# Patient Record
Sex: Male | Born: 1945 | Race: White | Hispanic: No | Marital: Married | Smoking: Former smoker
Health system: Southern US, Community
[De-identification: ages and names within clinical notes are randomized; demographics above are authoritative.]

## PROBLEM LIST (undated history)

## (undated) DIAGNOSIS — Z8719 Personal history of other diseases of the digestive system: Secondary | ICD-10-CM

## (undated) DIAGNOSIS — I714 Abdominal aortic aneurysm, without rupture, unspecified: Secondary | ICD-10-CM

## (undated) DIAGNOSIS — H269 Unspecified cataract: Secondary | ICD-10-CM

## (undated) DIAGNOSIS — Z8639 Personal history of other endocrine, nutritional and metabolic disease: Secondary | ICD-10-CM

## (undated) DIAGNOSIS — R972 Elevated prostate specific antigen [PSA]: Secondary | ICD-10-CM

## (undated) DIAGNOSIS — M159 Polyosteoarthritis, unspecified: Secondary | ICD-10-CM

## (undated) DIAGNOSIS — K219 Gastro-esophageal reflux disease without esophagitis: Secondary | ICD-10-CM

## (undated) DIAGNOSIS — T7840XA Allergy, unspecified, initial encounter: Secondary | ICD-10-CM

## (undated) DIAGNOSIS — I10 Essential (primary) hypertension: Secondary | ICD-10-CM

## (undated) DIAGNOSIS — E785 Hyperlipidemia, unspecified: Secondary | ICD-10-CM

## (undated) DIAGNOSIS — Z9289 Personal history of other medical treatment: Secondary | ICD-10-CM

## (undated) DIAGNOSIS — H409 Unspecified glaucoma: Secondary | ICD-10-CM

## (undated) DIAGNOSIS — D649 Anemia, unspecified: Secondary | ICD-10-CM

## (undated) DIAGNOSIS — I2510 Atherosclerotic heart disease of native coronary artery without angina pectoris: Secondary | ICD-10-CM

## (undated) HISTORY — PX: EYE SURGERY: SHX253

## (undated) HISTORY — PX: CARDIAC CATHETERIZATION: SHX172

## (undated) HISTORY — PX: APPENDECTOMY: SHX54

## (undated) HISTORY — PX: JOINT REPLACEMENT: SHX530

## (undated) HISTORY — PX: COLONOSCOPY: SHX174

## (undated) HISTORY — PX: SHOULDER ARTHROSCOPY: SHX128

## (undated) HISTORY — PX: FRACTURE SURGERY: SHX138

## (undated) HISTORY — DX: Hyperlipidemia, unspecified: E78.5

## (undated) HISTORY — DX: Essential (primary) hypertension: I10

## (undated) HISTORY — PX: SHOULDER ARTHROSCOPY W/ ROTATOR CUFF REPAIR: SHX2400

## (undated) HISTORY — PX: BACK SURGERY: SHX140

## (undated) HISTORY — DX: Unspecified glaucoma: H40.9

## (undated) HISTORY — PX: VASECTOMY: SHX75

## (undated) HISTORY — PX: WISDOM TOOTH EXTRACTION: SHX21

## (undated) HISTORY — PX: SPINE SURGERY: SHX786

## (undated) HISTORY — DX: Unspecified cataract: H26.9

## (undated) HISTORY — DX: Allergy, unspecified, initial encounter: T78.40XA

## (undated) HISTORY — DX: Polyosteoarthritis, unspecified: M15.9

---
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
## 1995-08-16 HISTORY — PX: CERVICAL LAMINECTOMY: SHX94

## 2002-03-25 ENCOUNTER — Ambulatory Visit (HOSPITAL_BASED_OUTPATIENT_CLINIC_OR_DEPARTMENT_OTHER): Admission: RE | Admit: 2002-03-25 | Discharge: 2002-03-25 | Payer: Self-pay | Admitting: Orthopedic Surgery

## 2006-10-08 LAB — HM COLONOSCOPY

## 2008-02-01 ENCOUNTER — Encounter: Payer: Self-pay | Admitting: Family Medicine

## 2008-09-15 HISTORY — PX: ORIF FIBULA FRACTURE: SHX5114

## 2008-09-18 ENCOUNTER — Inpatient Hospital Stay (HOSPITAL_COMMUNITY): Admission: EM | Admit: 2008-09-18 | Discharge: 2008-09-22 | Payer: Self-pay | Admitting: Emergency Medicine

## 2008-10-15 ENCOUNTER — Ambulatory Visit (HOSPITAL_BASED_OUTPATIENT_CLINIC_OR_DEPARTMENT_OTHER): Admission: RE | Admit: 2008-10-15 | Discharge: 2008-10-15 | Payer: Self-pay | Admitting: Plastic Surgery

## 2009-01-26 DIAGNOSIS — E785 Hyperlipidemia, unspecified: Secondary | ICD-10-CM

## 2009-01-26 DIAGNOSIS — I10 Essential (primary) hypertension: Secondary | ICD-10-CM

## 2009-01-26 HISTORY — DX: Hyperlipidemia, unspecified: E78.5

## 2009-01-26 HISTORY — DX: Essential (primary) hypertension: I10

## 2009-03-05 ENCOUNTER — Ambulatory Visit: Payer: Self-pay | Admitting: Family Medicine

## 2009-03-05 LAB — CONVERTED CEMR LAB
ALT: 18 units/L (ref 0–53)
AST: 27 units/L (ref 0–37)
Albumin: 4 g/dL (ref 3.5–5.2)
Alkaline Phosphatase: 109 units/L (ref 39–117)
BUN: 14 mg/dL (ref 6–23)
Basophils Absolute: 0 10*3/uL (ref 0.0–0.1)
Basophils Relative: 0.2 % (ref 0.0–3.0)
Bilirubin Urine: NEGATIVE
Bilirubin, Direct: 0.1 mg/dL (ref 0.0–0.3)
Blood in Urine, dipstick: NEGATIVE
CO2: 28 meq/L (ref 19–32)
Calcium: 9.1 mg/dL (ref 8.4–10.5)
Chloride: 113 meq/L — ABNORMAL HIGH (ref 96–112)
Cholesterol: 178 mg/dL (ref 0–200)
Creatinine, Ser: 1 mg/dL (ref 0.4–1.5)
Eosinophils Absolute: 0.1 10*3/uL (ref 0.0–0.7)
Eosinophils Relative: 1 % (ref 0.0–5.0)
GFR calc non Af Amer: 80.23 mL/min (ref >60)
Glucose, Bld: 114 mg/dL — ABNORMAL HIGH (ref 70–99)
Glucose, Urine, Semiquant: NEGATIVE
HCT: 46.4 % (ref 39.0–52.0)
HDL: 42.2 mg/dL (ref >39.00)
Hemoglobin: 15.9 g/dL (ref 13.0–17.0)
LDL Cholesterol: 113 mg/dL — ABNORMAL HIGH (ref 0–99)
Lymphocytes Relative: 25.3 % (ref 12.0–46.0)
Lymphs Abs: 2.1 10*3/uL (ref 0.7–4.0)
MCHC: 34.4 g/dL (ref 30.0–36.0)
MCV: 90.7 fL (ref 78.0–100.0)
Monocytes Absolute: 0.7 10*3/uL (ref 0.1–1.0)
Monocytes Relative: 8 % (ref 3.0–12.0)
Neutro Abs: 5.3 10*3/uL (ref 1.4–7.7)
Neutrophils Relative %: 65.5 % (ref 43.0–77.0)
Nitrite: NEGATIVE
PSA: 2.01 ng/mL (ref 0.10–4.00)
Platelets: 210 10*3/uL (ref 150.0–400.0)
Potassium: 4.3 meq/L (ref 3.5–5.1)
Protein, U semiquant: NEGATIVE
RBC: 5.11 M/uL (ref 4.22–5.81)
RDW: 13 % (ref 11.5–14.6)
Sodium: 145 meq/L (ref 135–145)
Specific Gravity, Urine: 1.025
TSH: 0.57 microintl units/mL (ref 0.35–5.50)
Total Bilirubin: 0.7 mg/dL (ref 0.3–1.2)
Total CHOL/HDL Ratio: 4
Total Protein: 7.1 g/dL (ref 6.0–8.3)
Triglycerides: 115 mg/dL (ref 0.0–149.0)
Urobilinogen, UA: 0.2
VLDL: 23 mg/dL (ref 0.0–40.0)
WBC Urine, dipstick: NEGATIVE
WBC: 8.2 10*3/uL (ref 4.5–10.5)
pH: 5

## 2009-03-12 ENCOUNTER — Ambulatory Visit: Payer: Self-pay | Admitting: Family Medicine

## 2009-06-11 ENCOUNTER — Ambulatory Visit: Payer: Self-pay | Admitting: Family Medicine

## 2009-06-11 DIAGNOSIS — J019 Acute sinusitis, unspecified: Secondary | ICD-10-CM | POA: Insufficient documentation

## 2009-11-16 ENCOUNTER — Telehealth: Payer: Self-pay | Admitting: Family Medicine

## 2010-01-06 ENCOUNTER — Ambulatory Visit: Payer: Self-pay | Admitting: Family Medicine

## 2010-01-06 DIAGNOSIS — M159 Polyosteoarthritis, unspecified: Secondary | ICD-10-CM

## 2010-01-06 HISTORY — DX: Polyosteoarthritis, unspecified: M15.9

## 2010-03-11 ENCOUNTER — Ambulatory Visit: Payer: Self-pay | Admitting: Family Medicine

## 2010-03-11 DIAGNOSIS — J209 Acute bronchitis, unspecified: Secondary | ICD-10-CM | POA: Insufficient documentation

## 2010-03-19 ENCOUNTER — Ambulatory Visit: Payer: Self-pay | Admitting: Family Medicine

## 2010-03-19 LAB — CONVERTED CEMR LAB
ALT: 29 units/L (ref 0–53)
AST: 30 units/L (ref 0–37)
Albumin: 4.4 g/dL (ref 3.5–5.2)
Alkaline Phosphatase: 83 units/L (ref 39–117)
BUN: 15 mg/dL (ref 6–23)
Basophils Absolute: 0.1 10*3/uL (ref 0.0–0.1)
Basophils Relative: 0.9 % (ref 0.0–3.0)
Bilirubin Urine: NEGATIVE
Bilirubin, Direct: 0.1 mg/dL (ref 0.0–0.3)
Blood in Urine, dipstick: NEGATIVE
CO2: 31 meq/L (ref 19–32)
Calcium: 9.7 mg/dL (ref 8.4–10.5)
Chloride: 108 meq/L (ref 96–112)
Cholesterol: 187 mg/dL (ref 0–200)
Creatinine, Ser: 0.9 mg/dL (ref 0.4–1.5)
Eosinophils Absolute: 0.1 10*3/uL (ref 0.0–0.7)
Eosinophils Relative: 1.3 % (ref 0.0–5.0)
GFR calc non Af Amer: 91.47 mL/min (ref >60)
Glucose, Bld: 114 mg/dL — ABNORMAL HIGH (ref 70–99)
Glucose, Urine, Semiquant: NEGATIVE
HCT: 46.6 % (ref 39.0–52.0)
HDL: 41.1 mg/dL (ref >39.00)
Hemoglobin: 16 g/dL (ref 13.0–17.0)
Ketones, urine, test strip: NEGATIVE
LDL Cholesterol: 114 mg/dL — ABNORMAL HIGH (ref 0–99)
Lymphocytes Relative: 25.7 % (ref 12.0–46.0)
Lymphs Abs: 2.3 10*3/uL (ref 0.7–4.0)
MCHC: 34.4 g/dL (ref 30.0–36.0)
MCV: 93.3 fL (ref 78.0–100.0)
Monocytes Absolute: 0.7 10*3/uL (ref 0.1–1.0)
Monocytes Relative: 8 % (ref 3.0–12.0)
Neutro Abs: 5.6 10*3/uL (ref 1.4–7.7)
Neutrophils Relative %: 64.1 % (ref 43.0–77.0)
Nitrite: NEGATIVE
PSA: 2.78 ng/mL (ref 0.10–4.00)
Platelets: 231 10*3/uL (ref 150.0–400.0)
Potassium: 4.2 meq/L (ref 3.5–5.1)
Protein, U semiquant: NEGATIVE
RBC: 4.99 M/uL (ref 4.22–5.81)
RDW: 13.5 % (ref 11.5–14.6)
Sodium: 147 meq/L — ABNORMAL HIGH (ref 135–145)
Specific Gravity, Urine: 1.025
TSH: 0.79 microintl units/mL (ref 0.35–5.50)
Total Bilirubin: 0.7 mg/dL (ref 0.3–1.2)
Total CHOL/HDL Ratio: 5
Total Protein: 7 g/dL (ref 6.0–8.3)
Triglycerides: 162 mg/dL — ABNORMAL HIGH (ref 0.0–149.0)
Urobilinogen, UA: 0.2
VLDL: 32.4 mg/dL (ref 0.0–40.0)
WBC Urine, dipstick: NEGATIVE
WBC: 8.8 10*3/uL (ref 4.5–10.5)
pH: 5

## 2010-03-26 ENCOUNTER — Ambulatory Visit: Payer: Self-pay | Admitting: Family Medicine

## 2010-07-12 ENCOUNTER — Ambulatory Visit: Payer: Self-pay | Admitting: Family Medicine

## 2010-07-13 ENCOUNTER — Ambulatory Visit: Payer: Self-pay | Admitting: Family Medicine

## 2010-07-14 ENCOUNTER — Telehealth: Payer: Self-pay | Admitting: Family Medicine

## 2010-09-14 NOTE — Letter (Signed)
Summary: Records from [facility] 2009  Records from [facility] 2009   Imported By: Maryln Gottron 01/30/2009 09:26:40  _____________________________________________________________________  External Attachment:    Type:   Image     Comment:   External Document

## 2010-09-14 NOTE — Assessment & Plan Note (Signed)
Summary: severe wrist pain/dm   Vital Signs:  Patient profile:   65 year old male Weight:      207 pounds Temp:     98.3 degrees F oral BP sitting:   138 / 80  (left arm) Cuff size:   large  Vitals Entered By: Sid Falcon LPN (July 12, 2010 3:03 PM)  History of Present Illness: Patient seen with left wrist pain for several months now going back about August. No injury. Right-hand-dominant. Pain mostly along radial aspect of wrist with occasional swelling. Pain worse with use of hand. No repetitive activities. Occasional tingling sensation thumb and index finger. No volar wrist pain.  Pain is more a soreness.  Allergies: 1)  Penicillin V Potassium (Penicillin V Potassium) 2)  Steroids  Past History:  Past Medical History: Last updated: 01/26/2009 Hyperlipidemia Hypertension Pre-diabetes by past labs Chronic intermittent low back pain Slghtly atypical pigmented lesion left upper chest wall PMH reviewed for relevance  Physical Exam  General:  Well-developed,well-nourished,in no acute distress; alert,appropriate and cooperative throughout examination Lungs:  Normal respiratory effort, chest expands symmetrically. Lungs are clear to auscultation, no crackles or wheezes. Heart:  normal rate and regular rhythm.   Extremities:  patient has no visible swelling left wrist. He has some mild tenderness to palpation along the abductor tendon of left thumb. Full range of motion wrists. No navicular tenderness. Normal distal pulses. Does have small rounded ?cystic type swelling at distal end of radius. Neurologic:  full strength hand and throughout Upper extremity.   Impression & Recommendations:  Problem # 1:  WRIST PAIN (ICD-719.43) suspect tendonitis.  Given duration of symptoms rec x-rays.  No relief with NSAIDs and we offered corticosteroid injection. Risks and benefits discussed and pt consented to steroid injection of L abductor tendon sheath.  Inj 20 mg depomedrol using 25  gauge 5/8 needle without difficulty.  Pt will ice. Orders: T-Wrist Comp Left Min 3 Views (73110TC) Injection, Tendon / Ligament (02725)  Complete Medication List: 1)  Prinzide 20-12.5 Mg Tabs (Lisinopril-hydrochlorothiazide) .... 1/2 daily 2)  Zocor 80 Mg Tabs (Simvastatin) .... Once daily 3)  Aspirin 325 Mg Tabs (Aspirin) .... Once daily 4)  Oxycodone Hcl 5 Mg Tabs (Oxycodone hcl) .... One by mouth q4-6 hours as needed pain 5)  Soma 350 Mg Tabs (Carisoprodol) .... One by mouth q 8 hours as needed 6)  Tramadol Hcl 50 Mg Tabs (Tramadol hcl) .[name].. 1-2 by mouth q 6 hours as needed pain Prescriptions: OXYCODONE HCL 5 MG TABS (OXYCODONE HCL) one by mouth q4-6 hours as needed pain  #60 x 0   Entered and Authorized by:   Evelena Peat MD   Signed by:   Evelena Peat MD on 07/12/2010   Method used:   Print then Give to Patient   RxID:   [id number]    Orders Added: 1)  T-Wrist Comp Left Min 3 Views [73110TC] 2)  Injection, Tendon / Ligament [20550]   Immunization History:  Influenza Immunization History:    Influenza:  historical (06/15/2010)   Immunization History:  Influenza Immunization History:    Influenza:  Historical (06/15/2010)

## 2010-09-14 NOTE — Assessment & Plan Note (Signed)
Summary: ARTHRITIS CONCERNS / KNEE PAIN // RS   Vital Signs:  Patient profile:   65 year old male Temp:     98.7 degrees F Pulse rate:   72 / minute BP sitting:   130 / 90  History of Present Illness: Hx osteoarthritis. prior steroid injection has helped in past-hip injected by ortho.  Knee and hip pain mostly. Last Wed R knee sharp pain rolling over in bed.  Advil 8/day with some relief. Tylenol without relief.  Waking at night.   Hx ultram without relief.  Allergies: 1)  Penicillin V Potassium (Penicillin V Potassium) 2)  Steroids  Past History:  Past Medical History: Last updated: 01/26/2009 Hyperlipidemia Hypertension Pre-diabetes by past labs Chronic intermittent low back pain Slghtly atypical pigmented lesion left upper chest wall  Past Surgical History: Last updated: 03/12/2009 Appendectomy  age 59 Cervical laminectomy  1997 C3-4 Rotator cuff repair left 2004, right 2009 left spiral tib-fib fx ORIF, compartment syndrome PMH reviewed for relevance  Physical Exam  General:  Well-developed,well-nourished,in no acute distress; alert,appropriate and cooperative throughout examination Mouth:  Oral mucosa and oropharynx without lesions or exudates.  Teeth in good repair. Lungs:  Normal respiratory effort, chest expands symmetrically. Lungs are clear to auscultation, no crackles or wheezes. Heart:  Normal rate and regular rhythm. S1 and S2 normal without gallop, murmur, click, rub or other extra sounds. Extremities:  full ROM knees and hips.  No effusion, erythema, or warmth.   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  Rec exercises such as water or cycling.  Trial of Meloxicam 15 mg daily. His updated medication list for this problem includes:    Aspirin 325 Mg Tabs (Aspirin) ..... Once daily    Oxycodone Hcl 5 Mg Tabs (Oxycodone hcl) ..... One by mouth  q4-6 hours as needed pain    Meloxicam 15 Mg Tabs (Meloxicam) ..... One by mouth once daily  Complete Medication List: 1)  Prinzide 20-12.5 Mg Tabs (Lisinopril-hydrochlorothiazide) .... Once daily 2)  Zocor 80 Mg Tabs (Simvastatin) .... Once daily 3)  Aspirin 325 Mg Tabs (Aspirin) .... Once daily 4)  Oxycodone Hcl 5 Mg Tabs (Oxycodone hcl) .... One by mouth q4-6 hours as needed pain 5)  Soma 350 Mg Tabs (Carisoprodol) .... One by mouth q 8 hours as needed 6)  Azithromycin 250 Mg Tabs (Azithromycin) .... 2 by mouth today then one by mouth once daily for 4 days. 7)  Hydrocodone-homatropine 5-1.5 Mg/9ml Syrp (Hydrocodone-homatropine) .... One tsp by mouth q 4-6 hours as needed cough 8)  Meloxicam 15 Mg Tabs (Meloxicam) .... One by mouth once daily  Patient Instructions: 1)  Exercise with cycling or water exercises. Prescriptions: MELOXICAM 15 MG TABS (MELOXICAM) one by mouth once daily  #30 x 5   Entered and Authorized by:   Evelena Peat MD   Signed by:   Evelena Peat MD on 01/06/2010   Method used:   Electronically to        [facility]  [phone]* (retail)       [address] [place], [place]  [address]       Ph: [phone] or [phone]       Fax: [phone]   RxID:   [id number]

## 2010-09-14 NOTE — Assessment & Plan Note (Signed)
Summary: cpx//ccm   Vital Signs:  Patient profile:   65 year old male Height:      73 inches Weight:      216 pounds BMI:     28.60 Temp:     98.4 degrees F oral Pulse rate:   72 / minute Pulse rhythm:   regular Resp:     12 per minute BP sitting:   130 / 90  (left arm) Cuff size:   regular  Vitals Entered By: Sid Falcon LPN (March 12, 2009 8:58 AM) CC: Previous [place] pt, to establish, CPX, labs done   History of Present Illness: Patient seen to establish care. He has history of hypertension and hyperlipidemia. Complicated left lower extremity fracture in February after falling on the ice this was a spiral type fracture of the fibula and tibia and he developed a compartment syndrome.  He had multiple surgeries and has had slow recovery. He's had extensive rehabilitation. Meds are reviewed and he has been compliant with these. Since his injuries had some weight gain which he attributes to less activity.  Colonoscopy in 2008. Last tetanus 2005. Pneumovax was given February of this year. Patient also has history of chronic prediabetes from previous labs. He has some chronic arthritis problems and very infrequently takes Percocet and soma in the past and requests refill.  Allergies: 1)  Penicillin V Potassium (Penicillin V Potassium) 2)  Steroids  Past History:  Past Medical History: Last updated: 01/26/2009 Hyperlipidemia Hypertension Pre-diabetes by past labs Chronic intermittent low back pain Slghtly atypical pigmented lesion left upper chest wall  Family History: Last updated: 03/12/2009 Family History Hypertension Family History of Arthritis Rheumatoid sister Family History of Stroke F 1st degree relative <60  sister  Social History: Last updated: 03/12/2009 Ex-smoker Occupation: Married Alcohol use-no  Past Surgical History: Appendectomy  age 4 Cervical laminectomy  1997 C3-4 Rotator cuff repair left 2004, right 2009 left spiral tib-fib fx ORIF,  compartment syndrome  Family History: Family History Hypertension Family History of Arthritis Rheumatoid sister Family History of Stroke F 1st degree relative <60  sister  Social History: Ex-smoker Occupation: Married Alcohol use-no Occupation:  employed  Review of Systems       The patient complains of weight gain.  The patient denies anorexia, fever, weight loss, vision loss, decreased hearing, hoarseness, chest pain, syncope, dyspnea on exertion, peripheral edema, prolonged cough, headaches, hemoptysis, abdominal pain, melena, hematochezia, severe indigestion/heartburn, hematuria, incontinence, suspicious skin lesions, transient blindness, difficulty walking, depression, unusual weight change, abnormal bleeding, enlarged lymph nodes, and testicular masses.    Physical Exam  General:  Well-developed,well-nourished,in no acute distress; alert,appropriate and cooperative throughout examination Head:  Normocephalic and atraumatic without obvious abnormalities. No apparent alopecia or balding. Eyes:  No corneal or conjunctival inflammation noted. EOMI. Perrla. Funduscopic exam benign, without hemorrhages, exudates or papilledema. Vision grossly normal. Ears:  External ear exam shows no significant lesions or deformities.  Otoscopic examination reveals clear canals, tympanic membranes are intact bilaterally without bulging, retraction, inflammation or discharge. Hearing is grossly normal bilaterally. Mouth:  Oral mucosa and oropharynx without lesions or exudates.  Teeth in good repair. Neck:  No deformities, masses, or tenderness noted. Lungs:  Normal respiratory effort, chest expands symmetrically. Lungs are clear to auscultation, no crackles or wheezes. Heart:  Normal rate and regular rhythm. S1 and S2 normal without gallop, murmur, click, rub or other extra sounds. Abdomen:  Bowel sounds positive,abdomen soft and non-tender without masses, organomegaly or hernias noted. Rectal:  No  external abnormalities noted. Normal sphincter tone. No rectal masses or tenderness. Prostate:  Prostate gland firm and smooth, no enlargement, nodularity, tenderness, mass, asymmetry or induration. Extremities:  he has significant scars left lower extremity from prior surgery. He has some chronic edema left lower extremity compared to right since his surgery. No calf tenderness. Minimally decreased range of motion left ankle compared to right Neurologic:  No cranial nerve deficits noted. Station and gait are normal. Plantar reflexes are down-going bilaterally. DTRs are symmetrical throughout. Sensory, motor and coordinative functions appear intact. Skin:  Intact without suspicious lesions or rashes Psych:  Cognition and judgment appear intact. Alert and cooperative with normal attention span and concentration. No apparent delusions, illusions, hallucinations   Impression & Recommendations:  Problem # 1:  Preventive Health Care (ICD-V70.0) labs reviewed with patient. He has prediabetes. Lipids are slightly worse this year compared to last. Immunizations up to date. No prior history of zoster vaccine we discussed this and he is still considering. Work on weight loss and establish a more regular exercise. Refilled meds for 1 year.  Complete Medication List: 1)  Prinzide 20-12.5 Mg Tabs (Lisinopril-hydrochlorothiazide) .... Once daily 2)  Zocor 80 Mg Tabs (Simvastatin) .... Once daily 3)  Aspirin 325 Mg Tabs (Aspirin) .... Once daily 4)  Oxycodone Hcl 5 Mg Tabs (Oxycodone hcl) .... One by mouth q4-6 hours as needed pain 5)  Soma 350 Mg Tabs (Carisoprodol) .... One by mouth q 8 hours as needed  Patient Instructions: 1)  It is important that you exercise reguarly at least 20 minutes 5 times a week. If you develop chest pain, have severe difficulty breathing, or feel very tired, stop exercising immediately and seek medical attention.  2)  You need to lose weight. Consider a lower calorie diet and  regular exercise.  Prescriptions: SOMA 350 MG TABS (CARISOPRODOL) one by mouth q 8 hours as needed  #60 x 0   Entered and Authorized by:   Evelena Peat MD   Signed by:   Evelena Peat MD on 03/12/2009   Method used:   Print then Give to Patient   RxID:   [id number] OXYCODONE HCL 5 MG TABS (OXYCODONE HCL) one by mouth q4-6 hours as needed pain  #60 x 0   Entered and Authorized by:   Evelena Peat MD   Signed by:   Evelena Peat MD on 03/12/2009   Method used:   Print then Give to Patient   RxID:   [id number] ZOCOR 80 MG TABS (SIMVASTATIN) once daily  #90 x 3   Entered and Authorized by:   Evelena Peat MD   Signed by:   Evelena Peat MD on 03/12/2009   Method used:   Electronically to        [facility]* (mail-order)             ,          Ph: [phone]       Fax: [phone]   RxID:   [id number] PRINZIDE 20-12.5 MG TABS (LISINOPRIL-HYDROCHLOROTHIAZIDE) once daily  #90 x 3   Entered and Authorized by:   Evelena Peat MD   Signed by:   Evelena Peat MD on 03/12/2009   Method used:   Electronically to        [facility]* (mail-order)             ,          Ph: [phone]       Fax: [phone]  RxID:   [id number]

## 2010-09-14 NOTE — Assessment & Plan Note (Signed)
Summary: cpx//ccm   Vital Signs:  Patient profile:   65 year old male Height:      72 inches Weight:      207 pounds BMI:     28.18 Temp:     98.5 degrees F oral Pulse rate:   68 / minute Resp:     16 per minute BP sitting:   140 / 92  (left arm) Cuff size:   regular  Vitals Entered By: Duard Brady LPN (March 26, 2010 8:48 AM)  Nutrition Counseling: Patient's BMI is greater than 25 and therefore counseled on weight management options. CC: cpx - doing ok, Hypertension Management Is Patient Diabetic? No   History of Present Illness: Here for CPE.  PMH, SH, AND FH all reviewed. Colonoscopy and tetanus up to date. Undecided about Zostavax at this time.  Has osteoarthritis mostly hips.  Has not had very good control with Meloxicam or Tylenol. Rarely takes oxycodone.  Prior hx injection steroids by ortho without much improvement. Early AM stiffness.  No recent injury. Pain is moderate severity.  Would like to explore other options' for pain control.  Hypertension History:      He denies headache, chest pain, palpitations, dyspnea with exertion, orthopnea, PND, peripheral edema, visual symptoms, neurologic problems, and syncope.  Further comments include: BP well controlled by home readings.        Positive major cardiovascular risk factors include male age 35 years old or older, hyperlipidemia, and hypertension.  Negative major cardiovascular risk factors include non-tobacco-user status.     Preventive Screening-Counseling & Management  Alcohol-Tobacco     Smoking Status: never  Clinical Review Panels:  Prevention   Last Colonoscopy:  Done (09/16/2006)   Last PSA:  2.78 (03/19/2010)  Immunizations   Last Tetanus Booster:  Historical (08/16/2003)  Lipid Management   Cholesterol:  187 (03/19/2010)   LDL (bad choesterol):  114 (03/19/2010)   HDL (good cholesterol):  41.10 (03/19/2010)  Diabetes Management   Creatinine:  0.9 (03/19/2010)  CBC   WBC:  8.8  (03/19/2010)   RBC:  4.99 (03/19/2010)   Hgb:  16.0 (03/19/2010)   Hct:  46.6 (03/19/2010)   Platelets:  231.0 (03/19/2010)   MCV  93.3 (03/19/2010)   MCHC  34.4 (03/19/2010)   RDW  13.5 (03/19/2010)   PMN:  64.1 (03/19/2010)   Lymphs:  25.7 (03/19/2010)   Monos:  8.0 (03/19/2010)   Eosinophils:  1.3 (03/19/2010)   Basophil:  0.9 (03/19/2010)  Complete Metabolic Panel   Glucose:  114 (03/19/2010)   Sodium:  147 (03/19/2010)   Potassium:  4.2 (03/19/2010)   Chloride:  108 (03/19/2010)   CO2:  31 (03/19/2010)   BUN:  15 (03/19/2010)   Creatinine:  0.9 (03/19/2010)   Albumin:  4.4 (03/19/2010)   Total Protein:  7.0 (03/19/2010)   Calcium:  9.7 (03/19/2010)   Total Bili:  0.7 (03/19/2010)   Alk Phos:  83 (03/19/2010)   SGPT (ALT):  29 (03/19/2010)   SGOT (AST):  30 (03/19/2010)   Allergies: 1)  Penicillin V Potassium (Penicillin V Potassium) 2)  Steroids  Past History:  Past Medical History: Last updated: 01/26/2009 Hyperlipidemia Hypertension Pre-diabetes by past labs Chronic intermittent low back pain Slghtly atypical pigmented lesion left upper chest wall  Past Surgical History: Last updated: 03/12/2009 Appendectomy  age 41 Cervical laminectomy  1997 C3-4 Rotator cuff repair left 2004, right 2009 left spiral tib-fib fx ORIF, compartment syndrome  Family History: Last updated: 03/26/2010 Family History Hypertension parents,  sisters Family History of Arthritis Rheumatoid sister Family History of Stroke F 1st degree relative <60  sister  Social History: Last updated: 03/12/2009 Ex-smoker Occupation: Married Alcohol use-no  Risk Factors: Smoking Status: never (03/26/2010) PMH-FH-SH reviewed for relevance  Family History: Family History Hypertension parents, sisters Family History of Arthritis Rheumatoid sister Family History of Stroke F 1st degree relative <60  sister  Social History: Smoking Status:  never  Review of Systems  The patient  denies anorexia, fever, weight loss, weight gain, vision loss, decreased hearing, hoarseness, chest pain, syncope, dyspnea on exertion, peripheral edema, prolonged cough, headaches, hemoptysis, abdominal pain, melena, hematochezia, severe indigestion/heartburn, hematuria, incontinence, genital sores, muscle weakness, suspicious skin lesions, transient blindness, difficulty walking, depression, unusual weight change, enlarged lymph nodes, and testicular masses.     Physical Exam  General:  Well-developed,well-nourished,in no acute distress; alert,appropriate and cooperative throughout examination Head:  Normocephalic and atraumatic without obvious abnormalities. No apparent alopecia or balding. Eyes:  No corneal or conjunctival inflammation noted. EOMI. Perrla. Funduscopic exam benign, without hemorrhages, exudates or papilledema. Vision grossly normal. Ears:  External ear exam shows no significant lesions or deformities.  Otoscopic examination reveals clear canals, tympanic membranes are intact bilaterally without bulging, retraction, inflammation or discharge. Hearing is grossly normal bilaterally. Mouth:  Oral mucosa and oropharynx without lesions or exudates.  Teeth in good repair. Neck:  No deformities, masses, or tenderness noted. Lungs:  Normal respiratory effort, chest expands symmetrically. Lungs are clear to auscultation, no crackles or wheezes. Heart:  normal rate, regular rhythm, and no gallop.   Abdomen:  Bowel sounds positive,abdomen soft and non-tender without masses, organomegaly or hernias noted. Rectal:  No external abnormalities noted. Normal sphincter tone. No rectal masses or tenderness. Prostate:  Prostate gland firm and smooth, no enlargement, nodularity, tenderness, mass, asymmetry or induration. Extremities:  No clubbing, cyanosis, edema, or deformity noted with normal full range of motion of all joints.   Neurologic:  alert & oriented X3, cranial nerves II-XII intact, and  gait normal.   Skin:  Intact without suspicious lesions or rashes Cervical Nodes:  No lymphadenopathy noted Psych:  normally interactive, good eye contact, not anxious appearing, and not depressed appearing.     Impression & Recommendations:  Problem # 1:  ROUTINE GENERAL MEDICAL [email or web address]  CARE FACL (ICD-V70.0) Labs reviewed.  Consider Zostavax.  Pneumovax by next year.  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  Oxycodone refilled which he uses sparingly. His updated medication list for this problem includes:    Aspirin 325 Mg Tabs (Aspirin) ..... Once daily    Oxycodone Hcl 5 Mg Tabs (Oxycodone hcl) ..... One by mouth q4-6 hours as needed pain    Tramadol Hcl 50 Mg Tabs (Tramadol hcl) .[name]... 1-2 by mouth q 6 hours as needed pain  Problem # 3:  HYPERTENSION (ICD-401.9) Assessment: Unchanged refilled meds. His updated medication list for this problem includes:    Prinzide 20-12.5 Mg Tabs (Lisinopril-hydrochlorothiazide) .[name]... 1/2 daily  Complete Medication List: 1)  Prinzide 20-12.5 Mg Tabs (Lisinopril-hydrochlorothiazide) .... 1/2 daily 2)  Zocor 80 Mg Tabs (Simvastatin) .... Once daily 3)  Aspirin 325 Mg Tabs (Aspirin) .... Once daily 4)  Oxycodone Hcl 5 Mg Tabs (Oxycodone hcl) .... One by mouth q4-6 hours as needed pain 5)  Soma 350 Mg Tabs (Carisoprodol) .... One by mouth q 8 hours as needed 6)  Tramadol Hcl 50 Mg Tabs (Tramadol hcl) .[name].. 1-2 by mouth q 6 hours as needed pain  Hypertension Assessment/Plan:      The patient's hypertensive risk group is category B: At least one risk factor (excluding diabetes) with no target organ damage.  His calculated 10 year risk of coronary heart disease is 22 %.  Today's blood pressure is 140/92.    Patient Instructions: 1)  Please schedule a follow-up appointment in 6 months .  2)  It is important that you exercise reguarly at least 20 minutes 5 times a week. If you develop chest pain, have  severe difficulty breathing, or feel very tired, stop exercising immediately and seek medical attention.  Prescriptions: SOMA 350 MG TABS (CARISOPRODOL) one by mouth q 8 hours as needed  #60 x 0   Entered and Authorized by:   Evelena Peat MD   Signed by:   Evelena Peat MD on 03/26/2010   Method used:   Print then Give to Patient   RxID:   [id number] OXYCODONE HCL 5 MG TABS (OXYCODONE HCL) one by mouth q4-6 hours as needed pain  #60 x 0   Entered and Authorized by:   Evelena Peat MD   Signed by:   Evelena Peat MD on 03/26/2010   Method used:   Print then Give to Patient   RxID:   [id number] ZOCOR 80 MG TABS (SIMVASTATIN) once daily  #90 x 3   Entered and Authorized by:   Evelena Peat MD   Signed by:   Evelena Peat MD on 03/26/2010   Method used:   Faxed to ...       [facility] (mail-order)             , [place]         Ph:        Fax: [phone]   RxID:   [id number] PRINZIDE 20-12.5 MG TABS (LISINOPRIL-HYDROCHLOROTHIAZIDE) 1/2 daily  #90 x 3   Entered and Authorized by:   Evelena Peat MD   Signed by:   Evelena Peat MD on 03/26/2010   Method used:   Faxed to ...       [facility] (mail-order)             , [place]         Ph:        Fax: [phone]   RxID:   [id number] TRAMADOL HCL 50 MG TABS (TRAMADOL HCL) 1-2 by mouth q 6 hours as needed pain  #120 x 3   Entered and Authorized by:   Evelena Peat MD   Signed by:   Evelena Peat MD on 03/26/2010   Method used:   Electronically to        [facility]  [phone]* (retail)       [address] [place], [place]  [address]       Ph: [phone] or [phone]       Fax: [phone]   RxID:   [id number]

## 2010-09-14 NOTE — Assessment & Plan Note (Signed)
Summary: COUGH AND CONGESTION//SLM   Vital Signs:  Patient profile:   65 year old male Temp:     98.0 degrees F oral BP sitting:   160 / 88  (left arm) Cuff size:   regular  Vitals Entered By: Sid Falcon LPN (March 11, 2010 9:46 AM)  Serial Vital Signs/Assessments:  Time      Position  BP       Pulse  Resp  Temp     By                     150/80                         Evelena Peat MD  CC: cough, congestion   History of Present Illness: Acute illness. Patient just returned from traveling in [place]. One week ago onset of cough, wheezing and chest congestion. Occasional sweats and chills but no definite fever. Severe cough and night. Decongestant without much improvement. Patient denies any sore throat, nausea, vomiting, or diarrhea.  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  Otoscopic examination reveals clear canals, tympanic membranes are intact bilaterally without bulging, retraction, inflammation or discharge. Hearing is grossly normal bilaterally. Mouth:  Oral mucosa and oropharynx without lesions or exudates.  Teeth in good repair. Neck:  No deformities, masses, or tenderness noted. Lungs:  Normal respiratory effort, chest expands symmetrically. Lungs are clear to auscultation, no crackles or wheezes. Heart:  Normal rate and regular rhythm. S1 and S2 normal without gallop, murmur, click, rub or other extra sounds.   Impression & Recommendations:  Problem  # 1:  ACUTE BRONCHITIS (ICD-466.0)  His updated medication list for this problem includes:    Azithromycin 250 Mg Tabs (Azithromycin) .[name]... 2 by mouth today then one by mouth once daily for 4 days.    Hydrocodone-homatropine 5-1.5 Mg/36ml Syrp (Hydrocodone-homatropine) ..... One tsp by mouth q 4-6 hours as needed cough  Complete Medication List: 1)  Prinzide 20-12.5 Mg Tabs (Lisinopril-hydrochlorothiazide) .... Once daily 2)  Zocor 80 Mg Tabs (Simvastatin) .... Once daily 3)  Aspirin 325 Mg Tabs (Aspirin) .... Once daily 4)  Oxycodone Hcl 5 Mg Tabs (Oxycodone hcl) .... One by mouth q4-6 hours as needed pain 5)  Soma 350 Mg Tabs (Carisoprodol) .... One by mouth q 8 hours as needed 6)  Azithromycin 250 Mg Tabs (Azithromycin) .... 2 by mouth today then one by mouth once daily for 4 days. 7)  Hydrocodone-homatropine 5-1.5 Mg/25ml Syrp (Hydrocodone-homatropine) .... One tsp by mouth q 4-6 hours as needed cough 8)  Meloxicam 15 Mg Tabs (Meloxicam) .... One by mouth once daily  Patient Instructions: 1)  Acute Bronchitis symptoms for less then 10 days are not  helped by antibiotics. Take over the counter cough medications. Call if no improvement in 5-7 days, sooner if increasing cough, fever, or new symptoms ( shortness of breath, chest pain) .  Prescriptions: HYDROCODONE-HOMATROPINE 5-1.5 MG/5ML SYRP (HYDROCODONE-HOMATROPINE) one tsp by mouth q 4-6 hours as needed cough  #  120 ml x 0   Entered and Authorized by:   Evelena Peat MD   Signed by:   Evelena Peat MD on 03/11/2010   Method used:   Print then Give to Patient   RxID:   [id number] AZITHROMYCIN 250 MG TABS (AZITHROMYCIN) 2 by mouth today then one by mouth once daily for 4 days.  #6 x 0   Entered and Authorized by:   Evelena Peat MD   Signed by:   Evelena Peat MD on 03/11/2010   Method used:   Print then Give to Patient   RxID:   [id number]

## 2010-09-14 NOTE — Progress Notes (Signed)
Summary: XRAY RESULTS request  Phone Note Call from Patient Call back at Home Phone [phone]   Caller: Patient Call For: Evelena Peat MD Summary of Call: PT WOULD LIKE XRAY RESULTS Initial call taken by: Heron Sabins,  July 14, 2010 12:49 PM  Follow-up for Phone Call        Informed pt of neg x-ray.  Pt report the injection received yesterday has done nothing for the pain", questioning what's next?  Give this through next week and let us know if no better by then.  Make sure pt icing three times daily. Follow-up by: Sid Falcon LPN,  July 14, 2010 1:40 PM  Additional Follow-up for Phone Call Additional follow up Details #1::        Pt informed Additional Follow-up by: Sid Falcon LPN,  July 14, 2010 5:01 PM

## 2010-09-14 NOTE — Assessment & Plan Note (Signed)
Summary: HEAD AND CHEST CONGESTION//CCM   Vital Signs:  Patient profile:   65 year old male Temp:     99.1 degrees F oral BP sitting:   150 / 100  (left arm) Cuff size:   regular  Vitals Entered By: Sid Falcon LPN (June 11, 2009 8:20 AM) CC: Headache, chest congestion, cough X 4 days   History of Present Illness: Acute visit. Onset over week ago with nasal congestion. He has productive cough at this time and sweats and possible low-grade fever. Progressive frontal sinus headache and bilateral frontal sinus pressure. Yellowish nasal discharge. Has taken DayQuil and NyQuil without much relief. Patient is nonsmoker. Allergic to penicillin.  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  Otoscopic examination reveals clear canals, tympanic membranes are intact bilaterally without bulging, retraction, inflammation or discharge. Hearing is grossly normal bilaterally. Nose:  minimal clear nasal mucus. Mouth:  Oral mucosa and oropharynx without lesions or exudates.  Teeth in good repair. Neck:  No deformities, masses, or tenderness noted. Lungs:  Normal respiratory effort, chest expands symmetrically. Lungs are clear to auscultation, no crackles or wheezes. Heart:  Normal rate and regular rhythm. S1 and S2 normal without gallop, murmur, click, rub or other extra sounds.   Impression & Recommendations:  Problem # 1:  SINUSITIS, ACUTE (ICD-461.9)  Start  antibiotics and cough suppressant.  His updated medication list for this problem includes:    Azithromycin 250 Mg Tabs (Azithromycin) .[name]... 2 by mouth today then one by mouth once daily for 4 days.    Hydrocodone-homatropine 5-1.5 Mg/36ml Syrp (Hydrocodone-homatropine) ..... One tsp by mouth q 4-6 hours as needed cough  Complete Medication List: 1)  Prinzide 20-12.5 Mg Tabs (Lisinopril-hydrochlorothiazide) .... Once daily 2)  Zocor 80 Mg Tabs (Simvastatin) .... Once daily 3)  Aspirin 325 Mg Tabs (Aspirin) .... Once daily 4)  Oxycodone Hcl 5 Mg Tabs (Oxycodone hcl) .... One by mouth q4-6 hours as needed pain 5)  Soma 350 Mg Tabs (Carisoprodol) .... One by mouth q 8 hours as needed 6)  Azithromycin 250 Mg Tabs (Azithromycin) .... 2 by mouth today then one by mouth once daily for 4 days. 7)  Hydrocodone-homatropine 5-1.5 Mg/66ml Syrp (Hydrocodone-homatropine) .... One tsp by mouth q 4-6 hours as needed cough  Patient Instructions: 1)  Acute sinusitis symptoms for less than 10 days are not helped by antibiotics. Use warm moist compresses, and over the counter decongestants( only as directed). Call if no improvement in 5-7 days, sooner if increasing pain, fever, or new symptoms.  Prescriptions: HYDROCODONE-HOMATROPINE 5-1.5 MG/5ML SYRP (HYDROCODONE-HOMATROPINE) one tsp by mouth q 4-6 hours as needed cough  #120 ml x 0   Entered and Authorized by:   Evelena Peat MD   Signed by:   Evelena Peat MD on 06/11/2009   Method used:   Print then Give to Patient   RxID:   [id number] AZITHROMYCIN 250 MG TABS (AZITHROMYCIN) 2 by mouth today then one by mouth once daily for 4 days.  #6 x 0   Entered and  Authorized by:   Evelena Peat MD   Signed by:   Evelena Peat MD on 06/11/2009   Method used:   Print then Give to Patient   RxID:   [id number]

## 2010-09-14 NOTE — Progress Notes (Signed)
Summary: Pt req refill of Oxycodone, ready for pick-up  Phone Note Call from Patient Call back at Home Phone [phone]   Caller: Patient Summary of Call: Pt is req a refill on Oxycodone. Pls call in to [facility] on [address].   Initial call taken by: Lucy Antigua,  November 16, 2009 8:44 AM  Follow-up for Phone Call        One tab by mouth every 6 hours as needed pain.  Last filled 03/12/09 #60 with 0 RF Follow-up by: Sid Falcon LPN,  November 16, 2009 9:17 AM  Additional Follow-up for Phone Call Additional follow up Details #1::        He uses this very infrequently.  Will refill once. Additional Follow-up by: Evelena Peat MD,  November 16, 2009 9:34 AM    Additional Follow-up for Phone Call Additional follow up Details #2::    Pt informed Rx ready for pick-up Follow-up by: Sid Falcon LPN,  November 16, 2009 9:38 AM  Prescriptions: OXYCODONE HCL 5 MG TABS (OXYCODONE HCL) one by mouth q4-6 hours as needed pain  #60 x 0   Entered and Authorized by:   Evelena Peat MD   Signed by:   Evelena Peat MD on 11/16/2009   Method used:   Print then Give to Patient   RxID:   [id number]

## 2010-10-18 ENCOUNTER — Ambulatory Visit (INDEPENDENT_AMBULATORY_CARE_PROVIDER_SITE_OTHER): Payer: BC Managed Care – PPO | Admitting: Family Medicine

## 2010-10-18 ENCOUNTER — Ambulatory Visit: Payer: Self-pay | Admitting: Family Medicine

## 2010-10-18 ENCOUNTER — Encounter: Payer: Self-pay | Admitting: Family Medicine

## 2010-10-18 VITALS — BP 138/84 | Temp 98.6°F | Ht 72.5 in | Wt 212.0 lb

## 2010-10-18 DIAGNOSIS — J45909 Unspecified asthma, uncomplicated: Secondary | ICD-10-CM

## 2010-10-18 MED ORDER — METHYLPREDNISOLONE ACETATE 80 MG/ML IJ SUSP
80.0000 mg | Freq: Once | INTRAMUSCULAR | Status: AC
  Administered 2010-10-18: 80 mg via INTRAMUSCULAR

## 2010-10-18 MED ORDER — HYDROCODONE-HOMATROPINE 5-1.5 MG/5ML PO SYRP: 5.0000 mL | ORAL_SOLUTION | Freq: Four times a day (QID) | ORAL | Status: AC | PRN

## 2010-10-18 MED ORDER — AZITHROMYCIN 250 MG PO TABS: ORAL_TABLET | ORAL | Status: AC

## 2010-10-18 NOTE — Progress Notes (Signed)
  Subjective:    Patient ID: Luis Guzman, male    DOB: 1946/03/10, 65 y.o.   MRN: [id number]  HPI  patient is an ex-smoker with onset last week of cough and nasal congestion. Cough occasionally productive of yellow sputum. Wife with similar symptoms. No fever or chills. NyQuil without much relief. Increased wheezing past few days. No dyspnea. Patient denies any nausea, vomiting, or diarrhea.   Review of Systems  Constitutional: Positive for fatigue. Negative for fever, chills, activity change, appetite change and unexpected weight change.  HENT: Negative for ear pain, congestion, sore throat and trouble swallowing.   Respiratory: Positive for cough and wheezing. Negative for shortness of breath and stridor.   Cardiovascular: Negative for chest pain and leg swelling.  Gastrointestinal: Negative for abdominal pain.  Musculoskeletal: Negative for arthralgias.  Skin: Negative for rash.  Neurological: Negative for syncope and headaches.  Hematological: Negative for adenopathy.       Objective:   Physical Exam  patient is alert nontoxic in appearance. Afebrile Oropharynx is moist and clear Eardrums no acute changes Neck supple no adenopathy Chest diffuse wheezes. No rales. No retractions. Heart regular rhythm and rate Extremities no edema       Assessment & Plan:   acute asthmatic bronchitis. Depo-Medrol 80 mg IM. Zithromax for 5 days. Hydromet cough syrup as needed for severe nighttime coughing. Follow up promptly for any fever worsening symptoms.

## 2010-11-12 ENCOUNTER — Other Ambulatory Visit: Payer: Self-pay | Admitting: Orthopaedic Surgery

## 2010-11-12 DIAGNOSIS — M542 Cervicalgia: Secondary | ICD-10-CM

## 2010-11-13 ENCOUNTER — Ambulatory Visit
Admission: RE | Admit: 2010-11-13 | Discharge: 2010-11-13 | Disposition: A | Discharge status: Home / Self Care | Payer: BC Managed Care – PPO | Source: Ambulatory Visit | Attending: Orthopaedic Surgery | Admitting: Orthopaedic Surgery

## 2010-11-13 DIAGNOSIS — M542 Cervicalgia: Secondary | ICD-10-CM

## 2010-11-13 MED ORDER — GADOBENATE DIMEGLUMINE 529 MG/ML IV SOLN
19.0000 mL | Freq: Once | INTRAVENOUS | Status: AC | PRN
  Administered 2010-11-13: 19 mL via INTRAVENOUS

## 2010-11-25 LAB — BASIC METABOLIC PANEL
BUN: 12 mg/dL (ref 6–23)
CO2: 26 mEq/L (ref 19–32)
Calcium: 9.5 mg/dL (ref 8.4–10.5)
Chloride: 106 mEq/L (ref 96–112)
Creatinine, Ser: 0.73 mg/dL (ref 0.4–1.5)
GFR calc Af Amer: >60 mL/min (ref >60)
GFR calc non Af Amer: >60 mL/min (ref >60)
Glucose, Bld: 106 mg/dL — ABNORMAL HIGH (ref 70–99)
Potassium: 4.5 mEq/L (ref 3.5–5.1)
Sodium: 140 mEq/L (ref 135–145)

## 2010-11-25 LAB — POCT HEMOGLOBIN-HEMACUE: Hemoglobin: 13.9 g/dL (ref 13.0–17.0)

## 2010-11-30 LAB — BASIC METABOLIC PANEL
BUN: 16 mg/dL (ref 6–23)
BUN: 16 mg/dL (ref 6–23)
BUN: 6 mg/dL (ref 6–23)
BUN: 7 mg/dL (ref 6–23)
BUN: 7 mg/dL (ref 6–23)
CO2: 26 mEq/L (ref 19–32)
CO2: 26 mEq/L (ref 19–32)
CO2: 27 mEq/L (ref 19–32)
CO2: 29 mEq/L (ref 19–32)
CO2: 30 mEq/L (ref 19–32)
Calcium: 8.1 mg/dL — ABNORMAL LOW (ref 8.4–10.5)
Calcium: 8.2 mg/dL — ABNORMAL LOW (ref 8.4–10.5)
Calcium: 8.3 mg/dL — ABNORMAL LOW (ref 8.4–10.5)
Calcium: 8.4 mg/dL (ref 8.4–10.5)
Calcium: 8.9 mg/dL (ref 8.4–10.5)
Chloride: 102 mEq/L (ref 96–112)
Chloride: 103 mEq/L (ref 96–112)
Chloride: 109 mEq/L (ref 96–112)
Chloride: 98 mEq/L (ref 96–112)
Chloride: 98 mEq/L (ref 96–112)
Creatinine, Ser: 0.71 mg/dL (ref 0.4–1.5)
Creatinine, Ser: 0.8 mg/dL (ref 0.4–1.5)
Creatinine, Ser: 0.89 mg/dL (ref 0.4–1.5)
Creatinine, Ser: 0.99 mg/dL (ref 0.4–1.5)
Creatinine, Ser: 1.1 mg/dL (ref 0.4–1.5)
GFR calc Af Amer: >60 mL/min (ref >60)
GFR calc Af Amer: >60 mL/min (ref >60)
GFR calc Af Amer: >60 mL/min (ref >60)
GFR calc Af Amer: >60 mL/min (ref >60)
GFR calc Af Amer: >60 mL/min (ref >60)
GFR calc non Af Amer: >60 mL/min (ref >60)
GFR calc non Af Amer: >60 mL/min (ref >60)
GFR calc non Af Amer: >60 mL/min (ref >60)
GFR calc non Af Amer: >60 mL/min (ref >60)
GFR calc non Af Amer: >60 mL/min (ref >60)
Glucose, Bld: 109 mg/dL — ABNORMAL HIGH (ref 70–99)
Glucose, Bld: 109 mg/dL — ABNORMAL HIGH (ref 70–99)
Glucose, Bld: 117 mg/dL — ABNORMAL HIGH (ref 70–99)
Glucose, Bld: 133 mg/dL — ABNORMAL HIGH (ref 70–99)
Glucose, Bld: 142 mg/dL — ABNORMAL HIGH (ref 70–99)
Potassium: 3.2 mEq/L — ABNORMAL LOW (ref 3.5–5.1)
Potassium: 3.2 mEq/L — ABNORMAL LOW (ref 3.5–5.1)
Potassium: 3.6 mEq/L (ref 3.5–5.1)
Potassium: 4.2 mEq/L (ref 3.5–5.1)
Potassium: 4.2 mEq/L (ref 3.5–5.1)
Sodium: 131 mEq/L — ABNORMAL LOW (ref 135–145)
Sodium: 132 mEq/L — ABNORMAL LOW (ref 135–145)
Sodium: 137 mEq/L (ref 135–145)
Sodium: 138 mEq/L (ref 135–145)
Sodium: 142 mEq/L (ref 135–145)

## 2010-11-30 LAB — CBC
HCT: 30.8 % — ABNORMAL LOW (ref 39.0–52.0)
HCT: 31 % — ABNORMAL LOW (ref 39.0–52.0)
HCT: 31.3 % — ABNORMAL LOW (ref 39.0–52.0)
HCT: 37.2 % — ABNORMAL LOW (ref 39.0–52.0)
HCT: 45.3 % (ref 39.0–52.0)
Hemoglobin: 10.6 g/dL — ABNORMAL LOW (ref 13.0–17.0)
Hemoglobin: 10.9 g/dL — ABNORMAL LOW (ref 13.0–17.0)
Hemoglobin: 11 g/dL — ABNORMAL LOW (ref 13.0–17.0)
Hemoglobin: 13 g/dL (ref 13.0–17.0)
Hemoglobin: 15.8 g/dL (ref 13.0–17.0)
MCHC: 34.2 g/dL (ref 30.0–36.0)
MCHC: 34.8 g/dL (ref 30.0–36.0)
MCHC: 35 g/dL (ref 30.0–36.0)
MCHC: 35.1 g/dL (ref 30.0–36.0)
MCHC: 35.2 g/dL (ref 30.0–36.0)
MCV: 91 fL (ref 78.0–100.0)
MCV: 91.4 fL (ref 78.0–100.0)
MCV: 91.6 fL (ref 78.0–100.0)
MCV: 91.7 fL (ref 78.0–100.0)
MCV: 92.9 fL (ref 78.0–100.0)
Platelets: 162 10*3/uL (ref 150–400)
Platelets: 175 10*3/uL (ref 150–400)
Platelets: 179 10*3/uL (ref 150–400)
Platelets: 199 10*3/uL (ref 150–400)
Platelets: 207 10*3/uL (ref 150–400)
RBC: 3.34 MIL/uL — ABNORMAL LOW (ref 4.22–5.81)
RBC: 3.39 MIL/uL — ABNORMAL LOW (ref 4.22–5.81)
RBC: 3.41 MIL/uL — ABNORMAL LOW (ref 4.22–5.81)
RBC: 4.05 MIL/uL — ABNORMAL LOW (ref 4.22–5.81)
RBC: 4.96 MIL/uL (ref 4.22–5.81)
RDW: 12.9 % (ref 11.5–15.5)
RDW: 13 % (ref 11.5–15.5)
RDW: 13.2 % (ref 11.5–15.5)
RDW: 13.4 % (ref 11.5–15.5)
RDW: 13.4 % (ref 11.5–15.5)
WBC: 10 10*3/uL (ref 4.0–10.5)
WBC: 10.3 10*3/uL (ref 4.0–10.5)
WBC: 10.6 10*3/uL — ABNORMAL HIGH (ref 4.0–10.5)
WBC: 11.5 10*3/uL — ABNORMAL HIGH (ref 4.0–10.5)
WBC: 16.1 10*3/uL — ABNORMAL HIGH (ref 4.0–10.5)

## 2010-11-30 LAB — COMPREHENSIVE METABOLIC PANEL
ALT: 28 U/L (ref 0–53)
AST: 24 U/L (ref 0–37)
Albumin: 3.9 g/dL (ref 3.5–5.2)
Alkaline Phosphatase: 80 U/L (ref 39–117)
BUN: 15 mg/dL (ref 6–23)
CO2: 27 mEq/L (ref 19–32)
Calcium: 8.8 mg/dL (ref 8.4–10.5)
Chloride: 110 mEq/L (ref 96–112)
Creatinine, Ser: 0.89 mg/dL (ref 0.4–1.5)
GFR calc Af Amer: >60 mL/min (ref >60)
GFR calc non Af Amer: >60 mL/min (ref >60)
Glucose, Bld: 119 mg/dL — ABNORMAL HIGH (ref 70–99)
Potassium: 4.5 mEq/L (ref 3.5–5.1)
Sodium: 142 mEq/L (ref 135–145)
Total Bilirubin: 0.6 mg/dL (ref 0.3–1.2)
Total Protein: 6.3 g/dL (ref 6.0–8.3)

## 2010-11-30 LAB — DIFFERENTIAL
Basophils Absolute: 0.1 10*3/uL (ref 0.0–0.1)
Basophils Relative: 1 % (ref 0–1)
Eosinophils Absolute: 0.4 10*3/uL (ref 0.0–0.7)
Eosinophils Relative: 3 % (ref 0–5)
Lymphocytes Relative: 28 % (ref 12–46)
Lymphs Abs: 3 10*3/uL (ref 0.7–4.0)
Monocytes Absolute: 1 10*3/uL (ref 0.1–1.0)
Monocytes Relative: 10 % (ref 3–12)
Neutro Abs: 6.2 10*3/uL (ref 1.7–7.7)
Neutrophils Relative %: 58 % (ref 43–77)

## 2010-11-30 LAB — PROTIME-INR
INR: 1 (ref 0.00–1.49)
Prothrombin Time: 13.5 seconds (ref 11.6–15.2)

## 2010-11-30 LAB — APTT: aPTT: 29 seconds (ref 24–37)

## 2010-12-20 ENCOUNTER — Encounter: Payer: Self-pay | Admitting: Family Medicine

## 2010-12-20 ENCOUNTER — Ambulatory Visit (INDEPENDENT_AMBULATORY_CARE_PROVIDER_SITE_OTHER): Payer: BC Managed Care – PPO | Admitting: Family Medicine

## 2010-12-20 DIAGNOSIS — R0989 Other specified symptoms and signs involving the circulatory and respiratory systems: Secondary | ICD-10-CM

## 2010-12-20 DIAGNOSIS — I10 Essential (primary) hypertension: Secondary | ICD-10-CM

## 2010-12-20 DIAGNOSIS — R0600 Dyspnea, unspecified: Secondary | ICD-10-CM

## 2010-12-20 DIAGNOSIS — R0609 Other forms of dyspnea: Secondary | ICD-10-CM

## 2010-12-20 NOTE — Progress Notes (Signed)
  Subjective:    Patient ID: Luis Guzman, male    DOB: 01-18-1946, 65 y.o.   MRN: [id number]  HPI Patient has history of hyperlipidemia and hypertension. He is seen today with approximately one-month history of exertional dyspnea. This occurs with activities such as lawn mowing and never at rest. Symptoms predictably start about 10 minutes after mowing and improved after rest. No chest pain. Increased fatigue above normal over the past month with activity. Sometimes feels lightheaded and occasionally some nausea without vomiting. He felt this was due to side effect from his blood pressure medication. No orthostatic symptoms.  Hypertension treated with lisinopril HCTZ. Also takes simvastatin 80 mg daily and aspirin 81 mg daily. Remote history of stress test but this was many years ago. Former smoker. Positive family history of coronary artery disease. Patient has no history of diabetes   Review of Systems  Constitutional: Positive for fatigue. Negative for fever, chills, activity change and unexpected weight change.  Respiratory: Negative for cough and shortness of breath.   Cardiovascular: Negative for chest pain, palpitations and leg swelling.  Gastrointestinal: Negative for abdominal pain.  Genitourinary: Negative for dysuria.  Skin: Negative for rash.  Neurological: Negative for syncope.       Objective:   Physical Exam  Constitutional: He is oriented to person, place, and time. He appears well-developed and well-nourished. No distress.  Neck: Neck supple.  Cardiovascular: Normal rate, regular rhythm and normal heart sounds.   No murmur heard. Pulmonary/Chest: Breath sounds normal. No respiratory distress. He has no wheezes. He has no rales.  Musculoskeletal: He exhibits no edema.  Lymphadenopathy:    He has no cervical adenopathy.  Neurological: He is alert and oriented to person, place, and time.          Assessment & Plan:  Exertional dyspnea worrisome for possible  exertional angina. He does not have any history of pulmonary problems. He does not exercise regularly an is likely deconditioned but fatigue and dyspnea are new symptoms.  Obtain EKG. Schedule cardiac evaluation. Continue aspirin.  Avoidance of exertional activity for now.

## 2010-12-20 NOTE — Patient Instructions (Signed)
No heavy exertional activity until further evaluated.

## 2010-12-28 NOTE — Op Note (Signed)
Luis Guzman, Luis Guzman                   ACCOUNT NO.:  [id number]   MEDICAL RECORD NO.:  [id number]          PATIENT TYPE:  INP   LOCATION:  5020                         FACILITY:  [facility]   PHYSICIAN:  Claude Manges. [name], M.D.DATE OF BIRTH:  05/24/1946   DATE OF PROCEDURE:  DATE OF DISCHARGE:                               OPERATIVE REPORT   PREOPERATIVE DIAGNOSES:  Status post intramedullary nailing, left tibia  fracture, with fasciotomy x3 days.   POSTOPERATIVE DIAGNOSES:  Status post intramedullary nailing, left tibia  fracture, with fasciotomy x3 days.   PROCEDURE:  Incision and drainage of fasciotomy wound, left leg and  reapplication of vacuum dressing.   SURGEON:  Claude Manges. Cleophas Dunker, MD   ASSISTANT:  Jacqualine Code, PA-C   ANESTHESIA:  General orotracheal.   COMPLICATIONS:  None.   PROCEDURE:  With the patient comfortable on the operating table, he was  placed under general anesthesia.  Nursing staff performed an in and out  catheterization with over 800 mL of clear urine.  The left lower  extremity dressing and splint were removed.  The fasciotomy wound was  approximately 4 inches in length along the anterolateral compartment.  The muscle was bulging.  It was nice and pink and viable, but it was  just too swollen to close.  The insertion incision was fine as well as  the small incision to insert to transverse screws.  There was an area of  blister formation that was noted preoperatively along the distal third  of the leg that was also stable.   The leg was prepped with Betadine solution and then I irrigated the  fasciotomy wound with saline solution.  New vacuum dressing was applied.  The posterior compartment appeared to be very supple as were the  anterior and lateral compartments.  The fracture was perfectly stable.  There was no motion on moving the leg.   A sterile bulky dressing was applied.  We will apply an equalizer boot  rather than the splint and discharge him  with the vacuum dressing for  consideration of secondary closure at some point in the future or a  split-thickness skin graft.      Claude Manges. Cleophas Dunker, M.D.  Electronically Signed     PWW/MEDQ  D:  09/21/2008  T:  09/21/2008  Job:  [id number]

## 2010-12-28 NOTE — Op Note (Signed)
Luis Guzman, Luis Guzman                   ACCOUNT NO.:  [id number]   MEDICAL RECORD NO.:  [id number]          PATIENT TYPE:  INP   LOCATION:  5020                         FACILITY:  [facility]   PHYSICIAN:  Rodney A. Mortenson, M.D.DATE OF BIRTH:  1946/02/15   DATE OF PROCEDURE:  DATE OF DISCHARGE:                               OPERATIVE REPORT   PREOPERATIVE DIAGNOSIS:  Fracture of left tibia and fracture of left  proximal fibula.   POSTOPERATIVE DIAGNOSIS:  Fracture of left tibia and fracture of left  proximal fibula.   OPERATIONS:  Open reduction and internal fixation using intramedullary  tibial nail, left tibia; fasciotomy anterior and lateral compartments,  left leg.   ANESTHESIA:  General.   SURGEON:  Rodney A. Chaney Malling, MD   ASSISTANT:  Oris Drone. Petrarca, PA-C   PROCEDURE:  The patient was placed on the operating table in a supine  position with a pneumatic tourniquet about the left upper thigh.  Leg  was prepped and compartment pressures were measured.  There was some  tightness of the anterior compartment at about 30 mm.  After this was  completed, left lower extremity was prepped with DuraPrep and draped out  in the usual manner.  Leg was wrapped out with an Esmarch and tourniquet  was elevated.  C-arm was used throughout.  An incision was made over the  anterior aspect of the patella down to the tibial tubercle in the  midline.  Skin edges were retracted.  An incision was made just medial  to the medial border of the tibial tendon and blunt finger dissection  brought just posterior to the fat pad anterior to the tibial spine.  Using the C-arm, a guide pin was passed down through the anterior tibia  into the metaphyseal area.  This was checked in both the AP and lateral  views and excellent positioning was achieved.  This was over reamed with  a large reamer.  This was slid down very nicely directly midline.  The  initial pin was removed and a long guide pin with a ball was  passed down  the hole and proximal tibia.  The fracture was reduced by hand and the  guide wire was placed down to just above the distal end of the tibia  about 1 cm away from the joint.  This measured 37.5 cm.  The fracture  was reduced.  A series of reamers were passed over the guide pin.  This  was reamed out to a 13-mm diameter.  A 12-mm nail was selected that was  37.5 cm in length.  The external guide was placed over the tibial nail  and this was driven down the tibia over the guide pin.  This was passed  by the fracture as the fracture was held in reduced position and almost  anatomic reduction was achieved.  The IM nail was placed down to just  above the joint itself about 1 cm to 1.5 cm from the ankle joint.  Excellent position was achieved.  In both the AP and lateral views,  there was comminuted  fracture, had an almost anatomic reduction and  could barely to be seen.  I was very pleased with the reduction.  Using  the external guide, drill holes were placed obliquely, both on the  medial and lateral side.  Drill holes were made, measured, and  appropriate length screws were passed through the proximal end of the  nail to lock the nail in position.  In a similar manner, attention was  turned to the distal end of the tibia.  Using freehand technique,  fixation screws were passed to 2 distal holes in the nail.  Excellent  fixation was achieved, both proximally and distally and excellent  reduction was achieved.  There was still some tightness in the anterior  compartment and an incision was made just lateral to the tibia.  An open  fasciotomy was then done from proximal to distal.  An incision was made  at the mid and distal third of the tibia to facilitate the fasciotomy.  The septum was also divided.  The defect compartment was also  decompressed.  Excellent fasciotomy was done.  The muscle was viable and  responded to irritation.  The wound was then irrigated with copious   amounts of saline solution.  Proximal wound was closed with Vicryl and  stainless steel staples.  Wound VAC was placed over the fasciotomy site  distally.  Sterile dressings were applied, and the patient returned to  recovery room in excellent condition.  Technically, this procedure went  extremely well.   DRAINS:  Wound VAC.   COMPLICATIONS:  None.      Rodney A. Chaney Malling, M.D.  Electronically Signed     RAM/MEDQ  D:  09/18/2008  T:  09/19/2008  Job:  [id number]

## 2010-12-28 NOTE — Op Note (Signed)
NAMELENIN, KUHNLE                   ACCOUNT NO.:  [id number]   MEDICAL RECORD NO.:  [id number]          PATIENT TYPE:  AMB   LOCATION:  DSC                          FACILITY:  [facility]   PHYSICIAN:  Loreta Ave, MD DATE OF BIRTH:  [birth year]   DATE OF PROCEDURE:  10/15/2008  DATE OF DISCHARGE:                               OPERATIVE REPORT   PREOPERATIVE DIAGNOSIS:  Left leg wound.   POSTOPERATIVE DIAGNOSIS:  Left leg wound.   PROCEDURE:  Complex closure of left leg wound.   SURGEON:  Loreta Ave, MD   ANESTHESIA:  General.   IV FLUIDS:  650 mL of crystalloid.   URINE OUTPUT:  Not recorded.   ESTIMATED BLOOD LOSS:  50 cc.   SURGICAL TIME:  41 minutes.   CLINICAL INDICATIONS:  Luis Guzman is a 65 year old male status post  complex fracture of left leg with postoperative compartment syndrome  involving the anterior compartment.  His anterior compartment was  released after his orthopedic surgery approximately 1 month ago which  left him with a longitudinal 10 x 3 cm, ellipsoid wound over the  anterior compartment of the left leg.  Since the hospital discharge, he  has been maintained with a VAC dressing to this wound.  It is clinically  free of infection, his swelling has subsided, and he presents now for  possible skin graft and possible closure of his wound.   After discussing the risks of surgery which include and not limited to  bleeding, infection, damage to the nearby structures, partial or total  skin graft loss, breakdown of his incision, wound healing problems, and  the need for future surgery, Sahas understands these risks, and desires  to proceed.   DESCRIPTION OF OPERATION:  The patient was brought to the operating room  and placed in supine position on the operating room table.  After smooth  and routine induction of general anesthesia, the patient's left lower  extremity and left hip were prepped with chlorhexidine and draped into a  sterile field.   A 25 mL of 0.5% Marcaine with epinephrine was  infiltrated subcutaneously around the left leg wound.  The wound edges  were elevated with skin hooks and blunt dissection proceeded  circumferentially for 10 cm in all directions.  Hemostasis was obtained  with electrocautery.  A 0-nylon retention stitch was placed in the  center of the wound which allowed approximation of the wound edges with  minimal amount of tension at the location of the stitch.  A 10-French  TLS drain was then placed via separate stab incision inferiorly.  This  was sutured to the skin with 2-0 silk.  Next, the skin was closed  with a combination of 2-0 vertical mattress nylon sutures and 3-0  interrupted, buried Monocryl sutures.  The retention suture was then  removed at the end of the operation to disperse tension throughout.  The  sponge and needle counts reported as correct x2.  The patient was  extubated and transported to the recovery room in stable condition.      Loreta Ave, MD  Electronically Signed     CF/MEDQ  D:  10/15/2008  T:  10/16/2008  Job:  [id number]

## 2010-12-31 NOTE — Discharge Summary (Signed)
Luis Guzman, Luis Guzman                   ACCOUNT NO.:  [id number]   MEDICAL RECORD NO.:  [id number]          PATIENT TYPE:  INP   LOCATION:  5020                         FACILITY:  [facility]   PHYSICIAN:  Claude Manges. [name], M.D.DATE OF BIRTH:  February 03, 1946   DATE OF ADMISSION:  09/18/2008  DATE OF DISCHARGE:  09/22/2008                               DISCHARGE SUMMARY   ADMISSION DIAGNOSIS:  Tibial shaft fracture and proximal fibular  fracture of the left lower leg.   DISCHARGE DIAGNOSES:  1. Tibial shaft fracture and proximal fibular fracture of the left      lower leg.  2. History of hypertension and hypokalemia.  3. Posthemorrhagic anemia.  4. Long-term use of aspirin.  5. Compartment syndrome.   PROCEDURES:  1. On September 18, 2008, intramedullary nailing of the left tibia.  2. On September 18, 2008, compartment measurement with anterior and      lateral compartment releases and application of wound VAC.  3. On September 21, 2008, wound irrigation and re-VAC.   HISTORY OF PRESENT ILLNESS:  Mr. Acy is a very pleasant 65 year old  white male who was on September 18, 2008, was taking out  the trash at  7:30 in the morning.  He twisted his left leg on the ice and fell  injuring his left lower leg.  He was brought to the emergency room and  was evaluated by Dr. Lorre Nick in the emergency room.  He was noted  to have a tibial shaft fracture at least 3 parts and a proximal fibular  fracture of the left lower leg.  He was admitted at that time for  evaluation and surgical intervention.   HOSPITAL COURSE:  A 65 year old male admitted on September 18, 2008, after  appropriate laboratory studies were obtained and 1 g of vancomycin IV on-  call to the operating room.  He was taken to the operating room later  that evening where he underwent an IM nail of his left tibia with  compartment treatments revealing increased pressures, and anterior and  lateral compartment fasciotomy was performed.  The  wound VAC was placed.  He tolerated the procedure well.  He was placed on Arixtra 2.5 mg subcu  q.8 a.m.  Consultations with PT and OT were made, nonweightbearing on  the left.  Continuous pulse oximetry to the second toe and followup sats  were less than 90.  He was allowed out of bed to a chair the following  day.  He was allowed to be touchdown weightbearing with his walker.  Vancomycin was continued postoperatively at 1 g IV q.12 h. and this was  continued for an additional 24 hours.  He was then scheduled to be taken  back to the operating room on September 21, 2008, and he did have an I&D  of the left calf and it was felt that his wound was not able to be  closed and another VAC was placed.  Again, he had postop PT, OT, and  care management.  He still remained touchdown weightbearing.  Dilaudid  PCA pumps were used  postoperatively.  On the September 22, 2008, he was  ready for hospital bed with overhead trapeze and a 3-in-1 tub bench for  home.  Home Health and [facility] for wound VAC changes.  If the [facility] was  available he would be discharged, and it was and he was discharged on  September 22, 2008.  He will follow back up in the office on Friday.  EKGs  revealed normal sinus rhythm.  ST inferior leads.  Sinus rhythm with  normal P axis and ventricular rate of 50-99.  Borderline T abnormalities  in inferior leads with T flattening and that are negative in II, III,  and aVF.   RADIOGRAPHIC STUDIES:  On September 18, 2008, reveals a tibial shaft  fracture with intramedullary rod postoperatively.   LABORATORY STUDIES:  Hemoglobin of 15.8, hematocrit 45.3%, white count  10,600, and platelets 199,000.  Discharge hemoglobin 10.6, hematocrit  31.0%, white count 10,000, and platelets 179,000.  Protime 13.5, INR  1.0, PTT 29.  Preop sodium 142, potassium 4.2, chloride 109, CO2 of 26,  glucose 133, BUN 16, and creatinine 1.10.  His sodium dropped to 131 on  September 20, 2008, with a potassium of 3.2.   This improved and at the  time of discharge his sodium was 138, potassium 3.6, chloride 106, CO2  of 30, glucose 109, BUN 6, and creatinine 0.71.  GFR remained greater  than 60 throughout this hospital course.  Total protein 6.3, albumin  3.9, AST 24, ALT 28, ALP 80, bilirubin 0.6 and this was preop.   DISCHARGE INSTRUCTIONS:  He has no restrictions in his diet.  He will  increase his activity slowly using his walker, touchdown weightbearing.  No lifting or driving for 6 weeks.  Keep the incision clean and dry  around the knee in the front of the leg and may change that daily.  He  will begin the wound VAC changes at home.   Prescriptions were written for:  1. Percocet 5/325 one to two tabs every four hours as needed for pain.  2. Robaxin 500 mg one tab every 6 hours as needed for spasm.  3. Zofran 4 mg ODT one to two tabs on the tongue every 8 hours as      needed for nausea.  4. Aspirin 325 mg one daily.   Gentiva for VAC changes.  He was discharged in good condition and will  follow up with Dr. Cleophas Dunker on Friday postoperatively.      Oris Drone Petrarca, P.A.-C.      Claude Manges. Cleophas Dunker, M.D.  Electronically Signed    BDP/MEDQ  D:  10/28/2008  T:  10/29/2008  Job:  [id number]

## 2010-12-31 NOTE — Op Note (Signed)
NAME:  Luis Guzman, Luis Guzman                               ACCOUNT NO.:  [id number]   MEDICAL RECORD NO.:  [id number]                    PATIENT TYPE:   LOCATION:                                       FACILITY:   PHYSICIAN:  Elana Alm. Thurston Hole, M.D.              DATE OF BIRTH:   DATE OF PROCEDURE:  03/25/2002  DATE OF DISCHARGE:                                 OPERATIVE REPORT   PREOPERATIVE DIAGNOSIS:  Right shoulder rotator cuff tendinitis with  impingement and acromioclavicular joint arthrosis.   POSTOPERATIVE DIAGNOSES:  1. Right shoulder partial rotate cuff tear.  2. Right shoulder impingement.  3. Right shoulder acromioclavicular joint arthrosis.   PROCEDURE:  1. Right shoulder examination under anesthesia followed by arthroscopic     partial rotator cuff debridement.  2. Right shoulder subacromial decompression.  3. Right shoulder distal clavicle excision.   SURGEON:  Dr. Salvatore Marvel.   ASSISTANT:  Zigmund Gottron, NPA.   ANESTHESIA:  General.   OPERATIVE TIME:  Forty-five minutes.   COMPLICATIONS:  None.   INDICATIONS FOR PROCEDURE:  The patient is a 65 year old gentleman who has  had over a year of right shoulder pain with signs and symptoms of MRI  documenting rotator cuff tendinitis with impingement and AC joint arthrosis  who has failed conservative care and is now to undergo arthroscopy.   DESCRIPTION OF PROCEDURE:  The patient was brought to the operating room on  03/25/02 after a block had been placed in the holding room.  He was placed  on the operative table in the supine position.  His right shoulder was  examined under anesthesia.  He had full range of motion and his shoulder was  stable to ligamentous exam.  He was then placed in a beach chair position  and his shoulder and arm were prepped and draped using sterile technique.  Originally the arthroscopy was performed through a posterior arthroscopic  portal.  The arthroscope with a pump attachment was placed  and through an  anterior portal an arthroscopic probe was placed.   On initial inspection the articular cartilage in the glenohumeral joint was  intact.  The anterior and posterior labrum were intact.  The inferior labrum  and anterior inferior glenohumeral ligament complex was intact.  Superior  labrum and biceps tendon anchor were intact.  Biceps tendon was intact.  Rotator cuff was thoroughly inspected.  He had a small partial tear, 20%, of  the undersurface of the supraspinatus which was debrided.  The rest of the  rotator cuff was found to be intact.  The inferior capsular recess was free  of pathology.   The subacromial space was entered.  A large amount of bursitis was resected.  Underneath this the rotator cuff was frayed and inflamed but no evidence of  a tear.  Subacromial decompression was carried out removing 6 mm of the  undersurface  of the anterior, anterolateral and anteromedial acromion and CA  ligament release carried out as well.  The [facility] joint was then exposed.  Significant spurring and arthrosis was noted in this joint and thus the  distal clavicle was excised with a 6 mm burr.   After that was done then the shoulder could be brought through a full range  of motion with no impingement on the rotator cuff.  At this point it was  felt that all pathology had been satisfactorily addressed.  The instruments  were removed.  The portal was closed with 3-0 nylon suture and injected with  0.25% Marcaine with epinephrine, sterile dressings applied and a sling, and  the patient awakened and taken to the recovery room in stable condition.   FOLLOWUP CARE:  The patient will be followed as an outpatient on Vicodin and  Naprosyn with early aggressive physical therapy.  I will see him back in the  office in a week for sutures out and followup.                                                Robert A. Thurston Hole, M.D.    RAW/MEDQ  D:  03/25/2002  T:  03/28/2002  Job:  [id number]

## 2011-01-11 ENCOUNTER — Other Ambulatory Visit: Payer: Self-pay | Admitting: *Deleted

## 2011-01-11 MED ORDER — HYDROCODONE-HOMATROPINE 5-1.5 MG/5ML PO SYRP: 5.0000 mL | ORAL_SOLUTION | Freq: Four times a day (QID) | ORAL | Status: AC | PRN

## 2011-01-11 NOTE — Telephone Encounter (Signed)
Ok to refill once

## 2011-01-11 NOTE — Telephone Encounter (Signed)
Faxed refill request received for Hydrocodone homatrophine syrup, last filled at [facility] 10-18-10

## 2011-01-11 NOTE — Telephone Encounter (Signed)
Rx called in 

## 2011-03-31 ENCOUNTER — Other Ambulatory Visit (INDEPENDENT_AMBULATORY_CARE_PROVIDER_SITE_OTHER): Payer: BC Managed Care – PPO

## 2011-03-31 DIAGNOSIS — Z0000 Encounter for general adult medical examination without abnormal findings: Secondary | ICD-10-CM

## 2011-03-31 LAB — POCT URINALYSIS DIPSTICK
Bilirubin, UA: NEGATIVE
Blood, UA: NEGATIVE
Glucose, UA: NEGATIVE
Ketones, UA: NEGATIVE
Leukocytes, UA: NEGATIVE
Nitrite, UA: NEGATIVE
Protein, UA: NEGATIVE
Spec Grav, UA: 1.03
Urobilinogen, UA: 0.2
pH, UA: 5

## 2011-03-31 LAB — BASIC METABOLIC PANEL
BUN: 16 mg/dL (ref 6–23)
CO2: 27 mEq/L (ref 19–32)
Calcium: 9.1 mg/dL (ref 8.4–10.5)
Chloride: 107 mEq/L (ref 96–112)
Creatinine, Ser: 0.9 mg/dL (ref 0.4–1.5)
GFR: 93.6 mL/min (ref >60.00)
Glucose, Bld: 111 mg/dL — ABNORMAL HIGH (ref 70–99)
Potassium: 4.2 mEq/L (ref 3.5–5.1)
Sodium: 142 mEq/L (ref 135–145)

## 2011-03-31 LAB — CBC WITH DIFFERENTIAL/PLATELET
Basophils Absolute: 0.1 10*3/uL (ref 0.0–0.1)
Basophils Relative: 0.8 % (ref 0.0–3.0)
Eosinophils Absolute: 0.2 10*3/uL (ref 0.0–0.7)
Eosinophils Relative: 2.3 % (ref 0.0–5.0)
HCT: 45.6 % (ref 39.0–52.0)
Hemoglobin: 15.5 g/dL (ref 13.0–17.0)
Lymphocytes Relative: 29.6 % (ref 12.0–46.0)
Lymphs Abs: 2.5 10*3/uL (ref 0.7–4.0)
MCHC: 34 g/dL (ref 30.0–36.0)
MCV: 92.9 fl (ref 78.0–100.0)
Monocytes Absolute: 0.9 10*3/uL (ref 0.1–1.0)
Monocytes Relative: 10.2 % (ref 3.0–12.0)
Neutro Abs: 4.8 10*3/uL (ref 1.4–7.7)
Neutrophils Relative %: 57.1 % (ref 43.0–77.0)
Platelets: 202 10*3/uL (ref 150.0–400.0)
RBC: 4.91 Mil/uL (ref 4.22–5.81)
RDW: 13.4 % (ref 11.5–14.6)
WBC: 8.4 10*3/uL (ref 4.5–10.5)

## 2011-03-31 LAB — LIPID PANEL
Cholesterol: 153 mg/dL (ref 0–200)
HDL: 41.5 mg/dL (ref >39.00)
LDL Cholesterol: 90 mg/dL (ref 0–99)
Total CHOL/HDL Ratio: 4
Triglycerides: 110 mg/dL (ref 0.0–149.0)
VLDL: 22 mg/dL (ref 0.0–40.0)

## 2011-03-31 LAB — HEPATIC FUNCTION PANEL
ALT: 24 U/L (ref 0–53)
AST: 24 U/L (ref 0–37)
Albumin: 4.3 g/dL (ref 3.5–5.2)
Alkaline Phosphatase: 82 U/L (ref 39–117)
Bilirubin, Direct: 0.1 mg/dL (ref 0.0–0.3)
Total Bilirubin: 0.4 mg/dL (ref 0.3–1.2)
Total Protein: 6.7 g/dL (ref 6.0–8.3)

## 2011-03-31 LAB — TSH: TSH: 1.1 u[IU]/mL (ref 0.35–5.50)

## 2011-03-31 LAB — PSA: PSA: 2.27 ng/mL (ref 0.10–4.00)

## 2011-04-08 ENCOUNTER — Encounter: Payer: Self-pay | Admitting: Family Medicine

## 2011-04-08 ENCOUNTER — Ambulatory Visit (INDEPENDENT_AMBULATORY_CARE_PROVIDER_SITE_OTHER): Payer: BC Managed Care – PPO | Admitting: Family Medicine

## 2011-04-08 DIAGNOSIS — M159 Polyosteoarthritis, unspecified: Secondary | ICD-10-CM

## 2011-04-08 DIAGNOSIS — I10 Essential (primary) hypertension: Secondary | ICD-10-CM

## 2011-04-08 MED ORDER — OXYCODONE HCL 5 MG PO CAPS: 5.0000 mg | ORAL_CAPSULE | ORAL | Status: DC | PRN

## 2011-04-08 MED ORDER — TRAMADOL HCL 50 MG PO TABS: 50.0000 mg | ORAL_TABLET | Freq: Four times a day (QID) | ORAL | Status: DC | PRN

## 2011-04-08 MED ORDER — CARISOPRODOL 350 MG PO TABS: 350.0000 mg | ORAL_TABLET | Freq: Four times a day (QID) | ORAL | Status: DC | PRN

## 2011-04-08 NOTE — Patient Instructions (Signed)
Check on coverage for Shingles vaccine. Remember yearly flu vaccine

## 2011-04-08 NOTE — Progress Notes (Signed)
  Subjective:    Patient ID: Luis Guzman, male    DOB: [birth year], 65 y.o.   MRN: [id number]  HPI Patient here for complete physical exam. Recently had some exertional dyspnea. Saw cardiologist. Stress test unremarkable. Discontinued lisinopril and switched to losartan and dyspnea has improved. He has recently joined the [facility] and is exercising more. Trying to make some dietary changes. Past medical history reviewed. He has hyperlipidemia, hypertension, and osteoarthritis involving multiple joints.  Colonoscopy up to date. Patient had Pneumovax 2010. following leg surgery. No history of shingles vaccine and is uncertain of coverage. Tetanus up-to-date.  Recent change of blood pressure medication from lisinopril HCTZ to losartan 100 mg daily. Blood pressures ranging around 140/80 at home.  Osteoarthritis knees.  Takes prescriptions meds intermittently prn and needs refills.   Review of Systems  Constitutional: Negative for fever, activity change, appetite change, fatigue and unexpected weight change.  HENT: Negative for ear pain, congestion and trouble swallowing.   Eyes: Negative for pain and visual disturbance.  Respiratory: Negative for cough, shortness of breath and wheezing.   Cardiovascular: Negative for chest pain and palpitations.  Gastrointestinal: Negative for nausea, vomiting, abdominal pain, diarrhea, constipation, blood in stool, abdominal distention and rectal pain.  Genitourinary: Negative for dysuria, hematuria and testicular pain.  Musculoskeletal: Positive for arthralgias. Negative for joint swelling.  Skin: Negative for rash.  Neurological: Negative for dizziness, syncope and headaches.  Hematological: Negative for adenopathy.  Psychiatric/Behavioral: Negative for confusion and dysphoric mood.       Objective:   Physical Exam  Constitutional: He is oriented to person, place, and time. He appears well-developed and well-nourished. No distress.  HENT:  Head: Normocephalic  and atraumatic.  Right Ear: External ear normal.  Left Ear: External ear normal.  Mouth/Throat: Oropharynx is clear and moist.  Eyes: Conjunctivae and EOM are normal. Pupils are equal, round, and reactive to light.  Neck: Normal range of motion. Neck supple. No thyromegaly present.  Cardiovascular: Normal rate, regular rhythm and normal heart sounds.   No murmur heard. Pulmonary/Chest: No respiratory distress. He has no wheezes. He has no rales.  Abdominal: Soft. Bowel sounds are normal. He exhibits no distension and no mass. There is no tenderness. There is no rebound and no guarding.  Musculoskeletal: He exhibits no edema.  Lymphadenopathy:    He has no cervical adenopathy.  Neurological: He is alert and oriented to person, place, and time. He displays normal reflexes. No cranial nerve deficit.  Skin: No rash noted.  Psychiatric: He has a normal mood and affect.          Assessment & Plan:  #1 health maintenance. Colonoscopy up to date. Tetanus up to date. Pneumovax 2 years ago and consider booster in 3 years. Check on coverage for shingles vaccine. Continue weight loss efforts. #2 hypertension minimally elevated. Followup with cardiologist.  Continue weight loss efforts. #3 Osteoarthritis involving multiple joints. Refilled tramadol, oxycodone, and soma. He is not taking these regularly and no history of abuse. He has osteoarthritis which is not fully controlled with nonsteroidal or Tylenol

## 2011-04-20 ENCOUNTER — Ambulatory Visit
Admission: RE | Admit: 2011-04-20 | Discharge: 2011-04-20 | Disposition: A | Discharge status: Home / Self Care | Payer: BC Managed Care – PPO | Source: Ambulatory Visit | Attending: Orthopaedic Surgery | Admitting: Orthopaedic Surgery

## 2011-04-20 ENCOUNTER — Other Ambulatory Visit: Payer: Self-pay | Admitting: Orthopaedic Surgery

## 2011-04-20 DIAGNOSIS — M25511 Pain in right shoulder: Secondary | ICD-10-CM

## 2011-05-10 ENCOUNTER — Other Ambulatory Visit: Payer: Self-pay | Admitting: *Deleted

## 2011-05-10 MED ORDER — SIMVASTATIN 80 MG PO TABS: ORAL_TABLET | ORAL | Status: DC

## 2011-08-19 ENCOUNTER — Ambulatory Visit (INDEPENDENT_AMBULATORY_CARE_PROVIDER_SITE_OTHER): Payer: BC Managed Care – PPO | Admitting: Family Medicine

## 2011-08-19 ENCOUNTER — Encounter: Payer: Self-pay | Admitting: Family Medicine

## 2011-08-19 VITALS — BP 140/80 | Temp 98.3°F | Wt 213.0 lb

## 2011-08-19 DIAGNOSIS — M7710 Lateral epicondylitis, unspecified elbow: Secondary | ICD-10-CM

## 2011-08-19 DIAGNOSIS — M2550 Pain in unspecified joint: Secondary | ICD-10-CM

## 2011-08-19 LAB — SEDIMENTATION RATE: Sed Rate: 15 mm/hr (ref 0–22)

## 2011-08-19 NOTE — Progress Notes (Signed)
  Subjective:    Patient ID: Luis Guzman, male    DOB: 06/16/1946, 66 y.o.   MRN: [id number]  HPI  Has 2 separate areas of pain. He has left elbow pain for the past couple weeks. Right-hand dominant. No injury. Location is lateral elbow. Achy quality. Mild to moderate severity. Worse with gripping. Has not tried any ice. No recent nonsteroidals.  Bilateral knee pain. Worse over the past couple months. No signs of inflammation such as warmth erythema. Concerned because sister has history of rheumatoid arthritis. He denies any other arthralgias. His cardiologist did recently increase his amlodipine to 10 mg he also takes simvastatin 40 mg. Denies any generalized myalgias.  Has known history of osteoarthritis but this pain has been more acute with no clear provoking factors.   Review of Systems  Constitutional: Negative for fever and chills.  Respiratory: Negative for cough and shortness of breath.   Cardiovascular: Negative for chest pain.  Musculoskeletal: Positive for arthralgias. Negative for back pain, joint swelling and gait problem.  Skin: Negative for rash.  Neurological: Negative for weakness.  Hematological: Negative for adenopathy.       Objective:   Physical Exam  Constitutional: He appears well-developed and well-nourished. No distress.  Cardiovascular: Normal rate and regular rhythm.   Pulmonary/Chest: Effort normal and breath sounds normal. No respiratory distress. He has no wheezes. He has no rales.  Musculoskeletal: He exhibits no edema.       Minimal crepitus with flexion-extension both knees. No effusion. No warmth or erythema.  No tenderness.  Left elbow tender lateral epicondyle region. Range of motion full.  No olecranon edema.  No warmth or erythema.          Assessment & Plan:  #1 left tennis elbow. Try regular icing. Consider tennis elbow strap. Consider corticosteroid injection if not improving soon #2 bilateral knee pain. Doubt inflammatory arthritis such  as rheumatoid but he does have family history. Check sedimentation rate and rheumatoid factor. Question related to recent increase of amlodipine with simvastatin. If labs normal consider change to a different statin medication

## 2011-08-19 NOTE — Patient Instructions (Signed)
Tennis Elbow Your caregiver has diagnosed you with a condition often referred to as "tennis elbow." This results from small tears or soreness (inflammation) at the start (origin) of the extensor muscles of the forearm. Although the condition is often called tennis or golfer's elbow, it is caused by any repetitive action performed by your elbow. HOME CARE INSTRUCTIONS  If the condition has been short lived, rest may be the only treatment required. Using your opposite hand or arm to perform the task may help. Even changing your grip may help rest the extremity. These may even prevent the condition from recurring.   Longer standing problems, however, will often be relieved faster by:   Using anti-inflammatory agents.   Applying ice packs for 30 minutes at the end of the working day, at bed time, or when activities are finished.   Your caregiver may also have you wear a splint or sling. This will allow the inflamed tendon to heal.  At times, steroid injections aided with a local anesthetic will be required along with splinting for 1 to 2 weeks. Two to three steroid injections will often solve the problem. In some long standing cases, the inflamed tendon does not respond to conservative (non-surgical) therapy. Then surgery may be required to repair it. MAKE SURE YOU:   Understand these instructions.   Will watch your condition.   Will get help right away if you are not doing well or get worse.  Document Released: 08/01/2005 Document Revised: 04/13/2011 Document Reviewed: 03/19/2008 [facility] Patient Information 2012 [place], [place].

## 2011-08-20 LAB — RHEUMATOID FACTOR: Rhuematoid fact SerPl-aCnc: <10 IU/mL (ref <=14)

## 2011-08-23 NOTE — Progress Notes (Signed)
Quick Note:  Pt informed on home VM ______ 

## 2012-02-13 DIAGNOSIS — I10 Essential (primary) hypertension: Secondary | ICD-10-CM | POA: Diagnosis not present

## 2012-02-13 DIAGNOSIS — Z79899 Other long term (current) drug therapy: Secondary | ICD-10-CM | POA: Diagnosis not present

## 2012-02-13 DIAGNOSIS — R7309 Other abnormal glucose: Secondary | ICD-10-CM | POA: Diagnosis not present

## 2012-02-13 DIAGNOSIS — E782 Mixed hyperlipidemia: Secondary | ICD-10-CM | POA: Diagnosis not present

## 2012-02-14 ENCOUNTER — Encounter (HOSPITAL_COMMUNITY): Payer: Self-pay | Admitting: Emergency Medicine

## 2012-02-14 ENCOUNTER — Emergency Department (HOSPITAL_COMMUNITY)
Admission: EM | Admit: 2012-02-14 | Discharge: 2012-02-14 | Disposition: A | Discharge status: Home / Self Care | Payer: BC Managed Care – PPO | Attending: Emergency Medicine | Admitting: Emergency Medicine

## 2012-02-14 ENCOUNTER — Emergency Department (HOSPITAL_COMMUNITY): Payer: BC Managed Care – PPO

## 2012-02-14 DIAGNOSIS — R109 Unspecified abdominal pain: Secondary | ICD-10-CM | POA: Diagnosis not present

## 2012-02-14 DIAGNOSIS — I10 Essential (primary) hypertension: Secondary | ICD-10-CM | POA: Diagnosis not present

## 2012-02-14 DIAGNOSIS — R1013 Epigastric pain: Secondary | ICD-10-CM | POA: Insufficient documentation

## 2012-02-14 DIAGNOSIS — Z79899 Other long term (current) drug therapy: Secondary | ICD-10-CM | POA: Insufficient documentation

## 2012-02-14 DIAGNOSIS — R110 Nausea: Secondary | ICD-10-CM | POA: Insufficient documentation

## 2012-02-14 DIAGNOSIS — R079 Chest pain, unspecified: Secondary | ICD-10-CM | POA: Insufficient documentation

## 2012-02-14 DIAGNOSIS — E785 Hyperlipidemia, unspecified: Secondary | ICD-10-CM | POA: Insufficient documentation

## 2012-02-14 DIAGNOSIS — N281 Cyst of kidney, acquired: Secondary | ICD-10-CM | POA: Diagnosis not present

## 2012-02-14 DIAGNOSIS — Z7982 Long term (current) use of aspirin: Secondary | ICD-10-CM | POA: Insufficient documentation

## 2012-02-14 DIAGNOSIS — M1990 Unspecified osteoarthritis, unspecified site: Secondary | ICD-10-CM | POA: Insufficient documentation

## 2012-02-14 LAB — CBC WITH DIFFERENTIAL/PLATELET
Basophils Absolute: 0.1 10*3/uL (ref 0.0–0.1)
Basophils Relative: 1 % (ref 0–1)
Eosinophils Absolute: 0.2 10*3/uL (ref 0.0–0.7)
Eosinophils Relative: 2 % (ref 0–5)
HCT: 42.5 % (ref 39.0–52.0)
Hemoglobin: 14.6 g/dL (ref 13.0–17.0)
Lymphocytes Relative: 30 % (ref 12–46)
Lymphs Abs: 2.9 10*3/uL (ref 0.7–4.0)
MCH: 31.2 pg (ref 26.0–34.0)
MCHC: 34.4 g/dL (ref 30.0–36.0)
MCV: 90.8 fL (ref 78.0–100.0)
Monocytes Absolute: 1.1 10*3/uL — ABNORMAL HIGH (ref 0.1–1.0)
Monocytes Relative: 12 % (ref 3–12)
Neutro Abs: 5.1 10*3/uL (ref 1.7–7.7)
Neutrophils Relative %: 54 % (ref 43–77)
Platelets: 211 10*3/uL (ref 150–400)
RBC: 4.68 MIL/uL (ref 4.22–5.81)
RDW: 13 % (ref 11.5–15.5)
WBC: 9.5 10*3/uL (ref 4.0–10.5)

## 2012-02-14 LAB — COMPREHENSIVE METABOLIC PANEL
ALT: 17 U/L (ref 0–53)
AST: 21 U/L (ref 0–37)
Albumin: 3.9 g/dL (ref 3.5–5.2)
Alkaline Phosphatase: 99 U/L (ref 39–117)
BUN: 15 mg/dL (ref 6–23)
CO2: 26 mEq/L (ref 19–32)
Calcium: 9.2 mg/dL (ref 8.4–10.5)
Chloride: 100 mEq/L (ref 96–112)
Creatinine, Ser: 1.08 mg/dL (ref 0.50–1.35)
GFR calc Af Amer: 81 mL/min — ABNORMAL LOW (ref >90)
GFR calc non Af Amer: 70 mL/min — ABNORMAL LOW (ref >90)
Glucose, Bld: 145 mg/dL — ABNORMAL HIGH (ref 70–99)
Potassium: 3.2 mEq/L — ABNORMAL LOW (ref 3.5–5.1)
Sodium: 137 mEq/L (ref 135–145)
Total Bilirubin: 0.3 mg/dL (ref 0.3–1.2)
Total Protein: 6.9 g/dL (ref 6.0–8.3)

## 2012-02-14 LAB — PROTIME-INR
INR: 0.99 (ref 0.00–1.49)
Prothrombin Time: 13.3 seconds (ref 11.6–15.2)

## 2012-02-14 LAB — APTT: aPTT: 33 seconds (ref 24–37)

## 2012-02-14 LAB — LIPASE, BLOOD: Lipase: 34 U/L (ref 11–59)

## 2012-02-14 LAB — TROPONIN I: Troponin I: <0.3 ng/mL (ref <0.30)

## 2012-02-14 MED ORDER — ONDANSETRON HCL 4 MG/2ML IJ SOLN
4.0000 mg | Freq: Once | INTRAMUSCULAR | Status: AC
  Administered 2012-02-14: 4 mg via INTRAVENOUS
  Filled 2012-02-14: qty 2

## 2012-02-14 MED ORDER — HYDROMORPHONE HCL PF 1 MG/ML IJ SOLN
1.0000 mg | Freq: Once | INTRAMUSCULAR | Status: AC
  Administered 2012-02-14: 1 mg via INTRAVENOUS
  Filled 2012-02-14: qty 1

## 2012-02-14 MED ORDER — PANTOPRAZOLE SODIUM 40 MG PO TBEC: 40.0000 mg | DELAYED_RELEASE_TABLET | Freq: Every day | ORAL | Status: DC

## 2012-02-14 MED ORDER — KETOROLAC TROMETHAMINE 30 MG/ML IJ SOLN
15.0000 mg | Freq: Once | INTRAMUSCULAR | Status: AC
  Administered 2012-02-14: 15 mg via INTRAVENOUS

## 2012-02-14 MED ORDER — KETOROLAC TROMETHAMINE 30 MG/ML IJ SOLN
INTRAMUSCULAR | Status: AC
  Filled 2012-02-14: qty 1

## 2012-02-14 MED ORDER — SODIUM CHLORIDE 0.9 % IV SOLN
Freq: Once | INTRAVENOUS | Status: AC
  Administered 2012-02-14: 06:00:00 via INTRAVENOUS

## 2012-02-14 NOTE — Discharge Instructions (Signed)

## 2012-02-14 NOTE — ED Notes (Signed)
Patient transported to Ultrasound 

## 2012-02-14 NOTE — ED Provider Notes (Signed)
Pain resolved. Pt feeling better. Labs and [place] are unremarkable. Doubt cardiac etiology given nature, location and duration of pain. Cardiac marker neg. Advised patient that he may need outpatient endoscopy vs HIDA to further evaluate his symptoms. Advised followup with his GI, Dr. Matthias Hughs and return to the ED for any worsening pain, vomiting, fever or for any other concerns.   Luis Adebayo B. Bernette Mayers, MD 02/14/12 [phone]

## 2012-02-14 NOTE — ED Provider Notes (Signed)
History     CSN: [id number]  Arrival date & time 02/14/12  0520   First MD Initiated Contact with Patient 02/14/12 0530      Chief Complaint  Patient presents with  . Chest Pain    (Consider location/radiation/quality/duration/timing/severity/associated sxs/prior treatment) HPI Comments: Pt ate dinner at [facility] last night, had steak, potatoes and Caesar salad with Caesar dressing, about 1 hour afterwards began having pain in upper epigastrium and lower chest region.  Sharp mostly in nature.  No sweats, SOB.  Now feeling nauseated, pain getting worse.  He did see cardiologist Dr. Clarene Duke yesterday and was given 2 new HTN meds.  Pt has previously had appendectomy at age 76 and some orthopedic surgeries.  Pt has sig h/o HTN, hyperlipidemia.   PCP is Dr. Caryl Never based on old records.    Patient is a 66 y.o. male presenting with chest pain. The history is provided by the patient and the spouse.  Chest Pain Primary symptoms include abdominal pain and nausea. Pertinent negatives for primary symptoms include no fever, no shortness of breath, no palpitations and no vomiting.     Past Medical History  Diagnosis Date  . HYPERLIPIDEMIA 01/26/2009  . HYPERTENSION 01/26/2009  . OSTEOARTHRITIS, GENERALIZED, MULTIPLE JOINTS 01/06/2010    Past Surgical History  Procedure Date  . Appendectomy     age 36  . Spine surgery 1997    cervical laminectomy  . Rotator cuff repair     left 2004, right 2009  . Orif fibula fracture     compartment syndrome    Family History  Problem Relation Age of Onset  . Hypertension Mother   . Hypertension Father   . Arthritis Sister     rheumatoid  . Stroke Sister 32    History  Substance Use Topics  . Smoking status: Former Smoker -- 1.0 packs/day for 5 years    Types: Cigarettes    Quit date: 10/17/1968  . Smokeless tobacco: Not on file  . Alcohol Use: No      Review of Systems  Constitutional: Negative for fever and chills.  Respiratory:  Negative for shortness of breath.   Cardiovascular: Positive for chest pain. Negative for palpitations.  Gastrointestinal: Positive for nausea and abdominal pain. Negative for vomiting, diarrhea, blood in stool and abdominal distention.  Musculoskeletal: Negative for back pain.  Skin: Negative for rash.  All other systems reviewed and are negative.    Allergies  Penicillins  Home Medications   Current Outpatient Rx  Name Route Sig Dispense Refill  . AMLODIPINE BESYLATE 10 MG PO TABS Oral Take 10 mg by mouth daily.      . ASPIRIN 81 MG PO TABS Oral Take 81 mg by mouth daily.      [name] CARISOPRODOL 350 MG PO TABS Oral Take 1 tablet (350 mg total) by mouth 4 (four) times daily as needed. 60 tablet 1  . LOSARTAN POTASSIUM 100 MG PO TABS Oral Take 100 mg by mouth daily.      . MELOXICAM 7.5 MG PO TABS Oral Take 7.5 mg by mouth daily.      . OXYCODONE HCL 5 MG PO CAPS Oral Take 1 capsule (5 mg total) by mouth every 4 (four) hours as needed. 60 capsule 0  . SIMVASTATIN 80 MG PO TABS  Take one half daily 90 tablet 1  . TRAMADOL HCL 50 MG PO TABS Oral Take 1 tablet (50 mg total) by mouth every 6 (six) hours as needed. 90 tablet  3    BP 150/75  Pulse 73  Temp 98.4 F (36.9 C) (Oral)  Resp 19  SpO2 99%  Physical Exam  Constitutional: He is oriented to person, place, and time. He appears well-developed and well-nourished.  Non-toxic appearance. He does not have a sickly appearance. He appears distressed.  HENT:  Head: Normocephalic.  Mouth/Throat: Oropharynx is clear and moist.  Eyes: Pupils are equal, round, and reactive to light. No scleral icterus.  Cardiovascular: Normal rate.   No murmur heard. Pulmonary/Chest: Effort normal. No respiratory distress. He has no wheezes.  Abdominal: Soft. There is tenderness in the epigastric area. There is guarding. There is no rebound, no tenderness at McBurney's point and negative Murphy's sign.    Neurological: He is alert and oriented to person,  place, and time.  Skin: Skin is warm and dry. No rash noted. He is not diaphoretic.    ED Course  Procedures (including critical care time)   Labs Reviewed  CBC WITH DIFFERENTIAL  COMPREHENSIVE METABOLIC PANEL  LIPASE, BLOOD  URINALYSIS, ROUTINE W REFLEX MICROSCOPIC  APTT  PROTIME-INR  TROPONIN I   No results found.   No diagnosis found.  RA sat is 99% and I interpret to be normal.  ECG at time 05:24 shows NSR at rate 73, normal axis, intervals. No ST or T wave abns, normal ECG.   6:16 AM Pt received no sig relief from first dose of IV dilaudid.  Will give a 2nd dose.  Some toradol as well    Pt has no sig elevation of LFT, normal WBC.  Pt is signed out to Dr. Bernette Mayers for follow up of other labs, U/S and to reassess pt for clinical improvement.    MDM  Neg Murphy's on exam, however clearly tender in upper epigastrium on exam, symptoms began after eating dinner, seems to be colicky pain both by history and exam currently.  Pt cannot get comfortable.  Normal ECG.  Will get labs, analgesics, U/S.            Gavin Pound. Oletta Lamas, MD 02/15/12 [phone]

## 2012-02-14 NOTE — ED Notes (Signed)
PT. REPORTS MID CHEST PAIN WITH NAUSEA ONSET LAST NIGHT , DENIES SOB /COUGH OR DIAPHORESIS, STATES RECENT CHANGED II HIS ANTIHYPERTENSIVE MEDICATIONS .

## 2012-02-15 ENCOUNTER — Other Ambulatory Visit (HOSPITAL_COMMUNITY): Payer: Self-pay | Admitting: Gastroenterology

## 2012-02-15 DIAGNOSIS — R072 Precordial pain: Secondary | ICD-10-CM | POA: Diagnosis not present

## 2012-02-21 ENCOUNTER — Encounter (HOSPITAL_COMMUNITY)
Admission: RE | Admit: 2012-02-21 | Discharge: 2012-02-21 | Disposition: A | Discharge status: Home / Self Care | Payer: Medicare Other | Source: Ambulatory Visit | Attending: Gastroenterology | Admitting: Gastroenterology

## 2012-02-21 DIAGNOSIS — R072 Precordial pain: Secondary | ICD-10-CM

## 2012-02-21 MED ORDER — SINCALIDE 5 MCG IJ SOLR
INTRAMUSCULAR | Status: AC
  Filled 2012-02-21: qty 5

## 2012-02-21 MED ORDER — TECHNETIUM TC 99M MEBROFENIN IV KIT
5.0000 | PACK | Freq: Once | INTRAVENOUS | Status: AC | PRN
  Administered 2012-02-21: 5 via INTRAVENOUS

## 2012-02-21 MED ORDER — SINCALIDE 5 MCG IJ SOLR
0.0200 ug/kg | Freq: Once | INTRAMUSCULAR | Status: AC
  Administered 2012-02-21: 5 ug via INTRAVENOUS

## 2012-02-23 DIAGNOSIS — R0789 Other chest pain: Secondary | ICD-10-CM | POA: Diagnosis not present

## 2012-03-05 ENCOUNTER — Encounter: Payer: Self-pay | Admitting: Family Medicine

## 2012-04-09 ENCOUNTER — Ambulatory Visit (INDEPENDENT_AMBULATORY_CARE_PROVIDER_SITE_OTHER): Payer: Medicare Other | Admitting: Family Medicine

## 2012-04-09 ENCOUNTER — Encounter: Payer: Self-pay | Admitting: Family Medicine

## 2012-04-09 VITALS — BP 120/68 | HR 60 | Temp 98.5°F | Resp 12 | Ht 73.0 in | Wt 208.0 lb

## 2012-04-09 DIAGNOSIS — M159 Polyosteoarthritis, unspecified: Secondary | ICD-10-CM | POA: Diagnosis not present

## 2012-04-09 DIAGNOSIS — Z0000 Encounter for general adult medical examination without abnormal findings: Secondary | ICD-10-CM

## 2012-04-09 DIAGNOSIS — E785 Hyperlipidemia, unspecified: Secondary | ICD-10-CM

## 2012-04-09 DIAGNOSIS — I10 Essential (primary) hypertension: Secondary | ICD-10-CM | POA: Diagnosis not present

## 2012-04-09 DIAGNOSIS — N320 Bladder-neck obstruction: Secondary | ICD-10-CM | POA: Diagnosis not present

## 2012-04-09 MED ORDER — CARISOPRODOL 350 MG PO TABS: 350.0000 mg | ORAL_TABLET | Freq: Four times a day (QID) | ORAL | Status: DC | PRN

## 2012-04-09 MED ORDER — OXYCODONE-ACETAMINOPHEN 10-650 MG PO TABS: 1.0000 | ORAL_TABLET | ORAL | Status: DC | PRN

## 2012-04-09 MED ORDER — TRAMADOL HCL 50 MG PO TABS: 50.0000 mg | ORAL_TABLET | Freq: Four times a day (QID) | ORAL | Status: DC | PRN

## 2012-04-09 NOTE — Patient Instructions (Addendum)
Insomnia Insomnia is frequent trouble falling and/or staying asleep. Insomnia can be a long term problem or a short term problem. Both are common. Insomnia can be a short term problem when the wakefulness is related to a certain stress or worry. Long term insomnia is often related to ongoing stress during waking hours and/or poor sleeping habits. Overtime, sleep deprivation itself can make the problem worse. Every little thing feels more severe because you are overtired and your ability to cope is decreased. CAUSES   Stress, anxiety, and depression.   Poor sleeping habits.   Distractions such as TV in the bedroom.   Naps close to bedtime.   Engaging in emotionally charged conversations before bed.   Technical reading before sleep.   Alcohol and other sedatives. They may make the problem worse. They can hurt normal sleep patterns and normal dream activity.   Stimulants such as caffeine for several hours prior to bedtime.   Pain syndromes and shortness of breath can cause insomnia.   Exercise late at night.   Changing time zones may cause sleeping problems (jet lag).  It is sometimes helpful to have someone observe your sleeping patterns. They should look for periods of not breathing during the night (sleep apnea). They should also look to see how long those periods last. If you live alone or observers are uncertain, you can also be observed at a sleep clinic where your sleep patterns will be professionally monitored. Sleep apnea requires a checkup and treatment. Give your caregivers your medical history. Give your caregivers observations your family has made about your sleep.  SYMPTOMS   Not feeling rested in the morning.   Anxiety and restlessness at bedtime.   Difficulty falling and staying asleep.  TREATMENT   Your caregiver may prescribe treatment for an underlying medical disorders. Your caregiver can give advice or help if you are using alcohol or other drugs for  self-medication. Treatment of underlying problems will usually eliminate insomnia problems.   Medications can be prescribed for short time use. They are generally not recommended for lengthy use.   Over-the-counter sleep medicines are not recommended for lengthy use. They can be habit forming.   You can promote easier sleeping by making lifestyle changes such as:   Using relaxation techniques that help with breathing and reduce muscle tension.   Exercising earlier in the day.   Changing your diet and the time of your last meal. No night time snacks.   Establish a regular time to go to bed.   Counseling can help with stressful problems and worry.   Soothing music and white noise may be helpful if there are background noises you cannot remove.   Stop tedious detailed work at least one hour before bedtime.  HOME CARE INSTRUCTIONS   Keep a diary. Inform your caregiver about your progress. This includes any medication side effects. See your caregiver regularly. Take note of:   Times when you are asleep.   Times when you are awake during the night.   The quality of your sleep.   How you feel the next day.  This information will help your caregiver care for you.  Get out of bed if you are still awake after 15 minutes. Read or do some quiet activity. Keep the lights down. Wait until you feel sleepy and go back to bed.   Keep regular sleeping and waking hours. Avoid naps.   Exercise regularly.   Avoid distractions at bedtime. Distractions include watching television  or engaging in any intense or detailed activity like attempting to balance the household checkbook.   Develop a bedtime ritual. Keep a familiar routine of bathing, brushing your teeth, climbing into bed at the same time each night, listening to soothing music. Routines increase the success of falling to sleep faster.   Use relaxation techniques. This can be using breathing and muscle tension release routines. It can  also include visualizing peaceful scenes. You can also help control troubling or intruding thoughts by keeping your mind occupied with boring or repetitive thoughts like the old concept of counting sheep. You can make it more creative like imagining planting one beautiful flower after another in your backyard garden.   During your day, work to eliminate stress. When this is not possible use some of the previous suggestions to help reduce the anxiety that accompanies stressful situations.  MAKE SURE YOU:   Understand these instructions.   Will watch your condition.   Will get help right away if you are not doing well or get worse.  Document Released: 07/29/2000 Document Revised: 07/21/2011 Document Reviewed: 08/29/2007 [facility] Patient Information 2012 [place], [place].  Continue yearly flu vaccine. Schedule future labs for 6 weeks

## 2012-04-09 NOTE — Progress Notes (Signed)
Subjective:    Patient ID: Luis Guzman, male    DOB: 06/29/1946, 66 y.o.   MRN: [id number]  HPI  Patient here for Medicare wellness exam and followup chronic medical problems. History of hypertension, hyperlipidemia, osteoarthritis involving multiple joints. Recently had episode of chest discomfort. Went to emergency room. Ruled out for MI. Cardiac etiology ruled out. Patient had abdominal ultrasound and subsequent gallbladder nuclear study. This revealed low ejection fraction. He has followup with gastroenterologist. Recommendation to consider cholecystectomy and has not yet decided.  Hyperlipidemia treated with Crestor. Recently started per her cardiologist on this. He previously been on Zocor but had myalgias.  Difficulties with sleep recently. Difficulty falling asleep and staying asleep. Does use caffeine symptoms late day. No alcohol use. No daytime naps. No depression.  Immunizations up to date. Pneumovax 4 years ago.  Past Medical History  Diagnosis Date  . HYPERLIPIDEMIA 01/26/2009  . HYPERTENSION 01/26/2009  . 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 Appendectomy     age 22  . Spine surgery 1997    cervical laminectomy  . Rotator cuff repair     left 2004, right 2009  . Orif fibula fracture     compartment syndrome    reports that he quit smoking about 43 years ago. His smoking use included Cigarettes. He has a 5 pack-year smoking history. He does not have any smokeless tobacco history on file. He reports that he does not drink alcohol. His drug history not on file. family history includes Arthritis in his sister; Hypertension in his father and mother; and Stroke (age of onset:60) in his sister. Allergies  Allergen Reactions  . Penicillins     REACTION: itiching   1.  Risk factors based on Past Medical , Social, and Family history reviewed as above 2.  Limitations in physical activities low risk for fall 3.   Depression/mood mood stable 4.  Hearing no major deficits 5.  ADLs independent in all 6.  Cognitive function (orientation to time and place, language, writing, speech,memory) no memory deficits. Judgment intact. Language intact 7.  Home Safety no issues 8.  Height, weight, and visual acuity. Height and weight stable 9.  Counseling weight control  10. Recommendation of preventive services. Pneumovax on next year. Continue yearly flu vaccine. 11. Labs based on risk factors schedule labs with basic metabolic panel, lipid panel, hepatic panel, and PSA 12. Care Plan as above.    Review of Systems  Constitutional: Negative for fever, activity change, appetite change, fatigue and unexpected weight change.  HENT: Negative for ear pain, congestion and trouble swallowing.   Eyes: Negative for pain and visual disturbance.  Respiratory: Negative for cough, shortness of breath and wheezing.   Cardiovascular: Negative for chest pain and palpitations.  Gastrointestinal: Negative for nausea, vomiting, abdominal pain, diarrhea, constipation, blood in stool, abdominal distention and rectal pain.  Genitourinary: Negative for dysuria, hematuria and testicular pain.  Musculoskeletal: Positive for back pain and arthralgias. Negative for myalgias and joint swelling.  Skin: Negative for rash.  Neurological: Negative for dizziness, syncope and headaches.  Hematological: Negative for adenopathy.  Psychiatric/Behavioral: Negative for confusion and dysphoric mood.       Objective:   Physical Exam  Constitutional: He is oriented to person, place, and time. He appears well-developed and well-nourished. No distress.  HENT:  Head: Normocephalic and atraumatic.  Right Ear: External ear normal.  Left Ear: External ear normal.  Mouth/Throat: Oropharynx is clear and  moist.  Eyes: Conjunctivae and EOM are normal. Pupils are equal, round, and reactive to light.  Neck: Normal range of motion. Neck supple. No  thyromegaly present.  Cardiovascular: Normal rate, regular rhythm and normal heart sounds.   No murmur heard. Pulmonary/Chest: No respiratory distress. He has no wheezes. He has no rales.  Abdominal: Soft. Bowel sounds are normal. He exhibits no distension and no mass. There is no tenderness. There is no rebound and no guarding.  Genitourinary: Rectum normal and prostate normal.  Musculoskeletal: He exhibits no edema.  Lymphadenopathy:    He has no cervical adenopathy.  Neurological: He is alert and oriented to person, place, and time. He displays normal reflexes. No cranial nerve deficit.  Skin: No rash noted.  Psychiatric: He has a normal mood and affect.          Assessment & Plan:  #1 health maintenance. Patient will get yearly flu vaccine. Pneumovax by next year. Colonoscopy up to date.  #2 history of chronic intermittent low back pain. He uses muscle relaxer and oxycodone intermittently and sparingly and 1 refill given. #3 hypertension stable continue Benicar and Bystolic #4 hyperlipidemia. Schedule future lab with lipid and hepatic in 6 weeks as he just started Crestor one week ago #5 insomnia-sleep hygiene discussed.

## 2012-04-18 DIAGNOSIS — R143 Flatulence: Secondary | ICD-10-CM | POA: Diagnosis not present

## 2012-04-18 DIAGNOSIS — R141 Gas pain: Secondary | ICD-10-CM | POA: Diagnosis not present

## 2012-04-18 DIAGNOSIS — R072 Precordial pain: Secondary | ICD-10-CM | POA: Diagnosis not present

## 2012-04-18 DIAGNOSIS — R110 Nausea: Secondary | ICD-10-CM | POA: Diagnosis not present

## 2012-04-18 DIAGNOSIS — R198 Other specified symptoms and signs involving the digestive system and abdomen: Secondary | ICD-10-CM | POA: Diagnosis not present

## 2012-05-09 DIAGNOSIS — M67919 Unspecified disorder of synovium and tendon, unspecified shoulder: Secondary | ICD-10-CM | POA: Diagnosis not present

## 2012-05-11 ENCOUNTER — Other Ambulatory Visit: Payer: Self-pay | Admitting: Family Medicine

## 2012-05-11 ENCOUNTER — Telehealth: Payer: Self-pay | Admitting: Family Medicine

## 2012-05-11 DIAGNOSIS — R7309 Other abnormal glucose: Secondary | ICD-10-CM

## 2012-05-11 NOTE — Telephone Encounter (Signed)
Patient called stating that he would like to have an A1c added to his labs for 05/24/12. Please assist.

## 2012-05-11 NOTE — Telephone Encounter (Signed)
Pls advise.  

## 2012-05-11 NOTE — Telephone Encounter (Signed)
Added to labs

## 2012-05-11 NOTE — Telephone Encounter (Signed)
OK to add

## 2012-05-24 ENCOUNTER — Other Ambulatory Visit (INDEPENDENT_AMBULATORY_CARE_PROVIDER_SITE_OTHER): Payer: BC Managed Care – PPO

## 2012-05-24 DIAGNOSIS — N320 Bladder-neck obstruction: Secondary | ICD-10-CM

## 2012-05-24 DIAGNOSIS — R7309 Other abnormal glucose: Secondary | ICD-10-CM

## 2012-05-24 DIAGNOSIS — I10 Essential (primary) hypertension: Secondary | ICD-10-CM | POA: Diagnosis not present

## 2012-05-24 DIAGNOSIS — E785 Hyperlipidemia, unspecified: Secondary | ICD-10-CM | POA: Diagnosis not present

## 2012-05-24 LAB — LIPID PANEL
Cholesterol: 144 mg/dL (ref 0–200)
HDL: 35.5 mg/dL — ABNORMAL LOW (ref >39.00)
LDL Cholesterol: 84 mg/dL (ref 0–99)
Total CHOL/HDL Ratio: 4
Triglycerides: 125 mg/dL (ref 0.0–149.0)
VLDL: 25 mg/dL (ref 0.0–40.0)

## 2012-05-24 LAB — BASIC METABOLIC PANEL
BUN: 12 mg/dL (ref 6–23)
CO2: 26 mEq/L (ref 19–32)
Calcium: 8.8 mg/dL (ref 8.4–10.5)
Chloride: 107 mEq/L (ref 96–112)
Creatinine, Ser: 0.8 mg/dL (ref 0.4–1.5)
GFR: 98.47 mL/min (ref >60.00)
Glucose, Bld: 110 mg/dL — ABNORMAL HIGH (ref 70–99)
Potassium: 3.5 mEq/L (ref 3.5–5.1)
Sodium: 139 mEq/L (ref 135–145)

## 2012-05-24 LAB — PSA: PSA: 2.95 ng/mL (ref 0.10–4.00)

## 2012-05-24 LAB — HEPATIC FUNCTION PANEL
ALT: 16 U/L (ref 0–53)
AST: 22 U/L (ref 0–37)
Albumin: 3.7 g/dL (ref 3.5–5.2)
Alkaline Phosphatase: 80 U/L (ref 39–117)
Bilirubin, Direct: 0.1 mg/dL (ref 0.0–0.3)
Total Bilirubin: 0.6 mg/dL (ref 0.3–1.2)
Total Protein: 6.4 g/dL (ref 6.0–8.3)

## 2012-05-24 LAB — HEMOGLOBIN A1C: Hgb A1c MFr Bld: 6.3 % (ref 4.6–6.5)

## 2012-05-25 NOTE — Progress Notes (Signed)
Quick Note:  Pt informed on personally identified VM ______ 

## 2012-06-11 DIAGNOSIS — R143 Flatulence: Secondary | ICD-10-CM | POA: Diagnosis not present

## 2012-06-11 DIAGNOSIS — R072 Precordial pain: Secondary | ICD-10-CM | POA: Diagnosis not present

## 2012-06-11 DIAGNOSIS — R141 Gas pain: Secondary | ICD-10-CM | POA: Diagnosis not present

## 2012-06-20 DIAGNOSIS — I10 Essential (primary) hypertension: Secondary | ICD-10-CM | POA: Diagnosis not present

## 2012-06-20 DIAGNOSIS — E782 Mixed hyperlipidemia: Secondary | ICD-10-CM | POA: Diagnosis not present

## 2012-06-25 ENCOUNTER — Encounter: Payer: Self-pay | Admitting: Family Medicine

## 2012-06-25 ENCOUNTER — Ambulatory Visit (INDEPENDENT_AMBULATORY_CARE_PROVIDER_SITE_OTHER): Payer: Medicare Other | Admitting: Family Medicine

## 2012-06-25 VITALS — BP 142/80 | Temp 98.6°F | Wt 215.0 lb

## 2012-06-25 DIAGNOSIS — M546 Pain in thoracic spine: Secondary | ICD-10-CM | POA: Diagnosis not present

## 2012-06-25 DIAGNOSIS — I10 Essential (primary) hypertension: Secondary | ICD-10-CM | POA: Diagnosis not present

## 2012-06-25 DIAGNOSIS — E119 Type 2 diabetes mellitus without complications: Secondary | ICD-10-CM | POA: Diagnosis not present

## 2012-06-25 DIAGNOSIS — R739 Hyperglycemia, unspecified: Secondary | ICD-10-CM | POA: Insufficient documentation

## 2012-06-25 DIAGNOSIS — M549 Dorsalgia, unspecified: Secondary | ICD-10-CM

## 2012-06-25 MED ORDER — METHOCARBAMOL 500 MG PO TABS: 500.0000 mg | ORAL_TABLET | Freq: Three times a day (TID) | ORAL | Status: DC

## 2012-06-25 NOTE — Progress Notes (Signed)
  Subjective:    Patient ID: Luis Guzman, male    DOB: 06/19/1946, 66 y.o.   MRN: [id number]  HPI  Patient is here to have forms completed for [facility] health system. He had possible agent orange exposure many years ago. He had elevated blood sugars over 126 on 2 occasions as outlined below and recent A1c 6.6% through [place] [facility]. He brings in a sheet of possible associations with agent orange and medical conditions including type 2 diabetes. Patient has no symptoms of hyperglycemia. He had fasting blood sugar on 10/20/2011 129 and fasting blood sugar 145 02/14/2012. No weight loss.  Patient also presents with some intermittent left-sided upper back and neck pain and stiffness. He has seen neurosurgeon previously. No radiculopathy symptoms. Frequent has stiffness on turning neck to the left. Has previously used soma but had nausea. He would like to explore possible other muscle relaxer for as needed use.  Hypertension treated with Benicar, HCTZ, and Bystolic.  Blood pressures been stable. No recent headaches. No dizziness. No chest pain.  Past Medical History  Diagnosis Date  . HYPERLIPIDEMIA 01/26/2009  . HYPERTENSION 01/26/2009  . 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 Appendectomy     age 68  . Spine surgery 1997    cervical laminectomy  . Rotator cuff repair     left 2004, right 2009  . Orif fibula fracture     compartment syndrome    reports that he quit smoking about 43 years ago. His smoking use included Cigarettes. He has a 5 pack-year smoking history. He does not have any smokeless tobacco history on file. He reports that he does not drink alcohol. His drug history not on file. family history includes Arthritis in his sister; Hypertension in his father and mother; and Stroke (age of onset:60) in his sister. Allergies  Allergen Reactions  . Penicillins     REACTION: itiching      Review of Systems  Constitutional:  Negative for fatigue.  Eyes: Negative for visual disturbance.  Respiratory: Negative for cough, chest tightness and shortness of breath.   Cardiovascular: Negative for chest pain, palpitations and leg swelling.  Neurological: Negative for dizziness, syncope, weakness, light-headedness and headaches.       Objective:   Physical Exam  Constitutional: He appears well-developed and well-nourished.  Neck: Neck supple.  Cardiovascular: Normal rate and regular rhythm.   Pulmonary/Chest: Effort normal and breath sounds normal. No respiratory distress. He has no wheezes. He has no rales.  Musculoskeletal: He exhibits no edema.  Lymphadenopathy:    He has no cervical adenopathy.          Assessment & Plan:  #1 type 2 diabetes. He meets criteria based on 2 fasting blood sugars greater than 126. He is asymptomatic. Would recommend working on weight loss and recheck A1c in 6 months. #2 upper back/cervical neck pain. Nonfocal neurologic exam. Trial of Robaxin 500 mg one every 8 hours as needed for muscle spasm. Patient will followup with neurosurgeon if symptoms persist or worsen #3 hypertension. Stable. Continue current medications

## 2012-08-16 ENCOUNTER — Other Ambulatory Visit: Payer: Self-pay | Admitting: *Deleted

## 2012-08-16 MED ORDER — TRAMADOL HCL 50 MG PO TABS: 50.0000 mg | ORAL_TABLET | Freq: Four times a day (QID) | ORAL | Status: DC | PRN

## 2012-10-09 DIAGNOSIS — R072 Precordial pain: Secondary | ICD-10-CM | POA: Diagnosis not present

## 2012-12-25 ENCOUNTER — Telehealth: Payer: Self-pay | Admitting: Internal Medicine

## 2012-12-25 MED ORDER — NEBIVOLOL HCL 5 MG PO TABS: 5.0000 mg | ORAL_TABLET | Freq: Every day | ORAL | Status: DC

## 2012-12-25 MED ORDER — OLMESARTAN MEDOXOMIL 40 MG PO TABS: 40.0000 mg | ORAL_TABLET | Freq: Every day | ORAL | Status: DC

## 2012-12-25 MED ORDER — HYDROCHLOROTHIAZIDE 12.5 MG PO CAPS: 12.5000 mg | ORAL_CAPSULE | ORAL | Status: DC

## 2012-12-25 NOTE — Telephone Encounter (Signed)
Luis Guzman needs a new prescription faxed over to [facility] so that they can start refilling his medication. The number to call is [phone]. Bystolic 5mg , Benicar 40mg / HCTZ 12.5

## 2012-12-25 NOTE — Telephone Encounter (Signed)
Sent refill request electronically. To [facility].

## 2012-12-25 NOTE — Telephone Encounter (Signed)
Wanted you to know that he does need new written prescriptions!

## 2013-01-30 DIAGNOSIS — M5412 Radiculopathy, cervical region: Secondary | ICD-10-CM | POA: Diagnosis not present

## 2013-02-04 ENCOUNTER — Other Ambulatory Visit (HOSPITAL_COMMUNITY): Payer: Self-pay | Admitting: Gastroenterology

## 2013-02-04 DIAGNOSIS — R1013 Epigastric pain: Secondary | ICD-10-CM

## 2013-02-05 DIAGNOSIS — M542 Cervicalgia: Secondary | ICD-10-CM | POA: Diagnosis not present

## 2013-02-05 DIAGNOSIS — M5412 Radiculopathy, cervical region: Secondary | ICD-10-CM | POA: Diagnosis not present

## 2013-02-07 DIAGNOSIS — M542 Cervicalgia: Secondary | ICD-10-CM | POA: Diagnosis not present

## 2013-02-07 DIAGNOSIS — M5412 Radiculopathy, cervical region: Secondary | ICD-10-CM | POA: Diagnosis not present

## 2013-02-12 DIAGNOSIS — M5412 Radiculopathy, cervical region: Secondary | ICD-10-CM | POA: Diagnosis not present

## 2013-02-12 DIAGNOSIS — M542 Cervicalgia: Secondary | ICD-10-CM | POA: Diagnosis not present

## 2013-02-18 ENCOUNTER — Encounter (HOSPITAL_COMMUNITY)
Admission: RE | Admit: 2013-02-18 | Discharge: 2013-02-18 | Disposition: A | Discharge status: Home / Self Care | Payer: Medicare Other | Source: Ambulatory Visit | Attending: Gastroenterology | Admitting: Gastroenterology

## 2013-02-18 DIAGNOSIS — R1013 Epigastric pain: Secondary | ICD-10-CM | POA: Diagnosis not present

## 2013-02-18 MED ORDER — TECHNETIUM TC 99M MEBROFENIN IV KIT
5.0000 | PACK | Freq: Once | INTRAVENOUS | Status: AC | PRN
  Administered 2013-02-18: 5 via INTRAVENOUS

## 2013-03-13 DIAGNOSIS — M5412 Radiculopathy, cervical region: Secondary | ICD-10-CM | POA: Diagnosis not present

## 2013-03-14 ENCOUNTER — Other Ambulatory Visit: Payer: Self-pay | Admitting: Neurological Surgery

## 2013-03-14 DIAGNOSIS — M5412 Radiculopathy, cervical region: Secondary | ICD-10-CM

## 2013-03-17 ENCOUNTER — Ambulatory Visit
Admission: RE | Admit: 2013-03-17 | Discharge: 2013-03-17 | Disposition: A | Discharge status: Home / Self Care | Payer: Medicare Other | Source: Ambulatory Visit | Attending: Neurological Surgery | Admitting: Neurological Surgery

## 2013-03-17 ENCOUNTER — Other Ambulatory Visit: Payer: Medicare Other

## 2013-03-17 DIAGNOSIS — M47812 Spondylosis without myelopathy or radiculopathy, cervical region: Secondary | ICD-10-CM | POA: Diagnosis not present

## 2013-03-17 DIAGNOSIS — M5412 Radiculopathy, cervical region: Secondary | ICD-10-CM

## 2013-03-20 ENCOUNTER — Other Ambulatory Visit: Payer: Medicare Other

## 2013-03-21 DIAGNOSIS — M5412 Radiculopathy, cervical region: Secondary | ICD-10-CM | POA: Diagnosis not present

## 2013-04-01 ENCOUNTER — Other Ambulatory Visit: Payer: Self-pay | Admitting: Family Medicine

## 2013-04-01 NOTE — Telephone Encounter (Signed)
Last refill 08/16/12 Pt does have a upcoming  Appointment 04/11/13

## 2013-04-01 NOTE — Telephone Encounter (Signed)
Refill once 

## 2013-04-11 ENCOUNTER — Other Ambulatory Visit: Payer: Self-pay

## 2013-04-11 ENCOUNTER — Telehealth: Payer: Self-pay

## 2013-04-11 ENCOUNTER — Ambulatory Visit (INDEPENDENT_AMBULATORY_CARE_PROVIDER_SITE_OTHER): Payer: Medicare Other | Admitting: Family Medicine

## 2013-04-11 ENCOUNTER — Encounter: Payer: Self-pay | Admitting: Family Medicine

## 2013-04-11 VITALS — BP 110/80 | Temp 98.5°F | Ht 73.0 in | Wt 192.0 lb

## 2013-04-11 DIAGNOSIS — N320 Bladder-neck obstruction: Secondary | ICD-10-CM | POA: Diagnosis not present

## 2013-04-11 DIAGNOSIS — E119 Type 2 diabetes mellitus without complications: Secondary | ICD-10-CM | POA: Diagnosis not present

## 2013-04-11 DIAGNOSIS — I10 Essential (primary) hypertension: Secondary | ICD-10-CM | POA: Diagnosis not present

## 2013-04-11 DIAGNOSIS — E785 Hyperlipidemia, unspecified: Secondary | ICD-10-CM

## 2013-04-11 DIAGNOSIS — Z0000 Encounter for general adult medical examination without abnormal findings: Secondary | ICD-10-CM

## 2013-04-11 DIAGNOSIS — M159 Polyosteoarthritis, unspecified: Secondary | ICD-10-CM

## 2013-04-11 LAB — LIPID PANEL
Cholesterol: 206 mg/dL — ABNORMAL HIGH (ref 0–200)
HDL: 40.2 mg/dL (ref >39.00)
Total CHOL/HDL Ratio: 5
Triglycerides: 179 mg/dL — ABNORMAL HIGH (ref 0.0–149.0)
VLDL: 35.8 mg/dL (ref 0.0–40.0)

## 2013-04-11 LAB — BASIC METABOLIC PANEL
BUN: 15 mg/dL (ref 6–23)
CO2: 28 mEq/L (ref 19–32)
Calcium: 9.7 mg/dL (ref 8.4–10.5)
Chloride: 102 mEq/L (ref 96–112)
Creatinine, Ser: 0.9 mg/dL (ref 0.4–1.5)
GFR: 95.55 mL/min (ref >60.00)
Glucose, Bld: 101 mg/dL — ABNORMAL HIGH (ref 70–99)
Potassium: 4.1 mEq/L (ref 3.5–5.1)
Sodium: 138 mEq/L (ref 135–145)

## 2013-04-11 LAB — HEPATIC FUNCTION PANEL
ALT: 16 U/L (ref 0–53)
AST: 24 U/L (ref 0–37)
Albumin: 4.4 g/dL (ref 3.5–5.2)
Alkaline Phosphatase: 76 U/L (ref 39–117)
Bilirubin, Direct: 0.1 mg/dL (ref 0.0–0.3)
Total Bilirubin: 0.6 mg/dL (ref 0.3–1.2)
Total Protein: 7.3 g/dL (ref 6.0–8.3)

## 2013-04-11 LAB — HEMOGLOBIN A1C: Hgb A1c MFr Bld: 6.1 % (ref 4.6–6.5)

## 2013-04-11 LAB — PSA: PSA: 3.22 ng/mL (ref 0.10–4.00)

## 2013-04-11 MED ORDER — OXYCODONE-ACETAMINOPHEN 10-650 MG PO TABS: 1.0000 | ORAL_TABLET | ORAL | Status: DC | PRN

## 2013-04-11 MED ORDER — OLMESARTAN MEDOXOMIL 40 MG PO TABS: 40.0000 mg | ORAL_TABLET | Freq: Every day | ORAL | Status: DC

## 2013-04-11 MED ORDER — HYDROCHLOROTHIAZIDE 12.5 MG PO CAPS: 12.5000 mg | ORAL_CAPSULE | ORAL | Status: DC

## 2013-04-11 MED ORDER — CARISOPRODOL 350 MG PO TABS: 350.0000 mg | ORAL_TABLET | Freq: Three times a day (TID) | ORAL | Status: DC | PRN

## 2013-04-11 MED ORDER — NEBIVOLOL HCL 5 MG PO TABS: 5.0000 mg | ORAL_TABLET | Freq: Every day | ORAL | Status: DC

## 2013-04-11 NOTE — Progress Notes (Signed)
Subjective:    Patient ID: Luis Guzman, male    DOB: 08/23/1945, 67 y.o.   MRN: [id number]  HPI Patient for Medicare wellness exam and medical followup His chronic problems including type 2 diabetes diet controlled, osteoarthritis, hypertension, hyperlipidemia. Recently had some cervical neck pain followed by neurosurgeon. Immunizations are reviewed and up-to-date. He will need Pneumovax booster and tetanus booster next year.  Blood pressures been well controlled with Benicar. He also takes HCTZ and Bystolic. Compliant with all medications. No chest pains. He's lost about 15 pounds in the past several months due to his efforts. No symptoms of hyperglycemia. Last A1c was low 6 range.  He has some ongoing issues of intermittent abdominal pain. Followed by gastroenterology. Gallbladder nuclear studies have been normal Occasional nocturia.    Past Medical History  Diagnosis Date  . HYPERLIPIDEMIA 01/26/2009  . HYPERTENSION 01/26/2009  . 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 Appendectomy      age 6  . Spine surgery  1997    cervical laminectomy  . Rotator cuff repair      left 2004, right 2009  . Orif fibula fracture      compartment syndrome    reports that he quit smoking about 44 years ago. His smoking use included Cigarettes. He has a 5 pack-year smoking history. He does not have any smokeless tobacco history on file. He reports that he does not drink alcohol. His drug history is not on file. family history includes Arthritis in his sister; Hypertension in his father and mother; Stroke (age of onset: 39) in his sister. Allergies  Allergen Reactions  . Penicillins     REACTION: itiching   1.  Risk factors based on Past Medical , Social, and Family history as above 2.  Limitations in physical activities no falls 3.  Depression/mood no anxiety or depression issues  4.  Hearing no deficits 5.  ADLs  independent in all 6.  Cognitive function (orientation to time and place, language, writing, speech,memory) no memory deficits. 7.  Home Safety no issues 8.  Height, weight, and visual acuity. Intentional weight loss as above 9.  Counseling diet and exercise discussed 10. Recommendation of preventive services. Reminder for flu vaccine this fall 11. Labs based on risk factors will see, lipid panel, hepatic panel, basic metabolic panel 12. Care Plan as above    Review of Systems  Constitutional: Negative for fever, activity change, appetite change and fatigue.  HENT: Negative for ear pain, congestion and trouble swallowing.   Eyes: Negative for pain and visual disturbance.  Respiratory: Negative for cough, shortness of breath and wheezing.   Cardiovascular: Negative for chest pain and palpitations.  Gastrointestinal: Positive for abdominal pain (intermittent and chronic). Negative for nausea, vomiting, diarrhea, constipation, blood in stool, abdominal distention and rectal pain.  Genitourinary: Negative for dysuria, hematuria and testicular pain.  Musculoskeletal: Negative for joint swelling and arthralgias.  Skin: Negative for rash.  Neurological: Negative for dizziness, syncope and headaches.  Hematological: Negative for adenopathy.  Psychiatric/Behavioral: Negative for confusion and dysphoric mood.       Objective:   Physical Exam  Constitutional: He is oriented to person, place, and time. He appears well-developed and well-nourished. No distress.  HENT:  Head: Normocephalic and atraumatic.  Right Ear: External ear normal.  Left Ear: External ear normal.  Mouth/Throat: Oropharynx is clear and moist.  Eyes: Conjunctivae and EOM are normal. Pupils are equal, round, and  reactive to light.  Neck: Normal range of motion. Neck supple. No thyromegaly present.  Cardiovascular: Normal rate, regular rhythm and normal heart sounds.   No murmur heard. Pulmonary/Chest: No respiratory  distress. He has no wheezes. He has no rales.  Abdominal: Soft. Bowel sounds are normal. He exhibits no distension and no mass. There is no tenderness. There is no rebound and no guarding.  Musculoskeletal: He exhibits no edema.  Lymphadenopathy:    He has no cervical adenopathy.  Neurological: He is alert and oriented to person, place, and time. He displays normal reflexes. No cranial nerve deficit.  Skin: No rash noted.  Psychiatric: He has a normal mood and affect.          Assessment & Plan:  #1 health maintenance. Flu vaccine this fall. Tetanus and Pneumovax booster next year. Continue healthy lifestyle habits #2 hypertension. Stable. Refilled medications for one year. Check basic metabolic panel #3 hyperlipidemia. Intolerant of multiple statins. Remains on low-dose Crestor 10 mg once weekly. Recheck lipid panel #4 osteoarthritis involving multiple joints. Refill oxycodone for as needed use. 1 refill of soma

## 2013-04-11 NOTE — Telephone Encounter (Signed)
Pt was just given Oxycodone (Percocet) 10-650 mg, 650 is no longer available. Is it okay to change to 10/325 mg  Patient was given #30 no refill

## 2013-04-11 NOTE — Patient Instructions (Signed)
Flu vaccine this Fall We need to boost your pneumovax by next year.

## 2013-04-11 NOTE — Telephone Encounter (Signed)
Yes.  OK to make change.  Sorry I did not catch that.

## 2013-04-12 LAB — LDL CHOLESTEROL, DIRECT: Direct LDL: 139.9 mg/dL

## 2013-04-12 NOTE — Telephone Encounter (Signed)
[facility] [place]  Advanced Heart Failure and Transplant  Heart Transplant Clinic  Follow-up Visit    Primary Care Physician: Brodsky, Mark E  Referring Provider: Brett Justin Berman  Date of Transplant: 10/04/2019  Organ(s) Transplanted: heart  Indication for transplant: Dilated Myopathy: Idiopathic  PHS increased risk donor: Yes    ID. 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    Interval History:    The patient was last seen on 11/29/21. At that time issues with pain after urologic procedure.    He continues to deal with pain issue largely from prostate surgery. He still has some bleeding and some tissue come out. He tried different strategies and nothing helped. This is really impacting quality of life. He gets tired and frustrated and does not want to take it out on his family.    ROS:  A complete ROS was performed and is negative except as documented in the HPI.      Allergies:  Patient is allergic to cats [other] and dogs [other].    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    Pericardial effusion    Hypertension    Chronic back pain    At risk for infection transmitted from donor    Acute hepatitis C virus infection    Heart transplanted (CMS-HCC)    Acute UTI    Umbilical hernia without obstruction and without gangrene    COVID-19 virus detected    Acute medial meniscus tear of left knee, sequela    Localized osteoarthritis of left knee     Past Surgical History:   Procedure Laterality Date    CARDIAC DEFIBRILLATOR PLACEMENT       PB ANESTH,SHOULDER JOINT,NOS Right      Family History   Problem Relation Name Age of Onset    Hypertension Other      Other Maternal Grandmother          kidney disease needing HD     Social History     Socioeconomic History    Marital status: Single     Spouse name: Not on file    Number of children: Not on file    Years of education: Not on file    Highest education level: Not on file   Occupational History    Not on file   Tobacco Use    Smoking status: Never    Smokeless tobacco: Never    Tobacco comments:     from friends and relatives    Substance and Sexual Activity    Alcohol use: Not Currently     Comment: Prior heavier use, but completely quit in 2016    Drug use: Yes     Comment: eats edible marijuana for pain and insomnia     Sexual activity: Not on file   Other Topics Concern    Not on file   Social History Narrative      Born in [place], also lived in [place], [place], [place], no travel, worked as a carpenter, occasional cedar, no birds, no hot tubs, worked in construction + possible asbestos exposure      Social Determinants of Health     Financial Resource Strain: Not on file   Food Insecurity: Not on file   Transportation Needs: Not on file   Physical Activity: Not on file   Stress: Not on file   Social Connections: Not on file   Intimate Partner Violence: Not on file   Housing Stability: Not on file     Current Outpatient Medications   Medication Sig    albuterol 108 (90 Base) MCG/ACT inhaler Inhale 2 puffs by mouth every 4 hours as needed for Wheezing or Shortness of Breath.    aspirin 81 MG EC tablet Take 1 tablet (81 mg) by mouth daily.    baclofen (LIORESAL) 10 MG tablet Take 2 tablets (20 mg) by mouth nightly.    Blood Glucose Monitoring Suppl (TRUE METRIX METER) w/Device KIT Use as directed    budesonide-formoterol (SYMBICORT) 160-4.5 MCG/ACT inhaler Inhale 2 puffs by mouth every 12 hours.    bumetanide (BUMEX) 1 MG tablet Take 1 tablet (1 mg) by mouth daily as needed (fluid/weight  gain). Do not take unless instructed by Transplant team.    Calcium Carb-Cholecalciferol 600-10 MG-MCG TABS Take 1 tablet by mouth 2 times daily.    Cetirizine HCl (ZERVIATE) 0.24 % SOLN Place 1 drop into both eyes 2 times daily.    clindamycin (CLEOCIN T) 1 % solution Apply 1 Application. topically 2 times daily. Apply to the red bumps on your face up to two times a day.    controlled substance agreement controlled substance agreement    diclofenac (VOLTAREN) 1 % gel Apply 2 g topically 4 times daily.    docusate sodium (COLACE) 100 MG capsule Take 1 capsule (100 mg) by mouth 2 times daily.    DULoxetine (CYMBALTA) 30 MG CR capsule Take 1 capsule (30 mg) by mouth daily.    famotidine (PEPCID) 20 MG tablet Take 1 tablet (20 mg) by mouth 2 times daily.    fluticasone propionate (FLONASE) 50 MCG/ACT nasal spray Spray 1 spray into each nostril 2 times daily.    gabapentin (NEURONTIN) 300 MG capsule Take 1 capsule (300 mg) by mouth every morning AND 1 capsule (300 mg) daily AND 2 capsules (600 mg) every evening.    hydroCHLOROthiazide (HYDRODIURIL) 25 MG tablet Take 1 tablet (25 mg) by mouth daily.    ketoconazole (NIZORAL) 2 % shampoo Use shampoo daily for dandruff    lidocaine (LIDOCAINE PAIN RELIEF) 4 % patch Apply 1 patch topically every 24 hours. Leave patch on for 12 hours, then remove for 12 hours.    lisinopril (PRINIVIL, ZESTRIL) 10 MG tablet Take 2 tablets (20 mg) by mouth daily.    magnesium oxide (MAG-OX) 400 MG tablet Take 1 tablet by mouth daily    melatonin (GNP MELATONIN MAXIMUM STRENGTH) 5 MG tablet Take 2 tablets (10 mg) by mouth at bedtime.    Multiple Vitamin (MULTIVITAMIN) TABS tablet Take 1 tablet by mouth daily.    naloxone (KLOXXADO) 8 mg/0.1 mL nasal spray Call 911! Tilt head and spray intranasally into one nostril as needed for respiratory depression. If patient does not respond or responds and then relapses, repeat using a new nasal spray every 3 minutes until emergency medical assistance  arrives.    NEEDLE, DISP, 25 G 25G X   1" MISC Use to inject testosterone    NIFEdipine (ADALAT CC) 30 MG Controlled-Release tablet Take 1 tablet (30 mg) by mouth nightly.    ondansetron (ZOFRAN) 8 MG tablet Take 1 tablet (8 mg) by mouth every 8 hours as needed for Nausea/Vomiting.    oxyCODONE (ROXICODONE) 10 MG tablet Take 1 tab every 4 hours as needed for moderate pain and 2 tabs every 4 hours as needed for severe pain. Max 10 tabs per day, 28 day supply    phenazopyridine (PYRIDIUM) 100 MG tablet Take 1 tablet (100 mg) by mouth 3 times daily.    polyethylene glycol (GLYCOLAX) 17 GM/SCOOP powder Mix 17 grams in 4-8 oz of liquide and drink by mouth daily as needed (Constipation).    pravastatin (PRAVACHOL) 40 MG tablet Take 1 tablet (40 mg) by mouth every evening.    senna (SENOKOT) 8.6 MG tablet Take 1 tablet (8.6 mg) by mouth daily.    sirolimus (RAPAMUNE) 1 MG tablet Take 2 tablets (2 mg) by mouth every morning.    SYRINGE-NEEDLE, DISP, 3 ML (B-D 3CC LUER-LOK SYR 25GX1") 25G X 1" 3 ML MISC Use as directed to inject testosterone    SYRINGE-NEEDLE, DISP, 3 ML 18G X 1-1/2" 3 ML MISC Use to draw up testosterone    tacrolimus (ENVARSUS XR) 1 MG tablet STOP TAKING since 04/27/22 - remaining on chart for dose adjustments, titratable med.    tacrolimus (ENVARSUS XR) 4 MG tablet Take 1 tablet (4 mg) by mouth every morning.    tamsulosin (FLOMAX) 0.4 MG capsule Take 1 capsule (0.4 mg) by mouth daily.    tamsulosin (FLOMAX) 0.4 MG capsule Take 1 capsule (0.4 mg) by mouth daily.    testosterone cypionate (DEPO-TESTOSTERONE) 200 MG/ML SOLN Inject 1 ml into the muscle every 14 days    traZODone (DESYREL) 50 MG tablet Take 1 tablet (50 mg) by mouth nightly.     Current Facility-Administered Medications   Medication    diphenhydrAMINE (BENADRYL) injection 50 mg    diphenhydrAMINE (BENADRYL) tablet 50 mg     Immunization History   Administered Date(s) Administered    COVID-19 (Moderna) Low Dose Red Cap >= 18 Years 09/25/2020     COVID-19 (Moderna) Red Cap >= 12 Years 11/02/2019, 12/02/2019, 04/01/2020    Hep-A/Hep-B; Twinrix, Adult 10/26/2020    Influenza Vaccine (High Dose) Quadrivalent >=65 Years 06/17/2020    Influenza Vaccine (Unspecified) 04/15/2017    Influenza Vaccine >=6 Months 06/28/2010, 06/28/2011, 08/23/2012, 05/21/2014, 05/04/2018, 05/20/2019    Pneumococcal 13 Vaccine (PREVNAR-13) 06/17/2020    Pneumococcal 23 Vaccine (PNEUMOVAX-23) 06/28/2013, 10/26/2020    Tdap 08/16/2011   Deferred Date(s) Deferred    Pneumococcal 23 Vaccine (PNEUMOVAX-23) 10/17/2019     Physical Exam:  BP 102/69 (BP Location: Right arm, BP Patient Position: Sitting, BP cuff size: Large)   Pulse 98   Temp 98.5 F (36.9 C) (Temporal)   Resp 16   Ht 5' 10" (1.778 m)   Wt 96.2 kg (212 lb)   SpO2 97%   BMI 30.42 kg/m      General Appearance: ***alert, no distress, pleasant affect, cooperative.  Heart:  JVD ***, PMI ***, normal rate and regular rhythm, no murmurs, clicks, or gallops. ***  Lungs: ***clear to auscultation and percussion. No rales, rhonchi, or wheezes noted. No chest deformities noted.  Abdomen: ***BS normal.  Abdomen soft, non-tender.  No masses or organomegaly.  Extremities:  ***no cyanosis, clubbing, or edema. Has 2+ peripheral pulses.        Lab Data:  Lab Results   Component Value Date    BUN 26 (H) 04/27/2022    CREAT 1.98 (H) 04/27/2022    CL 99 04/27/2022    NA 140 04/27/2022    K 4.4 04/27/2022    CA 9.2 04/27/2022    TBILI 0.47 04/27/2022    ALB 4.1 04/27/2022    TP 7.1 04/27/2022    AST 22 04/27/2022    ALK 76 04/27/2022    BICARB 29 04/27/2022    ALT 25 04/27/2022    GLU 126 (H) 04/27/2022     Lab Results   Component Value Date    WBC 7.9 04/27/2022    RBC 5.50 04/27/2022    HGB 15.2 04/27/2022    HCT 46.5 04/27/2022    MCV 84.5 04/27/2022    MCHC 32.7 04/27/2022    RDW 12.3 04/27/2022    PLT 162 04/27/2022    MPV 11.6 04/27/2022     Lab Results   Component Value Date    A1C 5.7 09/24/2021     Lab Results   Component Value Date     TSH 1.63 09/24/2021     Lab Results   Component Value Date    CHOL 105 09/24/2021    HDL 38 09/24/2021    LDLCALC 43 09/24/2021    TRIG 121 09/24/2021     Lab Results   Component Value Date    SIROT 11.5 04/27/2022     Lab Results   Component Value Date    FKTR 6.5 04/27/2022     No results found for: CSATR  Lab Results   Component Value Date    CMVPL Not Detected 07/16/2021     Lab Results   Component Value Date    DSA ABSENT 04/27/2022       Prior Cardiovascular Studies:   Lab Results   Component Value Date    LV Ejection Fraction 59 10/21/2021          Echo 10/21/21  Summary:   1. The left ventricular size is normal. The left ventricular systolic function is normal.   2. No left ventricular hypertrophy.   3. Normal pattern of left ventricular diastolic filling.   4. EF=59%.   5. Compared to prior study EF now 59%, was 69% 11/13/20.     LHC/IVUS 10/19/21  CONCLUSION:                                                                   1. 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                              2. No angiographic evidence of coronary artery disease.                      3. Intimal thickness noted in LAD/LM up to 0.5 mm (Stable to slightly       worse compare to 2022).                                                         4. Non significant FFR at apical LAD.                                        5. Left ventricular end diastolic pressure appears normal.        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    Assessment/Plan:  # Hematuria  # Dysuria  # Chronic pain  Assessment: We had a long frank discussion about patient's chronic pain issues and the heart transplant team's role in this. I discussed with him that when I initially agreed to cover his chronic opiate prescription, this was the assumption that he would  have a provider versed in chronic pain after 3-4 months, but we are at 6 months and has unable to find one. Additionally, I had not put him on a pain contract at that time, but he recently used more opiates without asking and I informed him this was not appropriate, but because he had not established guidelines I was not going to stop at this time. However, going forward until he can establish with a pain physician, we will set up a pain contract and he will need to follow through like a usual pain clinic with us with goal of provider in 3-4 months or I may start tapering. I will augment adjuvant agents additionally for now and we can continue to work on this.  Plan:  -pain contract signed  -urine tox monthly  -clinic follow up month  -oxycodone 10 mg tablets PO, 1 tab every 4 hours moderate pain, 2 tabs every 4 hours for severe pain, no more than 10 tablets a day, total 280 per 28 days.   -diclofenac cream for joint pain  -lidocaine patch for back pain  -trial of pyridium  -increase gaba at night  -siro change as below  -cymbalta as below    # End-stage heart failure s/p orthotopic heart transplant  # Chronic Immunosuppression/Immunomodulation  Assessment: While we thought continuing sirolimus would help prevent recurrent scar tissue from prostate procedure, it may be exacerbating factors now with delayed wound healing. Will try mmf for 1 month.  Plan:   - continue envarsus 6 mg daily, goal trough 4-8  - HOLD sirolimus 3 mg daily, goal trough 4-8 for at least 1 month  - start mmf 1000 mg bid for one month to allow healing  - Continue to monitor for renal toxicities, infection risk and malignancy risk  - continue pravastatin 40 mg daily  - continue aspirin 81 mg daily    # Hypertension  Assessment: controlled  Plan:  -continue lisinopril 20 mg daily  -resume hctz  -nifedipine 30 mg daily    # Dyslipidemia  -continue pravastatin 40 mg daily    # Depression  Assessment: improved mood  Plan:  -increase cymbalta to 120  mg daily     RTC in 1 month       Nicholas W Wettersten, MD  Advanced Heart Failure, Mechanical Circulatory Support, Transplant  Pgr: [phone]

## 2013-04-16 ENCOUNTER — Encounter: Payer: Self-pay | Admitting: Family Medicine

## 2013-04-30 DIAGNOSIS — Q7649 Other congenital malformations of spine, not associated with scoliosis: Secondary | ICD-10-CM | POA: Diagnosis not present

## 2013-04-30 DIAGNOSIS — M5412 Radiculopathy, cervical region: Secondary | ICD-10-CM | POA: Diagnosis not present

## 2013-04-30 DIAGNOSIS — M542 Cervicalgia: Secondary | ICD-10-CM | POA: Diagnosis not present

## 2013-05-07 DIAGNOSIS — M19049 Primary osteoarthritis, unspecified hand: Secondary | ICD-10-CM | POA: Diagnosis not present

## 2013-06-07 DIAGNOSIS — D481 Neoplasm of uncertain behavior of connective and other soft tissue: Secondary | ICD-10-CM | POA: Diagnosis not present

## 2013-06-07 DIAGNOSIS — M6740 Ganglion, unspecified site: Secondary | ICD-10-CM | POA: Diagnosis not present

## 2013-06-07 DIAGNOSIS — M7130 Other bursal cyst, unspecified site: Secondary | ICD-10-CM | POA: Diagnosis not present

## 2013-06-10 ENCOUNTER — Encounter: Payer: Self-pay | Admitting: Family Medicine

## 2013-06-10 ENCOUNTER — Ambulatory Visit (INDEPENDENT_AMBULATORY_CARE_PROVIDER_SITE_OTHER): Payer: Medicare Other | Admitting: Family Medicine

## 2013-06-10 VITALS — BP 110/80 | Temp 98.6°F | Wt 190.0 lb

## 2013-06-10 DIAGNOSIS — J209 Acute bronchitis, unspecified: Secondary | ICD-10-CM | POA: Diagnosis not present

## 2013-06-10 MED ORDER — PREDNISONE 20 MG PO TABS: ORAL_TABLET | ORAL | Status: DC

## 2013-06-10 MED ORDER — HYDROCODONE-HOMATROPINE 5-1.5 MG/5ML PO SYRP: 5.0000 mL | ORAL_SOLUTION | Freq: Three times a day (TID) | ORAL | Status: DC | PRN

## 2013-06-10 NOTE — Patient Instructions (Signed)
Drink lots of water  Take the prednisone as directed  Hydromet 1/2-1 teaspoon 3 times daily. For cough

## 2013-06-10 NOTE — Progress Notes (Signed)
  Subjective:    Patient ID: Luis Guzman, male    DOB: 1945-10-16, 67 y.o.   MRN: [id number]  HPI [name] is a 67 year old married male nonsmoker who comes in today for evaluation of a cough  He said this started about a week ago with a cough and over the last 2-3 days is gone worse. He feels tightness in his chest and wheezing. He has no fever or sputum production. He is a nonsmoker   Review of Systems    review of systems otherwise negative Objective:   Physical Exam  Well-developed and nourished male no acute distress HEENT negative neck was supple no adenopathy lungs are clear except for symmetrical expiratory wheezing moderate to mild on forced expiration. No crackles      Assessment & Plan:  Viral syndrome with secondary asthma treat as outlined

## 2013-06-17 ENCOUNTER — Telehealth: Payer: Self-pay | Admitting: Family Medicine

## 2013-06-17 NOTE — Telephone Encounter (Signed)
Patient Information:  Caller Name: Zaron  Phone: [phone]  Patient: Luis Guzman, Luis Guzman  Gender: Male  DOB: [birth year]  Age: 67 Years  PCP: Evelena Peat [facility])  Office Follow Up:  Does the office need to follow up with this patient?: Yes  Instructions For The Office: Patient declines appt. Patient is requesting that a Rx be called into [facility] on [address] at [phone]. Patient states he is leaving to go out of town at 1200 06/17/13 but will go [facility] a local [facility] when he reaches his destination and have Rx transferred. Patient can be reached at [phone].  RN Note:  Patient states he was seen in the office 06/10/13 for cough. chest congestion. States he was prescribed Hycodan and Prednisone. States cough has "significantly" improved. States he has had head and nasal congestion X 2 weeks. States head and nasal congestion have increased, onset 06/13/13. Afebrile. States thick, yellowish/green blood tinged nasal drainage noted. Patient states he is experiencing sinus pain/pressure on left side above eye. Denies wheezing or difficulty breathing. Care advice given per guidelines. Call back parameters reviewed. Patient verbalizes understanding. Patient declines appt. Patient is requesting that a Rx be called into [facility] on [address] at [phone]. Patient states he is leaving to go out of town at 1200 06/17/13 but will go [facility] a local [facility] when he reaches his destination and have Rx transferred. Patient can be reached at [phone].  Symptoms  Reason For Call & Symptoms: Head and nasal congestion  Reviewed Health History In EMR: Yes  Reviewed Medications In EMR: Yes  Reviewed Allergies In EMR: Yes  Reviewed Surgeries / Procedures: Yes  Date of Onset of Symptoms: 06/13/2013  Treatments Tried: Prednisone  Treatments Tried Worked: No  Guideline(s) Used:  Sinus Pain and Congestion  Disposition Per Guideline:   See Today or Tomorrow in Office  Reason For  Disposition Reached:   Sinus congestion (pressure, fullness) present > 10 days  Advice Given:  For a Stuffy Nose - Use Nasal Washes:  Introduction: Saline (salt water) nasal irrigation (nasal wash) is an effective and simple home remedy for treating stuffy nose and sinus congestion. The nose can be irrigated by pouring, spraying, or squirting salt water into the nose and then letting it run back out.  How it Helps: The salt water rinses out excess mucus, washes out any irritants (dust, allergens) that might be present, and moistens the nasal cavity.  Patient Refused Recommendation:  Patient Requests Prescription  Patient declines appt. Patient is requesting that a Rx be called into [facility] on [address] at [phone]. Patient states he is leaving to go out of town at 1200 06/17/13 but will [facility] a local [facility] when he reaches his destination and have Rx transferred. Patient can be reached at [phone].

## 2013-06-17 NOTE — Telephone Encounter (Signed)
Go ahead and call in Levaquin 500 mg one po daily for 7 days.

## 2013-06-18 MED ORDER — LEVOFLOXACIN 500 MG PO TABS: 500.0000 mg | ORAL_TABLET | Freq: Every day | ORAL | Status: DC

## 2013-06-18 NOTE — Telephone Encounter (Signed)
RX sent to pharmacy. Left message on patient Vm informing him the [facility]

## 2013-06-20 ENCOUNTER — Other Ambulatory Visit: Payer: Self-pay

## 2013-07-12 ENCOUNTER — Encounter: Payer: Self-pay | Admitting: Family Medicine

## 2013-07-12 ENCOUNTER — Ambulatory Visit (INDEPENDENT_AMBULATORY_CARE_PROVIDER_SITE_OTHER): Payer: Medicare Other | Admitting: Family Medicine

## 2013-07-12 VITALS — BP 120/70 | HR 69 | Temp 98.2°F | Wt 197.0 lb

## 2013-07-12 DIAGNOSIS — R252 Cramp and spasm: Secondary | ICD-10-CM

## 2013-07-12 LAB — BASIC METABOLIC PANEL
BUN: 16 mg/dL (ref 6–23)
CO2: 26 mEq/L (ref 19–32)
Calcium: 9.1 mg/dL (ref 8.4–10.5)
Chloride: 104 mEq/L (ref 96–112)
Creat: 0.8 mg/dL (ref 0.50–1.35)
Glucose, Bld: 105 mg/dL — ABNORMAL HIGH (ref 70–99)
Potassium: 3.7 mEq/L (ref 3.5–5.3)
Sodium: 140 mEq/L (ref 135–145)

## 2013-07-12 LAB — MAGNESIUM: Magnesium: 1.9 mg/dL (ref 1.5–2.5)

## 2013-07-12 MED ORDER — ROSUVASTATIN CALCIUM 10 MG PO TABS: 10.0000 mg | ORAL_TABLET | Freq: Every day | ORAL | Status: DC

## 2013-07-12 NOTE — Progress Notes (Signed)
Pre visit review using our clinic review tool, if applicable. No additional management support is needed unless otherwise documented below in the visit note. 

## 2013-07-12 NOTE — Progress Notes (Signed)
   Subjective:    Patient ID: Luis Guzman, male    DOB: 1946-06-10, 67 y.o.   MRN: [id number]  HPI Patient seen with frequent leg cramps going on for several weeks or possibly months. These occur more at night. He's not describing any claudication symptoms. Sometimes anterior leg but mostly posterior calves. He does take Crestor but he distinguish these from myalgias. These are episodic. Generally drinks plenty of fluids. No history of CAD. He does not have any history of recent hypokalemia. He does take HCTZ for hypertension. Has never had magnesium levels checked.  Past Medical History  Diagnosis Date  . HYPERLIPIDEMIA 01/26/2009  . HYPERTENSION 01/26/2009  . 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 Appendectomy      age 49  . Spine surgery  1997    cervical laminectomy  . Rotator cuff repair      left 2004, right 2009  . Orif fibula fracture      compartment syndrome    reports that he quit smoking about 44 years ago. His smoking use included Cigarettes. He has a 5 pack-year smoking history. He does not have any smokeless tobacco history on file. He reports that he does not drink alcohol. His drug history is not on file. family history includes Arthritis in his sister; Hypertension in his father and mother; Stroke (age of onset: 34) in his sister. Allergies  Allergen Reactions  . Penicillins     REACTION: itiching      Review of Systems  Constitutional: Negative for fatigue.  Eyes: Negative for visual disturbance.  Respiratory: Negative for cough, chest tightness and shortness of breath.   Cardiovascular: Negative for chest pain, palpitations and leg swelling.  Neurological: Negative for dizziness, syncope, weakness, light-headedness and headaches.       Objective:   Physical Exam  Constitutional: He is oriented to person, place, and time. He appears well-developed and well-nourished.  HENT:  Right  Ear: External ear normal.  Left Ear: External ear normal.  Mouth/Throat: Oropharynx is clear and moist.  Eyes: Pupils are equal, round, and reactive to light.  Neck: Neck supple. No thyromegaly present.  Cardiovascular: Normal rate and regular rhythm.   Pulmonary/Chest: Effort normal and breath sounds normal. No respiratory distress. He has no wheezes. He has no rales.  Musculoskeletal: He exhibits no edema.  Good distal foot pulses bilaterally  Neurological: He is alert and oriented to person, place, and time.          Assessment & Plan:  Bilateral leg cramps. Check basic metabolic panel and magnesium level. Emphasized adequate hydration. Emphasize stretching. Consider tonic water. Consider multivitamin B complex

## 2013-07-12 NOTE — Patient Instructions (Addendum)
  Leg Cramps Leg cramps that occur during exercise can be caused by poor circulation or dehydration. However, muscle cramps that occur at rest or during the night are usually not due to any serious medical problem. Heat cramps may cause muscle spasms during hot weather.  CAUSES There is no clear cause for muscle cramps. However, dehydration may be a factor for those who do not drink enough fluids and those who exercise in the heat. Imbalances in the level of sodium, potassium, calcium or magnesium in the muscle tissue may also be a factor. Some medications, such as water pills (diuretics), may cause loss of chemicals that the body needs (like sodium and potassium) and cause muscle cramps. TREATMENT   Make sure your diet has enough fluids and essential minerals for the muscle to work normally.  Avoid strenuous exercise for several days if you have been having frequent leg cramps.  Stretch and massage the cramped muscle for several minutes.  Some medicines may be helpful in some patients with night cramps. Only take over-the-counter or prescription medicines as directed by your caregiver. SEEK IMMEDIATE MEDICAL CARE IF:   Your leg cramps become worse.  Your foot becomes cold, numb, or blue. Document Released: 09/08/2004 Document Revised: 10/24/2011 Document Reviewed: 08/26/2008 [facility] Patient Information 2014 [place], [place].  If labs all normal, consider over the counter multivitamin (B complex)

## 2013-07-15 ENCOUNTER — Encounter: Payer: Self-pay | Admitting: Family Medicine

## 2013-08-02 ENCOUNTER — Ambulatory Visit (INDEPENDENT_AMBULATORY_CARE_PROVIDER_SITE_OTHER): Payer: Medicare Other

## 2013-08-02 DIAGNOSIS — Z23 Encounter for immunization: Secondary | ICD-10-CM

## 2013-09-09 DIAGNOSIS — M5412 Radiculopathy, cervical region: Secondary | ICD-10-CM | POA: Diagnosis not present

## 2013-09-09 DIAGNOSIS — M4000 Postural kyphosis, site unspecified: Secondary | ICD-10-CM | POA: Diagnosis not present

## 2013-09-09 DIAGNOSIS — M542 Cervicalgia: Secondary | ICD-10-CM | POA: Diagnosis not present

## 2013-09-16 DIAGNOSIS — R198 Other specified symptoms and signs involving the digestive system and abdomen: Secondary | ICD-10-CM | POA: Diagnosis not present

## 2013-09-16 DIAGNOSIS — R1013 Epigastric pain: Secondary | ICD-10-CM | POA: Diagnosis not present

## 2013-09-16 DIAGNOSIS — M1710 Unilateral primary osteoarthritis, unspecified knee: Secondary | ICD-10-CM | POA: Diagnosis not present

## 2013-09-27 ENCOUNTER — Ambulatory Visit (INDEPENDENT_AMBULATORY_CARE_PROVIDER_SITE_OTHER): Payer: Medicare Other | Admitting: Family Medicine

## 2013-09-27 DIAGNOSIS — Z23 Encounter for immunization: Secondary | ICD-10-CM

## 2013-10-07 DIAGNOSIS — M542 Cervicalgia: Secondary | ICD-10-CM | POA: Diagnosis not present

## 2013-10-07 DIAGNOSIS — M5412 Radiculopathy, cervical region: Secondary | ICD-10-CM | POA: Diagnosis not present

## 2013-10-07 DIAGNOSIS — Q7649 Other congenital malformations of spine, not associated with scoliosis: Secondary | ICD-10-CM | POA: Diagnosis not present

## 2013-11-21 ENCOUNTER — Other Ambulatory Visit: Payer: Self-pay

## 2014-01-27 DIAGNOSIS — M1710 Unilateral primary osteoarthritis, unspecified knee: Secondary | ICD-10-CM | POA: Diagnosis not present

## 2014-02-04 DIAGNOSIS — M542 Cervicalgia: Secondary | ICD-10-CM | POA: Diagnosis not present

## 2014-02-04 DIAGNOSIS — M5412 Radiculopathy, cervical region: Secondary | ICD-10-CM | POA: Diagnosis not present

## 2014-03-04 DIAGNOSIS — M542 Cervicalgia: Secondary | ICD-10-CM | POA: Diagnosis not present

## 2014-03-04 DIAGNOSIS — Q7649 Other congenital malformations of spine, not associated with scoliosis: Secondary | ICD-10-CM | POA: Diagnosis not present

## 2014-03-04 DIAGNOSIS — M5412 Radiculopathy, cervical region: Secondary | ICD-10-CM | POA: Diagnosis not present

## 2014-03-10 ENCOUNTER — Other Ambulatory Visit: Payer: Self-pay | Admitting: Gastroenterology

## 2014-03-10 DIAGNOSIS — R1013 Epigastric pain: Secondary | ICD-10-CM

## 2014-03-10 DIAGNOSIS — R110 Nausea: Secondary | ICD-10-CM | POA: Diagnosis not present

## 2014-03-12 ENCOUNTER — Inpatient Hospital Stay (HOSPITAL_COMMUNITY): Admission: AD | Admit: 2014-03-12 | Payer: Medicare Other | Source: Ambulatory Visit | Admitting: Family Medicine

## 2014-03-12 ENCOUNTER — Inpatient Hospital Stay (HOSPITAL_COMMUNITY): Payer: Medicare Other

## 2014-03-12 ENCOUNTER — Inpatient Hospital Stay (HOSPITAL_COMMUNITY)
Admission: AD | Admit: 2014-03-12 | Discharge: 2014-03-14 | DRG: 440 | Disposition: A | Discharge status: Home / Self Care | Payer: Medicare Other | Source: Ambulatory Visit | Attending: Internal Medicine | Admitting: Internal Medicine

## 2014-03-12 ENCOUNTER — Encounter (HOSPITAL_COMMUNITY): Payer: Self-pay | Admitting: General Practice

## 2014-03-12 DIAGNOSIS — K2970 Gastritis, unspecified, without bleeding: Secondary | ICD-10-CM | POA: Diagnosis not present

## 2014-03-12 DIAGNOSIS — M1990 Unspecified osteoarthritis, unspecified site: Secondary | ICD-10-CM | POA: Diagnosis present

## 2014-03-12 DIAGNOSIS — R109 Unspecified abdominal pain: Secondary | ICD-10-CM | POA: Diagnosis not present

## 2014-03-12 DIAGNOSIS — K219 Gastro-esophageal reflux disease without esophagitis: Secondary | ICD-10-CM | POA: Diagnosis present

## 2014-03-12 DIAGNOSIS — N281 Cyst of kidney, acquired: Secondary | ICD-10-CM | POA: Diagnosis not present

## 2014-03-12 DIAGNOSIS — I10 Essential (primary) hypertension: Secondary | ICD-10-CM | POA: Diagnosis present

## 2014-03-12 DIAGNOSIS — J019 Acute sinusitis, unspecified: Secondary | ICD-10-CM

## 2014-03-12 DIAGNOSIS — K859 Acute pancreatitis without necrosis or infection, unspecified: Principal | ICD-10-CM | POA: Diagnosis present

## 2014-03-12 DIAGNOSIS — E785 Hyperlipidemia, unspecified: Secondary | ICD-10-CM | POA: Diagnosis present

## 2014-03-12 DIAGNOSIS — E119 Type 2 diabetes mellitus without complications: Secondary | ICD-10-CM | POA: Diagnosis present

## 2014-03-12 DIAGNOSIS — Z87891 Personal history of nicotine dependence: Secondary | ICD-10-CM

## 2014-03-12 DIAGNOSIS — M159 Polyosteoarthritis, unspecified: Secondary | ICD-10-CM

## 2014-03-12 DIAGNOSIS — R739 Hyperglycemia, unspecified: Secondary | ICD-10-CM | POA: Diagnosis present

## 2014-03-12 DIAGNOSIS — K858 Other acute pancreatitis without necrosis or infection: Secondary | ICD-10-CM

## 2014-03-12 LAB — HEMOGLOBIN A1C
Hgb A1c MFr Bld: 6 % — ABNORMAL HIGH (ref <5.7)
Mean Plasma Glucose: 126 mg/dL — ABNORMAL HIGH (ref <117)

## 2014-03-12 LAB — COMPREHENSIVE METABOLIC PANEL
ALT: 13 U/L (ref 0–53)
AST: 19 U/L (ref 0–37)
Albumin: 4 g/dL (ref 3.5–5.2)
Alkaline Phosphatase: 90 U/L (ref 39–117)
Anion gap: 14 (ref 5–15)
BUN: 10 mg/dL (ref 6–23)
CO2: 26 mEq/L (ref 19–32)
Calcium: 9.1 mg/dL (ref 8.4–10.5)
Chloride: 102 mEq/L (ref 96–112)
Creatinine, Ser: 0.96 mg/dL (ref 0.50–1.35)
GFR calc Af Amer: >90 mL/min (ref >90)
GFR calc non Af Amer: 84 mL/min — ABNORMAL LOW (ref >90)
Glucose, Bld: 89 mg/dL (ref 70–99)
Potassium: 3.9 mEq/L (ref 3.7–5.3)
Sodium: 142 mEq/L (ref 137–147)
Total Bilirubin: 0.2 mg/dL — ABNORMAL LOW (ref 0.3–1.2)
Total Protein: 6.8 g/dL (ref 6.0–8.3)

## 2014-03-12 LAB — LIPID PANEL
Cholesterol: 262 mg/dL — ABNORMAL HIGH (ref 0–200)
HDL: 36 mg/dL — ABNORMAL LOW (ref >39)
LDL Cholesterol: 176 mg/dL — ABNORMAL HIGH (ref 0–99)
Total CHOL/HDL Ratio: 7.3 RATIO
Triglycerides: 251 mg/dL — ABNORMAL HIGH (ref <150)
VLDL: 50 mg/dL — ABNORMAL HIGH (ref 0–40)

## 2014-03-12 LAB — PROTIME-INR
INR: 1.02 (ref 0.00–1.49)
Prothrombin Time: 13.4 seconds (ref 11.6–15.2)

## 2014-03-12 LAB — URINALYSIS, ROUTINE W REFLEX MICROSCOPIC
Bilirubin Urine: NEGATIVE
Glucose, UA: NEGATIVE mg/dL
Hgb urine dipstick: NEGATIVE
Ketones, ur: NEGATIVE mg/dL
Leukocytes, UA: NEGATIVE
Nitrite: NEGATIVE
Protein, ur: NEGATIVE mg/dL
Specific Gravity, Urine: 1.012 (ref 1.005–1.030)
Urobilinogen, UA: 1 mg/dL (ref 0.0–1.0)
pH: 7.5 (ref 5.0–8.0)

## 2014-03-12 LAB — CBC
HCT: 47 % (ref 39.0–52.0)
Hemoglobin: 15.9 g/dL (ref 13.0–17.0)
MCH: 30.9 pg (ref 26.0–34.0)
MCHC: 33.8 g/dL (ref 30.0–36.0)
MCV: 91.4 fL (ref 78.0–100.0)
Platelets: 208 10*3/uL (ref 150–400)
RBC: 5.14 MIL/uL (ref 4.22–5.81)
RDW: 12.7 % (ref 11.5–15.5)
WBC: 9.4 10*3/uL (ref 4.0–10.5)

## 2014-03-12 LAB — TROPONIN I: Troponin I: <0.3 ng/mL (ref <0.30)

## 2014-03-12 LAB — SEDIMENTATION RATE: Sed Rate: 4 mm/hr (ref 0–16)

## 2014-03-12 LAB — TSH: TSH: 0.915 u[IU]/mL (ref 0.350–4.500)

## 2014-03-12 LAB — LIPASE, BLOOD: Lipase: 33 U/L (ref 11–59)

## 2014-03-12 MED ORDER — IRBESARTAN 150 MG PO TABS
150.0000 mg | ORAL_TABLET | Freq: Every day | ORAL | Status: DC
  Administered 2014-03-13: 150 mg via ORAL
  Filled 2014-03-12 (×2): qty 1

## 2014-03-12 MED ORDER — ENOXAPARIN SODIUM 40 MG/0.4ML ~~LOC~~ SOLN
40.0000 mg | SUBCUTANEOUS | Status: DC
  Filled 2014-03-12 (×3): qty 0.4

## 2014-03-12 MED ORDER — SODIUM CHLORIDE 0.9 % IV SOLN
INTRAVENOUS | Status: DC
  Administered 2014-03-12: 75 mL/h via INTRAVENOUS
  Administered 2014-03-13 (×2): via INTRAVENOUS

## 2014-03-12 MED ORDER — ACETAMINOPHEN 325 MG PO TABS: 650.0000 mg | ORAL_TABLET | Freq: Four times a day (QID) | ORAL | Status: DC | PRN

## 2014-03-12 MED ORDER — ALUM & MAG HYDROXIDE-SIMETH 200-200-20 MG/5ML PO SUSP
30.0000 mL | Freq: Four times a day (QID) | ORAL | Status: DC | PRN
Start: 2014-03-12 — End: 2014-03-14

## 2014-03-12 MED ORDER — TOPIRAMATE 25 MG PO TABS
25.0000 mg | ORAL_TABLET | Freq: Every day | ORAL | Status: DC
  Administered 2014-03-12: 25 mg via ORAL
  Filled 2014-03-12 (×2): qty 1

## 2014-03-12 MED ORDER — IOHEXOL 300 MG/ML  SOLN
100.0000 mL | Freq: Once | INTRAMUSCULAR | Status: AC | PRN
  Administered 2014-03-12: 100 mL via INTRAVENOUS

## 2014-03-12 MED ORDER — ONDANSETRON HCL 4 MG/2ML IJ SOLN: 4.0000 mg | Freq: Four times a day (QID) | INTRAMUSCULAR | Status: DC | PRN

## 2014-03-12 MED ORDER — OXYCODONE HCL 5 MG PO TABS
5.0000 mg | ORAL_TABLET | ORAL | Status: DC | PRN
  Administered 2014-03-12 – 2014-03-13 (×2): 5 mg via ORAL
  Filled 2014-03-12 (×2): qty 1

## 2014-03-12 MED ORDER — TOPIRAMATE 25 MG PO TABS
25.0000 mg | ORAL_TABLET | Freq: Every day | ORAL | Status: DC
  Filled 2014-03-12: qty 1

## 2014-03-12 MED ORDER — ONDANSETRON HCL 4 MG PO TABS: 4.0000 mg | ORAL_TABLET | Freq: Four times a day (QID) | ORAL | Status: DC | PRN

## 2014-03-12 MED ORDER — ACETAMINOPHEN 650 MG RE SUPP: 650.0000 mg | Freq: Four times a day (QID) | RECTAL | Status: DC | PRN

## 2014-03-12 MED ORDER — PANTOPRAZOLE SODIUM 40 MG PO TBEC
40.0000 mg | DELAYED_RELEASE_TABLET | Freq: Every day | ORAL | Status: DC
  Administered 2014-03-12 – 2014-03-13 (×2): 40 mg via ORAL
  Filled 2014-03-12 (×2): qty 1

## 2014-03-12 MED ORDER — MORPHINE SULFATE 2 MG/ML IJ SOLN: 2.0000 mg | INTRAMUSCULAR | Status: DC | PRN

## 2014-03-12 MED ORDER — INSULIN ASPART 100 UNIT/ML ~~LOC~~ SOLN: 0.0000 [IU] | SUBCUTANEOUS | Status: DC

## 2014-03-12 NOTE — Consult Note (Signed)
Patient seen interviewed and examined. I agree with Dr Marjory Sneddon note. He was going to have an EGD on Monday with Dr Cristina Gong. We will do it here tomorrow. Check on CT scan.

## 2014-03-12 NOTE — H&P (Signed)
[facility] History and Physical  Luis Guzman XMD:[id number] DOB: Nov 11, 1945 DOA: 03/12/2014  Referring physician:  PCP: Eulas Post, MD   Chief Complaint: Abdominal Pain  HPI: Luis Guzman is a 68 y.o. male with a past medical history of hypertension, dyslipidemia, osteoarthritis, presenting as a direct admission to the medicine service. He reports having abdominal pain over the past month located in the epigastric region characterized as constant, having a peak intensity of 9/10. Over the past week his abdominal pain has been more noticeable over the periumbilical region extending across his abdomen. He reports associated nausea, anorexia, poor oral intake, bloating, overall feeling ill. He denies fevers, chills, hematemesis, bloody stools, black tarry stools, chest pain, shortness of breath, dysuria, hematuria. He had workup done by his gastroenterologist, Dr Cristina Gong, that showed an elevated lipase of 297. He denies alcohol use or recent changes in medications. I spoke with Dr Cristina Gong who recommended obtaining a CT scan of abdomen and pelvis as well as a right upper quadrant ultrasound. on 02/21/2012 he had an abnormal HIDA scan, study repeated on  02/18/2013 which was interpreted by radiology as a normal study.                                                                                                                                                                                                                                                                                    Review of Systems:  Constitutional:  No weight loss, night sweats, Fevers, chills, fatigue.  HEENT:  No headaches, Difficulty swallowing,Tooth/dental problems,Sore throat,  No sneezing, itching, ear ache, nasal congestion, post nasal drip,  Cardio-vascular:  No chest pain, Orthopnea, PND, swelling in lower extremities, anasarca, dizziness, palpitations  GI:  No heartburn, indigestion, positive for  abdominal pain, nausea, no vomiting, diarrhea, change in bowel habits, loss of appetite  Resp:  No shortness of breath with exertion or at rest. No excess mucus, no productive cough, No non-productive cough, No coughing up of blood.No change in color of mucus.No wheezing.No chest wall deformity  Skin:  no rash or lesions.  GU:  no dysuria, change in color of urine, no urgency or frequency. No  flank pain.  Musculoskeletal:  No joint pain or swelling. No decreased range of motion. No back pain.  Psych:  No change in mood or affect. No depression or anxiety. No memory loss.   Past Medical History  Diagnosis Date  . HYPERLIPIDEMIA 01/26/2009  . HYPERTENSION 01/26/2009  . 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 Appendectomy      age 47  . Spine surgery  1997    cervical laminectomy  . Rotator cuff repair      left 2004, right 2009  . Orif fibula fracture      compartment syndrome   Social History:  reports that he quit smoking about 45 years ago. His smoking use included Cigarettes. He has a 5 pack-year smoking history. He does not have any smokeless tobacco history on file. He reports that he does not drink alcohol. His drug history is not on file.  Allergies  Allergen Reactions  . Penicillins Itching    Family History  Problem Relation Age of Onset  . Hypertension Mother   . Hypertension Father   . Arthritis Sister     rheumatoid  . Stroke Sister 3     Prior to Admission medications   Medication Sig Start Date End Date Taking? Authorizing Provider  Aspirin-Salicylamide-Caffeine (BC HEADACHE POWDER PO) Take 1 packet by mouth 2 (two) times daily as needed (headache).   Yes Historical Provider, MD  carisoprodol (SOMA) 350 MG tablet Take 350 mg by mouth daily as needed for muscle spasms.   Yes Historical Provider, MD  dexlansoprazole (DEXILANT) 60 MG capsule Take 60 mg by mouth daily.   Yes Historical  Provider, MD  hydrochlorothiazide (HYDRODIURIL) 12.5 MG tablet Take 12.5 mg by mouth daily.   Yes Historical Provider, MD  nebivolol (BYSTOLIC) 5 MG tablet Take 5 mg by mouth daily after supper.   Yes Historical Provider, MD  olmesartan (BENICAR) 40 MG tablet Take 40 mg by mouth daily after supper.   Yes Historical Provider, MD  oxyCODONE-acetaminophen (PERCOCET/ROXICET) 5-325 MG per tablet Take 1 tablet by mouth once.    Yes Historical Provider, MD  topiramate (TOPAMAX) 25 MG tablet Take 25 mg by mouth daily after supper.  03/04/14  Yes Historical Provider, MD   Physical Exam: Filed Vitals:   03/12/14 1229 03/12/14 1314  BP: 116/69 127/71  Pulse: 58 52  Temp: 98.7 F (37.1 C) 98.4 F (36.9 C)  TempSrc: Oral Oral  Resp: 16 16  Height: 6\' 1"  (1.854 m)   Weight: 88.905 kg (196 lb)   SpO2: 95% 97%    Wt Readings from Last 3 Encounters:  03/12/14 88.905 kg (196 lb)  07/12/13 89.359 kg (197 lb)  06/10/13 86.183 kg (190 lb)    General:  Appears calm and comfortable, in no acute distress. Eyes: PERRL, normal lids, irises & conjunctiva ENT: grossly normal hearing, lips & tongue Neck: no LAD, masses or thyromegaly Cardiovascular: RRR, no m/r/g. No LE edema. Telemetry: SR, no arrhythmias  Respiratory: CTA bilaterally, no w/r/r. Normal respiratory effort. Abdomen: soft, having mild to moderate pain to palpation to right upper quadrant Skin: no rash or induration seen on limited exam Musculoskeletal: grossly normal tone BUE/BLE Psychiatric: grossly normal mood and affect, speech fluent and appropriate Neurologic: grossly non-focal.          Labs on Admission:  Basic Metabolic Panel: No results found for this basename: NA, K, CL, CO2, GLUCOSE, BUN, CREATININE, CALCIUM, MG, PHOS,  in the last 168 hours Liver Function Tests: No results found for this basename: AST, ALT, ALKPHOS, BILITOT, PROT, ALBUMIN,  in the last 168 hours No results found for this basename: LIPASE, AMYLASE,  in the  last 168 hours No results found for this basename: AMMONIA,  in the last 168 hours CBC: No results found for this basename: WBC, NEUTROABS, HGB, HCT, MCV, PLT,  in the last 168 hours Cardiac Enzymes: No results found for this basename: CKTOTAL, CKMB, CKMBINDEX, TROPONINI,  in the last 168 hours  BNP (last 3 results) No results found for this basename: PROBNP,  in the last 8760 hours CBG: No results found for this basename: GLUCAP,  in the last 168 hours  Radiological Exams on Admission: No results found.  EKG: Independently reviewed.   Assessment/Plan Principal Problem:   Pancreatitis, acute Active Problems:   HYPERLIPIDEMIA   HYPERTENSION   Type 2 diabetes mellitus   Pancreatitis   1. Acute pancreatitis. Patient complaining of abdominal pain over the past month, lab work and his gastroenterologist office showing a lipase level of 297. He denies history of alcohol abuse. Will check a CT scan of abdomen and pelvis and right u[er quadrant ultrasound as recommended by gastroenterology. Keep patient n.p.o., provide IV fluids as well as narcotic analgesics for severe pain. Will check triglyceride level, repeat labs in am. Provide supportive care, admit to [place]. 2. History of hypertriglyceridemia. Possibly could be contributing to pancreatitis, although had triglyceride level of 179 on 04/11/2013. Will obtain triglyceride level during this hospitalization.  3. Type 2 diabetes mellitus. Will check hemoglobin A1c. As patient is n.p.o. will provide Accu-Cheks every 4 hours with sliding scale coverage 4. Hypertension. Given presence bradycardia with heart rates in the 94M will hold Bystolic for now. Will also hold hydrochlorothiazide as I suspect he may be dehydrated from poor oral intake over the past week.  5. DVT prophylaxis. Lovenox   Code Status: Full Code Family Communication:  Disposition Plan: Admit to med/surg, anticipate will require greater than 2 nights  hospitalization  Time spent: 70 min   Kelvin Cellar [facility] Pager [phone]  **Disclaimer: This note may have been dictated with voice recognition software. Similar sounding words can inadvertently be transcribed and this note may contain transcription errors which may not have been corrected upon publication of note.**

## 2014-03-12 NOTE — Consult Note (Signed)
Date: 03/12/2014               Patient Name:  Luis Guzman MRN: [id number]  DOB: 10/24/1945 Age / Sex: 68 y.o., male   PCP: Eulas Post, MD         Requesting Physician: Dr. Kelvin Cellar, MD    Consulting Reason:  Pancreatitis     Chief Complaint: Abdominal pain  History of Present Illness: Luis Guzman is a 68 year old man with history of hyperlipidemia presenting with abdominal pain x 6 days. His pain is nonradiating, epigastric/lower mid chest. It has been intermittent. He has also been having mid lower cramping abdominal pain that is constant with associated bloating. It is worse with PO intake. He has been having diarrhea and nausea but no emesis. He was seen by his gastroenterologist Dr. Cristina Gong on 03/10/2014. His lipase was elevated to 297. The episodes of epigastric/low mid chest pain has been going on for the past 2 years with last previous episode 3 weeks ago lasting 1 day. He has attempted therapy with ursodiol which has decreased the intensity of the episodes but did not decrease the frequency. He denies fever or chills, steatorrhea, jaundice, scleral icterus, pruritis, or dark urine.  Last EGD 02/23/2012 normal throughout. HIDA scan performed 02/21/2012 with patent CBD and cystic duct, no cholecystitis. Gallbladder contracted 1.1% during CCK infusion. HIDA scan performed 02/18/2013 normal.  Meds: Current Facility-Administered Medications  Medication Dose Route Frequency Provider Last Rate Last Dose  . 0.9 %  sodium chloride infusion   Intravenous Continuous Kelvin Cellar, MD      . acetaminophen (TYLENOL) tablet 650 mg  650 mg Oral Q6H PRN Kelvin Cellar, MD       Or  . acetaminophen (TYLENOL) suppository 650 mg  650 mg Rectal Q6H PRN Kelvin Cellar, MD      . alum & mag hydroxide-simeth (MAALOX/MYLANTA) 200-200-20 MG/5ML suspension 30 mL  30 mL Oral Q6H PRN Kelvin Cellar, MD      . enoxaparin (LOVENOX) injection 40 mg  40 mg Subcutaneous Q24H Kelvin Cellar, MD      .  insulin aspart (novoLOG) injection 0-15 Units  0-15 Units Subcutaneous 6 times per day Kelvin Cellar, MD      . Derrill Memo ON 03/13/2014] irbesartan (AVAPRO) tablet 150 mg  150 mg Oral Daily Kelvin Cellar, MD      . morphine 2 MG/ML injection 2 mg  2 mg Intravenous Q4H PRN Kelvin Cellar, MD      . ondansetron (ZOFRAN) tablet 4 mg  4 mg Oral Q6H PRN Kelvin Cellar, MD       Or  . ondansetron (ZOFRAN) injection 4 mg  4 mg Intravenous Q6H PRN Kelvin Cellar, MD      . oxyCODONE (Oxy IR/ROXICODONE) immediate release tablet 5 mg  5 mg Oral Q4H PRN Kelvin Cellar, MD      . pantoprazole (PROTONIX) EC tablet 40 mg  40 mg Oral Daily Kelvin Cellar, MD      . topiramate (TOPAMAX) tablet 25 mg  25 mg Oral QPC supper Kelvin Cellar, MD      . topiramate (TOPAMAX) tablet 25 mg  25 mg Oral Daily Kelvin Cellar, MD        Allergies: Allergies as of 03/12/2014 - Review Complete 03/12/2014  Allergen Reaction Noted  . Penicillins Itching 01/26/2009    Past Medical History  Diagnosis Date  . HYPERLIPIDEMIA 01/26/2009  . HYPERTENSION 01/26/2009  . 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 Acute pancreatitis  03/12/2014   Past Surgical History  Procedure Laterality Date  . Appendectomy      age 39  . Spine surgery  1997    cervical laminectomy  . Rotator cuff repair      left 2004, right 2009  . Orif fibula fracture      compartment syndrome   Family History  Problem Relation Age of Onset  . Hypertension Mother   . Hypertension Father   . Arthritis Sister     rheumatoid  . Stroke Sister 17   History   Social History  . Marital Status: Married    Spouse Name: N/A    Number of Children: N/A  . Years of Education: N/A   Occupational History  . Not on file.   Social History Main Topics  . Smoking status: Former Smoker -- 1.00 packs/day for 5 years    Types: Cigarettes    Quit date: 10/17/1968  . Smokeless tobacco: Not on file  . Alcohol Use: No  . Drug Use: Not  on file  . Sexual Activity: Not on file   Other Topics Concern  . Not on file   Social History Narrative  . No narrative on file    Review of Systems: Constitutional: no fevers/chills Eyes: no vision changes, no scleral icterus Ears, nose, mouth, throat, and face: no cough Respiratory: no shortness of breath Cardiovascular: no chest pain Gastrointestinal: +nausea, no vomiting, +abdominal pain, no constipation, +diarrhea, no steatorrhea Genitourinary: no dysuria, no hematuria, no dark urine Integument: no rash, no jaundice Hematologic/lymphatic: no bleeding/bruising, no edema Musculoskeletal: no arthralgias, no myalgias Neurological: no paresthesias, no weakness   Physical Exam: Blood pressure 127/71, pulse 52, temperature 98.4 F (36.9 C), temperature source Oral, resp. rate 16, height 6\' 1"  (1.854 m), weight 196 lb (88.905 kg), SpO2 97.00%. General: NAD HEENT: [place]/AT, mmm, no scleral icterus Neck: supple, trachea midline Chest: CTAB, no wheezes Heart: RRR, no m/r/g Abdomen: soft, tender to palpation in epigastric and mid abdomen R>L Skin: no jaundice or rash Extremities: no edema, warm and well perfused Psych: normal mood and affect Neuro: no gross deficits   Lab results: Basic Metabolic Panel:  Recent Labs  03/12/14 1428  NA 142  K 3.9  CL 102  CO2 26  GLUCOSE 89  BUN 10  CREATININE 0.96  CALCIUM 9.1   Liver Function Tests:  Recent Labs  03/12/14 1428  AST 19  ALT 13  ALKPHOS 90  BILITOT 0.2*  PROT 6.8  ALBUMIN 4.0    Recent Labs  03/12/14 1428  LIPASE 33   CBC:  Recent Labs  03/12/14 1428  WBC 9.4  HGB 15.9  HCT 47.0  MCV 91.4  PLT 208   Cardiac Enzymes:  Recent Labs  03/12/14 1428  TROPONINI <0.30   Fasting Lipid Panel:  Recent Labs  03/12/14 1428  CHOL 262*  HDL 36*  LDLCALC 176*  TRIG 251*  CHOLHDL 7.3   Thyroid Function Tests:  Recent Labs  03/12/14 1428  TSH 0.915    Imaging results:  US Abdomen  Limited Ruq  03/12/2014   CLINICAL DATA:  Abdominal pain  EXAM: US ABDOMEN LIMITED - RIGHT UPPER QUADRANT  COMPARISON:  None.  FINDINGS: Gallbladder:  No gallstones or wall thickening visualized. No sonographic Murphy sign noted.  Common bile duct:  Diameter: 3 mm in diameter within normal limits.  Liver:  No focal lesion identified. Within normal limits in parenchymal echogenicity.  IMPRESSION: Unremarkable right upper quadrant ultrasound.  Electronically Signed   By: Lahoma Crocker M.D.   On: 03/12/2014 15:41    Assessment, Plan, & Recommendations: 68 year old man with history of hyperlipidemia presenting with abdominal pain x 6 days. HIDA scan performed 02/21/2012 with patent CBD and cystic duct, no cholecystitis. Gallbladder contracted 1.1% during CCK infusion. HIDA scan performed 02/18/2013 normal. Lipase 03/10/2014 elevated and today normal. LFTs have been normal. US abdomen unremarkable 03/12/2014. Differential diagnosis include mild pancreatitis, intermittent biliary dysfunction, peptic ulcer disease.  Recommendations: -Will follow up CT scan -EGD tomorrow, NPO after midnight -If all imaging negative, may consider repeat HIDA scan  Patient seen and discussed with attending physician, Dr. Penelope Coop  Signed: Jacques Earthly, MD 03/12/2014, 2:32 PM

## 2014-03-12 NOTE — Progress Notes (Signed)
Luis Guzman [id number] Admission Data: 03/12/2014 3:26 PM Attending Provider: Kelvin Cellar, MD  KCM:[id number],AVWPV W, MD Consults/ Treatment Team:    Luis Guzman is a 68 y.o. male patient admitted,direct admit. Alert  & orientated  X 3,  Full Code, VSS - Blood pressure 127/71, pulse 52, temperature 98.4 F (36.9 C), temperature source Oral, resp. rate 16, height 6\' 1"  (1.854 m), weight 88.905 kg (196 lb), SpO2 97.00%., no c/o shortness of breath, no c/o chest pain, no distress noted.  and pt is currently     Allergies:   Allergies  Allergen Reactions  . Penicillins Itching     Past Medical History  Diagnosis Date  . HYPERLIPIDEMIA 01/26/2009  . HYPERTENSION 01/26/2009  . OSTEOARTHRITIS, GENERALIZED, MULTIPLE JOINTS 01/06/2010     Pt orientation to unit, room and routine. Information packet given to patient/family.  Admission INP armband ID verified with patient/family, and in place. SR up x 2, fall risk assessment complete with Patient and family verbalizing understanding of risks associated with falls. Pt verbalizes an understanding of how to use the call bell and to call for help before getting out of bed.  Skin, clean-dry- intact without evidence of bruising, or skin tears.   No evidence of skin break down noted on exam.     Will cont to monitor and assist as needed.  Dayle Points, RN 03/12/2014 3:26 PM

## 2014-03-13 ENCOUNTER — Other Ambulatory Visit: Payer: Medicare Other

## 2014-03-13 ENCOUNTER — Encounter (HOSPITAL_COMMUNITY): Payer: Self-pay | Admitting: *Deleted

## 2014-03-13 ENCOUNTER — Encounter (HOSPITAL_COMMUNITY)
Admission: AD | Disposition: A | Discharge status: Home / Self Care | Payer: Self-pay | Source: Ambulatory Visit | Attending: Internal Medicine

## 2014-03-13 DIAGNOSIS — J019 Acute sinusitis, unspecified: Secondary | ICD-10-CM

## 2014-03-13 DIAGNOSIS — I10 Essential (primary) hypertension: Secondary | ICD-10-CM

## 2014-03-13 DIAGNOSIS — E119 Type 2 diabetes mellitus without complications: Secondary | ICD-10-CM

## 2014-03-13 DIAGNOSIS — K859 Acute pancreatitis without necrosis or infection, unspecified: Principal | ICD-10-CM

## 2014-03-13 HISTORY — PX: ESOPHAGOGASTRODUODENOSCOPY: SHX5428

## 2014-03-13 LAB — CBC
HCT: 46.5 % (ref 39.0–52.0)
Hemoglobin: 15.6 g/dL (ref 13.0–17.0)
MCH: 31 pg (ref 26.0–34.0)
MCHC: 33.5 g/dL (ref 30.0–36.0)
MCV: 92.4 fL (ref 78.0–100.0)
Platelets: 205 10*3/uL (ref 150–400)
RBC: 5.03 MIL/uL (ref 4.22–5.81)
RDW: 12.8 % (ref 11.5–15.5)
WBC: 8.6 10*3/uL (ref 4.0–10.5)

## 2014-03-13 LAB — BASIC METABOLIC PANEL
Anion gap: 13 (ref 5–15)
BUN: 8 mg/dL (ref 6–23)
CO2: 26 mEq/L (ref 19–32)
Calcium: 9.4 mg/dL (ref 8.4–10.5)
Chloride: 103 mEq/L (ref 96–112)
Creatinine, Ser: 0.97 mg/dL (ref 0.50–1.35)
GFR calc Af Amer: >90 mL/min (ref >90)
GFR calc non Af Amer: 84 mL/min — ABNORMAL LOW (ref >90)
Glucose, Bld: 93 mg/dL (ref 70–99)
Potassium: 4.3 mEq/L (ref 3.7–5.3)
Sodium: 142 mEq/L (ref 137–147)

## 2014-03-13 LAB — TROPONIN I
Troponin I: <0.3 ng/mL (ref <0.30)
Troponin I: <0.3 ng/mL (ref <0.30)

## 2014-03-13 LAB — LIPASE, BLOOD: Lipase: 35 U/L (ref 11–59)

## 2014-03-13 SURGERY — EGD (ESOPHAGOGASTRODUODENOSCOPY)
Anesthesia: Moderate Sedation

## 2014-03-13 MED ORDER — TOPIRAMATE 25 MG PO TABS
25.0000 mg | ORAL_TABLET | Freq: Every day | ORAL | Status: DC
  Administered 2014-03-13: 25 mg via ORAL
  Filled 2014-03-13 (×2): qty 1

## 2014-03-13 MED ORDER — MIDAZOLAM HCL 10 MG/2ML IJ SOLN
INTRAMUSCULAR | Status: DC | PRN
  Administered 2014-03-13: 1 mg via INTRAVENOUS
  Administered 2014-03-13: 2 mg via INTRAVENOUS
  Administered 2014-03-13: 1 mg via INTRAVENOUS
  Administered 2014-03-13: 2 mg via INTRAVENOUS

## 2014-03-13 MED ORDER — SODIUM CHLORIDE 0.9 % IV SOLN
INTRAVENOUS | Status: DC
  Administered 2014-03-13: 500 mL via INTRAVENOUS

## 2014-03-13 MED ORDER — DIPHENHYDRAMINE HCL 50 MG/ML IJ SOLN
INTRAMUSCULAR | Status: AC
  Filled 2014-03-13: qty 1

## 2014-03-13 MED ORDER — BUTAMBEN-TETRACAINE-BENZOCAINE 2-2-14 % EX AERO
INHALATION_SPRAY | CUTANEOUS | Status: DC | PRN
  Administered 2014-03-13: 2 via TOPICAL

## 2014-03-13 MED ORDER — PANTOPRAZOLE SODIUM 40 MG PO TBEC: 40.0000 mg | DELAYED_RELEASE_TABLET | Freq: Every day | ORAL | Status: DC

## 2014-03-13 MED ORDER — NEBIVOLOL HCL 5 MG PO TABS
5.0000 mg | ORAL_TABLET | Freq: Every day | ORAL | Status: DC
  Administered 2014-03-13: 5 mg via ORAL
  Filled 2014-03-13 (×2): qty 1

## 2014-03-13 MED ORDER — MIDAZOLAM HCL 5 MG/ML IJ SOLN
INTRAMUSCULAR | Status: AC
  Filled 2014-03-13: qty 2

## 2014-03-13 MED ORDER — FENTANYL CITRATE 0.05 MG/ML IJ SOLN
INTRAMUSCULAR | Status: DC | PRN
  Administered 2014-03-13 (×2): 25 ug via INTRAVENOUS

## 2014-03-13 MED ORDER — SODIUM CHLORIDE 0.9 % IV SOLN: INTRAVENOUS | Status: DC

## 2014-03-13 MED ORDER — FENTANYL CITRATE 0.05 MG/ML IJ SOLN
INTRAMUSCULAR | Status: AC
  Filled 2014-03-13: qty 2

## 2014-03-13 NOTE — Care Management Note (Signed)
    Page 1 of 1   03/14/2014     10:50:43 AM CARE MANAGEMENT NOTE 03/14/2014  Patient:  PARVIN, STETZER   Account Number:  [id number]  Date Initiated:  03/13/2014  Documentation initiated by:  Tomi Bamberger  Subjective/Objective Assessment:   dx pancreatitis  admit- lives with spouse.     Action/Plan:   Anticipated DC Date:  03/16/2014   Anticipated DC Plan:  [place]  CM consult      Choice offered to / List presented to:             Status of service:  In process, will continue to follow Medicare Important Message given?  YES (If response is "NO", the following Medicare IM given date fields will be blank) Date Medicare IM given:  03/14/2014 Medicare IM given by:  Tomi Bamberger Date Additional Medicare IM given:   Additional Medicare IM given by:    Discharge Disposition:    Per UR Regulation:  Reviewed for med. necessity/level of care/duration of stay  If discussed at [place] of Stay Meetings, dates discussed:    Comments:  03/13/14 [place], BSN [phone] patient lives with spouse, pta indep.  NCM will continue to follow for dc needs

## 2014-03-13 NOTE — Progress Notes (Signed)
Pt refuses to have bed at lowest level.  States "It is easier to get out of bed."  Will continue to monitor pt.

## 2014-03-13 NOTE — Progress Notes (Signed)
Patient ID: Luis Guzman  male  ERX:[id number]    DOB: 1945/11/30    DOA: 03/12/2014  PCP: Eulas Post, MD  Assessment/Plan: Principal Problem:   Pancreatitis, acute; unclear etiology - Lipase 297 in GI office, today 16. He denies any history of alcohol abuse - CT abdomen and pelvis and right upper quadrant ultrasound unremarkable, no cholecystitis/cholelithiasis or obstruction - Patient recommended EGD by gastroenterology, MRCP for further workup  Active Problems:   HYPERLIPIDEMIA - Stable    HYPERTENSION - Continue Avapro, BP stable    Type 2 diabetes mellitus - Patient not on any medications outpatient for diabetes  GERD: - EGD today, placed on PPI   DVT Prophylaxis:  Code Status: Full code  Family Communication: Discussed in detail with patient's wife at the bedside  Disposition:  Consultants:  Gastroenterology  Procedures:  EGD  MRCP  Antibiotics:  None    Subjective: Patient seen and examined, feeling better although does have slight right upper quadrant abdominal pain  Objective: Weight change:   Intake/Output Summary (Last 24 hours) at 03/13/14 1346 Last data filed at 03/13/14 1205  Gross per 24 hour  Intake    300 ml  Output      0 ml  Net    300 ml   Blood pressure 115/71, pulse 56, temperature 97.7 F (36.5 C), temperature source Oral, resp. rate 15, height 6\' 1"  (1.854 m), weight 88.905 kg (196 lb), SpO2 95.00%.  Physical Exam: General: Alert and awake, oriented x3, not in any acute distress. CVS: S1-S2 clear, no murmur rubs or gallops Chest: clear to auscultation bilaterally, no wheezing, rales or rhonchi Abdomen: Right upper quadrant abdominal tenderness to deep palpation, nondistended, normal bowel sounds  Extremities: no cyanosis, clubbing or edema noted bilaterally Neuro: Cranial nerves II-XII intact, no focal neurological deficits  Lab Results: Basic Metabolic Panel:  Recent Labs Lab 03/12/14 1428 03/13/14 0730    NA 142 142  K 3.9 4.3  CL 102 103  CO2 26 26  GLUCOSE 89 93  BUN 10 8  CREATININE 0.96 0.97  CALCIUM 9.1 9.4   Liver Function Tests:  Recent Labs Lab 03/12/14 1428  AST 19  ALT 13  ALKPHOS 90  BILITOT 0.2*  PROT 6.8  ALBUMIN 4.0    Recent Labs Lab 03/12/14 1428 03/13/14 0730  LIPASE 33 35   No results found for this basename: AMMONIA,  in the last 168 hours CBC:  Recent Labs Lab 03/12/14 1428 03/13/14 0730  WBC 9.4 8.6  HGB 15.9 15.6  HCT 47.0 46.5  MCV 91.4 92.4  PLT 208 205   Cardiac Enzymes:  Recent Labs Lab 03/12/14 1428 03/12/14 2320 03/13/14 0730  TROPONINI <0.30 <0.30 <0.30   BNP: No components found with this basename: POCBNP,  CBG: No results found for this basename: GLUCAP,  in the last 168 hours   Micro Results: No results found for this or any previous visit (from the past 240 hour(s)).  Studies/Results: Ct Abdomen Pelvis W Contrast  03/12/2014   CLINICAL DATA:  Abdominal pain  EXAM: CT ABDOMEN AND PELVIS WITH CONTRAST  TECHNIQUE: Multidetector CT imaging of the abdomen and pelvis was performed using the standard protocol following bolus administration of intravenous contrast.  CONTRAST:  195mL OMNIPAQUE IOHEXOL 300 MG/ML  SOLN  COMPARISON:  Ultrasound from earlier in the day, 02/14/2012  FINDINGS: The lung bases are free of acute infiltrate or sizable effusion. Some bullous changes are noted in the right lung base. Small  air-fluid levels are noted within of uncertain significance/ chronicity.  The liver, gallbladder, spleen, adrenal glands and pancreas are all normal in their CT appearance. The kidneys demonstrate a normal enhancement pattern without evidence of obstruction or renal calculi. Small exophytic cyst is noted arising from the lower anterior aspect of the left kidney. Delayed images through the kidneys demonstrate adequate excretion of contrast material. Bladder is well distended.  The appendix is not visualized consistent with  the surgical history. Mild diverticular change of the colon is seen without evidence of diverticulitis. No pelvic mass lesion or sidewall abnormality is noted. The osseous structures show degenerative change of the lumbar spine.  IMPRESSION: No acute intra-abdominal abnormality is noted.  Stable left renal cyst.  Bullous changes with air-fluid levels in the right lower lobe of the lung. These are likely chronic in nature.   Electronically Signed   By: Inez Catalina M.D.   On: 03/12/2014 17:46   US Abdomen Limited Ruq  03/12/2014   CLINICAL DATA:  Abdominal pain  EXAM: US ABDOMEN LIMITED - RIGHT UPPER QUADRANT  COMPARISON:  None.  FINDINGS: Gallbladder:  No gallstones or wall thickening visualized. No sonographic Murphy sign noted.  Common bile duct:  Diameter: 3 mm in diameter within normal limits.  Liver:  No focal lesion identified. Within normal limits in parenchymal echogenicity.  IMPRESSION: Unremarkable right upper quadrant ultrasound.   Electronically Signed   By: Lahoma Crocker M.D.   On: 03/12/2014 15:41    Medications: Scheduled Meds: . enoxaparin (LOVENOX) injection  40 mg Subcutaneous Q24H  . irbesartan  150 mg Oral Daily  . pantoprazole  40 mg Oral Daily  . topiramate  25 mg Oral QHS      LOS: 1 day   Jadarius Commons M.D. [facility] 03/13/2014, 1:46 PM Pager: [phone]  If 7PM-7AM, please contact night-coverage [email or web address] Password [email or web address]  **Disclaimer: This note was dictated with voice recognition software. Similar sounding words can inadvertently be transcribed and this note may contain transcription errors which may not have been corrected upon publication of note.**

## 2014-03-13 NOTE — Op Note (Signed)
[facility] [place], [id number]   ENDOSCOPY PROCEDURE REPORT  PATIENT: Luis, Guzman  MR#: [id number] BIRTHDATE: 1946-06-16 , 12  yrs. old GENDER: Male ENDOSCOPIST: Acquanetta Sit, MD REFERRED BY: PROCEDURE DATE:  03/13/2014 PROCEDURE:  EGD with Biopsy ASA CLASS:     3 INDICATIONS:  abdominal pain MEDICATIONS: Fentayl 41mcg iv, Versed 6mg  iv TOPICAL ANESTHETIC: cetacaine spray  DESCRIPTION OF PROCEDURE: After the risks benefits and alternatives of the procedure were thoroughly explained, informed consent was obtained.  The Pentax Gastroscope [id number] endoscope was introduced through the mouth and advanced to the  2nd portion of duodenum     . Without limitations.  The instrument was slowly withdrawn as the mucosa was fully examined.    Findings:  Esophagus: normal  Stomach: 2 small antral erosions, biopsy done to check for H pylori  Duodenum: some errythema in distal bulb. No ulcer.      The scope was then withdrawn from the patient and the procedure completed.  COMPLICATIONS: There were no complications. ENDOSCOPIC IMPRESSION: see Above  RECOMMENDATIONS:PPI therapy,  REPEAT EXAM:  eSigned:  Acquanetta Sit, MD 03/13/2014 11:57 AM   CC:

## 2014-03-14 ENCOUNTER — Inpatient Hospital Stay (HOSPITAL_COMMUNITY): Payer: Medicare Other

## 2014-03-14 DIAGNOSIS — E785 Hyperlipidemia, unspecified: Secondary | ICD-10-CM

## 2014-03-14 MED ORDER — GADOBENATE DIMEGLUMINE 529 MG/ML IV SOLN
20.0000 mL | Freq: Once | INTRAVENOUS | Status: AC
Start: 2014-03-14 — End: 2014-03-14
  Administered 2014-03-14: 18 mL via INTRAVENOUS

## 2014-03-14 MED ORDER — SIMETHICONE 80 MG PO CHEW: 80.0000 mg | CHEWABLE_TABLET | Freq: Four times a day (QID) | ORAL | Status: DC | PRN

## 2014-03-14 MED ORDER — SIMETHICONE 80 MG PO CHEW
80.0000 mg | CHEWABLE_TABLET | Freq: Four times a day (QID) | ORAL | Status: DC | PRN
  Filled 2014-03-14: qty 1

## 2014-03-14 NOTE — Discharge Summary (Signed)
Physician Discharge Summary  Patient ID: Luis Guzman MRN: [id number] DOB/AGE: 1945-09-26 68 y.o.  Admit date: 03/12/2014 Discharge date: 03/14/2014  Primary Care Physician:  Luis Post, MD  Discharge Diagnoses:    . Pancreatitis, acute from unclear etiology  . GERD  . HYPERLIPIDEMIA . HYPERTENSION . Type 2 diabetes mellitus   Consults:  Gastroenterology, Dr. Penelope Guzman   Recommendations for Outpatient Follow-up:  Patient was recommended to followup with Dr. Cristina Guzman for further work up outpatient  Allergies:   Allergies  Allergen Reactions  . Penicillins Itching     Discharge Medications:   Medication List         BC HEADACHE POWDER PO  Take 1 packet by mouth 2 (two) times daily as needed (headache).     carisoprodol 350 MG tablet  Commonly known as:  SOMA  Take 350 mg by mouth daily as needed for muscle spasms.     dexlansoprazole 60 MG capsule  Commonly known as:  DEXILANT  Take 60 mg by mouth daily.     hydrochlorothiazide 12.5 MG tablet  Commonly known as:  HYDRODIURIL  Take 12.5 mg by mouth daily.     nebivolol 5 MG tablet  Commonly known as:  BYSTOLIC  Take 5 mg by mouth daily after supper.     olmesartan 40 MG tablet  Commonly known as:  BENICAR  Take 40 mg by mouth daily after supper.     oxyCODONE-acetaminophen 5-325 MG per tablet  Commonly known as:  PERCOCET/ROXICET  Take 1 tablet by mouth once.     simethicone 80 MG chewable tablet  Commonly known as:  MYLICON  Chew 1 tablet (80 mg total) by mouth 4 (four) times daily as needed for flatulence (gas and abdominal distension).     topiramate 25 MG tablet  Commonly known as:  TOPAMAX  Take 25 mg by mouth daily after supper.         Brief H and P: For complete details please refer to admission H and P, but in briefJerry W Guzman is a 68 y.o. male with a past medical history of hypertension, dyslipidemia, osteoarthritis, presenting as a direct admission to the medicine service. He reports  having abdominal pain over the past month located in the epigastric region characterized as constant, having a peak intensity of 9/10. Over the past week, his abdominal pain had been more noticeable over the periumbilical region extending across his abdomen. He reported associated nausea, anorexia, poor oral intake, bloating, overall feeling ill. He denied fevers, chills, hematemesis, bloody stools, black tarry stools, chest pain, shortness of breath, dysuria, hematuria. He had workup done by his gastroenterologist, Dr Luis Guzman, that showed an elevated lipase of 297. He denied alcohol use or recent changes in medications. On 02/21/2012 he had an abnormal HIDA scan, study repeated on 02/18/2013 which was interpreted by radiology as a normal study.    Hospital Course:   Pancreatitis, acute; unclear etiology. Patient was admitted for further workup. Lipase was normal during hospitalization. Patient was followed by gastroenterology. CT abdomen and pelvis and right upper quadrant ultrasound unremarkable, no cholecystitis/cholelithiasis or obstruction.   EGD done by gastroenterology showed 2 small antral erosions otherwise negative. Patient underwent MRCP which was negative with no evidence of biliary dilatation or choledocholithiasis.   HYPERLIPIDEMIA - Stable   HYPERTENSION - Continue Avapro, BP stable   Type 2 diabetes mellitus - Patient not on any medications outpatient for diabetes   GERD:  - EGD showed 2 small antral erosions otherwise  no ulcers, continue PPI . Patient was also given a prescription of simethicone for abdominal distention/flatulence.     Day of Discharge BP 158/75  Pulse 60  Temp(Src) 97.5 F (36.4 C) (Oral)  Resp 16  Ht 6\' 1"  (1.854 m)  Wt 88.905 kg (196 lb)  BMI 25.86 kg/m2  SpO2 97%  Physical Exam: General: Alert and awake oriented x3 not in any acute distress. CVS: S1-S2 clear no murmur rubs or gallops Chest: clear to auscultation bilaterally, no wheezing rales  or rhonchi Abdomen: soft nontender, nondistended, normal bowel sounds Extremities: no cyanosis, clubbing or edema noted bilaterally Neuro: Cranial nerves II-XII intact, no focal neurological deficits   The results of significant diagnostics from this hospitalization (including imaging, microbiology, ancillary and laboratory) are listed below for reference.    LAB RESULTS: Basic Metabolic Panel:  Recent Labs Lab 03/12/14 1428 03/13/14 0730  NA 142 142  K 3.9 4.3  CL 102 103  CO2 26 26  GLUCOSE 89 93  BUN 10 8  CREATININE 0.96 0.97  CALCIUM 9.1 9.4   Liver Function Tests:  Recent Labs Lab 03/12/14 1428  AST 19  ALT 13  ALKPHOS 90  BILITOT 0.2*  PROT 6.8  ALBUMIN 4.0    Recent Labs Lab 03/12/14 1428 03/13/14 0730  LIPASE 33 35   No results found for this basename: AMMONIA,  in the last 168 hours CBC:  Recent Labs Lab 03/12/14 1428 03/13/14 0730  WBC 9.4 8.6  HGB 15.9 15.6  HCT 47.0 46.5  MCV 91.4 92.4  PLT 208 205   Cardiac Enzymes:  Recent Labs Lab 03/12/14 2320 03/13/14 0730  TROPONINI <0.30 <0.30   BNP: No components found with this basename: POCBNP,  CBG: No results found for this basename: GLUCAP,  in the last 168 hours  Significant Diagnostic Studies:  Ct Abdomen Pelvis W Contrast  03/12/2014   CLINICAL DATA:  Abdominal pain  EXAM: CT ABDOMEN AND PELVIS WITH CONTRAST  TECHNIQUE: Multidetector CT imaging of the abdomen and pelvis was performed using the standard protocol following bolus administration of intravenous contrast.  CONTRAST:  188mL OMNIPAQUE IOHEXOL 300 MG/ML  SOLN  COMPARISON:  Ultrasound from earlier in the day, 02/14/2012  FINDINGS: The lung bases are free of acute infiltrate or sizable effusion. Some bullous changes are noted in the right lung base. Small air-fluid levels are noted within of uncertain significance/ chronicity.  The liver, gallbladder, spleen, adrenal glands and pancreas are all normal in their CT appearance. The  kidneys demonstrate a normal enhancement pattern without evidence of obstruction or renal calculi. Small exophytic cyst is noted arising from the lower anterior aspect of the left kidney. Delayed images through the kidneys demonstrate adequate excretion of contrast material. Bladder is well distended.  The appendix is not visualized consistent with the surgical history. Mild diverticular change of the colon is seen without evidence of diverticulitis. No pelvic mass lesion or sidewall abnormality is noted. The osseous structures show degenerative change of the lumbar spine.  IMPRESSION: No acute intra-abdominal abnormality is noted.  Stable left renal cyst.  Bullous changes with air-fluid levels in the right lower lobe of the lung. These are likely chronic in nature.   Electronically Signed   By: Inez Catalina M.D.   On: 03/12/2014 17:46   US Abdomen Limited Ruq  03/12/2014   CLINICAL DATA:  Abdominal pain  EXAM: US ABDOMEN LIMITED - RIGHT UPPER QUADRANT  COMPARISON:  None.  FINDINGS: Gallbladder:  No gallstones or  wall thickening visualized. No sonographic Murphy sign noted.  Common bile duct:  Diameter: 3 mm in diameter within normal limits.  Liver:  No focal lesion identified. Within normal limits in parenchymal echogenicity.  IMPRESSION: Unremarkable right upper quadrant ultrasound.   Electronically Signed   By: Lahoma Crocker M.D.   On: 03/12/2014 15:41      Disposition and Follow-up:     Discharge Instructions   Diet - low sodium heart healthy    Complete by:  As directed      Increase activity slowly    Complete by:  As directed             DISPOSITION: Home DIET: Heart healthy diet   DISCHARGE FOLLOW-UP Follow-up Information   Follow up with Luis Post, MD. Schedule an appointment as soon as possible for a visit in 2 weeks. (for hospital follow-up)    Specialty:  Family Medicine   Contact information:   [place] [address] [phone]       Follow  up with Cleotis Nipper, MD. Schedule an appointment as soon as possible for a visit in 2 weeks. (for hospital follow-up)    Specialty:  Gastroenterology   Contact information:   [address], [place]  [address] [phone]       Time spent on Discharge: 45 mins  Signed:   Iris Hairston M.D. [facility] 03/14/2014, 3:27 PM Pager: [phone]   **Disclaimer: This note was dictated with voice recognition software. Similar sounding words can inadvertently be transcribed and this note may contain transcription errors which may not have been corrected upon publication of note.**

## 2014-03-14 NOTE — Progress Notes (Signed)
[facility] Progress Note  Subjective: Bloated feeling. No significant pain. MRCP normal with no evidence of CBD stones.  Objective: Vital signs in last 24 hours: Temp:  [97.9 F (36.6 C)-98.1 F (36.7 C)] 97.9 F (36.6 C) (07/31 0556) Pulse Rate:  [59-62] 62 (07/31 0556) Resp:  [16] 16 (07/31 0556) BP: (109-134)/(65-77) 134/77 mmHg (07/31 0556) SpO2:  [97 %-99 %] 97 % (07/31 0556) Weight change:    PE: No distress Abdomen soft  Lab Results: No results found for this or any previous visit (from the past 24 hour(s)).  Studies/Results: No results found.    Assessment: Bloating. ? If he had pancreatitis  Plan: Advance diet. OK for discharge. Follow up with Dr Cristina Gong.    Wonda Horner 03/14/2014, 2:34 PM

## 2014-03-14 NOTE — Progress Notes (Signed)
Patient ID: [name]  male  CLE:[id number]    DOB: 01/27/1946    DOA: 03/12/2014  PCP: Eulas Post, MD  Assessment/Plan: Principal Problem:   Pancreatitis, acute; unclear etiology - CT abdomen and pelvis and right upper quadrant ultrasound unremarkable, no cholecystitis/cholelithiasis or obstruction - EGD done by gastroenterology showed 2 small antral erosions otherwise negative, recommended MRCP  Active Problems:   HYPERLIPIDEMIA - Stable    HYPERTENSION - Continue Avapro, BP stable    Type 2 diabetes mellitus - Patient not on any medications outpatient for diabetes  GERD: - EGD showed 2 small antral erosions otherwise no ulcers, continue PPI   DVT Prophylaxis:  Code Status: Full code  Family Communication: Discussed in detail with patient's wife at the bedside  Disposition:  Consultants:  Gastroenterology  Procedures:  EGD  MRCP  Antibiotics:  None    Subjective: Patient seen and examined, feeling better although does have slight right upper quadrant abdominal pain, patient also reported that after the EGD once he started diet, he started having the same symptoms of nausea, bloating, abdominal pain yesterday  Objective: Weight change:   Intake/Output Summary (Last 24 hours) at 03/14/14 1229 Last data filed at 03/14/14 0900  Gross per 24 hour  Intake 1121.25 ml  Output      0 ml  Net 1121.25 ml   Blood pressure 134/77, pulse 62, temperature 97.9 F (36.6 C), temperature source Oral, resp. rate 16, height 6\' 1"  (1.854 m), weight 88.905 kg (196 lb), SpO2 97.00%.  Physical Exam: General: Alert and awake, oriented x3, not in any acute distress. CVS: S1-S2 clear, no murmur rubs or gallops Chest: clear to auscultation bilaterally, no wheezing, rales or rhonchi Abdomen: Right upper quadrant abdominal tenderness to deep palpation, nondistended, normal bowel sounds  Extremities: no cyanosis, clubbing or edema noted bilaterally   Lab  Results: Basic Metabolic Panel:  Recent Labs Lab 03/12/14 1428 03/13/14 0730  NA 142 142  K 3.9 4.3  CL 102 103  CO2 26 26  GLUCOSE 89 93  BUN 10 8  CREATININE 0.96 0.97  CALCIUM 9.1 9.4   Liver Function Tests:  Recent Labs Lab 03/12/14 1428  AST 19  ALT 13  ALKPHOS 90  BILITOT 0.2*  PROT 6.8  ALBUMIN 4.0    Recent Labs Lab 03/12/14 1428 03/13/14 0730  LIPASE 33 35   No results found for this basename: AMMONIA,  in the last 168 hours CBC:  Recent Labs Lab 03/12/14 1428 03/13/14 0730  WBC 9.4 8.6  HGB 15.9 15.6  HCT 47.0 46.5  MCV 91.4 92.4  PLT 208 205   Cardiac Enzymes:  Recent Labs Lab 03/12/14 1428 03/12/14 2320 03/13/14 0730  TROPONINI <0.30 <0.30 <0.30   BNP: No components found with this basename: POCBNP,  CBG: No results found for this basename: GLUCAP,  in the last 168 hours   Micro Results: No results found for this or any previous visit (from the past 240 hour(s)).  Studies/Results: Ct Abdomen Pelvis W Contrast  03/12/2014   CLINICAL DATA:  Abdominal pain  EXAM: CT ABDOMEN AND PELVIS WITH CONTRAST  TECHNIQUE: Multidetector CT imaging of the abdomen and pelvis was performed using the standard protocol following bolus administration of intravenous contrast.  CONTRAST:  145mL OMNIPAQUE IOHEXOL 300 MG/ML  SOLN  COMPARISON:  Ultrasound from earlier in the day, 02/14/2012  FINDINGS: The lung bases are free of acute infiltrate or sizable effusion. Some bullous changes are noted in the right  lung base. Small air-fluid levels are noted within of uncertain significance/ chronicity.  The liver, gallbladder, spleen, adrenal glands and pancreas are all normal in their CT appearance. The kidneys demonstrate a normal enhancement pattern without evidence of obstruction or renal calculi. Small exophytic cyst is noted arising from the lower anterior aspect of the left kidney. Delayed images through the kidneys demonstrate adequate excretion of contrast  material. Bladder is well distended.  The appendix is not visualized consistent with the surgical history. Mild diverticular change of the colon is seen without evidence of diverticulitis. No pelvic mass lesion or sidewall abnormality is noted. The osseous structures show degenerative change of the lumbar spine.  IMPRESSION: No acute intra-abdominal abnormality is noted.  Stable left renal cyst.  Bullous changes with air-fluid levels in the right lower lobe of the lung. These are likely chronic in nature.   Electronically Signed   By: Inez Catalina M.D.   On: 03/12/2014 17:46   US Abdomen Limited Ruq  03/12/2014   CLINICAL DATA:  Abdominal pain  EXAM: US ABDOMEN LIMITED - RIGHT UPPER QUADRANT  COMPARISON:  None.  FINDINGS: Gallbladder:  No gallstones or wall thickening visualized. No sonographic Murphy sign noted.  Common bile duct:  Diameter: 3 mm in diameter within normal limits.  Liver:  No focal lesion identified. Within normal limits in parenchymal echogenicity.  IMPRESSION: Unremarkable right upper quadrant ultrasound.   Electronically Signed   By: Lahoma Crocker M.D.   On: 03/12/2014 15:41    Medications: Scheduled Meds: . enoxaparin (LOVENOX) injection  40 mg Subcutaneous Q24H  . irbesartan  150 mg Oral Daily  . nebivolol  5 mg Oral QPC supper  . pantoprazole  40 mg Oral Daily  . topiramate  25 mg Oral QHS      LOS: 2 days   Beda Dula M.D. [facility] 03/14/2014, 12:29 PM Pager: [phone]  If 7PM-7AM, please contact night-coverage [email or web address] Password [email or web address]  **Disclaimer: This note was dictated with voice recognition software. Similar sounding words can inadvertently be transcribed and this note may contain transcription errors which may not have been corrected upon publication of note.**

## 2014-03-17 ENCOUNTER — Encounter (HOSPITAL_COMMUNITY): Payer: Self-pay | Admitting: Gastroenterology

## 2014-03-31 DIAGNOSIS — R1013 Epigastric pain: Secondary | ICD-10-CM | POA: Diagnosis not present

## 2014-04-03 ENCOUNTER — Encounter (HOSPITAL_COMMUNITY): Payer: Self-pay | Admitting: *Deleted

## 2014-04-03 ENCOUNTER — Encounter (HOSPITAL_COMMUNITY): Payer: Self-pay | Admitting: Pharmacy Technician

## 2014-04-08 ENCOUNTER — Ambulatory Visit (INDEPENDENT_AMBULATORY_CARE_PROVIDER_SITE_OTHER): Payer: Medicare Other | Admitting: Surgery

## 2014-04-08 ENCOUNTER — Encounter (INDEPENDENT_AMBULATORY_CARE_PROVIDER_SITE_OTHER): Payer: Self-pay | Admitting: Surgery

## 2014-04-08 VITALS — BP 126/80 | HR 52 | Temp 98.6°F | Ht 73.0 in | Wt 196.2 lb

## 2014-04-08 DIAGNOSIS — K828 Other specified diseases of gallbladder: Secondary | ICD-10-CM | POA: Diagnosis not present

## 2014-04-08 DIAGNOSIS — R1013 Epigastric pain: Secondary | ICD-10-CM

## 2014-04-08 NOTE — Progress Notes (Signed)
General Surgery [facility]  Chief Complaint  Patient presents with  . New Evaluation    abdominal pain - suspect biliary dyskinesia - referral from Dr. Ronald Lobo    HISTORY: Patient is a 68 year old male referred by his gastroenterologist for evaluation of abdominal pain. Patient has a complex history dating back to July of 2013. He developed intermittent epigastric abdominal pain. He noted a mild fatty food intolerance. Evaluation in July 2013 included abdominal ultrasound and a nuclear medicine hepatobiliary scan. Ultrasound was negative for cholelithiasis. Ejection fraction on hepatobiliary scan however was markedly low at 1.1%.  Patient persisted with intermittent abdominal pain. Beginning in May 2015 he had more frequent symptoms associated with "bloating", and diarrhea. Patient has undergone an extensive evaluation including upper endoscopy, abdominal ultrasound, and MRCP. Laboratory studies during a recent episode of pain showed an elevated lipase level at 297. This rapidly returned to normal. Patient is now referred for consideration for cholecystectomy for suspected biliary dyskinesia.  Patient denies any history of jaundice or acholic stools. He denies fevers or chills. Previous abdominal surgery is limited to appendectomy in 1959. There is no family history of hepatobiliary disease.  Past Medical History  Diagnosis Date  . HYPERLIPIDEMIA 01/26/2009  . HYPERTENSION 01/26/2009  . 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 GERD (gastroesophageal reflux disease)     Current Outpatient Prescriptions  Medication Sig Dispense Refill  . carisoprodol (SOMA) 350 MG tablet Take 350 mg by mouth 3 (three) times daily as needed for muscle spasms.       [name] HYDROcodone-acetaminophen (NORCO/VICODIN) 5-325 MG per tablet Take 1 tablet by mouth every 6 (six) hours as needed for moderate pain.      . nebivolol (BYSTOLIC) 5 MG tablet Take 5 mg by mouth daily  after supper.      . olmesartan (BENICAR) 40 MG tablet Take 40 mg by mouth daily after supper.      . hydrochlorothiazide (HYDRODIURIL) 12.5 MG tablet Take 12.5 mg by mouth every morning.       . simethicone (MYLICON) 80 MG chewable tablet Chew 1 tablet (80 mg total) by mouth 4 (four) times daily as needed for flatulence (gas and abdominal distension).  90 tablet  3   No current facility-administered medications for this visit.    Allergies  Allergen Reactions  . Penicillins Itching    Family History  Problem Relation Age of Onset  . Hypertension Mother   . Hypertension Father   . Arthritis Sister     rheumatoid  . Stroke Sister 37    History   Social History  . Marital Status: Married    Spouse Name: N/A    Number of Children: N/A  . Years of Education: N/A   Social History Main Topics  . Smoking status: Former Smoker -- 1.00 packs/day for 5 years    Types: Cigarettes    Quit date: 10/17/1968  . Smokeless tobacco: Never Used  . Alcohol Use: Yes     Comment: "quit drinking ~ 10/1968  . Drug Use: No  . Sexual Activity: None   Other Topics Concern  . None   Social History Narrative  . None    REVIEW OF SYSTEMS - PERTINENT POSITIVES ONLY: Denies jaundice. Denies acholic stools. Denies fevers or chills. No significant weight changes. No prior pancreatic or hepatobiliary disease.  EXAM: Filed Vitals:   04/08/14 1128  BP: 126/80  Pulse: 52  Temp: 98.6 F (37 C)    GENERAL:  well-developed, well-nourished, no acute distress HEENT: normocephalic; pupils equal and reactive; sclerae clear; dentition good; mucous membranes moist NECK:  No palpable masses in the thyroid bed; symmetric on extension; no palpable anterior or posterior cervical lymphadenopathy; no supraclavicular masses; no tenderness CHEST: clear to auscultation bilaterally without rales, rhonchi, or wheezes CARDIAC: regular rate and rhythm without significant murmur; peripheral pulses are  full ABDOMEN: soft without distension; bowel sounds present; no mass; no hepatosplenomegaly; no hernia; well-healed appendectomy incision right lower quadrant EXT:  non-tender without edema; no deformity NEURO: no gross focal deficits; no sign of tremor   LABORATORY RESULTS: See [facility] ([facility]Epic) for most recent results  RADIOLOGY RESULTS: See [facility] (CHL-Epic) for most recent results  IMPRESSION: Intermittent abdominal pain, suspect biliary dyskinesia  PLAN: The patient and I had a lengthy discussion reviewing all of the above information and studies. After careful consideration, I have recommended that he consider laparoscopic cholecystectomy both from a diagnostic and therapeutic standpoint. It has been my experience that approximately 50-60% of patients with this clinical symptomatology benefit from cholecystectomy. Patient and I discussed the procedure at length and I provided him with written literature to review at home. We discussed potential complications including the need for conversion to open surgery. We discussed performing intraoperative cholangiography. We discussed his hospital stay and his postoperative recovery and return to activities and work. Patient understands and wishes to proceed with surgery in the near future.  The risks and benefits of the procedure have been discussed at length with the patient.  The patient understands the proposed procedure, potential alternative treatments, and the course of recovery to be expected.  All of the patient's questions have been answered at this time.  The patient wishes to proceed with surgery.  Earnstine Regal, MD, [facility]  Primary Care Physician: Eulas Post, MD

## 2014-04-08 NOTE — Patient Instructions (Signed)
  [facility]  LAPAROSCOPIC SURGERY - POST-OP INSTRUCTIONS  Always review your discharge instruction sheet given to you by the facility where your surgery was performed.  A prescription for pain medication may be given to you upon discharge.  Take your pain medication as prescribed.  If narcotic pain medicine is not needed, then you may take acetaminophen (Tylenol) or ibuprofen (Advil) as needed.  Take your usually prescribed medications unless otherwise directed.  If you need a refill on your pain medication, please contact your pharmacy.  They will contact our office to request authorization. Prescriptions will not be filled after 5 P.M. or on weekends.  You should follow a light diet the first few days after arrival home, such as soup and crackers or toast.  Be sure to include plenty of fluids daily.  Most patients will experience some swelling and bruising in the area of the incisions.  Ice packs will help.  Swelling and bruising can take several days to resolve.   It is common to experience some constipation if taking pain medication after surgery.  Increasing fluid intake and taking a stool softener (such as Colace) will usually help or prevent this problem from occurring.  A mild laxative (Milk of Magnesia or Miralax) should be taken according to package instructions if there are no bowel movements after 48 hours.  Unless discharge instructions indicate otherwise, you may remove your bandages 24-48 hours after surgery, and you may shower at that time.  You may have steri-strips (small skin tapes) in place directly over the incision.  These strips should be left on the skin for 7-10 days.  If your surgeon used skin glue on the incision, you may shower in 24 hours.  The glue will flake off over the next 2-3 weeks.  Any sutures or staples will be removed at the office during your follow-up visit.  ACTIVITIES:  You may resume regular (light) daily activities beginning the  next day-such as daily self-care, walking, climbing stairs-gradually increasing activities as tolerated.  You may have sexual intercourse when it is comfortable.  Refrain from any heavy lifting or straining until approved by your doctor.  You may drive when you are no longer taking prescription pain medication, you can comfortably wear a seatbelt, and you can safely maneuver your car and apply brakes.  You should see your doctor in the office for a follow-up appointment approximately 2-3 weeks after your surgery.  Make sure that you call for this appointment within a day or two after you arrive home to insure a convenient appointment time.  WHEN TO CALL YOUR DOCTOR: 1. Fever over 101.0 2. Inability to urinate 3. Continued bleeding from incision 4. Increased pain, redness, or drainage from the incision 5. Increasing abdominal pain  The clinic staff is available to answer your questions during regular business hours.  Please don't hesitate to call and ask to speak to one of the nurses for clinical concerns.  If you have a medical emergency, go to the nearest emergency room or call 911.  A surgeon from [facility] is always on call for the hospital.  Marilyne Haseley M. Neyah Ellerman, MD, FACS [facility] Office: [phone] Toll Free:  [phone] FAX [phone]  Web site: [email or web address] 

## 2014-04-10 ENCOUNTER — Encounter (HOSPITAL_COMMUNITY): Payer: Self-pay | Admitting: Pharmacy Technician

## 2014-04-10 ENCOUNTER — Encounter (INDEPENDENT_AMBULATORY_CARE_PROVIDER_SITE_OTHER): Payer: Self-pay

## 2014-04-10 ENCOUNTER — Ambulatory Visit (HOSPITAL_COMMUNITY)
Admission: RE | Admit: 2014-04-10 | Discharge: 2014-04-10 | Disposition: A | Discharge status: Home / Self Care | Payer: Medicare Other | Source: Ambulatory Visit | Attending: Anesthesiology | Admitting: Anesthesiology

## 2014-04-10 ENCOUNTER — Encounter (HOSPITAL_COMMUNITY): Payer: Self-pay

## 2014-04-10 ENCOUNTER — Encounter (HOSPITAL_COMMUNITY)
Admission: RE | Admit: 2014-04-10 | Discharge: 2014-04-10 | Disposition: A | Discharge status: Home / Self Care | Payer: Medicare Other | Source: Ambulatory Visit | Attending: Surgery | Admitting: Surgery

## 2014-04-10 DIAGNOSIS — Z01818 Encounter for other preprocedural examination: Secondary | ICD-10-CM | POA: Insufficient documentation

## 2014-04-10 DIAGNOSIS — K828 Other specified diseases of gallbladder: Secondary | ICD-10-CM | POA: Diagnosis not present

## 2014-04-10 HISTORY — DX: Personal history of other medical treatment: Z92.89

## 2014-04-10 LAB — CBC
HCT: 45.6 % (ref 39.0–52.0)
Hemoglobin: 15.6 g/dL (ref 13.0–17.0)
MCH: 30.8 pg (ref 26.0–34.0)
MCHC: 34.2 g/dL (ref 30.0–36.0)
MCV: 90.1 fL (ref 78.0–100.0)
Platelets: 215 10*3/uL (ref 150–400)
RBC: 5.06 MIL/uL (ref 4.22–5.81)
RDW: 12.9 % (ref 11.5–15.5)
WBC: 8.8 10*3/uL (ref 4.0–10.5)

## 2014-04-10 LAB — BASIC METABOLIC PANEL
Anion gap: 13 (ref 5–15)
BUN: 12 mg/dL (ref 6–23)
CO2: 26 mEq/L (ref 19–32)
Calcium: 9.6 mg/dL (ref 8.4–10.5)
Chloride: 103 mEq/L (ref 96–112)
Creatinine, Ser: 0.95 mg/dL (ref 0.50–1.35)
GFR calc Af Amer: >90 mL/min (ref >90)
GFR calc non Af Amer: 84 mL/min — ABNORMAL LOW (ref >90)
Glucose, Bld: 96 mg/dL (ref 70–99)
Potassium: 4.2 mEq/L (ref 3.7–5.3)
Sodium: 142 mEq/L (ref 137–147)

## 2014-04-10 NOTE — Progress Notes (Signed)
Quick Note:  EKG is acceptable for scheduled surgery.  Sael Furches M. Lazette Estala, MD, FACS [facility] Office: [phone]   ______ 

## 2014-04-10 NOTE — Progress Notes (Signed)
Quick Note:  These results are acceptable for scheduled surgery.  Aerial Dilley M. Leanda Padmore, MD, FACS [facility] Office: [phone]   ______ 

## 2014-04-10 NOTE — Patient Instructions (Addendum)
20 Luis Guzman  04/10/2014   Your procedure is scheduled on:   04-15-2014 Tuesday  Enter through [facility] Entrance and follow signs to [facility]. Arrive at     1000   AM ..  Call this number if you have problems the morning of surgery: [phone]  Or Presurgical Testing [phone].   For Living Will and/or Health Care Power Attorney Forms: please provide copy for your medical record,may bring AM of surgery(Forms should be already notarized -we do not provide this service).(8-27-15Yes/  Information combined with financial will  Prefer not to bring).      Do not eat food/ or drink: After Midnight.     Take these medicines the morning of surgery with A SIP OF WATER: none. Take Bystolic(PM before as usual).   Do not wear jewelry, make-up or nail polish.  Do not wear lotions, powders, or perfumes. You may wear deodorant.  Do not shave 48 hours(2 days) prior to first CHG shower(legs and under arms).(Shaving face and neck okay.)  Do not bring valuables to the hospital.(Hospital is not responsible for lost valuables).  Contacts, dentures or removable bridgework, body piercing, hair pins may not be worn into surgery.  Leave suitcase in the car. After surgery it may be brought to your room.  For patients admitted to the hospital, checkout time is 11:00 AM the day of discharge.(Restricted visitors-Any Persons displaying flu-like symptoms or illness).    Patients discharged the day of surgery will not be allowed to drive home. Must have responsible person with you x 24 hours once discharged.  Name and phone number of your driver: Ivin Booty spouse [phone] cell  Special Instructions: CHG(Chlorhedine 4%-"Hibiclens","Betasept","Aplicare") Shower Use Special Wash: see special instructions.(avoid face and genitals)       _____________________________    [facility] - Preparing for Surgery Before surgery, you can play an important role.  Because skin is not sterile, your skin  needs to be as free of germs as possible.  You can reduce the number of germs on your skin by washing with CHG (chlorahexidine gluconate) soap before surgery.  CHG is an antiseptic cleaner which kills germs and bonds with the skin to continue killing germs even after washing. Please DO NOT use if you have an allergy to CHG or antibacterial soaps.  If your skin becomes reddened/irritated stop using the CHG and inform your nurse when you arrive at Short Stay. Do not shave (including legs and underarms) for at least 48 hours prior to the first CHG shower.  You may shave your face/neck. Please follow these instructions carefully:  1.  Shower with CHG Soap the night before surgery and the  morning of Surgery.  2.  If you choose to wash your hair, wash your hair first as usual with your  normal  shampoo.  3.  After you shampoo, rinse your hair and body thoroughly to remove the  shampoo.                           4.  Use CHG as you would any other liquid soap.  You can apply chg directly  to the skin and wash                       Gently with a scrungie or clean washcloth.  5.  Apply the CHG Soap to your body ONLY FROM THE NECK DOWN.   Do  not use on face/ open                           Wound or open sores. Avoid contact with eyes, ears mouth and genitals (private parts).                       Wash face,  Genitals (private parts) with your normal soap.             6.  Wash thoroughly, paying special attention to the area where your surgery  will be performed.  7.  Thoroughly rinse your body with warm water from the neck down.  8.  DO NOT shower/wash with your normal soap after using and rinsing off  the CHG Soap.                9.  Pat yourself dry with a clean towel.            10.  Wear clean pajamas.            11.  Place clean sheets on your bed the night of your first shower and do not  sleep with pets. Day of Surgery : Do not apply any lotions/deodorants the morning of surgery.  Please wear clean  clothes to the hospital/surgery center.  FAILURE TO FOLLOW THESE INSTRUCTIONS MAY RESULT IN THE CANCELLATION OF YOUR SURGERY PATIENT SIGNATURE_________________________________  NURSE SIGNATURE__________________________________  ________________________________________________________________________

## 2014-04-10 NOTE — Progress Notes (Signed)
Quick Note:  Pre-operative chest x-ray is acceptable for scheduled surgery.  Kalijah Westfall M. Zhaniya Swallows, MD, FACS [facility] Office: [phone]   ______ 

## 2014-04-10 NOTE — Pre-Procedure Instructions (Signed)
04-10-14 EKG/ CXR done today.

## 2014-04-11 NOTE — Progress Notes (Signed)
Quick Note:  EKG is acceptable for scheduled surgery.  Sameera Betton M. Trapper Meech, MD, FACS [facility] Office: [phone]   ______ 

## 2014-04-11 NOTE — Progress Notes (Signed)
Quick Note:  These results are acceptable for scheduled surgery.  Sandrika Schwinn M. Miciah Shealy, MD, FACS [facility] Office: [phone]   ______ 

## 2014-04-14 ENCOUNTER — Ambulatory Visit (INDEPENDENT_AMBULATORY_CARE_PROVIDER_SITE_OTHER): Payer: Medicare Other | Admitting: Family Medicine

## 2014-04-14 ENCOUNTER — Encounter: Payer: Self-pay | Admitting: Family Medicine

## 2014-04-14 VITALS — BP 122/74 | HR 60 | Temp 98.5°F | Ht 73.0 in | Wt 196.0 lb

## 2014-04-14 DIAGNOSIS — M159 Polyosteoarthritis, unspecified: Secondary | ICD-10-CM

## 2014-04-14 DIAGNOSIS — I10 Essential (primary) hypertension: Secondary | ICD-10-CM | POA: Diagnosis not present

## 2014-04-14 DIAGNOSIS — Z0000 Encounter for general adult medical examination without abnormal findings: Secondary | ICD-10-CM | POA: Diagnosis not present

## 2014-04-14 DIAGNOSIS — E785 Hyperlipidemia, unspecified: Secondary | ICD-10-CM

## 2014-04-14 LAB — PSA: PSA: 3.37 ng/mL (ref 0.10–4.00)

## 2014-04-14 MED ORDER — CARISOPRODOL 350 MG PO TABS: 350.0000 mg | ORAL_TABLET | Freq: Three times a day (TID) | ORAL | Status: DC | PRN

## 2014-04-14 MED ORDER — OXYCODONE-ACETAMINOPHEN 10-650 MG PO TABS: 1.0000 | ORAL_TABLET | Freq: Four times a day (QID) | ORAL | Status: DC | PRN

## 2014-04-14 NOTE — Patient Instructions (Signed)
Return this Fall for tetanus booster and Flu vaccine.

## 2014-04-14 NOTE — Progress Notes (Signed)
Subjective:    Patient ID: Luis Guzman, male    DOB: 11-02-1945, 68 y.o.   MRN: [id number]  HPI Patient seen for Medicare wellness exam and medical followup. He has history of type 2 diabetes, hypertension, hyperlipidemia, recurrent acute pancreatitis.  He has scheduled laparoscopic cholecystectomy tomorrow. He has history of fairly severe hyperlipidemia but has been intolerant of many statins in the past. His blood pressure is controlled with Benicar and Bystolic but he would like to look at possible medication changes soon because of cost issues. No cardiac history. No recent chest pains.  Immunizations reviewed. Last tetanus 10 years ago. He wishes to defer booster until after his upcoming surgery. Still needs flu vaccine. He's had previous shingles vaccine and pneumonia vaccine.  He has osteoarthritis mostly involving the back. He rarely takes oxycodone for that along with muscle relaxer. Requesting refills.  Past Medical History  Diagnosis Date  . HYPERLIPIDEMIA 01/26/2009  . HYPERTENSION 01/26/2009  . GERD (gastroesophageal reflux disease)   . Transfusion history     infant "anemia"  . 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 lower back pain, s/p cervical neck surgery"stiffness" remains   Past Surgical History  Procedure Laterality Date  . Cervical laminectomy  1997    C 4 and C 5  . Shoulder arthroscopy w/ rotator cuff repair Bilateral 2004-2009    left 2004, right 2009  . Orif fibula fracture Left 09/2008    compartment syndrome  . Appendectomy  ~ 1959  . Esophagogastroduodenoscopy N/A 03/13/2014    Procedure: ESOPHAGOGASTRODUODENOSCOPY (EGD);  Surgeon: Wonda Horner, MD;  Location: [facility] ENDOSCOPY;  Service: Endoscopy;  Laterality: N/A;  . Back surgery    . Vasectomy      reports that he quit smoking about 45 years ago. His smoking use included Cigarettes. He has a 5 pack-year smoking history. He has never used smokeless tobacco. He reports that he drinks  alcohol. He reports that he does not use illicit drugs. family history includes Arthritis in his sister; Hypertension in his father and mother; Stroke (age of onset: 59) in his sister. Allergies  Allergen Reactions  . Penicillins Itching   1.  Risk factors based on Past Medical , Social, and Family history reviewed and as indicated above with no changes 2.  Limitations in physical activities None.  No recent falls. 3.  Depression/mood No active depression or anxiety issues 4.  Hearing No major defiits 5.  ADLs independent in all. 6.  Cognitive function (orientation to time and place, language, writing, speech,memory) no short or long term memory issues.  Language and judgement intact. 7.  Home Safety no issues 8.  Height, weight, and visual acuity.all stable. 9.  Counseling discussed exercise habits 10. Recommendation of preventive services. Flu vaccine and he will get in next month. 11. Labs based on risk factors PSA, 12. Care Plan yearly flu vaccine, every 10 year tetanus. 13. Other Providers Dr Harlow Asa (surgery), Dr Paulita Fujita (GI) 82. Written schedule of screening/prevention services given to patient.    Review of Systems  Constitutional: Negative for fatigue.  Eyes: Negative for visual disturbance.  Respiratory: Negative for cough, chest tightness and shortness of breath.   Cardiovascular: Negative for chest pain, palpitations and leg swelling.  Gastrointestinal: Positive for abdominal pain. Negative for nausea, vomiting and diarrhea.  Endocrine: Negative for polydipsia and polyuria.  Genitourinary: Negative for dysuria.  Musculoskeletal: Positive for back pain.  Skin: Negative for rash.  Neurological: Negative for dizziness, syncope,  weakness, light-headedness and headaches.  Psychiatric/Behavioral: Negative for confusion and dysphoric mood.       Objective:   Physical Exam  Constitutional: He is oriented to person, place, and time. He appears well-developed and  well-nourished.  HENT:  Right Ear: External ear normal.  Left Ear: External ear normal.  Mouth/Throat: Oropharynx is clear and moist.  Eyes: Pupils are equal, round, and reactive to light.  Neck: Neck supple. No thyromegaly present.  Cardiovascular: Normal rate and regular rhythm.   Pulmonary/Chest: Effort normal and breath sounds normal. No respiratory distress. He has no wheezes. He has no rales.  Musculoskeletal: He exhibits no edema.  Neurological: He is alert and oriented to person, place, and time.          Assessment & Plan:  #1 health maintenance. Recommendation for flu vaccine and tetanus booster and he wishes to return later for those. Other immunizations up to date. Colonoscopy up to date. Patient requests PSA testing after discussion of risks and benefits #2 hypertension which is well controlled. Consider switching to generic medications for his Benicar and Bystolic #3 hyperlipidemia. Intolerant of many statins. Consider repeat trial of livalo after his surgery. #4 osteoarthritis-esp back.  Infrequently takes Soma and Oxycodone.  Wrote for limited supply.

## 2014-04-14 NOTE — Progress Notes (Signed)
Pre visit review using our clinic review tool, if applicable. No additional management support is needed unless otherwise documented below in the visit note. 

## 2014-04-15 ENCOUNTER — Encounter (HOSPITAL_COMMUNITY): Payer: Self-pay

## 2014-04-15 ENCOUNTER — Ambulatory Visit (HOSPITAL_COMMUNITY): Payer: Medicare Other | Admitting: Anesthesiology

## 2014-04-15 ENCOUNTER — Encounter (HOSPITAL_COMMUNITY)
Admission: RE | Disposition: A | Discharge status: Home / Self Care | Payer: Self-pay | Source: Ambulatory Visit | Attending: Surgery

## 2014-04-15 ENCOUNTER — Observation Stay (HOSPITAL_COMMUNITY)
Admission: RE | Admit: 2014-04-15 | Discharge: 2014-04-15 | Disposition: A | Discharge status: Home / Self Care | Payer: Medicare Other | Source: Ambulatory Visit | Attending: Surgery | Admitting: Surgery

## 2014-04-15 ENCOUNTER — Encounter (HOSPITAL_COMMUNITY): Payer: Medicare Other | Admitting: Anesthesiology

## 2014-04-15 ENCOUNTER — Ambulatory Visit (HOSPITAL_COMMUNITY): Payer: Medicare Other

## 2014-04-15 DIAGNOSIS — Z880 Allergy status to penicillin: Secondary | ICD-10-CM | POA: Insufficient documentation

## 2014-04-15 DIAGNOSIS — D134 Benign neoplasm of liver: Secondary | ICD-10-CM | POA: Diagnosis not present

## 2014-04-15 DIAGNOSIS — K219 Gastro-esophageal reflux disease without esophagitis: Secondary | ICD-10-CM | POA: Diagnosis not present

## 2014-04-15 DIAGNOSIS — Z87891 Personal history of nicotine dependence: Secondary | ICD-10-CM | POA: Diagnosis not present

## 2014-04-15 DIAGNOSIS — I10 Essential (primary) hypertension: Secondary | ICD-10-CM | POA: Diagnosis not present

## 2014-04-15 DIAGNOSIS — Z79899 Other long term (current) drug therapy: Secondary | ICD-10-CM | POA: Diagnosis not present

## 2014-04-15 DIAGNOSIS — K811 Chronic cholecystitis: Principal | ICD-10-CM | POA: Insufficient documentation

## 2014-04-15 DIAGNOSIS — K828 Other specified diseases of gallbladder: Secondary | ICD-10-CM | POA: Diagnosis present

## 2014-04-15 DIAGNOSIS — E785 Hyperlipidemia, unspecified: Secondary | ICD-10-CM | POA: Insufficient documentation

## 2014-04-15 DIAGNOSIS — K8020 Calculus of gallbladder without cholecystitis without obstruction: Secondary | ICD-10-CM | POA: Diagnosis not present

## 2014-04-15 DIAGNOSIS — R109 Unspecified abdominal pain: Secondary | ICD-10-CM | POA: Diagnosis not present

## 2014-04-15 DIAGNOSIS — M159 Polyosteoarthritis, unspecified: Secondary | ICD-10-CM | POA: Diagnosis not present

## 2014-04-15 DIAGNOSIS — R1013 Epigastric pain: Secondary | ICD-10-CM | POA: Diagnosis present

## 2014-04-15 DIAGNOSIS — D135 Benign neoplasm of extrahepatic bile ducts: Secondary | ICD-10-CM | POA: Diagnosis not present

## 2014-04-15 HISTORY — PX: CHOLECYSTECTOMY: SHX55

## 2014-04-15 SURGERY — LAPAROSCOPIC CHOLECYSTECTOMY WITH INTRAOPERATIVE CHOLANGIOGRAM
Anesthesia: General

## 2014-04-15 MED ORDER — HYDROMORPHONE HCL PF 1 MG/ML IJ SOLN: 0.2500 mg | INTRAMUSCULAR | Status: DC | PRN

## 2014-04-15 MED ORDER — GLYCOPYRROLATE 0.2 MG/ML IJ SOLN
INTRAMUSCULAR | Status: AC
  Filled 2014-04-15: qty 3

## 2014-04-15 MED ORDER — PROPOFOL 10 MG/ML IV BOLUS
INTRAVENOUS | Status: AC
  Filled 2014-04-15: qty 20

## 2014-04-15 MED ORDER — ONDANSETRON HCL 4 MG PO TABS
4.0000 mg | ORAL_TABLET | Freq: Four times a day (QID) | ORAL | Status: DC | PRN
Start: 2014-04-15 — End: 2014-04-15

## 2014-04-15 MED ORDER — DEXAMETHASONE SODIUM PHOSPHATE 10 MG/ML IJ SOLN
INTRAMUSCULAR | Status: AC
  Filled 2014-04-15: qty 1

## 2014-04-15 MED ORDER — IOHEXOL 300 MG/ML  SOLN
INTRAMUSCULAR | Status: DC | PRN
  Administered 2014-04-15: 15 mL

## 2014-04-15 MED ORDER — LIDOCAINE HCL (CARDIAC) 20 MG/ML IV SOLN
INTRAVENOUS | Status: DC | PRN
Start: 2014-04-15 — End: 2014-04-15
  Administered 2014-04-15: 60 mg via INTRAVENOUS

## 2014-04-15 MED ORDER — FENTANYL CITRATE 0.05 MG/ML IJ SOLN
INTRAMUSCULAR | Status: AC
  Filled 2014-04-15: qty 5

## 2014-04-15 MED ORDER — HYDROCODONE-ACETAMINOPHEN 5-325 MG PO TABS
1.0000 | ORAL_TABLET | ORAL | Status: DC | PRN
  Administered 2014-04-15 (×2): 2 via ORAL
  Filled 2014-04-15 (×2): qty 2

## 2014-04-15 MED ORDER — LIDOCAINE HCL (CARDIAC) 20 MG/ML IV SOLN
INTRAVENOUS | Status: AC
  Filled 2014-04-15: qty 5

## 2014-04-15 MED ORDER — ONDANSETRON HCL 4 MG/2ML IJ SOLN
INTRAMUSCULAR | Status: DC | PRN
  Administered 2014-04-15: 4 mg via INTRAVENOUS

## 2014-04-15 MED ORDER — NEBIVOLOL HCL 5 MG PO TABS
5.0000 mg | ORAL_TABLET | Freq: Every day | ORAL | Status: DC
  Administered 2014-04-15: 5 mg via ORAL
  Filled 2014-04-15: qty 1

## 2014-04-15 MED ORDER — ONDANSETRON HCL 4 MG/2ML IJ SOLN: 4.0000 mg | Freq: Four times a day (QID) | INTRAMUSCULAR | Status: DC | PRN

## 2014-04-15 MED ORDER — LACTATED RINGERS IV SOLN
INTRAVENOUS | Status: DC | PRN
  Administered 2014-04-15: 10:00:00 via INTRAVENOUS

## 2014-04-15 MED ORDER — FENTANYL CITRATE 0.05 MG/ML IJ SOLN
INTRAMUSCULAR | Status: DC | PRN
  Administered 2014-04-15: 50 ug via INTRAVENOUS
  Administered 2014-04-15 (×2): 100 ug via INTRAVENOUS

## 2014-04-15 MED ORDER — HYDROMORPHONE HCL PF 1 MG/ML IJ SOLN
1.0000 mg | INTRAMUSCULAR | Status: DC | PRN
  Administered 2014-04-15: 1 mg via INTRAVENOUS
  Filled 2014-04-15: qty 1

## 2014-04-15 MED ORDER — LACTATED RINGERS IV SOLN
INTRAVENOUS | Status: DC
  Administered 2014-04-15: 1000 mL via INTRAVENOUS

## 2014-04-15 MED ORDER — ROCURONIUM BROMIDE 100 MG/10ML IV SOLN
INTRAVENOUS | Status: AC
  Filled 2014-04-15: qty 1

## 2014-04-15 MED ORDER — ROCURONIUM BROMIDE 100 MG/10ML IV SOLN
INTRAVENOUS | Status: DC | PRN
  Administered 2014-04-15: 50 mg via INTRAVENOUS

## 2014-04-15 MED ORDER — PROPOFOL 10 MG/ML IV BOLUS
INTRAVENOUS | Status: DC | PRN
  Administered 2014-04-15: 150 mg via INTRAVENOUS

## 2014-04-15 MED ORDER — LACTATED RINGERS IR SOLN
Status: DC | PRN
  Administered 2014-04-15: 1000 mL

## 2014-04-15 MED ORDER — CIPROFLOXACIN IN D5W 400 MG/200ML IV SOLN
INTRAVENOUS | Status: AC
  Filled 2014-04-15: qty 200

## 2014-04-15 MED ORDER — NEOSTIGMINE METHYLSULFATE 10 MG/10ML IV SOLN
INTRAVENOUS | Status: AC
Start: 2014-04-15 — End: 2014-04-15
  Filled 2014-04-15: qty 1

## 2014-04-15 MED ORDER — MIDAZOLAM HCL 2 MG/2ML IJ SOLN
INTRAMUSCULAR | Status: AC
  Filled 2014-04-15: qty 2

## 2014-04-15 MED ORDER — MIDAZOLAM HCL 5 MG/5ML IJ SOLN
INTRAMUSCULAR | Status: DC | PRN
Start: 2014-04-15 — End: 2014-04-15
  Administered 2014-04-15: 2 mg via INTRAVENOUS

## 2014-04-15 MED ORDER — HYDROCODONE-ACETAMINOPHEN 5-325 MG PO TABS: 1.0000 | ORAL_TABLET | ORAL | Status: DC | PRN

## 2014-04-15 MED ORDER — NEOSTIGMINE METHYLSULFATE 10 MG/10ML IV SOLN
INTRAVENOUS | Status: DC | PRN
  Administered 2014-04-15: 4 mg via INTRAVENOUS

## 2014-04-15 MED ORDER — BUPIVACAINE-EPINEPHRINE 0.5% -1:200000 IJ SOLN
INTRAMUSCULAR | Status: AC
  Filled 2014-04-15: qty 1

## 2014-04-15 MED ORDER — PROMETHAZINE HCL 25 MG/ML IJ SOLN: 6.2500 mg | INTRAMUSCULAR | Status: DC | PRN

## 2014-04-15 MED ORDER — ACETAMINOPHEN 325 MG PO TABS: 650.0000 mg | ORAL_TABLET | ORAL | Status: DC | PRN

## 2014-04-15 MED ORDER — DEXAMETHASONE SODIUM PHOSPHATE 10 MG/ML IJ SOLN
INTRAMUSCULAR | Status: DC | PRN
  Administered 2014-04-15: 10 mg via INTRAVENOUS

## 2014-04-15 MED ORDER — BUPIVACAINE-EPINEPHRINE 0.5% -1:200000 IJ SOLN
INTRAMUSCULAR | Status: DC | PRN
  Administered 2014-04-15: 28 mL

## 2014-04-15 MED ORDER — CARISOPRODOL 350 MG PO TABS: 350.0000 mg | ORAL_TABLET | Freq: Three times a day (TID) | ORAL | Status: DC | PRN

## 2014-04-15 MED ORDER — GLYCOPYRROLATE 0.2 MG/ML IJ SOLN
INTRAMUSCULAR | Status: DC | PRN
  Administered 2014-04-15: 0.6 mg via INTRAVENOUS

## 2014-04-15 MED ORDER — CIPROFLOXACIN IN D5W 400 MG/200ML IV SOLN
400.0000 mg | INTRAVENOUS | Status: AC
  Administered 2014-04-15: 400 mg via INTRAVENOUS

## 2014-04-15 MED ORDER — KCL IN DEXTROSE-NACL 30-5-0.45 MEQ/L-%-% IV SOLN
INTRAVENOUS | Status: DC
  Administered 2014-04-15: 14:00:00 via INTRAVENOUS
  Filled 2014-04-15: qty 1000

## 2014-04-15 MED ORDER — ONDANSETRON HCL 4 MG/2ML IJ SOLN
INTRAMUSCULAR | Status: AC
  Filled 2014-04-15: qty 2

## 2014-04-15 MED ORDER — IRBESARTAN 300 MG PO TABS
300.0000 mg | ORAL_TABLET | Freq: Every day | ORAL | Status: DC
  Administered 2014-04-15: 300 mg via ORAL
  Filled 2014-04-15: qty 1

## 2014-04-15 SURGICAL SUPPLY — 36 items
APL SKNCLS STERI-STRIP NONHPOA (GAUZE/BANDAGES/DRESSINGS) ×1
APPLIER CLIP ROT 10 11.4 M/L (STAPLE) ×2
APR CLP MED LRG 11.4X10 (STAPLE) ×1
BAG SPEC RTRVL LRG 6X4 10 (ENDOMECHANICALS) ×1
BENZOIN TINCTURE PRP APPL 2/3 (GAUZE/BANDAGES/DRESSINGS) ×2 IMPLANT
CABLE HIGH FREQUENCY MONO STRZ (ELECTRODE) ×2 IMPLANT
CANISTER SUCTION 2500CC (MISCELLANEOUS) ×2 IMPLANT
CHLORAPREP W/TINT 26ML (MISCELLANEOUS) ×2 IMPLANT
CLIP APPLIE ROT 10 11.4 M/L (STAPLE) ×1 IMPLANT
COVER MAYO STAND STRL (DRAPES) ×2 IMPLANT
DECANTER SPIKE VIAL GLASS SM (MISCELLANEOUS) ×2 IMPLANT
DRAPE C-ARM 42X120 X-RAY (DRAPES) ×2 IMPLANT
DRAPE LAPAROSCOPIC ABDOMINAL (DRAPES) ×2 IMPLANT
DRAPE UTILITY XL STRL (DRAPES) ×2 IMPLANT
ELECT REM PT RETURN 9FT ADLT (ELECTROSURGICAL) ×2
ELECTRODE REM PT RTRN 9FT ADLT (ELECTROSURGICAL) ×1 IMPLANT
GLOVE SURG ORTHO 8.0 STRL STRW (GLOVE) ×2 IMPLANT
GOWN STRL REUS W/TWL XL LVL3 (GOWN DISPOSABLE) ×4 IMPLANT
HEMOSTAT SURGICEL 4X8 (HEMOSTASIS) IMPLANT
KIT BASIN OR (CUSTOM PROCEDURE TRAY) ×2 IMPLANT
NS IRRIG 1000ML POUR BTL (IV SOLUTION) ×2 IMPLANT
POUCH SPECIMEN RETRIEVAL 10MM (ENDOMECHANICALS) ×2 IMPLANT
SCISSORS LAP 5X35 DISP (ENDOMECHANICALS) ×2 IMPLANT
SET CHOLANGIOGRAPH MIX (MISCELLANEOUS) ×2 IMPLANT
SET IRRIG TUBING LAPAROSCOPIC (IRRIGATION / IRRIGATOR) ×2 IMPLANT
SLEEVE XCEL OPT CAN 5 100 (ENDOMECHANICALS) ×2 IMPLANT
SOLUTION ANTI FOG 6CC (MISCELLANEOUS) ×2 IMPLANT
STRIP CLOSURE SKIN 1/2X4 (GAUZE/BANDAGES/DRESSINGS) ×2 IMPLANT
SUT MNCRL AB 4-0 PS2 18 (SUTURE) ×2 IMPLANT
TOWEL OR 17X26 10 PK STRL BLUE (TOWEL DISPOSABLE) ×2 IMPLANT
TOWEL OR NON WOVEN STRL DISP B (DISPOSABLE) ×2 IMPLANT
TRAY LAP CHOLE (CUSTOM PROCEDURE TRAY) ×2 IMPLANT
TROCAR BLADELESS OPT 5 100 (ENDOMECHANICALS) ×2 IMPLANT
TROCAR XCEL BLUNT TIP 100MML (ENDOMECHANICALS) ×2 IMPLANT
TROCAR XCEL NON-BLD 11X100MML (ENDOMECHANICALS) ×2 IMPLANT
TUBING INSUFFLATION 10FT LAP (TUBING) ×2 IMPLANT

## 2014-04-15 NOTE — H&P (View-Only) (Signed)
General Surgery [facility]  Chief Complaint  Patient presents with  . New Evaluation    abdominal pain - suspect biliary dyskinesia - referral from Dr. Ronald Lobo    HISTORY: Patient is a 68 year old male referred by his gastroenterologist for evaluation of abdominal pain. Patient has a complex history dating back to July of 2013. He developed intermittent epigastric abdominal pain. He noted a mild fatty food intolerance. Evaluation in July 2013 included abdominal ultrasound and a nuclear medicine hepatobiliary scan. Ultrasound was negative for cholelithiasis. Ejection fraction on hepatobiliary scan however was markedly low at 1.1%.  Patient persisted with intermittent abdominal pain. Beginning in May 2015 he had more frequent symptoms associated with "bloating", and diarrhea. Patient has undergone an extensive evaluation including upper endoscopy, abdominal ultrasound, and MRCP. Laboratory studies during a recent episode of pain showed an elevated lipase level at 297. This rapidly returned to normal. Patient is now referred for consideration for cholecystectomy for suspected biliary dyskinesia.  Patient denies any history of jaundice or acholic stools. He denies fevers or chills. Previous abdominal surgery is limited to appendectomy in 1959. There is no family history of hepatobiliary disease.  Past Medical History  Diagnosis Date  . HYPERLIPIDEMIA 01/26/2009  . HYPERTENSION 01/26/2009  . 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 GERD (gastroesophageal reflux disease)     Current Outpatient Prescriptions  Medication Sig Dispense Refill  . carisoprodol (SOMA) 350 MG tablet Take 350 mg by mouth 3 (three) times daily as needed for muscle spasms.       [name] HYDROcodone-acetaminophen (NORCO/VICODIN) 5-325 MG per tablet Take 1 tablet by mouth every 6 (six) hours as needed for moderate pain.      . nebivolol (BYSTOLIC) 5 MG tablet Take 5 mg by mouth daily  after supper.      . olmesartan (BENICAR) 40 MG tablet Take 40 mg by mouth daily after supper.      . hydrochlorothiazide (HYDRODIURIL) 12.5 MG tablet Take 12.5 mg by mouth every morning.       . simethicone (MYLICON) 80 MG chewable tablet Chew 1 tablet (80 mg total) by mouth 4 (four) times daily as needed for flatulence (gas and abdominal distension).  90 tablet  3   No current facility-administered medications for this visit.    Allergies  Allergen Reactions  . Penicillins Itching    Family History  Problem Relation Age of Onset  . Hypertension Mother   . Hypertension Father   . Arthritis Sister     rheumatoid  . Stroke Sister 54    History   Social History  . Marital Status: Married    Spouse Name: N/A    Number of Children: N/A  . Years of Education: N/A   Social History Main Topics  . Smoking status: Former Smoker -- 1.00 packs/day for 5 years    Types: Cigarettes    Quit date: 10/17/1968  . Smokeless tobacco: Never Used  . Alcohol Use: Yes     Comment: "quit drinking ~ 10/1968  . Drug Use: No  . Sexual Activity: None   Other Topics Concern  . None   Social History Narrative  . None    REVIEW OF SYSTEMS - PERTINENT POSITIVES ONLY: Denies jaundice. Denies acholic stools. Denies fevers or chills. No significant weight changes. No prior pancreatic or hepatobiliary disease.  EXAM: Filed Vitals:   04/08/14 1128  BP: 126/80  Pulse: 52  Temp: 98.6 F (37 C)    GENERAL:  well-developed, well-nourished, no acute distress HEENT: normocephalic; pupils equal and reactive; sclerae clear; dentition good; mucous membranes moist NECK:  No palpable masses in the thyroid bed; symmetric on extension; no palpable anterior or posterior cervical lymphadenopathy; no supraclavicular masses; no tenderness CHEST: clear to auscultation bilaterally without rales, rhonchi, or wheezes CARDIAC: regular rate and rhythm without significant murmur; peripheral pulses are  full ABDOMEN: soft without distension; bowel sounds present; no mass; no hepatosplenomegaly; no hernia; well-healed appendectomy incision right lower quadrant EXT:  non-tender without edema; no deformity NEURO: no gross focal deficits; no sign of tremor   LABORATORY RESULTS: See [facility] (CHL-Epic) for most recent results  RADIOLOGY RESULTS: See [facility] (CHL-Epic) for most recent results  IMPRESSION: Intermittent abdominal pain, suspect biliary dyskinesia  PLAN: The patient and I had a lengthy discussion reviewing all of the above information and studies. After careful consideration, I have recommended that he consider laparoscopic cholecystectomy both from a diagnostic and therapeutic standpoint. It has been my experience that approximately 50-60% of patients with this clinical symptomatology benefit from cholecystectomy. Patient and I discussed the procedure at length and I provided him with written literature to review at home. We discussed potential complications including the need for conversion to open surgery. We discussed performing intraoperative cholangiography. We discussed his hospital stay and his postoperative recovery and return to activities and work. Patient understands and wishes to proceed with surgery in the near future.  The risks and benefits of the procedure have been discussed at length with the patient.  The patient understands the proposed procedure, potential alternative treatments, and the course of recovery to be expected.  All of the patient's questions have been answered at this time.  The patient wishes to proceed with surgery.  Earnstine Regal, MD, [facility]  Primary Care Physician: Eulas Post, MD

## 2014-04-15 NOTE — Progress Notes (Signed)
Patient was stable at time of discharge. IV removed. I reviewed discharge education with patient and wife. They verbalized understanding and had no further questions. Patient left with his prescription.

## 2014-04-15 NOTE — Transfer of Care (Signed)
Immediate Anesthesia Transfer of Care Note  Patient: Luis Guzman  Procedure(s) Performed: Procedure(s): LAPAROSCOPIC CHOLECYSTECTOMY WITH INTRAOPERATIVE CHOLANGIOGRAM (N/A)  Patient Location: PACU  Anesthesia Type:General  Level of Consciousness: awake, alert  and oriented  Airway & Oxygen Therapy: Patient Spontanous Breathing and Patient connected to face mask oxygen  Post-op Assessment: Report given to PACU RN and Post -op Vital signs reviewed and stable  Post vital signs: Reviewed and stable  Complications: No apparent anesthesia complications

## 2014-04-15 NOTE — Anesthesia Preprocedure Evaluation (Addendum)
Anesthesia Evaluation  Patient identified by MRN, date of birth, ID band Patient awake    Reviewed: Allergy & Precautions, H&P , NPO status , Patient's Chart, lab work & pertinent test results  Airway Mallampati: II TM Distance: >3 FB Neck ROM: Full    Dental no notable dental hx.    Pulmonary neg pulmonary ROS, former smoker,  breath sounds clear to auscultation  Pulmonary exam normal       Cardiovascular hypertension, Pt. on medications and Pt. on home beta blockers Rhythm:Regular Rate:Normal     Neuro/Psych negative neurological ROS  negative psych ROS   GI/Hepatic Neg liver ROS,   Endo/Other  diabetes, Type 2  Renal/GU negative Renal ROS  negative genitourinary   Musculoskeletal negative musculoskeletal ROS (+)   Abdominal   Peds negative pediatric ROS (+)  Hematology negative hematology ROS (+)   Anesthesia Other Findings   Reproductive/Obstetrics negative OB ROS                          Anesthesia Physical Anesthesia Plan  ASA: II  Anesthesia Plan: General   Post-op Pain Management:    Induction: Intravenous  Airway Management Planned:   Additional Equipment:   Intra-op Plan:   Post-operative Plan: Extubation in OR  Informed Consent: I have reviewed the patients History and Physical, chart, labs and discussed the procedure including the risks, benefits and alternatives for the proposed anesthesia with the patient or authorized representative who has indicated his/her understanding and acceptance.   Dental advisory given  Plan Discussed with: CRNA and Surgeon  Anesthesia Plan Comments:         Anesthesia Quick Evaluation

## 2014-04-15 NOTE — Anesthesia Postprocedure Evaluation (Signed)
  Anesthesia Post-op Note  Patient: Luis Guzman  Procedure(s) Performed: Procedure(s) (LRB): LAPAROSCOPIC CHOLECYSTECTOMY WITH INTRAOPERATIVE CHOLANGIOGRAM (N/A)  Patient Location: PACU  Anesthesia Type: General  Level of Consciousness: awake and alert   Airway and Oxygen Therapy: Patient Spontanous Breathing  Post-op Pain: mild  Post-op Assessment: Post-op Vital signs reviewed, Patient's Cardiovascular Status Stable, Respiratory Function Stable, Patent Airway and No signs of Nausea or vomiting  Last Vitals:  Filed Vitals:   04/15/14 1200  BP:   Pulse:   Temp: 36.5 C  Resp:     Post-op Vital Signs: stable   Complications: No apparent anesthesia complications

## 2014-04-15 NOTE — Op Note (Signed)
Procedure Note  Pre-operative Diagnosis:  Biliary dyskinesia, abdominal pain  Post-operative Diagnosis:  same  Surgeon:  Earnstine Regal, MD, FACS  Assistant:  none   Procedure:  Laparoscopic cholecystectomy with intra-operative cholangiography  Anesthesia:  General  Estimated Blood Loss:  minimal  Drains: none         Specimen: Gallbladder to pathology  Indications:  Patient is a 68 year old male referred by his gastroenterologist for evaluation of abdominal pain. Patient has a complex history dating back to July of 2013. He developed intermittent epigastric abdominal pain. He noted a mild fatty food intolerance. Evaluation in July 2013 included abdominal ultrasound and a nuclear medicine hepatobiliary scan. Ultrasound was negative for cholelithiasis. Ejection fraction on hepatobiliary scan however was markedly low at 1.1%. Patient persisted with intermittent abdominal pain. Beginning in May 2015 he had more frequent symptoms associated with "bloating", and diarrhea. Patient has undergone an extensive evaluation including upper endoscopy, abdominal ultrasound, and MRCP. Laboratory studies during a recent episode of pain showed an elevated lipase level at 297. This rapidly returned to normal. Patient is now referred for consideration for cholecystectomy for suspected biliary dyskinesia. Patient denies any history of jaundice or acholic stools. He denies fevers or chills. Previous abdominal surgery is limited to appendectomy in 1959. There is no family history of hepatobiliary disease.  Procedure Details:  The patient was seen in the pre-op holding area. The risks, benefits, complications, treatment options, and expected outcomes were previously discussed with the patient. The patient agreed with the proposed plan and has signed the informed consent form.  The patient was brought to the Operating Room, identified as Luis Guzman and the procedure verified as laparoscopic cholecystectomy with  intraoperative cholangiography. A "time out" was completed and the above information confirmed.  The patient was placed in the supine position. Following induction of general anesthesia, the abdomen was prepped and draped in the usual aseptic fashion.  An incision was made in the skin near the umbilicus. The midline fascia was incised and the peritoneal cavity was entered and a Hasson canula was introduced under direct vision.  The Hasson canula was secured with a 0-Vicryl pursestring suture. Pneumoperitoneum was established with carbon dioxide. Additional trocars were introduced under direct vision along the right costal margin in the midline, mid-clavicular line, and anterior axillary line.   The gallbladder was identified and the fundus grasped and retracted cephalad. Adhesions were taken down bluntly and the electrocautery was utilized as needed, taking care not to injure any adjacent structures. The infundibulum was grasped and retracted laterally, exposing the peritoneum overlying the triangle of Calot. The peritoneum was incised and structures exposed with blunt dissection. The cystic duct was clearly identified, bluntly dissected circumferentially, and clipped at the neck of the gallbladder.  An incision was made in the cystic duct and the cholangiogram catheter introduced. The catheter was secured using an ligaclip.  Real-time cholangiography was performed using C-arm fluoroscopy.  There was rapid filling of a normal caliber common bile duct.  There was reflux of contrast into the left and right hepatic ductal systems.  There was free flow distally into the duodenum without filling defect or obstruction.  The catheter was removed from the peritoneal cavity.  The cystic duct was then ligated with surgical clips and divided. The cystic artery was identified, dissected circumferentially, ligated with ligaclips, and divided.  The gallbladder was dissected away from the liver bed using the  electrocautery for hemostasis. The gallbladder was completely removed from the liver and  placed into an endocatch bag. The gallbladder was removed in the endocatch bag through the umbilical port site and submitted to pathology for review.  The right upper quadrant was irrigated and the gallbladder bed was inspected. Hemostasis was achieved with the electrocautery.  Pneumoperitoneum was released after viewing removal of the trocars with good hemostasis noted. The umbilical wound was irrigated and the fascia was then closed with the pursestring suture.  Local anesthetic was infiltrated at all port sites. The skin incisions were closed with 4-0 Monocril subcuticular sutures and steri-strips and dressings were applied.  Instrument, sponge, and needle counts were correct at the conclusion of the case.  The patient was awakened from anesthesia and brought to the recovery room in stable condition.  The patient tolerated the procedure well.   Earnstine Regal, MD, [facility] Office: [phone]

## 2014-04-15 NOTE — Interval H&P Note (Signed)
History and Physical Interval Note:  04/15/2014 9:26 AM  Luis Guzman  has presented today for surgery, with the diagnosis of Lap Chole with IOC. The various methods of treatment have been discussed with the patient and family. After consideration of risks, benefits and other options for treatment, the patient has consented to    Procedure(s): LAPAROSCOPIC CHOLECYSTECTOMY WITH INTRAOPERATIVE CHOLANGIOGRAM (N/A) as a surgical intervention .    The patient's history has been reviewed, patient examined, no change in status, stable for surgery.  I have reviewed the patient's chart and labs.  Questions were answered to the patient's satisfaction.    Earnstine Regal, MD, [facility] Office: [place]

## 2014-04-15 NOTE — Discharge Instructions (Signed)
  [facility]  LAPAROSCOPIC SURGERY - POST-OP INSTRUCTIONS  Always review your discharge instruction sheet given to you by the facility where your surgery was performed.  A prescription for pain medication may be given to you upon discharge.  Take your pain medication as prescribed.  If narcotic pain medicine is not needed, then you may take acetaminophen (Tylenol) or ibuprofen (Advil) as needed.  Take your usually prescribed medications unless otherwise directed.  If you need a refill on your pain medication, please contact your pharmacy.  They will contact our office to request authorization. Prescriptions will not be filled after 5 P.M. or on weekends.  You should follow a light diet the first few days after arrival home, such as soup and crackers or toast.  Be sure to include plenty of fluids daily.  Most patients will experience some swelling and bruising in the area of the incisions.  Ice packs will help.  Swelling and bruising can take several days to resolve.   It is common to experience some constipation if taking pain medication after surgery.  Increasing fluid intake and taking a stool softener (such as Colace) will usually help or prevent this problem from occurring.  A mild laxative (Milk of Magnesia or Miralax) should be taken according to package instructions if there are no bowel movements after 48 hours.  Unless discharge instructions indicate otherwise, you may remove your bandages 24-48 hours after surgery, and you may shower at that time.  You may have steri-strips (small skin tapes) in place directly over the incision.  These strips should be left on the skin for 7-10 days.  If your surgeon used skin glue on the incision, you may shower in 24 hours.  The glue will flake off over the next 2-3 weeks.  Any sutures or staples will be removed at the office during your follow-up visit.  ACTIVITIES:  You may resume regular (light) daily activities beginning the  next day-such as daily self-care, walking, climbing stairs-gradually increasing activities as tolerated.  You may have sexual intercourse when it is comfortable.  Refrain from any heavy lifting or straining until approved by your doctor.  You may drive when you are no longer taking prescription pain medication, you can comfortably wear a seatbelt, and you can safely maneuver your car and apply brakes.  You should see your doctor in the office for a follow-up appointment approximately 2-3 weeks after your surgery.  Make sure that you call for this appointment within a day or two after you arrive home to insure a convenient appointment time.  WHEN TO CALL YOUR DOCTOR: 1. Fever over 101.0 2. Inability to urinate 3. Continued bleeding from incision 4. Increased pain, redness, or drainage from the incision 5. Increasing abdominal pain  The clinic staff is available to answer your questions during regular business hours.  Please don't hesitate to call and ask to speak to one of the nurses for clinical concerns.  If you have a medical emergency, go to the nearest emergency room or call 911.  A surgeon from [facility] is always on call for the hospital.  Maneh Sieben M. Domonic Hiscox, MD, FACS [facility] Office: [phone] Toll Free:  [phone] FAX [phone]  Web site: [email or web address] 

## 2014-04-15 NOTE — Progress Notes (Signed)
Patient ID: Luis Guzman, male   DOB: 01/05/1946, 68 y.o.   MRN: [id number]  [facility]  Patient seen and examined.  Tolerating clear liquids.  Requiring IV and oral pain Rx.  Encouraged to ambulate.  Will advance to full liquids for supper.  Patient may want to go home after supper tonight.  Will monitor.  Earnstine Regal, MD, [facility] Office: [phone]

## 2014-04-16 ENCOUNTER — Ambulatory Visit (HOSPITAL_COMMUNITY): Admission: RE | Admit: 2014-04-16 | Payer: Medicare Other | Source: Ambulatory Visit | Admitting: Gastroenterology

## 2014-04-16 ENCOUNTER — Encounter (HOSPITAL_COMMUNITY): Payer: Self-pay | Admitting: Surgery

## 2014-04-16 ENCOUNTER — Telehealth: Payer: Self-pay

## 2014-04-16 HISTORY — DX: Gastro-esophageal reflux disease without esophagitis: K21.9

## 2014-04-16 SURGERY — ESOPHAGEAL ENDOSCOPIC ULTRASOUND (EUS) RADIAL
Anesthesia: Monitor Anesthesia Care

## 2014-04-16 MED ORDER — PITAVASTATIN CALCIUM 2 MG PO TABS: ORAL_TABLET | ORAL | Status: DC

## 2014-04-16 MED ORDER — HYDROCHLOROTHIAZIDE 12.5 MG PO TABS
12.5000 mg | ORAL_TABLET | Freq: Every morning | ORAL | Status: DC
Start: 2014-04-16 — End: 2015-04-16

## 2014-04-16 MED ORDER — NEBIVOLOL HCL 5 MG PO TABS: 5.0000 mg | ORAL_TABLET | Freq: Every day | ORAL | Status: DC

## 2014-04-16 MED ORDER — METOPROLOL SUCCINATE ER 50 MG PO TB24: 50.0000 mg | ORAL_TABLET | Freq: Every day | ORAL | Status: DC

## 2014-04-16 MED ORDER — LOSARTAN POTASSIUM 100 MG PO TABS: 100.0000 mg | ORAL_TABLET | Freq: Every day | ORAL | Status: DC

## 2014-04-16 NOTE — Telephone Encounter (Signed)
He will STOP Benicar and Bystolic Start Losartan 150 mg once daily and Toprol XL 50 mg once daily  Recommend office follow up within 1-2 months after making this change.

## 2014-04-16 NOTE — Telephone Encounter (Signed)
Pt stated that he had his surgery yesterday. Pt stated that you were going to change his B/P medication and Cholesterol medication?   Send to [facility]

## 2014-04-16 NOTE — Telephone Encounter (Signed)
Pt informed and RX sent to pharmacy and mail order.

## 2014-04-16 NOTE — Discharge Summary (Signed)
  Physician Discharge Summary [facility]  Patient ID: Luis Guzman MRN: [id number] DOB/AGE: [birth year] 68 y.o.  Admit date: 04/15/2014 Discharge date: 04/15/2014  Admission Diagnoses:  Biliary dyskinesia, abdominal pain  Discharge Diagnoses:  Principal Problem:   Biliary dyskinesia Active Problems:   Abdominal pain, epigastric   Discharged Condition: good  Hospital Course: patient admitted for observation after lap chole.  Tolerated diet.  Ambulatory.  Pain well controlled.  Patient desired discharge home with family on evening following surgery.  Consults: None  Treatments: surgery: lap chole with IOC  Discharge Exam: Blood pressure 116/54, pulse 53, temperature 97.7 F (36.5 C), temperature source Oral, resp. rate 16, height 6\' 1"  (1.854 m), weight 196 lb (88.905 kg), SpO2 100.00%.  Disposition: Home     Medication List    ASK your doctor about these medications       carisoprodol 350 MG tablet  Commonly known as:  SOMA  Take 1 tablet (350 mg total) by mouth 3 (three) times daily as needed for muscle spasms.     hydrochlorothiazide 12.5 MG tablet  Commonly known as:  HYDRODIURIL  Take 12.5 mg by mouth every morning.     HYDROcodone-acetaminophen 5-325 MG per tablet  Commonly known as:  NORCO/VICODIN  Take 1 tablet by mouth every 6 (six) hours as needed for moderate pain.  Ask about: Which instructions should I use?     HYDROcodone-acetaminophen 5-325 MG per tablet  Commonly known as:  NORCO/VICODIN  Take 1-2 tablets by mouth every 4 (four) hours as needed for moderate pain.  Ask about: Which instructions should I use?     nebivolol 5 MG tablet  Commonly known as:  BYSTOLIC  Take 5 mg by mouth daily after supper.     olmesartan 40 MG tablet  Commonly known as:  BENICAR  Take 40 mg by mouth daily after supper.     oxyCODONE-acetaminophen 10-650 MG per tablet  Commonly known as:  PERCOCET  Take 1 tablet by mouth every 6 (six) hours as  needed for pain.     simethicone 80 MG chewable tablet  Commonly known as:  MYLICON  Chew 1 tablet (80 mg total) by mouth 4 (four) times daily as needed for flatulence (gas and abdominal distension).           Follow-up Information   Follow up with Earnstine Regal, MD In 3 weeks. (For wound re-check)    Specialty:  General Surgery   Contact information:   [address] [place] [address] [phone]       Earnstine Regal, MD, [facility] Office: [phone]   Signed: Earnstine Regal 04/16/2014, 7:47 AM

## 2014-04-22 ENCOUNTER — Other Ambulatory Visit: Payer: Self-pay

## 2014-04-22 MED ORDER — METOPROLOL SUCCINATE ER 50 MG PO TB24: 50.0000 mg | ORAL_TABLET | Freq: Every day | ORAL | Status: DC

## 2014-04-22 MED ORDER — PITAVASTATIN CALCIUM 2 MG PO TABS: ORAL_TABLET | ORAL | Status: DC

## 2014-04-28 ENCOUNTER — Telehealth: Payer: Self-pay | Admitting: Family Medicine

## 2014-04-28 NOTE — Telephone Encounter (Signed)
Pharm states they have updated pt rx oxyCODONE-acetaminophen (PERCOCET) 10-650 MG per tablet To 325 mg.  They no longer make the 650 mg and want you to update records.

## 2014-04-28 NOTE — Telephone Encounter (Signed)
Updated med list

## 2014-05-05 ENCOUNTER — Telehealth: Payer: Self-pay

## 2014-05-05 ENCOUNTER — Ambulatory Visit (INDEPENDENT_AMBULATORY_CARE_PROVIDER_SITE_OTHER): Payer: Medicare Other | Admitting: Family Medicine

## 2014-05-05 DIAGNOSIS — Z23 Encounter for immunization: Secondary | ICD-10-CM

## 2014-05-05 NOTE — Telephone Encounter (Signed)
I believe he has already been intolerant of most or all of the generics in the past.

## 2014-05-05 NOTE — Telephone Encounter (Signed)
Patient stated that the livalo prescription is to expensive. Is there another RX that he can take.

## 2014-05-06 NOTE — Telephone Encounter (Signed)
Left detailed message on patient VM that i have coupons for pick up.

## 2014-05-26 DIAGNOSIS — M5412 Radiculopathy, cervical region: Secondary | ICD-10-CM | POA: Diagnosis not present

## 2014-05-26 DIAGNOSIS — M4722 Other spondylosis with radiculopathy, cervical region: Secondary | ICD-10-CM | POA: Diagnosis not present

## 2014-06-14 ENCOUNTER — Other Ambulatory Visit: Payer: Self-pay | Admitting: Family Medicine

## 2014-06-24 ENCOUNTER — Telehealth: Payer: Self-pay

## 2014-06-24 NOTE — Telephone Encounter (Signed)
Carisoprodal will not be covered in 2016. Alt Drugs: Meloxicam, Diclofenac, Ibuprofen, Naproxen, tizanidine.

## 2014-06-25 MED ORDER — TIZANIDINE HCL 4 MG PO TABS: 4.0000 mg | ORAL_TABLET | Freq: Three times a day (TID) | ORAL | Status: DC | PRN

## 2014-06-25 NOTE — Telephone Encounter (Signed)
Pt aware and Rx sent to pharmacy

## 2014-06-25 NOTE — Telephone Encounter (Signed)
Most of the listed drugs are NOT in the same class!  Where did this list of alternatives come from?  Tizanidine is a muscle relaxer and could be used.  He only takes this prn Zanaflex 4 mg every 8 hours prn muscle spasm #30 0 refills.  This can be sedating.

## 2014-06-25 NOTE — Telephone Encounter (Signed)
Left message for patient to return call.

## 2014-06-26 IMAGING — US US ABDOMEN COMPLETE
1 series · 14 of 25 positions shown · non-contrast
Comparison: None.

CLINICAL DATA: Abdominal pain.

COMPLETE ABDOMINAL ULTRASOUND

[Series 1: us abdomen complete · 0.31mm/px · 14 of 75 slices shown]
[im 1/75]
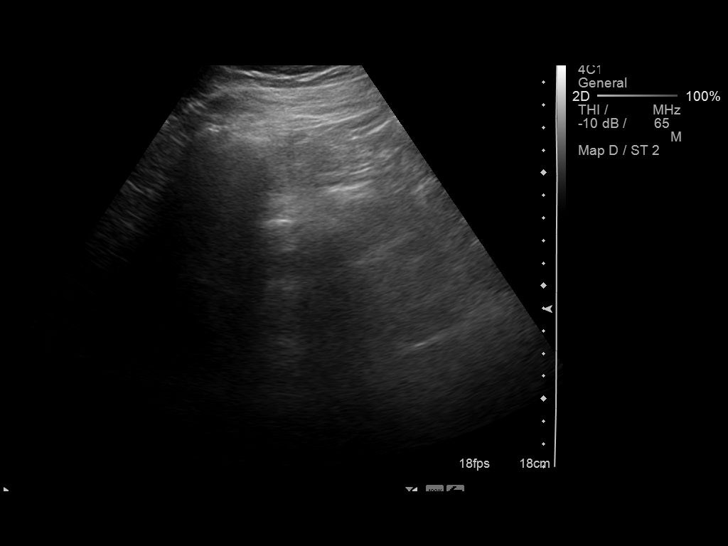
[im 7/75]
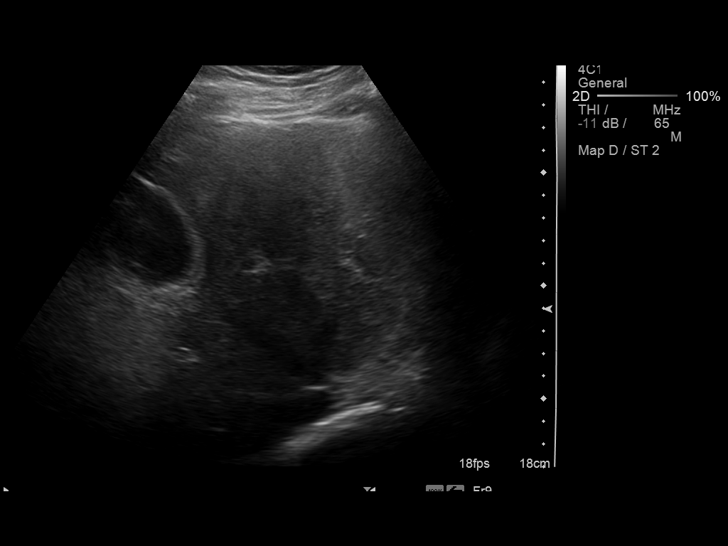
[im 13/75]
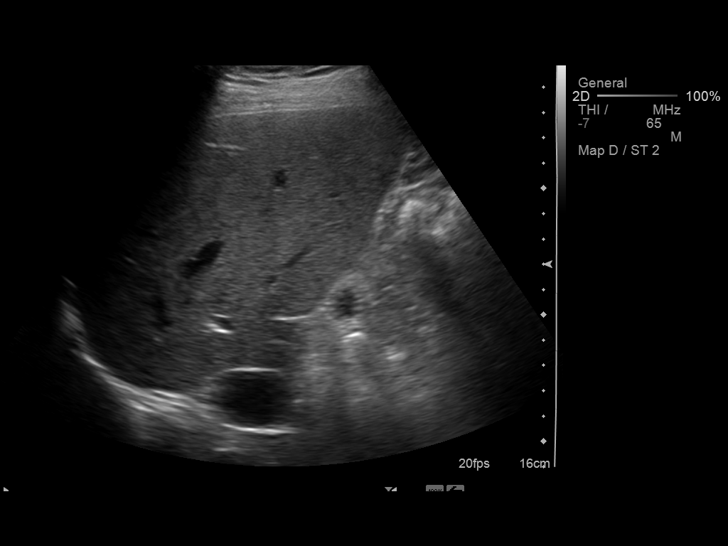
[im 19/75]
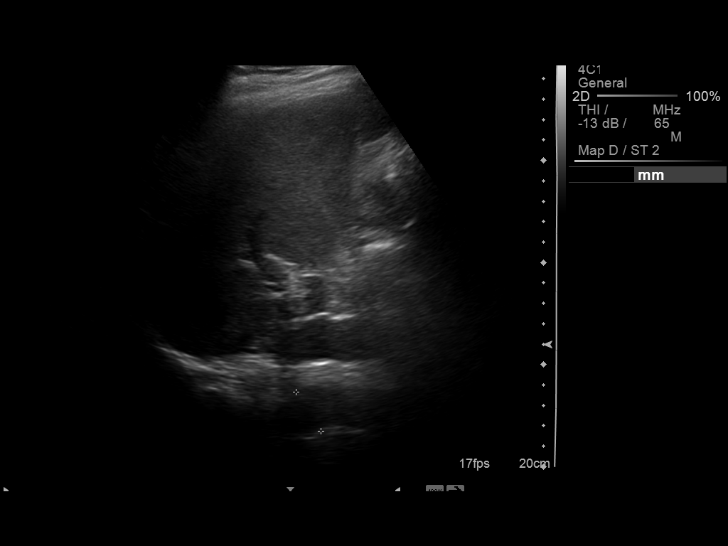
[im 25/75]
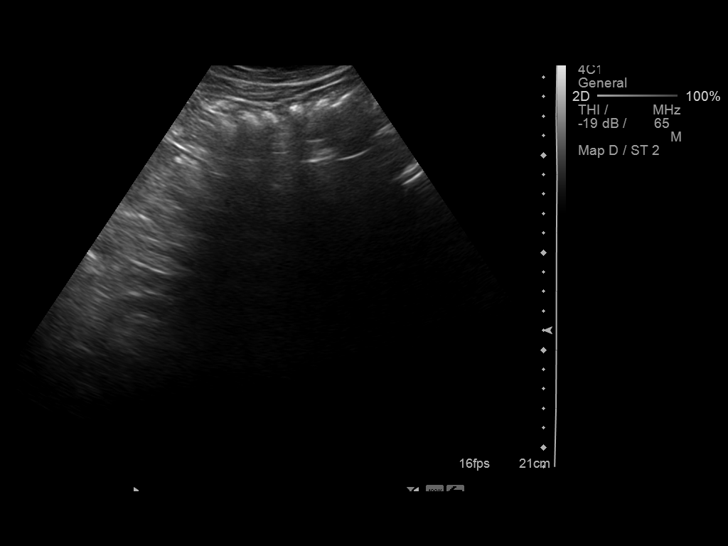
[im 28/75]
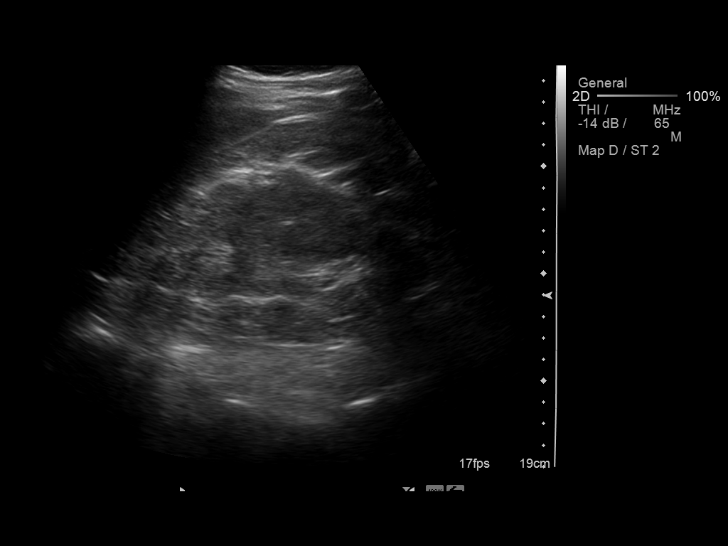
[im 34/75]
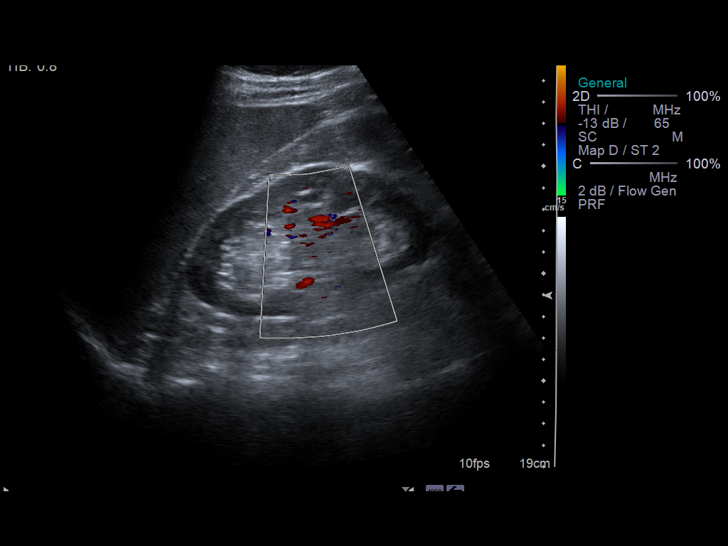
[im 41/75]
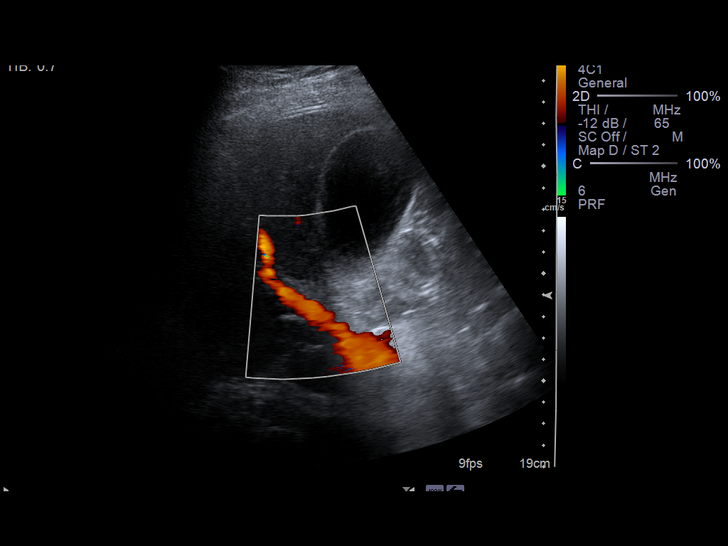
[im 47/75]
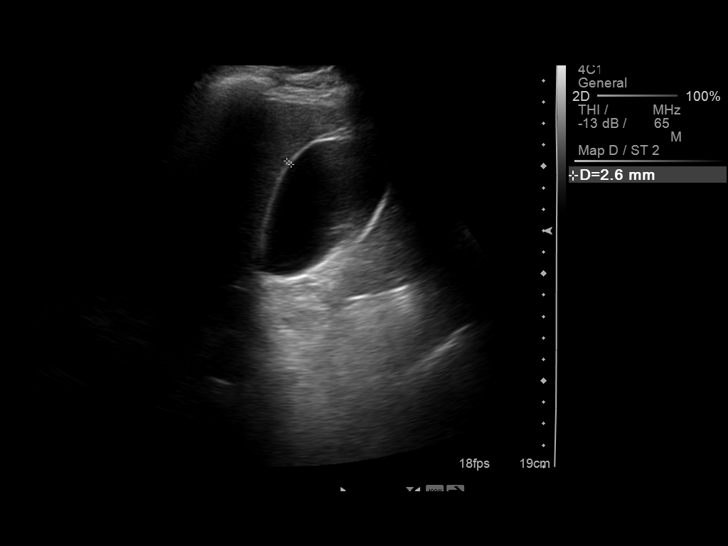
[im 50/75]
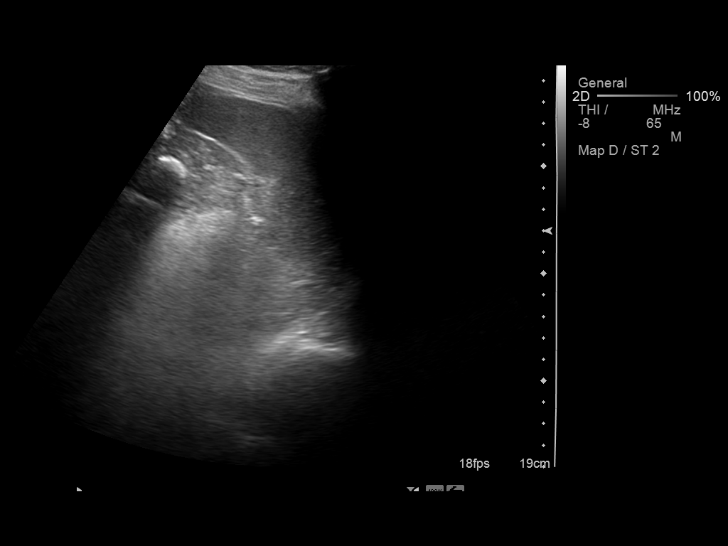
[im 56/75]
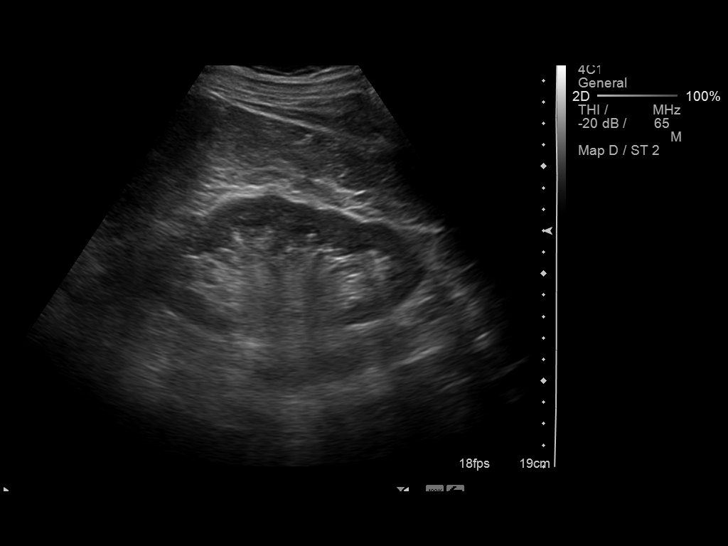
[im 62/75]
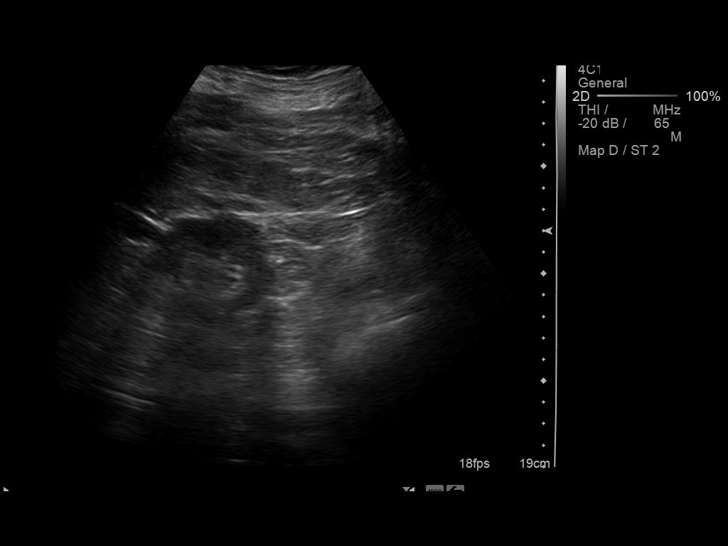
[im 68/75]
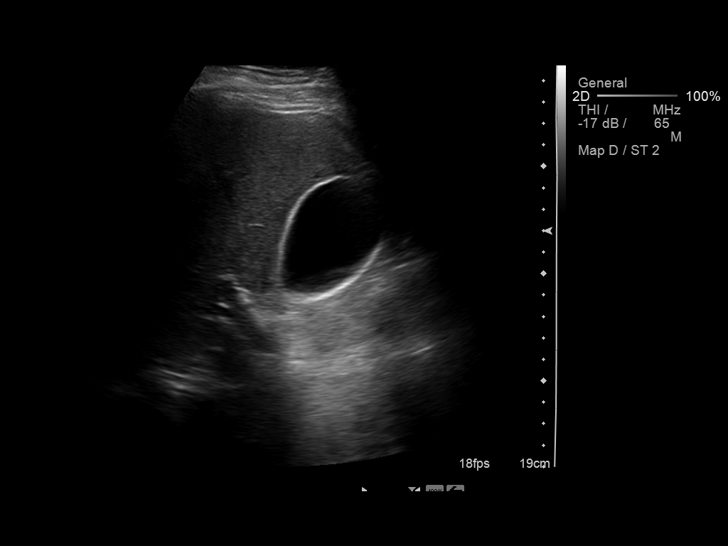
[im 75/75]
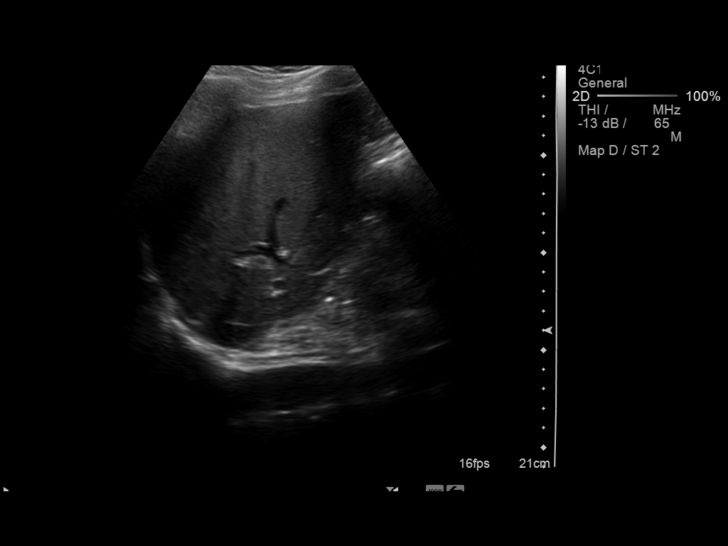

[14 of 25 positions shown; findings below may reference images not displayed]

FINDINGS: Gallbladder:  Normal appearance of the gallbladder.  No stones,
sludge, pericholecystic fluid or wall thickening.  Gallbladder is
adequately distended.

Common bile duct:  Normal at 3 mm.

Liver:  No mass lesions.  Normal echotexture.  No intrahepatic
biliary ductal dilation.  Hypoplastic left hepatic lobe
incidentally noted.

IVC:  Appears normal.

Pancreas:  Obscured by bowel gas.

Spleen:  6.8 cm.  Normal echotexture.

Right Kidney:  12.2 cm. Normal echotexture.  Normal central sinus
echo complex.  No calculi or hydronephrosis.

Left Kidney:  13.1 cm.  13 mm x 19 mm x 12 mm left inferior pole
simple renal cyst.

Abdominal aorta:  No aneurysm identified.
IMPRESSION: 1.  Negative for cholecystitis or cholelithiasis.
2.  Simple left inferior pole renal cyst.

## 2014-08-11 ENCOUNTER — Telehealth: Payer: Self-pay | Admitting: Family Medicine

## 2014-08-11 NOTE — Telephone Encounter (Signed)
This depends on his tolerance level.  If his pain is relatively mild, I would suggest staying on the statin.

## 2014-08-11 NOTE — Telephone Encounter (Signed)
Pt has been taking chole(livalo) med for last 3 months and now has joint pains . Please advise local pharm [facility]

## 2014-08-11 NOTE — Telephone Encounter (Signed)
I don't think we have any other statin options since he has had intolerance with basically all the rest.

## 2014-08-11 NOTE — Telephone Encounter (Signed)
Pt informed. Pt wants to know do you want him to continue to take the medication and be in pain. Or stop taking the medication and his cholesterol go up.

## 2014-08-12 NOTE — Telephone Encounter (Signed)
STOP the statin if his pain is that severe.

## 2014-08-12 NOTE — Telephone Encounter (Signed)
Left message for patient to return call.

## 2014-08-12 NOTE — Telephone Encounter (Signed)
Pain is not mild. Is having pain even when he is just siting and when he tries to get up. Taking 4 aleve does not relieve the pain.

## 2014-08-13 NOTE — Telephone Encounter (Signed)
Patient is aware 

## 2014-09-02 ENCOUNTER — Ambulatory Visit (INDEPENDENT_AMBULATORY_CARE_PROVIDER_SITE_OTHER): Payer: Medicare Other | Admitting: Family Medicine

## 2014-09-02 ENCOUNTER — Encounter: Payer: Self-pay | Admitting: Family Medicine

## 2014-09-02 VITALS — BP 140/84 | HR 62 | Temp 98.0°F | Wt 211.2 lb

## 2014-09-02 DIAGNOSIS — J069 Acute upper respiratory infection, unspecified: Secondary | ICD-10-CM

## 2014-09-02 MED ORDER — HYDROCODONE-HOMATROPINE 5-1.5 MG/5ML PO SYRP: 5.0000 mL | ORAL_SOLUTION | Freq: Four times a day (QID) | ORAL | Status: AC | PRN

## 2014-09-02 MED ORDER — AZITHROMYCIN 250 MG PO TABS: ORAL_TABLET | ORAL | Status: AC

## 2014-09-02 NOTE — Patient Instructions (Signed)

## 2014-09-02 NOTE — Progress Notes (Signed)
   Subjective:    Patient ID: Luis Guzman, male    DOB: 09/16/1945, 69 y.o.   MRN: [id number]  HPI Patient seen with one-day history of cough, sinus congestion, mild body aches, and malaise. Cough productive of thick green-yellow sputum. Has not had any fever. No nausea or vomiting. Increased bilateral frontal sinus pressure. He is concerned because he has upcoming business trip and has had severe cough in the past. Nonsmoker.  Past Medical History  Diagnosis Date  . HYPERLIPIDEMIA 01/26/2009  . HYPERTENSION 01/26/2009  . GERD (gastroesophageal reflux disease)   . Transfusion history     infant "anemia"  . 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 lower back pain, s/p cervical neck surgery"stiffness" remains   Past Surgical History  Procedure Laterality Date  . Cervical laminectomy  1997    C 4 and C 5  . Shoulder arthroscopy w/ rotator cuff repair Bilateral 2004-2009    left 2004, right 2009  . Orif fibula fracture Left 09/2008    compartment syndrome  . Appendectomy  ~ 1959  . Esophagogastroduodenoscopy N/A 03/13/2014    Procedure: ESOPHAGOGASTRODUODENOSCOPY (EGD);  Surgeon: Wonda Horner, MD;  Location: [facility] ENDOSCOPY;  Service: Endoscopy;  Laterality: N/A;  . Back surgery    . Vasectomy    . Cholecystectomy N/A 04/15/2014    Procedure: LAPAROSCOPIC CHOLECYSTECTOMY WITH INTRAOPERATIVE CHOLANGIOGRAM;  Surgeon: Armandina Gemma, MD;  Location: [facility] ORS;  Service: General;  Laterality: N/A;    reports that he quit smoking about 45 years ago. His smoking use included Cigarettes. He has a 5 pack-year smoking history. He has never used smokeless tobacco. He reports that he drinks alcohol. He reports that he does not use illicit drugs. family history includes Arthritis in his sister; Hypertension in his father and mother; Stroke (age of onset: 3) in his sister. Allergies  Allergen Reactions  . Penicillins Itching      Review of Systems  Constitutional: Positive for  fatigue. Negative for fever and chills.  HENT: Positive for congestion and sinus pressure. Negative for ear pain.   Respiratory: Positive for cough. Negative for shortness of breath and wheezing.   Cardiovascular: Negative for chest pain.       Objective:   Physical Exam  Constitutional: He appears well-developed and well-nourished. No distress.  HENT:  Right Ear: External ear normal.  Left Ear: External ear normal.  Mouth/Throat: Oropharynx is clear and moist.  Neck: Neck supple.  Cardiovascular: Normal rate and regular rhythm.   Pulmonary/Chest: Effort normal and breath sounds normal. No respiratory distress. He has no wheezes. He has no rales.  Lymphadenopathy:    He has no cervical adenopathy.          Assessment & Plan:  Acute upper respiratory infection with cough. We explained this is probably viral. Hycodan cough syrup 1 teaspoon daily at bedtime for severe cough. No antibiotics recommended at this point but written prescription for Zithromax if he has progressive symptoms

## 2014-10-16 DIAGNOSIS — M5412 Radiculopathy, cervical region: Secondary | ICD-10-CM | POA: Diagnosis not present

## 2014-10-16 DIAGNOSIS — M542 Cervicalgia: Secondary | ICD-10-CM | POA: Diagnosis not present

## 2014-11-14 DIAGNOSIS — M5412 Radiculopathy, cervical region: Secondary | ICD-10-CM | POA: Diagnosis not present

## 2014-11-14 DIAGNOSIS — M4722 Other spondylosis with radiculopathy, cervical region: Secondary | ICD-10-CM | POA: Diagnosis not present

## 2014-11-14 DIAGNOSIS — M542 Cervicalgia: Secondary | ICD-10-CM | POA: Diagnosis not present

## 2014-12-29 DIAGNOSIS — M5412 Radiculopathy, cervical region: Secondary | ICD-10-CM | POA: Diagnosis not present

## 2014-12-29 DIAGNOSIS — M4722 Other spondylosis with radiculopathy, cervical region: Secondary | ICD-10-CM | POA: Diagnosis not present

## 2015-01-17 ENCOUNTER — Other Ambulatory Visit: Payer: Self-pay | Admitting: Family Medicine

## 2015-02-09 ENCOUNTER — Other Ambulatory Visit: Payer: Self-pay

## 2015-02-09 DIAGNOSIS — M5412 Radiculopathy, cervical region: Secondary | ICD-10-CM | POA: Diagnosis not present

## 2015-02-09 DIAGNOSIS — I10 Essential (primary) hypertension: Secondary | ICD-10-CM | POA: Diagnosis not present

## 2015-02-09 DIAGNOSIS — M4722 Other spondylosis with radiculopathy, cervical region: Secondary | ICD-10-CM | POA: Diagnosis not present

## 2015-02-09 DIAGNOSIS — Z6826 Body mass index (BMI) 26.0-26.9, adult: Secondary | ICD-10-CM | POA: Diagnosis not present

## 2015-02-09 DIAGNOSIS — M542 Cervicalgia: Secondary | ICD-10-CM | POA: Diagnosis not present

## 2015-04-16 ENCOUNTER — Ambulatory Visit (INDEPENDENT_AMBULATORY_CARE_PROVIDER_SITE_OTHER): Payer: Medicare Other | Admitting: Family Medicine

## 2015-04-16 ENCOUNTER — Encounter: Payer: Self-pay | Admitting: Family Medicine

## 2015-04-16 VITALS — BP 120/80 | HR 66 | Temp 98.5°F | Wt 197.0 lb

## 2015-04-16 DIAGNOSIS — N400 Enlarged prostate without lower urinary tract symptoms: Secondary | ICD-10-CM

## 2015-04-16 DIAGNOSIS — E119 Type 2 diabetes mellitus without complications: Secondary | ICD-10-CM

## 2015-04-16 DIAGNOSIS — E785 Hyperlipidemia, unspecified: Secondary | ICD-10-CM

## 2015-04-16 DIAGNOSIS — Z23 Encounter for immunization: Secondary | ICD-10-CM

## 2015-04-16 DIAGNOSIS — Z125 Encounter for screening for malignant neoplasm of prostate: Secondary | ICD-10-CM | POA: Diagnosis not present

## 2015-04-16 DIAGNOSIS — Z0000 Encounter for general adult medical examination without abnormal findings: Secondary | ICD-10-CM | POA: Diagnosis not present

## 2015-04-16 DIAGNOSIS — Z8042 Family history of malignant neoplasm of prostate: Secondary | ICD-10-CM

## 2015-04-16 DIAGNOSIS — I10 Essential (primary) hypertension: Secondary | ICD-10-CM

## 2015-04-16 LAB — HEPATIC FUNCTION PANEL
ALT: 15 U/L (ref 0–53)
AST: 19 U/L (ref 0–37)
Albumin: 4.4 g/dL (ref 3.5–5.2)
Alkaline Phosphatase: 97 U/L (ref 39–117)
Bilirubin, Direct: 0.1 mg/dL (ref 0.0–0.3)
Total Bilirubin: 0.5 mg/dL (ref 0.2–1.2)
Total Protein: 7 g/dL (ref 6.0–8.3)

## 2015-04-16 LAB — BASIC METABOLIC PANEL
BUN: 15 mg/dL (ref 6–23)
CO2: 28 mEq/L (ref 19–32)
Calcium: 9.6 mg/dL (ref 8.4–10.5)
Chloride: 106 mEq/L (ref 96–112)
Creatinine, Ser: 0.87 mg/dL (ref 0.40–1.50)
GFR: 92.46 mL/min (ref >60.00)
Glucose, Bld: 93 mg/dL (ref 70–99)
Potassium: 3.4 mEq/L — ABNORMAL LOW (ref 3.5–5.1)
Sodium: 144 mEq/L (ref 135–145)

## 2015-04-16 LAB — LIPID PANEL
Cholesterol: 258 mg/dL — ABNORMAL HIGH (ref 0–200)
HDL: 35.3 mg/dL — ABNORMAL LOW (ref >39.00)
NonHDL: 222.53
Total CHOL/HDL Ratio: 7
Triglycerides: 373 mg/dL — ABNORMAL HIGH (ref 0.0–149.0)
VLDL: 74.6 mg/dL — ABNORMAL HIGH (ref 0.0–40.0)

## 2015-04-16 LAB — HEMOGLOBIN A1C: Hgb A1c MFr Bld: 5.9 % (ref 4.6–6.5)

## 2015-04-16 LAB — PSA: PSA: 3.02 ng/mL (ref 0.10–4.00)

## 2015-04-16 LAB — LDL CHOLESTEROL, DIRECT: Direct LDL: 146 mg/dL

## 2015-04-16 MED ORDER — METOPROLOL SUCCINATE ER 50 MG PO TB24: 50.0000 mg | ORAL_TABLET | Freq: Every day | ORAL | Status: DC

## 2015-04-16 MED ORDER — HYDROCHLOROTHIAZIDE 12.5 MG PO CAPS: 12.5000 mg | ORAL_CAPSULE | Freq: Every day | ORAL | Status: DC

## 2015-04-16 MED ORDER — LOSARTAN POTASSIUM 100 MG PO TABS: 100.0000 mg | ORAL_TABLET | Freq: Every day | ORAL | Status: DC

## 2015-04-16 NOTE — Patient Instructions (Signed)
Remember flu vaccine later this Fall.. 

## 2015-04-16 NOTE — Progress Notes (Signed)
Subjective:    Patient ID: Luis Guzman, male    DOB: 06/08/1946, 69 y.o.   MRN: [id number]  HPI Patient seen for Medicare wants exam and medical follow-up. He has chronic problems of hyperlipidemia, type 2 diabetes which is been diet controlled, hypertension. He had cholecystectomy exactly year ago today and is done well since then. Diabetes never treated with medication. Blood pressure medications were changed last year has done well since then. No dizziness. No chest pains. Positive family history of prostate cancer in his father. He requests PSA testing. He does have occasional slow stream and nocturia. No major progression of symptoms.  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 He rarely takes pain medications for severe flareups. He had Prevnar 13 a year ago and needs Pneumovax. He plans to get flu vaccine later. Colonoscopy up-to-date. He's had previous shingles vaccine  1.  Risk factors based on Past Medical , Social, and Family history reviewed and as indicated above with no changes 2.  Limitations in physical activities None.  No recent falls. 3.  Depression/mood No active depression or anxiety issues 4.  Hearing No defiits 5.  ADLs independent in all. 6.  Cognitive function (orientation to time and place, language, writing, speech,memory) no short or long term memory issues.  Language and judgement intact. 7.  Home Safety no issues 8.  Height, weight, and visual acuity.all stable. 9.  Counseling discussed importance of regular exercise 10. Recommendation of preventive services. Pneumovax given. Recommend flu vaccine later this fall 11. Labs based on risk factors lipid, hepatic, hemoglobin A1c, PSA, basic metabolic panel. 12. Care Plan as above 13. Other Providers-none regularly 14. Written schedule of screening/prevention services given to patient.   Past Medical History  Diagnosis Date  . HYPERLIPIDEMIA 01/26/2009  . HYPERTENSION 01/26/2009  . GERD (gastroesophageal reflux  disease)   . Transfusion history     infant "anemia"  . 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 lower back pain, s/p cervical neck surgery"stiffness" remains   Past Surgical History  Procedure Laterality Date  . Cervical laminectomy  1997    C 4 and C 5  . Shoulder arthroscopy w/ rotator cuff repair Bilateral 2004-2009    left 2004, right 2009  . Orif fibula fracture Left 09/2008    compartment syndrome  . Appendectomy  ~ 1959  . Esophagogastroduodenoscopy N/A 03/13/2014    Procedure: ESOPHAGOGASTRODUODENOSCOPY (EGD);  Surgeon: Wonda Horner, MD;  Location: [facility] ENDOSCOPY;  Service: Endoscopy;  Laterality: N/A;  . Back surgery    . Vasectomy    . Cholecystectomy N/A 04/15/2014    Procedure: LAPAROSCOPIC CHOLECYSTECTOMY WITH INTRAOPERATIVE CHOLANGIOGRAM;  Surgeon: Armandina Gemma, MD;  Location: [facility] ORS;  Service: General;  Laterality: N/A;    reports that he quit smoking about 46 years ago. His smoking use included Cigarettes. He has a 5 pack-year smoking history. He has never used smokeless tobacco. He reports that he drinks alcohol. He reports that he does not use illicit drugs. family history includes Arthritis in his sister; Hypertension in his father and mother; Stroke (age of onset: 1) in his sister. Allergies  Allergen Reactions  . Penicillins Itching      Review of Systems  Constitutional: Negative for fever, activity change, appetite change, fatigue and unexpected weight change.  HENT: Negative for congestion, ear pain and trouble swallowing.   Eyes: Negative for pain and visual disturbance.  Respiratory: Negative for cough, shortness of breath and wheezing.   Cardiovascular: Negative for chest  pain and palpitations.  Gastrointestinal: Negative for nausea, vomiting, abdominal pain, diarrhea, constipation, blood in stool, abdominal distention and rectal pain.  Genitourinary: Negative for dysuria, hematuria and testicular pain.  Musculoskeletal: Positive for  back pain and arthralgias. Negative for joint swelling.  Skin: Negative for rash.  Neurological: Negative for dizziness, syncope and headaches.  Hematological: Negative for adenopathy.  Psychiatric/Behavioral: Negative for confusion and dysphoric mood.       Objective:   Physical Exam  Constitutional: He is oriented to person, place, and time. He appears well-developed and well-nourished. No distress.  HENT:  Head: Normocephalic and atraumatic.  Right Ear: External ear normal.  Left Ear: External ear normal.  Mouth/Throat: Oropharynx is clear and moist.  Eyes: Conjunctivae and EOM are normal. Pupils are equal, round, and reactive to light.  Neck: Normal range of motion. Neck supple. No thyromegaly present.  Cardiovascular: Normal rate, regular rhythm and normal heart sounds.   No murmur heard. Pulmonary/Chest: No respiratory distress. He has no wheezes. He has no rales.  Abdominal: Soft. Bowel sounds are normal. He exhibits no distension and no mass. There is no tenderness. There is no rebound and no guarding.  Musculoskeletal: He exhibits no edema.  Lymphadenopathy:    He has no cervical adenopathy.  Neurological: He is alert and oriented to person, place, and time. He displays normal reflexes. No cranial nerve deficit.  Skin: No rash noted.  Psychiatric: He has a normal mood and affect.          Assessment & Plan:  #1 Medicare wellness exam. Plan is to get flu vaccine later this fall. Pneumovax given. Check PSA after long discussion regarding pros and cons of PSA testing. Patient does have positive family history and wishes to proceed. Colonoscopy up-to-date. #2 hypertension stable and at goal. Check basic metabolic panel. Refill medications for one year #3 hyperlipidemia. Intolerant of basically all statins. We discussed other medications such as Zetia and Wellchol and he is not interested at this time #4 positive family history of prostate cancer. PSA testing as above. #5  chronic low back pain. Intermittent use of muscle relaxers and pain medications as indicated. #6 type 2 diabetes. History of diet controlled. Recheck A1c

## 2015-04-16 NOTE — Progress Notes (Signed)
Pre visit review using our clinic review tool, if applicable. No additional management support is needed unless otherwise documented below in the visit note. 

## 2015-04-17 ENCOUNTER — Telehealth: Payer: Self-pay

## 2015-04-17 NOTE — Telephone Encounter (Signed)
The medication that mixes with water [facility]) would not be a good choice because his triglycerides came back high and WelChol can further increase hypertriglyceridemia. The injectable medication-Praluent, is only approved for familial hyperlipidemia or people with documented atherosclerotic cardiovascular disease who cannot take a statin or need additional lipid-lowering who are on statin This medication is extremely expensive and I don't think he would qualify for insurance coverage

## 2015-04-17 NOTE — Telephone Encounter (Signed)
Pt states that you all discussed a medication that he can mixed with water or a injection for his cholesterol.

## 2015-04-21 NOTE — Telephone Encounter (Signed)
Per message patient states it okay to leave detailed message on VM. Left detailed message on patient VM.

## 2015-04-21 NOTE — Telephone Encounter (Signed)
Left message for patient to return call.

## 2015-05-04 DIAGNOSIS — M5412 Radiculopathy, cervical region: Secondary | ICD-10-CM | POA: Diagnosis not present

## 2015-05-04 DIAGNOSIS — M4722 Other spondylosis with radiculopathy, cervical region: Secondary | ICD-10-CM | POA: Diagnosis not present

## 2015-05-04 DIAGNOSIS — M542 Cervicalgia: Secondary | ICD-10-CM | POA: Diagnosis not present

## 2015-05-12 DIAGNOSIS — M25562 Pain in left knee: Secondary | ICD-10-CM | POA: Diagnosis not present

## 2015-05-24 ENCOUNTER — Other Ambulatory Visit: Payer: Self-pay | Admitting: Family Medicine

## 2015-07-30 DIAGNOSIS — Z23 Encounter for immunization: Secondary | ICD-10-CM | POA: Diagnosis not present

## 2015-07-31 ENCOUNTER — Ambulatory Visit (INDEPENDENT_AMBULATORY_CARE_PROVIDER_SITE_OTHER): Payer: Medicare Other | Admitting: Family Medicine

## 2015-07-31 DIAGNOSIS — J019 Acute sinusitis, unspecified: Secondary | ICD-10-CM

## 2015-07-31 MED ORDER — CEFDINIR 300 MG PO CAPS: 300.0000 mg | ORAL_CAPSULE | Freq: Two times a day (BID) | ORAL | Status: DC

## 2015-07-31 MED ORDER — HYDROCODONE-HOMATROPINE 5-1.5 MG/5ML PO SYRP: 5.0000 mL | ORAL_SOLUTION | Freq: Four times a day (QID) | ORAL | Status: AC | PRN

## 2015-07-31 NOTE — Progress Notes (Signed)
   Subjective:    Patient ID: Luis Guzman, male    DOB: 1946-07-27, 69 y.o.   MRN: [id number]  HPI Acute visit. Developed cold-like symptoms about 3 weeks ago. Was improving somewhat and then over week ago developed worsening of symptoms Upper teeth pain. Nasal congestion. Headaches. Right maxillary sinus pressure. Cough productive of yellow sputum. Frequent sore throat symptoms. Thick yellow nasal mucus. Cough is especially bothersome at night Reported allergy to penicillin. This was back in his childhood. No history of anaphylaxis.  Past Medical History  Diagnosis Date  . HYPERLIPIDEMIA 01/26/2009  . HYPERTENSION 01/26/2009  . GERD (gastroesophageal reflux disease)   . Transfusion history     infant "anemia"  . 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 lower back pain, s/p cervical neck surgery"stiffness" remains   Past Surgical History  Procedure Laterality Date  . Cervical laminectomy  1997    C 4 and C 5  . Shoulder arthroscopy w/ rotator cuff repair Bilateral 2004-2009    left 2004, right 2009  . Orif fibula fracture Left 09/2008    compartment syndrome  . Appendectomy  ~ 1959  . Esophagogastroduodenoscopy N/A 03/13/2014    Procedure: ESOPHAGOGASTRODUODENOSCOPY (EGD);  Surgeon: Wonda Horner, MD;  Location: [facility] ENDOSCOPY;  Service: Endoscopy;  Laterality: N/A;  . Back surgery    . Vasectomy    . Cholecystectomy N/A 04/15/2014    Procedure: LAPAROSCOPIC CHOLECYSTECTOMY WITH INTRAOPERATIVE CHOLANGIOGRAM;  Surgeon: Armandina Gemma, MD;  Location: [facility] ORS;  Service: General;  Laterality: N/A;    reports that he quit smoking about 46 years ago. His smoking use included Cigarettes. He has a 5 pack-year smoking history. He has never used smokeless tobacco. He reports that he drinks alcohol. He reports that he does not use illicit drugs. family history includes Arthritis in his sister; Hypertension in his father and mother; Stroke (age of onset: 67) in his  sister. Allergies  Allergen Reactions  . Penicillins Itching      Review of Systems  Constitutional: Positive for fatigue. Negative for fever and chills.  HENT: Positive for congestion and sinus pressure.   Respiratory: Positive for cough.        Objective:   Physical Exam  Constitutional: He appears well-developed and well-nourished.  HENT:  Right Ear: External ear normal.  Left Ear: External ear normal.  Mouth/Throat: Oropharynx is clear and moist.  Erythematous nasal mucosa with thick yellow mucus bilaterally  Neck: Neck supple.  Cardiovascular: Normal rate and regular rhythm.   Pulmonary/Chest: Effort normal and breath sounds normal. No respiratory distress. He has no wheezes. He has no rales.  Lymphadenopathy:    He has no cervical adenopathy.          Assessment & Plan:  Probable acute sinusitis. Given duration of symptoms, start Omnicef 300 mg twice a day for 10 days. Hycodan cough syrup as needed for severe cough. Hydrate well. Continue Mucinex. Follow-up as needed

## 2015-07-31 NOTE — Patient Instructions (Signed)

## 2015-07-31 NOTE — Progress Notes (Signed)
Pre visit review using our clinic review tool, if applicable. No additional management support is needed unless otherwise documented below in the visit note. 

## 2015-08-05 DIAGNOSIS — M5412 Radiculopathy, cervical region: Secondary | ICD-10-CM | POA: Diagnosis not present

## 2015-08-05 DIAGNOSIS — M4722 Other spondylosis with radiculopathy, cervical region: Secondary | ICD-10-CM | POA: Diagnosis not present

## 2015-08-05 DIAGNOSIS — M542 Cervicalgia: Secondary | ICD-10-CM | POA: Diagnosis not present

## 2015-08-06 DIAGNOSIS — M4722 Other spondylosis with radiculopathy, cervical region: Secondary | ICD-10-CM | POA: Diagnosis not present

## 2015-08-06 DIAGNOSIS — M542 Cervicalgia: Secondary | ICD-10-CM | POA: Diagnosis not present

## 2015-08-07 ENCOUNTER — Telehealth: Payer: Self-pay | Admitting: Family Medicine

## 2015-08-07 MED ORDER — AZITHROMYCIN 250 MG PO TABS: ORAL_TABLET | ORAL | Status: DC

## 2015-08-07 NOTE — Telephone Encounter (Signed)
D/C Cefdinir.  Start Zithromax (Z-pack) 5 day pack.

## 2015-08-07 NOTE — Telephone Encounter (Signed)
Luis Guzman called saying he thinks he's having some adverse reactions to taking Cefdinir. Within the past few days his stomach is extremely bloated, his tongue feels like it's been scorched and his taste buds are off, and he's constantly feeling nauseous. He'd like a phone call regarding this.  Pt's ph# [phone] Thank you.

## 2015-08-07 NOTE — Telephone Encounter (Signed)
New Rx sent.  Patient aware.  Added to allergy list.

## 2015-08-13 ENCOUNTER — Encounter: Payer: Self-pay | Admitting: *Deleted

## 2015-09-30 DIAGNOSIS — H40012 Open angle with borderline findings, low risk, left eye: Secondary | ICD-10-CM | POA: Diagnosis not present

## 2015-09-30 DIAGNOSIS — H40053 Ocular hypertension, bilateral: Secondary | ICD-10-CM | POA: Diagnosis not present

## 2015-11-02 DIAGNOSIS — H40053 Ocular hypertension, bilateral: Secondary | ICD-10-CM | POA: Diagnosis not present

## 2015-11-26 DIAGNOSIS — M4722 Other spondylosis with radiculopathy, cervical region: Secondary | ICD-10-CM | POA: Diagnosis not present

## 2015-11-26 DIAGNOSIS — M542 Cervicalgia: Secondary | ICD-10-CM | POA: Diagnosis not present

## 2015-12-09 DIAGNOSIS — H40053 Ocular hypertension, bilateral: Secondary | ICD-10-CM | POA: Diagnosis not present

## 2015-12-31 DIAGNOSIS — M4722 Other spondylosis with radiculopathy, cervical region: Secondary | ICD-10-CM | POA: Diagnosis not present

## 2015-12-31 DIAGNOSIS — M542 Cervicalgia: Secondary | ICD-10-CM | POA: Diagnosis not present

## 2015-12-31 DIAGNOSIS — M5412 Radiculopathy, cervical region: Secondary | ICD-10-CM | POA: Diagnosis not present

## 2016-01-15 ENCOUNTER — Other Ambulatory Visit: Payer: Self-pay | Admitting: Gastroenterology

## 2016-01-15 DIAGNOSIS — R10811 Right upper quadrant abdominal tenderness: Secondary | ICD-10-CM | POA: Diagnosis not present

## 2016-01-15 DIAGNOSIS — R195 Other fecal abnormalities: Secondary | ICD-10-CM | POA: Diagnosis not present

## 2016-01-15 DIAGNOSIS — R194 Change in bowel habit: Secondary | ICD-10-CM | POA: Diagnosis not present

## 2016-01-15 DIAGNOSIS — R1011 Right upper quadrant pain: Secondary | ICD-10-CM

## 2016-01-22 ENCOUNTER — Ambulatory Visit
Admission: RE | Admit: 2016-01-22 | Discharge: 2016-01-22 | Disposition: A | Discharge status: Home / Self Care | Payer: Medicare Other | Source: Ambulatory Visit | Attending: Gastroenterology | Admitting: Gastroenterology

## 2016-01-22 DIAGNOSIS — R1011 Right upper quadrant pain: Secondary | ICD-10-CM

## 2016-02-09 ENCOUNTER — Encounter: Payer: Self-pay | Admitting: Family Medicine

## 2016-02-09 LAB — HM COLONOSCOPY

## 2016-02-15 ENCOUNTER — Other Ambulatory Visit: Payer: Self-pay | Admitting: Gastroenterology

## 2016-02-15 DIAGNOSIS — K3189 Other diseases of stomach and duodenum: Secondary | ICD-10-CM | POA: Diagnosis not present

## 2016-02-15 DIAGNOSIS — D121 Benign neoplasm of appendix: Secondary | ICD-10-CM | POA: Diagnosis not present

## 2016-02-15 DIAGNOSIS — K634 Enteroptosis: Secondary | ICD-10-CM | POA: Diagnosis not present

## 2016-02-15 DIAGNOSIS — R197 Diarrhea, unspecified: Secondary | ICD-10-CM | POA: Diagnosis not present

## 2016-02-15 DIAGNOSIS — R634 Abnormal weight loss: Secondary | ICD-10-CM | POA: Diagnosis not present

## 2016-02-15 DIAGNOSIS — R195 Other fecal abnormalities: Secondary | ICD-10-CM | POA: Diagnosis not present

## 2016-02-15 DIAGNOSIS — D126 Benign neoplasm of colon, unspecified: Secondary | ICD-10-CM | POA: Diagnosis not present

## 2016-02-25 ENCOUNTER — Other Ambulatory Visit: Payer: Self-pay | Admitting: Family Medicine

## 2016-03-08 ENCOUNTER — Encounter: Payer: Self-pay | Admitting: Family Medicine

## 2016-03-09 DIAGNOSIS — H40053 Ocular hypertension, bilateral: Secondary | ICD-10-CM | POA: Diagnosis not present

## 2016-03-14 ENCOUNTER — Encounter: Payer: Self-pay | Admitting: Family Medicine

## 2016-04-15 ENCOUNTER — Ambulatory Visit (INDEPENDENT_AMBULATORY_CARE_PROVIDER_SITE_OTHER): Payer: Medicare Other | Admitting: Family Medicine

## 2016-04-15 ENCOUNTER — Encounter: Payer: Self-pay | Admitting: Family Medicine

## 2016-04-15 VITALS — BP 120/80 | HR 71 | Temp 98.2°F | Ht 71.5 in | Wt 192.0 lb

## 2016-04-15 DIAGNOSIS — E119 Type 2 diabetes mellitus without complications: Secondary | ICD-10-CM

## 2016-04-15 DIAGNOSIS — Z23 Encounter for immunization: Secondary | ICD-10-CM

## 2016-04-15 DIAGNOSIS — I714 Abdominal aortic aneurysm, without rupture, unspecified: Secondary | ICD-10-CM | POA: Insufficient documentation

## 2016-04-15 DIAGNOSIS — I10 Essential (primary) hypertension: Secondary | ICD-10-CM | POA: Diagnosis not present

## 2016-04-15 DIAGNOSIS — R351 Nocturia: Secondary | ICD-10-CM | POA: Diagnosis not present

## 2016-04-15 DIAGNOSIS — Z0000 Encounter for general adult medical examination without abnormal findings: Secondary | ICD-10-CM | POA: Diagnosis not present

## 2016-04-15 DIAGNOSIS — E785 Hyperlipidemia, unspecified: Secondary | ICD-10-CM

## 2016-04-15 LAB — BASIC METABOLIC PANEL
BUN: 14 mg/dL (ref 6–23)
CO2: 32 mEq/L (ref 19–32)
Calcium: 9.2 mg/dL (ref 8.4–10.5)
Chloride: 103 mEq/L (ref 96–112)
Creatinine, Ser: 0.86 mg/dL (ref 0.40–1.50)
GFR: 93.43 mL/min (ref >60.00)
Glucose, Bld: 109 mg/dL — ABNORMAL HIGH (ref 70–99)
Potassium: 3.4 mEq/L — ABNORMAL LOW (ref 3.5–5.1)
Sodium: 142 mEq/L (ref 135–145)

## 2016-04-15 LAB — LIPID PANEL
Cholesterol: 245 mg/dL — ABNORMAL HIGH (ref 0–200)
HDL: 36 mg/dL — ABNORMAL LOW (ref >39.00)
LDL Cholesterol: 175 mg/dL — ABNORMAL HIGH (ref 0–99)
NonHDL: 209.07
Total CHOL/HDL Ratio: 7
Triglycerides: 169 mg/dL — ABNORMAL HIGH (ref 0.0–149.0)
VLDL: 33.8 mg/dL (ref 0.0–40.0)

## 2016-04-15 LAB — HEPATIC FUNCTION PANEL
ALT: 16 U/L (ref 0–53)
AST: 19 U/L (ref 0–37)
Albumin: 4.4 g/dL (ref 3.5–5.2)
Alkaline Phosphatase: 91 U/L (ref 39–117)
Bilirubin, Direct: 0.1 mg/dL (ref 0.0–0.3)
Total Bilirubin: 0.6 mg/dL (ref 0.2–1.2)
Total Protein: 6.7 g/dL (ref 6.0–8.3)

## 2016-04-15 LAB — PSA: PSA: 3.97 ng/mL (ref 0.10–4.00)

## 2016-04-15 LAB — HEMOGLOBIN A1C: Hgb A1c MFr Bld: 5.8 % (ref 4.6–6.5)

## 2016-04-15 MED ORDER — HYDROCHLOROTHIAZIDE 12.5 MG PO CAPS: 12.5000 mg | ORAL_CAPSULE | Freq: Every day | ORAL | 3 refills | Status: DC

## 2016-04-15 MED ORDER — METOPROLOL SUCCINATE ER 50 MG PO TB24: 50.0000 mg | ORAL_TABLET | Freq: Every day | ORAL | 3 refills | Status: DC

## 2016-04-15 MED ORDER — LOSARTAN POTASSIUM 100 MG PO TABS: 100.0000 mg | ORAL_TABLET | Freq: Every day | ORAL | 3 refills | Status: DC

## 2016-04-15 NOTE — Progress Notes (Signed)
Subjective:     Patient ID: Luis Guzman, male   DOB: Dec 08, 1945, 70 y.o.   MRN: [id number]  HPI Patient seen for Medicare wellness visit and medical follow-up. His chronic medical problems include history of hypertension, type 2 diabetes which is diet controlled, dyslipidemia, history of pancreatitis, biliary dyskinesia. He's had history of frequent bloating and loose stools. He had full EGD and colonoscopy through the [place] this past year with no abnormalities. He has scheduled follow-up with GI. He's had previous cholecystectomy which did not resolve any symptoms.  Regarding his lipids, he has been on statins in the past but had intolerance with basically all of them. 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  No recent abdominal pain.  Needs flu vaccination. Other immunizations up-to-date.  Hypertension which has been controlled with metoprolol, losartan, and HCTZ. He has hyperlipidemia as above. Has chronic osteoarthritis especially involving the neck. Takes very infrequent oxycodone for that.  1.  Risk factors based on Past Medical , Social, and Family history reviewed and as indicated above with no changes 2.  Limitations in physical activities None.  No recent falls. 3.  Depression/mood No active depression or anxiety issues 4.  Hearing No defiits 5.  ADLs independent in all. 6.  Cognitive function (orientation to time and place, language, writing, speech,memory) no short or long term memory issues.  Language and judgement intact. 7.  Home Safety no issues 8.  Height, weight, and visual acuity.all stable. 9.  Counseling discussed Maintenance of healthy weight and regular exercise 10. Recommendation of preventive services. Flu vaccine given 11. Labs based on risk factors-lipid, hepatic, basic metabolic panel, hemoglobin A1c, PSA 12. Care Plan-as above 13. Other Providers-Dr. Buccinn[facility] 14. Written schedule of  screening/prevention services given to patient.   Past Medical History:  Diagnosis Date  . GERD (gastroesophageal reflux disease)   . HYPERLIPIDEMIA 01/26/2009  . HYPERTENSION 01/26/2009  . 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 lower back pain, s/p cervical neck surgery"stiffness" remains  . Transfusion history    infant "anemia"   Past Surgical History:  Procedure Laterality Date  . APPENDECTOMY  ~ 1959  . BACK SURGERY    . CERVICAL LAMINECTOMY  1997   C 4 and C 5  . CHOLECYSTECTOMY N/A 04/15/2014   Procedure: LAPAROSCOPIC CHOLECYSTECTOMY WITH INTRAOPERATIVE CHOLANGIOGRAM;  Surgeon: Armandina Gemma, MD;  Location: [facility] ORS;  Service: General;  Laterality: N/A;  . ESOPHAGOGASTRODUODENOSCOPY N/A 03/13/2014   Procedure: ESOPHAGOGASTRODUODENOSCOPY (EGD);  Surgeon: Wonda Horner, MD;  [facility] ENDOSCOPY;  Service: Endoscopy;  Laterality: N/A;  . 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  . SHOULDER ARTHROSCOPY W/ ROTATOR CUFF REPAIR Bilateral 2004-2009   left 2004, right 2009  . VASECTOMY      reports that he quit smoking about 47 years ago. His smoking use included Cigarettes. He has a 5.00 pack-year smoking history. He has never used smokeless tobacco. He reports that he drinks alcohol. He reports that he does not use drugs. family history includes Arthritis in his sister; Hypertension in his father and mother; Stroke (age of onset: 58) in his sister. Allergies  Allergen Reactions  . Cefdinir Other (See Comments)  . Penicillins Itching     Review of Systems  Constitutional: Negative for activity change, appetite change, fatigue and fever.  HENT: Negative for congestion, ear pain and trouble swallowing.   Eyes: Negative for pain and visual disturbance.  Respiratory:  Negative for cough, shortness of breath and wheezing.   Cardiovascular: Negative for chest pain and palpitations.  Gastrointestinal: Positive for diarrhea. Negative for abdominal  distention, abdominal pain, blood in stool, constipation, nausea, rectal pain and vomiting.  Genitourinary: Negative for dysuria, hematuria and testicular pain.  Musculoskeletal: Positive for arthralgias and neck pain. Negative for joint swelling.  Skin: Negative for rash.  Neurological: Negative for dizziness, syncope and headaches.  Hematological: Negative for adenopathy.  Psychiatric/Behavioral: Negative for confusion and dysphoric mood.       Objective:   Physical Exam  Constitutional: He is oriented to person, place, and time. He appears well-developed and well-nourished. No distress.  HENT:  Head: Normocephalic and atraumatic.  Right Ear: External ear normal.  Left Ear: External ear normal.  Mouth/Throat: Oropharynx is clear and moist.  Eyes: Conjunctivae and EOM are normal. Pupils are equal, round, and reactive to light.  Neck: Normal range of motion. Neck supple. No thyromegaly present.  Cardiovascular: Normal rate, regular rhythm and normal heart sounds.   No murmur heard. Pulmonary/Chest: No respiratory distress. He has no wheezes. He has no rales.  Abdominal: Soft. Bowel sounds are normal. He exhibits no distension and no mass. There is no tenderness. There is no rebound and no guarding.  Musculoskeletal: He exhibits no edema.  Lymphadenopathy:    He has no cervical adenopathy.  Neurological: He is alert and oriented to person, place, and time. He displays normal reflexes. No cranial nerve deficit.  Skin: No rash noted.  Psychiatric: He has a normal mood and affect.       Assessment:     #1 Medicare subsequent annual wellness visit. Patient needs flu vaccination. Other vaccines up-to-date  #2 hypertension stable and at goal. 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

## 2016-04-19 DIAGNOSIS — R194 Change in bowel habit: Secondary | ICD-10-CM | POA: Diagnosis not present

## 2016-04-19 DIAGNOSIS — R140 Abdominal distension (gaseous): Secondary | ICD-10-CM | POA: Diagnosis not present

## 2016-04-19 DIAGNOSIS — R195 Other fecal abnormalities: Secondary | ICD-10-CM | POA: Diagnosis not present

## 2016-04-27 DIAGNOSIS — M7541 Impingement syndrome of right shoulder: Secondary | ICD-10-CM | POA: Diagnosis not present

## 2016-05-05 DIAGNOSIS — M4722 Other spondylosis with radiculopathy, cervical region: Secondary | ICD-10-CM | POA: Diagnosis not present

## 2016-05-07 ENCOUNTER — Other Ambulatory Visit: Payer: Self-pay | Admitting: Orthopaedic Surgery

## 2016-05-07 DIAGNOSIS — M25511 Pain in right shoulder: Secondary | ICD-10-CM

## 2016-05-13 ENCOUNTER — Ambulatory Visit
Admission: RE | Admit: 2016-05-13 | Discharge: 2016-05-13 | Disposition: A | Discharge status: Home / Self Care | Payer: Medicare Other | Source: Ambulatory Visit | Attending: Orthopaedic Surgery | Admitting: Orthopaedic Surgery

## 2016-05-13 DIAGNOSIS — M25511 Pain in right shoulder: Secondary | ICD-10-CM

## 2016-05-26 ENCOUNTER — Ambulatory Visit (INDEPENDENT_AMBULATORY_CARE_PROVIDER_SITE_OTHER): Payer: Self-pay | Admitting: Orthopaedic Surgery

## 2016-05-26 ENCOUNTER — Ambulatory Visit (INDEPENDENT_AMBULATORY_CARE_PROVIDER_SITE_OTHER): Payer: Medicare Other | Admitting: Orthopaedic Surgery

## 2016-05-26 DIAGNOSIS — M25511 Pain in right shoulder: Secondary | ICD-10-CM

## 2016-05-26 DIAGNOSIS — M25562 Pain in left knee: Secondary | ICD-10-CM

## 2016-06-07 DIAGNOSIS — M25511 Pain in right shoulder: Secondary | ICD-10-CM | POA: Diagnosis not present

## 2016-06-08 DIAGNOSIS — I10 Essential (primary) hypertension: Secondary | ICD-10-CM | POA: Diagnosis not present

## 2016-06-08 DIAGNOSIS — Z6825 Body mass index (BMI) 25.0-25.9, adult: Secondary | ICD-10-CM | POA: Diagnosis not present

## 2016-06-08 DIAGNOSIS — M542 Cervicalgia: Secondary | ICD-10-CM | POA: Diagnosis not present

## 2016-06-08 DIAGNOSIS — H40053 Ocular hypertension, bilateral: Secondary | ICD-10-CM | POA: Diagnosis not present

## 2016-06-08 DIAGNOSIS — M5412 Radiculopathy, cervical region: Secondary | ICD-10-CM | POA: Diagnosis not present

## 2016-06-08 DIAGNOSIS — M4722 Other spondylosis with radiculopathy, cervical region: Secondary | ICD-10-CM | POA: Diagnosis not present

## 2016-07-18 DIAGNOSIS — R194 Change in bowel habit: Secondary | ICD-10-CM | POA: Diagnosis not present

## 2016-07-18 DIAGNOSIS — I10 Essential (primary) hypertension: Secondary | ICD-10-CM | POA: Diagnosis not present

## 2016-07-18 DIAGNOSIS — R140 Abdominal distension (gaseous): Secondary | ICD-10-CM | POA: Diagnosis not present

## 2016-07-18 DIAGNOSIS — M5412 Radiculopathy, cervical region: Secondary | ICD-10-CM | POA: Diagnosis not present

## 2016-07-18 DIAGNOSIS — Z6826 Body mass index (BMI) 26.0-26.9, adult: Secondary | ICD-10-CM | POA: Diagnosis not present

## 2016-08-01 DIAGNOSIS — R140 Abdominal distension (gaseous): Secondary | ICD-10-CM | POA: Diagnosis not present

## 2016-08-03 DIAGNOSIS — R140 Abdominal distension (gaseous): Secondary | ICD-10-CM | POA: Diagnosis not present

## 2016-08-05 DIAGNOSIS — R140 Abdominal distension (gaseous): Secondary | ICD-10-CM | POA: Diagnosis not present

## 2016-08-19 ENCOUNTER — Other Ambulatory Visit (INDEPENDENT_AMBULATORY_CARE_PROVIDER_SITE_OTHER): Payer: Medicare Other

## 2016-08-19 DIAGNOSIS — I10 Essential (primary) hypertension: Secondary | ICD-10-CM | POA: Diagnosis not present

## 2016-08-19 DIAGNOSIS — E785 Hyperlipidemia, unspecified: Secondary | ICD-10-CM | POA: Diagnosis not present

## 2016-08-19 DIAGNOSIS — E119 Type 2 diabetes mellitus without complications: Secondary | ICD-10-CM

## 2016-08-19 DIAGNOSIS — Z125 Encounter for screening for malignant neoplasm of prostate: Secondary | ICD-10-CM

## 2016-08-19 LAB — HEMOGLOBIN A1C: Hgb A1c MFr Bld: 5.9 % (ref 4.6–6.5)

## 2016-08-19 LAB — CBC WITH DIFFERENTIAL/PLATELET
Basophils Absolute: 0.1 10*3/uL (ref 0.0–0.1)
Basophils Relative: 0.8 % (ref 0.0–3.0)
Eosinophils Absolute: 0.1 10*3/uL (ref 0.0–0.7)
Eosinophils Relative: 1.7 % (ref 0.0–5.0)
HCT: 45.1 % (ref 39.0–52.0)
Hemoglobin: 15.5 g/dL (ref 13.0–17.0)
Lymphocytes Relative: 26 % (ref 12.0–46.0)
Lymphs Abs: 2 10*3/uL (ref 0.7–4.0)
MCHC: 34.5 g/dL (ref 30.0–36.0)
MCV: 89.5 fl (ref 78.0–100.0)
Monocytes Absolute: 0.7 10*3/uL (ref 0.1–1.0)
Monocytes Relative: 9.5 % (ref 3.0–12.0)
Neutro Abs: 4.8 10*3/uL (ref 1.4–7.7)
Neutrophils Relative %: 62 % (ref 43.0–77.0)
Platelets: 205 10*3/uL (ref 150.0–400.0)
RBC: 5.03 Mil/uL (ref 4.22–5.81)
RDW: 13.1 % (ref 11.5–15.5)
WBC: 7.7 10*3/uL (ref 4.0–10.5)

## 2016-08-19 LAB — LIPID PANEL
Cholesterol: 204 mg/dL — ABNORMAL HIGH (ref 0–200)
HDL: 30.4 mg/dL — ABNORMAL LOW (ref >39.00)
NonHDL: 173.89
Total CHOL/HDL Ratio: 7
Triglycerides: 203 mg/dL — ABNORMAL HIGH (ref 0.0–149.0)
VLDL: 40.6 mg/dL — ABNORMAL HIGH (ref 0.0–40.0)

## 2016-08-19 LAB — BASIC METABOLIC PANEL
BUN: 15 mg/dL (ref 6–23)
CO2: 31 mEq/L (ref 19–32)
Calcium: 9.1 mg/dL (ref 8.4–10.5)
Chloride: 107 mEq/L (ref 96–112)
Creatinine, Ser: 0.94 mg/dL (ref 0.40–1.50)
GFR: 84.23 mL/min (ref >60.00)
Glucose, Bld: 135 mg/dL — ABNORMAL HIGH (ref 70–99)
Potassium: 3.8 mEq/L (ref 3.5–5.1)
Sodium: 144 mEq/L (ref 135–145)

## 2016-08-19 LAB — MICROALBUMIN / CREATININE URINE RATIO
Creatinine,U: 273.8 mg/dL
Microalb Creat Ratio: 0.4 mg/g (ref 0.0–30.0)
Microalb, Ur: 1.2 mg/dL (ref 0.0–1.9)

## 2016-08-19 LAB — TSH: TSH: 0.79 u[IU]/mL (ref 0.35–4.50)

## 2016-08-19 LAB — HEPATIC FUNCTION PANEL
ALT: 16 U/L (ref 0–53)
AST: 20 U/L (ref 0–37)
Albumin: 4.1 g/dL (ref 3.5–5.2)
Alkaline Phosphatase: 100 U/L (ref 39–117)
Bilirubin, Direct: 0.1 mg/dL (ref 0.0–0.3)
Total Bilirubin: 0.4 mg/dL (ref 0.2–1.2)
Total Protein: 6.3 g/dL (ref 6.0–8.3)

## 2016-08-19 LAB — LDL CHOLESTEROL, DIRECT: Direct LDL: 129 mg/dL

## 2016-08-19 LAB — PSA: PSA: 3.98 ng/mL (ref 0.10–4.00)

## 2016-08-23 ENCOUNTER — Telehealth: Payer: Self-pay

## 2016-08-23 NOTE — Telephone Encounter (Signed)
Pt had CPE labs completed but there is not a physical on the books. Can you schedule/reschedule this appointment. Results will be reviewed at visit. Thank you.

## 2016-09-09 NOTE — Telephone Encounter (Signed)
Pt had CPE on 04/15/16.  Pt states his PSA was a little off , and that was hat needed to be repeated. Pt has not heard anything from these labs.  Advised pt I would call him back if anything further is needed.

## 2016-09-12 ENCOUNTER — Other Ambulatory Visit: Payer: Self-pay

## 2016-09-12 DIAGNOSIS — R972 Elevated prostate specific antigen [PSA]: Secondary | ICD-10-CM

## 2016-09-12 NOTE — Progress Notes (Signed)
Labs all stable.  PSA unchanged from 5 months ago.  Lipids are improved.

## 2016-09-12 NOTE — Telephone Encounter (Signed)
After speaking with wife--I have set pt up for repeat PSA in 6 months. Can you anotate on the rest of the labs?

## 2016-09-12 NOTE — Telephone Encounter (Signed)
He just had repeat labs on 08-19-16 and PSA unchanged from 5 months ago.  I would just check again in 6 months since no change with last PSA.

## 2016-09-12 NOTE — Telephone Encounter (Signed)
Looks like CPE was 04-15-2016. Please advise on results or does pt need to come back.

## 2016-09-13 DIAGNOSIS — R110 Nausea: Secondary | ICD-10-CM | POA: Diagnosis not present

## 2016-09-20 ENCOUNTER — Ambulatory Visit (INDEPENDENT_AMBULATORY_CARE_PROVIDER_SITE_OTHER): Payer: Medicare Other | Admitting: Orthopaedic Surgery

## 2016-09-20 ENCOUNTER — Encounter (INDEPENDENT_AMBULATORY_CARE_PROVIDER_SITE_OTHER): Payer: Self-pay | Admitting: Orthopaedic Surgery

## 2016-09-20 VITALS — BP 157/78 | HR 57 | Ht 72.0 in | Wt 193.0 lb

## 2016-09-20 DIAGNOSIS — G8929 Other chronic pain: Secondary | ICD-10-CM

## 2016-09-20 DIAGNOSIS — M25511 Pain in right shoulder: Secondary | ICD-10-CM | POA: Diagnosis not present

## 2016-09-20 MED ORDER — METHYLPREDNISOLONE ACETATE 40 MG/ML IJ SUSP
80.0000 mg | INTRAMUSCULAR | Status: AC | PRN
  Administered 2016-09-20: 80 mg

## 2016-09-20 MED ORDER — BUPIVACAINE HCL 0.5 % IJ SOLN
2.0000 mL | INTRAMUSCULAR | Status: AC | PRN
  Administered 2016-09-20: 2 mL via INTRA_ARTICULAR

## 2016-09-20 MED ORDER — LIDOCAINE HCL 1 % IJ SOLN
2.0000 mL | INTRAMUSCULAR | Status: AC | PRN
  Administered 2016-09-20: 2 mL

## 2016-09-20 NOTE — Progress Notes (Deleted)
Office Visit Note   Patient: Luis Guzman           Date of Birth: [birth year]           MRN: [id number] Visit Date: 09/20/2016              Requested by: Eulas Post, MD [place], [place] [id number] PCP: Eulas Post, MD   Assessment & Plan: Visit Diagnoses:  1. 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 Plan: Long discussion regarding treatment options including rotator cuff tear repair with subacromial decompression and distal clavicle resection. Luis Guzman would prefer to have another cortisone injection.  Follow-Up Instructions: Return if symptoms worsen or fail to improve.   Orders:  No orders of the defined types were placed in this encounter.  No orders of the defined types were placed in this encounter.     Procedures: Large Joint Inj Date/Time: 09/20/2016 10:14 AM Performed by: Garald Balding Authorized by: Garald Balding   Consent Given by:  Patient Timeout: prior to procedure the correct patient, procedure, and site was verified   Indications:  Pain Location:  Shoulder Site:  L subacromial bursa Prep: patient was prepped and draped in usual sterile fashion   Needle Size:  25 G Needle Length:  1.5 inches Approach:  Lateral Ultrasound Guidance: No   Fluoroscopic Guidance: No   Arthrogram: No   Medications:  80 mg methylPREDNISolone acetate 40 MG/ML; 2 mL lidocaine 1 %; 2 mL bupivacaine 0.5 % Aspiration Attempted: No   Patient tolerance:  Patient tolerated the procedure well with no immediate complications     Clinical Data: No additional findings.   Subjective: Chief Complaint  Patient presents with  . Right Shoulder - Pain    HPI Luis Guzman has been seen on several occasions for the problem with his right shoulder dating back over a year. He's had a successful subacromial cortisone injection in September. MRI scan demonstrated a  small full-thickness tear of the supraspinatus. He is aware of the option of surgery but prefers to have the cortisone injection. He is having trouble sleeping and raising his arm over his head related to pain.  Review of Systems   Objective: Vital Signs: BP (!) 157/78   Pulse (!) 57   Ht 6' (1.829 m)   Wt 193 lb (87.5 kg)   BMI 26.18 kg/m   Physical Exam  Ortho Exam right shoulder with positive impingement and minimally positive empty can testing. Full overhead arc of motion but full flexion and abduction. Biceps intact. Local tenderness at the subacromial region. Skin intact neurovascular exam is intact.  Specialty Comments:  No specialty comments available.  Imaging: No results found.   PMFS History: Patient Active Problem List   Diagnosis Date Noted  . AAA (abdominal aortic aneurysm) without rupture ([place]) 04/15/2016  . Biliary dyskinesia 04/08/2014  . Abdominal pain, epigastric 04/08/2014  . Pancreatitis, acute 03/12/2014  . Pancreatitis 03/12/2014  . Type 2 diabetes mellitus ([place]) 06/25/2012  . ACUTE BRONCHITIS 03/11/2010  . 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 SINUSITIS, ACUTE 06/11/2009  . Hyperlipidemia 01/26/2009  . Essential hypertension 01/26/2009   Past Medical History:  Diagnosis Date  . GERD (gastroesophageal reflux disease)   . HYPERLIPIDEMIA 01/26/2009  . HYPERTENSION 01/26/2009  . 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 lower back pain, s/p cervical neck surgery"stiffness" remains  . Transfusion history  infant "anemia"    Family History  Problem Relation Age of Onset  . Hypertension Mother   . Hypertension Father   . Arthritis Sister     rheumatoid  . Stroke Sister 14    Past Surgical History:  Procedure Laterality Date  . APPENDECTOMY  ~ 1959  . BACK SURGERY    . CERVICAL LAMINECTOMY  1997   C 4 and C 5  . CHOLECYSTECTOMY N/A 04/15/2014   Procedure: LAPAROSCOPIC CHOLECYSTECTOMY WITH  INTRAOPERATIVE CHOLANGIOGRAM;  Surgeon: Armandina Gemma, MD;  Location: [facility]S;  Service: General;  Laterality: N/A;  . ESOPHAGOGASTRODUODENOSCOPY N/A 03/13/2014   Procedure: ESOPHAGOGASTRODUODENOSCOPY (EGD);  Surgeon: Wonda Horner, MD;  Location: [facility] ENDOSCOPY;  Service: Endoscopy;  Laterality: N/A;  . 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  . SHOULDER ARTHROSCOPY W/ ROTATOR CUFF REPAIR Bilateral 2004-2009   left 2004, right 2009  . VASECTOMY     Social History   Occupational History  . Not on file.   Social History Main Topics  . Smoking status: Former Smoker    Packs/day: 1.00    Years: 5.00    Types: Cigarettes    Quit date: 10/17/1968  . Smokeless tobacco: Never Used  . Alcohol use Yes     Comment: "quit drinking ~ 10/1968  . Drug use: No  . Sexual activity: Not Currently

## 2016-09-20 NOTE — Progress Notes (Deleted)
Office Visit Note   Patient: Luis Guzman           Date of Birth: 02-08-1946           MRN: [id number] Visit Date: 09/20/2016              Requested by: Eulas Post, MD [place], [place] [id number] PCP: Eulas Post, MD   Assessment & Plan: Visit Diagnoses: No diagnosis found.  Plan: ***  Follow-Up Instructions: No Follow-up on file.   Orders:  No orders of the defined types were placed in this encounter.  No orders of the defined types were placed in this encounter.     Procedures: No procedures performed   Clinical Data: No additional findings.   Subjective: Chief Complaint  Patient presents with  . Right Shoulder - Pain    Last injection 04/27/16. Right Shoulder:  Had positive impingement but was able to raise his arm over his head and fairly quickly.  There was no numbness.  He does have some anterior shoulder pain beneath the acromion, none posteriorly.  Minimally positive impingement and no loss of motion.       Review of Systems   Objective: Vital Signs: There were no vitals taken for this visit.  Physical Exam  Ortho Exam  Specialty Comments:  No specialty comments available.  Imaging: No results found.   PMFS History: Patient Active Problem List   Diagnosis Date Noted  . AAA (abdominal aortic aneurysm) without rupture ([place]) 04/15/2016  . Biliary dyskinesia 04/08/2014  . Abdominal pain, epigastric 04/08/2014  . Pancreatitis, acute 03/12/2014  . Pancreatitis 03/12/2014  . Type 2 diabetes mellitus ([place]) 06/25/2012  . ACUTE BRONCHITIS 03/11/2010  . 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 SINUSITIS, ACUTE 06/11/2009  . Hyperlipidemia 01/26/2009  . Essential hypertension 01/26/2009   Past Medical History:  Diagnosis Date  . GERD (gastroesophageal reflux disease)   . HYPERLIPIDEMIA 01/26/2009  . HYPERTENSION 01/26/2009  . 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  lower back pain, s/p cervical neck surgery"stiffness" remains  . Transfusion history    infant "anemia"    Family History  Problem Relation Age of Onset  . Hypertension Mother   . Hypertension Father   . Arthritis Sister     rheumatoid  . Stroke Sister 89    Past Surgical History:  Procedure Laterality Date  . APPENDECTOMY  ~ 1959  . BACK SURGERY    . CERVICAL LAMINECTOMY  1997   C 4 and C 5  . CHOLECYSTECTOMY N/A 04/15/2014   Procedure: LAPAROSCOPIC CHOLECYSTECTOMY WITH INTRAOPERATIVE CHOLANGIOGRAM;  Surgeon: Armandina Gemma, MD;  Location: [facility];  Service: General;  Laterality: N/A;  . ESOPHAGOGASTRODUODENOSCOPY N/A 03/13/2014   Procedure: ESOPHAGOGASTRODUODENOSCOPY (EGD);  Surgeon: Wonda Horner, MD;  Location: [facility] ENDOSCOPY;  Service: Endoscopy;  Laterality: N/A;  . 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  . SHOULDER ARTHROSCOPY W/ ROTATOR CUFF REPAIR Bilateral 2004-2009   left 2004, right 2009  . VASECTOMY     Social History   Occupational History  . Not on file.   Social History Main Topics  . Smoking status: Former Smoker    Packs/day: 1.00    Years: 5.00    Types: Cigarettes    Quit date: 10/17/1968  . Smokeless tobacco: Never Used  . Alcohol use Yes     Comment: "quit drinking ~ 10/1968  . Drug use: No  . Sexual activity: Not Currently

## 2016-09-20 NOTE — Progress Notes (Deleted)
Office Visit Note   Patient: Luis Guzman           Date of Birth: Jul 26, 1946           MRN: [id number] Visit Date: 09/20/2016              Requested by: Eulas Post, MD [place], [place] [address] PCP: Eulas Post, MD   Assessment & Plan: Visit Diagnoses:  1. Chronic right shoulder pain     Plan: ***  Follow-Up Instructions: Return if symptoms worsen or fail to improve.   Orders:  Orders Placed This Encounter  Procedures  . Large Joint Injection/Arthrocentesis   No orders of the defined types were placed in this encounter.     Procedures: No procedures performed   Clinical Data: No additional findings.   Subjective: Chief Complaint  Patient presents with  . Right Shoulder - Pain    Last injection 04/27/16. Right Shoulder:  Had positive impingement but was able to raise his arm over his head and fairly quickly.  There was no numbness.  He does have some anterior shoulder pain beneath the acromion, none posteriorly.  Minimally positive impingement and no loss of motion.       Review of Systems   Objective: Vital Signs: BP (!) 157/78   Pulse (!) 57   Ht 6' (1.829 m)   Wt 193 lb (87.5 kg)   BMI 26.18 kg/m   Physical Exam  Ortho Exam  Specialty Comments:  No specialty comments available.  Imaging: No results found.   PMFS History: Patient Active Problem List   Diagnosis Date Noted  . AAA (abdominal aortic aneurysm) without rupture ([place]) 04/15/2016  . Biliary dyskinesia 04/08/2014  . Abdominal pain, epigastric 04/08/2014  . Pancreatitis, acute 03/12/2014  . Pancreatitis 03/12/2014  . Type 2 diabetes mellitus ([place]) 06/25/2012  . ACUTE BRONCHITIS 03/11/2010  . 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 SINUSITIS, ACUTE 06/11/2009  . Hyperlipidemia 01/26/2009  . Essential hypertension 01/26/2009   Past Medical History:  Diagnosis Date  . GERD (gastroesophageal reflux disease)   .  HYPERLIPIDEMIA 01/26/2009  . HYPERTENSION 01/26/2009  . 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 lower back pain, s/p cervical neck surgery"stiffness" remains  . Transfusion history    infant "anemia"    Family History  Problem Relation Age of Onset  . Hypertension Mother   . Hypertension Father   . Arthritis Sister     rheumatoid  . Stroke Sister 23    Past Surgical History:  Procedure Laterality Date  . APPENDECTOMY  ~ 1959  . BACK SURGERY    . CERVICAL LAMINECTOMY  1997   C 4 and C 5  . CHOLECYSTECTOMY N/A 04/15/2014   Procedure: LAPAROSCOPIC CHOLECYSTECTOMY WITH INTRAOPERATIVE CHOLANGIOGRAM;  Surgeon: Armandina Gemma, MD;  Location: [facility];  Service: General;  Laterality: N/A;  . ESOPHAGOGASTRODUODENOSCOPY N/A 03/13/2014   Procedure: ESOPHAGOGASTRODUODENOSCOPY (EGD);  Surgeon: Wonda Horner, MD;  Location[facility] ENDOSCOPY;  Service: Endoscopy;  Laterality: N/A;  . 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  . SHOULDER ARTHROSCOPY W/ ROTATOR CUFF REPAIR Bilateral 2004-2009   left 2004, right 2009  . VASECTOMY     Social History   Occupational History  . Not on file.   Social History Main Topics  . Smoking status: Former Smoker    Packs/day: 1.00    Years: 5.00    Types: Cigarettes    Quit date: 10/17/1968  .  Smokeless tobacco: Never Used  . Alcohol use Yes     Comment: "quit drinking ~ 10/1968  . Drug use: No  . Sexual activity: Not Currently

## 2016-09-20 NOTE — Progress Notes (Signed)
Office Visit Note   Patient: Luis Guzman           Date of Birth: [birth year]           MRN: [id number] Visit Date: 09/20/2016              Requested by: Eulas Post, MD [place], [place] [address] PCP: Eulas Post, MD   Assessment & Plan: Visit Diagnoses:  1. Chronic right shoulder pain   Luis Guzman has had a prior MRI scan demonstrating a small tear of the supraspinatus .[name] He's done well with cortisone in the past.  Plan: Discussion regarding different treatment options for these small rotator cuff tear. He would prefer to have another cortisone injection  Follow-Up Instructions: Return if symptoms worsen or fail to improve.   Orders:  No orders of the defined types were placed in this encounter.  No orders of the defined types were placed in this encounter.     Procedures: Large Joint Inj Date/Time: 09/20/2016 10:51 AM Performed by: Garald Balding Authorized by: Garald Balding   Consent Given by:  Patient Timeout: prior to procedure the correct patient, procedure, and site was verified   Indications:  Pain Location:  Shoulder Site:  L subacromial bursa Prep: patient was prepped and draped in usual sterile fashion   Needle Size:  25 G Needle Length:  1.5 inches Approach:  Lateral Ultrasound Guidance: No   Fluoroscopic Guidance: No   Arthrogram: No   Medications:  80 mg methylPREDNISolone acetate 40 MG/ML; 2 mL lidocaine 1 %; 2 mL bupivacaine 0.5 % Aspiration Attempted: No   Patient tolerance:  Patient tolerated the procedure well with no immediate complications     Clinical Data: No additional findings.   Subjective: Chief Complaint  Patient presents with  . Right Shoulder - Pain    HPI Sabastain has been seen on several occasions in the past for the problem with his right shoulder. He had a subacromial cortisone injection in September which is "lasted until just recently". Her MRI scan demonstrating a significant  partial-thickness and possibly full-thickness tear of the supraspinatus. He's having recurrent pain to the point was having difficulty raising his arm over his head without pain and sleeping. He denies any recent injury or trauma. Review of Systems   Objective: Vital Signs: BP (!) 157/78   Pulse (!) 57   Ht 6' (1.829 m)   Wt 193 lb (87.5 kg)   BMI 26.18 kg/m   Physical Exam  Ortho Exam right shoulder exam with positive impingement. Able to place the right arm fully overhead and flexion but with a painful arc. Some pain in the anterior lateral subacromial region with abduction. Biceps intact. Skin intact. Neurovascular exam intact. He notes.  Specialty Comments:  No specialty comments available.  Imaging: No results found.   PMFS History: Patient Active Problem List   Diagnosis Date Noted  . AAA (abdominal aortic aneurysm) without rupture ([place]) 04/15/2016  . Biliary dyskinesia 04/08/2014  . Abdominal pain, epigastric 04/08/2014  . Pancreatitis, acute 03/12/2014  . Pancreatitis 03/12/2014  . Type 2 diabetes mellitus ([place]) 06/25/2012  . ACUTE BRONCHITIS 03/11/2010  . 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 SINUSITIS, ACUTE 06/11/2009  . Hyperlipidemia 01/26/2009  . Essential hypertension 01/26/2009   Past Medical History:  Diagnosis Date  . GERD (gastroesophageal reflux disease)   . HYPERLIPIDEMIA 01/26/2009  . HYPERTENSION 01/26/2009  . 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 lower  back pain, s/p cervical neck surgery"stiffness" remains  . Transfusion history    infant "anemia"    Family History  Problem Relation Age of Onset  . Hypertension Mother   . Hypertension Father   . Arthritis Sister     rheumatoid  . Stroke Sister 7    Past Surgical History:  Procedure Laterality Date  . APPENDECTOMY  ~ 1959  . BACK SURGERY    . CERVICAL LAMINECTOMY  1997   C 4 and C 5  . CHOLECYSTECTOMY N/A 04/15/2014   Procedure:  LAPAROSCOPIC CHOLECYSTECTOMY WITH INTRAOPERATIVE CHOLANGIOGRAM;  Surgeon: Armandina Gemma, MD;  Location: [facility];  Service: General;  Laterality: N/A;  . ESOPHAGOGASTRODUODENOSCOPY N/A 03/13/2014   Procedure: ESOPHAGOGASTRODUODENOSCOPY (EGD);  Surgeon: Wonda Horner, MD;  Location: [facility] ENDOSCOPY;  Service: Endoscopy;  Laterality: N/A;  . 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  . SHOULDER ARTHROSCOPY W/ ROTATOR CUFF REPAIR Bilateral 2004-2009   left 2004, right 2009  . VASECTOMY     Social History   Occupational History  . Not on file.   Social History Main Topics  . Smoking status: Former Smoker    Packs/day: 1.00    Years: 5.00    Types: Cigarettes    Quit date: 10/17/1968  . Smokeless tobacco: Never Used  . Alcohol use Yes     Comment: "quit drinking ~ 10/1968  . Drug use: No  . Sexual activity: Not Currently

## 2016-10-20 DIAGNOSIS — M5412 Radiculopathy, cervical region: Secondary | ICD-10-CM | POA: Diagnosis not present

## 2016-12-14 DIAGNOSIS — H401121 Primary open-angle glaucoma, left eye, mild stage: Secondary | ICD-10-CM | POA: Diagnosis not present

## 2016-12-22 DIAGNOSIS — I10 Essential (primary) hypertension: Secondary | ICD-10-CM | POA: Diagnosis not present

## 2016-12-22 DIAGNOSIS — Z6826 Body mass index (BMI) 26.0-26.9, adult: Secondary | ICD-10-CM | POA: Diagnosis not present

## 2016-12-22 DIAGNOSIS — M542 Cervicalgia: Secondary | ICD-10-CM | POA: Diagnosis not present

## 2017-01-16 ENCOUNTER — Encounter (INDEPENDENT_AMBULATORY_CARE_PROVIDER_SITE_OTHER): Payer: Self-pay | Admitting: Orthopaedic Surgery

## 2017-01-16 ENCOUNTER — Ambulatory Visit (INDEPENDENT_AMBULATORY_CARE_PROVIDER_SITE_OTHER): Payer: Medicare Other | Admitting: Orthopaedic Surgery

## 2017-01-16 VITALS — BP 134/73 | HR 60 | Resp 14 | Ht 72.0 in | Wt 185.0 lb

## 2017-01-16 DIAGNOSIS — M25511 Pain in right shoulder: Secondary | ICD-10-CM

## 2017-01-16 DIAGNOSIS — G8929 Other chronic pain: Secondary | ICD-10-CM

## 2017-01-16 MED ORDER — LIDOCAINE HCL 1 % IJ SOLN
2.0000 mL | INTRAMUSCULAR | Status: AC | PRN
  Administered 2017-01-16: 2 mL

## 2017-01-16 MED ORDER — METHYLPREDNISOLONE ACETATE 40 MG/ML IJ SUSP
80.0000 mg | INTRAMUSCULAR | Status: AC | PRN
  Administered 2017-01-16: 80 mg

## 2017-01-16 MED ORDER — BUPIVACAINE HCL 0.5 % IJ SOLN
2.0000 mL | INTRAMUSCULAR | Status: AC | PRN
  Administered 2017-01-16: 2 mL via INTRA_ARTICULAR

## 2017-01-16 NOTE — Progress Notes (Signed)
Office Visit Note   Patient: Luis Guzman           Date of Birth: 04/26/1946           MRN: [id number] Visit Date: 01/16/2017              Requested by: Eulas Post, MD [place], [place] [address] PCP: Eulas Post, MD   Assessment & Plan: Visit Diagnoses:  1. 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 MRI scan in September 2017 with recurrent pain. Initially did well with subacromial cortisone injection Plan: Long discussion regarding prior MRI scan findings. I have had long discussion regarding arthroscopic subacromial decompression and distal clavicle resection as well as a mini open rotator cuff tear repair. Inject subacromial space  Follow-Up Instructions: Return if symptoms worsen or fail to improve.   Orders:  No orders of the defined types were placed in this encounter.  No orders of the defined types were placed in this encounter.     Procedures: Large Joint Inj Date/Time: 01/16/2017 9:00 AM Performed by: Garald Balding Authorized by: Garald Balding   Consent Given by:  Patient Timeout: prior to procedure the correct patient, procedure, and site was verified   Indications:  Pain Location:  Shoulder Site:  L subacromial bursa Prep: patient was prepped and draped in usual sterile fashion   Needle Size:  25 G Needle Length:  1.5 inches Approach:  Lateral Ultrasound Guidance: No   Fluoroscopic Guidance: No   Arthrogram: No   Medications:  80 mg methylPREDNISolone acetate 40 MG/ML; 2 mL lidocaine 1 %; 2 mL bupivacaine 0.5 % Aspiration Attempted: No   Patient tolerance:  Patient tolerated the procedure well with no immediate complications     Clinical Data: No additional findings.   Subjective: Chief Complaint  Patient presents with  . Right Shoulder - Follow-up    Chronic right shoulder pain  Hieu has had a prior MRI scan demonstrating a small tear of the supraspinatus. The  injection worked for about 3 months. Wants another today    Did well with cortisone injection 3 months ago and to have it slowly recur. Prior MRI scan demonstrated a high-grade partial tear of the supraspinatus tendon with possible full-thickness tear. He's having pain with overhead motion and lifting.  HPI  Review of Systems   Objective: Vital Signs: BP 134/73   Pulse 60   Resp 14   Ht 6' (1.829 m)   Wt 185 lb (83.9 kg)   BMI 25.09 kg/m   Physical Exam  Ortho Exam right shoulder exam with circuitous arc of overhead motion related to pain. Positive impingement. Positive empty can testing. Neurovascular exam intact. Biceps intact. Good strength. Local tenderness directly over the aspect of the acromion  Specialty Comments:  No specialty comments available.  Imaging: No results found.   PMFS History: Patient Active Problem List   Diagnosis Date Noted  . AAA (abdominal aortic aneurysm) without rupture ([place]) 04/15/2016  . Biliary dyskinesia 04/08/2014  . Abdominal pain, epigastric 04/08/2014  . Pancreatitis, acute 03/12/2014  . Pancreatitis 03/12/2014  . Type 2 diabetes mellitus ([place]) 06/25/2012  . ACUTE BRONCHITIS 03/11/2010  . 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 SINUSITIS, ACUTE 06/11/2009  . Hyperlipidemia 01/26/2009  . Essential hypertension 01/26/2009   Past Medical History:  Diagnosis Date  . GERD (gastroesophageal reflux disease)   . HYPERLIPIDEMIA 01/26/2009  . HYPERTENSION 01/26/2009  .  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 lower back pain, s/p cervical neck surgery"stiffness" remains  . Transfusion history    infant "anemia"    Family History  Problem Relation Age of Onset  . Hypertension Mother   . Hypertension Father   . Arthritis Sister        rheumatoid  . Stroke Sister 66    Past Surgical History:  Procedure Laterality Date  . APPENDECTOMY  ~ 1959  . BACK SURGERY    . CERVICAL LAMINECTOMY  1997     C 4 and C 5  . CHOLECYSTECTOMY N/A 04/15/2014   Procedure: LAPAROSCOPIC CHOLECYSTECTOMY WITH INTRAOPERATIVE CHOLANGIOGRAM;  Surgeon: Armandina Gemma, MD;  Location: [facility];  Service: General;  Laterality: N/A;  . ESOPHAGOGASTRODUODENOSCOPY N/A 03/13/2014   Procedure: ESOPHAGOGASTRODUODENOSCOPY (EGD);  Surgeon: Wonda Horner, MD;  Location: [facility] ENDOSCOPY;  Service: Endoscopy;  Laterality: N/A;  . 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  . SHOULDER ARTHROSCOPY W/ ROTATOR CUFF REPAIR Bilateral 2004-2009   left 2004, right 2009  . VASECTOMY     Social History   Occupational History  . Not on file.   Social History Main Topics  . Smoking status: Former Smoker    Packs/day: 1.00    Years: 5.00    Types: Cigarettes    Quit date: 10/17/1968  . Smokeless tobacco: Never Used  . Alcohol use Yes     Comment: "quit drinking ~ 10/1968  . Drug use: No  . Sexual activity: Not Currently     Garald Balding, MD   Note - This record has been created using [employer].  Chart creation errors have been sought, but may not always  have been located. Such creation errors do not reflect on  the standard of medical care.

## 2017-01-20 ENCOUNTER — Ambulatory Visit (INDEPENDENT_AMBULATORY_CARE_PROVIDER_SITE_OTHER): Payer: Medicare Other | Admitting: Family Medicine

## 2017-01-20 ENCOUNTER — Encounter: Payer: Self-pay | Admitting: Family Medicine

## 2017-01-20 VITALS — BP 138/82 | HR 73 | Temp 98.2°F | Ht 72.0 in | Wt 194.1 lb

## 2017-01-20 DIAGNOSIS — J0100 Acute maxillary sinusitis, unspecified: Secondary | ICD-10-CM | POA: Diagnosis not present

## 2017-01-20 MED ORDER — DOXYCYCLINE HYCLATE 100 MG PO TABS: 100.0000 mg | ORAL_TABLET | Freq: Two times a day (BID) | ORAL | 0 refills | Status: DC

## 2017-01-20 NOTE — Progress Notes (Signed)
HPI:  Acute visit for:  Sinus congestion: -started 2 weeks ago with a cold -symptoms include thick nasal congestion - now yellow, upper tooth pain and sinus discomfort, drainage in throat and cough, sub low grade temp yesterday -denies: SOB, wheezing, hemoptysis, NVD, body aches, rashes, sick contacts, tick bites -reports diabetes is well controlled  ROS: See pertinent positives and negatives per HPI.  Past Medical History:  Diagnosis Date  . GERD (gastroesophageal reflux disease)   . HYPERLIPIDEMIA 01/26/2009  . HYPERTENSION 01/26/2009  . 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 lower back pain, s/p cervical neck surgery"stiffness" remains  . Transfusion history    infant "anemia"    Past Surgical History:  Procedure Laterality Date  . APPENDECTOMY  ~ 1959  . BACK SURGERY    . CERVICAL LAMINECTOMY  1997   C 4 and C 5  . CHOLECYSTECTOMY N/A 04/15/2014   Procedure: LAPAROSCOPIC CHOLECYSTECTOMY WITH INTRAOPERATIVE CHOLANGIOGRAM;  Surgeon: Armandina Gemma, MD;  Location: [facility];  Service: General;  Laterality: N/A;  . ESOPHAGOGASTRODUODENOSCOPY N/A 03/13/2014   Procedure: ESOPHAGOGASTRODUODENOSCOPY (EGD);  Surgeon: Wonda Horner, MD;  Location: [facility] ENDOSCOPY;  Service: Endoscopy;  Laterality: N/A;  . 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  . SHOULDER ARTHROSCOPY W/ ROTATOR CUFF REPAIR Bilateral 2004-2009   left 2004, right 2009  . VASECTOMY      Family History  Problem Relation Age of Onset  . Hypertension Mother   . Hypertension Father   . Arthritis Sister        rheumatoid  . Stroke Sister 28    Social History   Social History  . Marital status: Married    Spouse name: N/A  . Number of children: N/A  . Years of education: N/A   Social History Main Topics  . Smoking status: Former Smoker    Packs/day: 1.00    Years: 5.00    Types: Cigarettes    Quit date: 10/17/1968  . Smokeless tobacco: Never Used  . Alcohol use Yes   Comment: "quit drinking ~ 10/1968  . Drug use: No  . Sexual activity: Not Currently   Other Topics Concern  . None   Social History Narrative  . None     Current Outpatient Prescriptions:  .  hydrochlorothiazide (MICROZIDE) 12.5 MG capsule, Take 1 capsule (12.5 mg total) by mouth daily., Disp: 90 capsule, Rfl: 3 .  losartan (COZAAR) 100 MG tablet, Take 1 tablet (100 mg total) by mouth daily., Disp: 90 tablet, Rfl: 3 .  metoprolol succinate (TOPROL-XL) 50 MG 24 hr tablet, Take 1 tablet (50 mg total) by mouth daily. Take with or immediately following a meal., Disp: 90 tablet, Rfl: 3 .  oxyCODONE-acetaminophen (PERCOCET) 10-325 MG per tablet, Take 1 tablet by mouth every 6 (six) hours as needed for pain., Disp: , Rfl:  .  simethicone (MYLICON) 80 MG chewable tablet, Chew 1 tablet (80 mg total) by mouth 4 (four) times daily as needed for flatulence (gas and abdominal distension)., Disp: 90 tablet, Rfl: 3 .  doxycycline (VIBRA-TABS) 100 MG tablet, Take 1 tablet (100 mg total) by mouth 2 (two) times daily., Disp: 14 tablet, Rfl: 0  EXAM:  Vitals:   01/20/17 0932  BP: 138/82  Pulse: 73  Temp: 98.2 F (36.8 C)    Body mass index is 26.32 kg/m.  GENERAL: vitals reviewed and listed above, alert, oriented, appears well hydrated and in no acute distress  HEENT: atraumatic, conjunttiva clear, no obvious abnormalities  on inspection of external nose and ears, normal appearance of ear canals and TMs, thick nasal congestion, mild post oropharyngeal erythema with PND, no tonsillar edema or exudate, no sinus TTP  NECK: no obvious masses on inspection  LUNGS: clear to auscultation bilaterally, no wheezes, rales or rhonchi, good air movement  CV: HRRR, no peripheral edema  MS: moves all extremities without noticeable abnormality  PSYCH: pleasant and cooperative, no obvious depression or anxiety  ASSESSMENT AND PLAN:  Discussed the following assessment and plan:  Acute non-recurrent  maxillary sinusitis  -we discussed possible serious and likely etiologies, workup and treatment, treatment risks and return precautions -after this discussion, Luis Guzman opted for doxycycline given allergies, aftrin nasal spray short course for pnd - understands risks and not to use longer then 3-4 days -follow up advised as needed -of course, we advised Luis Guzman  to return or notify a doctor immediately if symptoms worsen or persist or new concerns arise.   Patient Instructions  Take the antibiotic as instructed. Stay out of the sun while taking and follow pharmacy instructions.  Afrin nasal spray for 4 days can help with symptoms - do not use longer then 4 days and follow instructions.  I hope you are feeling better soon! Seek care immediately if worsening, new concerns or you are not improving with treatment.     Colin Benton R., DO

## 2017-01-20 NOTE — Patient Instructions (Signed)
Take the antibiotic as instructed. Stay out of the sun while taking and follow pharmacy instructions.  Afrin nasal spray for 4 days can help with symptoms - do not use longer then 4 days and follow instructions.  I hope you are feeling better soon! Seek care immediately if worsening, new concerns or you are not improving with treatment.

## 2017-01-23 ENCOUNTER — Telehealth: Payer: Self-pay | Admitting: Family Medicine

## 2017-01-23 NOTE — Telephone Encounter (Signed)
Pt saw dr Maudie Mercury on 01-20-17 for sinus infection was given rx doxycycline and is having side effects. Pt is having nausea and not able to sleep at night.pt is taking med with food. [place] [facility] [place] phone # [phone]. Pt would like callback

## 2017-01-23 NOTE — Telephone Encounter (Signed)
He has allergy to cephalosporin and penicillin. Discontinue doxycycline. Start Levaquin 500 mg once daily for 7 days

## 2017-01-24 MED ORDER — LEVOFLOXACIN 500 MG PO TABS: 500.0000 mg | ORAL_TABLET | Freq: Every day | ORAL | 0 refills | Status: DC

## 2017-01-24 NOTE — Telephone Encounter (Signed)
Patient is aware. rx sent. Medication list updated.

## 2017-01-31 ENCOUNTER — Telehealth (INDEPENDENT_AMBULATORY_CARE_PROVIDER_SITE_OTHER): Payer: Self-pay | Admitting: Orthopaedic Surgery

## 2017-01-31 NOTE — Telephone Encounter (Signed)
Patient called to let Dr. Durward Fortes know that he is ready to schedule surgery on his right rotator cuff.  CB#[phone].  Thank you.

## 2017-01-31 NOTE — Telephone Encounter (Signed)
Please advise 

## 2017-02-01 NOTE — Telephone Encounter (Signed)
Ss faxed from [facility] office

## 2017-02-01 NOTE — Telephone Encounter (Signed)
Will complete surgery sheet

## 2017-02-02 DIAGNOSIS — M47812 Spondylosis without myelopathy or radiculopathy, cervical region: Secondary | ICD-10-CM | POA: Diagnosis not present

## 2017-02-14 ENCOUNTER — Encounter (INDEPENDENT_AMBULATORY_CARE_PROVIDER_SITE_OTHER): Payer: Self-pay | Admitting: Orthopaedic Surgery

## 2017-02-16 DIAGNOSIS — M47812 Spondylosis without myelopathy or radiculopathy, cervical region: Secondary | ICD-10-CM | POA: Diagnosis not present

## 2017-02-23 ENCOUNTER — Encounter: Payer: Self-pay | Admitting: Orthopaedic Surgery

## 2017-02-23 DIAGNOSIS — M7541 Impingement syndrome of right shoulder: Secondary | ICD-10-CM | POA: Diagnosis not present

## 2017-02-23 DIAGNOSIS — M75121 Complete rotator cuff tear or rupture of right shoulder, not specified as traumatic: Secondary | ICD-10-CM | POA: Diagnosis not present

## 2017-02-23 DIAGNOSIS — S43431D Superior glenoid labrum lesion of right shoulder, subsequent encounter: Secondary | ICD-10-CM | POA: Diagnosis not present

## 2017-02-23 DIAGNOSIS — M75111 Incomplete rotator cuff tear or rupture of right shoulder, not specified as traumatic: Secondary | ICD-10-CM | POA: Diagnosis not present

## 2017-02-23 DIAGNOSIS — M19011 Primary osteoarthritis, right shoulder: Secondary | ICD-10-CM | POA: Diagnosis not present

## 2017-02-23 DIAGNOSIS — M24111 Other articular cartilage disorders, right shoulder: Secondary | ICD-10-CM | POA: Diagnosis not present

## 2017-02-23 DIAGNOSIS — G8918 Other acute postprocedural pain: Secondary | ICD-10-CM | POA: Diagnosis not present

## 2017-02-27 ENCOUNTER — Inpatient Hospital Stay (INDEPENDENT_AMBULATORY_CARE_PROVIDER_SITE_OTHER): Payer: Medicare Other | Admitting: Orthopaedic Surgery

## 2017-02-27 ENCOUNTER — Ambulatory Visit (INDEPENDENT_AMBULATORY_CARE_PROVIDER_SITE_OTHER): Payer: Medicare Other | Admitting: Orthopaedic Surgery

## 2017-02-27 VITALS — BP 127/83 | HR 77 | Ht 72.0 in | Wt 185.0 lb

## 2017-02-27 DIAGNOSIS — M25511 Pain in right shoulder: Secondary | ICD-10-CM

## 2017-02-27 DIAGNOSIS — G8929 Other chronic pain: Secondary | ICD-10-CM

## 2017-02-27 NOTE — Progress Notes (Signed)
   Post-Op Visit Note   Patient: Luis Guzman           Date of Birth: December 04, 1945           MRN: [id number] Visit Date: 02/27/2017 PCP: Eulas Post, MD   Assessment & Plan:  Chief Complaint:  Chief Complaint  Patient presents with  . Right Shoulder - Routine Post Op    Mr. Priest is 4 days status post Right shoulder arthroscopy. He relates only taking pain meds at night, sleeping very well.   Visit Diagnoses:  1. Chronic right shoulder pain   4 days status post arthroscopic subacromial decompression and distal clavicle resection right shoulder. Mini open rotator cuff tear repair of supraspinatus tendon. Doing well with minimal pain  Plan: Start physical therapy next week. Circumduction exercises beginning today. May shower. Continue with the sling. Office 2 weeks  Follow-Up Instructions: Return in about 2 weeks (around 03/13/2017).   Orders:  No orders of the defined types were placed in this encounter.  No orders of the defined types were placed in this encounter.   Imaging: No results found.  PMFS History: Patient Active Problem List   Diagnosis Date Noted  . AAA (abdominal aortic aneurysm) without rupture ([place]) 04/15/2016  . Biliary dyskinesia 04/08/2014  . Abdominal pain, epigastric 04/08/2014  . Pancreatitis, acute 03/12/2014  . Pancreatitis 03/12/2014  . Type 2 diabetes mellitus ([place]) 06/25/2012  . ACUTE BRONCHITIS 03/11/2010  . 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 SINUSITIS, ACUTE 06/11/2009  . Hyperlipidemia 01/26/2009  . Essential hypertension 01/26/2009   Past Medical History:  Diagnosis Date  . GERD (gastroesophageal reflux disease)   . HYPERLIPIDEMIA 01/26/2009  . HYPERTENSION 01/26/2009  . 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 lower back pain, s/p cervical neck surgery"stiffness" remains  . Transfusion history    infant "anemia"    Family History  Problem Relation Age of Onset  . Hypertension Mother    . Hypertension Father   . Arthritis Sister        rheumatoid  . Stroke Sister 35    Past Surgical History:  Procedure Laterality Date  . APPENDECTOMY  ~ 1959  . BACK SURGERY    . CERVICAL LAMINECTOMY  1997   C 4 and C 5  . CHOLECYSTECTOMY N/A 04/15/2014   Procedure: LAPAROSCOPIC CHOLECYSTECTOMY WITH INTRAOPERATIVE CHOLANGIOGRAM;  Surgeon: Armandina Gemma, MD;  Location: [facility];  Service: General;  Laterality: N/A;  . ESOPHAGOGASTRODUODENOSCOPY N/A 03/13/2014   Procedure: ESOPHAGOGASTRODUODENOSCOPY (EGD);  Surgeon: Wonda Horner, MD;  Location: [facility] ENDOSCOPY;  Service: Endoscopy;  Laterality: N/A;  . 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  . SHOULDER ARTHROSCOPY W/ ROTATOR CUFF REPAIR Bilateral 2004-2009   left 2004, right 2009  . VASECTOMY     Social History   Occupational History  . Not on file.   Social History Main Topics  . Smoking status: Former Smoker    Packs/day: 1.00    Years: 5.00    Types: Cigarettes    Quit date: 10/17/1968  . Smokeless tobacco: Never Used  . Alcohol use Yes     Comment: "quit drinking ~ 10/1968  . Drug use: No  . Sexual activity: Not Currently

## 2017-03-01 ENCOUNTER — Inpatient Hospital Stay (INDEPENDENT_AMBULATORY_CARE_PROVIDER_SITE_OTHER): Payer: Medicare Other | Admitting: Orthopaedic Surgery

## 2017-03-06 DIAGNOSIS — M25611 Stiffness of right shoulder, not elsewhere classified: Secondary | ICD-10-CM | POA: Diagnosis not present

## 2017-03-06 DIAGNOSIS — R531 Weakness: Secondary | ICD-10-CM | POA: Diagnosis not present

## 2017-03-06 DIAGNOSIS — M25511 Pain in right shoulder: Secondary | ICD-10-CM | POA: Diagnosis not present

## 2017-03-06 DIAGNOSIS — M75101 Unspecified rotator cuff tear or rupture of right shoulder, not specified as traumatic: Secondary | ICD-10-CM | POA: Diagnosis not present

## 2017-03-09 DIAGNOSIS — M75101 Unspecified rotator cuff tear or rupture of right shoulder, not specified as traumatic: Secondary | ICD-10-CM | POA: Diagnosis not present

## 2017-03-09 DIAGNOSIS — M25611 Stiffness of right shoulder, not elsewhere classified: Secondary | ICD-10-CM | POA: Diagnosis not present

## 2017-03-09 DIAGNOSIS — M25511 Pain in right shoulder: Secondary | ICD-10-CM | POA: Diagnosis not present

## 2017-03-09 DIAGNOSIS — R531 Weakness: Secondary | ICD-10-CM | POA: Diagnosis not present

## 2017-03-10 ENCOUNTER — Telehealth (INDEPENDENT_AMBULATORY_CARE_PROVIDER_SITE_OTHER): Payer: Self-pay | Admitting: Orthopaedic Surgery

## 2017-03-10 NOTE — Telephone Encounter (Signed)
PT Initial evaluation faxed 03/10/17

## 2017-03-13 ENCOUNTER — Other Ambulatory Visit (INDEPENDENT_AMBULATORY_CARE_PROVIDER_SITE_OTHER): Payer: Medicare Other

## 2017-03-13 ENCOUNTER — Encounter (INDEPENDENT_AMBULATORY_CARE_PROVIDER_SITE_OTHER): Payer: Self-pay | Admitting: Orthopaedic Surgery

## 2017-03-13 ENCOUNTER — Ambulatory Visit (INDEPENDENT_AMBULATORY_CARE_PROVIDER_SITE_OTHER): Payer: Medicare Other | Admitting: Orthopaedic Surgery

## 2017-03-13 ENCOUNTER — Telehealth: Payer: Self-pay | Admitting: Family Medicine

## 2017-03-13 VITALS — BP 103/69 | HR 66 | Resp 14 | Ht 72.0 in | Wt 185.0 lb

## 2017-03-13 DIAGNOSIS — C61 Malignant neoplasm of prostate: Secondary | ICD-10-CM

## 2017-03-13 DIAGNOSIS — M25511 Pain in right shoulder: Secondary | ICD-10-CM

## 2017-03-13 DIAGNOSIS — R531 Weakness: Secondary | ICD-10-CM | POA: Diagnosis not present

## 2017-03-13 DIAGNOSIS — M25611 Stiffness of right shoulder, not elsewhere classified: Secondary | ICD-10-CM | POA: Diagnosis not present

## 2017-03-13 DIAGNOSIS — G8929 Other chronic pain: Secondary | ICD-10-CM

## 2017-03-13 DIAGNOSIS — M75101 Unspecified rotator cuff tear or rupture of right shoulder, not specified as traumatic: Secondary | ICD-10-CM | POA: Diagnosis not present

## 2017-03-13 LAB — PSA: PSA: 4.31 ng/mL — ABNORMAL HIGH (ref 0.10–4.00)

## 2017-03-13 MED ORDER — DICLOFENAC SODIUM 1 % TD GEL: 2.0000 g | Freq: Four times a day (QID) | TRANSDERMAL | 1 refills | Status: DC

## 2017-03-13 NOTE — Telephone Encounter (Signed)
Okay to send in new rx?

## 2017-03-13 NOTE — Telephone Encounter (Signed)
OK to renew for one year.

## 2017-03-13 NOTE — Progress Notes (Signed)
Post-Op Visit Note   Patient: Luis Guzman           Date of Birth: 08-11-1946           MRN: [id number] Visit Date: 03/13/2017 PCP: Eulas Post, MD   Assessment & Plan:  Chief Complaint:  Chief Complaint  Patient presents with  . Right Shoulder - Routine Post Op    Mr. Baines is 2 1/2 weeks status post R shoulder arthroscopy. He relates his PT is 3 x week and going well. He continues with the sling and relates when moves in a certain area and is "kinked" it takes his breath.   Visit Diagnoses:  1. Chronic right shoulder pain    2-1/2 weeks status post rotator cuff tear repair right shoulder. Doing well except for an occasional painful click  Plan: Continue with sling, continue with physical therapy, office 2 weeks consider subacromial cortisone injection still symptomatic. Incisions are healing without evidence of infection. this edema. Neurovascular exam intact. I could reproduce a click with the arm in the impingement position  Follow-Up Instructions: Return in about 2 weeks (around 03/27/2017).   Orders:  No orders of the defined types were placed in this encounter.  Meds ordered this encounter  Medications  . diclofenac sodium (VOLTAREN) 1 % GEL    Sig: Apply 2 g topically 4 (four) times daily.    Dispense:  3 Tube    Refill:  1    Imaging: No results found.  PMFS History: Patient Active Problem List   Diagnosis Date Noted  . AAA (abdominal aortic aneurysm) without rupture ([place]) 04/15/2016  . Biliary dyskinesia 04/08/2014  . Abdominal pain, epigastric 04/08/2014  . Pancreatitis, acute 03/12/2014  . Pancreatitis 03/12/2014  . Type 2 diabetes mellitus ([place]) 06/25/2012  . ACUTE BRONCHITIS 03/11/2010  . 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 SINUSITIS, ACUTE 06/11/2009  . Hyperlipidemia 01/26/2009  . Essential hypertension 01/26/2009   Past Medical History:  Diagnosis Date  . GERD (gastroesophageal reflux disease)   . HYPERLIPIDEMIA  01/26/2009  . HYPERTENSION 01/26/2009  . 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 lower back pain, s/p cervical neck surgery"stiffness" remains  . Transfusion history    infant "anemia"    Family History  Problem Relation Age of Onset  . Hypertension Mother   . Hypertension Father   . Arthritis Sister        rheumatoid  . Stroke Sister 25    Past Surgical History:  Procedure Laterality Date  . APPENDECTOMY  ~ 1959  . BACK SURGERY    . CERVICAL LAMINECTOMY  1997   C 4 and C 5  . CHOLECYSTECTOMY N/A 04/15/2014   Procedure: LAPAROSCOPIC CHOLECYSTECTOMY WITH INTRAOPERATIVE CHOLANGIOGRAM;  Surgeon: Armandina Gemma, MD;  Location: [facility];  Service: General;  Laterality: N/A;  . ESOPHAGOGASTRODUODENOSCOPY N/A 03/13/2014   Procedure: ESOPHAGOGASTRODUODENOSCOPY (EGD);  Surgeon: Wonda Horner, MD;  Location[facility] END[facility]COPY;  Service: Endoscopy;  Laterality: N/A;  . 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  . SHOULDER ARTHROSCOPY W/ ROTATOR CUFF REPAIR Bilateral 2004-2009   left 2004, right 2009  . VASECTOMY     Social History   Occupational History  . Not on file.   Social History Main Topics  . Smoking status: Former Smoker    Packs/day: 1.00    Years: 5.00    Types: Cigarettes    Quit date: 10/17/1968  . Smokeless tobacco: Never Used  . Alcohol use Yes  Comment: "quit drinking ~ 10/1968  . Drug use: No  . Sexual activity: Not Currently

## 2017-03-13 NOTE — Telephone Encounter (Signed)
The patient would like to have a replacement medication sent in to replace his Rx that was recalled.  losartan (COZAAR) 100 MG tablet   sent to : [place], [place] [phone] (Phone) [phone] (Fax)

## 2017-03-14 MED ORDER — LOSARTAN POTASSIUM 100 MG PO TABS: 100.0000 mg | ORAL_TABLET | Freq: Every day | ORAL | 3 refills | Status: DC

## 2017-03-15 DIAGNOSIS — R531 Weakness: Secondary | ICD-10-CM | POA: Diagnosis not present

## 2017-03-15 DIAGNOSIS — M75101 Unspecified rotator cuff tear or rupture of right shoulder, not specified as traumatic: Secondary | ICD-10-CM | POA: Diagnosis not present

## 2017-03-15 DIAGNOSIS — M25611 Stiffness of right shoulder, not elsewhere classified: Secondary | ICD-10-CM | POA: Diagnosis not present

## 2017-03-15 DIAGNOSIS — M25511 Pain in right shoulder: Secondary | ICD-10-CM | POA: Diagnosis not present

## 2017-03-20 DIAGNOSIS — M75101 Unspecified rotator cuff tear or rupture of right shoulder, not specified as traumatic: Secondary | ICD-10-CM | POA: Diagnosis not present

## 2017-03-20 DIAGNOSIS — M25511 Pain in right shoulder: Secondary | ICD-10-CM | POA: Diagnosis not present

## 2017-03-20 DIAGNOSIS — M25611 Stiffness of right shoulder, not elsewhere classified: Secondary | ICD-10-CM | POA: Diagnosis not present

## 2017-03-20 DIAGNOSIS — R531 Weakness: Secondary | ICD-10-CM | POA: Diagnosis not present

## 2017-03-23 DIAGNOSIS — M75101 Unspecified rotator cuff tear or rupture of right shoulder, not specified as traumatic: Secondary | ICD-10-CM | POA: Diagnosis not present

## 2017-03-23 DIAGNOSIS — M25611 Stiffness of right shoulder, not elsewhere classified: Secondary | ICD-10-CM | POA: Diagnosis not present

## 2017-03-23 DIAGNOSIS — R531 Weakness: Secondary | ICD-10-CM | POA: Diagnosis not present

## 2017-03-23 DIAGNOSIS — M25511 Pain in right shoulder: Secondary | ICD-10-CM | POA: Diagnosis not present

## 2017-03-27 ENCOUNTER — Other Ambulatory Visit: Payer: Self-pay | Admitting: Family Medicine

## 2017-03-27 DIAGNOSIS — M47812 Spondylosis without myelopathy or radiculopathy, cervical region: Secondary | ICD-10-CM | POA: Diagnosis not present

## 2017-03-28 DIAGNOSIS — R531 Weakness: Secondary | ICD-10-CM | POA: Diagnosis not present

## 2017-03-28 DIAGNOSIS — K591 Functional diarrhea: Secondary | ICD-10-CM | POA: Diagnosis not present

## 2017-03-28 DIAGNOSIS — R110 Nausea: Secondary | ICD-10-CM | POA: Diagnosis not present

## 2017-03-28 DIAGNOSIS — M75101 Unspecified rotator cuff tear or rupture of right shoulder, not specified as traumatic: Secondary | ICD-10-CM | POA: Diagnosis not present

## 2017-03-28 DIAGNOSIS — M25611 Stiffness of right shoulder, not elsewhere classified: Secondary | ICD-10-CM | POA: Diagnosis not present

## 2017-03-28 DIAGNOSIS — M25511 Pain in right shoulder: Secondary | ICD-10-CM | POA: Diagnosis not present

## 2017-03-30 ENCOUNTER — Encounter (INDEPENDENT_AMBULATORY_CARE_PROVIDER_SITE_OTHER): Payer: Self-pay | Admitting: Orthopaedic Surgery

## 2017-03-30 ENCOUNTER — Ambulatory Visit (INDEPENDENT_AMBULATORY_CARE_PROVIDER_SITE_OTHER): Payer: Medicare Other | Admitting: Orthopaedic Surgery

## 2017-03-30 VITALS — BP 134/76 | HR 72 | Resp 14 | Ht 72.0 in | Wt 180.0 lb

## 2017-03-30 DIAGNOSIS — R531 Weakness: Secondary | ICD-10-CM | POA: Diagnosis not present

## 2017-03-30 DIAGNOSIS — M75101 Unspecified rotator cuff tear or rupture of right shoulder, not specified as traumatic: Secondary | ICD-10-CM | POA: Diagnosis not present

## 2017-03-30 DIAGNOSIS — G8929 Other chronic pain: Secondary | ICD-10-CM

## 2017-03-30 DIAGNOSIS — M25511 Pain in right shoulder: Secondary | ICD-10-CM | POA: Diagnosis not present

## 2017-03-30 DIAGNOSIS — M25611 Stiffness of right shoulder, not elsewhere classified: Secondary | ICD-10-CM | POA: Diagnosis not present

## 2017-03-30 NOTE — Progress Notes (Signed)
Office Visit Note   Patient: Luis Guzman           Date of Birth: [birth year]           MRN: [id number] Visit Date: 03/30/2017              Requested by: Eulas Post, MD [place], [place] [address] PCP: Eulas Post, MD   Assessment & Plan: Visit Diagnoses:  1. Chronic right shoulder pain   5 weeks status post rotator cuff tear repair right shoulder with NSAID and distal clavicle resection. Doing well with physical therapy. Continues with the sling  Plan: Wean from sling, continue physical therapy, office 3 weeks  Follow-Up Instructions: Return in about 3 weeks (around 04/20/2017).   Orders:  No orders of the defined types were placed in this encounter.  No orders of the defined types were placed in this encounter.     Procedures: No procedures performed   Clinical Data: No additional findings.   Subjective: Chief Complaint  Patient presents with  . Right Shoulder - Follow-up  Five-week status post rotator cuff tear repair of right shoulder with  subacromial decompression and distal clavicle resection. Progressing in physical therapy. Still using the sling. Not having nearly the popping clicking that he was having. He doesn't have any the preoperative pain. No numbness or tingling shortness of breath or chest pain  HPI  Review of Systems   Objective: Vital Signs: BP 134/76   Pulse 72   Resp 14   Ht 6' (1.829 m)   Wt 180 lb (81.6 kg)   BMI 24.41 kg/m   Physical Exam  Ortho Exam right shoulder incisions healing without problem. I'm able to passively abduct 90. Still little bit of a click in the subacromial space I was able to flex about 110-20. Skin intact. Neurovascular exam intact.  Specialty Comments:  No specialty comments available.  Imaging: No results found.   PMFS History: Patient Active Problem List   Diagnosis Date Noted  . AAA (abdominal aortic aneurysm) without rupture ([place]) 04/15/2016  . Biliary  dyskinesia 04/08/2014  . Abdominal pain, epigastric 04/08/2014  . Pancreatitis, acute 03/12/2014  . Pancreatitis 03/12/2014  . Type 2 diabetes mellitus ([place]) 06/25/2012  . ACUTE BRONCHITIS 03/11/2010  . 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 SINUSITIS, ACUTE 06/11/2009  . Hyperlipidemia 01/26/2009  . Essential hypertension 01/26/2009   Past Medical History:  Diagnosis Date  . GERD (gastroesophageal reflux disease)   . HYPERLIPIDEMIA 01/26/2009  . HYPERTENSION 01/26/2009  . 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 lower back pain, s/p cervical neck surgery"stiffness" remains  . Transfusion history    infant "anemia"    Family History  Problem Relation Age of Onset  . Hypertension Mother   . Hypertension Father   . Arthritis Sister        rheumatoid  . Stroke Sister 60    Past Surgical History:  Procedure Laterality Date  . APPENDECTOMY  ~ 1959  . BACK SURGERY    . CERVICAL LAMINECTOMY  1997   C 4 and C 5  . CHOLECYSTECTOMY N/A 04/15/2014   Procedure: LAPAROSCOPIC CHOLECYSTECTOMY WITH INTRAOPERATIVE CHOLANGIOGRAM;  Surgeon: Armandina Gemma, MD;  Location: [facility];  Service: General;  Laterality: N/A;  . ESOPHAGOGASTRODUODENOSCOPY N/A 03/13/2014   Procedure: ESOPHAGOGASTRODUODENOSCOPY (EGD);  Surgeon: Wonda Horner, MD;  Location[facility] ENDOSCOPY;  Service: Endoscopy;  Laterality: N/A;  . ORIF FIBULA FRACTURE Left 09/2008   compartment  syndrome  . SHOULDER ARTHROSCOPY W/ ROTATOR CUFF REPAIR Bilateral 2004-2009   left 2004, right 2009  . VASECTOMY     Social History   Occupational History  . Not on file.   Social History Main Topics  . Smoking status: Former Smoker    Packs/day: 1.00    Years: 5.00    Types: Cigarettes    Quit date: 10/17/1968  . Smokeless tobacco: Never Used  . Alcohol use Yes     Comment: "quit drinking ~ 10/1968  . Drug use: No  . Sexual activity: Not Currently     Garald Balding, MD   Note - This record has  been created using [employer].  Chart creation errors have been sought, but may not always  have been located. Such creation errors do not reflect on  the standard of medical care.

## 2017-04-03 DIAGNOSIS — M47812 Spondylosis without myelopathy or radiculopathy, cervical region: Secondary | ICD-10-CM | POA: Diagnosis not present

## 2017-04-04 DIAGNOSIS — M75101 Unspecified rotator cuff tear or rupture of right shoulder, not specified as traumatic: Secondary | ICD-10-CM | POA: Diagnosis not present

## 2017-04-04 DIAGNOSIS — R531 Weakness: Secondary | ICD-10-CM | POA: Diagnosis not present

## 2017-04-04 DIAGNOSIS — M25511 Pain in right shoulder: Secondary | ICD-10-CM | POA: Diagnosis not present

## 2017-04-04 DIAGNOSIS — M25611 Stiffness of right shoulder, not elsewhere classified: Secondary | ICD-10-CM | POA: Diagnosis not present

## 2017-04-06 DIAGNOSIS — M75101 Unspecified rotator cuff tear or rupture of right shoulder, not specified as traumatic: Secondary | ICD-10-CM | POA: Diagnosis not present

## 2017-04-06 DIAGNOSIS — R531 Weakness: Secondary | ICD-10-CM | POA: Diagnosis not present

## 2017-04-06 DIAGNOSIS — M25611 Stiffness of right shoulder, not elsewhere classified: Secondary | ICD-10-CM | POA: Diagnosis not present

## 2017-04-06 DIAGNOSIS — M25511 Pain in right shoulder: Secondary | ICD-10-CM | POA: Diagnosis not present

## 2017-04-10 DIAGNOSIS — M25611 Stiffness of right shoulder, not elsewhere classified: Secondary | ICD-10-CM | POA: Diagnosis not present

## 2017-04-10 DIAGNOSIS — M25511 Pain in right shoulder: Secondary | ICD-10-CM | POA: Diagnosis not present

## 2017-04-10 DIAGNOSIS — M75101 Unspecified rotator cuff tear or rupture of right shoulder, not specified as traumatic: Secondary | ICD-10-CM | POA: Diagnosis not present

## 2017-04-10 DIAGNOSIS — R531 Weakness: Secondary | ICD-10-CM | POA: Diagnosis not present

## 2017-04-12 ENCOUNTER — Telehealth: Payer: Self-pay

## 2017-04-12 DIAGNOSIS — M25511 Pain in right shoulder: Secondary | ICD-10-CM | POA: Diagnosis not present

## 2017-04-12 DIAGNOSIS — M75101 Unspecified rotator cuff tear or rupture of right shoulder, not specified as traumatic: Secondary | ICD-10-CM | POA: Diagnosis not present

## 2017-04-12 DIAGNOSIS — R531 Weakness: Secondary | ICD-10-CM | POA: Diagnosis not present

## 2017-04-12 DIAGNOSIS — M25611 Stiffness of right shoulder, not elsewhere classified: Secondary | ICD-10-CM | POA: Diagnosis not present

## 2017-04-12 NOTE — Telephone Encounter (Signed)
Patient called to report a tick bite. He removed the tick yesterday but is not sure how long it had been there. He states the area is red and itchy. He has been using OTC hydrocortisone cream with little relief. Advised pt he can apply some OTC antibacterial ointment and take an allergy medication such as Claritin, Zyrtec or Benadryl. Watch area for any signs of infection. Contact office if area not improving. Pt voiced understanding to all. Nothing further needed at this time.

## 2017-04-13 ENCOUNTER — Ambulatory Visit: Payer: Medicare Other | Admitting: Family Medicine

## 2017-04-13 DIAGNOSIS — W57XXXA Bitten or stung by nonvenomous insect and other nonvenomous arthropods, initial encounter: Secondary | ICD-10-CM | POA: Diagnosis not present

## 2017-04-13 DIAGNOSIS — L03114 Cellulitis of left upper limb: Secondary | ICD-10-CM | POA: Diagnosis not present

## 2017-04-18 ENCOUNTER — Encounter: Payer: Self-pay | Admitting: Family Medicine

## 2017-04-18 ENCOUNTER — Ambulatory Visit (INDEPENDENT_AMBULATORY_CARE_PROVIDER_SITE_OTHER): Payer: Medicare Other | Admitting: Family Medicine

## 2017-04-18 VITALS — BP 110/80 | HR 74 | Temp 98.8°F | Ht 72.0 in | Wt 182.9 lb

## 2017-04-18 DIAGNOSIS — I10 Essential (primary) hypertension: Secondary | ICD-10-CM

## 2017-04-18 DIAGNOSIS — Z23 Encounter for immunization: Secondary | ICD-10-CM | POA: Diagnosis not present

## 2017-04-18 DIAGNOSIS — E119 Type 2 diabetes mellitus without complications: Secondary | ICD-10-CM | POA: Diagnosis not present

## 2017-04-18 DIAGNOSIS — R634 Abnormal weight loss: Secondary | ICD-10-CM

## 2017-04-18 DIAGNOSIS — R972 Elevated prostate specific antigen [PSA]: Secondary | ICD-10-CM

## 2017-04-18 DIAGNOSIS — E785 Hyperlipidemia, unspecified: Secondary | ICD-10-CM

## 2017-04-18 DIAGNOSIS — R252 Cramp and spasm: Secondary | ICD-10-CM | POA: Diagnosis not present

## 2017-04-18 LAB — HEPATIC FUNCTION PANEL
ALT: 18 U/L (ref 0–53)
AST: 20 U/L (ref 0–37)
Albumin: 4.1 g/dL (ref 3.5–5.2)
Alkaline Phosphatase: 79 U/L (ref 39–117)
Bilirubin, Direct: 0.1 mg/dL (ref 0.0–0.3)
Total Bilirubin: 0.6 mg/dL (ref 0.2–1.2)
Total Protein: 6.1 g/dL (ref 6.0–8.3)

## 2017-04-18 LAB — CBC WITH DIFFERENTIAL/PLATELET
Basophils Absolute: 0.1 10*3/uL (ref 0.0–0.1)
Basophils Relative: 1 % (ref 0.0–3.0)
Eosinophils Absolute: 0.1 10*3/uL (ref 0.0–0.7)
Eosinophils Relative: 1.1 % (ref 0.0–5.0)
HCT: 44.2 % (ref 39.0–52.0)
Hemoglobin: 14.8 g/dL (ref 13.0–17.0)
Lymphocytes Relative: 21.7 % (ref 12.0–46.0)
Lymphs Abs: 1.8 10*3/uL (ref 0.7–4.0)
MCHC: 33.4 g/dL (ref 30.0–36.0)
MCV: 93.5 fl (ref 78.0–100.0)
Monocytes Absolute: 0.7 10*3/uL (ref 0.1–1.0)
Monocytes Relative: 8.2 % (ref 3.0–12.0)
Neutro Abs: 5.8 10*3/uL (ref 1.4–7.7)
Neutrophils Relative %: 68 % (ref 43.0–77.0)
Platelets: 204 10*3/uL (ref 150.0–400.0)
RBC: 4.73 Mil/uL (ref 4.22–5.81)
RDW: 13.8 % (ref 11.5–15.5)
WBC: 8.5 10*3/uL (ref 4.0–10.5)

## 2017-04-18 LAB — BASIC METABOLIC PANEL
BUN: 12 mg/dL (ref 6–23)
CO2: 29 mEq/L (ref 19–32)
Calcium: 9.4 mg/dL (ref 8.4–10.5)
Chloride: 109 mEq/L (ref 96–112)
Creatinine, Ser: 0.8 mg/dL (ref 0.40–1.50)
GFR: 101.27 mL/min (ref >60.00)
Glucose, Bld: 118 mg/dL — ABNORMAL HIGH (ref 70–99)
Potassium: 4.7 mEq/L (ref 3.5–5.1)
Sodium: 145 mEq/L (ref 135–145)

## 2017-04-18 LAB — HEMOGLOBIN A1C: Hgb A1c MFr Bld: 6.2 % (ref 4.6–6.5)

## 2017-04-18 MED ORDER — METOPROLOL SUCCINATE ER 50 MG PO TB24: ORAL_TABLET | ORAL | 3 refills | Status: DC

## 2017-04-18 MED ORDER — LOSARTAN POTASSIUM 100 MG PO TABS: 100.0000 mg | ORAL_TABLET | Freq: Every day | ORAL | 3 refills | Status: DC

## 2017-04-18 MED ORDER — HYDROCHLOROTHIAZIDE 12.5 MG PO CAPS: 12.5000 mg | ORAL_CAPSULE | Freq: Every day | ORAL | 3 refills | Status: DC

## 2017-04-18 NOTE — Patient Instructions (Signed)
Leg Cramps Leg cramps occur when a muscle or muscles tighten and you have no control over this tightening (involuntary muscle contraction). Muscle cramps can develop in any muscle, but the most common place is in the calf muscles of the leg. Those cramps can occur during exercise or when you are at rest. Leg cramps are painful, and they may last for a few seconds to a few minutes. Cramps may return several times before they finally stop. Usually, leg cramps are not caused by a serious medical problem. In many cases, the cause is not known. Some common causes include:  Overexertion.  Overuse from repetitive motions, or doing the same thing over and over.  Remaining in a certain position for a long period of time.  Improper preparation, form, or technique while performing a sport or an activity.  Dehydration.  Injury.  Side effects of some medicines.  Abnormally low levels of the salts and ions in your blood (electrolytes), especially potassium and calcium. These levels could be low if you are taking water pills (diuretics) or if you are pregnant.  Follow these instructions at home: Watch your condition for any changes. Taking the following actions may help to lessen any discomfort that you are feeling:  Stay well-hydrated. Drink enough fluid to keep your urine clear or pale yellow.  Try massaging, stretching, and relaxing the affected muscle. Do this for several minutes at a time.  For tight or tense muscles, use a warm towel, heating pad, or hot shower water directed to the affected area.  If you are sore or have pain after a cramp, applying ice to the affected area may relieve discomfort. ? Put ice in a plastic bag. ? Place a towel between your skin and the bag. ? Leave the ice on for 20 minutes, 2-3 times per day.  Avoid strenuous exercise for several days if you have been having frequent leg cramps.  Make sure that your diet includes the essential minerals for your muscles to  work normally.  Take medicines only as directed by your health care provider.  Contact a health care provider if:  Your leg cramps get more severe or more frequent, or they do not improve over time.  Your foot becomes cold, numb, or blue. This information is not intended to replace advice given to you by your health care provider. Make sure you discuss any questions you have with your health care provider. Document Released: 09/08/2004 Document Revised: 01/07/2016 Document Reviewed: 07/09/2014 Elsevier Interactive Patient Education  2018 [employer].  Reduce tea consumption at night. Consider new shingles vaccine- Shingrix if you are interested.

## 2017-04-18 NOTE — Progress Notes (Signed)
Subjective:     Patient ID: Luis Guzman, male   DOB: 1946-01-08, 71 y.o.   MRN: [id number]  HPI Seen for medical follow-up. His chronic problems include history of hyperlipidemia, pancreatitis, hypertension, type 2 diabetes management without medication. He has long-standing history of diarrhea which is been followed and worked up extensively by GI. He also has had ongoing cervical neck problems. He's had surgery involving C4-C6 and has had repeated steroid injections without much improvement. Three-week schedule radiofrequency ablation has not seen improvement from that. He is getting some oxycodone per neurosurgery regarding his neck pain. He is contemplating neck fusion.  He has occasional nocturia and sometimes up to 3-5 times per night. No slow stream. Elevated PSA 4.31 back couple months ago. Previous to that of 3.9.  Frequent leg cramps at night and nocturia as above. Frequent drinks tea at night. No alcohol. Leg cramps are calf muscles.  Patient has lost some weight over the past several months. His weight is down about 12 pounds since June. Not clear how much of this he is trying to lose. He states his appetite is fair but diminished at times. Overall feels well.  Past Medical History:  Diagnosis Date  . GERD (gastroesophageal reflux disease)   . HYPERLIPIDEMIA 01/26/2009  . HYPERTENSION 01/26/2009  . 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 lower back pain, s/p cervical neck surgery"stiffness" remains  . Transfusion history    infant "anemia"   Past Surgical History:  Procedure Laterality Date  . APPENDECTOMY  ~ 1959  . BACK SURGERY    . CERVICAL LAMINECTOMY  1997   C 4 and C 5  . CHOLECYSTECTOMY N/A 04/15/2014   Procedure: LAPAROSCOPIC CHOLECYSTECTOMY WITH INTRAOPERATIVE CHOLANGIOGRAM;  Surgeon: Armandina Gemma, MD;  Location: [facility];  Service: General;  Laterality: N/A;  . ESOPHAGOGASTRODUODENOSCOPY N/A 03/13/2014   Procedure: ESOPHAGOGASTRODUODENOSCOPY (EGD);   Surgeon: Wonda Horner, MD;  Location: [facility] ENDOSCOPY;  Service: Endoscopy;  Laterality: N/A;  . 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  . SHOULDER ARTHROSCOPY W/ ROTATOR CUFF REPAIR Bilateral 2004-2009   left 2004, right 2009  . VASECTOMY      reports that he quit smoking about 48 years ago. His smoking use included Cigarettes. He has a 5.00 pack-year smoking history. He has never used smokeless tobacco. He reports that he drinks alcohol. He reports that he does not use drugs. family history includes Arthritis in his sister; Hypertension in his father and mother; Stroke (age of onset: 83) in his sister. Allergies  Allergen Reactions  . Cefdinir Other (See Comments)  . Penicillins Itching     Review of Systems  Constitutional: Negative for fatigue.  Eyes: Negative for visual disturbance.  Respiratory: Negative for cough, chest tightness and shortness of breath.   Cardiovascular: Negative for chest pain, palpitations and leg swelling.  Endocrine: Negative for polydipsia and polyuria.  Neurological: Negative for dizziness, syncope, weakness, light-headedness and headaches.       Objective:   Physical Exam  Constitutional: He is oriented to person, place, and time. He appears well-developed and well-nourished.  HENT:  Right Ear: External ear normal.  Left Ear: External ear normal.  Mouth/Throat: Oropharynx is clear and moist.  Eyes: Pupils are equal, round, and reactive to light.  Neck: Neck supple. No thyromegaly present.  Cardiovascular: Normal rate and regular rhythm.   Pulmonary/Chest: Effort normal and breath sounds normal. No respiratory distress. He has no wheezes. He has no rales.  Musculoskeletal: He  exhibits no edema.  Neurological: He is alert and oriented to person, place, and time.       Assessment:     #1 hypertension stable and at goal  #2 increased PSA velocity  #3 frequent leg cramps  #4 type 2 diabetes managed by diet and lifestyle  #5  history of hyperlipidemia with intolerance to multiple statins    Plan:     -check labs with hemoglobin A1c, CBC, basic metabolic panel -Refilled regular medications for one year -Flu vaccine given -Repeat PSA in about 3 months  Eulas Post MD  Primary Care at [facility]

## 2017-04-19 DIAGNOSIS — M25611 Stiffness of right shoulder, not elsewhere classified: Secondary | ICD-10-CM | POA: Diagnosis not present

## 2017-04-19 DIAGNOSIS — M75101 Unspecified rotator cuff tear or rupture of right shoulder, not specified as traumatic: Secondary | ICD-10-CM | POA: Diagnosis not present

## 2017-04-19 DIAGNOSIS — M25511 Pain in right shoulder: Secondary | ICD-10-CM | POA: Diagnosis not present

## 2017-04-19 DIAGNOSIS — R531 Weakness: Secondary | ICD-10-CM | POA: Diagnosis not present

## 2017-04-25 DIAGNOSIS — R531 Weakness: Secondary | ICD-10-CM | POA: Diagnosis not present

## 2017-04-25 DIAGNOSIS — M75101 Unspecified rotator cuff tear or rupture of right shoulder, not specified as traumatic: Secondary | ICD-10-CM | POA: Diagnosis not present

## 2017-04-25 DIAGNOSIS — M25611 Stiffness of right shoulder, not elsewhere classified: Secondary | ICD-10-CM | POA: Diagnosis not present

## 2017-04-25 DIAGNOSIS — M25511 Pain in right shoulder: Secondary | ICD-10-CM | POA: Diagnosis not present

## 2017-04-28 DIAGNOSIS — R531 Weakness: Secondary | ICD-10-CM | POA: Diagnosis not present

## 2017-04-28 DIAGNOSIS — M75101 Unspecified rotator cuff tear or rupture of right shoulder, not specified as traumatic: Secondary | ICD-10-CM | POA: Diagnosis not present

## 2017-04-28 DIAGNOSIS — M25511 Pain in right shoulder: Secondary | ICD-10-CM | POA: Diagnosis not present

## 2017-04-28 DIAGNOSIS — M25611 Stiffness of right shoulder, not elsewhere classified: Secondary | ICD-10-CM | POA: Diagnosis not present

## 2017-05-01 ENCOUNTER — Encounter (INDEPENDENT_AMBULATORY_CARE_PROVIDER_SITE_OTHER): Payer: Self-pay | Admitting: Orthopaedic Surgery

## 2017-05-01 ENCOUNTER — Ambulatory Visit (INDEPENDENT_AMBULATORY_CARE_PROVIDER_SITE_OTHER): Payer: Medicare Other | Admitting: Orthopaedic Surgery

## 2017-05-01 VITALS — BP 144/82 | HR 88 | Resp 14 | Ht 72.0 in | Wt 200.0 lb

## 2017-05-01 DIAGNOSIS — R531 Weakness: Secondary | ICD-10-CM | POA: Diagnosis not present

## 2017-05-01 DIAGNOSIS — M75101 Unspecified rotator cuff tear or rupture of right shoulder, not specified as traumatic: Secondary | ICD-10-CM | POA: Diagnosis not present

## 2017-05-01 DIAGNOSIS — M25511 Pain in right shoulder: Secondary | ICD-10-CM | POA: Diagnosis not present

## 2017-05-01 DIAGNOSIS — G8929 Other chronic pain: Secondary | ICD-10-CM

## 2017-05-01 DIAGNOSIS — M25611 Stiffness of right shoulder, not elsewhere classified: Secondary | ICD-10-CM | POA: Diagnosis not present

## 2017-05-01 NOTE — Progress Notes (Signed)
Office Visit Note   Patient: Luis Guzman           Date of Birth: 14-Jun-1946           MRN: [id number] Visit Date: 05/01/2017              Requested by: Eulas Post, MD [place], [place] [address] PCP: Eulas Post, MD   Assessment & Plan: Visit Diagnoses:  1. Chronic right shoulder pain   2-1/2 months status post right shoulder arthroscopic subacromial decompression, distal clavicle resection and mini open rotator cuff tear repair. Plan: Continue with her physical therapy. 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 Discussed activity limitation. Office 1 month  Follow-Up Instructions: Return in about 1 month (around 05/31/2017).   Orders:  No orders of the defined types were placed in this encounter.  No orders of the defined types were placed in this encounter.     Procedures: No procedures performed   Clinical Data: No additional findings.   Subjective: Chief Complaint  Patient presents with  . Right Shoulder - Routine Post Op    Luis Guzman is a 71 y o status post 2.5 mo. Right shoulder RTC repair. He relates he takes Oxycodon (Dr. Maryjean Ka) for neck pain C-4,5,6   Continues to receive physical therapy. Not sure that he has the preoperative symptoms but still having some limitation of overhead activity. Continues to have a problem with the cervical spine and this had recent radiofrequency ablation with little if any results HPI  Review of Systems  Constitutional: Negative for fatigue.  HENT: Negative for hearing loss.   Respiratory: Negative for apnea, chest tightness and shortness of breath.   Cardiovascular: Negative for chest pain, palpitations and leg swelling.  Gastrointestinal: Negative for blood in stool, constipation and diarrhea.  Genitourinary: Negative for difficulty urinating.  Musculoskeletal: Positive for neck pain and neck stiffness. Negative for arthralgias, back pain, joint swelling and myalgias.    Neurological: Positive for headaches. Negative for weakness and numbness.  Hematological: Does not bruise/bleed easily.  Psychiatric/Behavioral: Negative for sleep disturbance. The patient is not nervous/anxious.      Objective: Vital Signs: BP (!) 144/82   Pulse 88   Resp 14   Ht 6' (1.829 m)   Wt 200 lb (90.7 kg)   BMI 27.12 kg/m   Physical Exam  Ortho Exam negative impingement right shoulder. Arthroscopic portals and incision healing without problem. Neurovascular exam intact. Abduction over 90. Only has about 120 of shoulder flexion at which point he is tight and uncomfortable. Biceps intact. Good grip and release. Able to touch the lumbosacral junction. No swelling. No referred pain to right shoulder from cervical spine Specialty Comments:  No specialty comments available.  Imaging: No results found.   PMFS History: Patient Active Problem List   Diagnosis Date Noted  . AAA (abdominal aortic aneurysm) without rupture ([place]) 04/15/2016  . Biliary dyskinesia 04/08/2014  . Abdominal pain, epigastric 04/08/2014  . Pancreatitis, acute 03/12/2014  . Pancreatitis 03/12/2014  . Type 2 diabetes mellitus ([place]) 06/25/2012  . ACUTE BRONCHITIS 03/11/2010  . 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 SINUSITIS, ACUTE 06/11/2009  . Hyperlipidemia 01/26/2009  . Essential hypertension 01/26/2009   Past Medical History:  Diagnosis Date  . GERD (gastroesophageal reflux disease)   . HYPERLIPIDEMIA 01/26/2009  . HYPERTENSION 01/26/2009  . 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 lower back pain, s/p cervical neck surgery"stiffness" remains  . Transfusion history  infant "anemia"    Family History  Problem Relation Age of Onset  . Hypertension Mother   . Hypertension Father   . Arthritis Sister        rheumatoid  . Stroke Sister 3    Past Surgical History:  Procedure Laterality Date  . APPENDECTOMY  ~ 1959  . BACK SURGERY    .  CERVICAL LAMINECTOMY  1997   C 4 and C 5  . CHOLECYSTECTOMY N/A 04/15/2014   Procedure: LAPAROSCOPIC CHOLECYSTECTOMY WITH INTRAOPERATIVE CHOLANGIOGRAM;  Surgeon: Armandina Gemma, MD;  Location: [facility];  Service: General;  Laterality: N/A;  . ESOPHAGOGASTRODUODENOSCOPY N/A 03/13/2014   Procedure: ESOPHAGOGASTRODUODENOSCOPY (EGD);  Surgeon: Wonda Horner, MD;  Location: [facility] ENDOSCOPY;  Service: Endoscopy;  Laterality: N/A;  . 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  . SHOULDER ARTHROSCOPY W/ ROTATOR CUFF REPAIR Bilateral 2004-2009   left 2004, right 2009  . VASECTOMY     Social History   Occupational History  . Not on file.   Social History Main Topics  . Smoking status: Former Smoker    Packs/day: 1.00    Years: 5.00    Types: Cigarettes    Quit date: 10/17/1968  . Smokeless tobacco: Never Used  . Alcohol use Yes     Comment: "quit drinking ~ 10/1968  . Drug use: No  . Sexual activity: Not Currently

## 2017-05-04 ENCOUNTER — Encounter: Payer: Self-pay | Admitting: Family Medicine

## 2017-05-05 DIAGNOSIS — M25611 Stiffness of right shoulder, not elsewhere classified: Secondary | ICD-10-CM | POA: Diagnosis not present

## 2017-05-05 DIAGNOSIS — M47812 Spondylosis without myelopathy or radiculopathy, cervical region: Secondary | ICD-10-CM | POA: Diagnosis not present

## 2017-05-05 DIAGNOSIS — M25511 Pain in right shoulder: Secondary | ICD-10-CM | POA: Diagnosis not present

## 2017-05-05 DIAGNOSIS — R531 Weakness: Secondary | ICD-10-CM | POA: Diagnosis not present

## 2017-05-05 DIAGNOSIS — M542 Cervicalgia: Secondary | ICD-10-CM | POA: Diagnosis not present

## 2017-05-05 DIAGNOSIS — Z6824 Body mass index (BMI) 24.0-24.9, adult: Secondary | ICD-10-CM | POA: Diagnosis not present

## 2017-05-05 DIAGNOSIS — M75101 Unspecified rotator cuff tear or rupture of right shoulder, not specified as traumatic: Secondary | ICD-10-CM | POA: Diagnosis not present

## 2017-05-09 DIAGNOSIS — M25511 Pain in right shoulder: Secondary | ICD-10-CM | POA: Diagnosis not present

## 2017-05-09 DIAGNOSIS — M25611 Stiffness of right shoulder, not elsewhere classified: Secondary | ICD-10-CM | POA: Diagnosis not present

## 2017-05-09 DIAGNOSIS — M75101 Unspecified rotator cuff tear or rupture of right shoulder, not specified as traumatic: Secondary | ICD-10-CM | POA: Diagnosis not present

## 2017-05-09 DIAGNOSIS — R531 Weakness: Secondary | ICD-10-CM | POA: Diagnosis not present

## 2017-05-29 ENCOUNTER — Ambulatory Visit (INDEPENDENT_AMBULATORY_CARE_PROVIDER_SITE_OTHER): Payer: Medicare Other | Admitting: Orthopaedic Surgery

## 2017-05-29 ENCOUNTER — Encounter (INDEPENDENT_AMBULATORY_CARE_PROVIDER_SITE_OTHER): Payer: Self-pay | Admitting: Orthopaedic Surgery

## 2017-05-29 VITALS — BP 118/72 | HR 70 | Resp 14 | Ht 73.0 in | Wt 200.0 lb

## 2017-05-29 DIAGNOSIS — G8929 Other chronic pain: Secondary | ICD-10-CM | POA: Diagnosis not present

## 2017-05-29 DIAGNOSIS — R531 Weakness: Secondary | ICD-10-CM | POA: Diagnosis not present

## 2017-05-29 DIAGNOSIS — M25511 Pain in right shoulder: Secondary | ICD-10-CM

## 2017-05-29 DIAGNOSIS — M25611 Stiffness of right shoulder, not elsewhere classified: Secondary | ICD-10-CM | POA: Diagnosis not present

## 2017-05-29 DIAGNOSIS — M75101 Unspecified rotator cuff tear or rupture of right shoulder, not specified as traumatic: Secondary | ICD-10-CM | POA: Diagnosis not present

## 2017-05-29 MED ORDER — BUPIVACAINE HCL 0.5 % IJ SOLN
2.0000 mL | INTRAMUSCULAR | Status: AC | PRN
  Administered 2017-05-29: 2 mL via INTRA_ARTICULAR

## 2017-05-29 MED ORDER — LIDOCAINE HCL 1 % IJ SOLN
2.0000 mL | INTRAMUSCULAR | Status: AC | PRN
  Administered 2017-05-29: 2 mL

## 2017-05-29 MED ORDER — METHYLPREDNISOLONE ACETATE 40 MG/ML IJ SUSP
80.0000 mg | INTRAMUSCULAR | Status: AC | PRN
  Administered 2017-05-29: 80 mg

## 2017-05-29 NOTE — Progress Notes (Signed)
Office Visit Note   Patient: Luis Guzman           Date of Birth: 01-03-1946           MRN: [id number] Visit Date: 05/29/2017              Requested by: Eulas Post, MD [place], [place] [address] PCP: Eulas Post, MD   Assessment & Plan: Visit Diagnoses:  1. Chronic right shoulder pain     Plan: 3.5 months S/P Rotator cuff tear repair right shoulder with an SCD and DCR. In many respects he is better than he was preoperatively but still having some difficulty in certain motions. Continues to go to physical therapy. I will inject the subacromial region with Xylocaine, Marcaine and Depo-Medrol and monitor his response. See him back in 6 weeks Follow-Up Instructions: Return in about 6 weeks (around 07/10/2017).   Orders:  No orders of the defined types were placed in this encounter.  No orders of the defined types were placed in this encounter.     Procedures: Large Joint Inj Date/Time: 05/29/2017 8:36 AM Performed by: Garald Balding Authorized by: Garald Balding   Consent Given by:  Patient Timeout: prior to procedure the correct patient, procedure, and site was verified   Indications:  Pain Location:  Shoulder Site:  L subacromial bursa Prep: patient was prepped and draped in usual sterile fashion   Needle Size:  25 G Needle Length:  1.5 inches Approach:  Lateral Ultrasound Guidance: No   Fluoroscopic Guidance: No   Arthrogram: No   Medications:  80 mg methylPREDNISolone acetate 40 MG/ML; 2 mL lidocaine 1 %; 2 mL bupivacaine 0.5 % Aspiration Attempted: No   Patient tolerance:  Patient tolerated the procedure well with no immediate complications     Clinical Data: No additional findings.   Subjective: Chief Complaint  Patient presents with  . Left Shoulder - Routine Post Op    Luis Guzman is a 70 y o S/P 3 months Left shoulder arthroscopy RCR. He relates his arm still hurts   Dj relates that he is better with his right  shoulder and he was preoperatively. There are many things he's able to do that he couldn't do preoperatively. However, he still has some pain with certain motions particularly with abduction. He continues to go to physical therapy. 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 Ellene Route. He's already had radiofrequency ablation without much relief. He takes 10 mg of oxycodone 3 times a day for his neck.  HPI  Review of Systems  Constitutional: Negative for fatigue.  HENT: Negative for hearing loss.   Respiratory: Negative for apnea, chest tightness and shortness of breath.   Cardiovascular: Negative for chest pain, palpitations and leg swelling.  Gastrointestinal: Negative for blood in stool, constipation and diarrhea.  Genitourinary: Negative for difficulty urinating.  Musculoskeletal: Positive for neck pain and neck stiffness. Negative for arthralgias, back pain, joint swelling and myalgias.  Neurological: Negative for weakness, numbness and headaches.  Hematological: Does not bruise/bleed easily.  Psychiatric/Behavioral: Negative for sleep disturbance. The patient is not nervous/anxious.      Objective: Vital Signs: Resp 14   Ht 6\' 1"  (1.854 m)   Wt 200 lb (90.7 kg)   BMI 26.39 kg/m   Physical Exam  Ortho Exam awake alert and oriented 3. Comfortable sitting. Negative impingement but does have positive pain in the subacromial region anteriorly  and laterally with abduction. Negative speed sign. No pain at the acromioclavicular joint. Good strength with his arm at his side. Skin intact. Wounds appear without evidence of infection  Specialty Comments:  No specialty comments available.  Imaging: No results found.   PMFS History: Patient Active Problem List   Diagnosis Date Noted  . AAA (abdominal aortic aneurysm) without rupture ([place]) 04/15/2016  . Biliary dyskinesia 04/08/2014  . Abdominal pain, epigastric 04/08/2014  . Pancreatitis, acute  03/12/2014  . Pancreatitis 03/12/2014  . Type 2 diabetes mellitus ([place]) 06/25/2012  . ACUTE BRONCHITIS 03/11/2010  . 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 SINUSITIS, ACUTE 06/11/2009  . Hyperlipidemia 01/26/2009  . Essential hypertension 01/26/2009   Past Medical History:  Diagnosis Date  . GERD (gastroesophageal reflux disease)   . HYPERLIPIDEMIA 01/26/2009  . HYPERTENSION 01/26/2009  . 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 lower back pain, s/p cervical neck surgery"stiffness" remains  . Transfusion history    infant "anemia"    Family History  Problem Relation Age of Onset  . Hypertension Mother   . Hypertension Father   . Arthritis Sister        rheumatoid  . Stroke Sister 36    Past Surgical History:  Procedure Laterality Date  . APPENDECTOMY  ~ 1959  . BACK SURGERY    . CERVICAL LAMINECTOMY  1997   C 4 and C 5  . CHOLECYSTECTOMY N/A 04/15/2014   Procedure: LAPAROSCOPIC CHOLECYSTECTOMY WITH INTRAOPERATIVE CHOLANGIOGRAM;  Surgeon: Armandina Gemma, MD;  Location: [facility] ORS;  Service: General;  Laterality: N/A;  . ESOPHAGOGASTRODUODENOSCOPY N/A 03/13/2014   Procedure: ESOPHAGOGASTRODUODENOSCOPY (EGD);  Surgeon: Wonda Horner, MD;  Location: [facility] ENDOSCOPY;  Service: Endoscopy;  Laterality: N/A;  . 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  . SHOULDER ARTHROSCOPY W/ ROTATOR CUFF REPAIR Bilateral 2004-2009   left 2004, right 2009  . VASECTOMY     Social History   Occupational History  . Not on file.   Social History Main Topics  . Smoking status: Former Smoker    Packs/day: 1.00    Years: 5.00    Types: Cigarettes    Quit date: 10/17/1968  . Smokeless tobacco: Never Used  . Alcohol use Yes     Comment: "quit drinking ~ 10/1968  . Drug use: No  . Sexual activity: Not Currently

## 2017-06-19 ENCOUNTER — Ambulatory Visit (INDEPENDENT_AMBULATORY_CARE_PROVIDER_SITE_OTHER): Payer: Medicare Other | Admitting: Orthopaedic Surgery

## 2017-06-19 ENCOUNTER — Encounter (INDEPENDENT_AMBULATORY_CARE_PROVIDER_SITE_OTHER): Payer: Self-pay | Admitting: Orthopaedic Surgery

## 2017-06-19 VITALS — BP 166/80 | HR 70 | Resp 14 | Ht 74.0 in | Wt 200.0 lb

## 2017-06-19 DIAGNOSIS — M25511 Pain in right shoulder: Secondary | ICD-10-CM

## 2017-06-19 DIAGNOSIS — G8929 Other chronic pain: Secondary | ICD-10-CM

## 2017-06-19 NOTE — Progress Notes (Signed)
Office Visit Note   Patient: Luis Guzman           Date of Birth: 1946/05/26           MRN: [id number] Visit Date: 06/19/2017              Requested by: Luis Post, MD [place], [place] [address] PCP: Luis Post, MD   Assessment & Plan: Visit Diagnoses:  1. Chronic right shoulder pain     Plan: persistent pain right shoulder after rotator cuff tear repair. I injected his shoulder in the subacromial space about 3 weeks ago without much relief. Within the past 7-10 days he's developed some ecchymosis with increased pain.We will order MRI scan Follow-Up Instructions: Return after MRI right shoulder.   Orders:  No orders of the defined types were placed in this encounter.  No orders of the defined types were placed in this encounter.     Procedures: No procedures performed   Clinical Data: No additional findings.   Subjective: Chief Complaint  Patient presents with  . Right Shoulder - Pain, Routine Guzman Op    Luis Guzman is a 71 y o that is 4 months S/P Right shoulder arthroscopy. Pt is not in PT due to his travelling schedule.  recurrent pain to the point of compromise right shoulder. Now has developed ecchymosis without obvious injury.eurovascular exam intact. Still takes oxycodone for chronic neck pain  HPI  Review of Systems  Constitutional: Negative for fatigue.  HENT: Negative for hearing loss.   Respiratory: Negative for apnea, chest tightness and shortness of breath.   Cardiovascular: Negative for chest pain, palpitations and leg swelling.  Gastrointestinal: Negative for blood in stool, constipation and diarrhea.  Genitourinary: Negative for difficulty urinating.  Musculoskeletal: Positive for neck pain and neck stiffness. Negative for arthralgias, back pain, joint swelling and myalgias.  Neurological: Negative for weakness, numbness and headaches.  Hematological: Does not bruise/bleed easily.  Psychiatric/Behavioral: Positive  for sleep disturbance. The patient is not nervous/anxious.      Objective: Vital Signs: BP (!) 166/80   Pulse 70   Resp 14   Ht 6\' 2"  (1.88 m)   Wt 200 lb (90.7 kg)   BMI 25.68 kg/m   Physical Exam  Ortho Examdifficulty raising arm in abduction and flexion related to pain. Ecchymosis along the proximal biceps and just lateral to the biceps. Biceps tendon appears to be intact. Some discomfort over the biceps muscleand tendon.iceps tendon was intact at the time of the surgery  Specialty Comments:  No specialty comments available.  Imaging: No results found.   PMFS History: Patient Active Problem List   Diagnosis Date Noted  . AAA (abdominal aortic aneurysm) without rupture ([place]) 04/15/2016  . Biliary dyskinesia 04/08/2014  . Abdominal pain, epigastric 04/08/2014  . Pancreatitis, acute 03/12/2014  . Pancreatitis 03/12/2014  . Type 2 diabetes mellitus ([employer]) 06/25/2012  . ACUTE BRONCHITIS 03/11/2010  . 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 SINUSITIS, ACUTE 06/11/2009  . Hyperlipidemia 01/26/2009  . Essential hypertension 01/26/2009   Past Medical History:  Diagnosis Date  . GERD (gastroesophageal reflux disease)   . HYPERLIPIDEMIA 01/26/2009  . HYPERTENSION 01/26/2009  . 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 lower back pain, s/p cervical neck surgery"stiffness" remains  . Transfusion history    infant "anemia"    Family History  Problem Relation Age of Onset  . Hypertension Mother   . Hypertension Father   . Arthritis  Sister        rheumatoid  . Stroke Sister 25    Past Surgical History:  Procedure Laterality Date  . APPENDECTOMY  ~ 1959  . BACK SURGERY    . CERVICAL LAMINECTOMY  1997   C 4 and C 5  . 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  . SHOULDER ARTHROSCOPY W/ ROTATOR CUFF REPAIR Bilateral 2004-2009   left 2004, right 2009  . VASECTOMY     Social History   Occupational History  . Not  on file  Tobacco Use  . Smoking status: Former Smoker    Packs/day: 1.00    Years: 5.00    Pack years: 5.00    Types: Cigarettes    Last attempt to quit: 10/17/1968    Years since quitting: 48.7  . Smokeless tobacco: Never Used  Substance and Sexual Activity  . Alcohol use: Yes    Comment: "quit drinking ~ 10/1968  . Drug use: No  . Sexual activity: Not Currently

## 2017-06-20 ENCOUNTER — Other Ambulatory Visit (INDEPENDENT_AMBULATORY_CARE_PROVIDER_SITE_OTHER): Payer: Self-pay

## 2017-06-20 DIAGNOSIS — G8929 Other chronic pain: Secondary | ICD-10-CM

## 2017-06-20 DIAGNOSIS — M25511 Pain in right shoulder: Principal | ICD-10-CM

## 2017-06-24 ENCOUNTER — Ambulatory Visit
Admission: RE | Admit: 2017-06-24 | Discharge: 2017-06-24 | Disposition: A | Discharge status: Home / Self Care | Payer: Medicare Other | Source: Ambulatory Visit | Attending: Orthopaedic Surgery | Admitting: Orthopaedic Surgery

## 2017-06-24 DIAGNOSIS — G8929 Other chronic pain: Secondary | ICD-10-CM

## 2017-06-24 DIAGNOSIS — M25511 Pain in right shoulder: Secondary | ICD-10-CM | POA: Diagnosis not present

## 2017-07-03 ENCOUNTER — Ambulatory Visit (INDEPENDENT_AMBULATORY_CARE_PROVIDER_SITE_OTHER): Payer: Medicare Other | Admitting: Orthopaedic Surgery

## 2017-07-03 ENCOUNTER — Encounter (INDEPENDENT_AMBULATORY_CARE_PROVIDER_SITE_OTHER): Payer: Self-pay | Admitting: Orthopaedic Surgery

## 2017-07-03 VITALS — BP 146/78 | HR 69 | Resp 14 | Ht 75.0 in | Wt 195.0 lb

## 2017-07-03 DIAGNOSIS — M25511 Pain in right shoulder: Secondary | ICD-10-CM | POA: Diagnosis not present

## 2017-07-03 DIAGNOSIS — G8929 Other chronic pain: Secondary | ICD-10-CM

## 2017-07-03 NOTE — Progress Notes (Signed)
Office Visit Note   Patient: Luis Guzman           Date of Birth: 1946/07/30           MRN: [id number] Visit Date: 07/03/2017              Requested by: Eulas Post, MD [place], [place] [address] PCP: Eulas Post, MD   Assessment & Plan: Visit Diagnoses:  1. Chronic right shoulder pain   4 months status post rotator cuff tear repair of right shoulder. Having progressive pain with overhead activity to the point of compromise. Therefore, I ordered a repeat MRI scan. 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 The infraspinatus appears to be intact. Subscapularis is also intact. There is some fatty change within the supraspinatus and infraspinatus and teres minor muscles. Long head of the biceps appears to be intact. There were no findings for bony impingement. Mild glenohumeral joint degenerative changes with joint space narrowing and early spurring. Long discussion with Rosendo and his wife, Ivin Booty regarding the above. I would suggest repeat surgery to repair the supraspinatus and apply a dermis span patch. They agree we'll proceed some time after December 11  Follow-Up Instructions: Return will schedule surgery right shoulder.   Orders:  No orders of the defined types were placed in this encounter.  No orders of the defined types were placed in this encounter.     Procedures: No procedures performed   Clinical Data: No additional findings.   Subjective: Chief Complaint  Patient presents with  . Right Shoulder - Pain    Mr. Welliver is a 56 y o that is here for MRI right shoulder.  See above discussion. Mr. Trompeter is having progressive pain with overhead activity with pain in the anterior lateral subacromial region. He has not responded to physical therapy postoperatively and subacromial cortisone injections. He is having compromise of his activities. There does not appear to be any association between his shoulder pain in his chronic  neck discomfort. Repeat MRI scan as above  HPI  Review of Systems  Constitutional: Negative for fatigue.  HENT: Negative for hearing loss.   Respiratory: Negative for apnea, chest tightness and shortness of breath.   Cardiovascular: Negative for chest pain, palpitations and leg swelling.  Gastrointestinal: Negative for blood in stool, constipation and diarrhea.  Genitourinary: Negative for difficulty urinating.  Musculoskeletal: Negative for arthralgias, back pain, joint swelling, myalgias, neck pain and neck stiffness.  Neurological: Negative for weakness, numbness and headaches.  Hematological: Does not bruise/bleed easily.  Psychiatric/Behavioral: Negative for sleep disturbance. The patient is not nervous/anxious.      Objective: Vital Signs: BP (!) 146/78   Pulse 69   Resp 14   Ht 6\' 3"  (1.905 m)   Wt 195 lb (88.5 kg)   BMI 24.37 kg/m   Physical Exam  Ortho Exam awake alert and oriented 3. Comfortable sitting. Painful overhead arc of motion. No evidence of adhesive capsulitis. Positive impingement on the extreme of external rotation. Biceps appears to be intact. No pain over the acromioclavicular joint .some weakness particularly with external rotation  No specialty comments available.  Imaging: No results found.   PMFS History: Patient Active Problem List   Diagnosis Date Noted  . AAA (abdominal aortic aneurysm) without rupture ([place]) 04/15/2016  . Biliary dyskinesia 04/08/2014  . Abdominal pain, epigastric 04/08/2014  . Pancreatitis, acute 03/12/2014  . Pancreatitis 03/12/2014  . Type 2 diabetes mellitus ([place]) 06/25/2012  .  ACUTE BRONCHITIS 03/11/2010  . 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 SINUSITIS, ACUTE 06/11/2009  . Hyperlipidemia 01/26/2009  . Essential hypertension 01/26/2009   Past Medical History:  Diagnosis Date  . GERD (gastroesophageal reflux disease)   . HYPERLIPIDEMIA 01/26/2009  . HYPERTENSION 01/26/2009  .  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 lower back pain, s/p cervical neck surgery"stiffness" remains  . Transfusion history    infant "anemia"    Family History  Problem Relation Age of Onset  . Hypertension Mother   . Hypertension Father   . Arthritis Sister        rheumatoid  . Stroke Sister 84    Past Surgical History:  Procedure Laterality Date  . APPENDECTOMY  ~ 1959  . BACK SURGERY    . CERVICAL LAMINECTOMY  1997   C 4 and C 5  . ESOPHAGOGASTRODUODENOSCOPY (EGD) N/A 03/13/2014   Performed by Wonda Horner, MD at [facility]  . LAPAROSCOPIC CHOLECYSTECTOMY WITH INTRAOPERATIVE CHOLANGIOGRAM N/A 04/15/2014   Performed by Armandina Gemma, MD at [facility] ORS  . 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  . SHOULDER ARTHROSCOPY W/ ROTATOR CUFF REPAIR Bilateral 2004-2009   left 2004, right 2009  . VASECTOMY     Social History   Occupational History  . Not on file  Tobacco Use  . Smoking status: Former Smoker    Packs/day: 1.00    Years: 5.00    Pack years: 5.00    Types: Cigarettes    Last attempt to quit: 10/17/1968    Years since quitting: 48.7  . Smokeless tobacco: Never Used  Substance and Sexual Activity  . Alcohol use: Yes    Comment: "quit drinking ~ 10/1968  . Drug use: No  . Sexual activity: Not Currently

## 2017-07-10 ENCOUNTER — Ambulatory Visit (INDEPENDENT_AMBULATORY_CARE_PROVIDER_SITE_OTHER): Payer: Medicare Other | Admitting: Orthopaedic Surgery

## 2017-07-13 DIAGNOSIS — M24111 Other articular cartilage disorders, right shoulder: Secondary | ICD-10-CM | POA: Diagnosis not present

## 2017-07-13 DIAGNOSIS — G8918 Other acute postprocedural pain: Secondary | ICD-10-CM | POA: Diagnosis not present

## 2017-07-13 DIAGNOSIS — M65811 Other synovitis and tenosynovitis, right shoulder: Secondary | ICD-10-CM | POA: Diagnosis not present

## 2017-07-13 DIAGNOSIS — M7501 Adhesive capsulitis of right shoulder: Secondary | ICD-10-CM | POA: Diagnosis not present

## 2017-07-13 DIAGNOSIS — M75111 Incomplete rotator cuff tear or rupture of right shoulder, not specified as traumatic: Secondary | ICD-10-CM | POA: Diagnosis not present

## 2017-07-13 DIAGNOSIS — M75121 Complete rotator cuff tear or rupture of right shoulder, not specified as traumatic: Secondary | ICD-10-CM | POA: Diagnosis not present

## 2017-07-14 ENCOUNTER — Telehealth: Payer: Self-pay

## 2017-07-14 NOTE — Telephone Encounter (Signed)
Please advise 

## 2017-07-14 NOTE — Telephone Encounter (Signed)
Patient calling stating that he did not sleep at all last night.  Stated that he is still in a lot of pain and medication is not helping. Has tried to using pillows and ice.  Patient had surgery on yesterday. Cb# is [phone].  Please advise.

## 2017-07-14 NOTE — Telephone Encounter (Signed)
called

## 2017-07-17 ENCOUNTER — Ambulatory Visit (INDEPENDENT_AMBULATORY_CARE_PROVIDER_SITE_OTHER): Payer: Medicare Other | Admitting: Orthopaedic Surgery

## 2017-07-17 ENCOUNTER — Encounter (INDEPENDENT_AMBULATORY_CARE_PROVIDER_SITE_OTHER): Payer: Self-pay | Admitting: Orthopaedic Surgery

## 2017-07-17 VITALS — BP 129/75 | HR 71 | Resp 14 | Ht 74.0 in | Wt 200.0 lb

## 2017-07-17 DIAGNOSIS — M25511 Pain in right shoulder: Secondary | ICD-10-CM

## 2017-07-17 DIAGNOSIS — G8929 Other chronic pain: Secondary | ICD-10-CM

## 2017-07-17 NOTE — Progress Notes (Signed)
Office Visit Note   Patient: Luis Guzman           Date of Birth: 1945-09-28           MRN: [id number] Visit Date: 07/17/2017              Requested by: Eulas Post, MD [place], [place] [address] PCP: Eulas Post, MD   Assessment & Plan: Visit Diagnoses:  1. Chronic right shoulder pain   4 days status post repeat rotator cuff tear repair right shoulder with supplemental dermaspan patch. Doing well at this point. We will begin circumduction exercises and return to the office in 2 weeks. Continue with the sling  Follow-Up Instructions: Return in about 2 weeks (around 07/31/2017).   Orders:  No orders of the defined types were placed in this encounter.  No orders of the defined types were placed in this encounter.     Procedures: No procedures performed   Clinical Data: No additional findings.   Subjective: Chief Complaint  Patient presents with  . Right Shoulder - Routine Post Op    Mr. Chaloux is a 71 y o S/P 4 days Right shoulder arthroscopy with RTC repair.  Pt not in pain and is sleeping. No fevers, chills. Takes oxycodone from another physician for neck.  Repeat rotator cuff tear repair 4 days ago. Cuff was reapproximated end to end with Ethibond suture and supplemented with Vicryl. I then used a dermaspan patch. Technically procedure went well. Comfortable at present  HPI  Review of Systems  Constitutional: Negative for fatigue.  HENT: Negative for hearing loss.   Respiratory: Negative for apnea, chest tightness and shortness of breath.   Cardiovascular: Negative for chest pain, palpitations and leg swelling.  Gastrointestinal: Negative for blood in stool, constipation and diarrhea.  Genitourinary: Negative for difficulty urinating.  Musculoskeletal: Positive for neck pain and neck stiffness. Negative for arthralgias, back pain, joint swelling and myalgias.  Neurological: Negative for weakness, numbness and headaches.    Hematological: Does not bruise/bleed easily.  Psychiatric/Behavioral: Negative for sleep disturbance. The patient is not nervous/anxious.      Objective: Vital Signs: BP 129/75   Pulse 71   Resp 14   Ht 6\' 2"  (1.88 m)   Wt 200 lb (90.7 kg)   BMI 25.68 kg/m   Physical Exam  Ortho Exam right shoulder wounds clean and dry without evidence of infection. Neurovascular exam intact. Minimal ecchymosis. Specialty Comments:  No specialty comments available.  Imaging: No results found.   PMFS History: Patient Active Problem List   Diagnosis Date Noted  . AAA (abdominal aortic aneurysm) without rupture ([place]) 04/15/2016  . Biliary dyskinesia 04/08/2014  . Abdominal pain, epigastric 04/08/2014  . Pancreatitis, acute 03/12/2014  . Pancreatitis 03/12/2014  . Type 2 diabetes mellitus ([place]) 06/25/2012  . ACUTE BRONCHITIS 03/11/2010  . 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 SINUSITIS, ACUTE 06/11/2009  . Hyperlipidemia 01/26/2009  . Essential hypertension 01/26/2009   Past Medical History:  Diagnosis Date  . GERD (gastroesophageal reflux disease)   . HYPERLIPIDEMIA 01/26/2009  . HYPERTENSION 01/26/2009  . 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 lower back pain, s/p cervical neck surgery"stiffness" remains  . Transfusion history    infant "anemia"    Family History  Problem Relation Age of Onset  . Hypertension Mother   . Hypertension Father   . Arthritis Sister        rheumatoid  . Stroke Sister 52  Past Surgical History:  Procedure Laterality Date  . APPENDECTOMY  ~ 1959  . BACK SURGERY    . CERVICAL LAMINECTOMY  1997   C 4 and C 5  . CHOLECYSTECTOMY N/A 04/15/2014   Procedure: LAPAROSCOPIC CHOLECYSTECTOMY WITH INTRAOPERATIVE CHOLANGIOGRAM;  Surgeon: Armandina Gemma, MD;  Location: [facility] ORS;  Service: General;  Laterality: N/A;  . ESOPHAGOGASTRODUODENOSCOPY N/A 03/13/2014   Procedure: ESOPHAGOGASTRODUODENOSCOPY (EGD);  Surgeon: Wonda Horner, MD;  Location: [facility] ENDOSCOPY;  Service: Endoscopy;  Laterality: N/A;  . 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  . SHOULDER ARTHROSCOPY W/ ROTATOR CUFF REPAIR Bilateral 2004-2009   left 2004, right 2009  . VASECTOMY     Social History   Occupational History  . Not on file  Tobacco Use  . Smoking status: Former Smoker    Packs/day: 1.00    Years: 5.00    Pack years: 5.00    Types: Cigarettes    Last attempt to quit: 10/17/1968    Years since quitting: 48.7  . Smokeless tobacco: Never Used  Substance and Sexual Activity  . Alcohol use: Yes    Comment: "quit drinking ~ 10/1968  . Drug use: No  . Sexual activity: Not Currently

## 2017-07-31 ENCOUNTER — Encounter (INDEPENDENT_AMBULATORY_CARE_PROVIDER_SITE_OTHER): Payer: Self-pay | Admitting: Orthopaedic Surgery

## 2017-07-31 ENCOUNTER — Ambulatory Visit (INDEPENDENT_AMBULATORY_CARE_PROVIDER_SITE_OTHER): Payer: Medicare Other | Admitting: Orthopaedic Surgery

## 2017-07-31 VITALS — BP 165/92 | HR 78 | Resp 14 | Ht 74.0 in | Wt 210.0 lb

## 2017-07-31 DIAGNOSIS — I10 Essential (primary) hypertension: Secondary | ICD-10-CM | POA: Diagnosis not present

## 2017-07-31 DIAGNOSIS — M542 Cervicalgia: Secondary | ICD-10-CM | POA: Diagnosis not present

## 2017-07-31 DIAGNOSIS — G8929 Other chronic pain: Secondary | ICD-10-CM

## 2017-07-31 DIAGNOSIS — M47812 Spondylosis without myelopathy or radiculopathy, cervical region: Secondary | ICD-10-CM | POA: Diagnosis not present

## 2017-07-31 DIAGNOSIS — M25511 Pain in right shoulder: Secondary | ICD-10-CM

## 2017-07-31 NOTE — Progress Notes (Signed)
Office Visit Note   Patient: Luis Guzman           Date of Birth: 1946-02-21           MRN: [id number] Visit Date: 07/31/2017              Requested by: Eulas Post, MD [place], [place] [address] PCP: Eulas Post, MD   Assessment & Plan: Visit Diagnoses:  1. Chronic right shoulder pain     Plan: 2-1/2 weeks status post recurrent rotator cuff tear repair with supplemental derma span patch. Doing well. We'll continue with circumduction exercises and sling and return in 2 weeks at that point consider physical therapy. Will progress slowly as this is a recurrent tear requiring a patch  Follow-Up Instructions: Return in about 2 weeks (around 08/14/2017).   Orders:  No orders of the defined types were placed in this encounter.  No orders of the defined types were placed in this encounter.     Procedures: No procedures performed   Clinical Data: No additional findings.   Subjective: Chief Complaint  Patient presents with  . Right Shoulder - Routine Post Op    [name] is a 71 y o S/P 2w 4d  Right shoulder arthroscopy. Pt wearing sling for safety. Pt has not been driving  No fever or chills. Denies any numbness or tingling. Minimal pain  HPI  Review of Systems  Constitutional: Negative for fatigue.  HENT: Negative for hearing loss.   Respiratory: Negative for apnea, chest tightness and shortness of breath.   Cardiovascular: Negative for chest pain, palpitations and leg swelling.  Gastrointestinal: Negative for blood in stool, constipation and diarrhea.  Genitourinary: Negative for difficulty urinating.  Musculoskeletal: Positive for neck pain and neck stiffness. Negative for arthralgias, back pain, joint swelling and myalgias.  Neurological: Negative for weakness, numbness and headaches.  Hematological: Does not bruise/bleed easily.  Psychiatric/Behavioral: Negative for sleep disturbance. The patient is not nervous/anxious.       Objective: Vital Signs: BP (!) 165/92   Pulse 78   Resp 14   Ht 6\' 2"  (1.88 m)   Wt 210 lb (95.3 kg)   BMI 26.96 kg/m   Physical Exam  Ortho Exam awake alert and oriented 3. Comfortable sitting. Range of motion right shoulder not attempted. Incisions healing without problem. Good grip and good release.  Specialty Comments:  No specialty comments available.  Imaging: No results found.   PMFS History: Patient Active Problem List   Diagnosis Date Noted  . AAA (abdominal aortic aneurysm) without rupture ([place]) 04/15/2016  . Biliary dyskinesia 04/08/2014  . Abdominal pain, epigastric 04/08/2014  . Pancreatitis, acute 03/12/2014  . Pancreatitis 03/12/2014  . Type 2 diabetes mellitus ([place]) 06/25/2012  . ACUTE BRONCHITIS 03/11/2010  . 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 SINUSITIS, ACUTE 06/11/2009  . Hyperlipidemia 01/26/2009  . Essential hypertension 01/26/2009   Past Medical History:  Diagnosis Date  . GERD (gastroesophageal reflux disease)   . HYPERLIPIDEMIA 01/26/2009  . HYPERTENSION 01/26/2009  . 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 lower back pain, s/p cervical neck surgery"stiffness" remains  . Transfusion history    infant "anemia"    Family History  Problem Relation Age of Onset  . Hypertension Mother   . Hypertension Father   . Arthritis Sister        rheumatoid  . Stroke Sister 42    Past Surgical History:  Procedure Laterality Date  .  APPENDECTOMY  ~ 1959  . BACK SURGERY    . CERVICAL LAMINECTOMY  1997   C 4 and C 5  . CHOLECYSTECTOMY N/A 04/15/2014   Procedure: LAPAROSCOPIC CHOLECYSTECTOMY WITH INTRAOPERATIVE CHOLANGIOGRAM;  Surgeon: Armandina Gemma, MD;  Location: [facility];  Service: General;  Laterality: N/A;  . ESOPHAGOGASTRODUODENOSCOPY N/A 03/13/2014   Procedure: ESOPHAGOGASTRODUODENOSCOPY (EGD);  Surgeon: Wonda Horner, MD;  [facility] END[facility]COPY;  Service: Endoscopy;  Laterality: N/A;  . ORIF  FIBULA FRACTURE Left 09/2008   compartment syndrome  . SHOULDER ARTHROSCOPY W/ ROTATOR CUFF REPAIR Bilateral 2004-2009   left 2004, right 2009  . VASECTOMY     Social History   Occupational History  . Not on file  Tobacco Use  . Smoking status: Former Smoker    Packs/day: 1.00    Years: 5.00    Pack years: 5.00    Types: Cigarettes    Last attempt to quit: 10/17/1968    Years since quitting: 48.8  . Smokeless tobacco: Never Used  Substance and Sexual Activity  . Alcohol use: Yes    Comment: "quit drinking ~ 10/1968  . Drug use: No  . Sexual activity: Not Currently

## 2017-08-14 ENCOUNTER — Ambulatory Visit (INDEPENDENT_AMBULATORY_CARE_PROVIDER_SITE_OTHER): Payer: Medicare Other | Admitting: Orthopaedic Surgery

## 2017-08-14 ENCOUNTER — Encounter (INDEPENDENT_AMBULATORY_CARE_PROVIDER_SITE_OTHER): Payer: Self-pay | Admitting: Orthopaedic Surgery

## 2017-08-14 VITALS — BP 150/90 | HR 76 | Resp 18 | Ht 74.0 in | Wt 200.0 lb

## 2017-08-14 DIAGNOSIS — G8929 Other chronic pain: Secondary | ICD-10-CM

## 2017-08-14 DIAGNOSIS — M25511 Pain in right shoulder: Secondary | ICD-10-CM

## 2017-08-14 MED ORDER — TRAMADOL HCL 50 MG PO TABS: ORAL_TABLET | ORAL | 0 refills | Status: DC

## 2017-08-14 NOTE — Addendum Note (Signed)
Addended by: Shona Needles on: 08/14/2017 10:37 AM   Modules accepted: Orders

## 2017-08-14 NOTE — Addendum Note (Signed)
Addended by: Shona Needles on: 08/14/2017 10:20 AM   Modules accepted: Orders

## 2017-08-14 NOTE — Progress Notes (Signed)
Office Visit Note   Patient: Luis Guzman           Date of Birth: 23-Jul-1946           MRN: [id number] Visit Date: 08/14/2017              Requested by: Eulas Post, MD [place], [place] [address] PCP: Eulas Post, MD   Assessment & Plan: Visit Diagnoses:  1. Chronic right shoulder pain     Plan: 4-1/2 weeks status post repeat rotator cuff tear repair with a derma span patch. Doing well in the sling. We'll start physical therapy for passive range of motion. Had adhesive capsulitis at the time of the surgery so I suspect his postoperative course will be slow  Follow-Up Instructions: Return in about 2 weeks (around 08/28/2017).   Orders:  No orders of the defined types were placed in this encounter.  No orders of the defined types were placed in this encounter.     Procedures: No procedures performed   Clinical Data: No additional findings.   Subjective: Chief Complaint  Patient presents with  . Right Shoulder - Routine Post Op    Mr. Hy is 71 y o  4 1/2  weeks status post recurrent rotator cuff tear repair with supplemental derma span patch. Doing well still wearing sling.  No shortness of breath or chest pain. No significant pain  HPI  Review of Systems  Constitutional: Negative for fatigue.  HENT: Negative for hearing loss.   Respiratory: Negative for apnea, chest tightness and shortness of breath.   Cardiovascular: Negative for chest pain, palpitations and leg swelling.  Gastrointestinal: Negative for blood in stool, constipation and diarrhea.  Genitourinary: Negative for difficulty urinating.  Musculoskeletal: Positive for neck pain and neck stiffness. Negative for arthralgias, back pain, joint swelling and myalgias.  Neurological: Negative for weakness, numbness and headaches.  Hematological: Does not bruise/bleed easily.  Psychiatric/Behavioral: Negative for sleep disturbance. The patient is not nervous/anxious.       Objective: Vital Signs: BP (!) 150/90   Pulse 76   Resp 18   Ht 6\' 2"  (1.88 m)   Wt 200 lb (90.7 kg)   BMI 25.68 kg/m   Physical Exam  Ortho Exam awake alert and oriented 3. Comfortable sitting. Incisions right shoulder here without a problem. No swelling or redness. Good grip and good release. Range of motion not attempted  Specialty Comments:  No specialty comments available.  Imaging: No results found.   PMFS History: Patient Active Problem List   Diagnosis Date Noted  . AAA (abdominal aortic aneurysm) without rupture ([place]) 04/15/2016  . Biliary dyskinesia 04/08/2014  . Abdominal pain, epigastric 04/08/2014  . Pancreatitis, acute 03/12/2014  . Pancreatitis 03/12/2014  . Type 2 diabetes mellitus ([place]) 06/25/2012  . ACUTE BRONCHITIS 03/11/2010  . 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 SINUSITIS, ACUTE 06/11/2009  . Hyperlipidemia 01/26/2009  . Essential hypertension 01/26/2009   Past Medical History:  Diagnosis Date  . GERD (gastroesophageal reflux disease)   . HYPERLIPIDEMIA 01/26/2009  . HYPERTENSION 01/26/2009  . 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 lower back pain, s/p cervical neck surgery"stiffness" remains  . Transfusion history    infant "anemia"    Family History  Problem Relation Age of Onset  . Hypertension Mother   . Hypertension Father   . Arthritis Sister        rheumatoid  . Stroke Sister 90    Past  Surgical History:  Procedure Laterality Date  . APPENDECTOMY  ~ 1959  . BACK SURGERY    . CERVICAL LAMINECTOMY  1997   C 4 and C 5  . CHOLECYSTECTOMY N/A 04/15/2014   Procedure: LAPAROSCOPIC CHOLECYSTECTOMY WITH INTRAOPERATIVE CHOLANGIOGRAM;  Surgeon: Armandina Gemma, MD;  Location: [facility];  Service: General;  Laterality: N/A;  . ESOPHAGOGASTRODUODENOSCOPY N/A 03/13/2014   Procedure: ESOPHAGOGASTRODUODENOSCOPY (EGD);  Surgeon: Wonda Horner, MD;  Location: [facility] ENDOSCOPY;  Service: Endoscopy;   Laterality: N/A;  . 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  . SHOULDER ARTHROSCOPY W/ ROTATOR CUFF REPAIR Bilateral 2004-2009   left 2004, right 2009  . VASECTOMY     Social History   Occupational History  . Not on file  Tobacco Use  . Smoking status: Former Smoker    Packs/day: 1.00    Years: 5.00    Pack years: 5.00    Types: Cigarettes    Last attempt to quit: 10/17/1968    Years since quitting: 48.8  . Smokeless tobacco: Never Used  Substance and Sexual Activity  . Alcohol use: Yes    Comment: "quit drinking ~ 10/1968  . Drug use: No  . Sexual activity: Not Currently

## 2017-08-17 DIAGNOSIS — M25511 Pain in right shoulder: Secondary | ICD-10-CM | POA: Diagnosis not present

## 2017-08-17 DIAGNOSIS — R531 Weakness: Secondary | ICD-10-CM | POA: Diagnosis not present

## 2017-08-17 DIAGNOSIS — M25611 Stiffness of right shoulder, not elsewhere classified: Secondary | ICD-10-CM | POA: Diagnosis not present

## 2017-08-17 DIAGNOSIS — M75101 Unspecified rotator cuff tear or rupture of right shoulder, not specified as traumatic: Secondary | ICD-10-CM | POA: Diagnosis not present

## 2017-08-22 DIAGNOSIS — R531 Weakness: Secondary | ICD-10-CM | POA: Diagnosis not present

## 2017-08-22 DIAGNOSIS — M75101 Unspecified rotator cuff tear or rupture of right shoulder, not specified as traumatic: Secondary | ICD-10-CM | POA: Diagnosis not present

## 2017-08-22 DIAGNOSIS — M25511 Pain in right shoulder: Secondary | ICD-10-CM | POA: Diagnosis not present

## 2017-08-22 DIAGNOSIS — M25611 Stiffness of right shoulder, not elsewhere classified: Secondary | ICD-10-CM | POA: Diagnosis not present

## 2017-08-25 DIAGNOSIS — M25511 Pain in right shoulder: Secondary | ICD-10-CM | POA: Diagnosis not present

## 2017-08-25 DIAGNOSIS — R531 Weakness: Secondary | ICD-10-CM | POA: Diagnosis not present

## 2017-08-25 DIAGNOSIS — M25611 Stiffness of right shoulder, not elsewhere classified: Secondary | ICD-10-CM | POA: Diagnosis not present

## 2017-08-25 DIAGNOSIS — M75101 Unspecified rotator cuff tear or rupture of right shoulder, not specified as traumatic: Secondary | ICD-10-CM | POA: Diagnosis not present

## 2017-08-28 DIAGNOSIS — M25611 Stiffness of right shoulder, not elsewhere classified: Secondary | ICD-10-CM | POA: Diagnosis not present

## 2017-08-28 DIAGNOSIS — M25511 Pain in right shoulder: Secondary | ICD-10-CM | POA: Diagnosis not present

## 2017-08-28 DIAGNOSIS — R531 Weakness: Secondary | ICD-10-CM | POA: Diagnosis not present

## 2017-08-28 DIAGNOSIS — M75101 Unspecified rotator cuff tear or rupture of right shoulder, not specified as traumatic: Secondary | ICD-10-CM | POA: Diagnosis not present

## 2017-08-30 DIAGNOSIS — M25511 Pain in right shoulder: Secondary | ICD-10-CM | POA: Diagnosis not present

## 2017-08-30 DIAGNOSIS — M75101 Unspecified rotator cuff tear or rupture of right shoulder, not specified as traumatic: Secondary | ICD-10-CM | POA: Diagnosis not present

## 2017-08-30 DIAGNOSIS — M25611 Stiffness of right shoulder, not elsewhere classified: Secondary | ICD-10-CM | POA: Diagnosis not present

## 2017-08-30 DIAGNOSIS — R531 Weakness: Secondary | ICD-10-CM | POA: Diagnosis not present

## 2017-08-31 ENCOUNTER — Encounter (INDEPENDENT_AMBULATORY_CARE_PROVIDER_SITE_OTHER): Payer: Self-pay | Admitting: Orthopaedic Surgery

## 2017-08-31 ENCOUNTER — Ambulatory Visit (INDEPENDENT_AMBULATORY_CARE_PROVIDER_SITE_OTHER): Payer: Medicare Other | Admitting: Orthopaedic Surgery

## 2017-08-31 VITALS — BP 138/79 | HR 62 | Resp 14 | Ht 74.0 in | Wt 220.0 lb

## 2017-08-31 DIAGNOSIS — M25511 Pain in right shoulder: Secondary | ICD-10-CM

## 2017-08-31 DIAGNOSIS — G8929 Other chronic pain: Secondary | ICD-10-CM

## 2017-08-31 NOTE — Progress Notes (Signed)
Office Visit Note   Patient: Luis Guzman           Date of Birth: 06/06/1946           MRN: [id number] Visit Date: 08/31/2017              Requested by: Eulas Post, MD [place], [place] [address] PCP: Eulas Post, MD   Assessment & Plan: Visit Diagnoses:  1. Chronic right shoulder pain     Plan: 6-1/2 weeks status post repeat rotator cuff tear repair right shoulder with application of the dermaspan patch and doing well. Has started physical therapy for gentle passive range of motion. At this point they'll progress with the therapy. He can discontinue the sling. I'll check him back in a month. He did have evidence of adhesive capsulitis time of shoulder evaluation prior to the latest surgery so I suspect he'll have some difficulty regaining overhead motion  Follow-Up Instructions: Return in about 1 month (around 10/01/2017).   Orders:  No orders of the defined types were placed in this encounter.  No orders of the defined types were placed in this encounter.     Procedures: No procedures performed   Clinical Data: No additional findings.   Subjective: Chief Complaint  Patient presents with  . Right Shoulder - Routine Post Op    Luis Guzman is a 72 y o S/P right shoulder RCT repair. (2nd).  He relates very little pain and ongoing PT. He wears sling when in public  Luis Guzman relates that this surgery was a lot easier than the first surgery is not having nearly as much pain  HPI  Review of Systems  Constitutional: Negative for fatigue.  HENT: Negative for hearing loss.   Respiratory: Negative for apnea, chest tightness and shortness of breath.   Cardiovascular: Negative for chest pain, palpitations and leg swelling.  Gastrointestinal: Negative for blood in stool, constipation and diarrhea.  Genitourinary: Negative for difficulty urinating.  Musculoskeletal: Positive for myalgias, neck pain and neck stiffness. Negative for arthralgias, back  pain and joint swelling.  Neurological: Positive for headaches. Negative for weakness and numbness.  Hematological: Does not bruise/bleed easily.  Psychiatric/Behavioral: Positive for sleep disturbance. The patient is not nervous/anxious.      Objective: Vital Signs: BP 138/79   Pulse 62   Resp 14   Ht 6\' 2"  (1.88 m)   Wt 220 lb (99.8 kg)   BMI 28.25 kg/m   Physical Exam  Ortho Exam awake alert and oriented 3. Comfortable sitting. Incisions right shoulder. Without problem. Neurovascular exam intact. Range of motion not attempted. We'll check back in the office in 1 month and continue physical therapy  Specialty Comments:  No specialty comments available.  Imaging: No results found.   PMFS History: Patient Active Problem List   Diagnosis Date Noted  . AAA (abdominal aortic aneurysm) without rupture ([place]) 04/15/2016  . Biliary dyskinesia 04/08/2014  . Abdominal pain, epigastric 04/08/2014  . Pancreatitis, acute 03/12/2014  . Pancreatitis 03/12/2014  . Type 2 diabetes mellitus ([place]) 06/25/2012  . ACUTE BRONCHITIS 03/11/2010  . 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 SINUSITIS, ACUTE 06/11/2009  . Hyperlipidemia 01/26/2009  . Essential hypertension 01/26/2009   Past Medical History:  Diagnosis Date  . GERD (gastroesophageal reflux disease)   . HYPERLIPIDEMIA 01/26/2009  . HYPERTENSION 01/26/2009  . 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 lower back pain, s/p cervical neck surgery"stiffness" remains  . Transfusion history  infant "anemia"    Family History  Problem Relation Age of Onset  . Hypertension Mother   . Hypertension Father   . Arthritis Sister        rheumatoid  . Stroke Sister 17    Past Surgical History:  Procedure Laterality Date  . APPENDECTOMY  ~ 1959  . BACK SURGERY    . CERVICAL LAMINECTOMY  1997   C 4 and C 5  . CHOLECYSTECTOMY N/A 04/15/2014   Procedure: LAPAROSCOPIC CHOLECYSTECTOMY WITH  INTRAOPERATIVE CHOLANGIOGRAM;  Surgeon: Armandina Gemma, MD;  Location: [facility];  Service: General;  Laterality: N/A;  . ESOPHAGOGASTRODUODENOSCOPY N/A 03/13/2014   Procedure: ESOPHAGOGASTRODUODENOSCOPY (EGD);  Surgeon: Wonda Horner, MD;  Location: [facility] ENDOSCOPY;  Service: Endoscopy;  Laterality: N/A;  . 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  . SHOULDER ARTHROSCOPY W/ ROTATOR CUFF REPAIR Bilateral 2004-2009   left 2004, right 2009  . VASECTOMY     Social History   Occupational History  . Not on file  Tobacco Use  . Smoking status: Former Smoker    Packs/day: 1.00    Years: 5.00    Pack years: 5.00    Types: Cigarettes    Last attempt to quit: 10/17/1968    Years since quitting: 48.9  . Smokeless tobacco: Never Used  Substance and Sexual Activity  . Alcohol use: Yes    Comment: "quit drinking ~ 10/1968  . Drug use: No  . Sexual activity: Not Currently

## 2017-09-01 ENCOUNTER — Ambulatory Visit (INDEPENDENT_AMBULATORY_CARE_PROVIDER_SITE_OTHER): Payer: Medicare Other | Admitting: Orthopaedic Surgery

## 2017-09-04 DIAGNOSIS — M25511 Pain in right shoulder: Secondary | ICD-10-CM | POA: Diagnosis not present

## 2017-09-04 DIAGNOSIS — M25611 Stiffness of right shoulder, not elsewhere classified: Secondary | ICD-10-CM | POA: Diagnosis not present

## 2017-09-04 DIAGNOSIS — M75101 Unspecified rotator cuff tear or rupture of right shoulder, not specified as traumatic: Secondary | ICD-10-CM | POA: Diagnosis not present

## 2017-09-04 DIAGNOSIS — R531 Weakness: Secondary | ICD-10-CM | POA: Diagnosis not present

## 2017-09-06 DIAGNOSIS — R531 Weakness: Secondary | ICD-10-CM | POA: Diagnosis not present

## 2017-09-06 DIAGNOSIS — M25611 Stiffness of right shoulder, not elsewhere classified: Secondary | ICD-10-CM | POA: Diagnosis not present

## 2017-09-06 DIAGNOSIS — M25511 Pain in right shoulder: Secondary | ICD-10-CM | POA: Diagnosis not present

## 2017-09-06 DIAGNOSIS — M75101 Unspecified rotator cuff tear or rupture of right shoulder, not specified as traumatic: Secondary | ICD-10-CM | POA: Diagnosis not present

## 2017-09-12 DIAGNOSIS — M25511 Pain in right shoulder: Secondary | ICD-10-CM | POA: Diagnosis not present

## 2017-09-12 DIAGNOSIS — M75101 Unspecified rotator cuff tear or rupture of right shoulder, not specified as traumatic: Secondary | ICD-10-CM | POA: Diagnosis not present

## 2017-09-12 DIAGNOSIS — R531 Weakness: Secondary | ICD-10-CM | POA: Diagnosis not present

## 2017-09-12 DIAGNOSIS — M25611 Stiffness of right shoulder, not elsewhere classified: Secondary | ICD-10-CM | POA: Diagnosis not present

## 2017-09-15 DIAGNOSIS — R531 Weakness: Secondary | ICD-10-CM | POA: Diagnosis not present

## 2017-09-15 DIAGNOSIS — M25511 Pain in right shoulder: Secondary | ICD-10-CM | POA: Diagnosis not present

## 2017-09-15 DIAGNOSIS — M75101 Unspecified rotator cuff tear or rupture of right shoulder, not specified as traumatic: Secondary | ICD-10-CM | POA: Diagnosis not present

## 2017-09-15 DIAGNOSIS — M25611 Stiffness of right shoulder, not elsewhere classified: Secondary | ICD-10-CM | POA: Diagnosis not present

## 2017-09-18 DIAGNOSIS — R531 Weakness: Secondary | ICD-10-CM | POA: Diagnosis not present

## 2017-09-18 DIAGNOSIS — M75101 Unspecified rotator cuff tear or rupture of right shoulder, not specified as traumatic: Secondary | ICD-10-CM | POA: Diagnosis not present

## 2017-09-18 DIAGNOSIS — M25611 Stiffness of right shoulder, not elsewhere classified: Secondary | ICD-10-CM | POA: Diagnosis not present

## 2017-09-18 DIAGNOSIS — M25511 Pain in right shoulder: Secondary | ICD-10-CM | POA: Diagnosis not present

## 2017-09-20 DIAGNOSIS — R531 Weakness: Secondary | ICD-10-CM | POA: Diagnosis not present

## 2017-09-20 DIAGNOSIS — M75101 Unspecified rotator cuff tear or rupture of right shoulder, not specified as traumatic: Secondary | ICD-10-CM | POA: Diagnosis not present

## 2017-09-20 DIAGNOSIS — M25611 Stiffness of right shoulder, not elsewhere classified: Secondary | ICD-10-CM | POA: Diagnosis not present

## 2017-09-20 DIAGNOSIS — M25511 Pain in right shoulder: Secondary | ICD-10-CM | POA: Diagnosis not present

## 2017-09-25 DIAGNOSIS — R531 Weakness: Secondary | ICD-10-CM | POA: Diagnosis not present

## 2017-09-25 DIAGNOSIS — M25511 Pain in right shoulder: Secondary | ICD-10-CM | POA: Diagnosis not present

## 2017-09-25 DIAGNOSIS — M75101 Unspecified rotator cuff tear or rupture of right shoulder, not specified as traumatic: Secondary | ICD-10-CM | POA: Diagnosis not present

## 2017-09-25 DIAGNOSIS — M25611 Stiffness of right shoulder, not elsewhere classified: Secondary | ICD-10-CM | POA: Diagnosis not present

## 2017-09-27 DIAGNOSIS — M25611 Stiffness of right shoulder, not elsewhere classified: Secondary | ICD-10-CM | POA: Diagnosis not present

## 2017-09-27 DIAGNOSIS — R531 Weakness: Secondary | ICD-10-CM | POA: Diagnosis not present

## 2017-09-27 DIAGNOSIS — M75101 Unspecified rotator cuff tear or rupture of right shoulder, not specified as traumatic: Secondary | ICD-10-CM | POA: Diagnosis not present

## 2017-09-27 DIAGNOSIS — M25511 Pain in right shoulder: Secondary | ICD-10-CM | POA: Diagnosis not present

## 2017-09-28 ENCOUNTER — Encounter (INDEPENDENT_AMBULATORY_CARE_PROVIDER_SITE_OTHER): Payer: Self-pay | Admitting: Orthopaedic Surgery

## 2017-09-28 ENCOUNTER — Ambulatory Visit (INDEPENDENT_AMBULATORY_CARE_PROVIDER_SITE_OTHER): Payer: Medicare Other | Admitting: Orthopaedic Surgery

## 2017-09-28 VITALS — BP 145/77 | HR 64 | Resp 14 | Ht 72.0 in | Wt 210.0 lb

## 2017-09-28 DIAGNOSIS — M542 Cervicalgia: Secondary | ICD-10-CM | POA: Diagnosis not present

## 2017-09-28 DIAGNOSIS — I10 Essential (primary) hypertension: Secondary | ICD-10-CM | POA: Diagnosis not present

## 2017-09-28 DIAGNOSIS — M47812 Spondylosis without myelopathy or radiculopathy, cervical region: Secondary | ICD-10-CM | POA: Diagnosis not present

## 2017-09-28 DIAGNOSIS — M75101 Unspecified rotator cuff tear or rupture of right shoulder, not specified as traumatic: Secondary | ICD-10-CM

## 2017-09-28 NOTE — Progress Notes (Signed)
Office Visit Note   Patient: Luis Guzman           Date of Birth: 24-May-1946           MRN: [id number] Visit Date: 09/28/2017              Requested by: Eulas Post, MD [place], [place] [address] PCP: Eulas Post, MD   Assessment & Plan: Visit Diagnoses:  1. Rotator cuff tear, non-traumatic, right     Plan:  #1: He can continue to wean out of his shoulder immobilizer.  He states he is only using it when he is going out. #2 continue his physical therapy and they will then begin further strengthening program for him.  Follow-Up Instructions: Return in about 3 weeks (around 10/19/2017).   Orders:  No orders of the defined types were placed in this encounter.  No orders of the defined types were placed in this encounter.     Procedures: No procedures performed   Clinical Data: No additional findings.   Subjective: Chief Complaint  Patient presents with  . Right Shoulder - Routine Post Op    Luis Guzman is a 73 y o S/P 10 1/2 weeks right shoulder arthroscopy. He relates no pain in his shoulder, wears sling in public. He relates he went to see Dr. Roselee Culver (neuro)    HPI  Luis Guzman is a 72 year old white male who is seen today now about 11 weeks status post right shoulder rotator cuff repair with dermaspan patch.  He is actually doing very well.  Minimal discomfort.  He states that this surgery is going extremely well.  Seen today for evaluation.  Pain is well controlled.  She apparently is seeing Dr. Clarice Pole group and was having cervical injections.  He has been tried on multiple modalities and has not had great results.  Since that is occurred Dr. Maryjean Ka has scheduled him for an MRI scan and then he can follow back up with Dr. Clarice Pole if he becomes a surgical candidate or if indicated further injections.  Review of Systems  Constitutional: Negative for fatigue.  HENT: Negative for hearing loss.   Respiratory: Negative for apnea, chest  tightness and shortness of breath.   Cardiovascular: Negative for chest pain, palpitations and leg swelling.  Gastrointestinal: Negative for blood in stool, constipation and diarrhea.  Genitourinary: Negative for difficulty urinating.  Musculoskeletal: Positive for neck pain and neck stiffness. Negative for arthralgias, joint swelling and myalgias.  Neurological: Negative for weakness, numbness and headaches.  Hematological: Does not bruise/bleed easily.  Psychiatric/Behavioral: Negative for sleep disturbance. The patient is not nervous/anxious.      Objective: Vital Signs: BP (!) 145/77   Pulse 64   Resp 14   Ht 6' (1.829 m)   Wt 210 lb (95.3 kg)   BMI 28.48 kg/m   Physical Exam  Constitutional: He is oriented to person, place, and time. He appears well-developed and well-nourished.  HENT:  Head: Normocephalic and atraumatic.  Eyes: EOM are normal. Pupils are equal, round, and reactive to light.  Pulmonary/Chest: Effort normal.  Neurological: He is alert and oriented to person, place, and time.  Skin: Skin is warm and dry.  Psychiatric: He has a normal mood and affect. His behavior is normal. Judgment and thought content normal.    Ortho Exam  Exam today reveals the right shoulder with a well-healed surgical incision.  He actively abduction forward flex the shoulder at this time.  I can  get him to about 130 degrees of forward flexion 90-95 degrees of abduction.  With the arm at 90 degrees of abduction he is got around 45-50 degrees of external rotation only about 20-30 degrees of internal rotation.  He is able to resist me in all planes.  No pain with range of motion.  Specialty Comments:  No specialty comments available.  Imaging: No results found.   PMFS History: Current Outpatient Medications  Medication Sig Dispense Refill  . diclofenac sodium (VOLTAREN) 1 % GEL Apply 2 g topically 4 (four) times daily. 3 Tube 1  . hydrochlorothiazide (MICROZIDE) 12.5 MG capsule Take  1 capsule (12.5 mg total) by mouth daily. 90 capsule 3  . losartan (COZAAR) 100 MG tablet Take 1 tablet (100 mg total) by mouth daily. 90 tablet 3  . metoprolol succinate (TOPROL-XL) 50 MG 24 hr tablet TAKE 1 TABLET BY MOUTH  DAILY WITH OR IMMEDIATLEY  FOLLOWING A MEAL 90 tablet 3  . oxyCODONE-acetaminophen (PERCOCET) 10-325 MG per tablet Take 1 tablet by mouth every 6 (six) hours as needed for pain.    [name] tiZANidine (ZANAFLEX) 4 MG tablet TAKE 1 TABLET BY MOUTH EVERY 8 HOURS AS NEEDED NO MORE THAN 3 DOSES IN 24 HOURS  2  . traMADol (ULTRAM) 50 MG tablet 1-2 tablets po bid prn 30 tablet 0   No current facility-administered medications for this visit.     Patient Active Problem List   Diagnosis Date Noted  . AAA (abdominal aortic aneurysm) without rupture ([place]) 04/15/2016  . Biliary dyskinesia 04/08/2014  . Abdominal pain, epigastric 04/08/2014  . Pancreatitis, acute 03/12/2014  . Pancreatitis 03/12/2014  . Type 2 diabetes mellitus ([place]) 06/25/2012  . ACUTE BRONCHITIS 03/11/2010  . 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 SINUSITIS, ACUTE 06/11/2009  . Hyperlipidemia 01/26/2009  . Essential hypertension 01/26/2009   Past Medical History:  Diagnosis Date  . GERD (gastroesophageal reflux disease)   . HYPERLIPIDEMIA 01/26/2009  . HYPERTENSION 01/26/2009  . 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 lower back pain, s/p cervical neck surgery"stiffness" remains  . Transfusion history    infant "anemia"    Family History  Problem Relation Age of Onset  . Hypertension Mother   . Hypertension Father   . Arthritis Sister        rheumatoid  . Stroke Sister 52    Past Surgical History:  Procedure Laterality Date  . APPENDECTOMY  ~ 1959  . BACK SURGERY    . CERVICAL LAMINECTOMY  1997   C 4 and C 5  . CHOLECYSTECTOMY N/A 04/15/2014   Procedure: LAPAROSCOPIC CHOLECYSTECTOMY WITH INTRAOPERATIVE CHOLANGIOGRAM;  Surgeon: Armandina Gemma, MD;  Location: [facility]  ORS;  Service: General;  Laterality: N/A;  . ESOPHAGOGASTRODUODENOSCOPY N/A 03/13/2014   Procedure: ESOPHAGOGASTRODUODENOSCOPY (EGD);  Surgeon: Wonda Horner, MD;  Location: [facility] ENDOSCOPY;  Service: Endoscopy;  Laterality: N/A;  . 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  . SHOULDER ARTHROSCOPY W/ ROTATOR CUFF REPAIR Bilateral 2004-2009   left 2004, right 2009  . VASECTOMY     Social History   Occupational History  . Not on file  Tobacco Use  . Smoking status: Former Smoker    Packs/day: 1.00    Years: 5.00    Pack years: 5.00    Types: Cigarettes    Last attempt to quit: 10/17/1968    Years since quitting: 48.9  . Smokeless tobacco: Never Used  Substance and Sexual Activity  . Alcohol use: Yes  Comment: "quit drinking ~ 10/1968  . Drug use: No  . Sexual activity: Not Currently

## 2017-10-02 ENCOUNTER — Ambulatory Visit (INDEPENDENT_AMBULATORY_CARE_PROVIDER_SITE_OTHER): Payer: Medicare Other | Admitting: Orthopaedic Surgery

## 2017-10-02 DIAGNOSIS — M25611 Stiffness of right shoulder, not elsewhere classified: Secondary | ICD-10-CM | POA: Diagnosis not present

## 2017-10-02 DIAGNOSIS — M25511 Pain in right shoulder: Secondary | ICD-10-CM | POA: Diagnosis not present

## 2017-10-02 DIAGNOSIS — M75101 Unspecified rotator cuff tear or rupture of right shoulder, not specified as traumatic: Secondary | ICD-10-CM | POA: Diagnosis not present

## 2017-10-02 DIAGNOSIS — R531 Weakness: Secondary | ICD-10-CM | POA: Diagnosis not present

## 2017-10-04 DIAGNOSIS — M75101 Unspecified rotator cuff tear or rupture of right shoulder, not specified as traumatic: Secondary | ICD-10-CM | POA: Diagnosis not present

## 2017-10-04 DIAGNOSIS — M25611 Stiffness of right shoulder, not elsewhere classified: Secondary | ICD-10-CM | POA: Diagnosis not present

## 2017-10-04 DIAGNOSIS — R531 Weakness: Secondary | ICD-10-CM | POA: Diagnosis not present

## 2017-10-04 DIAGNOSIS — M25511 Pain in right shoulder: Secondary | ICD-10-CM | POA: Diagnosis not present

## 2017-10-05 ENCOUNTER — Other Ambulatory Visit: Payer: Self-pay | Admitting: Neurosurgery

## 2017-10-05 DIAGNOSIS — M542 Cervicalgia: Secondary | ICD-10-CM

## 2017-10-08 ENCOUNTER — Ambulatory Visit
Admission: RE | Admit: 2017-10-08 | Discharge: 2017-10-08 | Disposition: A | Discharge status: Home / Self Care | Payer: Medicare Other | Source: Ambulatory Visit | Attending: Neurosurgery | Admitting: Neurosurgery

## 2017-10-08 DIAGNOSIS — M4802 Spinal stenosis, cervical region: Secondary | ICD-10-CM | POA: Diagnosis not present

## 2017-10-08 DIAGNOSIS — M542 Cervicalgia: Secondary | ICD-10-CM

## 2017-10-10 DIAGNOSIS — M25511 Pain in right shoulder: Secondary | ICD-10-CM | POA: Diagnosis not present

## 2017-10-10 DIAGNOSIS — M25611 Stiffness of right shoulder, not elsewhere classified: Secondary | ICD-10-CM | POA: Diagnosis not present

## 2017-10-10 DIAGNOSIS — M75101 Unspecified rotator cuff tear or rupture of right shoulder, not specified as traumatic: Secondary | ICD-10-CM | POA: Diagnosis not present

## 2017-10-12 DIAGNOSIS — M25611 Stiffness of right shoulder, not elsewhere classified: Secondary | ICD-10-CM | POA: Diagnosis not present

## 2017-10-12 DIAGNOSIS — M25511 Pain in right shoulder: Secondary | ICD-10-CM | POA: Diagnosis not present

## 2017-10-12 DIAGNOSIS — M75101 Unspecified rotator cuff tear or rupture of right shoulder, not specified as traumatic: Secondary | ICD-10-CM | POA: Diagnosis not present

## 2017-10-17 DIAGNOSIS — M75101 Unspecified rotator cuff tear or rupture of right shoulder, not specified as traumatic: Secondary | ICD-10-CM | POA: Diagnosis not present

## 2017-10-17 DIAGNOSIS — M25611 Stiffness of right shoulder, not elsewhere classified: Secondary | ICD-10-CM | POA: Diagnosis not present

## 2017-10-17 DIAGNOSIS — M25511 Pain in right shoulder: Secondary | ICD-10-CM | POA: Diagnosis not present

## 2017-10-23 DIAGNOSIS — M25511 Pain in right shoulder: Secondary | ICD-10-CM | POA: Diagnosis not present

## 2017-10-23 DIAGNOSIS — M75101 Unspecified rotator cuff tear or rupture of right shoulder, not specified as traumatic: Secondary | ICD-10-CM | POA: Diagnosis not present

## 2017-10-23 DIAGNOSIS — M25611 Stiffness of right shoulder, not elsewhere classified: Secondary | ICD-10-CM | POA: Diagnosis not present

## 2017-11-01 DIAGNOSIS — M25511 Pain in right shoulder: Secondary | ICD-10-CM | POA: Diagnosis not present

## 2017-11-01 DIAGNOSIS — M25611 Stiffness of right shoulder, not elsewhere classified: Secondary | ICD-10-CM | POA: Diagnosis not present

## 2017-11-01 DIAGNOSIS — M75101 Unspecified rotator cuff tear or rupture of right shoulder, not specified as traumatic: Secondary | ICD-10-CM | POA: Diagnosis not present

## 2017-11-07 DIAGNOSIS — M25511 Pain in right shoulder: Secondary | ICD-10-CM | POA: Diagnosis not present

## 2017-11-07 DIAGNOSIS — M75101 Unspecified rotator cuff tear or rupture of right shoulder, not specified as traumatic: Secondary | ICD-10-CM | POA: Diagnosis not present

## 2017-11-07 DIAGNOSIS — M25611 Stiffness of right shoulder, not elsewhere classified: Secondary | ICD-10-CM | POA: Diagnosis not present

## 2017-11-08 DIAGNOSIS — M47812 Spondylosis without myelopathy or radiculopathy, cervical region: Secondary | ICD-10-CM | POA: Diagnosis not present

## 2017-11-08 DIAGNOSIS — M542 Cervicalgia: Secondary | ICD-10-CM | POA: Diagnosis not present

## 2017-11-08 DIAGNOSIS — I10 Essential (primary) hypertension: Secondary | ICD-10-CM | POA: Diagnosis not present

## 2017-11-27 ENCOUNTER — Encounter (INDEPENDENT_AMBULATORY_CARE_PROVIDER_SITE_OTHER): Payer: Self-pay | Admitting: Orthopaedic Surgery

## 2017-11-27 ENCOUNTER — Ambulatory Visit (INDEPENDENT_AMBULATORY_CARE_PROVIDER_SITE_OTHER): Payer: Medicare Other | Admitting: Orthopaedic Surgery

## 2017-11-27 VITALS — BP 137/82 | HR 73 | Resp 14 | Ht 72.0 in | Wt 184.6 lb

## 2017-11-27 DIAGNOSIS — G8929 Other chronic pain: Secondary | ICD-10-CM

## 2017-11-27 DIAGNOSIS — M25511 Pain in right shoulder: Secondary | ICD-10-CM

## 2017-11-27 NOTE — Progress Notes (Signed)
Office Visit Note   Patient: Luis Guzman           Date of Birth: 1946-06-20           MRN: [id number] Visit Date: 11/27/2017              Requested by: Eulas Post, MD [place], [place] [address] PCP: Eulas Post, MD   Assessment & Plan: Visit Diagnoses:  1. Chronic right shoulder pain     Plan: 4-1/2 months status post repeat rotator cuff tear repair with supplemental patch.  Doing very well.  No pain.  No loss of function.  Continue with home exercise program and return as needed Follow-Up Instructions: Return if symptoms worsen or fail to improve.   Orders:  No orders of the defined types were placed in this encounter.  No orders of the defined types were placed in this encounter.     Procedures: No procedures performed   Clinical Data: No additional findings.   Subjective: Chief Complaint  Patient presents with  . Right Shoulder - Follow-up  . Shoulder Pain    02/2017, 07/13/2017 RCT, doing good  No related problems.  Working without an issue.  Has chronic problems with the cervical spine being followed by Dr. Ellene Route.  Shoulder doing well  HPI  Review of Systems  Constitutional: Positive for activity change and fatigue.  HENT: Negative for trouble swallowing.   Eyes: Negative for pain.  Respiratory: Negative for shortness of breath.   Cardiovascular: Negative for leg swelling.  Gastrointestinal: Negative for constipation.  Endocrine: Negative for cold intolerance.  Genitourinary: Negative for difficulty urinating.  Musculoskeletal: Positive for neck pain.  Skin: Negative for rash.  Allergic/Immunologic: Negative for food allergies.  Neurological: Negative for numbness.  Hematological: Does not bruise/bleed easily.  Psychiatric/Behavioral: Negative for sleep disturbance.     Objective: Vital Signs: BP 137/82 (BP Location: Left Arm, Patient Position: Sitting, Cuff Size: Normal)   Pulse 73   Resp 14   Ht 6' (1.829  m)   Wt 184 lb 9.6 oz (83.7 kg)   BMI 25.04 kg/m   Physical Exam  Ortho Exam awake alert and oriented x3.  Comfortable sitting.  Able to raise his right arm over his head quickly.  He notes having an occasional ache but no significant pain.  Good strength.  Negative impingement.  Negative empty can.  No swelling distally.  Vascular exam intact  Specialty Comments:  No specialty comments available.  Imaging: No results found.   PMFS History: Patient Active Problem List   Diagnosis Date Noted  . AAA (abdominal aortic aneurysm) without rupture ([place]) 04/15/2016  . Biliary dyskinesia 04/08/2014  . Abdominal pain, epigastric 04/08/2014  . Pancreatitis, acute 03/12/2014  . Pancreatitis 03/12/2014  . Type 2 diabetes mellitus ([place]) 06/25/2012  . ACUTE BRONCHITIS 03/11/2010  . 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 SINUSITIS, ACUTE 06/11/2009  . Hyperlipidemia 01/26/2009  . Essential hypertension 01/26/2009   Past Medical History:  Diagnosis Date  . GERD (gastroesophageal reflux disease)   . HYPERLIPIDEMIA 01/26/2009  . HYPERTENSION 01/26/2009  . 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 lower back pain, s/p cervical neck surgery"stiffness" remains  . Transfusion history    infant "anemia"    Family History  Problem Relation Age of Onset  . Hypertension Mother   . Hypertension Father   . Arthritis Sister        rheumatoid  . Stroke Sister 2  Past Surgical History:  Procedure Laterality Date  . APPENDECTOMY  ~ 1959  . BACK SURGERY    . CERVICAL LAMINECTOMY  1997   C 4 and C 5  . CHOLECYSTECTOMY N/A 04/15/2014   Procedure: LAPAROSCOPIC CHOLECYSTECTOMY WITH INTRAOPERATIVE CHOLANGIOGRAM;  Surgeon: Armandina Gemma, MD;  Location: [facility];  Service: General;  Laterality: N/A;  . ESOPHAGOGASTRODUODENOSCOPY N/A 03/13/2014   Procedure: ESOPHAGOGASTRODUODENOSCOPY (EGD);  Surgeon: Wonda Horner, MD;  Location: [facility] ENDOSCOPY;  Service: Endoscopy;   Laterality: N/A;  . 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  . SHOULDER ARTHROSCOPY    . SHOULDER ARTHROSCOPY W/ ROTATOR CUFF REPAIR Bilateral 2004-2009   left 2004, right 2009  . VASECTOMY     Social History   Occupational History  . Not on file  Tobacco Use  . Smoking status: Former Smoker    Packs/day: 1.00    Years: 5.00    Pack years: 5.00    Types: Cigarettes    Last attempt to quit: 10/17/1968    Years since quitting: 49.1  . Smokeless tobacco: Never Used  Substance and Sexual Activity  . Alcohol use: Not Currently    Comment: "quit drinking ~ 10/1968  . Drug use: No  . Sexual activity: Not Currently

## 2018-01-30 DIAGNOSIS — I10 Essential (primary) hypertension: Secondary | ICD-10-CM | POA: Diagnosis not present

## 2018-01-30 DIAGNOSIS — M47812 Spondylosis without myelopathy or radiculopathy, cervical region: Secondary | ICD-10-CM | POA: Diagnosis not present

## 2018-02-20 DIAGNOSIS — M47812 Spondylosis without myelopathy or radiculopathy, cervical region: Secondary | ICD-10-CM | POA: Diagnosis not present

## 2018-02-20 DIAGNOSIS — M4302 Spondylolysis, cervical region: Secondary | ICD-10-CM | POA: Diagnosis not present

## 2018-02-20 DIAGNOSIS — M5412 Radiculopathy, cervical region: Secondary | ICD-10-CM | POA: Diagnosis not present

## 2018-02-20 DIAGNOSIS — I10 Essential (primary) hypertension: Secondary | ICD-10-CM | POA: Diagnosis not present

## 2018-02-28 DIAGNOSIS — M5412 Radiculopathy, cervical region: Secondary | ICD-10-CM | POA: Diagnosis not present

## 2018-02-28 DIAGNOSIS — M542 Cervicalgia: Secondary | ICD-10-CM | POA: Diagnosis not present

## 2018-02-28 DIAGNOSIS — M4302 Spondylolysis, cervical region: Secondary | ICD-10-CM | POA: Diagnosis not present

## 2018-03-07 DIAGNOSIS — M542 Cervicalgia: Secondary | ICD-10-CM | POA: Diagnosis not present

## 2018-03-07 DIAGNOSIS — M4302 Spondylolysis, cervical region: Secondary | ICD-10-CM | POA: Diagnosis not present

## 2018-03-07 DIAGNOSIS — M5412 Radiculopathy, cervical region: Secondary | ICD-10-CM | POA: Diagnosis not present

## 2018-03-09 DIAGNOSIS — M542 Cervicalgia: Secondary | ICD-10-CM | POA: Diagnosis not present

## 2018-03-09 DIAGNOSIS — M4302 Spondylolysis, cervical region: Secondary | ICD-10-CM | POA: Diagnosis not present

## 2018-03-09 DIAGNOSIS — M5412 Radiculopathy, cervical region: Secondary | ICD-10-CM | POA: Diagnosis not present

## 2018-03-13 DIAGNOSIS — M4302 Spondylolysis, cervical region: Secondary | ICD-10-CM | POA: Diagnosis not present

## 2018-03-13 DIAGNOSIS — M542 Cervicalgia: Secondary | ICD-10-CM | POA: Diagnosis not present

## 2018-03-13 DIAGNOSIS — M5412 Radiculopathy, cervical region: Secondary | ICD-10-CM | POA: Diagnosis not present

## 2018-03-19 DIAGNOSIS — M542 Cervicalgia: Secondary | ICD-10-CM | POA: Diagnosis not present

## 2018-03-19 DIAGNOSIS — M5412 Radiculopathy, cervical region: Secondary | ICD-10-CM | POA: Diagnosis not present

## 2018-03-19 DIAGNOSIS — M4302 Spondylolysis, cervical region: Secondary | ICD-10-CM | POA: Diagnosis not present

## 2018-04-05 DIAGNOSIS — M4302 Spondylolysis, cervical region: Secondary | ICD-10-CM | POA: Diagnosis not present

## 2018-04-05 DIAGNOSIS — M5412 Radiculopathy, cervical region: Secondary | ICD-10-CM | POA: Diagnosis not present

## 2018-04-05 DIAGNOSIS — I10 Essential (primary) hypertension: Secondary | ICD-10-CM | POA: Diagnosis not present

## 2018-04-05 DIAGNOSIS — Z6825 Body mass index (BMI) 25.0-25.9, adult: Secondary | ICD-10-CM | POA: Diagnosis not present

## 2018-04-06 ENCOUNTER — Other Ambulatory Visit: Payer: Self-pay | Admitting: Neurological Surgery

## 2018-04-06 DIAGNOSIS — H40012 Open angle with borderline findings, low risk, left eye: Secondary | ICD-10-CM | POA: Diagnosis not present

## 2018-04-23 ENCOUNTER — Ambulatory Visit: Payer: Medicare Other

## 2018-04-23 ENCOUNTER — Ambulatory Visit: Payer: Medicare Other | Admitting: Family Medicine

## 2018-04-26 ENCOUNTER — Other Ambulatory Visit (INDEPENDENT_AMBULATORY_CARE_PROVIDER_SITE_OTHER): Payer: Self-pay | Admitting: Radiology

## 2018-04-26 ENCOUNTER — Ambulatory Visit (INDEPENDENT_AMBULATORY_CARE_PROVIDER_SITE_OTHER): Payer: Medicare Other

## 2018-04-26 ENCOUNTER — Encounter (INDEPENDENT_AMBULATORY_CARE_PROVIDER_SITE_OTHER): Payer: Self-pay | Admitting: Orthopaedic Surgery

## 2018-04-26 ENCOUNTER — Ambulatory Visit (INDEPENDENT_AMBULATORY_CARE_PROVIDER_SITE_OTHER): Payer: Medicare Other | Admitting: Orthopaedic Surgery

## 2018-04-26 VITALS — BP 146/74 | HR 78 | Resp 16 | Ht 72.0 in | Wt 181.4 lb

## 2018-04-26 DIAGNOSIS — M25551 Pain in right hip: Secondary | ICD-10-CM | POA: Diagnosis not present

## 2018-04-26 DIAGNOSIS — M4150 Other secondary scoliosis, site unspecified: Secondary | ICD-10-CM | POA: Diagnosis not present

## 2018-04-26 DIAGNOSIS — M1611 Unilateral primary osteoarthritis, right hip: Secondary | ICD-10-CM | POA: Diagnosis not present

## 2018-04-26 DIAGNOSIS — M5441 Lumbago with sciatica, right side: Secondary | ICD-10-CM

## 2018-04-26 DIAGNOSIS — M4180 Other forms of scoliosis, site unspecified: Secondary | ICD-10-CM

## 2018-04-26 DIAGNOSIS — G8929 Other chronic pain: Secondary | ICD-10-CM

## 2018-04-26 NOTE — Progress Notes (Signed)
Office Visit Note   Patient: Luis Guzman           Date of Birth: 01/04/1946           MRN: [id number] Visit Date: 04/26/2018              Requested by: Eulas Post, MD [place], [place] [address] PCP: Eulas Post, MD   Assessment & Plan: Visit Diagnoses:  1. Unilateral primary osteoarthritis, right hip   2. Pain in right hip   3. Chronic midline low back pain with right-sided sciatica   4. Degenerative scoliosis in adult patient     Plan:   #1: Corticosteroid injection to the right hip will be scheduled at [place]. #2: If this fails we told him that he could consider total hip replacement.  Follow-Up Instructions: Return if symptoms worsen or fail to improve.   Orders:  Orders Placed This Encounter  Procedures  . XR HIP UNILAT W OR W/O PELVIS 2-3 VIEWS RIGHT  . XR Lumbar Spine 2-3 Views   No orders of the defined types were placed in this encounter.     Procedures: No procedures performed   Clinical Data: No additional findings.   Subjective: Chief Complaint  Patient presents with  . Right Hip - Pain  . Hip Pain    Right hip pain x 3 weeks, fell in May, 2019 - keys in pocket when fell - where the pain is located now, no surgery to hip, not diabetic, Oxycodone doesn't help, difficulty walking, moving, leg raises, getting in/out of car, difficulty sleeping at night    HPI  Luis Guzman is a very pleasant 72 year old white male who is seen today for evaluation of his right hip and hemipelvis pain.  He states that approximately 3 weeks ago he began having pain in his right hip area.  This is more along the anterior lateral aspect of the hip.  He does have a history of a fall in May 2019 and he had keys in his pocket when he fell and that is where his pain is now.  He apparently went on a trip to [place] and actually had to limit his activities off of the boat because of his pain and discomfort.  There was an acupuncturist on the  boat and apparently did some treatment which only lasted about 2 hours.  He really does not give much in the way of any history of radicular type symptoms.  He is pointing mainly at the anterior lateral aspect of the right hip.  He has been oozing oxycodone but this is not beneficial.  He has difficulty with ambulation.  He does have pain with any movement of the hip itself.  Difficulty with getting in and out of a car.  He is having having nighttime pain.  Review of Systems  Constitutional: Positive for activity change.  HENT: Negative for trouble swallowing.   Eyes: Negative for pain.  Respiratory: Negative for shortness of breath.   Cardiovascular: Negative for leg swelling.  Gastrointestinal: Negative for constipation.  Endocrine: Positive for heat intolerance.  Genitourinary: Negative for difficulty urinating.  Musculoskeletal: Negative for joint swelling.  Skin: Negative for rash.  Allergic/Immunologic: Negative for food allergies.  Neurological: Negative for numbness.  Hematological: Bruises/bleeds easily.  Psychiatric/Behavioral: Positive for sleep disturbance.     Objective: Vital Signs: BP (!) 146/74 (BP Location: Left Arm, Patient Position: Sitting, Cuff Size: Normal)   Pulse 78   Resp 16  Ht 6' (1.829 m)   Wt 181 lb 6.4 oz (82.3 kg)   BMI 24.60 kg/m   Physical Exam  Constitutional: He is oriented to person, place, and time. He appears well-developed and well-nourished.  HENT:  Mouth/Throat: Oropharynx is clear and moist.  Eyes: Pupils are equal, round, and reactive to light. EOM are normal.  Pulmonary/Chest: Effort normal.  Neurological: He is alert and oriented to person, place, and time.  Skin: Skin is warm and dry.  Psychiatric: He has a normal mood and affect. His behavior is normal.    Ortho Exam Exam today reveals pain with internal rotation of the right hip.  He only has about 10 to 15 degrees of internal rotation external rotation about 30 degrees.   Straight leg raise is negative.   Specialty Comments:  No specialty comments available.  Imaging: Xr Hip Unilat W Or W/o Pelvis 2-3 Views Right  Result Date: 04/26/2018 2 view x-ray of the pelvis and right hip reveals degenerative changes with mild joint space narrowing by about 30 to 40% comparison to x-rays reviewed from 9 years ago.  There is also more cystic changes in the both the acetabulum and the femoral head.  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  Does have some inferior spurring femoral head.  No change the SI joint more on the right than on the left.  Xr Lumbar Spine 2-3 Views  Result Date: 04/26/2018 Three-view x-ray lumbar spine reveals degenerative scoliosis noted.  Multilevel disc disease is noted.  Does have some changes in the SI joint also bilaterally.  Significant anterior spurring L3 445 and 5 S1.  Certainly he does have facet degenerative changes noted also in that area with advanced degenerative changes.    PMFS History: Current Outpatient Medications  Medication Sig Dispense Refill  . diclofenac sodium (VOLTAREN) 1 % GEL Apply 2 g topically 4 (four) times daily. (Patient taking differently: Apply 2 g topically 4 (four) times daily as needed. ) 3 Tube 1  . hydrochlorothiazide (MICROZIDE) 12.5 MG capsule Take 1 capsule (12.5 mg total) by mouth daily. 90 capsule 3  . losartan (COZAAR) 100 MG tablet Take 1 tablet (100 mg total) by mouth daily. 90 tablet 3  . metoprolol succinate (TOPROL-XL) 50 MG 24 hr tablet TAKE 1 TABLET BY MOUTH  DAILY WITH OR IMMEDIATLEY  FOLLOWING A MEAL 90 tablet 3  . oxyCODONE-acetaminophen (PERCOCET) 10-325 MG per tablet Take 1 tablet by mouth every 6 (six) hours as needed for pain.    . traMADol (ULTRAM) 50 MG tablet 1-2 tablets po bid prn 30 tablet 0  . tiZANidine (ZANAFLEX) 4 MG tablet TAKE 1 TABLET BY MOUTH EVERY 8 HOURS AS NEEDED NO MORE THAN 3 DOSES IN 24 HOURS  2   No current facility-administered medications for  this visit.     Patient Active Problem List   Diagnosis Date Noted  . AAA (abdominal aortic aneurysm) without rupture ([place]) 04/15/2016  . Biliary dyskinesia 04/08/2014  . Abdominal pain, epigastric 04/08/2014  . Pancreatitis, acute 03/12/2014  . Pancreatitis 03/12/2014  . Type 2 diabetes mellitus ([place]) 06/25/2012  . ACUTE BRONCHITIS 03/11/2010  . 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 SINUSITIS, ACUTE 06/11/2009  . Hyperlipidemia 01/26/2009  . Essential hypertension 01/26/2009   Past Medical History:  Diagnosis Date  . GERD (gastroesophageal reflux disease)   . HYPERLIPIDEMIA 01/26/2009  . HYPERTENSION 01/26/2009  . 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 lower  back pain, s/p cervical neck surgery"stiffness" remains  . Transfusion history    infant "anemia"    Family History  Problem Relation Age of Onset  . Hypertension Mother   . Hypertension Father   . Arthritis Sister        rheumatoid  . Stroke Sister 19    Past Surgical History:  Procedure Laterality Date  . APPENDECTOMY  ~ 1959  . BACK SURGERY    . CERVICAL LAMINECTOMY  1997   C 4 and C 5  . CHOLECYSTECTOMY N/A 04/15/2014   Procedure: LAPAROSCOPIC CHOLECYSTECTOMY WITH INTRAOPERATIVE CHOLANGIOGRAM;  Surgeon: Armandina Gemma, MD;  Location: [facility];  Service: General;  Laterality: N/A;  . ESOPHAGOGASTRODUODENOSCOPY N/A 03/13/2014   Procedure: ESOPHAGOGASTRODUODENOSCOPY (EGD);  Surgeon: Wonda Horner, MD;  Location: [facility] ENDOSCOPY;  Service: Endoscopy;  Laterality: N/A;  . 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  . SHOULDER ARTHROSCOPY    . SHOULDER ARTHROSCOPY W/ ROTATOR CUFF REPAIR Bilateral 2004-2009   left 2004, right 2009  . VASECTOMY     Social History   Occupational History  . Not on file  Tobacco Use  . Smoking status: Former Smoker    Packs/day: 1.00    Years: 5.00    Pack years: 5.00    Types: Cigarettes    Last attempt to quit: 10/17/1968     Years since quitting: 49.5  . Smokeless tobacco: Never Used  Substance and Sexual Activity  . Alcohol use: Not Currently    Comment: "quit drinking ~ 10/1968  . Drug use: No  . Sexual activity: Not Currently

## 2018-04-30 ENCOUNTER — Ambulatory Visit
Admission: RE | Admit: 2018-04-30 | Discharge: 2018-04-30 | Disposition: A | Discharge status: Home / Self Care | Payer: Medicare Other | Source: Ambulatory Visit | Attending: Orthopaedic Surgery | Admitting: Orthopaedic Surgery

## 2018-04-30 DIAGNOSIS — M25551 Pain in right hip: Secondary | ICD-10-CM | POA: Diagnosis not present

## 2018-04-30 DIAGNOSIS — M4302 Spondylolysis, cervical region: Secondary | ICD-10-CM | POA: Diagnosis not present

## 2018-04-30 DIAGNOSIS — M5412 Radiculopathy, cervical region: Secondary | ICD-10-CM | POA: Diagnosis not present

## 2018-04-30 MED ORDER — METHYLPREDNISOLONE ACETATE 40 MG/ML INJ SUSP (RADIOLOG
120.0000 mg | Freq: Once | INTRAMUSCULAR | Status: AC
  Administered 2018-04-30: 120 mg via INTRA_ARTICULAR

## 2018-04-30 MED ORDER — IOPAMIDOL (ISOVUE-M 200) INJECTION 41%
1.0000 mL | Freq: Once | INTRAMUSCULAR | Status: AC
  Administered 2018-04-30: 1 mL via INTRA_ARTICULAR

## 2018-05-01 NOTE — Progress Notes (Addendum)
Subjective:   Luis Guzman is a 72 y.o. male who presents for Medicare Annual/Subsequent preventive examination.  Reports health as Last OV 04/2017 and today   Diet [facility]  Challenged to a weight loss program Stop drinking sweet tea;  Bought of piliates machine Chol/hdl 7; chol 204; hdl 30; trig 203  A1c 6.2 Pre-diabetes Doctor is checking all the labs   Exercise Yard work  Travel a lot ( still working) May be retiring soon due to OA   2 shoulder surgeries  Dr. Tanda Rockers injection hip C4,5,6 with probably disc replacement   Health Maintenance Due  Topic Date Due  . FOOT EXAM  03/25/1956  . HEMOGLOBIN A1C  10/16/2017  . INFLUENZA VACCINE  03/15/2018    Cardiac Risk Factors include: advanced age (>74men, >89 women);family history of premature cardiovascular disease;dyslipidemia;hypertension;male gender   PSA 02/2017  Foot care completed  Eye exam 2 to 3 weeks ago [facility] and also [facility] at the [place]; [facility] but [facility] as yet  [facility]  Dr. Elease Hashimoto noted AAA 2.7 cm 04/15/2016  for 5 year fup    colonoscopy 01/2016 due in 10 years  Bowel issues and Dr. Kalman Shan is following  Objective:    Vitals: BP 138/82   Ht 6' (1.829 m)   Wt 184 lb (83.5 kg)   BMI 24.95 kg/m   Body mass index is 24.95 kg/m.  Advanced Directives 05/02/2018 04/15/2014 04/15/2014 04/10/2014 04/03/2014 03/12/2014  Does Patient Have a Medical Advance Directive? Yes Yes - Yes Yes Patient has advance directive, copy not in chart  Type of Advance Directive - [place];Living will - [place];Living will [place];Living will Living will  Does patient want to make changes to medical advance directive? - No - Patient declined No - Patient declined No - Patient declined - No change requested  Copy of [place] in Chart? - No - copy requested - No - copy requested - Copy requested from  other (Comment)  Pre-existing out of facility DNR order (yellow form or pink MOST form) - - - - - No    Tobacco Social History   Tobacco Use  Smoking Status Former Smoker  . Packs/day: 1.00  . Years: 5.00  . Pack years: 5.00  . Types: Cigarettes  . Last attempt to quit: 10/17/1968  . Years since quitting: 49.5  Smokeless Tobacco Never Used     Counseling given: Yes   Clinical Intake:  Past Medical History:  Diagnosis Date  . GERD (gastroesophageal reflux disease)   . HYPERLIPIDEMIA 01/26/2009  . HYPERTENSION 01/26/2009  . 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 lower back pain, s/p cervical neck surgery"stiffness" remains  . Transfusion history    infant "anemia"   Past Surgical History:  Procedure Laterality Date  . APPENDECTOMY  ~ 1959  . BACK SURGERY    . CERVICAL LAMINECTOMY  1997   C 4 and C 5  . CHOLECYSTECTOMY N/A 04/15/2014   Procedure: LAPAROSCOPIC CHOLECYSTECTOMY WITH INTRAOPERATIVE CHOLANGIOGRAM;  Surgeon: Armandina Gemma, MD;  Location: [facility];  Service: General;  Laterality: N/A;  . ESOPHAGOGASTRODUODENOSCOPY N/A 03/13/2014   Procedure: ESOPHAGOGASTRODUODENOSCOPY (EGD);  Surgeon: Wonda Horner, MD;  Location: [facility] ENDOSCOPY;  Service: Endoscopy;  Laterality: N/A;  . 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  . SHOULDER ARTHROSCOPY    . SHOULDER ARTHROSCOPY W/ ROTATOR CUFF REPAIR  Bilateral 2004-2009   left 2004, right 2009  . VASECTOMY     Family History  Problem Relation Age of Onset  . Hypertension Mother   . Hypertension Father   . Arthritis Sister        rheumatoid  . Stroke Sister 66   Social History   Socioeconomic History  . Marital status: Married    Spouse name: Not on file  . Number of children: Not on file  . Years of education: Not on file  . Highest education level: Not on file  Occupational History  . Not on file  Social Needs  . Financial resource strain: Not on file  . Food insecurity:    Worry:  Not on file    Inability: Not on file  . Transportation needs:    Medical: Not on file    Non-medical: Not on file  Tobacco Use  . Smoking status: Former Smoker    Packs/day: 1.00    Years: 5.00    Pack years: 5.00    Types: Cigarettes    Last attempt to quit: 10/17/1968    Years since quitting: 49.5  . Smokeless tobacco: Never Used  Substance and Sexual Activity  . Alcohol use: Not Currently    Comment: "quit drinking ~ 10/1968  . Drug use: No  . Sexual activity: Not Currently  Lifestyle  . Physical activity:    Days per week: Not on file    Minutes per session: Not on file  . Stress: Not on file  Relationships  . Social connections:    Talks on phone: Not on file    Gets together: Not on file    Attends religious service: Not on file    Active member of club or organization: Not on file    Attends meetings of clubs or organizations: Not on file    Relationship status: Not on file  Other Topics Concern  . Not on file  Social History Narrative  . Not on file    Outpatient Encounter Medications as of 05/02/2018  Medication Sig  . amLODipine (NORVASC) 2.5 MG tablet Take 1 tablet (2.5 mg total) by mouth daily.  . diclofenac sodium (VOLTAREN) 1 % GEL Apply 2 g topically 4 (four) times daily. (Patient taking differently: Apply 2 g topically 4 (four) times daily as needed. )  . hydrochlorothiazide (MICROZIDE) 12.5 MG capsule Take 1 capsule (12.5 mg total) by mouth daily.  [name] losartan (COZAAR) 100 MG tablet Take 1 tablet (100 mg total) by mouth daily.  . metoprolol succinate (TOPROL-XL) 50 MG 24 hr tablet TAKE 1 TABLET BY MOUTH  DAILY WITH OR IMMEDIATLEY  FOLLOWING A MEAL  . oxyCODONE-acetaminophen (PERCOCET) 10-325 MG per tablet Take 1 tablet by mouth every 6 (six) hours as needed for pain.  . traMADol (ULTRAM) 50 MG tablet 1-2 tablets po bid prn  . [DISCONTINUED] tiZANidine (ZANAFLEX) 4 MG tablet TAKE 1 TABLET BY MOUTH EVERY 8 HOURS AS NEEDED NO MORE THAN 3 DOSES IN 24 HOURS    No facility-administered encounter medications on file as of 05/02/2018.     Activities of Daily Living In your present state of health, do you have any difficulty performing the following activities: 05/02/2018  Hearing? N  Vision? N  Difficulty concentrating or making decisions? N  Walking or climbing stairs? N  Dressing or bathing? N  Doing errands, shopping? N  Preparing Food and eating ? N  Using the Toilet? N  In the past six months,  have you accidently leaked urine? N  Do you have problems with loss of bowel control? N  Managing your Medications? N  Managing your Finances? N  Housekeeping or managing your Housekeeping? N  Some recent data might be hidden    Patient Care Team: Eulas Post, MD as PCP - Lisa Roca, MD as Consulting Physician (Neurosurgery) Garald Balding, MD as Consulting Physician (Orthopedic Surgery)   Assessment:   This is a routine wellness examination for Con.  Exercise Activities and Dietary recommendations Current Exercise Habits: Home exercise routine, Time (Minutes): 60, Frequency (Times/Week): 3, Weekly Exercise (Minutes/Week): 180, Intensity: Moderate  Goals    . Patient Stated     Quit work; develop a retirement plan        Fall Risk Fall Risk  05/02/2018 05/02/2018 04/18/2017 04/15/2016 04/16/2015  Falls in the past year? No No No No No     Depression Screen PHQ 2/9 Scores 05/02/2018 05/02/2018 04/18/2017 04/15/2016  PHQ - 2 Score 0 0 0 0    Cognitive Function MMSE - Mini Mental State Exam 05/02/2018  Not completed: (No Data)   Ad8 score reviewed for issues:  Issues making decisions:  Less interest in hobbies / activities:  Repeats questions, stories (family complaining):  Trouble using ordinary gadgets (microwave, computer, phone):  Forgets the month or year:   Mismanaging finances:   Remembering appts:  Daily problems with thinking and/or memory: Ad8 score is=0 No issues with memory noted, still  actively working and travels some as well         Immunization History  Administered Date(s) Administered  . Influenza Split 05/15/2012  . Influenza Whole 06/15/2010  . Influenza, High Dose Seasonal PF 04/15/2016, 04/18/2017  . Influenza,inj,Quad PF,6+ Mos 08/02/2013, 05/05/2014  . Influenza-Unspecified 07/30/2015  . Pneumococcal Conjugate-13 09/27/2013  . Pneumococcal Polysaccharide-23 09/15/2008, 04/16/2015  . Td 08/16/2003  . Tdap 05/05/2014  . Zoster 06/17/2011      Screening Tests Health Maintenance  Topic Date Due  . FOOT EXAM  03/[birth year]/1956  . HEMOGLOBIN A1C  10/16/2017  . INFLUENZA VACCINE  03/15/2018  . OPHTHALMOLOGY EXAM  03/30/2019  . TETANUS/TDAP  05/05/2024  . COLONOSCOPY  02/14/2026  . Hepatitis C Screening  Completed  . PNA vac Low Risk Adult  Completed         Plan:      PCP Notes   Health Maintenance PSA 02/2017  Foot care completed   Eye exam 2 to 3 weeks ago [facility] and also checked at the [place]; [facility] but not dx as yet  [facility]  Dr. Elease Hashimoto noted AAA 2.7 cm 04/15/2016  for 5 year fup    colonoscopy 01/2016 due in 10 years  Bowel issues and Dr. Kalman Shan is following    Abnormal Screens  A1c elevated in the past but has lost 35 lbs and will recheck this am   Referrals  none  Patient concerns; none  Nurse Concerns; As noted  Next PCP apt Today and will schedule in 30 days and annual next year with [facility]  Has to check his calendar because he is still working      I have personally reviewed and noted the following in the patient's chart:   . Medical and social history . Use of alcohol, tobacco or illicit drugs  . Current medications and supplements . Functional ability and status . Nutritional status . Physical activity . Advanced directives . List  of other physicians . Hospitalizations, surgeries, and ER visits in previous 12 months . Vitals . Screenings to  include cognitive, depression, and falls . Referrals and appointments  In addition, I have reviewed and discussed with patient certain preventive protocols, quality metrics, and best practice recommendations. A written personalized care plan for preventive services as well as general preventive health recommendations were provided to patient.     HMCNO,BSJGG, RN  05/02/2018  I have reviewed the documentation for the [facility] and [place] provided by the health coach and agree with their documentation. I was immediately available for any questions  Eulas Post MD [facility]

## 2018-05-02 ENCOUNTER — Ambulatory Visit (INDEPENDENT_AMBULATORY_CARE_PROVIDER_SITE_OTHER): Payer: Medicare Other | Admitting: Family Medicine

## 2018-05-02 ENCOUNTER — Encounter: Payer: Self-pay | Admitting: Family Medicine

## 2018-05-02 ENCOUNTER — Telehealth: Payer: Self-pay | Admitting: Family Medicine

## 2018-05-02 ENCOUNTER — Ambulatory Visit (INDEPENDENT_AMBULATORY_CARE_PROVIDER_SITE_OTHER): Payer: Medicare Other

## 2018-05-02 ENCOUNTER — Other Ambulatory Visit: Payer: Self-pay

## 2018-05-02 VITALS — BP 138/82 | Ht 72.0 in | Wt 184.0 lb

## 2018-05-02 VITALS — BP 138/82 | HR 54 | Temp 98.3°F | Resp 15 | Ht 72.0 in | Wt 184.6 lb

## 2018-05-02 DIAGNOSIS — E785 Hyperlipidemia, unspecified: Secondary | ICD-10-CM

## 2018-05-02 DIAGNOSIS — Z0000 Encounter for general adult medical examination without abnormal findings: Secondary | ICD-10-CM

## 2018-05-02 DIAGNOSIS — Z125 Encounter for screening for malignant neoplasm of prostate: Secondary | ICD-10-CM

## 2018-05-02 DIAGNOSIS — Z23 Encounter for immunization: Secondary | ICD-10-CM

## 2018-05-02 DIAGNOSIS — R739 Hyperglycemia, unspecified: Secondary | ICD-10-CM

## 2018-05-02 DIAGNOSIS — I10 Essential (primary) hypertension: Secondary | ICD-10-CM | POA: Diagnosis not present

## 2018-05-02 LAB — BASIC METABOLIC PANEL
BUN: 18 mg/dL (ref 6–23)
CO2: 33 mEq/L — ABNORMAL HIGH (ref 19–32)
Calcium: 9.7 mg/dL (ref 8.4–10.5)
Chloride: 102 mEq/L (ref 96–112)
Creatinine, Ser: 0.83 mg/dL (ref 0.40–1.50)
GFR: 96.77 mL/min (ref >60.00)
Glucose, Bld: 101 mg/dL — ABNORMAL HIGH (ref 70–99)
Potassium: 3.8 mEq/L (ref 3.5–5.1)
Sodium: 142 mEq/L (ref 135–145)

## 2018-05-02 LAB — HEPATIC FUNCTION PANEL
ALT: 15 U/L (ref 0–53)
AST: 17 U/L (ref 0–37)
Albumin: 4.4 g/dL (ref 3.5–5.2)
Alkaline Phosphatase: 96 U/L (ref 39–117)
Bilirubin, Direct: 0.1 mg/dL (ref 0.0–0.3)
Total Bilirubin: 0.7 mg/dL (ref 0.2–1.2)
Total Protein: 6.7 g/dL (ref 6.0–8.3)

## 2018-05-02 LAB — LIPID PANEL
Cholesterol: 239 mg/dL — ABNORMAL HIGH (ref 0–200)
HDL: 42.8 mg/dL (ref >39.00)
LDL Cholesterol: 176 mg/dL — ABNORMAL HIGH (ref 0–99)
NonHDL: 196.65
Total CHOL/HDL Ratio: 6
Triglycerides: 103 mg/dL (ref 0.0–149.0)
VLDL: 20.6 mg/dL (ref 0.0–40.0)

## 2018-05-02 LAB — HEMOGLOBIN A1C: Hgb A1c MFr Bld: 6 % (ref 4.6–6.5)

## 2018-05-02 LAB — PSA, MEDICARE: PSA: 3.13 ng/ml (ref 0.10–4.00)

## 2018-05-02 MED ORDER — AMLODIPINE BESYLATE 2.5 MG PO TABS
2.5000 mg | ORAL_TABLET | Freq: Every day | ORAL | 3 refills | Status: DC
Start: 2018-05-02 — End: 2019-02-27

## 2018-05-02 NOTE — Telephone Encounter (Signed)
hydrochlorothiazide refill Last Refill:04/18/18 # 90 Last OV: 05/02/18 PCP: Dr Elease Hashimoto Pharmacy: [facility]  metoprolol refill Last Refill:04/18/18 # 90 Last OV: 05/02/18 PCP: Dr Elease Hashimoto Pharmacy: [facility]  losartan refill Last Refill:04/18/18 # 90 Last OV: 05/02/18 PCP: Dr Elease Hashimoto Pharmacy: [facility]

## 2018-05-02 NOTE — Telephone Encounter (Signed)
Patient seen today.  Please advise if okay to refill.

## 2018-05-02 NOTE — Progress Notes (Addendum)
Subjective:     Patient ID: Luis Guzman, male   DOB: [birth year], 72 y.o.   MRN: [id number]  HPI Patient is here for medical follow-up. He is also here for Medicare wellness visit with our health coach. He has history of hyperlipidemia, hypertension, hyperglycemia, past history of acute pancreatitis, osteoarthritis involving multiple joints. He's had some ongoing problems with arthritis in his neck and is looking at getting cervical disc replacement at 2 levels in December. 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 Also had have rotator cuff repair redo surgery this past year.  His problem list states abdominal aortic aneurysm but looking back 2017 he had mention of ectatic aorta at risk for aneurysm with recommended five-year follow-up  Medications reviewed. Compliant with all. His wife is retired Marine scientist and has been checking blood pressure regularly and they're getting consistent readings at home 706 to 237 systolic and around 90 diastolic. No headaches or chest pain.  Past history of mild elevated PSA. Occasional nocturia. No slow stream.  The 10-year ASCVD risk score Mikey Bussing DC Jr., et al., 2013) is: 50%   Values used to calculate the score:     Age: 72 years     Sex: Male     Is Non-Hispanic African American: No     Diabetic: Yes     Tobacco smoker: No     Systolic Blood Pressure: 628 mmHg     Is BP treated: Yes     HDL Cholesterol: 42.8 mg/dL     Total Cholesterol: 239 mg/dL   Past Medical History:  Diagnosis Date  . GERD (gastroesophageal reflux disease)   . HYPERLIPIDEMIA 01/26/2009  . HYPERTENSION 01/26/2009  . 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 lower back pain, s/p cervical neck surgery"stiffness" remains  . Transfusion history    infant "anemia"   Past Surgical History:  Procedure Laterality Date  . APPENDECTOMY  ~ 1959  . BACK SURGERY    . CERVICAL LAMINECTOMY  1997   C 4 and C 5  . CHOLECYSTECTOMY  N/A 04/15/2014   Procedure: LAPAROSCOPIC CHOLECYSTECTOMY WITH INTRAOPERATIVE CHOLANGIOGRAM;  Surgeon: Armandina Gemma, MD;  Location: [facility];  Service: General;  Laterality: N/A;  . ESOPHAGOGASTRODUODENOSCOPY N/A 03/13/2014   Procedure: ESOPHAGOGASTRODUODENOSCOPY (EGD);  Surgeon: Wonda Horner, MD;  Location: [facility];  Service: Endoscopy;  Laterality: N/A;  . 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  . SHOULDER ARTHROSCOPY    . SHOULDER ARTHROSCOPY W/ ROTATOR CUFF REPAIR Bilateral 2004-2009   left 2004, right 2009  . VASECTOMY      reports that he quit smoking about 49 years ago. His smoking use included cigarettes. He has a 5.00 pack-year smoking history. He has never used smokeless tobacco. He reports that he drank alcohol. He reports that he does not use drugs. family history includes Arthritis in his sister; Hypertension in his father and mother; Stroke (age of onset: 49) in his sister. Allergies  Allergen Reactions  . Cefdinir Other (See Comments)  . Penicillins Itching     Review of Systems  Constitutional: Negative for fatigue.  Eyes: Negative for visual disturbance.  Respiratory: Negative for cough, chest tightness and shortness of breath.   Cardiovascular: Negative for chest pain, palpitations and leg swelling.  Endocrine: Negative for polydipsia and polyuria.  Musculoskeletal: Positive for arthralgias, neck pain and neck stiffness.  Neurological: Negative for dizziness, syncope, weakness, light-headedness and headaches.  Objective:   Physical Exam  Constitutional: He is oriented to person, place, and time. He appears well-developed and well-nourished. No distress.  HENT:  Head: Normocephalic and atraumatic.  Right Ear: External ear normal.  Left Ear: External ear normal.  Mouth/Throat: Oropharynx is clear and moist.  Eyes: Pupils are equal, round, and reactive to light. Conjunctivae and EOM are normal.  Neck: Normal range of motion. Neck supple. No  thyromegaly present.  Cardiovascular: Normal rate, regular rhythm and normal heart sounds.  No murmur heard. Pulmonary/Chest: No respiratory distress. He has no wheezes. He has no rales.  Abdominal: Soft. Bowel sounds are normal. He exhibits no distension and no mass. There is no tenderness. There is no rebound and no guarding.  Genitourinary:  Genitourinary Comments: Prostate is slightly enlarged diffusely. No nodules palpated. No rectal mass palpated.  Musculoskeletal: He exhibits no edema.  Lymphadenopathy:    He has no cervical adenopathy.  Neurological: He is alert and oriented to person, place, and time. He displays normal reflexes. No cranial nerve deficit.  Skin: No rash noted.  Psychiatric: He has a normal mood and affect.       Assessment:     #1 hypertension poorly controlled several home readings. Repeat reading today after rest 140/80.  #2 hyperlipidemia  #3 history of mildly elevated PSA  #4 osteoarthritis involving multiple joints  #5 history of hyperglycemia. He has lost substantial weight due to his efforts in recent years and blood sugars have improved    Plan:     -check further labs with basic metabolic panel, lipid panel, hepatic panel, A1c, PSA -Flu vaccine will be given -Consider repeat abdominal aortic aneurysm screening 2022 -He'll discuss newer shingles vaccine with our health coach  Eulas Post MD [facility] at [facility]

## 2018-05-02 NOTE — Telephone Encounter (Signed)
Refill for one year 

## 2018-05-02 NOTE — Telephone Encounter (Signed)
Copied from [place] [id number]. Topic: Quick Communication - See Telephone Encounter >> May 02, 2018  9:11 AM [name] wrote: CRM for notification. See Telephone encounter for: 05/02/18. Patient would like the following medications refilled and sent to his preferred pharmacy [facility]: 1) hydrochlorothiazide (MICROZIDE) 12.5 MG capsule  2) metoprolol succinate (TOPROL-XL) 50 MG 24 hr tablet 3) losartan (COZAAR) 100 MG tablet

## 2018-05-02 NOTE — Patient Instructions (Addendum)
Luis Guzman , Thank you for taking time to come for your Medicare Wellness Visit. I appreciate your ongoing commitment to your health goals. Please review the following plan we discussed and let me know if I can assist you in the future.   You had your high dose flu vaccine today  Shingrix is a vaccine for the prevention of Shingles in Adults 50 and older.  If you are on Medicare, the shingrix is covered under your Part D plan, so you will take both of the vaccines in the series at your pharmacy. Please check with your benefits regarding applicable copays or out of pocket expenses.  The Shingrix is given in 2 vaccines approx 8 weeks apart. You must receive the 2nd dose prior to 6 months from receipt of the first. Please have the pharmacist print out you Immunization  dates for our office records   These are the goals we discussed: Goals    . Patient Stated     Quit work; develop a retirement plan        This is a Building surveyor of the screening recommended for you and due dates:  Health Maintenance  Topic Date Due  . Complete foot exam   03/25/1956  . Hemoglobin A1C  10/16/2017  . Flu Shot  03/15/2018  . Eye exam for diabetics  03/30/2019  . Tetanus Vaccine  05/05/2024  . Colon Cancer Screening  02/14/2026  .  Hepatitis C: One time screening is recommended by Center for Disease Control  (CDC) for  adults born from [birth year] through 1965.   Completed  . Pneumonia vaccines  Completed   Prevention of falls: Remove rugs or any tripping hazards in the home Use Non slip mats in bathtubs and showers Placing grab bars next to the toilet and or shower Placing handrails on both sides of the stair way Adding extra lighting in the home.   Personal safety issues reviewed:  1. Consider starting a community watch program per [facility] 2.  Changes batteries is smoke detector and/or carbon monoxide detector  3.  If you have firearms; keep them in a safe place 4.  Wear protection when in the sun;  Always wear sunscreen or a hat; It is good to have your doctor check your skin annually or review any new areas of concern 5. Driving safety; Keep in the right lane; stay 3 car lengths behind the car in front of you on the highway; look 3 times prior to pulling out; carry your cell phone everywhere you go!    Learn about the Yellow Dot program:  The program allows first responders at your emergency to have access to who your physician is, as well as your medications and medical conditions.  Citizens requesting the Yellow Dot Packages should contact Master Corporal Nunzio Cobbs at the [facility] [phone] for the first week of the program and beginning the week after Easter citizens should contact their Scientist, physiological.    Health Maintenance, Male A healthy lifestyle and preventive care is important for your health and wellness. Ask your health care provider about what schedule of regular examinations is right for you. What should I know about weight and diet? Eat a Healthy Diet  Eat plenty of vegetables, fruits, whole grains, low-fat dairy products, and lean protein.  Do not eat a lot of foods high in solid fats, added sugars, or salt.  Maintain a Healthy Weight Regular exercise can help you achieve or maintain  a healthy weight. You should:  Do at least 150 minutes of exercise each week. The exercise should increase your heart rate and make you sweat (moderate-intensity exercise).  Do strength-training exercises at least twice a week.  Watch Your Levels of Cholesterol and Blood Lipids  Have your blood tested for lipids and cholesterol every 5 years starting at 72 years of age. If you are at high risk for heart disease, you should start having your blood tested when you are 72 years old. You may need to have your cholesterol levels checked more often if: ? Your lipid or cholesterol levels are high. ? You are older than 72 years of age. ? You are  at high risk for heart disease.  What should I know about cancer screening? Many types of cancers can be detected early and may often be prevented. Lung Cancer  You should be screened every year for lung cancer if: ? You are a current smoker who has smoked for at least 30 years. ? You are a former smoker who has quit within the past 15 years.  Talk to your health care provider about your screening options, when you should start screening, and how often you should be screened.  Colorectal Cancer  Routine colorectal cancer screening usually begins at 72 years of age and should be repeated every 5-10 years until you are 72 years old. You may need to be screened more often if early forms of precancerous polyps or small growths are found. Your health care provider may recommend screening at an earlier age if you have risk factors for colon cancer.  Your health care provider may recommend using home test kits to check for hidden blood in the stool.  A small camera at the end of a tube can be used to examine your colon (sigmoidoscopy or colonoscopy). This checks for the earliest forms of colorectal cancer.  Prostate and Testicular Cancer  Depending on your age and overall health, your health care provider may do certain tests to screen for prostate and testicular cancer.  Talk to your health care provider about any symptoms or concerns you have about testicular or prostate cancer.  Skin Cancer  Check your skin from head to toe regularly.  Tell your health care provider about any new moles or changes in moles, especially if: ? There is a change in a mole's size, shape, or color. ? You have a mole that is larger than a pencil eraser.  Always use sunscreen. Apply sunscreen liberally and repeat throughout the day.  Protect yourself by wearing long sleeves, pants, a wide-brimmed hat, and sunglasses when outside.  What should I know about heart disease, diabetes, and high blood  pressure?  If you are 33-49 years of age, have your blood pressure checked every 3-5 years. If you are 22 years of age or older, have your blood pressure checked every year. You should have your blood pressure measured twice-once when you are at a hospital or clinic, and once when you are not at a hospital or clinic. Record the average of the two measurements. To check your blood pressure when you are not at a hospital or clinic, you can use: ? An automated blood pressure machine at a pharmacy. ? A home blood pressure monitor.  Talk to your health care provider about your target blood pressure.  If you are between 14-38 years old, ask your health care provider if you should take aspirin to prevent heart disease.  Have regular diabetes  screenings by checking your fasting blood sugar level. ? If you are at a normal weight and have a low risk for diabetes, have this test once every three years after the age of 58. ? If you are overweight and have a high risk for diabetes, consider being tested at a younger age or more often.  A one-time screening for abdominal aortic aneurysm (AAA) by ultrasound is recommended for men aged 89-75 years who are current or former smokers. What should I know about preventing infection? Hepatitis B If you have a higher risk for hepatitis B, you should be screened for this virus. Talk with your health care provider to find out if you are at risk for hepatitis B infection. Hepatitis C Blood testing is recommended for:  Everyone born from [birth year] through 1965.  Anyone with known risk factors for hepatitis C.  Sexually Transmitted Diseases (STDs)  You should be screened each year for STDs including gonorrhea and chlamydia if: ? You are sexually active and are younger than 72 years of age. ? You are older than 72 years of age and your health care provider tells you that you are at risk for this type of infection. ? Your sexual activity has changed since you were last  screened and you are at an increased risk for chlamydia or gonorrhea. Ask your health care provider if you are at risk.  Talk with your health care provider about whether you are at high risk of being infected with HIV. Your health care provider may recommend a prescription medicine to help prevent HIV infection.  What else can I do?  Schedule regular health, dental, and eye exams.  Stay current with your vaccines (immunizations).  Do not use any tobacco products, such as cigarettes, chewing tobacco, and e-cigarettes. If you need help quitting, ask your health care provider.  Limit alcohol intake to no more than 2 drinks per day. One drink equals 12 ounces of beer, 5 ounces of wine, or 1 ounces of hard liquor.  Do not use street drugs.  Do not share needles.  Ask your health care provider for help if you need support or information about quitting drugs.  Tell your health care provider if you often feel depressed.  Tell your health care provider if you have ever been abused or do not feel safe at home. This information is not intended to replace advice given to you by your health care provider. Make sure you discuss any questions you have with your health care provider. Document Released: 01/28/2008 Document Revised: 03/30/2016 Document Reviewed: 05/05/2015 Elsevier Interactive Patient Education  [employer].

## 2018-05-04 ENCOUNTER — Other Ambulatory Visit: Payer: Self-pay

## 2018-05-04 MED ORDER — HYDROCHLOROTHIAZIDE 12.5 MG PO CAPS: 12.5000 mg | ORAL_CAPSULE | Freq: Every day | ORAL | 3 refills | Status: DC

## 2018-05-04 MED ORDER — METOPROLOL SUCCINATE ER 50 MG PO TB24: ORAL_TABLET | ORAL | 3 refills | Status: DC

## 2018-05-04 MED ORDER — LOSARTAN POTASSIUM 100 MG PO TABS: 100.0000 mg | ORAL_TABLET | Freq: Every day | ORAL | 3 refills | Status: DC

## 2018-05-04 NOTE — Telephone Encounter (Signed)
These prescriptions have been sent to [facility].

## 2018-05-11 ENCOUNTER — Other Ambulatory Visit: Payer: Medicare Other

## 2018-05-11 ENCOUNTER — Ambulatory Visit (INDEPENDENT_AMBULATORY_CARE_PROVIDER_SITE_OTHER): Payer: Medicare Other | Admitting: Orthopaedic Surgery

## 2018-06-04 ENCOUNTER — Telehealth (INDEPENDENT_AMBULATORY_CARE_PROVIDER_SITE_OTHER): Payer: Self-pay | Admitting: Orthopaedic Surgery

## 2018-06-04 NOTE — Telephone Encounter (Signed)
Patient called stating he had a right hip injection on 04/30/18 at [facility].  Patient states it gave him some pain relief for about 2 weeks, but then the pain returned.  Patient is asking how long before he can schedule another injection, or if there is something else Dr. Durward Fortes would recommend.

## 2018-06-05 NOTE — Telephone Encounter (Signed)
Please advise 

## 2018-06-05 NOTE — Telephone Encounter (Signed)
Need to wait at least 4-6 week.  May need to check with Neurosurgery about getting injectio prior to their surgery

## 2018-06-06 ENCOUNTER — Encounter: Payer: Self-pay | Admitting: Family Medicine

## 2018-06-06 ENCOUNTER — Other Ambulatory Visit: Payer: Self-pay

## 2018-06-06 ENCOUNTER — Ambulatory Visit (INDEPENDENT_AMBULATORY_CARE_PROVIDER_SITE_OTHER): Payer: Medicare Other | Admitting: Family Medicine

## 2018-06-06 VITALS — BP 128/72 | HR 73 | Temp 98.5°F | Ht 72.0 in | Wt 184.4 lb

## 2018-06-06 DIAGNOSIS — I10 Essential (primary) hypertension: Secondary | ICD-10-CM | POA: Diagnosis not present

## 2018-06-06 DIAGNOSIS — R0981 Nasal congestion: Secondary | ICD-10-CM

## 2018-06-06 DIAGNOSIS — R252 Cramp and spasm: Secondary | ICD-10-CM | POA: Diagnosis not present

## 2018-06-06 MED ORDER — VALSARTAN 160 MG PO TABS
160.0000 mg | ORAL_TABLET | Freq: Every day | ORAL | 3 refills | Status: DC
Start: 2018-06-06 — End: 2019-03-29

## 2018-06-06 NOTE — Telephone Encounter (Signed)
I called patient, patient is not schedule for surgery until December. It has been 4 weeks since last injection. Patient will call Neurosurgeon and advise.

## 2018-06-06 NOTE — Patient Instructions (Signed)
Leg Cramps Leg cramps occur when a muscle or muscles tighten and you have no control over this tightening (involuntary muscle contraction). Muscle cramps can develop in any muscle, but the most common place is in the calf muscles of the leg. Those cramps can occur during exercise or when you are at rest. Leg cramps are painful, and they may last for a few seconds to a few minutes. Cramps may return several times before they finally stop. Usually, leg cramps are not caused by a serious medical problem. In many cases, the cause is not known. Some common causes include:  Overexertion.  Overuse from repetitive motions, or doing the same thing over and over.  Remaining in a certain position for a long period of time.  Improper preparation, form, or technique while performing a sport or an activity.  Dehydration.  Injury.  Side effects of some medicines.  Abnormally low levels of the salts and ions in your blood (electrolytes), especially potassium and calcium. These levels could be low if you are taking water pills (diuretics) or if you are pregnant.  Follow these instructions at home: Watch your condition for any changes. Taking the following actions may help to lessen any discomfort that you are feeling:  Stay well-hydrated. Drink enough fluid to keep your urine clear or pale yellow.  Try massaging, stretching, and relaxing the affected muscle. Do this for several minutes at a time.  For tight or tense muscles, use a warm towel, heating pad, or hot shower water directed to the affected area.  If you are sore or have pain after a cramp, applying ice to the affected area may relieve discomfort. ? Put ice in a plastic bag. ? Place a towel between your skin and the bag. ? Leave the ice on for 20 minutes, 2-3 times per day.  Avoid strenuous exercise for several days if you have been having frequent leg cramps.  Make sure that your diet includes the essential minerals for your muscles to  work normally.  Take medicines only as directed by your health care provider.  Contact a health care provider if:  Your leg cramps get more severe or more frequent, or they do not improve over time.  Your foot becomes cold, numb, or blue. This information is not intended to replace advice given to you by your health care provider. Make sure you discuss any questions you have with your health care provider. Document Released: 09/08/2004 Document Revised: 01/07/2016 Document Reviewed: 07/09/2014 Elsevier Interactive Patient Education  2018 [employer].  Consider over the counter magnesium oxide 400 mg one daily.  Consider OTC Flonase for  Nasal symptoms.

## 2018-06-06 NOTE — Progress Notes (Signed)
Subjective:     Patient ID: Luis Guzman, male   DOB: 28-Sep-1945, 72 y.o.   MRN: [id number]  HPI Patient here to discuss hypertension.  He had elevated reading last visit we added amlodipine 2.5 mg daily.  He had problems with getting his losartan filled because of recalls and would like to consider alternative to that.  He is compliant with medications.  Also remains on HCTZ.  Blood pressure improved since change of medication  Frequent nasal symptoms of clear drainage for the past several months.  He has tried Sudafed without improvement.  No sneezing.  No purulent changes.  No facial pain.  No fevers or chills.  Frequent leg cramps especially at night.  These involve both calves.  He tried mustard which did not help.  He feels he is drinking plenty of fluids.  Recent potassium normal.  Past Medical History:  Diagnosis Date  . GERD (gastroesophageal reflux disease)   . HYPERLIPIDEMIA 01/26/2009  . HYPERTENSION 01/26/2009  . 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 lower back pain, s/p cervical neck surgery"stiffness" remains  . Transfusion history    infant "anemia"   Past Surgical History:  Procedure Laterality Date  . APPENDECTOMY  ~ 1959  . BACK SURGERY    . CERVICAL LAMINECTOMY  1997   C 4 and C 5  . CHOLECYSTECTOMY N/A 04/15/2014   Procedure: LAPAROSCOPIC CHOLECYSTECTOMY WITH INTRAOPERATIVE CHOLANGIOGRAM;  Surgeon: Armandina Gemma, MD;  Location: [facility];  Service: General;  Laterality: N/A;  . ESOPHAGOGASTRODUODENOSCOPY N/A 03/13/2014   Procedure: ESOPHAGOGASTRODUODENOSCOPY (EGD);  Surgeon: Wonda Horner, MD;  Location: [facility];  Service: Endoscopy;  Laterality: N/A;  . 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  . SHOULDER ARTHROSCOPY    . SHOULDER ARTHROSCOPY W/ ROTATOR CUFF REPAIR Bilateral 2004-2009   left 2004, right 2009  . VASECTOMY      reports that he quit smoking about 49 years ago. His smoking use included cigarettes. He has a 5.00  pack-year smoking history. He has never used smokeless tobacco. He reports that he drank alcohol. He reports that he does not use drugs. family history includes Arthritis in his sister; Hypertension in his father and mother; Stroke (age of onset: 54) in his sister. Allergies  Allergen Reactions  . Cefdinir Other (See Comments)  . Penicillins Itching     Review of Systems  Constitutional: Negative for fatigue.  Eyes: Negative for visual disturbance.  Respiratory: Negative for cough, chest tightness and shortness of breath.   Cardiovascular: Negative for chest pain, palpitations and leg swelling.  Neurological: Negative for dizziness, syncope, weakness, light-headedness and headaches.       Objective:   Physical Exam  Constitutional: He is oriented to person, place, and time. He appears well-developed and well-nourished.  HENT:  Right Ear: External ear normal.  Left Ear: External ear normal.  Mouth/Throat: Oropharynx is clear and moist.  Eyes: Pupils are equal, round, and reactive to light.  Neck: Neck supple. No thyromegaly present.  Cardiovascular: Normal rate and regular rhythm.  Pulmonary/Chest: Effort normal and breath sounds normal. No respiratory distress. He has no wheezes. He has no rales.  Musculoskeletal: He exhibits no edema.  Neurological: He is alert and oriented to person, place, and time.       Assessment:     #1 hypertension improved with recent addition of low-dose amlodipine  #2 rhinorrhea.  Question allergic versus nonallergic rhinitis  #3 frequent leg cramps    Plan:     -  Continue current medications with amlodipine.  We will switch from losartan to valsartan 160 mg daily because of difficulties getting his recent medication filled -Try over-the-counter Flonase for nasal congestive symptoms and avoid Sudafed -Stressed importance of good hydration.  Handout on leg cramps given.  Consider magnesium 400 mg once daily to see if this helps his leg cramps  symptoms.  Consider magnesium level at follow-up if symptoms persist -He may also try some tonic water.  Eulas Post MD [facility] at [facility]

## 2018-06-07 ENCOUNTER — Telehealth (INDEPENDENT_AMBULATORY_CARE_PROVIDER_SITE_OTHER): Payer: Self-pay | Admitting: Orthopaedic Surgery

## 2018-06-07 ENCOUNTER — Other Ambulatory Visit (INDEPENDENT_AMBULATORY_CARE_PROVIDER_SITE_OTHER): Payer: Self-pay | Admitting: Radiology

## 2018-06-07 DIAGNOSIS — M25551 Pain in right hip: Secondary | ICD-10-CM

## 2018-06-07 NOTE — Telephone Encounter (Signed)
Patient called back to let you know he wants rt hip injection at [place].

## 2018-06-07 NOTE — Telephone Encounter (Signed)
SENT IN REFERRAL AND NOIFIED PT

## 2018-06-07 NOTE — Telephone Encounter (Signed)
Patient called stating he left a message at Dr. Clarice Pole office to get "an okay to get another injection."   Patient states he received a voicemail from his assistant who said it was "no problem."

## 2018-06-07 NOTE — Telephone Encounter (Signed)
Ok please schedule

## 2018-06-07 NOTE — Telephone Encounter (Signed)
Please advise 

## 2018-06-25 ENCOUNTER — Ambulatory Visit
Admission: RE | Admit: 2018-06-25 | Discharge: 2018-06-25 | Disposition: A | Discharge status: Home / Self Care | Payer: Medicare Other | Source: Ambulatory Visit | Attending: Orthopaedic Surgery | Admitting: Orthopaedic Surgery

## 2018-06-25 DIAGNOSIS — M25551 Pain in right hip: Secondary | ICD-10-CM

## 2018-06-25 DIAGNOSIS — M1611 Unilateral primary osteoarthritis, right hip: Secondary | ICD-10-CM | POA: Diagnosis not present

## 2018-06-25 MED ORDER — METHYLPREDNISOLONE ACETATE 40 MG/ML INJ SUSP (RADIOLOG
120.0000 mg | Freq: Once | INTRAMUSCULAR | Status: AC
  Administered 2018-06-25: 120 mg via INTRA_ARTICULAR

## 2018-06-25 MED ORDER — IOPAMIDOL (ISOVUE-M 200) INJECTION 41%
1.0000 mL | Freq: Once | INTRAMUSCULAR | Status: AC
  Administered 2018-06-25: 1 mL via INTRA_ARTICULAR

## 2018-07-06 ENCOUNTER — Other Ambulatory Visit (INDEPENDENT_AMBULATORY_CARE_PROVIDER_SITE_OTHER): Payer: Self-pay | Admitting: Orthopaedic Surgery

## 2018-07-17 ENCOUNTER — Other Ambulatory Visit (INDEPENDENT_AMBULATORY_CARE_PROVIDER_SITE_OTHER): Payer: Self-pay | Admitting: *Deleted

## 2018-07-17 ENCOUNTER — Telehealth (INDEPENDENT_AMBULATORY_CARE_PROVIDER_SITE_OTHER): Payer: Self-pay | Admitting: Orthopaedic Surgery

## 2018-07-17 MED ORDER — DICLOFENAC SODIUM 1 % TD GEL: 2.0000 g | Freq: Four times a day (QID) | TRANSDERMAL | 1 refills | Status: DC

## 2018-07-17 NOTE — Telephone Encounter (Signed)
ok 

## 2018-07-17 NOTE — Telephone Encounter (Signed)
OK to RF

## 2018-07-17 NOTE — Telephone Encounter (Signed)
Patient left a voicemail requesting prescription refill of Voltaren 1% Gel to be sent to [facility].

## 2018-07-17 NOTE — Telephone Encounter (Signed)
LMOM for patient, RF sent in

## 2018-07-20 NOTE — Pre-Procedure Instructions (Signed)
Luis Guzman  07/20/2018      [id number] - [place], [place]. AT [address] [place] [address] Phone: [phone] Fax: [phone]  [place], [address] [place] [address] Phone: [phone] Fax: [phone]  [id number] - [place], [address] [place] [address] Phone: [phone] Fax: [phone]    Your procedure is scheduled on December 16th.  Report to [facility] Admitting at [place].  Call this number if you have problems the morning of surgery:  [phone]   Remember:  Do not eat or drink after midnight.    Take these medicines the morning of surgery with A SIP OF WATER  baclofen (LIORESAL) if needed metoprolol succinate (TOPROL-XL) oxyCODONE-acetaminophen (PERCOCET) If needed  7 days prior to surgery STOP taking any diclofenac sodium (VOLTAREN), Aspirin(unless otherwise instructed by your surgeon), Aleve, Naproxen, Ibuprofen, Motrin, Advil, Goody's, BC's, all herbal medications, fish oil, and all vitamins     Do not wear jewelry.  Do not wear lotions, powders, or colognes, or deodorant.  Men may shave face and neck.  Do not bring valuables to the hospital.  [facility] is not responsible for any belongings or valuables.  Contacts, dentures or bridgework may not be worn into surgery.  Leave your suitcase in the car.  After surgery it may be brought to your room.  For patients admitted to the hospital, discharge time will be determined by your treatment team.  Patients discharged the day of surgery will not be allowed to drive home.    [facility]- Preparing For Surgery  Before surgery, you can play an important role. Because skin is not sterile, your skin needs to be as free of germs as possible. You can reduce the number of germs on your skin by washing with  CHG (chlorahexidine gluconate) Soap before surgery.  CHG is an antiseptic cleaner which kills germs and bonds with the skin to continue killing germs even after washing.    Oral Hygiene is also important to reduce your risk of infection.  Remember - BRUSH YOUR TEETH THE MORNING OF SURGERY WITH YOUR REGULAR TOOTHPASTE  Please do not use if you have an allergy to CHG or antibacterial soaps. If your skin becomes reddened/irritated stop using the CHG.  Do not shave (including legs and underarms) for at least 48 hours prior to first CHG shower. It is OK to shave your face.  Please follow these instructions carefully.   1. Shower the NIGHT BEFORE SURGERY and the MORNING OF SURGERY with CHG.   2. If you chose to wash your hair, wash your hair first as usual with your normal shampoo.  3. After you shampoo, rinse your hair and body thoroughly to remove the shampoo.  4. Use CHG as you would any other liquid soap. You can apply CHG directly to the skin and wash gently with a scrungie or a clean washcloth.   5. Apply the CHG Soap to your body ONLY FROM THE NECK DOWN.  Do not use on open wounds or open sores. Avoid contact with your eyes, ears, mouth and genitals (private parts). Wash Face and genitals (private parts)  with your normal soap.  6. Wash thoroughly, paying special attention to the area where your surgery will be performed.  7. Thoroughly rinse your body with warm water  from the neck down.  8. DO NOT shower/wash with your normal soap after using and rinsing off the CHG Soap.  9. Pat yourself dry with a CLEAN TOWEL.  10. Wear CLEAN PAJAMAS to bed the night before surgery, wear comfortable clothes the morning of surgery  11. Place CLEAN SHEETS on your bed the night of your first shower and DO NOT SLEEP WITH PETS.    Day of Surgery:  Do not apply any deodorants/lotions.  Please wear clean clothes to the hospital/surgery center.   Remember to brush your teeth WITH YOUR REGULAR  TOOTHPASTE.    Please read over the following fact sheets that you were given.

## 2018-07-23 ENCOUNTER — Encounter (HOSPITAL_COMMUNITY): Payer: Self-pay

## 2018-07-23 ENCOUNTER — Encounter (HOSPITAL_COMMUNITY)
Admission: RE | Admit: 2018-07-23 | Discharge: 2018-07-23 | Disposition: A | Discharge status: Home / Self Care | Payer: Medicare Other | Source: Ambulatory Visit | Attending: Neurological Surgery | Admitting: Neurological Surgery

## 2018-07-23 ENCOUNTER — Other Ambulatory Visit: Payer: Self-pay

## 2018-07-23 DIAGNOSIS — Z01818 Encounter for other preprocedural examination: Secondary | ICD-10-CM | POA: Diagnosis not present

## 2018-07-23 DIAGNOSIS — I10 Essential (primary) hypertension: Secondary | ICD-10-CM | POA: Diagnosis not present

## 2018-07-23 HISTORY — DX: Personal history of other diseases of the digestive system: Z87.19

## 2018-07-23 LAB — CBC
HCT: 44.1 % (ref 39.0–52.0)
Hemoglobin: 14.7 g/dL (ref 13.0–17.0)
MCH: 30.4 pg (ref 26.0–34.0)
MCHC: 33.3 g/dL (ref 30.0–36.0)
MCV: 91.1 fL (ref 80.0–100.0)
Platelets: 214 10*3/uL (ref 150–400)
RBC: 4.84 MIL/uL (ref 4.22–5.81)
RDW: 12.7 % (ref 11.5–15.5)
WBC: 7.4 10*3/uL (ref 4.0–10.5)
nRBC: 0 % (ref 0.0–0.2)

## 2018-07-23 LAB — BASIC METABOLIC PANEL
Anion gap: 12 (ref 5–15)
BUN: 13 mg/dL (ref 8–23)
CO2: 25 mmol/L (ref 22–32)
Calcium: 9.2 mg/dL (ref 8.9–10.3)
Chloride: 103 mmol/L (ref 98–111)
Creatinine, Ser: 1 mg/dL (ref 0.61–1.24)
GFR calc Af Amer: >60 mL/min (ref >60)
GFR calc non Af Amer: >60 mL/min (ref >60)
Glucose, Bld: 135 mg/dL — ABNORMAL HIGH (ref 70–99)
Potassium: 3.3 mmol/L — ABNORMAL LOW (ref 3.5–5.1)
Sodium: 140 mmol/L (ref 135–145)

## 2018-07-23 LAB — TYPE AND SCREEN
ABO/RH(D): O POS
Antibody Screen: NEGATIVE

## 2018-07-23 LAB — SURGICAL PCR SCREEN
MRSA, PCR: NEGATIVE
Staphylococcus aureus: NEGATIVE

## 2018-07-23 LAB — ABO/RH: ABO/RH(D): O POS

## 2018-07-23 NOTE — Progress Notes (Signed)
PCP - Carolann Littler MD  Chest x-ray - N/A EKG - 07/23/18 Stress Test -  >10 yrs  Blood Thinner Instructions: N/A Aspirin Instructions:N/A  Anesthesia review:  None  Patient denies shortness of breath, fever, cough and chest pain at PAT appointment   Patient verbalized understanding of instructions that were given to them at the PAT appointment. Patient was also instructed that they will need to review over the PAT instructions again at home before surgery.

## 2018-07-26 DIAGNOSIS — M4302 Spondylolysis, cervical region: Secondary | ICD-10-CM | POA: Diagnosis not present

## 2018-07-26 DIAGNOSIS — M5412 Radiculopathy, cervical region: Secondary | ICD-10-CM | POA: Diagnosis not present

## 2018-07-29 NOTE — Anesthesia Preprocedure Evaluation (Addendum)
Anesthesia Evaluation  Patient identified by MRN, date of birth, ID band Patient awake    Reviewed: Allergy & Precautions, H&P , NPO status , Patient's Chart, lab work & pertinent test results, reviewed documented beta blocker date and time   Airway Mallampati: II   Neck ROM: Full    Dental no notable dental hx. (+) Teeth Intact, Dental Advisory Given   Pulmonary neg pulmonary ROS, former smoker,    Pulmonary exam normal breath sounds clear to auscultation       Cardiovascular Exercise Tolerance: Good hypertension, Pt. on medications and Pt. on home beta blockers  Rhythm:Regular Rate:Normal     Neuro/Psych negative neurological ROS  negative psych ROS   GI/Hepatic Neg liver ROS, hiatal hernia, GERD  Controlled,  Endo/Other  negative endocrine ROS  Renal/GU negative Renal ROS  negative genitourinary   Musculoskeletal  (+) Arthritis , Osteoarthritis,    Abdominal   Peds  Hematology negative hematology ROS (+)   Anesthesia Other Findings   Reproductive/Obstetrics negative OB ROS                            Anesthesia Physical Anesthesia Plan  ASA: II  Anesthesia Plan: General   Post-op Pain Management:    Induction: Intravenous  PONV Risk Score and Plan: 3 and Ondansetron, Dexamethasone and Midazolam  Airway Management Planned: Oral ETT  Additional Equipment:   Intra-op Plan:   Post-operative Plan: Extubation in OR  Informed Consent: I have reviewed the patients History and Physical, chart, labs and discussed the procedure including the risks, benefits and alternatives for the proposed anesthesia with the patient or authorized representative who has indicated his/her understanding and acceptance.   Dental advisory given  Plan Discussed with: CRNA  Anesthesia Plan Comments:         Anesthesia Quick Evaluation

## 2018-07-30 ENCOUNTER — Encounter (HOSPITAL_COMMUNITY)
Admission: RE | Disposition: A | Discharge status: Home / Self Care | Payer: Self-pay | Source: Ambulatory Visit | Attending: Neurological Surgery

## 2018-07-30 ENCOUNTER — Ambulatory Visit (HOSPITAL_COMMUNITY): Payer: Medicare Other | Admitting: Certified Registered Nurse Anesthetist

## 2018-07-30 ENCOUNTER — Ambulatory Visit (HOSPITAL_COMMUNITY): Payer: Medicare Other

## 2018-07-30 ENCOUNTER — Encounter (HOSPITAL_COMMUNITY): Payer: Self-pay | Admitting: General Practice

## 2018-07-30 ENCOUNTER — Other Ambulatory Visit: Payer: Self-pay

## 2018-07-30 ENCOUNTER — Observation Stay (HOSPITAL_COMMUNITY)
Admission: RE | Admit: 2018-07-30 | Discharge: 2018-07-30 | Disposition: A | Discharge status: Home / Self Care | Payer: Medicare Other | Source: Ambulatory Visit | Attending: Neurological Surgery | Admitting: Neurological Surgery

## 2018-07-30 DIAGNOSIS — M4802 Spinal stenosis, cervical region: Secondary | ICD-10-CM | POA: Diagnosis not present

## 2018-07-30 DIAGNOSIS — Z79899 Other long term (current) drug therapy: Secondary | ICD-10-CM | POA: Diagnosis not present

## 2018-07-30 DIAGNOSIS — M4722 Other spondylosis with radiculopathy, cervical region: Secondary | ICD-10-CM | POA: Diagnosis not present

## 2018-07-30 DIAGNOSIS — Z87891 Personal history of nicotine dependence: Secondary | ICD-10-CM | POA: Insufficient documentation

## 2018-07-30 DIAGNOSIS — Z981 Arthrodesis status: Secondary | ICD-10-CM | POA: Diagnosis not present

## 2018-07-30 DIAGNOSIS — M4712 Other spondylosis with myelopathy, cervical region: Secondary | ICD-10-CM | POA: Diagnosis not present

## 2018-07-30 DIAGNOSIS — Z419 Encounter for procedure for purposes other than remedying health state, unspecified: Secondary | ICD-10-CM

## 2018-07-30 DIAGNOSIS — K219 Gastro-esophageal reflux disease without esophagitis: Secondary | ICD-10-CM | POA: Diagnosis not present

## 2018-07-30 DIAGNOSIS — I10 Essential (primary) hypertension: Secondary | ICD-10-CM | POA: Diagnosis not present

## 2018-07-30 DIAGNOSIS — M5412 Radiculopathy, cervical region: Secondary | ICD-10-CM | POA: Diagnosis not present

## 2018-07-30 DIAGNOSIS — E785 Hyperlipidemia, unspecified: Secondary | ICD-10-CM | POA: Diagnosis not present

## 2018-07-30 HISTORY — PX: CERVICAL DISC ARTHROPLASTY: SHX587

## 2018-07-30 SURGERY — CERVICAL ANTERIOR DISC ARTHROPLASTY
Anesthesia: General

## 2018-07-30 MED ORDER — PHENOL 1.4 % MT LIQD: 1.0000 | OROMUCOSAL | Status: DC | PRN

## 2018-07-30 MED ORDER — OXYCODONE-ACETAMINOPHEN 5-325 MG PO TABS
1.0000 | ORAL_TABLET | ORAL | Status: DC | PRN
  Administered 2018-07-30: 2 via ORAL

## 2018-07-30 MED ORDER — SODIUM CHLORIDE 0.9 % IV SOLN
INTRAVENOUS | Status: DC | PRN
  Administered 2018-07-30: 07:00:00

## 2018-07-30 MED ORDER — ONDANSETRON HCL 4 MG PO TABS: 4.0000 mg | ORAL_TABLET | Freq: Four times a day (QID) | ORAL | Status: DC | PRN

## 2018-07-30 MED ORDER — VANCOMYCIN HCL IN DEXTROSE 1-5 GM/200ML-% IV SOLN
INTRAVENOUS | Status: AC
  Administered 2018-07-30: 1000 mg via INTRAVENOUS
  Filled 2018-07-30: qty 200

## 2018-07-30 MED ORDER — ONDANSETRON HCL 4 MG/2ML IJ SOLN
INTRAMUSCULAR | Status: AC
  Filled 2018-07-30: qty 2

## 2018-07-30 MED ORDER — PROPOFOL 10 MG/ML IV BOLUS
INTRAVENOUS | Status: AC
  Filled 2018-07-30: qty 20

## 2018-07-30 MED ORDER — HYDROMORPHONE HCL 1 MG/ML IJ SOLN
INTRAMUSCULAR | Status: AC
  Administered 2018-07-30: 0.5 mg via INTRAVENOUS
  Filled 2018-07-30: qty 1

## 2018-07-30 MED ORDER — CHLORHEXIDINE GLUCONATE CLOTH 2 % EX PADS: 6.0000 | MEDICATED_PAD | Freq: Once | CUTANEOUS | Status: DC

## 2018-07-30 MED ORDER — ROCURONIUM BROMIDE 50 MG/5ML IV SOSY
PREFILLED_SYRINGE | INTRAVENOUS | Status: AC
  Filled 2018-07-30: qty 5

## 2018-07-30 MED ORDER — SENNA 8.6 MG PO TABS
1.0000 | ORAL_TABLET | Freq: Two times a day (BID) | ORAL | Status: DC
  Administered 2018-07-30: 8.6 mg via ORAL
  Filled 2018-07-30: qty 1

## 2018-07-30 MED ORDER — LIDOCAINE 2% (20 MG/ML) 5 ML SYRINGE
INTRAMUSCULAR | Status: AC
  Filled 2018-07-30: qty 5

## 2018-07-30 MED ORDER — MORPHINE SULFATE (PF) 2 MG/ML IV SOLN
2.0000 mg | INTRAVENOUS | Status: DC | PRN
  Administered 2018-07-30: 2 mg via INTRAVENOUS
  Filled 2018-07-30: qty 1

## 2018-07-30 MED ORDER — SODIUM CHLORIDE 0.9% FLUSH: 3.0000 mL | INTRAVENOUS | Status: DC | PRN

## 2018-07-30 MED ORDER — DIAZEPAM 5 MG PO TABS
ORAL_TABLET | ORAL | Status: AC
  Filled 2018-07-30: qty 1

## 2018-07-30 MED ORDER — ALUM & MAG HYDROXIDE-SIMETH 200-200-20 MG/5ML PO SUSP: 30.0000 mL | Freq: Four times a day (QID) | ORAL | Status: DC | PRN

## 2018-07-30 MED ORDER — BISACODYL 10 MG RE SUPP: 10.0000 mg | Freq: Every day | RECTAL | Status: DC | PRN

## 2018-07-30 MED ORDER — SUGAMMADEX SODIUM 200 MG/2ML IV SOLN
INTRAVENOUS | Status: DC | PRN
  Administered 2018-07-30: 164.2 mg via INTRAVENOUS

## 2018-07-30 MED ORDER — METOPROLOL SUCCINATE ER 25 MG PO TB24
50.0000 mg | ORAL_TABLET | Freq: Once | ORAL | Status: AC
  Administered 2018-07-30: 50 mg via ORAL
  Filled 2018-07-30: qty 2

## 2018-07-30 MED ORDER — BUPIVACAINE HCL (PF) 0.5 % IJ SOLN
INTRAMUSCULAR | Status: DC | PRN
  Administered 2018-07-30: 6 mL

## 2018-07-30 MED ORDER — ROCURONIUM BROMIDE 10 MG/ML (PF) SYRINGE
PREFILLED_SYRINGE | INTRAVENOUS | Status: DC | PRN
  Administered 2018-07-30: 50 mg via INTRAVENOUS
  Administered 2018-07-30: 30 mg via INTRAVENOUS
  Administered 2018-07-30: 20 mg via INTRAVENOUS

## 2018-07-30 MED ORDER — 0.9 % SODIUM CHLORIDE (POUR BTL) OPTIME
TOPICAL | Status: DC | PRN
  Administered 2018-07-30: 1000 mL

## 2018-07-30 MED ORDER — PHENYLEPHRINE 40 MCG/ML (10ML) SYRINGE FOR IV PUSH (FOR BLOOD PRESSURE SUPPORT)
PREFILLED_SYRINGE | INTRAVENOUS | Status: AC
  Filled 2018-07-30: qty 10

## 2018-07-30 MED ORDER — DIAZEPAM 5 MG PO TABS
ORAL_TABLET | ORAL | Status: AC
  Administered 2018-07-30: 5 mg via ORAL
  Filled 2018-07-30: qty 1

## 2018-07-30 MED ORDER — FENTANYL CITRATE (PF) 250 MCG/5ML IJ SOLN
INTRAMUSCULAR | Status: AC
  Filled 2018-07-30: qty 5

## 2018-07-30 MED ORDER — LIDOCAINE-EPINEPHRINE 1 %-1:100000 IJ SOLN
INTRAMUSCULAR | Status: DC | PRN
  Administered 2018-07-30: 6 mL

## 2018-07-30 MED ORDER — OXYCODONE-ACETAMINOPHEN 5-325 MG PO TABS
1.0000 | ORAL_TABLET | Freq: Four times a day (QID) | ORAL | Status: DC | PRN
  Administered 2018-07-30: 1 via ORAL
  Filled 2018-07-30: qty 1

## 2018-07-30 MED ORDER — POLYETHYLENE GLYCOL 3350 17 G PO PACK: 17.0000 g | PACK | Freq: Every day | ORAL | Status: DC | PRN

## 2018-07-30 MED ORDER — THROMBIN 5000 UNITS EX SOLR
OROMUCOSAL | Status: DC | PRN
  Administered 2018-07-30: 07:00:00 via TOPICAL

## 2018-07-30 MED ORDER — HYDROCHLOROTHIAZIDE 12.5 MG PO CAPS
12.5000 mg | ORAL_CAPSULE | Freq: Every day | ORAL | Status: DC
  Administered 2018-07-30: 12.5 mg via ORAL
  Filled 2018-07-30: qty 1

## 2018-07-30 MED ORDER — ONDANSETRON HCL 4 MG/2ML IJ SOLN
INTRAMUSCULAR | Status: DC | PRN
  Administered 2018-07-30: 4 mg via INTRAVENOUS

## 2018-07-30 MED ORDER — FENTANYL CITRATE (PF) 250 MCG/5ML IJ SOLN
INTRAMUSCULAR | Status: DC | PRN
  Administered 2018-07-30: 100 ug via INTRAVENOUS
  Administered 2018-07-30 (×3): 50 ug via INTRAVENOUS

## 2018-07-30 MED ORDER — LIDOCAINE-EPINEPHRINE 1 %-1:100000 IJ SOLN
INTRAMUSCULAR | Status: AC
  Filled 2018-07-30: qty 1

## 2018-07-30 MED ORDER — LACTATED RINGERS IV SOLN
INTRAVENOUS | Status: DC
  Administered 2018-07-30: 07:00:00 via INTRAVENOUS

## 2018-07-30 MED ORDER — OXYCODONE-ACETAMINOPHEN 5-325 MG PO TABS
ORAL_TABLET | ORAL | Status: AC
  Administered 2018-07-30: 2 via ORAL
  Filled 2018-07-30: qty 2

## 2018-07-30 MED ORDER — MIDAZOLAM HCL 2 MG/2ML IJ SOLN
INTRAMUSCULAR | Status: AC
  Filled 2018-07-30: qty 2

## 2018-07-30 MED ORDER — DIAZEPAM 5 MG PO TABS
5.0000 mg | ORAL_TABLET | Freq: Four times a day (QID) | ORAL | Status: DC | PRN
  Administered 2018-07-30 (×2): 5 mg via ORAL
  Filled 2018-07-30 (×2): qty 1

## 2018-07-30 MED ORDER — DEXAMETHASONE SODIUM PHOSPHATE 10 MG/ML IJ SOLN
INTRAMUSCULAR | Status: AC
  Filled 2018-07-30: qty 1

## 2018-07-30 MED ORDER — ONDANSETRON HCL 4 MG/2ML IJ SOLN: 4.0000 mg | Freq: Four times a day (QID) | INTRAMUSCULAR | Status: DC | PRN

## 2018-07-30 MED ORDER — ACETAMINOPHEN 325 MG PO TABS: 650.0000 mg | ORAL_TABLET | ORAL | Status: DC | PRN

## 2018-07-30 MED ORDER — METOPROLOL SUCCINATE ER 50 MG PO TB24: 50.0000 mg | ORAL_TABLET | Freq: Every day | ORAL | Status: DC

## 2018-07-30 MED ORDER — SODIUM CHLORIDE 0.9 % IV SOLN
INTRAVENOUS | Status: DC | PRN
  Administered 2018-07-30: 25 ug/min via INTRAVENOUS

## 2018-07-30 MED ORDER — IRBESARTAN 150 MG PO TABS
150.0000 mg | ORAL_TABLET | Freq: Every day | ORAL | Status: DC
  Administered 2018-07-30: 150 mg via ORAL
  Filled 2018-07-30: qty 1

## 2018-07-30 MED ORDER — MIDAZOLAM HCL 2 MG/2ML IJ SOLN
INTRAMUSCULAR | Status: DC | PRN
  Administered 2018-07-30: 2 mg via INTRAVENOUS

## 2018-07-30 MED ORDER — METHOCARBAMOL 500 MG PO TABS
500.0000 mg | ORAL_TABLET | Freq: Four times a day (QID) | ORAL | Status: DC | PRN
  Administered 2018-07-30: 500 mg via ORAL
  Filled 2018-07-30: qty 1

## 2018-07-30 MED ORDER — ACETAMINOPHEN 650 MG RE SUPP: 650.0000 mg | RECTAL | Status: DC | PRN

## 2018-07-30 MED ORDER — SODIUM CHLORIDE 0.9% FLUSH
3.0000 mL | Freq: Two times a day (BID) | INTRAVENOUS | Status: DC
  Administered 2018-07-30: 3 mL via INTRAVENOUS

## 2018-07-30 MED ORDER — VANCOMYCIN HCL IN DEXTROSE 1-5 GM/200ML-% IV SOLN
1000.0000 mg | INTRAVENOUS | Status: AC
  Administered 2018-07-30: 1000 mg via INTRAVENOUS

## 2018-07-30 MED ORDER — METHOCARBAMOL 1000 MG/10ML IJ SOLN
500.0000 mg | Freq: Four times a day (QID) | INTRAVENOUS | Status: DC | PRN
  Filled 2018-07-30: qty 5

## 2018-07-30 MED ORDER — HYDROMORPHONE HCL 1 MG/ML IJ SOLN
0.2500 mg | INTRAMUSCULAR | Status: DC | PRN
  Administered 2018-07-30 (×4): 0.5 mg via INTRAVENOUS

## 2018-07-30 MED ORDER — DEXAMETHASONE SODIUM PHOSPHATE 10 MG/ML IJ SOLN
INTRAMUSCULAR | Status: DC | PRN
  Administered 2018-07-30: 10 mg via INTRAVENOUS

## 2018-07-30 MED ORDER — PROPOFOL 10 MG/ML IV BOLUS
INTRAVENOUS | Status: DC | PRN
  Administered 2018-07-30: 100 mg via INTRAVENOUS

## 2018-07-30 MED ORDER — OXYCODONE-ACETAMINOPHEN 10-325 MG PO TABS: 1.0000 | ORAL_TABLET | Freq: Four times a day (QID) | ORAL | Status: DC | PRN

## 2018-07-30 MED ORDER — OXYCODONE HCL 5 MG PO TABS
5.0000 mg | ORAL_TABLET | Freq: Four times a day (QID) | ORAL | Status: DC | PRN
  Administered 2018-07-30 (×2): 5 mg via ORAL
  Filled 2018-07-30 (×2): qty 1

## 2018-07-30 MED ORDER — FLEET ENEMA 7-19 GM/118ML RE ENEM: 1.0000 | ENEMA | Freq: Once | RECTAL | Status: DC | PRN

## 2018-07-30 MED ORDER — THROMBIN 5000 UNITS EX SOLR
CUTANEOUS | Status: AC
  Filled 2018-07-30: qty 5000

## 2018-07-30 MED ORDER — LIDOCAINE 2% (20 MG/ML) 5 ML SYRINGE
INTRAMUSCULAR | Status: DC | PRN
  Administered 2018-07-30: 100 mg via INTRAVENOUS

## 2018-07-30 MED ORDER — SODIUM CHLORIDE 0.9 % IV SOLN: 250.0000 mL | INTRAVENOUS | Status: DC

## 2018-07-30 MED ORDER — MENTHOL 3 MG MT LOZG: 1.0000 | LOZENGE | OROMUCOSAL | Status: DC | PRN

## 2018-07-30 MED ORDER — LATANOPROST 0.005 % OP SOLN
1.0000 [drp] | Freq: Every day | OPHTHALMIC | Status: DC
  Filled 2018-07-30: qty 2.5

## 2018-07-30 MED ORDER — KETOROLAC TROMETHAMINE 15 MG/ML IJ SOLN
7.5000 mg | Freq: Four times a day (QID) | INTRAMUSCULAR | Status: DC
  Administered 2018-07-30 (×2): 7.5 mg via INTRAVENOUS
  Filled 2018-07-30 (×2): qty 1

## 2018-07-30 MED ORDER — OXYCODONE-ACETAMINOPHEN 5-325 MG PO TABS: 1.0000 | ORAL_TABLET | ORAL | 0 refills | Status: DC | PRN

## 2018-07-30 MED ORDER — DIAZEPAM 5 MG PO TABS: 5.0000 mg | ORAL_TABLET | Freq: Three times a day (TID) | ORAL | 0 refills | Status: DC | PRN

## 2018-07-30 MED ORDER — BACLOFEN 10 MG PO TABS
10.0000 mg | ORAL_TABLET | Freq: Every day | ORAL | Status: DC | PRN
  Administered 2018-07-30: 10 mg via ORAL
  Filled 2018-07-30 (×2): qty 1

## 2018-07-30 MED ORDER — DOCUSATE SODIUM 100 MG PO CAPS
100.0000 mg | ORAL_CAPSULE | Freq: Two times a day (BID) | ORAL | Status: DC
  Administered 2018-07-30: 100 mg via ORAL
  Filled 2018-07-30: qty 1

## 2018-07-30 MED ORDER — BUPIVACAINE HCL (PF) 0.5 % IJ SOLN
INTRAMUSCULAR | Status: AC
  Filled 2018-07-30: qty 30

## 2018-07-30 SURGICAL SUPPLY — 50 items
ADH SKN CLS APL DERMABOND .7 (GAUZE/BANDAGES/DRESSINGS) ×1
ALCOHOL ISOPROPYL (RUBBING) (MISCELLANEOUS) ×2 IMPLANT
APL SKNCLS STERI-STRIP NONHPOA (GAUZE/BANDAGES/DRESSINGS) ×1
BAG DECANTER FOR FLEXI CONT (MISCELLANEOUS) ×2 IMPLANT
BENZOIN TINCTURE PRP APPL 2/3 (GAUZE/BANDAGES/DRESSINGS) ×2 IMPLANT
BIT DRILL NEURO 2X3.1 SFT TUCH (MISCELLANEOUS) ×1 IMPLANT
CANISTER SUCT 3000ML PPV (MISCELLANEOUS) ×2 IMPLANT
COVER WAND RF STERILE (DRAPES) ×2 IMPLANT
DECANTER SPIKE VIAL GLASS SM (MISCELLANEOUS) ×2 IMPLANT
DERMABOND ADVANCED (GAUZE/BANDAGES/DRESSINGS) ×1
DERMABOND ADVANCED .7 DNX12 (GAUZE/BANDAGES/DRESSINGS) ×1 IMPLANT
DISC MOBI-C CERVICAL 17X17 H5 (Screw) ×2 IMPLANT
DRAPE C-ARM 42X72 X-RAY (DRAPES) ×4 IMPLANT
DRAPE C-ARMOR (DRAPES) ×2 IMPLANT
DRAPE LAPAROTOMY 100X72 PEDS (DRAPES) ×2 IMPLANT
DRAPE MICROSCOPE LEICA (MISCELLANEOUS) IMPLANT
DRAPE POUCH INSTRU U-SHP 10X18 (DRAPES) ×2 IMPLANT
DRILL NEURO 2X3.1 SOFT TOUCH (MISCELLANEOUS) ×2
DURAPREP 6ML APPLICATOR 50/CS (WOUND CARE) ×2 IMPLANT
ELECT REM PT RETURN 9FT ADLT (ELECTROSURGICAL) ×2
ELECTRODE REM PT RTRN 9FT ADLT (ELECTROSURGICAL) ×1 IMPLANT
GAUZE 4X4 16PLY RFD (DISPOSABLE) IMPLANT
GLOVE BIOGEL PI IND STRL 8 (GLOVE) IMPLANT
GLOVE BIOGEL PI IND STRL 8.5 (GLOVE) ×1 IMPLANT
GLOVE BIOGEL PI INDICATOR 8 (GLOVE) ×1
GLOVE BIOGEL PI INDICATOR 8.5 (GLOVE) ×1
GLOVE ECLIPSE 7.5 STRL STRAW (GLOVE) ×3 IMPLANT
GLOVE ECLIPSE 8.5 STRL (GLOVE) ×2 IMPLANT
GOWN STRL REUS W/ TWL LRG LVL3 (GOWN DISPOSABLE) IMPLANT
GOWN STRL REUS W/ TWL XL LVL3 (GOWN DISPOSABLE) ×1 IMPLANT
GOWN STRL REUS W/TWL 2XL LVL3 (GOWN DISPOSABLE) ×2 IMPLANT
GOWN STRL REUS W/TWL LRG LVL3 (GOWN DISPOSABLE)
GOWN STRL REUS W/TWL XL LVL3 (GOWN DISPOSABLE) ×2
HEMOSTAT POWDER KIT SURGIFOAM (HEMOSTASIS) ×2 IMPLANT
KIT BASIN OR (CUSTOM PROCEDURE TRAY) ×2 IMPLANT
KIT TURNOVER KIT B (KITS) ×2 IMPLANT
NDL SPNL 22GX3.5 QUINCKE BK (NEEDLE) ×1 IMPLANT
NEEDLE HYPO 22GX1.5 SAFETY (NEEDLE) ×2 IMPLANT
NEEDLE SPNL 22GX3.5 QUINCKE BK (NEEDLE) ×2 IMPLANT
NS IRRIG 1000ML POUR BTL (IV SOLUTION) ×2 IMPLANT
PACK LAMINECTOMY NEURO (CUSTOM PROCEDURE TRAY) ×2 IMPLANT
PAD ARMBOARD 7.5X6 YLW CONV (MISCELLANEOUS) ×6 IMPLANT
PATTIES SURGICAL .5 X1 (DISPOSABLE) ×2 IMPLANT
RUBBERBAND STERILE (MISCELLANEOUS) IMPLANT
SPONGE INTESTINAL PEANUT (DISPOSABLE) ×2 IMPLANT
SUT VIC AB 4-0 RB1 18 (SUTURE) ×2 IMPLANT
TAPE CLOTH 4X10 WHT NS (GAUZE/BANDAGES/DRESSINGS) IMPLANT
TOWEL GREEN STERILE (TOWEL DISPOSABLE) ×2 IMPLANT
TOWEL GREEN STERILE FF (TOWEL DISPOSABLE) ×2 IMPLANT
WATER STERILE IRR 1000ML POUR (IV SOLUTION) ×2 IMPLANT

## 2018-07-30 NOTE — Anesthesia Procedure Notes (Signed)
Procedure Name: Intubation Date/Time: 07/30/2018 7:40 AM Performed by: Alain Marion, CRNA Pre-anesthesia Checklist: Patient identified, Emergency Drugs available, Suction available and Patient being monitored Patient Re-evaluated:Patient Re-evaluated prior to induction Oxygen Delivery Method: Circle System Utilized Preoxygenation: Pre-oxygenation with 100% oxygen Induction Type: IV induction Ventilation: Mask ventilation without difficulty Laryngoscope Size: Miller and 2 Grade View: Grade I Tube type: Oral Tube size: 7.0 mm Number of attempts: 1 Airway Equipment and Method: Stylet and Oral airway Placement Confirmation: ETT inserted through vocal cords under direct vision,  positive ETCO2 and breath sounds checked- equal and bilateral Secured at: 23 cm Tube secured with: Tape Dental Injury: Teeth and Oropharynx as per pre-operative assessment

## 2018-07-30 NOTE — H&P (Signed)
Luis Guzman is an 72 y.o. male.   Chief Complaint: Neck shoulder and right arm pain and weakness HPI: Luis Guzman is a 72 year old individual who has had problems with the neck and shoulder for over a year's period of time.  In the last 6 months he is developed increasing pain in the right triceps region and has developed weakness in the right triceps muscle decreased grip strength on the right side.  Is failed extensive efforts at conservative management including physical therapy and was advised regarding surgical decompression stabilization at C5-6 and C6-C7 with an arthroplasty.  Now admitted for that procedure.  Past Medical History:  Diagnosis Date  . GERD (gastroesophageal reflux disease)   . History of hiatal hernia    40 yrs ago  . HYPERLIPIDEMIA 01/26/2009  . HYPERTENSION 01/26/2009  . 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 lower back pain, s/p cervical neck surgery"stiffness" remains  . Transfusion history    infant "anemia"    Past Surgical History:  Procedure Laterality Date  . APPENDECTOMY  ~ 1959  . BACK SURGERY    . CERVICAL LAMINECTOMY  1997   C 4 and C 5  . CHOLECYSTECTOMY N/A 04/15/2014   Procedure: LAPAROSCOPIC CHOLECYSTECTOMY WITH INTRAOPERATIVE CHOLANGIOGRAM;  Surgeon: Armandina Gemma, MD;  Location: [facility];  Service: General;  Laterality: N/A;  . COLONOSCOPY    . ESOPHAGOGASTRODUODENOSCOPY N/A 03/13/2014   Procedure: ESOPHAGOGASTRODUODENOSCOPY (EGD);  Surgeon: Wonda Horner, MD;  Location: [facility] ENDOSCOPY;  Service: Endoscopy;  Laterality: N/A;  . 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  . SHOULDER ARTHROSCOPY Bilateral   . SHOULDER ARTHROSCOPY W/ ROTATOR CUFF REPAIR Bilateral 2004-2009   left 2004, right 2009  . VASECTOMY    . WISDOM TOOTH EXTRACTION      Family History  Problem Relation Age of Onset  . Hypertension Mother   . Hypertension Father   . Arthritis Sister        rheumatoid  . Stroke Sister 68   Social History:   reports that he quit smoking about 49 years ago. His smoking use included cigarettes. He has a 5.00 pack-year smoking history. He has never used smokeless tobacco. He reports previous alcohol use. He reports that he does not use drugs.  Allergies:  Allergies  Allergen Reactions  . Penicillins Itching    Has patient had a PCN reaction causing immediate rash, facial/tongue/throat swelling, SOB or lightheadedness with hypotension: no Has patient had a PCN reaction causing severe rash involving mucus membranes or skin necrosis: unkn Has patient had a PCN reaction that required hospitalization: no Has patient had a PCN reaction occurring within the last 10 years: no If all of the above answers are "NO", then may proceed with Cephalosporin use.     Medications Prior to Admission  Medication Sig Dispense Refill  . baclofen (LIORESAL) 10 MG tablet Take 10 mg by mouth daily as needed for muscle spasms.    . bimatoprost (LUMIGAN) 0.03 % ophthalmic solution Place 1 drop into both eyes at bedtime.    . diclofenac sodium (VOLTAREN) 1 % GEL Apply 2 g topically 4 (four) times daily. (Patient taking differently: Apply 2 g topically 2 (two) times daily as needed (pain). ) 3 Tube 1  . hydrochlorothiazide (MICROZIDE) 12.5 MG capsule Take 1 capsule (12.5 mg total) by mouth daily. 90 capsule 3  . metoprolol succinate (TOPROL-XL) 50 MG 24 hr tablet TAKE 1 TABLET BY MOUTH  DAILY WITH OR IMMEDIATLEY  FOLLOWING  A MEAL 90 tablet 3  . oxyCODONE-acetaminophen (PERCOCET) 10-325 MG per tablet Take 1 tablet by mouth every 6 (six) hours as needed for pain.    . valsartan (DIOVAN) 160 MG tablet Take 1 tablet (160 mg total) by mouth daily. 90 tablet 3  . amLODipine (NORVASC) 2.5 MG tablet Take 1 tablet (2.5 mg total) by mouth daily. (Patient not taking: Reported on 07/17/2018) 90 tablet 3    No results found for this or any previous visit (from the past 48 hour(s)). No results found.  Review of Systems  Constitutional:  Negative.   HENT: Negative.   Eyes: Negative.   Respiratory: Negative.   Cardiovascular: Negative.   Gastrointestinal: Negative.   Genitourinary: Negative.   Musculoskeletal: Positive for neck pain.  Skin: Negative.   Neurological:       Right upper extremity pain numbness and weakness  Endo/Heme/Allergies: Negative.   Psychiatric/Behavioral: Negative.     Blood pressure (!) 171/70, pulse 65, temperature 98 F (36.7 C), temperature source Oral, resp. rate 18, height 6' (1.829 m), weight 82.1 kg, SpO2 99 %. Physical Exam  Constitutional: He is oriented to person, place, and time. He appears well-developed and well-nourished.  HENT:  Head: Normocephalic and atraumatic.  Eyes: Pupils are equal, round, and reactive to light. Conjunctivae and EOM are normal.  Neck:  Positive Spurling on the right  Cardiovascular: Normal rate and regular rhythm.  Respiratory: Effort normal and breath sounds normal.  Musculoskeletal:     Comments: Decreased range of motion turning right and left to 60 degrees  Neurological: He is alert and oriented to person, place, and time.  Absent deep tendon reflexes in the right bicep and tricep 2+ reflexes on the left side in both lower extremities Station and gait are intact, mild weakness in the right triceps 4 out of 5  Skin: Skin is warm and dry.  Psychiatric: He has a normal mood and affect. His behavior is normal. Judgment and thought content normal.     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  Plan: Decompression arthroplasty C5-6 and C6-C7  Earleen Newport, MD 07/30/2018, 7:29 AM

## 2018-07-30 NOTE — Transfer of Care (Signed)
Immediate Anesthesia Transfer of Care Note  Patient: Luis Guzman  Procedure(s) Performed: Cervical five-six Cervical six-seven Artificial disc replacement (N/A )  Patient Location: PACU  Anesthesia Type:General  Level of Consciousness: awake, alert  and oriented  Airway & Oxygen Therapy: Patient Spontanous Breathing and Patient connected to nasal cannula oxygen  Post-op Assessment: Report given to RN and Post -op Vital signs reviewed and stable  Post vital signs: Reviewed and stable  Last Vitals:  Vitals Value Taken Time  BP 151/72 07/30/2018 10:13 AM  Temp    Pulse 75 07/30/2018 10:14 AM  Resp 12 07/30/2018 10:14 AM  SpO2 100 % 07/30/2018 10:14 AM  Vitals shown include unvalidated device data.  Last Pain:  Vitals:   07/30/18 1011  TempSrc:   PainSc: (P) 0-No pain      Patients Stated Pain Goal: 3 (67/59/16 3846)  Complications: No apparent anesthesia complications

## 2018-07-30 NOTE — Discharge Summary (Signed)
Physician Discharge Summary  Patient ID: Luis Guzman MRN: [id number] DOB/AGE: 03/03/1946 72 y.o.  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  Consults: None  Significant Diagnostic Studies: None  Treatments: surgery: Anterior cervical decompression C5-6 and C6-7 arthroplasty with Mobi-C  Discharge Exam: Blood pressure 132/77, pulse 65, temperature 98.1 F (36.7 C), temperature source Oral, resp. rate 18, height 6' (1.829 m), weight 82.1 kg, SpO2 93 %. Incision is clean and dry Station and gait are intact  Disposition: Discharge disposition: 01-Home or Self Care       Discharge Instructions    Call MD for:  redness, tenderness, or signs of infection (pain, swelling, redness, odor or green/yellow discharge around incision site)   Complete by:  As directed    Call MD for:  severe uncontrolled pain   Complete by:  As directed    Call MD for:  temperature >100.4   Complete by:  As directed    Diet - low sodium heart healthy   Complete by:  As directed    Discharge instructions   Complete by:  As directed    Okay to shower. Do not apply salves or appointments to incision. No heavy lifting with the upper extremities greater than 15 pounds. May resume driving when not requiring pain medication and patient feels comfortable with doing so.   Incentive spirometry RT   Complete by:  As directed    Increase activity slowly   Complete by:  As directed      Allergies as of 07/30/2018      Reactions   Penicillins Itching   Has patient had a PCN reaction causing immediate rash,  facial/tongue/throat swelling, SOB or lightheadedness with hypotension: no Has patient had a PCN reaction causing severe rash involving mucus membranes or skin necrosis: unkn Has patient had a PCN reaction that required hospitalization: no Has patient had a PCN reaction occurring within the last 10 years: no If all of the above answers are "NO", then may proceed with Cephalosporin use.      Medication List    TAKE these medications   amLODipine 2.5 MG tablet Commonly known as:  NORVASC Take 1 tablet (2.5 mg total) by mouth daily.   baclofen 10 MG tablet Commonly known as:  LIORESAL Take 10 mg by mouth daily as needed for muscle spasms.   bimatoprost 0.03 % ophthalmic solution Commonly known as:  LUMIGAN Place 1 drop into both eyes at bedtime.   diazepam 5 MG tablet Commonly known as:  VALIUM Take 1 tablet (5 mg total) by mouth every 8 (eight) hours as needed for muscle spasms.   diclofenac sodium 1 % Gel Commonly known as:  VOLTAREN Apply 2 g topically 4 (four) times daily. What changed:    when to take this  reasons to take this   hydrochlorothiazide 12.5 MG capsule Commonly known as:  MICROZIDE Take 1 capsule (12.5 mg total) by mouth daily.   metoprolol succinate 50 MG 24 hr tablet Commonly known as:  TOPROL-XL TAKE 1 TABLET BY MOUTH  DAILY WITH OR IMMEDIATLEY  FOLLOWING A MEAL   oxyCODONE-acetaminophen 10-325 MG tablet Commonly known as:  PERCOCET Take 1 tablet by  mouth every 6 (six) hours as needed for pain. What changed:  Another medication with the same name was added. Make sure you understand how and when to take each.   oxyCODONE-acetaminophen 5-325 MG tablet Commonly known as:  PERCOCET Take 1-2 tablets by mouth every 4 (four) hours as needed for moderate pain or severe pain. What changed:  You were already taking a medication with the same name, and this prescription was added. Make sure you understand how and when to take each.   valsartan 160 MG  tablet Commonly known as:  DIOVAN Take 1 tablet (160 mg total) by mouth daily.        Signed: Earleen Newport 07/30/2018, 6:41 PM

## 2018-07-30 NOTE — Progress Notes (Signed)
Pt admitted to the unit from PACU. Pt A&O x4; MAE x4; pt oriented to the unit and room; fall/safety precaution and prevention education completed with pt and spouse at bedside; all voices understanding and denies any questions. SCD's on; due to void per report; neck incision remains clean dry and intact with derma-bond opened to air. VSS: call light within reach. Will continue to closely monitor. Delia Heady RN   07/30/18 1110  Vitals  Temp 98.3 F (36.8 C)  Temp Source Oral  BP (!) 144/85  MAP (mmHg) 102  BP Location Left Arm  BP Method Automatic  Patient Position (if appropriate) Lying  Pulse Rate 62  Resp 18  Oxygen Therapy  SpO2 100 %  O2 Device Room Air

## 2018-07-30 NOTE — Progress Notes (Signed)
Patient ID: Luis Guzman, male   DOB: May 11, 1946, 72 y.o.   MRN: [id number] Signs are stable Motor function appears intact Doing well We will discharge home today

## 2018-07-30 NOTE — Discharge Instructions (Signed)
°  Scheduled Meds:  diazepam       docusate sodium  100 mg Oral BID   hydrochlorothiazide  12.5 mg Oral Daily   irbesartan  150 mg Oral Daily   ketorolac  7.5 mg Intravenous Q6H   latanoprost  1 drop Both Eyes QHS   metoprolol succinate  50 mg Oral Daily   senna  1 tablet Oral BID   sodium chloride flush  3 mL Intravenous Q12H   Continuous Infusions:  sodium chloride Stopped (07/30/18 1204)   lactated ringers Stopped (07/30/18 0957)   methocarbamol (ROBAXIN) IV     PRN Meds:acetaminophen **OR** acetaminophen, alum & mag hydroxide-simeth, baclofen, bisacodyl, diazepam, menthol-cetylpyridinium **OR** phenol, methocarbamol **OR** methocarbamol (ROBAXIN) IV, morphine injection, ondansetron **OR** ondansetron (ZOFRAN) IV, oxyCODONE-acetaminophen **AND** oxyCODONE, polyethylene glycol, sodium chloride flush, sodium phosphate Personal Items   Please collect all clothing which belongs to you from your nurse. Please collect any valuables you stored during your stay from the front desk, and please remember all of your personal items, such as dentures, canes, and eyeglasses.   Activity Instructions

## 2018-07-30 NOTE — Anesthesia Postprocedure Evaluation (Signed)
Anesthesia Post Note  Patient: HILMAR MOLDOVAN  Procedure(s) Performed: Cervical five-six Cervical six-seven Artificial disc replacement (N/A )     Patient location during evaluation: PACU Anesthesia Type: General Level of consciousness: awake and alert Pain management: pain level controlled Vital Signs Assessment: post-procedure vital signs reviewed and stable Respiratory status: spontaneous breathing, nonlabored ventilation and respiratory function stable Cardiovascular status: blood pressure returned to baseline and stable Postop Assessment: no apparent nausea or vomiting Anesthetic complications: no    Last Vitals:  Vitals:   07/30/18 1053 07/30/18 1110  BP:  (!) 144/85  Pulse:  62  Resp:  18  Temp: 36.5 C 36.8 C  SpO2:  100%    Last Pain:  Vitals:   07/30/18 1110  TempSrc: Oral  PainSc:                  Nene Aranas,W. EDMOND

## 2018-07-30 NOTE — Progress Notes (Signed)
Pt voided x1 in BR; pt ambulated with spouse and RN supervision in hallway about 450 ft. Delia Heady RN

## 2018-07-30 NOTE — Op Note (Signed)
Date of surgery: 07/30/2018 Preoperative diagnosis: Cervical spondylosis with myelopathy and radiculopathy on the right C5-6 and C6-C7 Postoperative diagnosis: Same Procedure: Anterior cervical decompression C5-6 and C6-7 arthroplasty with Mo BC 17 x 17 x 5 mm tall arthroplasty device at C5-6 and C6-C7 Surgeon: Kristeen Miss First Assistant: Kary Kos MD Anesthesia: General endotracheal Indications: Luis Guzman is a 72 year old male whose had significant right cervical radiculopathy for some time he has advanced spondylosis at C5-6 and C6-C7 with right-sided foraminal stenosis.  Is been advised regarding surgical decompression and stabilization using an arthroplasty device.  Procedure: The patient was brought to the operating room supine on a stretcher.  After the smooth induction of general endotracheal anesthesia his neck was prepped with alcohol and DuraPrep and draped in a sterile fashion a piece of tape was used to center his neck in the midline in the supine position on a donut rest.  Fluoroscopic imaging was used to localize C5-6 and C6-7 and the incision to approach these areas.  Transverse incision was created on the left side neck and carried down through the platysma.  The plane between the sternocleidomastoid and strap muscles was dissected bluntly until the prevertebral space was reached.  First identifiable disc space was noted to be that of C5-6.  Large ventral osteophytes were removed using a Leksell rongeur.  The disc space was then entered and a substantial quantity of severely degenerated and desiccated disc material was removed.  Region of the posterior longitudinal ligament was reached and here there was significant bony osteophytosis from the inferior margin body of C5 out to the lateral recesses.  This was taken down with a high-speed drill the ligament was opened and dissection was carried out to the right side where there was substantial osteophytic material in the lateral recess and  the right foramen.  On the left side similar decompression was undertaken though less of an osteophyte was encountered.  In the end of the common dural tube and both nerve roots were well decompressed after foramen the interspace was then sized for appropriate size spacer and was felt that a 17 mm x 17 mm x 5 mm tall spacer would fit best into this interval.  This was then placed under fluoroscopic guidance checking the AP and lateral projections.  Once placed appropriately was released.  Attention was then turned to C6-C7 where a similar process was carried out here again noting that there was substantial osteophytosis into the right lateral recess causing compression for the right C7 the nerve root.  Once this was decompressed adequately the same size spacer was chosen and placed into the interspace under fluoroscopic guidance.  Hemostasis in the soft tissues was then carefully assessed and once verified the retractors were removed the cervical wound was closed with 4-0 Vicryl in the platysma and 4-0 Vicryl in the subcuticular tissues Dermabond was placed on the skin.  Blood loss was estimated at 150 mL.  Patient tolerated procedure well was returned to recovery room stable condition.

## 2018-07-30 NOTE — Progress Notes (Signed)
Pt ambulated x2 in hallway estimating 878ft with NT supervision. Delia Heady RN

## 2018-07-30 NOTE — Progress Notes (Signed)
Patient  Discharged to home accompanied by family member. Discharge instruction given and verbalized understanding. Prescription also given to patient. No s/s of distress at the time of discharge.

## 2018-07-30 NOTE — Progress Notes (Signed)
Orthopedic Tech Progress Note Patient Details:  Luis Guzman October 01, 1945 [id number]  Ortho Devices Type of Ortho Device: Soft collar Ortho Device/Splint Location: rn requested for neck pt. Ortho Device/Splint Interventions: Ordered, Application, Adjustment   Post Interventions Patient Tolerated: Well Instructions Provided: Care of device, Adjustment of device   Karolee Stamps 07/30/2018, 3:37 PM

## 2018-07-31 ENCOUNTER — Encounter (HOSPITAL_COMMUNITY): Payer: Self-pay | Admitting: Neurological Surgery

## 2018-08-22 DIAGNOSIS — I10 Essential (primary) hypertension: Secondary | ICD-10-CM | POA: Diagnosis not present

## 2018-08-22 DIAGNOSIS — M4302 Spondylolysis, cervical region: Secondary | ICD-10-CM | POA: Diagnosis not present

## 2018-09-18 ENCOUNTER — Telehealth (INDEPENDENT_AMBULATORY_CARE_PROVIDER_SITE_OTHER): Payer: Self-pay | Admitting: Orthopaedic Surgery

## 2018-09-18 NOTE — Telephone Encounter (Signed)
Ok to order 

## 2018-09-18 NOTE — Telephone Encounter (Signed)
Patient had rt hip injection done at [facility] back in December. Patient would like to get another injection. Requesting doctor to order second injection, if possible.

## 2018-09-18 NOTE — Telephone Encounter (Signed)
Please advise 

## 2018-09-20 ENCOUNTER — Other Ambulatory Visit (INDEPENDENT_AMBULATORY_CARE_PROVIDER_SITE_OTHER): Payer: Self-pay | Admitting: Orthopaedic Surgery

## 2018-09-20 DIAGNOSIS — M25551 Pain in right hip: Secondary | ICD-10-CM

## 2018-09-20 NOTE — Telephone Encounter (Signed)
Ordered right hip injection and notified Roberta at [place].

## 2018-09-23 IMAGING — MR MR SHOULDER*R* W/O CM
4 of 5 series · 14 of 40 positions shown · non-contrast
Comparison: None.

CLINICAL DATA: Right shoulder pain. Six months of shoulder pain and
weakness. No known injury.

EXAM:
MRI OF THE RIGHT SHOULDER WITHOUT CONTRAST
TECHNIQUE: Multiplanar, multisequence MR imaging of the shoulder was performed.
No intravenous contrast was administered.

[Series 6: T2 fat-sat · axial · right · 3.0mm · 0.55mm/px · z∈[-46,+28]mm · 3 of 30 slices shown (1 of 3)]
[im 4/30]
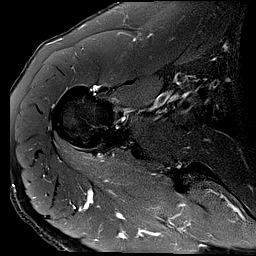
[im 17/30]
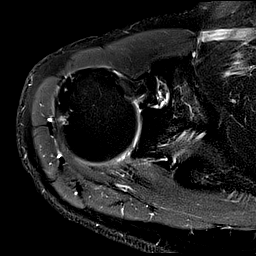
[im 26/30]
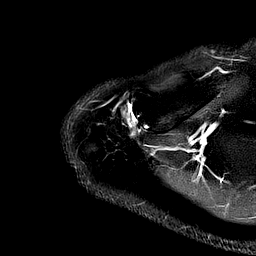

[Series 7: T2 fat-sat · sagittal · right · 3.0mm · 0.44mm/px · 3 of 27 slices shown (2 of 3)]
[im 4/27]
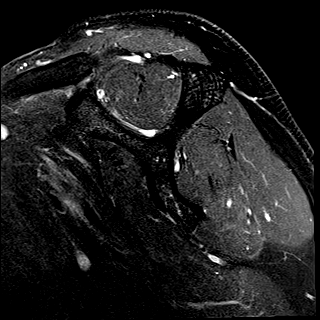
[im 15/27]
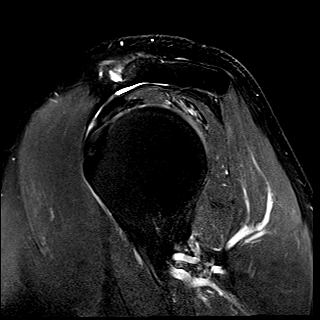
[im 23/27]
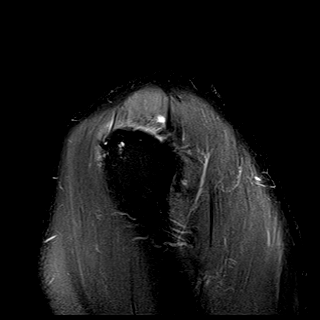

[Series 9: PD · oblique · right · 3.0mm · 0.18mm/px · 5 of 25 slices shown]
[im 1/25]
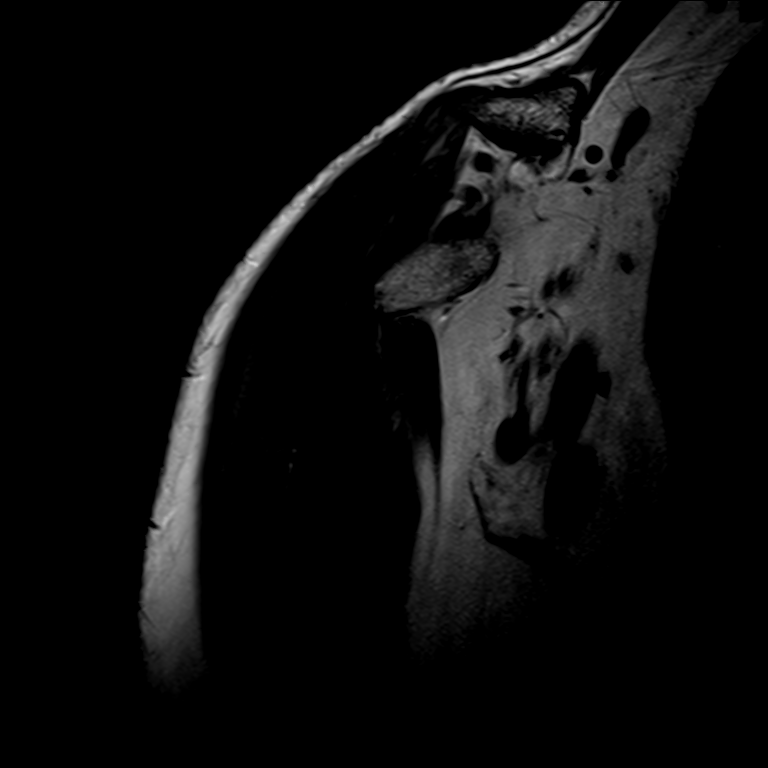
[im 5/25]
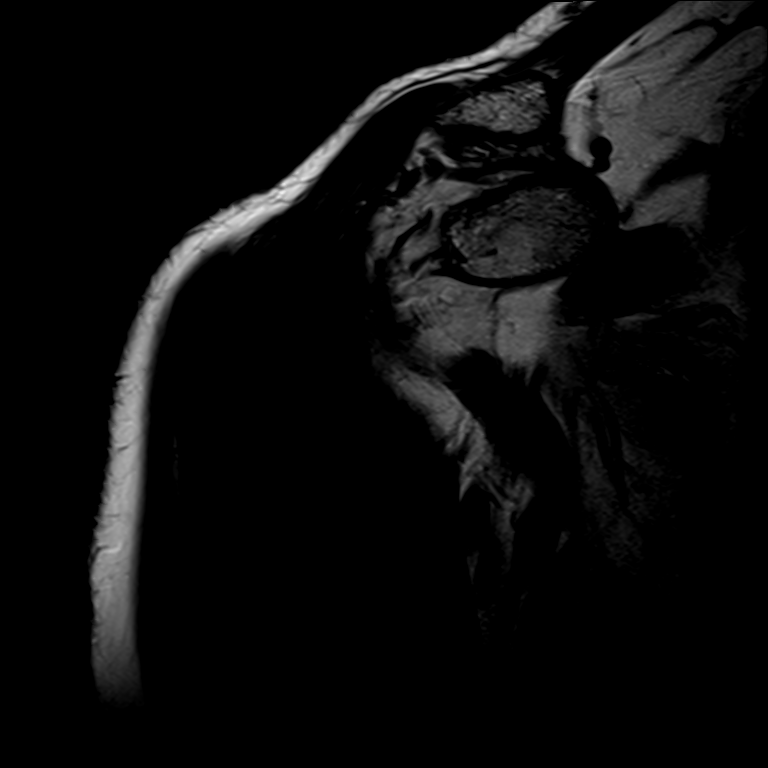
[im 9/25]
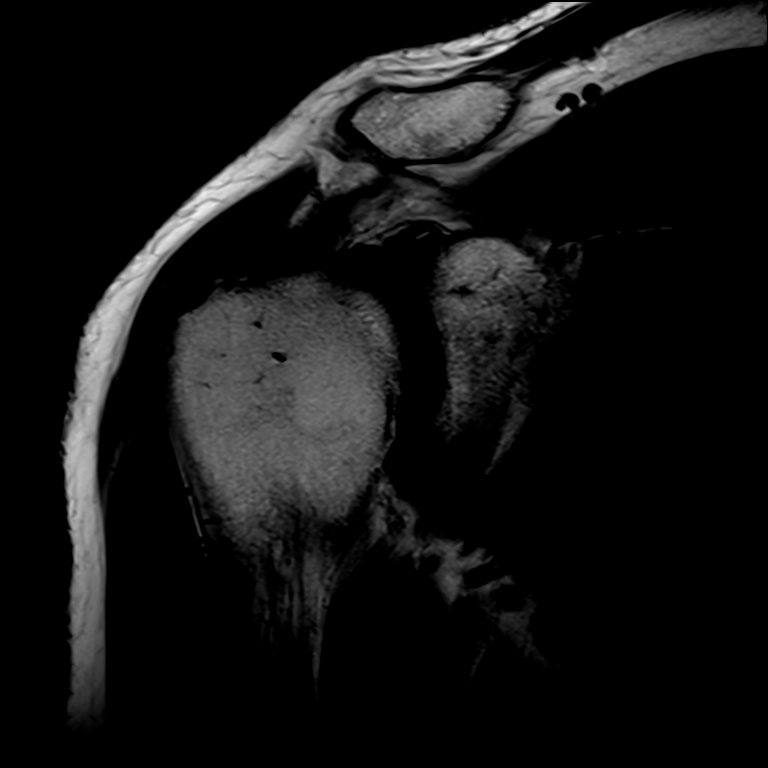
[im 13/25]
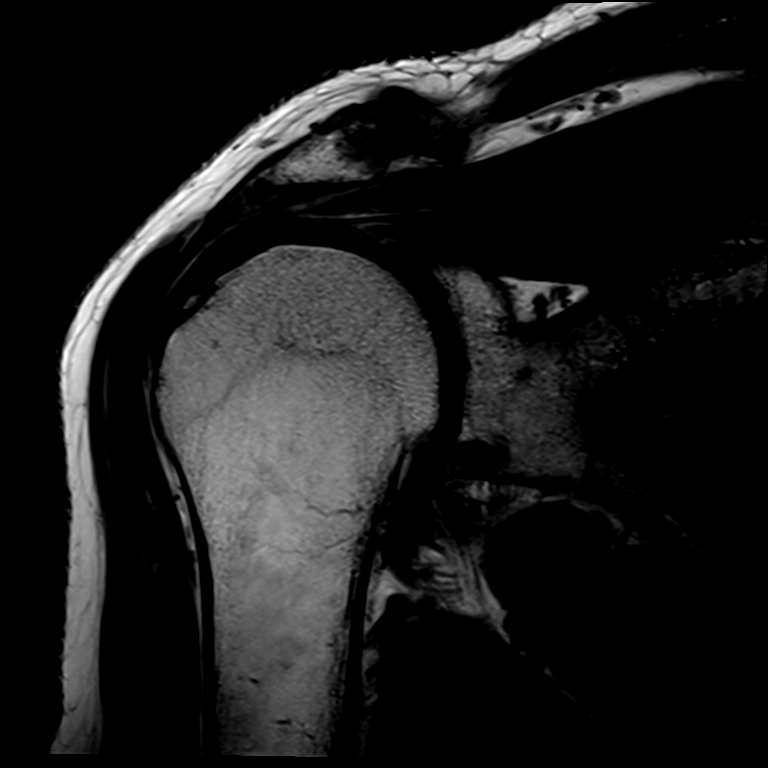
[im 21/25]
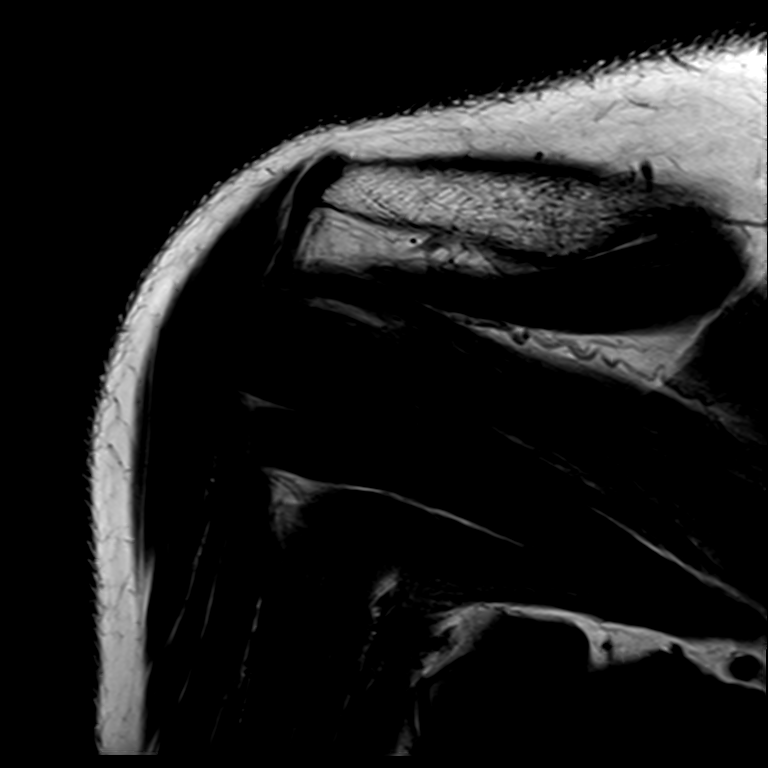

[Series 10: T2 fat-sat · oblique · right · 3.0mm · 0.22mm/px · 3 of 25 slices shown (3 of 3)]
[im 5/25]
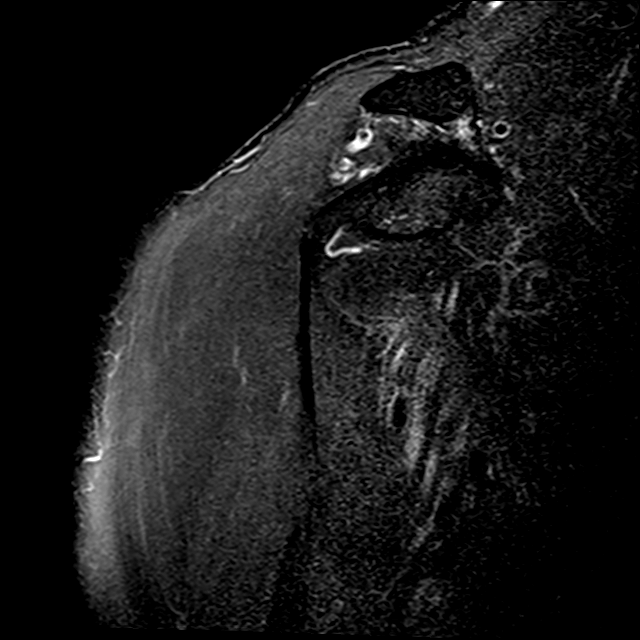
[im 13/25]
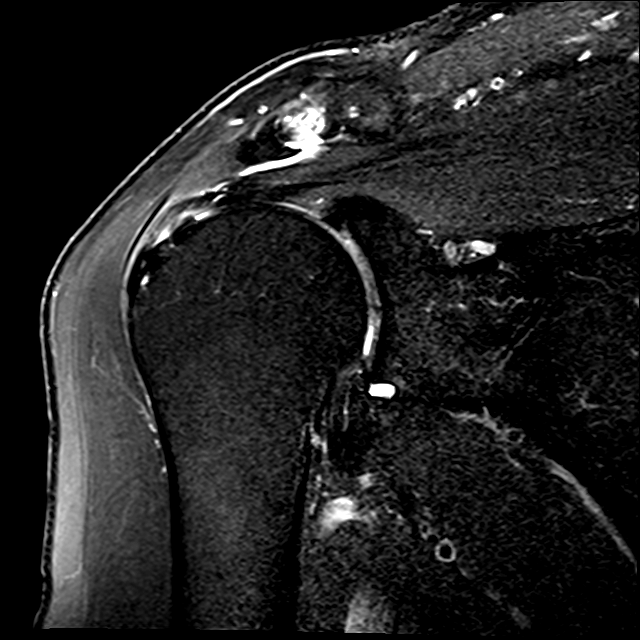
[im 21/25]
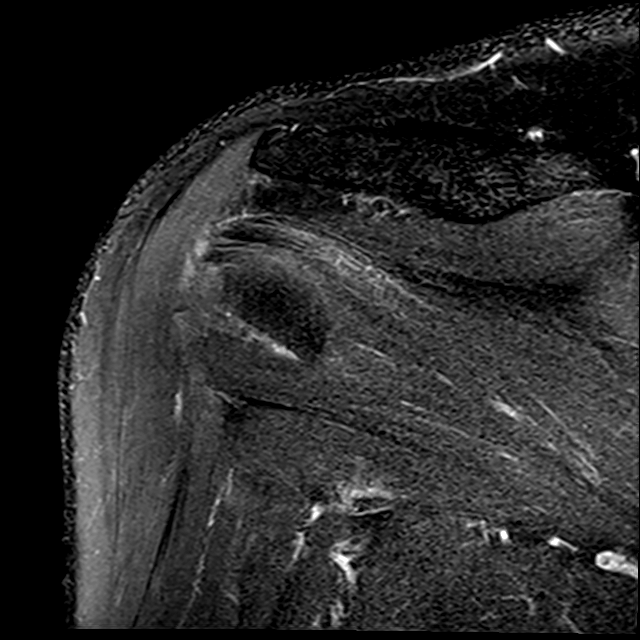

[14 of 40 positions shown; findings below may reference images not displayed]

FINDINGS: Rotator cuff: Severe tendinosis of the supraspinatus tendon with a
high-grade partial-thickness articular surface tear with a possible
small full-thickness component. Moderate tendinosis of the
infraspinatus tendon. Teres minor tendon is intact. Subscapularis
tendon is intact.

Muscles: No atrophy or fatty replacement of nor abnormal signal
within, the muscles of the rotator cuff.

Biceps long head: Mild tendinosis of the intraarticular portion of
the long head of the biceps tendon.

Acromioclavicular Joint: Mild arthropathy of the acromioclavicular
joint. Type I acromion. No subacromial/subdeltoid bursal fluid.

Glenohumeral Joint: No significant joint effusion. Partial-thickness
cartilage loss of the glenohumeral joint. Subchondral cyst in the
posterior inferior glenoid.

Labrum: Limited evaluation secondary to lack of intra-articular
contrast. Attenuation of the superior posterior labrum most
consistent with a degenerative tear. Small posterior labral tear.

Bones:  No marrow signal abnormality.  No fracture or dislocation.

Other: No fluid collection or hematoma.
IMPRESSION: 1. Severe tendinosis of the supraspinatus tendon with a high-grade
partial-thickness articular surface tear with a possible small
full-thickness component.
2. Moderate tendinosis of the infraspinatus tendon.
3. Mild tendinosis of the intraarticular portion of the long head of
the biceps tendon.

## 2018-09-27 ENCOUNTER — Other Ambulatory Visit: Payer: Medicare Other

## 2018-09-27 ENCOUNTER — Ambulatory Visit
Admission: RE | Admit: 2018-09-27 | Discharge: 2018-09-27 | Disposition: A | Discharge status: Home / Self Care | Payer: Medicare Other | Source: Ambulatory Visit | Attending: Orthopaedic Surgery | Admitting: Orthopaedic Surgery

## 2018-09-27 DIAGNOSIS — M25551 Pain in right hip: Secondary | ICD-10-CM | POA: Diagnosis not present

## 2018-09-27 MED ORDER — IOPAMIDOL (ISOVUE-M 200) INJECTION 41%
1.0000 mL | Freq: Once | INTRAMUSCULAR | Status: AC
  Administered 2018-09-27: 1 mL via INTRA_ARTICULAR

## 2018-09-27 MED ORDER — METHYLPREDNISOLONE ACETATE 40 MG/ML INJ SUSP (RADIOLOG
120.0000 mg | Freq: Once | INTRAMUSCULAR | Status: AC
  Administered 2018-09-27: 120 mg via INTRA_ARTICULAR

## 2019-01-18 DIAGNOSIS — H40012 Open angle with borderline findings, low risk, left eye: Secondary | ICD-10-CM | POA: Diagnosis not present

## 2019-02-08 ENCOUNTER — Telehealth: Payer: Self-pay

## 2019-02-08 DIAGNOSIS — R0602 Shortness of breath: Secondary | ICD-10-CM

## 2019-02-08 DIAGNOSIS — R079 Chest pain, unspecified: Secondary | ICD-10-CM

## 2019-02-08 NOTE — Telephone Encounter (Signed)
OK to refer.  We should offer sooner follow up here is needed.  Don't know from note how long symptoms have gone on- but he should be seen promptly for this.

## 2019-02-08 NOTE — Telephone Encounter (Signed)
Please see message. °

## 2019-02-08 NOTE — Telephone Encounter (Signed)
Copied from [place] [id number]. Topic: Referral - Request for Referral >> Feb 08, 2019  8:37 AM Erick Blinks wrote: Has patient seen PCP for this complaint?Yes  *If NO, is insurance requiring patient see PCP for this issue before PCP can refer them? Referral for which specialty: Cardiology  Preferred provider/office: Dr. Debara Pickett  Reason for referral: Pain in chest, shortness of breath when pt is walking on an incline.   Best Contact: [phone]

## 2019-02-08 NOTE — Telephone Encounter (Signed)
Patient has an appointment on Monday at 10:30am. Referral has been placed. Called patient and he is aware and will call our number when he arrives on Monday.

## 2019-02-11 ENCOUNTER — Other Ambulatory Visit: Payer: Self-pay

## 2019-02-11 ENCOUNTER — Ambulatory Visit (INDEPENDENT_AMBULATORY_CARE_PROVIDER_SITE_OTHER): Payer: Medicare Other | Admitting: Family Medicine

## 2019-02-11 ENCOUNTER — Encounter: Payer: Self-pay | Admitting: Family Medicine

## 2019-02-11 VITALS — BP 118/74 | HR 59 | Temp 98.0°F | Ht 72.0 in | Wt 188.9 lb

## 2019-02-11 DIAGNOSIS — R079 Chest pain, unspecified: Secondary | ICD-10-CM

## 2019-02-11 MED ORDER — NITROGLYCERIN 0.4 MG SL SUBL: 0.4000 mg | SUBLINGUAL_TABLET | SUBLINGUAL | 0 refills | Status: DC | PRN

## 2019-02-11 NOTE — Progress Notes (Addendum)
Subjective:     Patient ID: JHOAN SCHMIEDER, male   DOB: [birth year], 73 y.o.   MRN: [id number]  HPI   Patient is seen with intermittent episodes of some chest tightness and shortness of breath with exertion since around mid March.  His chronic problems include history of hypertension, hyperlipidemia, hyperglycemia.  Quit smoking back in early 70s.  No known history of CAD.  He states he had regular treadmill stress test by cardiologist who is now retired (Dr Rex Kras) over 10 years ago.  He relates around mid March he was walking with his wife and they started up an incline and he had some dyspnea and substernal chest pressure which lasted about 5 minutes.  After they got to level ground his symptoms improved.  He has noticed a couple similar episodes recently when walking his dog where he was going up an incline and had transient chest tightness.  He then was walking from his house to a car dealership last week around Thursday when he had worst episode yet which again lasted about 5 minutes with substernal chest tightness/pressure that radiated to the neck bilaterally.  No nausea.  No diaphoresis.  Currently asymptomatic.  Patient has hypertension which has been well controlled.  He has hyperlipidemia and has been intolerant of multiple statins in the past.  His mother had "congestive heart failure ".  He is unsure of etiology.  He also states she had history of carotid stenosis.  He has a sister with stroke history.  Past Medical History:  Diagnosis Date  . GERD (gastroesophageal reflux disease)   . History of hiatal hernia    40 yrs ago  . HYPERLIPIDEMIA 01/26/2009  . HYPERTENSION 01/26/2009  . 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 lower back pain, s/p cervical neck surgery"stiffness" remains  . Transfusion history    infant "anemia"   Past Surgical History:  Procedure Laterality Date  . APPENDECTOMY  ~ 1959  . BACK SURGERY    . CERVICAL DISC ARTHROPLASTY N/A  07/30/2018   Procedure: Cervical five-six Cervical six-seven Artificial disc replacement;  Surgeon: Kristeen Miss, MD;  Location: [facility];  Service: Neurosurgery;  Laterality: N/A;  . CERVICAL LAMINECTOMY  1997   C 4 and C 5  . CHOLECYSTECTOMY N/A 04/15/2014   Procedure: LAPAROSCOPIC CHOLECYSTECTOMY WITH INTRAOPERATIVE CHOLANGIOGRAM;  Surgeon: Armandina Gemma, MD;  Location: [facility];  Service: General;  Laterality: N/A;  . COLONOSCOPY    . ESOPHAGOGASTRODUODENOSCOPY N/A 03/13/2014   Procedure: ESOPHAGOGASTRODUODENOSCOPY (EGD);  Surgeon: Wonda Horner, MD;  Location: [facility] END[facility]COPY;  Service: Endoscopy;  Laterality: N/A;  . 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  . SHOULDER ARTHROSCOPY Bilateral   . SHOULDER ARTHROSCOPY W/ ROTATOR CUFF REPAIR Bilateral 2004-2009   left 2004, right 2009  . VASECTOMY    . WISDOM TOOTH EXTRACTION      reports that he quit smoking about 50 years ago. His smoking use included cigarettes. He has a 5.00 pack-year smoking history. He has never used smokeless tobacco. He reports previous alcohol use. He reports that he does not use drugs. family history includes Arthritis in his sister; Hypertension in his father and mother; Stroke (age of onset: 30) in his sister. Allergies  Allergen Reactions  . Penicillins Itching    Has patient had a PCN reaction causing immediate rash, facial/tongue/throat swelling, SOB or lightheadedness with hypotension: no Has patient had a PCN reaction causing severe rash involving mucus membranes or skin necrosis: unkn Has patient  had a PCN reaction that required hospitalization: no Has patient had a PCN reaction occurring within the last 10 years: no If all of the above answers are "NO", then may proceed with Cephalosporin use.      Review of Systems  Constitutional: Negative for appetite change, fever and unexpected weight change.  Respiratory: Positive for shortness of breath. Negative for cough and wheezing.    Cardiovascular: Positive for chest pain. Negative for palpitations and leg swelling.  Gastrointestinal: Negative for abdominal pain.  Neurological: Negative for dizziness and syncope.  Psychiatric/Behavioral: Negative for confusion.       Objective:   Physical Exam Constitutional:      Appearance: He is well-developed.  Neck:     Comments: No carotid bruits Cardiovascular:     Heart sounds: Normal heart sounds.  Pulmonary:     Effort: Pulmonary effort is normal.  Neurological:     Mental Status: He is alert.        Assessment:     Patient relates 82-month history of recurrent episodes of chest pressure and dyspnea with exertion.  Symptoms are concerning for exertional angina    Plan:     -Check EKG-sinus bradycardia with no acute changes -Set up prompt cardiology referral for further evaluation -Recommend start aspirin 81 mg daily -We also wrote for sublingual nitroglycerin to use as needed -Avoid heavy exertion until further evaluated -Follow-up immediately for any progressive chest pain or other concerns -previous intolerance to multiple statins.  Eulas Post MD [facility]

## 2019-02-11 NOTE — Patient Instructions (Signed)
We are setting up Cardiology referral.    Start Aspirin 81 mg daily  Avoid heavy exertion until further evaluated.

## 2019-02-14 ENCOUNTER — Other Ambulatory Visit: Payer: Self-pay

## 2019-02-14 ENCOUNTER — Encounter: Payer: Self-pay | Admitting: Cardiology

## 2019-02-14 ENCOUNTER — Ambulatory Visit (INDEPENDENT_AMBULATORY_CARE_PROVIDER_SITE_OTHER): Payer: Medicare Other | Admitting: Cardiology

## 2019-02-14 VITALS — BP 130/78 | HR 51 | Temp 97.3°F | Ht 72.0 in | Wt 189.0 lb

## 2019-02-14 DIAGNOSIS — R739 Hyperglycemia, unspecified: Secondary | ICD-10-CM | POA: Diagnosis not present

## 2019-02-14 DIAGNOSIS — E785 Hyperlipidemia, unspecified: Secondary | ICD-10-CM

## 2019-02-14 DIAGNOSIS — Z01818 Encounter for other preprocedural examination: Secondary | ICD-10-CM | POA: Diagnosis not present

## 2019-02-14 DIAGNOSIS — I10 Essential (primary) hypertension: Secondary | ICD-10-CM

## 2019-02-14 DIAGNOSIS — I714 Abdominal aortic aneurysm, without rupture, unspecified: Secondary | ICD-10-CM

## 2019-02-14 DIAGNOSIS — I2089 Other forms of angina pectoris: Secondary | ICD-10-CM | POA: Insufficient documentation

## 2019-02-14 DIAGNOSIS — I209 Angina pectoris, unspecified: Secondary | ICD-10-CM | POA: Diagnosis not present

## 2019-02-14 MED ORDER — METOPROLOL SUCCINATE ER 50 MG PO TB24: ORAL_TABLET | ORAL | 3 refills | Status: DC

## 2019-02-14 NOTE — Progress Notes (Deleted)
PCP: Eulas Post, MD  Clinic Note: No chief complaint on file.   ZCH:YIFOY Luis Guzman is a 73 y.o. male who is being seen today for the evaluation of chest discomfort and shortness of breath at the request of Eulas Post, MD.  FITZ MATSUO was just seen on June 29 by Dr. Elease Hashimoto noting exertional dyspnea and chest tightness since March.  (Previously seen by Dr. Rex Kras with negative stress test 10 years ago) --> 1 particular episode was in March walking with his wife starting to walk up an incline noted some substernal chest pressure and dyspnea lasting roughly 5 minutes.  Improved once again on a level ground.  Has noted several recurrent episodes out walking his dog, most notably with going up an incline having transient chest tightness.  Worst episode was shopping at a car dealership, he noted 5 minutes of substernal chest pain radiating to the neck.  Recent Hospitalizations: ***  Studies Personally Reviewed - (if available, images/films reviewed: From Epic Chart or Care Everywhere)  ***  Interval History: ***   No chest pain or shortness of breath with rest or exertion. No PND, orthopnea or edema. No palpitations, lightheadedness, dizziness, weakness or syncope/near syncope. No TIA/amaurosis fugax symptoms. No melena, hematochezia, hematuria, or epstaxis. No claudication.  ROS: A comprehensive was performed. ROS   I have reviewed and (if needed) personally updated the patient's problem list, medications, allergies, past medical and surgical history, social and family history.   Past Medical History:  Diagnosis Date  . GERD (gastroesophageal reflux disease)   . History of hiatal hernia    40 yrs ago  . HYPERLIPIDEMIA 01/26/2009  . HYPERTENSION 01/26/2009  . 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 lower back pain, s/p cervical neck surgery"stiffness" remains  . Transfusion history    infant "anemia"    Past Surgical History:  Procedure  Laterality Date  . APPENDECTOMY  ~ 1959  . BACK SURGERY    . CERVICAL DISC ARTHROPLASTY N/A 07/30/2018   Procedure: Cervical five-six Cervical six-seven Artificial disc replacement;  Surgeon: Kristeen Miss, MD;  Location: [place];  Service: Neurosurgery;  Laterality: N/A;  . CERVICAL LAMINECTOMY  1997   C 4 and C 5  . CHOLECYSTECTOMY N/A 04/15/2014   Procedure: LAPAROSCOPIC CHOLECYSTECTOMY WITH INTRAOPERATIVE CHOLANGIOGRAM;  Surgeon: Armandina Gemma, MD;  Location: [facility];  Service: General;  Laterality: N/A;  . COLONOSCOPY    . ESOPHAGOGASTRODUODENOSCOPY N/A 03/13/2014   Procedure: ESOPHAGOGASTRODUODENOSCOPY (EGD);  Surgeon: Wonda Horner, MD;  Location: [facility] ENDOSCOPY;  Service: Endoscopy;  Laterality: N/A;  . 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  . SHOULDER ARTHROSCOPY Bilateral   . SHOULDER ARTHROSCOPY W/ ROTATOR CUFF REPAIR Bilateral 2004-2009   left 2004, right 2009  . VASECTOMY    . WISDOM TOOTH EXTRACTION      No outpatient medications have been marked as taking for the 02/14/19 encounter (Appointment) with Leonie Man, MD.    Allergies  Allergen Reactions  . Penicillins Itching    Has patient had a PCN reaction causing immediate rash, facial/tongue/throat swelling, SOB or lightheadedness with hypotension: no Has patient had a PCN reaction causing severe rash involving mucus membranes or skin necrosis: unkn Has patient had a PCN reaction that required hospitalization: no Has patient had a PCN reaction occurring within the last 10 years: no If all of the above answers are "NO", then may proceed with Cephalosporin use.     Social History  Tobacco Use  . Smoking status: Former Smoker    Packs/day: 1.00    Years: 5.00    Pack years: 5.00    Types: Cigarettes    Quit date: 10/17/1968    Years since quitting: 50.3  . Smokeless tobacco: Never Used  Substance Use Topics  . Alcohol use: Not Currently    Comment: "quit drinking ~ 10/1968  . Drug use: No    Social History   Social History Narrative  . Not on file    family history includes Arthritis in his sister; Hypertension in his father and mother; Stroke (age of onset: 55) in his sister.  Wt Readings from Last 3 Encounters:  02/11/19 188 lb 14.4 oz (85.7 kg)  07/30/18 181 lb (82.1 kg)  07/23/18 181 lb (82.1 kg)    PHYSICAL EXAM There were no vitals taken for this visit. Physical Exam   Adult ECG Report  Rate: *** ;  Rhythm: {rhythm:17366};   Narrative Interpretation: ***   Other studies Reviewed: Additional studies/ records that were reviewed today include:  Recent Labs:  ***     ASSESSMENT / PLAN: Problem List Items Addressed This Visit    None       I spent a total of ***minutes with the patient and chart review. >  50% of the time was spent in direct patient consultation.   Current medicines are reviewed at length with the patient today.  (+/- concerns) *** The following changes have been made:  ***  There are no Patient Instructions on file for this visit.   Studies Ordered:   No orders of the defined types were placed in this encounter.     Glenetta Hew, M.D., M.S. Interventional Cardiologist   Pager # [phone] Phone # [phone] [address] [place], [place] [address]   Thank you for choosing [facility] at [facility]!!

## 2019-02-14 NOTE — H&P (View-Only) (Signed)
PCP: Luis Post, MD  Clinic Note: Chief Complaint  Patient presents with   New Patient (Initial Visit)   Chest Pain    Tightness.   Shortness of Breath    HPI: Luis Guzman is a 73 y.o. male with past medical history of hypertension, hyperlipidemia as well as borderline hyperglycemia who is being seen today for the evaluation of chest pain with high risk for cardiac etiology, most likely exertional angina at the request of Luis Post, MD.  Luis Guzman was just seen on June 29 by Dr. Elease Hashimoto with complaints of intermittent chest discomfort and shortness of breath with exertion starting in March.  Recent Hospitalizations: None  Studies Personally Reviewed - (if available, images/films reviewed: From Epic Chart or Care Everywhere)  None  Interval History: Luis Guzman is here today stating that basically from early March to now he has been noticing more exertional shortness of breath, and if he pushes a little further he will start having some chest tightness.  What he mostly notes is that if he is walking up an incline, or up a flight of steps, he will definitely notice the shortness of breath come on much faster than if he walks on a flat.  He had one particular episode that lasted about 5 minutes of pretty significant chest pressure that relieved with rest.  He was walking his dogs, and when he went was going up the hill, he started having short of breath with the chest tightness that then made him stop.  After a few minutes he is able to walk back down the hill and do fine without significant symptoms.  Since the first episode of the discomfort which is mild initially and more associate with shortness of breath, he has been noticing increasing episodes of exertional shortness of breath and chest tightness each time usually associated with walking uphill.  It is happening with slightly increased intensity and frequency.  At least 2 or 3 episodes of 5 minutes of chest pain, 1  walking his dog, 1 walking around his house up from his workshop, and then 1 while walking around a car dealership. He also notes having a dry cough for the last couple months.  Otherwise, he denies any resting chest pain or pressure, no heart failure symptoms of PND, orthopnea or edema. No palpitations, lightheadedness, dizziness, weakness or syncope/near syncope. No TIA/amaurosis fugax symptoms.  ROS: A comprehensive was performed. Review of Systems  Constitutional: Negative for malaise/fatigue and weight loss.  HENT: Negative for congestion and nosebleeds.   Respiratory: Positive for cough (Dry hacking cough over the last couple months).   Cardiovascular: Negative for claudication.  Gastrointestinal: Positive for diarrhea and nausea. Negative for blood in stool and melena.       -- long standing Nausea & diarrhea (despite cholecystectomy); Nausea worse x last month  Genitourinary: Negative for dysuria, frequency and hematuria.  Musculoskeletal: Negative.   Neurological: Negative for dizziness, tingling, focal weakness and headaches.  Psychiatric/Behavioral: Negative.    The patient does not have symptoms concerning for COVID-19 infection (fever, chills, cough, or new shortness of breath).  The patient is practicing social distancing.   COVID-19 Education: The signs and symptoms of COVID-19 were discussed with the patient and how to seek care for testing (follow up with PCP or arrange E-visit).   The importance of social distancing was discussed today.  I have reviewed and (if needed) personally updated the patient's problem list, medications, allergies, past medical and surgical  history, social and family history.   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 lower back pain, s/p  cervical neck surgery"stiffness" remains   Transfusion history    infant "anemia"    Past Surgical History:  Procedure Laterality Date   APPENDECTOMY  ~ [place] N/A 07/30/2018   Procedure: Cervical five-six Cervical six-seven Artificial disc replacement;  Surgeon: Kristeen Miss, MD;  Location: [facility];  Service: Neurosurgery;  Laterality: N/A;   CERVICAL LAMINECTOMY  1997   C 4 and C 5   CHOLECYSTECTOMY N/A 04/15/2014   Procedure: LAPAROSCOPIC CHOLECYSTECTOMY WITH INTRAOPERATIVE CHOLANGIOGRAM;  Surgeon: Armandina Gemma, MD;  Location: [facility];  Service: General;  Laterality: N/A;   COLONOSCOPY     ESOPHAGOGASTRODUODENOSCOPY N/A 03/13/2014   Procedure: ESOPHAGOGASTRODUODENOSCOPY (EGD);  Surgeon: Wonda Horner, MD;  Location: [facility];  Service: Endoscopy;  Laterality: N/A;   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   SHOULDER ARTHROSCOPY Bilateral    SHOULDER ARTHROSCOPY W/ ROTATOR CUFF REPAIR Bilateral 2004-2009   left 2004, right 2009   VASECTOMY     WISDOM TOOTH EXTRACTION      Current Meds  Medication Sig   amLODipine (NORVASC) 2.5 MG tablet Take 1 tablet (2.5 mg total) by mouth daily.   baclofen (LIORESAL) 10 MG tablet Take 10 mg by mouth daily as needed for muscle spasms.   bimatoprost (LUMIGAN) 0.03 % ophthalmic solution Place 1 drop into both eyes at bedtime.   diazepam (VALIUM) 5 MG tablet Take 1 tablet (5 mg total) by mouth every 8 (eight) hours as needed for muscle spasms.   diclofenac sodium (VOLTAREN) 1 % GEL Apply 2 g topically 4 (four) times daily. (Patient taking differently: Apply 2 g topically 2 (two) times daily as needed (pain). )   hydrochlorothiazide (MICROZIDE) 12.5 MG capsule Take 1 capsule (12.5 mg total) by mouth daily.   metoprolol succinate (TOPROL-XL) 50 MG 24 hr tablet TAKE 1/2 TABLET OF 50 MG  BY MOUTH  DAILY WITH OR IMMEDIATLEY  FOLLOWING A MEAL   nitroGLYCERIN (NITROSTAT) 0.4  MG SL tablet Place 1 tablet (0.4 mg total) under the tongue every 5 (five) minutes as needed for chest pain.   valsartan (DIOVAN) 160 MG tablet Take 1 tablet (160 mg total) by mouth daily.   [DISCONTINUED] metoprolol succinate (TOPROL-XL) 50 MG 24 hr tablet TAKE 1 TABLET BY MOUTH  DAILY WITH OR IMMEDIATLEY  FOLLOWING A MEAL    Allergies  Allergen Reactions   Penicillins Itching    Has patient had a PCN reaction causing immediate rash, facial/tongue/throat swelling, SOB or lightheadedness with hypotension: no Has patient had a PCN reaction causing severe rash involving mucus membranes or skin necrosis: unkn Has patient had a PCN reaction that required hospitalization: no Has patient had a PCN reaction occurring within the last 10 years: no If all of the above answers are "NO", then may proceed with Cephalosporin use.     Social History   Tobacco Use   Smoking status: Former Smoker    Packs/day: 1.00    Years: 5.00    Pack years: 5.00    Types: Cigarettes    Quit date: 10/17/1968    Years since quitting: 50.3  Smokeless tobacco: Never Used  Substance Use Topics   Alcohol use: Not Currently    Comment: "quit drinking ~ 10/1968   Drug use: No   Social History   Social History Narrative   Not on file    family history includes Arthritis in his sister; Hypertension in his father and mother; Stroke (age of onset: 50) in his sister.  Wt Readings from Last 3 Encounters:  02/14/19 189 lb (85.7 kg)  02/11/19 188 lb 14.4 oz (85.7 kg)  07/30/18 181 lb (82.1 kg)    PHYSICAL EXAM BP 130/78 (BP Location: Left Arm, Patient Position: Sitting, Cuff Size: Normal)    Pulse (!) 51    Temp (!) 97.3 F (36.3 C)    Ht 6' (1.829 m)    Wt 189 lb (85.7 kg)    BMI 25.63 kg/m  Physical Exam  Constitutional: He is oriented to person, place, and time. He appears well-developed and well-nourished. No distress.  Healthy appearing gentleman.  Well-groomed.  HENT:  Head: Normocephalic and  atraumatic.  Eyes: Pupils are equal, round, and reactive to light. Conjunctivae are normal. No scleral icterus.  Neck: Normal range of motion. Neck supple. No hepatojugular reflux and no JVD present. Carotid bruit is not present.  Cardiovascular: Regular rhythm, normal heart sounds, intact distal pulses and normal pulses.  No extrasystoles are present. Bradycardia present. PMI is not displaced. Exam reveals no gallop and no friction rub.  No murmur heard. Pulmonary/Chest: Effort normal and breath sounds normal. No respiratory distress. He has no wheezes.  Abdominal: Soft. Bowel sounds are normal. He exhibits no distension. There is no abdominal tenderness. There is no rebound.  Musculoskeletal: Normal range of motion.        General: No edema.  Neurological: He is alert and oriented to person, place, and time. No cranial nerve deficit.  Skin: Skin is warm and dry. No rash noted. No erythema.  Psychiatric: He has a normal mood and affect. His behavior is normal. Judgment and thought content normal.  Vitals reviewed.    Adult ECG Report  Rate: 51 ;  Rhythm: sinus bradycardia; normal axis, intervals and durations.  Narrative Interpretation: Only sinus bradycardia.   Other studies Reviewed: Additional studies/ records that were reviewed today include:  Recent Labs:   Lab Results  Component Value Date   CHOL 239 (H) 05/02/2018   HDL 42.80 05/02/2018   LDLCALC 176 (H) 05/02/2018   LDLDIRECT 129.0 08/19/2016   TRIG 103.0 05/02/2018   CHOLHDL 6 05/02/2018   Lab Results  Component Value Date   CREATININE 0.90 02/14/2019   BUN 13 02/14/2019   NA 143 02/14/2019   K 3.8 02/14/2019   CL 102 02/14/2019   CO2 25 02/14/2019   Lab Results  Component Value Date   WBC 11.4 (H) 02/14/2019   HGB 16.3 02/14/2019   HCT 49.2 02/14/2019   MCV 91 02/14/2019   PLT 240 02/14/2019   Lab Results  Component Value Date   HGBA1C 6.0 05/02/2018    ASSESSMENT / PLAN: Problem List Items Addressed  This Visit    Hyperlipidemia (Chronic)    Has not had labs checked since last year, but LDL calculated last year was 176 with a total cholesterol 239.  Very poorly controlled.  Has been relatively reluctant to do take a statin.  Pending cath results, may want at least consider low-dose rosuvastatin and potential referral for CVR our evaluation to potentially start PCSK9 inhibitor if cath results warrant.  Relevant Medications   metoprolol succinate (TOPROL-XL) 50 MG 24 hr tablet   Hyperglycemia    Closely monitor glycemic control.  Low threshold to consider metformin.      Essential hypertension (Chronic)    Blood pressure looks pretty good today, but is on relatively low-dose amlodipine, HCTZ, Toprol and valsartan. Plan: Reduce Toprol to 25 mg and potentially hold HCTZ while we restart amlodipine 2.5 mg daily for additional antianginal benefit.      Relevant Medications   metoprolol succinate (TOPROL-XL) 50 MG 24 hr tablet   Angina, class III (HCC)    Increasing episodes of exertional dyspnea and chest pain concerning for progression from class II to class III angina. Based on the classic nature of his symptoms, I do not think that a stress test will change my clinical judgment that this is angina and therefore I think the best course of action is to proceed with cardiac catheterization and possible PCI.  Plan: Schedule cardiac catheterization for next possible date with me.  With bradycardia, I cannot titrate beta-blocker any further, in fact would like to reduce Toprol to 25 mg daily which will allow him to restart amlodipine 2.5 mg daily.  (In the past, he did not tolerate that increase with current dose of Toprol).  If blood pressure still is low, would hold HCTZ as opposed to reducing amlodipine.  Not currently on statin--we will need to check lipid panel and address accordingly based on cath results.  Performing MD:  Glenetta Hew, M.D., M.S.  Procedure: LEFT HEART  CATHETERIZATION WITH CORONARY ANGIOGRAPHY AND POSSIBLE PERCUTANEOUS CORONARY INTERVENTION  The procedure with Risks/Benefits/Alternatives and Indications was reviewed with the patient.  All questions were answered.    Risks / Complications include, but not limited to: Death, MI, CVA/TIA, VF/VT (with defibrillation), Bradycardia (need for temporary pacer placement), contrast induced nephropathy, bleeding / bruising / hematoma / pseudoaneurysm, vascular or coronary injury (with possible emergent CT or Vascular Surgery), adverse medication reactions, infection.  Additional risks involving the use of radiation with the possibility of radiation burns and cancer were explained in detail.  The patient voices understanding and agree to proceed.         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 >  50% of the time was spent in direct patient consultation.  Additional 10 minutes spent with chart review and charting.  Current medicines are reviewed at length with the patient today.  (+/- concerns) none The following changes have been made:  See below  Patient Instructions  Medication Instructions:  RESTART TAKING  AMLODIPINE  DAILY DO NOT TAKE HCTZ  DECREASE  TOPROL TO 1/2 TABLET DAILY   If you need a refill on your cardiac medications before your next appointment, please call your pharmacy.   Lab work: COVID test  Testing/Procedures: SCHEDULE AT [facility] -  July 14 ,2020    COVID TEST WILL BE FRIDAY July 10 , 2020 AT 2:05 PM - [facility].  Follow-Up:  You will need a follow up appointment in    1 MONTH .  Please call our office 2 months in advance to schedule this appointment.  You may see Glenetta Hew, MD or one of the following Advanced Practice Providers on your designated Care Team:    Rosaria Ferries, PA-C  Jory Sims, DNP, ANP  Any Other Special Instructions Will Be Listed Below (If Applicable)  Studies Ordered:   Orders Placed This Encounter  Procedures   Basic metabolic panel   CBC   EKG 12-Lead   LEFT HEART CATHETERIZATION WITH CORONARY Illene Silver, M.D., M.S. Interventional Cardiologist   Pager # [phone] Phone # [phone] [address] [place],  [address]   Thank you for choosing [facility] at [facility]!!

## 2019-02-14 NOTE — Progress Notes (Signed)
PCP: Eulas Post, MD  Clinic Note: Chief Complaint  Patient presents with  . New Patient (Initial Visit)  . Chest Pain    Tightness.  . Shortness of Breath    HPI: Luis Guzman is a 73 y.o. male with past medical history of hypertension, hyperlipidemia as well as borderline hyperglycemia who is being seen today for the evaluation of chest pain with high risk for cardiac etiology, most likely exertional angina at the request of Eulas Post, MD.  Luis Guzman was just seen on June 29 by Dr. Elease Hashimoto with complaints of intermittent chest discomfort and shortness of breath with exertion starting in March.  Recent Hospitalizations: None  Studies Personally Reviewed - (if available, images/films reviewed: From Epic Chart or Care Everywhere)  None  Interval History: Luis Guzman is here today stating that basically from early March to now he has been noticing more exertional shortness of breath, and if he pushes a little further he will start having some chest tightness.  What he mostly notes is that if he is walking up an incline, or up a flight of steps, he will definitely notice the shortness of breath come on much faster than if he walks on a flat.  He had one particular episode that lasted about 5 minutes of pretty significant chest pressure that relieved with rest.  He was walking his dogs, and when he went was going up the hill, he started having short of breath with the chest tightness that then made him stop.  After a few minutes he is able to walk back down the hill and do fine without significant symptoms.  Since the first episode of the discomfort which is mild initially and more associate with shortness of breath, he has been noticing increasing episodes of exertional shortness of breath and chest tightness each time usually associated with walking uphill.  It is happening with slightly increased intensity and frequency.  At least 2 or 3 episodes of 5 minutes of chest pain, 1  walking his dog, 1 walking around his house up from his workshop, and then 1 while walking around a car dealership. He also notes having a dry cough for the last couple months.  Otherwise, he denies any resting chest pain or pressure, no heart failure symptoms of PND, orthopnea or edema. No palpitations, lightheadedness, dizziness, weakness or syncope/near syncope. No TIA/amaurosis fugax symptoms.  ROS: A comprehensive was performed. Review of Systems  Constitutional: Negative for malaise/fatigue and weight loss.  HENT: Negative for congestion and nosebleeds.   Respiratory: Positive for cough (Dry hacking cough over the last couple months).   Cardiovascular: Negative for claudication.  Gastrointestinal: Positive for diarrhea and nausea. Negative for blood in stool and melena.       -- long standing Nausea & diarrhea (despite cholecystectomy); Nausea worse x last month  Genitourinary: Negative for dysuria, frequency and hematuria.  Musculoskeletal: Negative.   Neurological: Negative for dizziness, tingling, focal weakness and headaches.  Psychiatric/Behavioral: Negative.    The patient does not have symptoms concerning for COVID-19 infection (fever, chills, cough, or new shortness of breath).  The patient is practicing social distancing.   COVID-19 Education: The signs and symptoms of COVID-19 were discussed with the patient and how to seek care for testing (follow up with PCP or arrange E-visit).   The importance of social distancing was discussed today.  I have reviewed and (if needed) personally updated the patient's problem list, medications, allergies, past medical and surgical  history, social and family history.   Past Medical History:  Diagnosis Date  . GERD (gastroesophageal reflux disease)   . History of hiatal hernia    40 yrs ago  . HYPERLIPIDEMIA 01/26/2009  . HYPERTENSION 01/26/2009  . 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 lower back pain, s/p  cervical neck surgery"stiffness" remains  . Transfusion history    infant "anemia"    Past Surgical History:  Procedure Laterality Date  . APPENDECTOMY  ~ 1959  . BACK SURGERY    . CERVICAL DISC ARTHROPLASTY N/A 07/30/2018   Procedure: Cervical five-six Cervical six-seven Artificial disc replacement;  Surgeon: Kristeen Miss, MD;  Location: [facility];  Service: Neurosurgery;  Laterality: N/A;  . CERVICAL LAMINECTOMY  1997   C 4 and C 5  . CHOLECYSTECTOMY N/A 04/15/2014   Procedure: LAPAROSCOPIC CHOLECYSTECTOMY WITH INTRAOPERATIVE CHOLANGIOGRAM;  Surgeon: Armandina Gemma, MD;  Location: [facility];  Service: General;  Laterality: N/A;  . COLONOSCOPY    . ESOPHAGOGASTRODUODENOSCOPY N/A 03/13/2014   Procedure: ESOPHAGOGASTRODUODENOSCOPY (EGD);  Surgeon: Wonda Horner, MD;  Location[facility];  Service: Endoscopy;  Laterality: N/A;  . 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  . SHOULDER ARTHROSCOPY Bilateral   . SHOULDER ARTHROSCOPY W/ ROTATOR CUFF REPAIR Bilateral 2004-2009   left 2004, right 2009  . VASECTOMY    . WISDOM TOOTH EXTRACTION      Current Meds  Medication Sig  . amLODipine (NORVASC) 2.5 MG tablet Take 1 tablet (2.5 mg total) by mouth daily.  . baclofen (LIORESAL) 10 MG tablet Take 10 mg by mouth daily as needed for muscle spasms.  . bimatoprost (LUMIGAN) 0.03 % ophthalmic solution Place 1 drop into both eyes at bedtime.  . diazepam (VALIUM) 5 MG tablet Take 1 tablet (5 mg total) by mouth every 8 (eight) hours as needed for muscle spasms.  . diclofenac sodium (VOLTAREN) 1 % GEL Apply 2 g topically 4 (four) times daily. (Patient taking differently: Apply 2 g topically 2 (two) times daily as needed (pain). )  . hydrochlorothiazide (MICROZIDE) 12.5 MG capsule Take 1 capsule (12.5 mg total) by mouth daily.  . metoprolol succinate (TOPROL-XL) 50 MG 24 hr tablet TAKE 1/2 TABLET OF 50 MG  BY MOUTH  DAILY WITH OR IMMEDIATLEY  FOLLOWING A MEAL  . nitroGLYCERIN (NITROSTAT) 0.4  MG SL tablet Place 1 tablet (0.4 mg total) under the tongue every 5 (five) minutes as needed for chest pain.  . valsartan (DIOVAN) 160 MG tablet Take 1 tablet (160 mg total) by mouth daily.  . [DISCONTINUED] metoprolol succinate (TOPROL-XL) 50 MG 24 hr tablet TAKE 1 TABLET BY MOUTH  DAILY WITH OR IMMEDIATLEY  FOLLOWING A MEAL    Allergies  Allergen Reactions  . Penicillins Itching    Has patient had a PCN reaction causing immediate rash, facial/tongue/throat swelling, SOB or lightheadedness with hypotension: no Has patient had a PCN reaction causing severe rash involving mucus membranes or skin necrosis: unkn Has patient had a PCN reaction that required hospitalization: no Has patient had a PCN reaction occurring within the last 10 years: no If all of the above answers are "NO", then may proceed with Cephalosporin use.     Social History   Tobacco Use  . Smoking status: Former Smoker    Packs/day: 1.00    Years: 5.00    Pack years: 5.00    Types: Cigarettes    Quit date: 10/17/1968    Years since quitting: 50.3  .  Smokeless tobacco: Never Used  Substance Use Topics  . Alcohol use: Not Currently    Comment: "quit drinking ~ 10/1968  . Drug use: No   Social History   Social History Narrative  . Not on file    family history includes Arthritis in his sister; Hypertension in his father and mother; Stroke (age of onset: 41) in his sister.  Wt Readings from Last 3 Encounters:  02/14/19 189 lb (85.7 kg)  02/11/19 188 lb 14.4 oz (85.7 kg)  07/30/18 181 lb (82.1 kg)    PHYSICAL EXAM BP 130/78 (BP Location: Left Arm, Patient Position: Sitting, Cuff Size: Normal)   Pulse (!) 51   Temp (!) 97.3 F (36.3 C)   Ht 6' (1.829 m)   Wt 189 lb (85.7 kg)   BMI 25.63 kg/m  Physical Exam  Constitutional: He is oriented to person, place, and time. He appears well-developed and well-nourished. No distress.  Healthy appearing gentleman.  Well-groomed.  HENT:  Head: Normocephalic and  atraumatic.  Eyes: Pupils are equal, round, and reactive to light. Conjunctivae are normal. No scleral icterus.  Neck: Normal range of motion. Neck supple. No hepatojugular reflux and no JVD present. Carotid bruit is not present.  Cardiovascular: Regular rhythm, normal heart sounds, intact distal pulses and normal pulses.  No extrasystoles are present. Bradycardia present. PMI is not displaced. Exam reveals no gallop and no friction rub.  No murmur heard. Pulmonary/Chest: Effort normal and breath sounds normal. No respiratory distress. He has no wheezes.  Abdominal: Soft. Bowel sounds are normal. He exhibits no distension. There is no abdominal tenderness. There is no rebound.  Musculoskeletal: Normal range of motion.        General: No edema.  Neurological: He is alert and oriented to person, place, and time. No cranial nerve deficit.  Skin: Skin is warm and dry. No rash noted. No erythema.  Psychiatric: He has a normal mood and affect. His behavior is normal. Judgment and thought content normal.  Vitals reviewed.    Adult ECG Report  Rate: 51 ;  Rhythm: sinus bradycardia; normal axis, intervals and durations.  Narrative Interpretation: Only sinus bradycardia.   Other studies Reviewed: Additional studies/ records that were reviewed today include:  Recent Labs:   Lab Results  Component Value Date   CHOL 239 (H) 05/02/2018   HDL 42.80 05/02/2018   LDLCALC 176 (H) 05/02/2018   LDLDIRECT 129.0 08/19/2016   TRIG 103.0 05/02/2018   CHOLHDL 6 05/02/2018   Lab Results  Component Value Date   CREATININE 0.90 02/14/2019   BUN 13 02/14/2019   NA 143 02/14/2019   K 3.8 02/14/2019   CL 102 02/14/2019   CO2 25 02/14/2019   Lab Results  Component Value Date   WBC 11.4 (H) 02/14/2019   HGB 16.3 02/14/2019   HCT 49.2 02/14/2019   MCV 91 02/14/2019   PLT 240 02/14/2019   Lab Results  Component Value Date   HGBA1C 6.0 05/02/2018    ASSESSMENT / PLAN: Problem List Items Addressed  This Visit    Hyperlipidemia (Chronic)    Has not had labs checked since last year, but LDL calculated last year was 176 with a total cholesterol 239.  Very poorly controlled.  Has been relatively reluctant to do take a statin.  Pending cath results, may want at least consider low-dose rosuvastatin and potential referral for CVR our evaluation to potentially start PCSK9 inhibitor if cath results warrant.       Relevant  Medications   metoprolol succinate (TOPROL-XL) 50 MG 24 hr tablet   Hyperglycemia    Closely monitor glycemic control.  Low threshold to consider metformin.      Essential hypertension (Chronic)    Blood pressure looks pretty good today, but is on relatively low-dose amlodipine, HCTZ, Toprol and valsartan. Plan: Reduce Toprol to 25 mg and potentially hold HCTZ while we restart amlodipine 2.5 mg daily for additional antianginal benefit.      Relevant Medications   metoprolol succinate (TOPROL-XL) 50 MG 24 hr tablet   Angina, class III (HCC)    Increasing episodes of exertional dyspnea and chest pain concerning for progression from class II to class III angina. Based on the classic nature of his symptoms, I do not think that a stress test will change my clinical judgment that this is angina and therefore I think the best course of action is to proceed with cardiac catheterization and possible PCI.  Plan: Schedule cardiac catheterization for next possible date with me.  With bradycardia, I cannot titrate beta-blocker any further, in fact would like to reduce Toprol to 25 mg daily which will allow him to restart amlodipine 2.5 mg daily.  (In the past, he did not tolerate that increase with current dose of Toprol).  If blood pressure still is low, would hold HCTZ as opposed to reducing amlodipine.  Not currently on statin--we will need to check lipid panel and address accordingly based on cath results.  Performing MD:  Glenetta Hew, M.D., M.S.  Procedure: LEFT HEART  CATHETERIZATION WITH CORONARY ANGIOGRAPHY AND POSSIBLE PERCUTANEOUS CORONARY INTERVENTION  The procedure with Risks/Benefits/Alternatives and Indications was reviewed with the patient.  All questions were answered.    Risks / Complications include, but not limited to: Death, MI, CVA/TIA, VF/VT (with defibrillation), Bradycardia (need for temporary pacer placement), contrast induced nephropathy, bleeding / bruising / hematoma / pseudoaneurysm, vascular or coronary injury (with possible emergent CT or Vascular Surgery), adverse medication reactions, infection.  Additional risks involving the use of radiation with the possibility of radiation burns and cancer were explained in detail.  The patient voices understanding and agree to proceed.         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 >  50% of the time was spent in direct patient consultation.  Additional 10 minutes spent with chart review and charting.  Current medicines are reviewed at length with the patient today.  (+/- concerns) none The following changes have been made:  See below  Patient Instructions  Medication Instructions:  RESTART TAKING  AMLODIPINE  DAILY DO NOT TAKE HCTZ  DECREASE  TOPROL TO 1/2 TABLET DAILY   If you need a refill on your cardiac medications before your next appointment, please call your pharmacy.   Lab work: COVID test  Testing/Procedures: SCHEDULE AT [facility] -  July 14 ,2020    COVID TEST WILL BE FRIDAY July 10 , 2020 AT 2:05 PM - [facility].  Follow-Up: . You will need a follow up appointment in    1 MONTH .  Please call our office 2 months in advance to schedule this appointment.  You may see Glenetta Hew, MD or one of the following Advanced Practice Providers on your designated Care Team:   . Rosaria Ferries, PA-C . Jory Sims, DNP, ANP  Any Other Special Instructions Will Be Listed Below (If Applicable)  Studies Ordered:   Orders Placed This Encounter  Procedures  . Basic metabolic panel  . CBC  . EKG 12-Lead  . LEFT HEART CATHETERIZATION WITH CORONARY Illene Silver, M.D., M.S. Interventional Cardiologist   Pager # [phone] Phone # [phone] [address] [place], [place] [address]   Thank you for choosing Heartcare at [facility]!!

## 2019-02-14 NOTE — Patient Instructions (Addendum)
Medication Instructions:  RESTART TAKING  AMLODIPINE  DAILY DO NOT TAKE HCTZ  DECREASE  TOPROL TO 1/2 TABLET DAILY   If you need a refill on your cardiac medications before your next appointment, please call your pharmacy.   Lab work:  If you have labs (blood work) drawn today and your tests are completely normal, you will receive your results only by: [name] MyChart Message (if you have MyChart) OR . A paper copy in the mail If you have any lab test that is abnormal or we need to change your treatment, we will call you to review the results.  Testing/Procedures: SCHEDULE AT [facility] - July 14 ,2020  Your physician has requested that you have a cardiac catheterization. Cardiac catheterization is used to diagnose and/or treat various heart conditions. Doctors may recommend this procedure for a number of different reasons. The most common reason is to evaluate chest pain. Chest pain can be a symptom of coronary artery disease (CAD), and cardiac catheterization can show whether plaque is narrowing or blocking your heart's arteries. This procedure is also used to evaluate the valves, as well as measure the blood flow and oxygen levels in different parts of your heart. For further information please visit [email or web address]. Please follow instruction sheet, as given.  COVID TEST WILL BE FRIDAY July 10 , 2020 AT 2:05 PM - [facility].  Follow-Up: At [facility], you and your health needs are our priority.  As part of our continuing mission to provide you with exceptional heart care, we have created designated Provider Care Teams.  These Care Teams include your primary Cardiologist (physician) and Advanced Practice Providers (APPs -  Physician Assistants and Nurse Practitioners) who all work together to provide you with the care you need, when you need it. . You will need a follow up appointment in    1 MONTH .  Please call our office 2 months in  advance to schedule this appointment.  You may see Glenetta Hew, MD or one of the following Advanced Practice Providers on your designated Care Team:   . Rosaria Ferries, PA-C . Jory Sims, DNP, ANP  Any Other Special Instructions Will Be Listed Below (If Applicable)      [place] [address] Dept: [phone] Loc: [place]  02/14/2019  You are scheduled for a Cardiac Catheterization on Tuesday, July 14 with Dr. Glenetta Hew.  1. Please arrive at the [facility] (Main Entrance A) at [facility]: [address] [place], [place] [address] at 5:30 AM (This time is two hours before your procedure to ensure your preparation). Free valet parking service is available.   Special note: Every effort is made to have your procedure done on time. Please understand that emergencies sometimes delay scheduled procedures.  2. Diet: Do not eat solid foods after midnight.  The patient may have clear liquids until 5am upon the day of the procedure.  3. Labs: You will need to have blood draw TODAY - BMP , CBC You do not need to be fasting.  4. Medication instructions in preparation for your procedure:   On the morning of your procedure, take your Aspirin 81 MG  and any morning medicines NOT listed above.  You may use sips of water.  5. Plan for one night stay--bring personal belongings. 6. Bring a current list of your  medications and current insurance cards. 7. You MUST have a responsible person to drive you home. 8. Someone MUST be with you the first 24 hours after you arrive home or your discharge will be delayed. 9. Please wear clothes that are easy to get on and off and wear slip-on shoes.  Thank you for allowing [place] to care for you!   -- [facility]

## 2019-02-15 LAB — BASIC METABOLIC PANEL
BUN/Creatinine Ratio: 14 (ref 10–24)
BUN: 13 mg/dL (ref 8–27)
CO2: 25 mmol/L (ref 20–29)
Calcium: 9.5 mg/dL (ref 8.6–10.2)
Chloride: 102 mmol/L (ref 96–106)
Creatinine, Ser: 0.9 mg/dL (ref 0.76–1.27)
GFR calc Af Amer: 98 mL/min/{1.73_m2} (ref >59)
GFR calc non Af Amer: 85 mL/min/{1.73_m2} (ref >59)
Glucose: 84 mg/dL (ref 65–99)
Potassium: 3.8 mmol/L (ref 3.5–5.2)
Sodium: 143 mmol/L (ref 134–144)

## 2019-02-15 LAB — CBC
Hematocrit: 49.2 % (ref 37.5–51.0)
Hemoglobin: 16.3 g/dL (ref 13.0–17.7)
MCH: 30.1 pg (ref 26.6–33.0)
MCHC: 33.1 g/dL (ref 31.5–35.7)
MCV: 91 fL (ref 79–97)
Platelets: 240 10*3/uL (ref 150–450)
RBC: 5.41 x10E6/uL (ref 4.14–5.80)
RDW: 12.5 % (ref 11.6–15.4)
WBC: 11.4 10*3/uL — ABNORMAL HIGH (ref 3.4–10.8)

## 2019-02-16 ENCOUNTER — Encounter: Payer: Self-pay | Admitting: Cardiology

## 2019-02-16 NOTE — Assessment & Plan Note (Signed)
Has not had labs checked since last year, but LDL calculated last year was 176 with a total cholesterol 239.  Very poorly controlled.  Has been relatively reluctant to do take a statin.  Pending cath results, may want at least consider low-dose rosuvastatin and potential referral for CVR our evaluation to potentially start PCSK9 inhibitor if cath results warrant.

## 2019-02-16 NOTE — Assessment & Plan Note (Signed)
Closely monitor glycemic control.  Low threshold to consider metformin.

## 2019-02-16 NOTE — Assessment & Plan Note (Addendum)
Blood pressure looks pretty good today, but is on relatively low-dose amlodipine, HCTZ, Toprol and valsartan. Plan: Reduce Toprol to 25 mg and potentially hold HCTZ while we restart amlodipine 2.5 mg daily for additional antianginal benefit.

## 2019-02-16 NOTE — Assessment & Plan Note (Addendum)
Increasing episodes of exertional dyspnea and chest pain concerning for progression from class II to class III angina. Based on the classic nature of his symptoms, I do not think that a stress test will change my clinical judgment that this is angina and therefore I think the best course of action is to proceed with cardiac catheterization and possible PCI.  Plan: Schedule cardiac catheterization for next possible date with me.  With bradycardia, I cannot titrate beta-blocker any further, in fact would like to reduce Toprol to 25 mg daily which will allow him to restart amlodipine 2.5 mg daily.  (In the past, he did not tolerate that increase with current dose of Toprol).  If blood pressure still is low, would hold HCTZ as opposed to reducing amlodipine.  Not currently on statin--we will need to check lipid panel and address accordingly based on cath results.  Performing MD:  Glenetta Hew, M.D., M.S.  Procedure: LEFT HEART CATHETERIZATION WITH CORONARY ANGIOGRAPHY AND POSSIBLE PERCUTANEOUS CORONARY INTERVENTION  The procedure with Risks/Benefits/Alternatives and Indications was reviewed with the patient.  All questions were answered.    Risks / Complications include, but not limited to: Death, MI, CVA/TIA, VF/VT (with defibrillation), Bradycardia (need for temporary pacer placement), contrast induced nephropathy, bleeding / bruising / hematoma / pseudoaneurysm, vascular or coronary injury (with possible emergent CT or Vascular Surgery), adverse medication reactions, infection.  Additional risks involving the use of radiation with the possibility of radiation burns and cancer were explained in detail.  The patient voices understanding and agree to proceed.

## 2019-02-20 ENCOUNTER — Telehealth: Payer: Self-pay | Admitting: Cardiology

## 2019-02-20 NOTE — Telephone Encounter (Signed)
New message   Patient states that he has had an 8 to 10 lb weight gain. Patient wants to know if this could be coming from one of his medications?  Please call.

## 2019-02-20 NOTE — Telephone Encounter (Signed)
Hold amlodipine x 3 d - take HCTZ instead.  If better, then just stay off Amlodipine & stay on HCTZ.  Glenetta Hew, MD

## 2019-02-20 NOTE — Telephone Encounter (Signed)
Called patient and advised. Patient verbalized understanding.  

## 2019-02-20 NOTE — Telephone Encounter (Signed)
Called patient, he states he last seen Dr.Harding on 07/02, at this visit he stopped HCTZ, restarted Amlodipine and cut Toprol in half. He states since that visit he has gained 8-10 lbs. His current weight this morning was 188 lb. He states when he was in office he had on jeans and items in his pockets which is why his weight at the visit was 189 lb. Patient states he has had an increased in swelling in his hands, feet and abdomen. He has no new SOB, or no new CP since visit. He has no changes in his diet since visit. He is to have a cath on 07/14 (Tuesday) and he is concerned this may be pushed back.   Advised I would route a message back to MD to advise on this swelling.

## 2019-02-21 ENCOUNTER — Other Ambulatory Visit: Payer: Self-pay | Admitting: Family Medicine

## 2019-02-22 ENCOUNTER — Other Ambulatory Visit (HOSPITAL_COMMUNITY)
Admission: RE | Admit: 2019-02-22 | Discharge: 2019-02-22 | Disposition: A | Discharge status: Home / Self Care | Payer: Medicare Other | Source: Ambulatory Visit | Attending: Cardiology | Admitting: Cardiology

## 2019-02-22 DIAGNOSIS — Z1159 Encounter for screening for other viral diseases: Secondary | ICD-10-CM | POA: Insufficient documentation

## 2019-02-22 DIAGNOSIS — Z01812 Encounter for preprocedural laboratory examination: Secondary | ICD-10-CM | POA: Diagnosis not present

## 2019-02-23 LAB — SARS CORONAVIRUS 2 (TAT 6-24 HRS): SARS Coronavirus 2: NEGATIVE

## 2019-02-25 ENCOUNTER — Telehealth: Payer: Self-pay | Admitting: *Deleted

## 2019-02-25 NOTE — Telephone Encounter (Deleted)
At this time, due to Covid-19 pandemic, pt aware no visitors will be allowed to accompany them in to hospital. Their designated party will be called after procedure with an update.   Patients are required to wear a mask when they enter the hospital.      COVID-19 Pre-Screening Questions:  . In the past 7 to 10 days have you had a cough,  shortness of breath, headache, congestion, fever (100 or greater) body aches, chills, sore throat, or sudden lo  . Have you been around anyone with known Covid 19. . Have you been around anyone who is awaiting Covid 19 test results in the past 7 to 10 days? . Have you been around anyone who has been exposed to Covid 19, or has mentioned symptoms of Covid 19 within the past 7 to 10 days?

## 2019-02-25 NOTE — Telephone Encounter (Deleted)
Pt contacted pre-catheterization scheduled at [facility] for: Tuesday Feb 26, 2019 7:30 AM Verified arrival time and place: [place] [facility] A at: 5:30 AM  Covid-19 test date: 02/22/19  No solid food after midnight prior to cath, clear liquids until 5 AM day of procedure. Contrast allergy:no  Hold: HCTZ-AM of procedure  Except hold medications AM meds can be  taken pre-cath with sip of water including: ASA 81 mg   Confirmed patient has responsible person to drive home post procedure and observe 24 hours after arriving home: yes      COVID-19 Pre-Screening Questions:  . In the past 7 to 10 days have you had a cough,  shortness of breath, headache, congestion, fever (100 or greater) body aches, chills, sore throat, or sudden loss of taste or sense of smell? . Have you been around anyone with known Covid 19. . Have you been around anyone who is awaiting Covid 19 test results in the past 7 to 10 days? . Have you been around anyone who has been exposed to Covid 19, or has mentioned symptoms of Covid 19 within the past 7 to 10 days?  If you have any concerns/questions about symptoms patients report during screening (either on the phone or at threshold). Contact the provider seeing the patient or DOD for further guidance.  If neither are available contact a member of the leadership team.

## 2019-02-25 NOTE — Telephone Encounter (Signed)
    COVID-19 Pre-Screening Questions:  . In the past 7 to 10 days have you had a cough,  shortness of breath, headache, congestion, fever (100 or greater) body aches, chills, sore throat, or sudden loss of taste or sense of smell? Shortness of breath and cough-not new in past 7-10 days . Have you been around anyone with known Covid 19? no . Have you been around anyone who is awaiting Covid 19 test results in the past 7 to 10 days? no . Have you been around anyone who has been exposed to Covid 19, or has mentioned symptoms of Covid 19 within the past 7 to 10 days? no  Patient aware at this time, due to Covid-19 pandemic, no visitors will be allowed in the hospital. Their designated party will be called after procedure with an update.   Patients are required to wear a mask when they enter the hospital.   I reviewed procedure instructions, mask/visitor/ Covid-19 screening questions with patient, he verbalized understanding.

## 2019-02-25 NOTE — Telephone Encounter (Signed)
Pt contacted pre-catheterization scheduled at [facility] for: Tuesday February 26, 2019 7:30 AM Verified arrival time and place: [place] En[facility] A at: 5:30 AM  Covid-19 test date: 02/22/19  No solid food after midnight prior to cath, clear liquids until 5 AM day of procedure. Contrast allergy:no  Hold: HCTZ-AM of procedure  Except hold medications AM meds can be  taken pre-cath with sip of water including: ASA 81 mg   Confirmed patient has responsible person to drive home post procedure and observe 24 hours after arriving home: yes

## 2019-02-26 ENCOUNTER — Ambulatory Visit (HOSPITAL_COMMUNITY)
Admission: RE | Admit: 2019-02-26 | Discharge: 2019-02-27 | Disposition: A | Discharge status: Home / Self Care | Payer: Medicare Other | Attending: Cardiology | Admitting: Cardiology

## 2019-02-26 ENCOUNTER — Encounter (HOSPITAL_COMMUNITY): Payer: Self-pay | Admitting: General Practice

## 2019-02-26 ENCOUNTER — Other Ambulatory Visit: Payer: Self-pay

## 2019-02-26 ENCOUNTER — Ambulatory Visit (HOSPITAL_COMMUNITY)
Admission: RE | Disposition: A | Discharge status: Home / Self Care | Payer: Self-pay | Source: Home / Self Care | Attending: Cardiology

## 2019-02-26 DIAGNOSIS — I714 Abdominal aortic aneurysm, without rupture: Secondary | ICD-10-CM | POA: Diagnosis not present

## 2019-02-26 DIAGNOSIS — Z823 Family history of stroke: Secondary | ICD-10-CM | POA: Diagnosis not present

## 2019-02-26 DIAGNOSIS — I2089 Other forms of angina pectoris: Secondary | ICD-10-CM | POA: Diagnosis present

## 2019-02-26 DIAGNOSIS — Z9852 Vasectomy status: Secondary | ICD-10-CM | POA: Diagnosis not present

## 2019-02-26 DIAGNOSIS — M1990 Unspecified osteoarthritis, unspecified site: Secondary | ICD-10-CM | POA: Insufficient documentation

## 2019-02-26 DIAGNOSIS — Z9861 Coronary angioplasty status: Secondary | ICD-10-CM

## 2019-02-26 DIAGNOSIS — I25119 Atherosclerotic heart disease of native coronary artery with unspecified angina pectoris: Secondary | ICD-10-CM | POA: Insufficient documentation

## 2019-02-26 DIAGNOSIS — Z87891 Personal history of nicotine dependence: Secondary | ICD-10-CM | POA: Insufficient documentation

## 2019-02-26 DIAGNOSIS — E876 Hypokalemia: Secondary | ICD-10-CM

## 2019-02-26 DIAGNOSIS — R0602 Shortness of breath: Secondary | ICD-10-CM | POA: Insufficient documentation

## 2019-02-26 DIAGNOSIS — Z880 Allergy status to penicillin: Secondary | ICD-10-CM | POA: Diagnosis not present

## 2019-02-26 DIAGNOSIS — I97630 Postprocedural hematoma of a circulatory system organ or structure following a cardiac catheterization: Secondary | ICD-10-CM | POA: Diagnosis not present

## 2019-02-26 DIAGNOSIS — I2510 Atherosclerotic heart disease of native coronary artery without angina pectoris: Secondary | ICD-10-CM

## 2019-02-26 DIAGNOSIS — I10 Essential (primary) hypertension: Secondary | ICD-10-CM | POA: Diagnosis present

## 2019-02-26 DIAGNOSIS — M7989 Other specified soft tissue disorders: Secondary | ICD-10-CM | POA: Diagnosis not present

## 2019-02-26 DIAGNOSIS — Z79899 Other long term (current) drug therapy: Secondary | ICD-10-CM | POA: Insufficient documentation

## 2019-02-26 DIAGNOSIS — Z955 Presence of coronary angioplasty implant and graft: Secondary | ICD-10-CM

## 2019-02-26 DIAGNOSIS — Z8249 Family history of ischemic heart disease and other diseases of the circulatory system: Secondary | ICD-10-CM | POA: Insufficient documentation

## 2019-02-26 DIAGNOSIS — I209 Angina pectoris, unspecified: Secondary | ICD-10-CM | POA: Diagnosis present

## 2019-02-26 DIAGNOSIS — K219 Gastro-esophageal reflux disease without esophagitis: Secondary | ICD-10-CM | POA: Insufficient documentation

## 2019-02-26 DIAGNOSIS — E785 Hyperlipidemia, unspecified: Secondary | ICD-10-CM | POA: Diagnosis not present

## 2019-02-26 HISTORY — PX: INTRAVASCULAR PRESSURE WIRE/FFR STUDY: CATH118243

## 2019-02-26 HISTORY — PX: LEFT HEART CATH AND CORONARY ANGIOGRAPHY: CATH118249

## 2019-02-26 HISTORY — PX: CORONARY BALLOON ANGIOPLASTY: CATH118233

## 2019-02-26 HISTORY — DX: Coronary angioplasty status: Z98.61

## 2019-02-26 HISTORY — DX: Atherosclerotic heart disease of native coronary artery without angina pectoris: I25.10

## 2019-02-26 HISTORY — PX: CORONARY STENT INTERVENTION: CATH118234

## 2019-02-26 LAB — POCT ACTIVATED CLOTTING TIME
Activated Clotting Time: 263 seconds
Activated Clotting Time: 307 seconds
Activated Clotting Time: 241 seconds
Activated Clotting Time: 279 seconds

## 2019-02-26 SURGERY — LEFT HEART CATH AND CORONARY ANGIOGRAPHY
Anesthesia: LOCAL

## 2019-02-26 MED ORDER — LATANOPROST 0.005 % OP SOLN
1.0000 [drp] | Freq: Every day | OPHTHALMIC | Status: DC
  Filled 2019-02-26: qty 2.5

## 2019-02-26 MED ORDER — SODIUM CHLORIDE 0.9% FLUSH: 3.0000 mL | Freq: Two times a day (BID) | INTRAVENOUS | Status: DC

## 2019-02-26 MED ORDER — METOPROLOL SUCCINATE ER 50 MG PO TB24
50.0000 mg | ORAL_TABLET | Freq: Every day | ORAL | Status: DC
  Administered 2019-02-27: 10:00:00 50 mg via ORAL
  Filled 2019-02-26: qty 1

## 2019-02-26 MED ORDER — HEPARIN SODIUM (PORCINE) 1000 UNIT/ML IJ SOLN
INTRAMUSCULAR | Status: AC
  Filled 2019-02-26: qty 1

## 2019-02-26 MED ORDER — NITROGLYCERIN 1 MG/10 ML FOR IR/CATH LAB
INTRA_ARTERIAL | Status: AC
  Filled 2019-02-26: qty 10

## 2019-02-26 MED ORDER — LIDOCAINE HCL (PF) 1 % IJ SOLN
INTRAMUSCULAR | Status: AC
  Filled 2019-02-26: qty 30

## 2019-02-26 MED ORDER — MIDAZOLAM HCL 2 MG/2ML IJ SOLN
INTRAMUSCULAR | Status: AC
  Filled 2019-02-26: qty 2

## 2019-02-26 MED ORDER — HYDRALAZINE HCL 20 MG/ML IJ SOLN: 10.0000 mg | INTRAMUSCULAR | Status: AC | PRN

## 2019-02-26 MED ORDER — SODIUM CHLORIDE 0.9% FLUSH
3.0000 mL | Freq: Two times a day (BID) | INTRAVENOUS | Status: DC
  Administered 2019-02-26: 23:00:00 3 mL via INTRAVENOUS

## 2019-02-26 MED ORDER — MORPHINE SULFATE (PF) 2 MG/ML IV SOLN
INTRAVENOUS | Status: AC
  Filled 2019-02-26: qty 1

## 2019-02-26 MED ORDER — CLOPIDOGREL BISULFATE 300 MG PO TABS
ORAL_TABLET | ORAL | Status: DC | PRN
  Administered 2019-02-26: 600 mg via ORAL

## 2019-02-26 MED ORDER — HEPARIN SODIUM (PORCINE) 1000 UNIT/ML IJ SOLN
INTRAMUSCULAR | Status: DC | PRN
  Administered 2019-02-26: 3000 [IU] via INTRAVENOUS
  Administered 2019-02-26: 4000 [IU] via INTRAVENOUS
  Administered 2019-02-26: 5000 [IU] via INTRAVENOUS
  Administered 2019-02-26: 2000 [IU] via INTRAVENOUS

## 2019-02-26 MED ORDER — ACETAMINOPHEN 325 MG PO TABS
650.0000 mg | ORAL_TABLET | ORAL | Status: DC | PRN
  Administered 2019-02-26 – 2019-02-27 (×2): 650 mg via ORAL
  Filled 2019-02-26 (×2): qty 2

## 2019-02-26 MED ORDER — SODIUM CHLORIDE 0.9 % WEIGHT BASED INFUSION: 1.0000 mL/kg/h | INTRAVENOUS | Status: DC

## 2019-02-26 MED ORDER — VERAPAMIL HCL 2.5 MG/ML IV SOLN
INTRAVENOUS | Status: AC
  Filled 2019-02-26: qty 2

## 2019-02-26 MED ORDER — HEPARIN (PORCINE) IN NACL 1000-0.9 UT/500ML-% IV SOLN
INTRAVENOUS | Status: AC
  Filled 2019-02-26: qty 1000

## 2019-02-26 MED ORDER — SODIUM CHLORIDE 0.9% FLUSH: 3.0000 mL | INTRAVENOUS | Status: DC | PRN

## 2019-02-26 MED ORDER — SODIUM CHLORIDE 0.9 % IV SOLN: INTRAVENOUS | Status: AC

## 2019-02-26 MED ORDER — VERAPAMIL HCL 2.5 MG/ML IV SOLN
INTRAVENOUS | Status: DC | PRN
  Administered 2019-02-26 (×2): 10 mL via INTRA_ARTERIAL

## 2019-02-26 MED ORDER — MORPHINE SULFATE (PF) 2 MG/ML IV SOLN
2.0000 mg | INTRAVENOUS | Status: DC | PRN
  Administered 2019-02-26: 16:00:00 2 mg via INTRAVENOUS

## 2019-02-26 MED ORDER — ASPIRIN 81 MG PO CHEW: 81.0000 mg | CHEWABLE_TABLET | ORAL | Status: DC

## 2019-02-26 MED ORDER — FENTANYL CITRATE (PF) 100 MCG/2ML IJ SOLN
INTRAMUSCULAR | Status: AC
  Filled 2019-02-26: qty 2

## 2019-02-26 MED ORDER — LIDOCAINE HCL (PF) 1 % IJ SOLN
INTRAMUSCULAR | Status: DC | PRN
  Administered 2019-02-26: 10 mL

## 2019-02-26 MED ORDER — ONDANSETRON HCL 4 MG/2ML IJ SOLN: 4.0000 mg | Freq: Four times a day (QID) | INTRAMUSCULAR | Status: DC | PRN

## 2019-02-26 MED ORDER — CLOPIDOGREL BISULFATE 300 MG PO TABS
ORAL_TABLET | ORAL | Status: AC
  Filled 2019-02-26: qty 2

## 2019-02-26 MED ORDER — ADENOSINE 12 MG/4ML IV SOLN
INTRAVENOUS | Status: AC
  Filled 2019-02-26: qty 16

## 2019-02-26 MED ORDER — FENTANYL CITRATE (PF) 100 MCG/2ML IJ SOLN
INTRAMUSCULAR | Status: DC | PRN
  Administered 2019-02-26 (×4): 25 ug via INTRAVENOUS

## 2019-02-26 MED ORDER — IRBESARTAN 150 MG PO TABS
150.0000 mg | ORAL_TABLET | Freq: Every day | ORAL | Status: DC
  Administered 2019-02-27: 10:00:00 150 mg via ORAL
  Filled 2019-02-26: qty 1

## 2019-02-26 MED ORDER — LABETALOL HCL 5 MG/ML IV SOLN: 10.0000 mg | INTRAVENOUS | Status: AC | PRN

## 2019-02-26 MED ORDER — NITROGLYCERIN 1 MG/10 ML FOR IR/CATH LAB
INTRA_ARTERIAL | Status: DC | PRN
  Administered 2019-02-26 (×4): 200 ug via INTRACORONARY

## 2019-02-26 MED ORDER — FAMOTIDINE IN NACL 20-0.9 MG/50ML-% IV SOLN
INTRAVENOUS | Status: AC | PRN
  Administered 2019-02-26: 20 mg via INTRAVENOUS

## 2019-02-26 MED ORDER — HYDROCHLOROTHIAZIDE 12.5 MG PO CAPS
12.5000 mg | ORAL_CAPSULE | Freq: Every day | ORAL | Status: DC
  Administered 2019-02-27: 10:00:00 12.5 mg via ORAL
  Filled 2019-02-26: qty 1

## 2019-02-26 MED ORDER — FAMOTIDINE IN NACL 20-0.9 MG/50ML-% IV SOLN
INTRAVENOUS | Status: AC
  Filled 2019-02-26: qty 50

## 2019-02-26 MED ORDER — ANGIOPLASTY BOOK
Freq: Once | Status: AC
  Administered 2019-02-27: 01:00:00 1
  Filled 2019-02-26: qty 1

## 2019-02-26 MED ORDER — CLOPIDOGREL BISULFATE 75 MG PO TABS
75.0000 mg | ORAL_TABLET | Freq: Every day | ORAL | Status: DC
  Administered 2019-02-27: 75 mg via ORAL
  Filled 2019-02-26: qty 1

## 2019-02-26 MED ORDER — ASPIRIN EC 81 MG PO TBEC
81.0000 mg | DELAYED_RELEASE_TABLET | Freq: Every day | ORAL | Status: DC
  Administered 2019-02-27: 10:00:00 81 mg via ORAL
  Filled 2019-02-26: qty 1

## 2019-02-26 MED ORDER — SODIUM CHLORIDE 0.9 % WEIGHT BASED INFUSION
3.0000 mL/kg/h | INTRAVENOUS | Status: DC
  Administered 2019-02-26: 07:00:00 3 mL/kg/h via INTRAVENOUS

## 2019-02-26 MED ORDER — ADENOSINE (DIAGNOSTIC) 140MCG/KG/MIN
INTRAVENOUS | Status: DC | PRN
  Administered 2019-02-26: 140 ug/kg/min via INTRAVENOUS

## 2019-02-26 MED ORDER — SODIUM CHLORIDE 0.9 % IV SOLN: 250.0000 mL | INTRAVENOUS | Status: DC | PRN

## 2019-02-26 MED ORDER — MIDAZOLAM HCL 2 MG/2ML IJ SOLN
INTRAMUSCULAR | Status: DC | PRN
  Administered 2019-02-26 (×2): 1 mg via INTRAVENOUS
  Administered 2019-02-26: 2 mg via INTRAVENOUS

## 2019-02-26 MED ORDER — IOHEXOL 350 MG/ML SOLN
INTRAVENOUS | Status: DC | PRN
  Administered 2019-02-26: 190 mL via INTRA_ARTERIAL

## 2019-02-26 SURGICAL SUPPLY — 23 items
BAG SNAP BAND KOVER 36X36 (MISCELLANEOUS) ×1 IMPLANT
BALLN WOLVERINE 2.00X10 (BALLOONS) ×2
BALLN WOLVERINE 2.50X15 (BALLOONS) ×2
BALLOON WOLVERINE 2.00X10 (BALLOONS) IMPLANT
BALLOON WOLVERINE 2.50X15 (BALLOONS) IMPLANT
BAND CMPR LRG ZPHR (HEMOSTASIS) ×1
BAND ZEPHYR COMPRESS 30 LONG (HEMOSTASIS) ×1 IMPLANT
CATH LAUNCHER 6FR EBU3.5 (CATHETERS) ×1 IMPLANT
CATH OPTITORQUE TIG 4.0 5F (CATHETERS) ×1 IMPLANT
CATH VISTA GUIDE 6FR JR4 (CATHETERS) ×1 IMPLANT
COVER DOME SNAP 22 D (MISCELLANEOUS) ×1 IMPLANT
GLIDESHEATH SLEND A-KIT 6F 22G (SHEATH) ×1 IMPLANT
GUIDEWIRE INQWIRE 1.5J.035X260 (WIRE) IMPLANT
GUIDEWIRE PRESSURE COMET II (WIRE) ×1 IMPLANT
INQWIRE 1.5J .035X260CM (WIRE) ×2
KIT ENCORE 26 ADVANTAGE (KITS) ×1 IMPLANT
KIT HEART LEFT (KITS) ×2 IMPLANT
PACK CARDIAC CATHETERIZATION (CUSTOM PROCEDURE TRAY) ×2 IMPLANT
SHEATH PROBE COVER 6X72 (BAG) ×1 IMPLANT
STENT RESOLUTE ONYX 2.5X15 (Permanent Stent) ×1 IMPLANT
TRANSDUCER W/STOPCOCK (MISCELLANEOUS) ×2 IMPLANT
TUBING CIL FLEX 10 FLL-RA (TUBING) ×2 IMPLANT
WIRE ASAHI PROWATER 180CM (WIRE) ×1 IMPLANT

## 2019-02-26 NOTE — Consult Note (Signed)
[facility] [place]  Patient name: Luis Guzman MRN: [id number] DOB: 12-Jan-1946 Sex: male   REQUESTING PROVIDER:    Dr. Ellyn Hack   REASON FOR CONSULT:    Bleeding after heart cath from right radial approach  HISTORY OF PRESENT ILLNESS:   [name] is a 73 y.o. male, who is status post coronary angiography with angioplasty and stenting earlier today via a right radial approach.  The patient developed a recurrent right forearm hematoma.  This was treated with placing a blood pressure cuff on his forearm with good results however upon removal his forearm became hard and tender and he had some numbness in his fingers and swelling as well as discoloration.  The blood pressure cuff was reinflated per protocol.  I was asked to come evaluate him for possible compartment syndrome.  The patient is on aspirin and Plavix.  The patient suffers from hypercholesterolemia but has been reluctant to start a statin.  He is medically managed for hypertension.  He has borderline hyperglycemia.  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  PAST MEDICAL HISTORY    Past Medical History:  Diagnosis Date  . Coronary artery disease   . GERD (gastroesophageal reflux disease)   . History of hiatal hernia    40 yrs ago  . HYPERLIPIDEMIA 01/26/2009  . HYPERTENSION 01/26/2009  . 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 lower back pain, s/p cervical neck surgery"stiffness" remains  . Transfusion history    infant "anemia"     FAMILY HISTORY   Family History  Problem Relation Age of Onset  . Hypertension Mother   . Hypertension Father   . Arthritis Sister        rheumatoid  . Stroke Sister 45    SOCIAL HISTORY:   Social History   Socioeconomic History  . Marital status: Married    Spouse name: Not on file  . Number of children: Not on file  . Years of education: Not on file  . Highest education  level: Not on file  Occupational History  . Not on file  Social Needs  . Financial resource strain: Not on file  . Food insecurity    Worry: Not on file    Inability: Not on file  . Transportation needs    Medical: Not on file    Non-medical: Not on file  Tobacco Use  . Smoking status: Former Smoker    Packs/day: 1.00    Years: 5.00    Pack years: 5.00    Types: Cigarettes    Quit date: 10/17/1968    Years since quitting: 50.3  . Smokeless tobacco: Never Used  Substance and Sexual Activity  . Alcohol use: Not Currently    Comment: "quit drinking ~ 10/1968  . Drug use: No  . Sexual activity: Not Currently  Lifestyle  . Physical activity    Days per week: Not on file    Minutes per session: Not on file  . Stress: Not on file  Relationships  . Social Herbalist on phone: Not on file    Gets together: Not on file    Attends religious service: Not on file    Active member of club or organization: Not on file    Attends meetings of clubs or organizations: Not on file    Relationship status: Not on file  . Intimate partner violence    Fear of current or ex  partner: Not on file    Emotionally abused: Not on file    Physically abused: Not on file    Forced sexual activity: Not on file  Other Topics Concern  . Not on file  Social History Narrative  . Not on file    ALLERGIES:    Allergies  Allergen Reactions  . Penicillins Itching    Has patient had a PCN reaction causing immediate rash, facial/tongue/throat swelling, SOB or lightheadedness with hypotension: no Has patient had a PCN reaction causing severe rash involving mucus membranes or skin necrosis: unkn Has patient had a PCN reaction that required hospitalization: no Has patient had a PCN reaction occurring within the last 10 years: no If all of the above answers are "NO", then may proceed with Cephalosporin use.     CURRENT MEDICATIONS:    Current Facility-Administered Medications  Medication  Dose Route Frequency Provider Last Rate Last Dose  . 0.9 %  sodium chloride infusion   Intravenous Continuous Leonie Man, MD 75 mL/hr at 02/26/19 1048    . 0.9 %  sodium chloride infusion  250 mL Intravenous PRN Leonie Man, MD      . acetaminophen (TYLENOL) tablet 650 mg  650 mg Oral Q4H PRN Leonie Man, MD   650 mg at 02/26/19 1441  . [START ON 02/27/2019] aspirin EC tablet 81 mg  81 mg Oral Daily Leonie Man, MD      . Derrill Memo ON 02/27/2019] clopidogrel (PLAVIX) tablet 75 mg  75 mg Oral Q breakfast Leonie Man, MD      . hydrochlorothiazide (MICROZIDE) capsule 12.5 mg  12.5 mg Oral Daily Leonie Man, MD      . irbesartan Levy Sjogren) tablet 150 mg  150 mg Oral Daily Leonie Man, MD      . latanoprost (XALATAN) 0.005 % ophthalmic solution 1 drop  1 drop Both Eyes QHS Leonie Man, MD      . Derrill Memo ON 02/27/2019] metoprolol succinate (TOPROL-XL) 24 hr tablet 50 mg  50 mg Oral Daily Leonie Man, MD      . morphine 2 MG/ML injection 2 mg  2 mg Intravenous Q1H PRN Leonie Man, MD   2 mg at 02/26/19 1623  . morphine 2 MG/ML injection           . ondansetron (ZOFRAN) injection 4 mg  4 mg Intravenous Q6H PRN Leonie Man, MD      . sodium chloride flush (NS) 0.9 % injection 3 mL  3 mL Intravenous Q12H Leonie Man, MD      . sodium chloride flush (NS) 0.9 % injection 3 mL  3 mL Intravenous Q12H Leonie Man, MD      . sodium chloride flush (NS) 0.9 % injection 3 mL  3 mL Intravenous PRN Leonie Man, MD        REVIEW OF SYSTEMS:   [X]  denotes positive finding, [ ]  denotes negative finding Cardiac  Comments:  Chest pain or chest pressure: x   Shortness of breath upon exertion:    Short of breath when lying flat:    Irregular heart rhythm:        Vascular    Pain in calf, thigh, or hip brought on by ambulation:    Pain in feet at night that wakes you up from your sleep:     Blood clot in your veins:    Leg swelling:  Pulmonary     Oxygen at home:    Productive cough:     Wheezing:         Neurologic    Sudden weakness in arms or legs:     Sudden numbness in arms or legs:     Sudden onset of difficulty speaking or slurred speech:    Temporary loss of vision in one eye:     Problems with dizziness:         Gastrointestinal    Blood in stool:      Vomited blood:         Genitourinary    Burning when urinating:     Blood in urine:        Psychiatric    Major depression:         Hematologic    Bleeding problems:    Problems with blood clotting too easily:        Skin    Rashes or ulcers:        Constitutional    Fever or chills:     PHYSICAL EXAM:   Vitals:   02/26/19 1331 02/26/19 1340 02/26/19 1429 02/26/19 1518  BP: (!) 151/68 (!) 151/72 (!) 155/86 (!) 163/73  Pulse:   88 (!) 58  Resp:  18 18   Temp:   98.1 F (36.7 C)   TempSrc:   Oral   SpO2:  98% 100% 99%  Weight:   85.5 kg   Height:   6\' 1"  (1.854 m)     GENERAL: The patient is a well-nourished male, in no acute distress. The vital signs are documented above. CARDIAC: There is a regular rate and rhythm.  VASCULAR: Palpable right radial pulse.  There is some fullness to the right forearm with ecchymosis as well as some edema and discoloration in his fingertips.  There is no evidence of forearm compartment syndrome. PULMONARY: Nonlabored respirations ABDOMEN: Soft and non-tender  MUSCULOSKELETAL: There are no major deformities or cyanosis. NEUROLOGIC: No focal weakness or paresthesias are detected. SKIN: There are no ulcers or rashes noted. PSYCHIATRIC: The patient has a normal affect.  STUDIES:   None  ASSESSMENT and PLAN   Right forearm hematoma: When I examined the patient the forearm was very soft.  Is somewhat tender.  He has a palpable radial pulse proximal and distal to the cannulation site.  He also has a palpable ulnar artery pulse.  He does not have any signs of compartment syndrome.  The blood pressure cuff on his  forearm was removed and the arm was wrapped with an Ace bandage.  Approximately 2 hours later, the patient began having some numbness in his fingertips and so the Ace bandage was removed.  I came back to reexamine the patient.  His forearm is slightly more full however it remains very soft.  He clearly does not have a compartment syndrome.  We will re-initiate placing a blood pressure cuff on his forearm.  Once the protocol has been completed his nurses have been asked to wrap the arm beginning at the fingertips with a Kerlix and a co-band.  They will contact me if he has any further issues.   Leia Alf, MD, FACS [facility] [phone] Pager [phone]

## 2019-02-26 NOTE — Progress Notes (Signed)
RN entered room to perform routine assessment of right radial cath site and deflate more air from patient's TR band per protocol. Patient states "I was just about to call you, my arm feels tight again and it is more painful." Patient's right arm was swollen and tight from wrist to antecubital area. Patient's right hand was also swollen. RN called cath lab to assess patient's arm. Cath lab staff came to bedside, applied pressure via manual BP cuff, and notified Dr. Ellyn Hack. Dr. Ellyn Hack came to bedside to assess.

## 2019-02-26 NOTE — Interval H&P Note (Signed)
History and Physical Interval Note:  02/26/2019 7:15 AM  Luis Guzman  has presented today for surgery, with the diagnosis of Angina, class III (progressive).  The various methods of treatment have been discussed with the patient and family. After consideration of risks, benefits and other options for treatment, the patient has consented to  Procedure(s): LEFT HEART CATH AND CORONARY ANGIOGRAPHY (N/A)  PERCUTANEOUS CORONARY INTERVENTION   As a surgical intervention.  The patient's history has been reviewed, patient examined, no change in status, stable for surgery.  I have reviewed the patient's chart and labs.  Questions were answered to the patient's satisfaction.   Cath Lab Visit (complete for each Cath Lab visit)  Clinical Evaluation Leading to the Procedure:   ACS: No.  Non-ACS:    Anginal Classification: CCS III  Anti-ischemic medical therapy: Minimal Therapy (1 class of medications) -did not tolerate amlodipine  Non-Invasive Test Results: No non-invasive testing performed  Prior CABG: No previous CABG   Glenetta Hew

## 2019-02-26 NOTE — Progress Notes (Signed)
   4:49 PM  Have been @ bedside for ~30 min - called b/c recurrent R forearm hematoma -- not at actual access site -- Zephyr Band actually dully deflated.    Pt stated that shortly after arriving to his room - he noted more pain in the forearm & hand -- asked for Tyelonol --> but the time the RN returned- the arm was notably more swollen than prior to his pain.   Cath Lab staff present - BP cuff applied to forearm per protocol.  Upon my exam -- arm is swollen & painful - somewhat taught. Hand is swollen& purple discoloration noted. He is able to feel his finger& move them - but difficult to make a fist.   Access site is intact with no bleeding or swelling.  BP cuff re-inflated in mid forearm - 100 mmHg - deflating per protocol.   Pulse waveform noted on Pleth.  I have talked with Dr. Trula Slade from [facility] to consult on ? His thoughts re possible compartment syndrome.   Glenetta Hew, MD

## 2019-02-27 ENCOUNTER — Encounter (HOSPITAL_COMMUNITY): Payer: Self-pay | Admitting: Cardiology

## 2019-02-27 DIAGNOSIS — M1990 Unspecified osteoarthritis, unspecified site: Secondary | ICD-10-CM | POA: Diagnosis not present

## 2019-02-27 DIAGNOSIS — K219 Gastro-esophageal reflux disease without esophagitis: Secondary | ICD-10-CM | POA: Diagnosis not present

## 2019-02-27 DIAGNOSIS — E782 Mixed hyperlipidemia: Secondary | ICD-10-CM | POA: Diagnosis not present

## 2019-02-27 DIAGNOSIS — Z9861 Coronary angioplasty status: Secondary | ICD-10-CM | POA: Diagnosis not present

## 2019-02-27 DIAGNOSIS — I97630 Postprocedural hematoma of a circulatory system organ or structure following a cardiac catheterization: Secondary | ICD-10-CM | POA: Diagnosis not present

## 2019-02-27 DIAGNOSIS — E785 Hyperlipidemia, unspecified: Secondary | ICD-10-CM | POA: Diagnosis not present

## 2019-02-27 DIAGNOSIS — M7989 Other specified soft tissue disorders: Secondary | ICD-10-CM | POA: Diagnosis not present

## 2019-02-27 DIAGNOSIS — I714 Abdominal aortic aneurysm, without rupture: Secondary | ICD-10-CM | POA: Diagnosis not present

## 2019-02-27 DIAGNOSIS — Z87891 Personal history of nicotine dependence: Secondary | ICD-10-CM | POA: Diagnosis not present

## 2019-02-27 DIAGNOSIS — I10 Essential (primary) hypertension: Secondary | ICD-10-CM | POA: Diagnosis not present

## 2019-02-27 DIAGNOSIS — Z79899 Other long term (current) drug therapy: Secondary | ICD-10-CM | POA: Diagnosis not present

## 2019-02-27 DIAGNOSIS — R0602 Shortness of breath: Secondary | ICD-10-CM | POA: Diagnosis not present

## 2019-02-27 DIAGNOSIS — E876 Hypokalemia: Secondary | ICD-10-CM

## 2019-02-27 DIAGNOSIS — Z880 Allergy status to penicillin: Secondary | ICD-10-CM | POA: Diagnosis not present

## 2019-02-27 DIAGNOSIS — I209 Angina pectoris, unspecified: Secondary | ICD-10-CM | POA: Diagnosis not present

## 2019-02-27 DIAGNOSIS — I25119 Atherosclerotic heart disease of native coronary artery with unspecified angina pectoris: Secondary | ICD-10-CM | POA: Diagnosis not present

## 2019-02-27 DIAGNOSIS — I2510 Atherosclerotic heart disease of native coronary artery without angina pectoris: Secondary | ICD-10-CM | POA: Diagnosis not present

## 2019-02-27 LAB — BASIC METABOLIC PANEL
Anion gap: 7 (ref 5–15)
BUN: 8 mg/dL (ref 8–23)
CO2: 27 mmol/L (ref 22–32)
Calcium: 8.7 mg/dL — ABNORMAL LOW (ref 8.9–10.3)
Chloride: 106 mmol/L (ref 98–111)
Creatinine, Ser: 0.92 mg/dL (ref 0.61–1.24)
GFR calc Af Amer: >60 mL/min (ref >60)
GFR calc non Af Amer: >60 mL/min (ref >60)
Glucose, Bld: 122 mg/dL — ABNORMAL HIGH (ref 70–99)
Potassium: 3 mmol/L — ABNORMAL LOW (ref 3.5–5.1)
Sodium: 140 mmol/L (ref 135–145)

## 2019-02-27 LAB — CBC
HCT: 42.2 % (ref 39.0–52.0)
Hemoglobin: 14.3 g/dL (ref 13.0–17.0)
MCH: 31.1 pg (ref 26.0–34.0)
MCHC: 33.9 g/dL (ref 30.0–36.0)
MCV: 91.7 fL (ref 80.0–100.0)
Platelets: 194 10*3/uL (ref 150–400)
RBC: 4.6 MIL/uL (ref 4.22–5.81)
RDW: 12.7 % (ref 11.5–15.5)
WBC: 9.8 10*3/uL (ref 4.0–10.5)
nRBC: 0 % (ref 0.0–0.2)

## 2019-02-27 MED ORDER — CLOPIDOGREL BISULFATE 75 MG PO TABS: 75.0000 mg | ORAL_TABLET | Freq: Every day | ORAL | 0 refills | Status: DC

## 2019-02-27 MED ORDER — POTASSIUM CHLORIDE CRYS ER 20 MEQ PO TBCR
40.0000 meq | EXTENDED_RELEASE_TABLET | Freq: Once | ORAL | Status: AC
  Administered 2019-02-27: 10:00:00 40 meq via ORAL
  Filled 2019-02-27: qty 2

## 2019-02-27 MED ORDER — POTASSIUM CHLORIDE CRYS ER 20 MEQ PO TBCR: 20.0000 meq | EXTENDED_RELEASE_TABLET | Freq: Every day | ORAL | 3 refills | Status: DC

## 2019-02-27 MED ORDER — POTASSIUM CHLORIDE CRYS ER 20 MEQ PO TBCR: 20.0000 meq | EXTENDED_RELEASE_TABLET | Freq: Every day | ORAL | Status: DC

## 2019-02-27 MED ORDER — ISOSORBIDE MONONITRATE ER 30 MG PO TB24
30.0000 mg | ORAL_TABLET | Freq: Every day | ORAL | Status: DC
  Filled 2019-02-27: qty 1

## 2019-02-27 MED ORDER — ISOSORBIDE MONONITRATE ER 30 MG PO TB24: 30.0000 mg | ORAL_TABLET | Freq: Every evening | ORAL | 3 refills | Status: DC

## 2019-02-27 MED ORDER — ASPIRIN EC 81 MG PO TBEC: 81.0000 mg | DELAYED_RELEASE_TABLET | Freq: Every evening | ORAL | Status: DC

## 2019-02-27 MED ORDER — ISOSORBIDE MONONITRATE ER 30 MG PO TB24: 30.0000 mg | ORAL_TABLET | Freq: Every evening | ORAL | Status: DC

## 2019-02-27 MED ORDER — POTASSIUM CHLORIDE CRYS ER 20 MEQ PO TBCR: 20.0000 meq | EXTENDED_RELEASE_TABLET | Freq: Every day | ORAL | 0 refills | Status: DC

## 2019-02-27 MED ORDER — CLOPIDOGREL BISULFATE 75 MG PO TABS: 75.0000 mg | ORAL_TABLET | Freq: Every day | ORAL | 3 refills | Status: DC

## 2019-02-27 MED ORDER — ISOSORBIDE MONONITRATE ER 30 MG PO TB24: 30.0000 mg | ORAL_TABLET | Freq: Every evening | ORAL | 0 refills | Status: DC

## 2019-02-27 MED ORDER — METOPROLOL SUCCINATE ER 50 MG PO TB24: 50.0000 mg | ORAL_TABLET | Freq: Every day | ORAL | Status: DC

## 2019-02-27 MED FILL — Heparin Sod (Porcine)-NaCl IV Soln 1000 Unit/500ML-0.9%: INTRAVENOUS | Qty: 1000 | Status: AC

## 2019-02-27 NOTE — Progress Notes (Addendum)
  Progress Note    02/27/2019 8:45 AM 1 Day Post-Op  Subjective:  Denies numbness weakness R hand; much improved compared to last night   Vitals:   02/26/19 2018 02/27/19 0423  BP: (!) 146/83 (!) 149/77  Pulse: 91 67  Resp: 20 16  Temp: 98.4 F (36.9 C) 98.3 F (36.8 C)  SpO2: 98% 98%   Physical Exam Lungs:  Non labored Incisions:  R radial access site without hematoma Extremities:  Palpable R radial; soft R forearm; grip strength intact Neurologic: sensation intact R hand  CBC    Component Value Date/Time   WBC 9.8 02/27/2019 0517   RBC 4.60 02/27/2019 0517   HGB 14.3 02/27/2019 0517   HGB 16.3 02/14/2019 1622   HCT 42.2 02/27/2019 0517   HCT 49.2 02/14/2019 1622   PLT 194 02/27/2019 0517   PLT 240 02/14/2019 1622   MCV 91.7 02/27/2019 0517   MCV 91 02/14/2019 1622   MCH 31.1 02/27/2019 0517   MCHC 33.9 02/27/2019 0517   RDW 12.7 02/27/2019 0517   RDW 12.5 02/14/2019 1622   LYMPHSABS 1.8 04/18/2017 0851   MONOABS 0.7 04/18/2017 0851   EOSABS 0.1 04/18/2017 0851   BASOSABS 0.1 04/18/2017 0851    BMET    Component Value Date/Time   NA 140 02/27/2019 0517   NA 143 02/14/2019 1622   K 3.0 (L) 02/27/2019 0517   CL 106 02/27/2019 0517   CO2 27 02/27/2019 0517   GLUCOSE 122 (H) 02/27/2019 0517   BUN 8 02/27/2019 0517   BUN 13 02/14/2019 1622   CREATININE 0.92 02/27/2019 0517   CREATININE 0.80 07/12/2013 1553   CALCIUM 8.7 (L) 02/27/2019 0517   GFRNONAA >60 02/27/2019 0517   GFRAA >60 02/27/2019 0517    INR    Component Value Date/Time   INR 1.02 03/12/2014 1428     Intake/Output Summary (Last 24 hours) at 02/27/2019 0845 Last data filed at 02/27/2019 6433 Gross per 24 hour  Intake 1924.9 ml  Output 450 ml  Net 1474.9 ml     Assessment/Plan:  73 y.o. male is s/p cardiac cath via R radial approach; question of compartment R forearm 1 Day Post-Op   Forearm soft; R hand strength and sensation intact Perfusing R hand well No compartment noted  on exam Vascular surgery will sign off   Dagoberto Ligas, PA-C [facility] [phone] 02/27/2019 8:45 AM   I agree with the above.  I have seen and evaluated the patient and agree with the above plan.  The patient's forearm remains soft.  He has palpable radial pulse.  No signs of compartment syndrome.  He is stable from my perspective.  Please contact me with any further questions.  Annamarie Major

## 2019-02-27 NOTE — Discharge Summary (Addendum)
Discharge Summary    Patient ID: Luis Guzman,  MRN: [id number], DOB/AGE: 1945-09-17 73 y.o.  Admit date: 02/26/2019 Discharge date: 02/27/2019  Primary Care Provider: [facility] Post Primary Cardiologist: Glenetta Hew, MD Primary Electrophysiologist:  None  Discharge Diagnoses    Principal Problem:   Angina, class III [facility]) Active Problems:   Hyperlipidemia   Essential hypertension   CAD S/P percutaneous coronary angioplasty   Coronary artery disease   Hypokalemia  Diagnostic Studies/Procedures    Cardiac catheterization this admission, please see full report and below for summary.  Conclusion    The left ventricular systolic function is normal. The left ventricular ejection fraction is 55-65% by visual estimate.  LV end diastolic pressure is normal.  Lesion #1: Mid Cx lesion is 65% stenosed. -DFR 0.92, FFR 0.82. Not physiologically significant  Lesion #2: 1st Diag lesion is 85% stenosed.  Scoring balloon angioplasty was performed using a BALLOON WOLVERINE 2.00X10.  Post intervention, there is a 20% residual stenosis.  Lesion #3: RPDA lesion is 90% stenosed.  A drug-eluting stent was successfully placed using a STENT RESOLUTE ONYX 2.5X15. -Postdilated 2.6 mm  Post intervention, there is a 0% residual stenosis.   SUMMARY  Severe two-3-vessel CAD involving 85% ostial 1st Diag & 90% ostial rPDA and borderline mid LCx 65% lesion (DFR 0.92, FFR 0.82--not physiologically significant) ? Successful scoring balloon angioplasty of ostial 1st Diag -reducing lesion to 20% ? Scoring balloon Angioplasty and DES PCI of ostial rPDA -Resolute Onyx DES 2.5 mm x 15 mm (2.6 mm)  Normal LVEF and EDP.   RECOMMENDATIONS  With 2 site PCI and PTCA only of the diagonal, would like to monitor overnight to assure no complications post PTCA.  This will also allow [place] to monitor his blood pressure and potentially adjust antianginals.  DC amlodipine and increase back up to 50 mg  of Toprol along with restarting HCTZ  Would start Imdur 30 mg on discharge  Cardiac rehab consult       _____________     History of Present Illness     Luis Guzman is a 73 y.o. 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 At that visit, his beta blocker was reduced for bradycardia and amlodipine was added. OP cath was recommended given concern for class III angina.  Hospital Course    1. Exertional angina/newly diagnosed CAD - He underwent cardiac cath 02/26/19 with 65% Cx (FFR 0.82, not physiologically significant), 85% ostial D1 treated with PTCA, and 90% RPDA treated with PTCA/DES. He had residual 40% prox RCA and 45% mRCA. LVEF was 55-65%. Dr. Ellyn Hack recommended to discontinue amlodipine and increase Toprol back up to 50mg  daily, restart HCTZ and add Imdur upon discharge QPM. Post-cath, patient developed right forearm swelling with discoloration and numbness in his fingers. He was seen by vascular who did not suspect compartment syndrome and recommended supportive care. The patient is feeling much better this AM and vascular appears in tact. He was advised to avoid lifting or heavy exertion with that arm until it has improved. Anticipate bruising will continue for several weeks. Dr. Ellyn Hack recommended he transition his aspirin to nightly, about 30 minutes before Imdur.   2. Essential HTN - will plan to follow on med regimen as outlined.  3. Hyperlipidemia - reported to have been statin intolerant in  the past, he says, to numerous statins. Sent message to schedulers to arrange lipid clinic f/u to consider PCSK9. LDL in 04/2018 was 176.  4. R forearm/hand swelling post-cath - seen by vascular who has signed off, no intervention needed. CBC stable post-cath.  5. Hypokalemia - K 3.0 this AM. This  has ranged 3.3-3.8 recently, on HCTZ. He was given 83meq prior to DC and will start 82meq daily. Consider recheck as OP.  6. 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  The patient feels well this morning. He requested [facility] day rx to [facility] and 90 day rx to [facility] which I have completed. He declined meds-to-bed program. He declined needing a refill on Toprol as he had previously been on the 50mg  dose through [facility]. (The dose change to 25mg  had [facility] but I left a voicemail on their line letting them know to disregard.) The patient requested a brief summary of cath vessel findings on his AVS. He has f/u 7/27 with Dr. Ellyn Hack. Dr. Ellyn Hack has seen and examined the patient today and feels he is stable for discharge.  Consultants: N/A _____________  Discharge Vitals Blood pressure (!) 149/77, pulse 67, temperature 98.3 F (36.8 C), temperature source Oral, resp. rate 16, height 6\' 1"  (1.854 m), weight 85.5 kg, SpO2 98 %.  Filed Weights   02/26/19 0559 02/26/19 1429  Weight: 82.6 kg 85.5 kg  General: Well developed, well nourished WM, in no acute distress Head: Normocephalic, atraumatic, sclera non-icteric, no xanthomas, nares are without discharge. Neck: Negative for carotid bruits. JVP not elevated. Lungs: Clear bilaterally to auscultation without wheezes, rales, or rhonchi. Breathing is unlabored. Heart: RRR S1 S2 without murmurs, rubs, or gallops.  Abdomen: Soft, non-tender, non-distended with normoactive bowel sounds. No rebound/guarding. Extremities: No clubbing or cyanosis. No edema. Distal pedal pulses are 2+ and equal bilaterally. R inner forearm ecchymosis extending about 2.5-3 inches proximally, but mild discoloration up towards elbow - soft, good radial pulse, full ROM, good capillary refill -mild tenderness at bruising Neuro: Alert and oriented X 3. Moves all extremities spontaneously. Psych:  Responds to questions  appropriately with a normal affect.   Labs & Radiologic Studies    CBC Recent Labs    02/27/19 0517  WBC 9.8  HGB 14.3  HCT 42.2  MCV 91.7  PLT 818   Basic Metabolic Panel Recent Labs    02/27/19 0517  NA 140  K 3.0*  CL 106  CO2 27  GLUCOSE 122*  BUN 8  CREATININE 0.92  CALCIUM 8.7*   _____________  No results found. Disposition   Pt is being discharged home today in good condition.  Follow-up Plans & Appointments    Follow-up Information    Leonie Man, MD Follow up.   Specialty: Cardiology Why: Keep follow-up appointment as scheduled below. The office will also call you to arrange a lipid clinic (cholesterol) follow-up as well to discuss PCSK-9 inhibitors. Contact information: [address] [place] [address] [phone]          Discharge Instructions    Amb Referral to Cardiac Rehabilitation   Complete by: As directed    Diagnosis: Coronary Stents   After initial evaluation and assessments completed: Virtual Based Care may be provided alone or in conjunction with Phase 2 Cardiac Rehab based on patient barriers.: Yes   Diet - low sodium heart healthy   Complete by: As directed  Discharge instructions   Complete by: As directed    Diclofenac was removed from your medicine list as you indicated you are no longer taking this.  Dr. Ellyn Hack recommends to gradually move your aspirin to bedtime, to take about a half hour before the isosorbide. You were also started on potassium supplement for low potassium level, as well as a new blood thinner called clopidogrel/Plavix to take in addition to your baby aspirin. We sent in your new isosorbide, clopidogrel (Plavix) and potassium prescriptions electronically ([facility] and [facility]). The aspirin is available over the counter, so a prescription was not sent in.  Dr. Ellyn Hack also recommends you stop amlodipine and resume metoprolol at the higher previous 50mg  daily dose.  Per our discussion, we did not send the metoprolol into [facility] since you indicated they had the previous prescription on file.  Your cardiac cath showed 2 blockages that required intervention - an 85% blockage in the first diagonal branch (treated with balloon angioplasty) and 90% RPDA blockage (treated with angioplasty + stent). You also had a 65% blockage in the circumflex artery which Dr. Ellyn Hack measured for significance and not felt to be impacting the blood flow at this time. You had a 40% blockage in the proximal right coronary artery and 45% blockage in the mid right coronary artery. These will be treated with medicines as discussed.   Increase activity slowly   Complete by: As directed    No driving for 3 days - you may return when your arm feels better and you are able to move it without pain. No lifting over 5 lbs for 1 week. No sexual activity for 1 week. You may return to work in August as planned if feeling well. Keep procedure site clean & dry. If you notice increased pain, swelling, bleeding or pus, call/return!  You may shower, but no soaking baths/hot tubs/pools for 1 week.      Discharge Medications   Allergies as of 02/27/2019      Reactions   Penicillins Itching   Has patient had a PCN reaction causing immediate rash, facial/tongue/throat swelling, SOB or lightheadedness with hypotension: no Has patient had a PCN reaction causing severe rash involving mucus membranes or skin necrosis: unkn Has patient had a PCN reaction that required hospitalization: no Has patient had a PCN reaction occurring within the last 10 years: no If all of the above answers are "NO", then may proceed with Cephalosporin use.      Medication List    STOP taking these medications   amLODipine 2.5 MG tablet Commonly known as: NORVASC   diclofenac sodium 1 % Gel Commonly known as: Voltaren     TAKE these medications   aspirin EC 81 MG tablet Take 1 tablet (81 mg total) by mouth every  evening. What changed: when to take this   baclofen 10 MG tablet Commonly known as: LIORESAL Take 10 mg by mouth daily as needed for muscle spasms.   bimatoprost 0.03 % ophthalmic solution Commonly known as: LUMIGAN Place 1 drop into both eyes at bedtime.   clopidogrel 75 MG tablet Commonly known as: PLAVIX Take 1 tablet (75 mg total) by mouth daily.   hydrochlorothiazide 12.5 MG capsule Commonly known as: MICROZIDE TAKE 1 CAPSULE BY MOUTH  DAILY   HYDROCORTISONE EX Apply 1 application topically 2 (two) times daily as needed (insect bites/itching).   isosorbide mononitrate 30 MG 24 hr tablet Commonly known as: IMDUR Take 1 tablet (30  mg total) by mouth every evening.   metoprolol succinate 50 MG 24 hr tablet Commonly known as: TOPROL-XL Take 1 tablet (50 mg total) by mouth daily. What changed:   how much to take  how to take this  when to take this  additional instructions   nitroGLYCERIN 0.4 MG SL tablet Commonly known as: NITROSTAT Place 1 tablet (0.4 mg total) under the tongue every 5 (five) minutes as needed for chest pain.   potassium chloride SA 20 MEQ tablet Commonly known as: K-DUR Take 1 tablet (20 mEq total) by mouth daily. Start taking on: February 28, 2019   valsartan 160 MG tablet Commonly known as: Diovan Take 1 tablet (160 mg total) by mouth daily.        Allergies:  Allergies  Allergen Reactions  . Penicillins Itching    Has patient had a PCN reaction causing immediate rash, facial/tongue/throat swelling, SOB or lightheadedness with hypotension: no Has patient had a PCN reaction causing severe rash involving mucus membranes or skin necrosis: unkn Has patient had a PCN reaction that required hospitalization: no Has patient had a PCN reaction occurring within the last 10 years: no If all of the above answers are "NO", then may proceed with Cephalosporin use.    Outstanding Labs/Studies   Consider BMET as OP to f/u K  Duration of  Discharge Encounter   Greater than 30 minutes including physician time.  Signed, Nedra Hai Anson Peddie PA-C 02/27/2019, 10:22 AM

## 2019-02-27 NOTE — Progress Notes (Signed)
Pt is interested in participating in Virtual Cardiac Rehab. Pt advised that Virtual Cardiac Rehab is provided at no cost to the patient.  Checklist:  1. Pt has smart device  ie smartphone and/or ipad for downloading an app  Yes 2. Reliable internet/wifi service    Yes 3. Understands how to use their smartphone and navigate within an app.  Yes   Reviewed with pt the scheduling process for virtual cardiac rehab.  Pt verbalized understanding.

## 2019-02-27 NOTE — Progress Notes (Signed)
CARDIAC REHAB PHASE I   PRE:  Rate/Rhythm: 66 SR  BP:  Sitting: 142/82        SaO2: 96 RA  MODE:  Ambulation: 470 ft   POST:  Rate/Rhythm: 93 SR  BP:  Sitting: 162/79        SaO2: 98 RA  0832 - 0930  Pt ambulated independently 470 ft. Gait was strong. Pt denies CP and SOB. Pt educated on stent card, restrictions, NTG usage, plavix and ASA, nutrition ([place]), and exercise. Pt referred to [facility] at [facility]. Pt interested in participating in the virtual cardiac rehab app. Pt in bed with call bell within reach.   Philis Kendall, MS, ACSM CEP 02/27/2019 9:25 AM

## 2019-02-28 ENCOUNTER — Telehealth (HOSPITAL_COMMUNITY): Payer: Self-pay

## 2019-02-28 NOTE — Telephone Encounter (Signed)
Called patient to see if he is interested in the Cardiac Rehab Program. Patient expressed interest. Explained scheduling process and went over insurance, patient verbalized understanding. Will contact patient for scheduling once f/u has been completed.  °

## 2019-02-28 NOTE — Telephone Encounter (Signed)
Pt insurance is active and benefits verified through Medicare A/B. Co-pay $0.00, DED $198.00/$198.00 met, out of pocket $0.00/$0.00 met, co-insurance 20%. No pre-authorization required. Passport, 02/28/2019 @ 2:59PM, AJG#[id number]  2ndary insurance is active and benefits verified through [place]. Co-pay $0.00, DED $0.00/$0.00 met, out of pocket $0.00/$0.00 met, co-insurance 0%. No pre-authorization required. Passport, 02/28/2019 @ 2:02PM, BUL#[id number]  Will contact patient to see if he is interested in the Cardiac Rehab Program. If interested, patient will need to complete follow up appt. Once completed, patient will be contacted for scheduling upon review by the RN Navigator.

## 2019-03-11 ENCOUNTER — Encounter: Payer: Self-pay | Admitting: Cardiology

## 2019-03-11 ENCOUNTER — Telehealth: Payer: Self-pay | Admitting: Cardiology

## 2019-03-11 ENCOUNTER — Ambulatory Visit (INDEPENDENT_AMBULATORY_CARE_PROVIDER_SITE_OTHER): Payer: Medicare Other | Admitting: Cardiology

## 2019-03-11 ENCOUNTER — Other Ambulatory Visit: Payer: Self-pay

## 2019-03-11 VITALS — BP 108/78 | HR 65 | Temp 97.3°F | Ht 73.0 in | Wt 186.4 lb

## 2019-03-11 DIAGNOSIS — E785 Hyperlipidemia, unspecified: Secondary | ICD-10-CM

## 2019-03-11 DIAGNOSIS — I10 Essential (primary) hypertension: Secondary | ICD-10-CM

## 2019-03-11 DIAGNOSIS — I2510 Atherosclerotic heart disease of native coronary artery without angina pectoris: Secondary | ICD-10-CM

## 2019-03-11 DIAGNOSIS — E782 Mixed hyperlipidemia: Secondary | ICD-10-CM | POA: Diagnosis not present

## 2019-03-11 DIAGNOSIS — I209 Angina pectoris, unspecified: Secondary | ICD-10-CM | POA: Diagnosis not present

## 2019-03-11 DIAGNOSIS — R233 Spontaneous ecchymoses: Secondary | ICD-10-CM | POA: Diagnosis not present

## 2019-03-11 DIAGNOSIS — Z9861 Coronary angioplasty status: Secondary | ICD-10-CM

## 2019-03-11 MED ORDER — METOPROLOL SUCCINATE ER 50 MG PO TB24: ORAL_TABLET | ORAL | Status: DC

## 2019-03-11 NOTE — Patient Instructions (Addendum)
Medication Instructions:  - DECREASE TOPROL TO 1/2 TABLET OF 50 MG DAILY AT DINNER TIME  - STOP IMDUR (  ISOSORBIDE MN)   --MAY TAKE UP TO  1000 MG  OF TYLENOL( GENERIC  TWICE A DAY If you need a refill on your cardiac medications before your next appointment, please call your pharmacy.   Lab work: NOT NEEDED  Testing/Procedures: NOT NEEDED  Follow-Up: At [facility], you and your health needs are our priority.  As part of our continuing mission to provide you with exceptional heart care, we have created designated Provider Care Teams.  These Care Teams include your primary Cardiologist (physician) and Advanced Practice Providers (APPs -  Physician Assistants and Nurse Practitioners) who all work together to provide you with the care you need, when you need it. . You will need a follow up appointment in  [place] months.  Please call our office 2 months in advance to schedule this appointment.  You may see Glenetta Hew, MD or one of the following Advanced Practice Providers on your designated Care Team:   . Rosaria Ferries, PA-C . Jory Sims, DNP, ANP  Any Other Special Instructions Will Be Listed Below (If Applicable).   USE WARM COMPRESS OF  BRUISED ARM    You have been referred to  [place]

## 2019-03-11 NOTE — Progress Notes (Signed)
PCP: Eulas Post, MD  Clinic Note: Chief Complaint  Patient presents with  . Hospitalization Follow-up    Post cath c/o right arm/wrist pain w/bruising and c/o heat exhaustion . Meds reviewed verbally with pt.  . Coronary Artery Disease    PTCA having PCI.    HPI: GEOVANNIE VILAR is a 73 y.o. male with past medical history of hypertension, hyperlipidemia & now recent Dx CAD-PCI for Progressive Angina who presents for Post-Cath-PCI f/u. Initially seen 7/2 at the request of Eulas Post, MD.  VIKRANT PRYCE was just seen on July 2 in consultation with complaints of intermittent chest discomfort and shortness of breath with exertion starting in March. -> planned Cath.  Recent Hospitalizations:   7/14-15 for Cath --> post-cath course was complicated by pretty significant right forearm hematoma required 3 iterations of blood pressure cuff application for hemostasis.  Studies Personally Reviewed - (if available, images/films reviewed: From Epic Chart or Care Everywhere)  Cath-PCI 02/26/2019: EF 55-65%. mCx 65% (FFR 0.82 - Med Rx). D2 85% - Wolverine Scoring Balloon PTCA (2.0 mm). Ost rPDA - 90% -Resolute Onyx 2.5 x 15 (2.6 mm).   Had R arm hematoma issues  D/c'd amlodpine, start Imdur 30 mg & go back to Toprol 50 mg + HCTZ 25 mmHg  Interval History: Galileo is here for follow-up.  He has not had any more angina, but is a little bit frustrated by the fact that he has not been on the get in the his routine activity levels.  Mostly stating just feeling tired and fatigued.  Just not as much energy as he would have hoped.  Definitely not having the chest pain however.  He does say that his right arm is hurting him quite a bit mostly in the medial aspect around the elbow.  It is very difficult sometimes to get his arm comfortable at night to sleep.  Otherwise no real post-cath issues.   The other thing he describes is a pretty significant splitting headache ever since the cath. He also  describes feeling a little bit lethargic and dizzy especially when he first gets going in the morning.  He has been walking about 15 minutes at a time at least twice a day and doing okay, but he really has not felt like doing more.  No real dyspnea with rest or exertion.  No PND, orthopnea edema. No rapid irregular heartbeats palpitations. No syncope/near syncope or TIA/amaurosis fugax. No claudication.   ROS: A comprehensive was performed. Review of Systems  Constitutional: Positive for malaise/fatigue. Negative for weight loss.  HENT: Negative for congestion and nosebleeds.   Respiratory: Negative for cough.   Cardiovascular: Negative for claudication.  Gastrointestinal: Positive for diarrhea and nausea. Negative for blood in stool and melena.       -- long standing Nausea & diarrhea (despite cholecystectomy); Nausea worse x last month  Genitourinary: Negative for dysuria, frequency and hematuria.  Musculoskeletal: Positive for joint pain (Right arm hurts at the hematoma site right near the elbow).  Neurological: Positive for headaches (Since his cath). Negative for dizziness, tingling, focal weakness and weakness.  Endo/Heme/Allergies: Does not bruise/bleed easily (Not really easily bruising, but just has pretty significant bruise along the forearm to the elbow).  Psychiatric/Behavioral: Negative.   All other systems reviewed and are negative.  The patient does not have symptoms concerning for COVID-19 infection (fever, chills, cough, or new shortness of breath).  The patient is practicing social distancing.   COVID-19 Education:  The signs and symptoms of COVID-19 were discussed with the patient and how to seek care for testing (follow up with PCP or arrange E-visit).   The importance of social distancing was discussed today.  I have reviewed and (if needed) personally updated the patient's problem list, medications, allergies, past medical and surgical history, social and family  history.   Past Medical History:  Diagnosis Date  . CAD S/P PTCA & DES PCI - for Progressive Angina 02/26/2019   Cath-PCI 02/26/2019: EF 55-65%. mCx 65% (FFR 0.82 - Med Rx). D2 85% - Wolverine Scoring Balloon PTCA (2.0 mm). Ost rPDA - 90% -Resolute Onyx 2.5 x 15 (2.6 mm).   . GERD (gastroesophageal reflux disease)   . History of hiatal hernia    40 yrs ago  . HYPERLIPIDEMIA 01/26/2009  . HYPERTENSION 01/26/2009  . 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 lower back pain, s/p cervical neck surgery"stiffness" remains  . Transfusion history    infant "anemia"    Past Surgical History:  Procedure Laterality Date  . APPENDECTOMY  ~ 1959  . BACK SURGERY    . CERVICAL DISC ARTHROPLASTY N/A 07/30/2018   Procedure: Cervical five-six Cervical six-seven Artificial disc replacement;  Surgeon: Kristeen Miss, MD;  Location: [facility];  Service: Neurosurgery;  Laterality: N/A;  . CERVICAL LAMINECTOMY  1997   C 4 and C 5  . CHOLECYSTECTOMY N/A 04/15/2014   Procedure: LAPAROSCOPIC CHOLECYSTECTOMY WITH INTRAOPERATIVE CHOLANGIOGRAM;  Surgeon: Armandina Gemma, MD;  Location: [facility];  Service: General;  Laterality: N/A;  . COLONOSCOPY    . CORONARY BALLOON ANGIOPLASTY N/A 02/26/2019   Procedure: CORONARY BALLOON ANGIOPLASTY;  Surgeon: Leonie Man, MD;  Location: [facility];  Service: Cardiovascular -> scoring balloon PTCA (Wolverine 2.0 mm) of ostial D2 85% reducing to 20-30%.  . CORONARY STENT INTERVENTION N/A 02/26/2019   Procedure: CORONARY STENT INTERVENTION;  Surgeon: Leonie Man, MD;  Location: [facility];; PCI ostial RPDA (initial attempt of PTCA only led to small local tear/dissection covered with stent) Resolute Onyx 2.5 mm x 15 mm (2.6 mm  . ESOPHAGOGASTRODUODENOSCOPY N/A 03/13/2014   Procedure: ESOPHAGOGASTRODUODENOSCOPY (EGD);  Surgeon: Wonda Horner, MD;  Location: [facility];  Service: Endoscopy;  Laterality: N/A;  . INTRAVASCULAR PRESSURE WIRE/FFR STUDY N/A  02/26/2019   Procedure: INTRAVASCULAR PRESSURE WIRE/FFR STUDY;  Surgeon: Leonie Man, MD;  Location: [facility];  Service: Cardiovascular;; mCx ~65% - DFR 0.92, FR 0.82 - BORDERLINE--> MED Rx  . LEFT HEART CATH AND CORONARY ANGIOGRAPHY N/A 02/26/2019   Procedure: LEFT HEART CATH AND CORONARY ANGIOGRAPHY;  Surgeon: Leonie Man, MD;  Location: [facility];  EF 55-65%. mCx 65% (FFR 0.82 - Med Rx). D2 85% - Wolverine Scoring Balloon PTCA (2.0 mm). Ost rPDA - 90% -Resolute Onyx 2.5 x 15 (2.6 mm).   . 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  . SHOULDER ARTHROSCOPY Bilateral   . SHOULDER ARTHROSCOPY W/ ROTATOR CUFF REPAIR Bilateral 2004-2009   left 2004, right 2009  . VASECTOMY    . WISDOM TOOTH EXTRACTION      Current Meds  Medication Sig  . aspirin EC 81 MG tablet Take 1 tablet (81 mg total) by mouth every evening.  . baclofen (LIORESAL) 10 MG tablet Take 10 mg by mouth daily as needed for muscle spasms.  . bimatoprost (LUMIGAN) 0.03 % ophthalmic solution Place 1 drop into both eyes at bedtime.  . clopidogrel (PLAVIX) 75  MG tablet Take 1 tablet (75 mg total) by mouth daily.  . hydrochlorothiazide (MICROZIDE) 12.5 MG capsule TAKE 1 CAPSULE BY MOUTH  DAILY  . HYDROCORTISONE EX Apply 1 application topically 2 (two) times daily as needed (insect bites/itching).  . isosorbide mononitrate (IMDUR) 30 MG 24 hr tablet Take 1 tablet (30 mg total) by mouth every evening.  . metoprolol succinate (TOPROL-XL) 50 MG 24 hr tablet TAKE 1/2 TABLET OF 50 MG  AT DINERTIME DAILY  . nitroGLYCERIN (NITROSTAT) 0.4 MG SL tablet Place 1 tablet (0.4 mg total) under the tongue every 5 (five) minutes as needed for chest pain.  . potassium chloride SA (K-DUR) 20 MEQ tablet Take 1 tablet (20 mEq total) by mouth daily.  . valsartan (DIOVAN) 160 MG tablet Take 1 tablet (160 mg total) by mouth daily.  . [DISCONTINUED] metoprolol succinate (TOPROL-XL) 50 MG 24 hr tablet Take 1 tablet (50 mg  total) by mouth daily.    Allergies  Allergen Reactions  . Penicillins Itching    Has patient had a PCN reaction causing immediate rash, facial/tongue/throat swelling, SOB or lightheadedness with hypotension: no Has patient had a PCN reaction causing severe rash involving mucus membranes or skin necrosis: unkn Has patient had a PCN reaction that required hospitalization: no Has patient had a PCN reaction occurring within the last 10 years: no If all of the above answers are "NO", then may proceed with Cephalosporin use.     Social History   Tobacco Use  . Smoking status: Former Smoker    Packs/day: 1.00    Years: 5.00    Pack years: 5.00    Types: Cigarettes    Quit date: 10/17/1968    Years since quitting: 50.4  . Smokeless tobacco: Never Used  Substance Use Topics  . Alcohol use: Not Currently    Comment: "quit drinking ~ 10/1968  . Drug use: No   Social History   Social History Narrative  . Not on file    family history includes Arthritis in his sister; Hypertension in his father and mother; Stroke (age of onset: 56) in his sister.  Wt Readings from Last 3 Encounters:  03/11/19 186 lb 6 oz (84.5 kg)  02/26/19 188 lb 8 oz (85.5 kg)  02/14/19 189 lb (85.7 kg)    PHYSICAL EXAM BP 108/78 (BP Location: Left Arm, Patient Position: Sitting, Cuff Size: Normal)   Pulse 65   Temp (!) 97.3 F (36.3 C)   Ht 6\' 1"  (1.854 m)   Wt 186 lb 6 oz (84.5 kg)   SpO2 98%   BMI 24.59 kg/m  Physical Exam  Constitutional: He is oriented to person, place, and time. He appears well-developed and well-nourished. No distress.  Healthy appearing gentleman.  Well-groomed.  HENT:  Head: Normocephalic and atraumatic.  Eyes: Pupils are equal, round, and reactive to light. Conjunctivae are normal. No scleral icterus.  Neck: Normal range of motion. Neck supple. No hepatojugular reflux and no JVD present. Carotid bruit is not present.  Cardiovascular: Regular rhythm, normal heart sounds,  intact distal pulses and normal pulses.  No extrasystoles are present. Bradycardia present. PMI is not displaced. Exam reveals no gallop and no friction rub.  No murmur heard. Pulmonary/Chest: Effort normal and breath sounds normal. No respiratory distress. He has no wheezes.  Abdominal: Soft. Bowel sounds are normal. He exhibits no distension. There is no abdominal tenderness. There is no rebound.  Musculoskeletal: Normal range of motion.        General:  No edema.  Neurological: He is alert and oriented to person, place, and time. No cranial nerve deficit.  Skin: Skin is warm and dry. No rash noted. No erythema.  Psychiatric: He has a normal mood and affect. His behavior is normal. Judgment and thought content normal.  Vitals reviewed.    Adult ECG Report  Rate: 51 ;  Rhythm: sinus bradycardia; normal axis, intervals and durations.  Narrative Interpretation: Only sinus bradycardia.   Other studies Reviewed: Additional studies/ records that were reviewed today include:  Recent Labs:   Lab Results  Component Value Date   CHOL 239 (H) 05/02/2018   HDL 42.80 05/02/2018   LDLCALC 176 (H) 05/02/2018   LDLDIRECT 129.0 08/19/2016   TRIG 103.0 05/02/2018   CHOLHDL 6 05/02/2018   Lab Results  Component Value Date   CREATININE 0.92 02/27/2019   BUN 8 02/27/2019   NA 140 02/27/2019   K 3.0 (L) 02/27/2019   CL 106 02/27/2019   CO2 27 02/27/2019   Lab Results  Component Value Date   WBC 9.8 02/27/2019   HGB 14.3 02/27/2019   HCT 42.2 02/27/2019   MCV 91.7 02/27/2019   PLT 194 02/27/2019   Lab Results  Component Value Date   HGBA1C 6.0 05/02/2018    ASSESSMENT / PLAN: Problem List Items Addressed This Visit    Hyperlipidemia with target LDL less than 70 - statin intolerant - Primary (Chronic)    Will need to have labs checked and follow-up with our [facility] our lipid clinic.  --> If PCP has not checked labs recently, we will go and check lipid panel.      Relevant Medications    metoprolol succinate (TOPROL-XL) 50 MG 24 hr tablet   Hematoma of forearm - > related to heart catheterization   Essential hypertension (Chronic)    Not really all that hypertensive.  Well-controlled on Diovan and Toprol plus HCTZ.  I am little worried about him being hypotensive and and dizzy/fatigue. Plan: Reduce Toprol to 25 mg again. DC Imdur.  Low threshold to stop HCTZ and only use as needed      Relevant Medications   metoprolol succinate (TOPROL-XL) 50 MG 24 hr tablet   Other Relevant Orders   EKG 12-Lead (Completed)   AMB referral to cardiac rehabilitation   CAD S/P PTCA & DES PCI - for Progressive Angina (Chronic)    PTCA of diagonal and DES PCI of PDA with med management of circumflex lesion. On aspirin plus Plavix -> minimum of 6 months (from early July 2020), at which point (by February 2021) we would be okay to hold Plavix as necessary. On Toprol, but reducing dose to 25 mg. Not on statin, will refer to lipid clinic -> statin intolerant in the past.      Relevant Medications   metoprolol succinate (TOPROL-XL) 50 MG 24 hr tablet   Other Relevant Orders   EKG 12-Lead (Completed)   AMB referral to cardiac rehabilitation   Angina, class III (HCC) (Chronic)    No longer having pain after diagonal PTCA and PDA DES PCI.  We can be treating the circumflex disease medically.  No longer having symptoms so we will discontinue Imdur.  Did not tolerate amlodipine, so he was started back on the metoprolol 50. However with him being fatigue, will reduce Toprol to 25 mg.      Relevant Medications   metoprolol succinate (TOPROL-XL) 50 MG 24 hr tablet   Other Relevant Orders   EKG 12-Lead (Completed)  AMB referral to cardiac rehabilitation      I spent a total of 25 minutes with the patient and chart review. >  50% of the time was spent in direct patient consultation.  Additional 10 minutes spent with chart review and charting.  Current medicines are reviewed at length with  the patient today.  (+/- concerns) none Patient Instructions  Medication Instructions:  - DECREASE TOPROL TO 1/2 TABLET OF 50 MG DAILY AT DINNER TIME  - STOP IMDUR (  ISOSORBIDE MN)   --MAY TAKE UP TO  1000 MG  OF TYLENOL( GENERIC  TWICE A DAY If you need a refill on your cardiac medications before your next appointment, please call your pharmacy.   Lab work: NOT NEEDED  Testing/Procedures: NOT NEEDED  Follow-Up: At [facility], you and your health needs are our priority.  As part of our continuing mission to provide you with exceptional heart care, we have created designated Provider Care Teams.  These Care Teams include your primary Cardiologist (physician) and Advanced Practice Providers (APPs -  Physician Assistants and Nurse Practitioners) who all work together to provide you with the care you need, when you need it. . You will need a follow up appointment in  [place] months.  Please call our office 2 months in advance to schedule this appointment.  You may see Glenetta Hew, MD or one of the following Advanced Practice Providers on your designated Care Team:   . Rosaria Ferries, PA-C . Jory Sims, DNP, ANP  Any Other Special Instructions Will Be Listed Below (If Applicable).   USE WARM COMPRESS OF  BRUISED ARM    You have been referred to  CARDIAC REHAB    Orders Placed This Encounter  Procedures  . AMB referral to cardiac rehabilitation  . EKG 12-Lead      Glenetta Hew, M.D., M.S. Interventional Cardiologist   Pager # [phone] Phone # [phone] [address] [place], [place] [address]   Thank you for choosing Heartcare at [facility]!!

## 2019-03-11 NOTE — Telephone Encounter (Signed)
° ° °  COVID-19 Pre-Screening Questions:   In the past 7 to 10 days have you had a cough,  shortness of breath, headache, congestion, fever (100 or greater) body aches, chills, sore throat, or sudden loss of taste or sense of smell? no  Have you been around anyone with known Covid 19.  Have you been around anyone who is awaiting Covid 19 test results in the past 7 to 10 days? No   Have you been around anyone who has been exposed to Covid 19, or has mentioned symptoms of Covid 19 within the past 7 to 10 days? no If you have any concerns/questions about symptoms patients report during screening (either on the phone or at threshold). Contact the provider seeing the patient or DOD for further guidance.  If neither are available contact a member of the leadership team.

## 2019-03-12 ENCOUNTER — Encounter: Payer: Self-pay | Admitting: Cardiology

## 2019-03-12 DIAGNOSIS — R233 Spontaneous ecchymoses: Secondary | ICD-10-CM | POA: Insufficient documentation

## 2019-03-12 NOTE — Assessment & Plan Note (Signed)
PTCA of diagonal and DES PCI of PDA with med management of circumflex lesion. On aspirin plus Plavix -> minimum of 6 months (from early July 2020), at which point (by February 2021) we would be okay to hold Plavix as necessary. On Toprol, but reducing dose to 25 mg. Not on statin, will refer to lipid clinic -> statin intolerant in the past.

## 2019-03-12 NOTE — Assessment & Plan Note (Signed)
No longer having pain after diagonal PTCA and PDA DES PCI.  We can be treating the circumflex disease medically.  No longer having symptoms so we will discontinue Imdur.  Did not tolerate amlodipine, so he was started back on the metoprolol 50. However with him being fatigue, will reduce Toprol to 25 mg.

## 2019-03-12 NOTE — Assessment & Plan Note (Addendum)
Not really all that hypertensive.  Well-controlled on Diovan and Toprol plus HCTZ.  I am little worried about him being hypotensive and and dizzy/fatigue. Plan: Reduce Toprol to 25 mg again. DC Imdur.  Low threshold to stop HCTZ and only use as needed

## 2019-03-12 NOTE — Assessment & Plan Note (Signed)
Will need to have labs checked and follow-up with our [facility] our lipid clinic.  --> If PCP has not checked labs recently, we will go and check lipid panel.

## 2019-03-18 ENCOUNTER — Telehealth: Payer: Self-pay | Admitting: Orthopaedic Surgery

## 2019-03-18 NOTE — Telephone Encounter (Signed)
Ok to order 

## 2019-03-18 NOTE — Telephone Encounter (Signed)
Patient had an injection in rt hip at [facility] several months ago that helped. Patient now having increased pain in lt hip. Patient would like to know if doctor would order an injection for his lt hip? Please call to advise.

## 2019-03-18 NOTE — Telephone Encounter (Signed)
Please advise 

## 2019-03-18 NOTE — Telephone Encounter (Signed)
Please order. Thank you. 

## 2019-03-19 ENCOUNTER — Other Ambulatory Visit: Payer: Self-pay | Admitting: Orthopaedic Surgery

## 2019-03-19 ENCOUNTER — Other Ambulatory Visit: Payer: Self-pay | Admitting: Orthopedic Surgery

## 2019-03-19 ENCOUNTER — Telehealth (HOSPITAL_COMMUNITY): Payer: Self-pay

## 2019-03-19 DIAGNOSIS — M25552 Pain in left hip: Secondary | ICD-10-CM

## 2019-03-19 NOTE — Telephone Encounter (Signed)
Called and spoke with patient. Order for left hip injection has been placed.

## 2019-03-19 NOTE — Telephone Encounter (Signed)
Called patient to see if he was interested in participating in the Cardiac Rehab Program. Patient stated yes. Patient will come in for orientation on 04/04/2019 and will attend the 10:45am exercise class.  Mailed homework package.

## 2019-03-21 ENCOUNTER — Telehealth: Payer: Self-pay | Admitting: *Deleted

## 2019-03-21 NOTE — Telephone Encounter (Signed)
The patient called in stating that he never got an answer to his MyChart message.  He has been advised of Dr. Allison Quarry recommnedations and to keep the office updated:  I would say stop the aspirin for a week or 2 and see if that actually helps. If it does want to get through the initial stages, see how you do going back on aspirin every other day.   Glenetta Hew, MD  He also stated that he needed to have an injection for pain into his left hip. The clearance has been faxed to our office.

## 2019-03-22 ENCOUNTER — Telehealth: Payer: Self-pay

## 2019-03-22 NOTE — Telephone Encounter (Signed)
   [facility] Pre-operative Risk Assessment    Request for surgical clearance:  1. What type of surgery is being performed? LEFT HIP INJECTION   2. When is this surgery scheduled? TBD   3. What type of clearance is required (medical clearance vs. Pharmacy clearance to hold med vs. Both)? MEDICATION  4. Are there any medications that need to be held prior to surgery and how long? PLAVIX 5 DAYS  5. Practice name and name of physician performing surgery? [facility]    6. What is your office phone number  [phone]    7.   What is your office fax number  [phone]  8.   Anesthesia type (None, local, MAC, general) ? NOT LISTED   Luis Guzman 03/22/2019, 7:31 AM  _________________________________________________________________   (provider comments below)

## 2019-03-25 ENCOUNTER — Other Ambulatory Visit: Payer: Self-pay

## 2019-03-25 ENCOUNTER — Ambulatory Visit (INDEPENDENT_AMBULATORY_CARE_PROVIDER_SITE_OTHER): Payer: Medicare Other | Admitting: Pharmacist

## 2019-03-25 VITALS — BP 142/90 | HR 72 | Ht 73.0 in | Wt 190.4 lb

## 2019-03-25 DIAGNOSIS — E785 Hyperlipidemia, unspecified: Secondary | ICD-10-CM | POA: Diagnosis not present

## 2019-03-25 DIAGNOSIS — I209 Angina pectoris, unspecified: Secondary | ICD-10-CM | POA: Diagnosis not present

## 2019-03-25 NOTE — Patient Instructions (Signed)
[facility] (pharmacist) [phone] Esteven Overfelt/Kristin  *START paperwork for Repatha Sureclick 140mg  * *Continue positive lifestyle modification*    High Cholesterol  High cholesterol is a condition in which the blood has high levels of a white, waxy, fat-like substance (cholesterol). The human body needs small amounts of cholesterol. The liver makes all the cholesterol that the body needs. Extra (excess) cholesterol comes from the food that we eat. Cholesterol is carried from the liver by the blood through the blood vessels. If you have high cholesterol, deposits (plaques) may build up on the walls of your blood vessels (arteries). Plaques make the arteries narrower and stiffer. Cholesterol plaques increase your risk for heart attack and stroke. Work with your health care provider to keep your cholesterol levels in a healthy range. What increases the risk? This condition is more likely to develop in people who:  Eat foods that are high in animal fat (saturated fat) or cholesterol.  Are overweight.  Are not getting enough exercise.  Have a family history of high cholesterol. What are the signs or symptoms? There are no symptoms of this condition. How is this diagnosed? This condition may be diagnosed from the results of a blood test.  If you are older than age 73, your health care provider may check your cholesterol every 4-6 years.  You may be checked more often if you already have high cholesterol or other risk factors for heart disease. The blood test for cholesterol measures:  "Bad" cholesterol (LDL cholesterol). This is the main type of cholesterol that causes heart disease. The desired level for LDL is less than 100.  "Good" cholesterol (HDL cholesterol). This type helps to protect against heart disease by cleaning the arteries and carrying the LDL away. The desired level for HDL is 60 or higher.  Triglycerides. These are fats that the body can store or burn for energy. The  desired number for triglycerides is lower than 150.  Total cholesterol. This is a measure of the total amount of cholesterol in your blood, including LDL cholesterol, HDL cholesterol, and triglycerides. A healthy number is less than 200. How is this treated? This condition is treated with diet changes, lifestyle changes, and medicines. Diet changes  This may include eating more whole grains, fruits, vegetables, nuts, and fish.  This may also include cutting back on red meat and foods that have a lot of added sugar. Lifestyle changes  Changes may include getting at least 40 minutes of aerobic exercise 3 times a week. Aerobic exercises include walking, biking, and swimming. Aerobic exercise along with a healthy diet can help you maintain a healthy weight.  Changes may also include quitting smoking. Medicines  Medicines are usually given if diet and lifestyle changes have failed to reduce your cholesterol to healthy levels.  Your health care provider may prescribe a statin medicine. Statin medicines have been shown to reduce cholesterol, which can reduce the risk of heart disease. Follow these instructions at home: Eating and drinking If told by your health care provider:  Eat chicken (without skin), fish, veal, shellfish, ground [place] breast, and round or loin cuts of red meat.  Do not eat fried foods or fatty meats, such as hot dogs and salami.  Eat plenty of fruits, such as apples.  Eat plenty of vegetables, such as broccoli, potatoes, and carrots.  Eat beans, peas, and lentils.  Eat grains such as barley, rice, couscous, and bulgur wheat.  Eat pasta without cream sauces.  Use skim or nonfat milk, and  eat low-fat or nonfat yogurt and cheeses.  Do not eat or drink whole milk, cream, ice cream, egg yolks, or hard cheeses.  Do not eat stick margarine or tub margarines that contain trans fats (also called partially hydrogenated oils).  Do not eat saturated tropical oils, such  as coconut oil and palm oil.  Do not eat cakes, cookies, crackers, or other baked goods that contain trans fats.  General instructions  Exercise as directed by your health care provider. Increase your activity level with activities such as gardening, walking, and taking the stairs.  Take over-the-counter and prescription medicines only as told by your health care provider.  Do not use any products that contain nicotine or tobacco, such as cigarettes and e-cigarettes. If you need help quitting, ask your health care provider.  Keep all follow-up visits as told by your health care provider. This is important. Contact a health care provider if:  You are struggling to maintain a healthy diet or weight.  You need help to start on an exercise program.  You need help to stop smoking. Get help right away if:  You have chest pain.  You have trouble breathing. This information is not intended to replace advice given to you by your health care provider. Make sure you discuss any questions you have with your health care provider. Document Released: 08/01/2005 Document Revised: 08/04/2017 Document Reviewed: 01/30/2016 Elsevier Patient Education  2020 [employer].

## 2019-03-25 NOTE — Telephone Encounter (Signed)
I will route to Dr. Ellyn Hack for further recommendations.  Dr. Ellyn Hack, Patient had [facility] 02/26/19 with balloon angioplasty to 1st Diagonal and DES to rPDA. Per your last note, would not interrupt DAPT for 6 months. Do you have recommendations for interruption prior to this vs pain management strategies for left hip pain?

## 2019-03-25 NOTE — Progress Notes (Signed)
Patient ID: Luis Guzman                 DOB: [birth year]                    MRN: [id number]     HPI: Luis Guzman is a 72 y.o. male patient referred to lipid clinic by Dr. Ellyn Hack. 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  Patient present to clinic for [facility] education and initiation. Reports hip pain awaiting to get spinal block, but denies any other health problems at this time.  Current Medications:  None  Intolerances:  Rosuvastatin 10mg  daily Rosuvastatin 10mg  once weekly Simvastatin 8omg daily pituvastatin 4mg  daily Pituvastatin 2mg  daily  LDL goal: 70mg /dL (< 50 mg/dL ideal)  Diet: working on keeping a low fat diet.  Exercise: limited due to hip pain. Usually walks every day of the week  Family History: family history includes Arthritis in his sister; Hypertension in his father and mother; Stroke (age of onset: 94) in his sister.  Social History: former smoker, ocassional alcohol intake  Labs: 03/25/2019: CHO 242; TG 240; HDL 37; LDL-c 157 (no medication)   Past Medical History:  Diagnosis Date  . CAD S/P PTCA & DES PCI - for Progressive Angina 02/26/2019   Cath-PCI 02/26/2019: EF 55-65%. mCx 65% (FFR 0.82 - Med Rx). D2 85% - [place] Scoring Balloon PTCA (2.0 mm). Ost rPDA - 90% -Resolute Onyx 2.5 x 15 (2.6 mm).   . GERD (gastroesophageal reflux disease)   . History of hiatal hernia    40 yrs ago  . HYPERLIPIDEMIA 01/26/2009  . HYPERTENSION 01/26/2009  . 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 lower back pain, s/p cervical neck surgery"stiffness" remains  . Transfusion history    infant "anemia"    Current Outpatient Medications on File Prior to Visit  Medication Sig Dispense Refill  . aspirin EC 81 MG tablet Take 1 tablet (81 mg total) by mouth every evening.    . baclofen (LIORESAL) 10 MG tablet Take 10 mg by mouth daily as needed for muscle spasms.     . bimatoprost (LUMIGAN) 0.03 % ophthalmic solution Place 1 drop into both eyes at bedtime.    . clopidogrel (PLAVIX) 75 MG tablet Take 1 tablet (75 mg total) by mouth daily. 90 tablet 3  . hydrochlorothiazide (MICROZIDE) 12.5 MG capsule TAKE 1 CAPSULE BY MOUTH  DAILY 90 capsule 3  . HYDROCORTISONE EX Apply 1 application topically 2 (two) times daily as needed (insect bites/itching).    . isosorbide mononitrate (IMDUR) 30 MG 24 hr tablet Take 1 tablet (30 mg total) by mouth every evening. 90 tablet 3  . metoprolol succinate (TOPROL-XL) 50 MG 24 hr tablet TAKE 1/2 TABLET OF 50 MG  AT DINERTIME DAILY    . nitroGLYCERIN (NITROSTAT) 0.4 MG SL tablet Place 1 tablet (0.4 mg total) under the tongue every 5 (five) minutes as needed for chest pain. 20 tablet 0  . potassium chloride SA (K-DUR) 20 MEQ tablet Take 1 tablet (20 mEq total) by mouth daily. 90 tablet 3  . valsartan (DIOVAN) 160 MG tablet Take 1 tablet (160 mg total) by mouth daily. 90 tablet 3   No current facility-administered medications on file prior to visit.     Allergies  Allergen Reactions  . Penicillins Itching    Has patient had a PCN reaction causing immediate rash, facial/tongue/throat swelling, SOB or lightheadedness  with hypotension: no Has patient had a PCN reaction causing severe rash involving mucus membranes or skin necrosis: unkn Has patient had a PCN reaction that required hospitalization: no Has patient had a PCN reaction occurring within the last 10 years: no If all of the above answers are "NO", then may proceed with Cephalosporin use.     Hyperlipidemia with target LDL less than 70 - statin intolerant LDL-c remains extremely elevated for secondary prevention. Patient has history of statin intolerance with failed trial to rosuvastatin, simvastatin, and pituvastatin. He currently follows a low fat diet and reports compliance with all his prescribed medication.  Repatha/Praluent indication, administration, storage,  prior-authorization process, and financial assistance  discussed during this office visit. Repatha SureClick 140mg  sample (Lot [id number]; exp 05/2021) was provided during visit for self-administration. Will initiate process for Repatha 140mg  prior authorization and follow up as needed.      Jonas Goh Rodriguez-Guzman PharmD, BCPS, [place] [id number] 03/26/2019 2:32 PM

## 2019-03-25 NOTE — Telephone Encounter (Signed)
Attempted to contact pt.  Left message to call back.  

## 2019-03-25 NOTE — Telephone Encounter (Signed)
Pt advised that per PA, he would have to continue with plavix until Feb. 2021. Pt state he is in a great deal of pain and would like to know what medication PA would recommend for pain relief.

## 2019-03-25 NOTE — Telephone Encounter (Signed)
   Primary Cardiologist: Glenetta Hew, MD  Chart reviewed as part of pre-operative protocol coverage. Patient was contacted 03/25/2019 in reference to pre-operative risk assessment for pending surgery as outlined below.  Luis Guzman was last seen on 03/11/19 by Dr. Ellyn Hack. Patient had PCI with stent placement on 02/26/19.   Per Dr. Ellyn Hack,  On aspirin plus Plavix -> minimum of 6 months (from early July 2020), at which point (by February 2021) we would be okay to hold Plavix as necessary.    I will route this recommendation to the requesting party via Epic fax function and remove from pre-op pool.  Please call with questions.  [place], PA 03/25/2019, 10:27 AM

## 2019-03-26 ENCOUNTER — Encounter: Payer: Self-pay | Admitting: Pharmacist

## 2019-03-26 LAB — LIPID PANEL
Chol/HDL Ratio: 6.5 ratio — ABNORMAL HIGH (ref 0.0–5.0)
Cholesterol, Total: 242 mg/dL — ABNORMAL HIGH (ref 100–199)
HDL: 37 mg/dL — ABNORMAL LOW (ref >39)
LDL Calculated: 157 mg/dL — ABNORMAL HIGH (ref 0–99)
Triglycerides: 240 mg/dL — ABNORMAL HIGH (ref 0–149)
VLDL Cholesterol Cal: 48 mg/dL — ABNORMAL HIGH (ref 5–40)

## 2019-03-26 NOTE — Assessment & Plan Note (Signed)
LDL-c remains extremely elevated for secondary prevention. Patient has history of statin intolerance with failed trial to rosuvastatin, simvastatin, and pituvastatin. He currently follows a low fat diet and reports compliance with all his prescribed medication.  Repatha/Praluent indication, administration, storage, prior-authorization process, and financial assistance  discussed during this office visit. Repatha SureClick 140mg  sample (Lot [id number]; exp 05/2021) was provided during visit for self-administration. Will initiate process for Repatha 140mg  prior authorization and follow up as needed.

## 2019-03-26 NOTE — Telephone Encounter (Signed)
I think if we can make it 3 months of uninterrupted Plavix, the risk of stent thrombosis may be low enough to hold Plavix.  That would be October.  Just too soon to hold x 5 days unless Emergent.  Until then - I would recommend High Dose Tylenol (& if Ortho will write for narcotic).  He can use Ibuprofen/Indomethacin NSAIDs intermittently - in lieu of ASA.  This was not any issue when I saw him, so had not indication there would be an issue.  Glenetta Hew, MD

## 2019-03-27 ENCOUNTER — Telehealth: Payer: Self-pay

## 2019-03-27 MED ORDER — REPATHA SURECLICK 140 MG/ML ~~LOC~~ SOAJ: 140.0000 mg | SUBCUTANEOUS | 6 refills | Status: DC

## 2019-03-27 NOTE — Addendum Note (Signed)
Addended by: Allean Found on: 03/27/2019 01:36 PM   Modules accepted: Orders

## 2019-03-27 NOTE — Telephone Encounter (Signed)
Called pt back to tell him that the repatha was approved and sent rx

## 2019-03-27 NOTE — Telephone Encounter (Signed)
LAST NIGHT ABOUT MIDNIGHT THE PT WOKE UP HOT AND THREW UP ON 2 OCCASIONS ABOUT AND HOUR APART. PT SAW THAT VOMITING IS A SIDE EFFECT OF THE REPATHA THAT HE JUST STARTED TAKING APPROXIMATELY 36 HRS AGO AND WANTED TO MAKE SURE THAT IT WAS RELATED AND THE PT NEEDS A CALL BACK

## 2019-03-27 NOTE — Telephone Encounter (Signed)
Returned call to patient.  He notes that he had 2 episodes of vomiting last night around midnight - 36 hours after his dose of Repatha.  He isn't sure they are related.    Advised that the manufacturer lists gastroenteritis as a possible side effect, but I have never had a patient complain of vomiting.   He is willing to try again in 2 weeks.  Asked that he call after next dose, to verify with [place] any problems.    Patient voiced understanding.

## 2019-03-28 NOTE — Telephone Encounter (Signed)
Please let the requesting team know that Dr. Ellyn Hack has suggested continuing Plavix for at least 3 months post PCI which would be until 05/29/2019. At that time, it may be acceptable to hold depending on patients symptoms.    Kathyrn Drown NP-C [place] Pager: [phone]

## 2019-03-29 ENCOUNTER — Other Ambulatory Visit: Payer: Self-pay | Admitting: Family Medicine

## 2019-03-29 NOTE — Telephone Encounter (Signed)
Refill for one year 

## 2019-03-29 NOTE — Telephone Encounter (Signed)
Dr. Glenetta Hew filled Metoprololl at some point.  Are you still filling these prescriptions? OK for 6 or 12 months?

## 2019-04-02 ENCOUNTER — Telehealth (HOSPITAL_COMMUNITY): Payer: Self-pay

## 2019-04-02 NOTE — Telephone Encounter (Signed)
Cardiac Rehab Medication Review by a Pharmacist  Does the patient  feel that his/her medications are working for him/her?  yes  Has the patient been experiencing any side effects to the medications prescribed?  Yes Pausing aspirin per MD due to excessive bursing with dual antiplatelet Stopped Isosorbide mononitrate per MD due to headaches   Does the patient measure his/her own blood pressure ?  No  Does the patient have any problems obtaining medications due to transportation or finances?   no  Understanding of regimen: excellent Understanding of indications: excellent Potential of compliance: excellent  Pharmacist comments: N/a  Acey Lav, PharmD  PGY1 Acute Care Pharmacy Resident [phone] 04/02/2019 11:29 AM

## 2019-04-04 ENCOUNTER — Other Ambulatory Visit: Payer: Self-pay

## 2019-04-04 ENCOUNTER — Encounter (HOSPITAL_COMMUNITY)
Admission: RE | Admit: 2019-04-04 | Discharge: 2019-04-04 | Disposition: A | Discharge status: Home / Self Care | Payer: Medicare Other | Source: Ambulatory Visit | Attending: Cardiology | Admitting: Cardiology

## 2019-04-04 VITALS — BP 124/68 | HR 82 | Ht 72.0 in | Wt 188.9 lb

## 2019-04-04 DIAGNOSIS — Z9861 Coronary angioplasty status: Secondary | ICD-10-CM

## 2019-04-04 DIAGNOSIS — Z955 Presence of coronary angioplasty implant and graft: Secondary | ICD-10-CM

## 2019-04-04 NOTE — Progress Notes (Signed)
Cardiac Individual Treatment Plan  Patient Details  Name: Luis Guzman MRN: [id number] Date of Birth: 18-Oct-1945 Referring Provider:     CARDIAC REHAB PHASE II ORIENTATION from 04/04/2019 in [facility]  Referring Provider  Dr. Ellyn Hack       Initial Encounter Date:    CARDIAC REHAB PHASE II ORIENTATION from 04/04/2019 in [facility]  Date  04/04/19      Visit Diagnosis: S/P PTCA (percutaneous transluminal coronary angioplasty)  Status post coronary artery stent placement  Patient's Home Medications on Admission:  Current Outpatient Medications:  .  aspirin EC 81 MG tablet, Take 1 tablet (81 mg total) by mouth every evening. (Patient not taking: Reported on 04/02/2019), Disp: , Rfl:  .  baclofen (LIORESAL) 10 MG tablet, Take 10 mg by mouth daily as needed for muscle spasms., Disp: , Rfl:  .  bimatoprost (LUMIGAN) 0.03 % ophthalmic solution, Place 1 drop into both eyes at bedtime., Disp: , Rfl:  .  clopidogrel (PLAVIX) 75 MG tablet, Take 1 tablet (75 mg total) by mouth daily., Disp: 90 tablet, Rfl: 3 .  Evolocumab (REPATHA SURECLICK) [id number] MG/ML SOAJ, Inject 140 mg into the skin every 14 (fourteen) days., Disp: 2 pen, Rfl: 6 .  hydrochlorothiazide (MICROZIDE) 12.5 MG capsule, TAKE 1 CAPSULE BY MOUTH  DAILY, Disp: 90 capsule, Rfl: 3 .  HYDROCORTISONE EX, Apply 1 application topically 2 (two) times daily as needed (insect bites/itching)., Disp: , Rfl:  .  isosorbide mononitrate (IMDUR) 30 MG 24 hr tablet, Take 1 tablet (30 mg total) by mouth every evening. (Patient not taking: Reported on 04/02/2019), Disp: 90 tablet, Rfl: 3 .  metoprolol succinate (TOPROL-XL) 50 MG 24 hr tablet, TAKE 1 TABLET BY MOUTH  DAILY WITH OR IMMEDIATLEY  FOLLOWING A MEAL (Patient taking differently: 25 mg. TAKE 1 TABLET BY MOUTH  DAILY WITH OR IMMEDIATLEY  FOLLOWING A MEAL), Disp: 90 tablet, Rfl: 3 .  nitroGLYCERIN (NITROSTAT) 0.4 MG SL tablet, Place 1 tablet  (0.4 mg total) under the tongue every 5 (five) minutes as needed for chest pain., Disp: 20 tablet, Rfl: 0 .  potassium chloride SA (K-DUR) 20 MEQ tablet, Take 1 tablet (20 mEq total) by mouth daily., Disp: 90 tablet, Rfl: 3 .  valsartan (DIOVAN) 160 MG tablet, TAKE 1 TABLET BY MOUTH  DAILY, Disp: 90 tablet, Rfl: 3  Past Medical History: Past Medical History:  Diagnosis Date  . CAD S/P PTCA & DES PCI - for Progressive Angina 02/26/2019   Cath-PCI 02/26/2019: EF 55-65%. mCx 65% (FFR 0.82 - Med Rx). D2 85% - [facility] Scoring Balloon PTCA (2.0 mm). Ost rPDA - 90% -Resolute Onyx 2.5 x 15 (2.6 mm).   . GERD (gastroesophageal reflux disease)   . History of hiatal hernia    40 yrs ago  . HYPERLIPIDEMIA 01/26/2009  . HYPERTENSION 01/26/2009  . 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 lower back pain, s/p cervical neck surgery"stiffness" remains  . Transfusion history    infant "anemia"    Tobacco Use: Social History   Tobacco Use  Smoking Status Former Smoker  . Packs/day: 1.00  . Years: 5.00  . Pack years: 5.00  . Types: Cigarettes  . Quit date: 10/17/1968  . Years since quitting: 50.4  Smokeless Tobacco Never Used    Labs: Recent Chemical engineer    Labs for ITP Cardiac and Pulmonary Rehab Latest Ref Rng & Units 04/15/2016 08/19/2016 04/18/2017 05/02/2018 03/25/2019  Cholestrol 100 - 199 mg/dL 245(H) 204(H) - 239(H) 242(H)   LDLCALC 0 - 99 mg/dL 175(H) - - 176(H) 157(H)   LDLDIRECT mg/dL - 129.0 - - -   HDL >39 mg/dL 36.00(L) 30.40(L) - 42.80 37(L)   Trlycerides 0 - 149 mg/dL 169.0(H) 203.0(H) - 103.0 240(H)   Hemoglobin A1c 4.6 - 6.5 % 5.8 5.9 6.2 6.0 -      Capillary Blood Glucose: No results found for: GLUCAP   Exercise Target Goals: Exercise Program Goal: Individual exercise prescription set using results from initial 6 min walk test and THRR while considering  patient's activity barriers and safety.   Exercise Prescription Goal: Initial exercise  prescription builds to 30-45 minutes a day of aerobic activity, 2-3 days per week.  Home exercise guidelines will be given to patient during program as part of exercise prescription that the participant will acknowledge.  Activity Barriers & Risk Stratification: Activity Barriers & Cardiac Risk Stratification - 04/04/19 1132      Activity Barriers & Cardiac Risk Stratification   Activity Barriers  Deconditioning   Hip pain   Cardiac Risk Stratification  High       6 Minute Walk: 6 Minute Walk    Row Name 04/04/19 1131         6 Minute Walk   Phase  Initial     Distance  1634 feet     Walk Time  6 minutes     # of Rest Breaks  0     MPH  3     METS  3.5     RPE  13     Perceived Dyspnea   0     VO2 Peak  12.25     Symptoms  Yes (comment)     Comments  4, Hip Pain     Resting HR  82 bpm     Resting BP  124/68     Resting Oxygen Saturation   97 %     Exercise Oxygen Saturation  during 6 min walk  97 %     Max Ex. HR  100 bpm     Max Ex. BP  128/64     2 Minute Post BP  120/68        Oxygen Initial Assessment:   Oxygen Re-Evaluation:   Oxygen Discharge (Final Oxygen Re-Evaluation):   Initial Exercise Prescription: Initial Exercise Prescription - 04/04/19 1100      Date of Initial Exercise RX and Referring Provider   Date  04/04/19    Referring Provider  Dr. Ellyn Hack     Expected Discharge Date  05/10/19      Treadmill   MPH  2.4    Grade  0    Minutes  15      NuStep   Level  3    SPM  85    Minutes  15    METs  2.9      Prescription Details   Frequency (times per week)  3    Duration  Progress to 30 minutes of continuous aerobic without signs/symptoms of physical distress      Intensity   THRR 40-80% of Max Heartrate  59-118    Ratings of Perceived Exertion  11-13      Progression   Progression  Continue to progress workloads to maintain intensity without signs/symptoms of physical distress.      Resistance Training   Training Prescription   Yes    Weight  4 lbs.     Reps  10-15       Perform Capillary Blood Glucose checks as needed.  Exercise Prescription Changes:   Exercise Comments:   Exercise Goals and Review: Exercise Goals    Row Name 04/04/19 1138             Exercise Goals   Increase Physical Activity  Yes       Intervention  Provide advice, education, support and counseling about physical activity/exercise needs.;Develop an individualized exercise prescription for aerobic and resistive training based on initial evaluation findings, risk stratification, comorbidities and participant's personal goals.       Expected Outcomes  Short Term: Attend rehab on a regular basis to increase amount of physical activity.;Long Term: Add in home exercise to make exercise part of routine and to increase amount of physical activity.;Long Term: Exercising regularly at least 3-5 days a week.       Increase Strength and Stamina  Yes       Intervention  Provide advice, education, support and counseling about physical activity/exercise needs.;Develop an individualized exercise prescription for aerobic and resistive training based on initial evaluation findings, risk stratification, comorbidities and participant's personal goals.       Expected Outcomes  Short Term: Increase workloads from initial exercise prescription for resistance, speed, and METs.;Short Term: Perform resistance training exercises routinely during rehab and add in resistance training at home;Long Term: Improve cardiorespiratory fitness, muscular endurance and strength as measured by increased METs and functional capacity (6MWT)       Able to understand and use rate of perceived exertion (RPE) scale  Yes       Intervention  Provide education and explanation on how to use RPE scale       Expected Outcomes  Short Term: Able to use RPE daily in rehab to express subjective intensity level;Long Term:  Able to use RPE to guide intensity level when exercising independently        Knowledge and understanding of Target Heart Rate Range (THRR)  Yes       Intervention  Provide education and explanation of THRR including how the numbers were predicted and where they are located for reference       Expected Outcomes  Short Term: Able to state/look up THRR;Long Term: Able to use THRR to govern intensity when exercising independently;Short Term: Able to use daily as guideline for intensity in rehab       Able to check pulse independently  Yes       Intervention  Provide education and demonstration on how to check pulse in carotid and radial arteries.;Review the importance of being able to check your own pulse for safety during independent exercise       Expected Outcomes  Short Term: Able to explain why pulse checking is important during independent exercise;Long Term: Able to check pulse independently and accurately       Understanding of Exercise Prescription  Yes       Intervention  Provide education, explanation, and written materials on patient's individual exercise prescription       Expected Outcomes  Short Term: Able to explain program exercise prescription;Long Term: Able to explain home exercise prescription to exercise independently          Exercise Goals Re-Evaluation :   Discharge Exercise Prescription (Final Exercise Prescription Changes):   Nutrition:  Target Goals: Understanding of nutrition guidelines, daily intake of sodium 1500mg , cholesterol 200mg , calories 30% from fat and 7% or  less from saturated fats, daily to have 5 or more servings of fruits and vegetables.  Biometrics: Pre Biometrics - 04/04/19 0800      Pre Biometrics   Waist Circumference  38 inches    Hip Circumference  39.5 inches    Waist to Hip Ratio  0.96 %    Triceps Skinfold  26 mm    % Body Fat  27.9 %    Grip Strength  40 kg    Flexibility  12 in    Single Leg Stand  30 seconds        Nutrition Therapy Plan and Nutrition Goals:   Nutrition  Assessments:   Nutrition Goals Re-Evaluation:   Nutrition Goals Re-Evaluation:   Nutrition Goals Discharge (Final Nutrition Goals Re-Evaluation):   Psychosocial: Target Goals: Acknowledge presence or absence of significant depression and/or stress, maximize coping skills, provide positive support system. Participant is able to verbalize types and ability to use techniques and skills needed for reducing stress and depression.  Initial Review & Psychosocial Screening: Initial Psych Review & Screening - 04/04/19 0916      Initial Review   Current issues with  None Identified      Family Dynamics   Good Support System?  Yes   Luis Guzman spouse is a source of support.     Barriers   Psychosocial barriers to participate in program  There are no identifiable barriers or psychosocial needs.      Screening Interventions   Interventions  Encouraged to exercise       Quality of Life Scores: Quality of Life - 04/04/19 0915      Quality of Life   Select  Quality of Life      Quality of Life Scores   Health/Function Pre  26.8 %    Socioeconomic Pre  29.14 %    Psych/Spiritual Pre  30 %    Family Pre  27.6 %    GLOBAL Pre  28.06 %      Scores of 19 and below usually indicate a poorer quality of life in these areas.  A difference of  2-3 points is a clinically meaningful difference.  A difference of 2-3 points in the total score of the Quality of Life Index has been associated with significant improvement in overall quality of life, self-image, physical symptoms, and general health in studies assessing change in quality of life.  PHQ-9: Recent Review Flowsheet Data    Depression screen [facility] 2/9 04/04/2019 05/02/2018 05/02/2018 04/18/2017 04/15/2016   Decreased Interest 0 0 0 0 0   Down, Depressed, Hopeless 0 0 0 0 0   PHQ - 2 Score 0 0 0 0 0     Interpretation of Total Score  Total Score Depression Severity:  1-4 = Minimal depression, 5-9 = Mild depression, 10-14 = Moderate  depression, 15-19 = Moderately severe depression, 20-27 = Severe depression   Psychosocial Evaluation and Intervention:   Psychosocial Re-Evaluation:   Psychosocial Discharge (Final Psychosocial Re-Evaluation):   Vocational Rehabilitation: Provide vocational rehab assistance to qualifying candidates.   Vocational Rehab Evaluation & Intervention: Vocational Rehab - 04/04/19 0916      Initial Vocational Rehab Evaluation & Intervention   Assessment shows need for Vocational Rehabilitation  No       Education: Education Goals: Education classes will be provided on a weekly basis, covering required topics. Participant will state understanding/return demonstration of topics presented.  Learning Barriers/Preferences: Learning Barriers/Preferences - 04/04/19 1139      Learning  Barriers/Preferences   Learning Barriers  Hearing;Sight    Learning Preferences  Written Material       Education Topics: Count Your Pulse:  -Group instruction provided by verbal instruction, demonstration, patient participation and written materials to support subject.  Instructors address importance of being able to find your pulse and how to count your pulse when at home without a heart monitor.  Patients get hands on experience counting their pulse with staff help and individually.   Heart Attack, Angina, and Risk Factor Modification:  -Group instruction provided by verbal instruction, video, and written materials to support subject.  Instructors address signs and symptoms of angina and heart attacks.    Also discuss risk factors for heart disease and how to make changes to improve heart health risk factors.   Functional Fitness:  -Group instruction provided by verbal instruction, demonstration, patient participation, and written materials to support subject.  Instructors address safety measures for doing things around the house.  Discuss how to get up and down off the floor, how to pick things up  properly, how to safely get out of a chair without assistance, and balance training.   Meditation and Mindfulness:  -Group instruction provided by verbal instruction, patient participation, and written materials to support subject.  Instructor addresses importance of mindfulness and meditation practice to help reduce stress and improve awareness.  Instructor also leads participants through a meditation exercise.    Stretching for Flexibility and Mobility:  -Group instruction provided by verbal instruction, patient participation, and written materials to support subject.  Instructors lead participants through series of stretches that are designed to increase flexibility thus improving mobility.  These stretches are additional exercise for major muscle groups that are typically performed during regular warm up and cool down.   Hands Only CPR:  -Group verbal, video, and participation provides a basic overview of AHA guidelines for community CPR. Role-play of emergencies allow participants the opportunity to practice calling for help and chest compression technique with discussion of AED use.   Hypertension: -Group verbal and written instruction that provides a basic overview of hypertension including the most recent diagnostic guidelines, risk factor reduction with self-care instructions and medication management.    Nutrition I class: Heart Healthy Eating:  -Group instruction provided by PowerPoint slides, verbal discussion, and written materials to support subject matter. The instructor gives an explanation and review of the Therapeutic Lifestyle Changes diet recommendations, which includes a discussion on lipid goals, dietary fat, sodium, fiber, plant stanol/sterol esters, sugar, and the components of a well-balanced, healthy diet.   Nutrition II class: Lifestyle Skills:  -Group instruction provided by PowerPoint slides, verbal discussion, and written materials to support subject matter. The  instructor gives an explanation and review of label reading, grocery shopping for heart health, heart healthy recipe modifications, and ways to make healthier choices when eating out.   Diabetes Question & Answer:  -Group instruction provided by PowerPoint slides, verbal discussion, and written materials to support subject matter. The instructor gives an explanation and review of diabetes co-morbidities, pre- and post-prandial blood glucose goals, pre-exercise blood glucose goals, signs, symptoms, and treatment of hypoglycemia and hyperglycemia, and foot care basics.   Diabetes Blitz:  -Group instruction provided by PowerPoint slides, verbal discussion, and written materials to support subject matter. The instructor gives an explanation and review of the physiology behind type 1 and type 2 diabetes, diabetes medications and rational behind using different medications, pre- and post-prandial blood glucose recommendations and Hemoglobin A1c goals, diabetes diet,  and exercise including blood glucose guidelines for exercising safely.    Portion Distortion:  -Group instruction provided by PowerPoint slides, verbal discussion, written materials, and food models to support subject matter. The instructor gives an explanation of serving size versus portion size, changes in portions sizes over the last 20 years, and what consists of a serving from each food group.   Stress Management:  -Group instruction provided by verbal instruction, video, and written materials to support subject matter.  Instructors review role of stress in heart disease and how to cope with stress positively.     Exercising on Your Own:  -Group instruction provided by verbal instruction, power point, and written materials to support subject.  Instructors discuss benefits of exercise, components of exercise, frequency and intensity of exercise, and end points for exercise.  Also discuss use of nitroglycerin and activating EMS.  Review  options of places to exercise outside of rehab.  Review guidelines for sex with heart disease.   Cardiac Drugs I:  -Group instruction provided by verbal instruction and written materials to support subject.  Instructor reviews cardiac drug classes: antiplatelets, anticoagulants, beta blockers, and statins.  Instructor discusses reasons, side effects, and lifestyle considerations for each drug class.   Cardiac Drugs II:  -Group instruction provided by verbal instruction and written materials to support subject.  Instructor reviews cardiac drug classes: angiotensin converting enzyme inhibitors (ACE-I), angiotensin II receptor blockers (ARBs), nitrates, and calcium channel blockers.  Instructor discusses reasons, side effects, and lifestyle considerations for each drug class.   Anatomy and Physiology of the Circulatory System:  Group verbal and written instruction and models provide basic cardiac anatomy and physiology, with the coronary electrical and arterial systems. Review of: AMI, Angina, Valve disease, Heart Failure, Peripheral Artery Disease, Cardiac Arrhythmia, Pacemakers, and the ICD.   Other Education:  -Group or individual verbal, written, or video instructions that support the educational goals of the cardiac rehab program.   Holiday Eating Survival Tips:  -Group instruction provided by PowerPoint slides, verbal discussion, and written materials to support subject matter. The instructor gives patients tips, tricks, and techniques to help them not only survive but enjoy the holidays despite the onslaught of food that accompanies the holidays.   Knowledge Questionnaire Score: Knowledge Questionnaire Score - 04/04/19 0916      Knowledge Questionnaire Score   Pre Score  21/24       Core Components/Risk Factors/Patient Goals at Admission: Personal Goals and Risk Factors at Admission - 04/04/19 1140      Core Components/Risk Factors/Patient Goals on Admission    Weight  Management  Yes;Weight Maintenance;Weight Loss    Admit Weight  188 lb 15 oz (85.7 kg)    Hypertension  Yes    Intervention  Provide education on lifestyle modifcations including regular physical activity/exercise, weight management, moderate sodium restriction and increased consumption of fresh fruit, vegetables, and low fat dairy, alcohol moderation, and smoking cessation.;Monitor prescription use compliance.    Expected Outcomes  Short Term: Continued assessment and intervention until BP is < 140/81mm HG in hypertensive participants. < 130/64mm HG in hypertensive participants with diabetes, heart failure or chronic kidney disease.;Long Term: Maintenance of blood pressure at goal levels.    Lipids  Yes    Intervention  Provide education and support for participant on nutrition & aerobic/resistive exercise along with prescribed medications to achieve LDL 70mg , HDL >40mg .    Expected Outcomes  Short Term: Participant states understanding of desired cholesterol values and is compliant with medications  prescribed. Participant is following exercise prescription and nutrition guidelines.;Long Term: Cholesterol controlled with medications as prescribed, with individualized exercise RX and with personalized nutrition plan. Value goals: LDL < 70mg , HDL > 40 mg.       Core Components/Risk Factors/Patient Goals Review:    Core Components/Risk Factors/Patient Goals at Discharge (Final Review):    ITP Comments: ITP Comments    Row Name 04/04/19 0820           ITP Comments  Dr. Fransico Him, Medical Director          Comments: Patient attended orientation on 04/04/2019 to review rules and guidelines for program.  Completed 6 minute walk test, Intitial ITP, and exercise prescription.  VSS. Telemetry-SR.  Asymptomatic. Safety measures and social distancing in place per CDC guidelines.

## 2019-04-08 ENCOUNTER — Other Ambulatory Visit: Payer: Self-pay

## 2019-04-08 ENCOUNTER — Encounter (HOSPITAL_COMMUNITY)
Admission: RE | Admit: 2019-04-08 | Discharge: 2019-04-08 | Disposition: A | Discharge status: Home / Self Care | Payer: Medicare Other | Source: Ambulatory Visit | Attending: Cardiology | Admitting: Cardiology

## 2019-04-08 DIAGNOSIS — Z955 Presence of coronary angioplasty implant and graft: Secondary | ICD-10-CM | POA: Diagnosis not present

## 2019-04-08 DIAGNOSIS — Z9861 Coronary angioplasty status: Secondary | ICD-10-CM | POA: Diagnosis not present

## 2019-04-08 NOTE — Progress Notes (Signed)
Daily Session Note  Patient Details  Name: Luis Guzman MRN: [id number] Date of Birth: 06-15-1946 Referring Provider:     [facility] from 04/04/2019 in [place]  Referring Provider  Dr. Ellyn Hack       Encounter Date: 04/08/2019  Check In: Session Check In - 04/08/19 1107      Check-In   Supervising physician immediately available to respond to emergencies  [facility] immediately available    Physician(s)  Dr. Benny Lennert    Location  [facility]    Staff Present  Jiles Garter, RN, Deland Pretty, MS, ACSM CEP, Exercise Physiologist;Brittany Durene Fruits, BS, ACSM CEP, Exercise Physiologist;Avraj Lindroth Venetia Maxon, RN, Bjorn Loser, MS, Exercise Physiologist    Virtual Visit  No    Medication changes reported      No    Fall or balance concerns reported     No    Tobacco Cessation  No Change    Warm-up and Cool-down  Performed on first and last piece of equipment    Resistance Training Performed  Yes    VAD Patient?  No    PAD/SET Patient?  No      Pain Assessment   Currently in Pain?  Yes    Pain Score  6     Pain Location  Hip    Pain Orientation  --   Bilateral   Pain Type  Chronic pain    Pain Onset  More than a month ago    Pain Frequency  Constant    Multiple Pain Sites  No       Capillary Blood Glucose: No results found for this or any previous visit (from the past 24 hour(s)).    Social History   Tobacco Use  Smoking Status Former Smoker  . Packs/day: 1.00  . Years: 5.00  . Pack years: 5.00  . Types: Cigarettes  . Quit date: 10/17/1968  . Years since quitting: 50.5  Smokeless Tobacco Never Used    Goals Met:  No report of cardiac concerns or symptoms  Goals Unmet:  Not Applicable  Comments: Patient reported that he took a sublingual nitroglycerin yesterday at 11:20 AM. Sonia Side rate the pain a 3-4/10 with radiation to his neck. Drayke took a sublingual nitroglycerin with relief.  Manraj denies haivng any chest pain today. Blood pressure 104/76 heart rate 82 Sinus Rhythm. [place] PAC notified. Vin said that Mr Archila may proceed with exercise today if he develops any chest pain today while exercising to call the office to schedule an appointment for follow up. Duglas proceeded to exercise. Complained of chronic bilateral hip pain rated a 6/10 after 3 minutes on the treadmill. Patient switched to the nustep where he remained for 31 minutes. Telemetry rhythm Sinus with PVC's. Patient tolerated exercise without chest pain.Will continue to monitor the patient throughout  the program.Sidhant Helderman Venetia Maxon, RN,BSN 04/08/2019 12:19 PM   Dr Fransico Him,  MD Medical Director

## 2019-04-10 ENCOUNTER — Telehealth: Payer: Self-pay | Admitting: Orthopaedic Surgery

## 2019-04-10 ENCOUNTER — Other Ambulatory Visit: Payer: Self-pay | Admitting: Orthopaedic Surgery

## 2019-04-10 ENCOUNTER — Encounter (HOSPITAL_COMMUNITY)
Admission: RE | Admit: 2019-04-10 | Discharge: 2019-04-10 | Disposition: A | Discharge status: Home / Self Care | Payer: Medicare Other | Source: Ambulatory Visit | Attending: Cardiology | Admitting: Cardiology

## 2019-04-10 ENCOUNTER — Other Ambulatory Visit: Payer: Self-pay

## 2019-04-10 DIAGNOSIS — Z9861 Coronary angioplasty status: Secondary | ICD-10-CM | POA: Diagnosis not present

## 2019-04-10 DIAGNOSIS — Z955 Presence of coronary angioplasty implant and graft: Secondary | ICD-10-CM | POA: Diagnosis not present

## 2019-04-10 MED ORDER — TRAMADOL HCL 50 MG PO TABS: ORAL_TABLET | ORAL | 0 refills | Status: DC

## 2019-04-10 NOTE — Telephone Encounter (Signed)
Could try tramadol 50mg  #30 1-2 po bid prn

## 2019-04-10 NOTE — Telephone Encounter (Signed)
Please advise 

## 2019-04-10 NOTE — Telephone Encounter (Signed)
Tried to call patient. No answer. Left message that we can send in Tramadol to try, but I need to know which local pharmacy he wants that sent to. Ask him to call us back.

## 2019-04-10 NOTE — Telephone Encounter (Signed)
Patient was unable to stop Plavix for 5 days to get injection in hip. Doctor to reevaluate in October at the soonest. Patient having a hard time getting around, doing cardio exercises due to pain with hip, limited walking with hip pain. Voltaren Gel does not really help. Patient requesting any suggestions from doctor to help with pain. Please call to advise.

## 2019-04-10 NOTE — Telephone Encounter (Signed)
Patient called back to give you pharmacy info to call in Tramadol for him. [facility] [address].

## 2019-04-10 NOTE — Telephone Encounter (Signed)
Called and spoke with patient. He is aware that prescription has been sent to pharmacy on file.

## 2019-04-11 ENCOUNTER — Telehealth: Payer: Self-pay | Admitting: Orthopaedic Surgery

## 2019-04-11 NOTE — Telephone Encounter (Signed)
[facility] stating they received a voicemail for prescription of Tramadol for the patient.  They are requesting a return call to confirm the quantity.  Please call [phone]

## 2019-04-11 NOTE — Telephone Encounter (Signed)
Called pharmacy to clarify amount.

## 2019-04-12 ENCOUNTER — Encounter (HOSPITAL_COMMUNITY): Payer: Medicare Other

## 2019-04-15 ENCOUNTER — Encounter (HOSPITAL_COMMUNITY): Payer: Medicare Other

## 2019-04-15 ENCOUNTER — Telehealth (HOSPITAL_COMMUNITY): Payer: Self-pay | Admitting: *Deleted

## 2019-04-15 NOTE — Progress Notes (Signed)
Discharge Progress Report  Patient Details  Name: Luis Guzman MRN: [id number] Date of Birth: 10-23-1945 Referring Provider:     [place] from 04/04/2019 in [place]  Referring Provider  Dr. Ellyn Hack        Number of Visits: 3  Reason for Discharge:  Early Exit:  Pt discharged from CR d/t continued hip pain.  Pt would like to return once this is under control.  Smoking History:  Social History   Tobacco Use  Smoking Status Former Smoker  . Packs/day: 1.00  . Years: 5.00  . Pack years: 5.00  . Types: Cigarettes  . Quit date: 10/17/1968  . Years since quitting: 50.5  Smokeless Tobacco Never Used    Diagnosis:  Status post coronary artery stent placement  S/P PTCA (percutaneous transluminal coronary angioplasty)  ADL UCSD:   Initial Exercise Prescription: Initial Exercise Prescription - 04/04/19 1100      Date of Initial Exercise RX and Referring Provider   Date  04/04/19    Referring Provider  Dr. Ellyn Hack     Expected Discharge Date  05/10/19      Treadmill   MPH  2.4    Grade  0    Minutes  15      NuStep   Level  3    SPM  85    Minutes  15    METs  2.9      Prescription Details   Frequency (times per week)  3    Duration  Progress to 30 minutes of continuous aerobic without signs/symptoms of physical distress      Intensity   THRR 40-80% of Max Heartrate  59-118    Ratings of Perceived Exertion  11-13      Progression   Progression  Continue to progress workloads to maintain intensity without signs/symptoms of physical distress.      Resistance Training   Training Prescription  Yes    Weight  4 lbs.     Reps  10-15       Discharge Exercise Prescription (Final Exercise Prescription Changes): Exercise Prescription Changes - 04/08/19 1054      Response to Exercise   Blood Pressure (Admit)  104/76    Blood Pressure (Exercise)  154/82    Blood Pressure (Exit)  108/76    Heart Rate  (Admit)  94 bpm    Heart Rate (Exercise)  113 bpm    Heart Rate (Exit)  102 bpm    Rating of Perceived Exertion (Exercise)  14    Perceived Dyspnea (Exercise)  0    Symptoms  none    Comments  Pt had some hip pain with exercise. This is chronic painadjust exercise accordingly.    Duration  Progress to 30 minutes of  aerobic without signs/symptoms of physical distress    Intensity  THRR unchanged      Progression   Progression  Continue to progress workloads to maintain intensity without signs/symptoms of physical distress.    Average METs  2.5      Resistance Training   Training Prescription  Yes    Weight  4 lbs.     Reps  10-15    Time  10 Minutes      Interval Training   Interval Training  No      Treadmill   MPH  2.4    Grade  0    Minutes  3   Patient unable  to tolerate the TM due to bilateral hip pain.   METs  2.53      NuStep   Level  3    SPM  85    Minutes  31    METs  2.4       Functional Capacity: 6 Minute Walk    Row Name 04/04/19 1131         6 Minute Walk   Phase  Initial     Distance  1634 feet     Walk Time  6 minutes     # of Rest Breaks  0     MPH  3     METS  3.5     RPE  13     Perceived Dyspnea   0     VO2 Peak  12.25     Symptoms  Yes (comment)     Comments  4, Hip Pain     Resting HR  82 bpm     Resting BP  124/68     Resting Oxygen Saturation   97 %     Exercise Oxygen Saturation  during 6 min walk  97 %     Max Ex. HR  100 bpm     Max Ex. BP  128/64     2 Minute Post BP  120/68        Psychological, QOL, Others - Outcomes: PHQ 2/9: Depression screen [facility] 2/9 04/04/2019 05/02/2018 05/02/2018 04/18/2017 04/15/2016  Decreased Interest 0 0 0 0 0  Down, Depressed, Hopeless 0 0 0 0 0  PHQ - 2 Score 0 0 0 0 0    Quality of Life: Quality of Life - 04/04/19 0915      Quality of Life   Select  Quality of Life      Quality of Life Scores   Health/Function Pre  26.8 %    Socioeconomic Pre  29.14 %    Psych/Spiritual Pre  30 %     Family Pre  27.6 %    GLOBAL Pre  28.06 %       Personal Goals: Goals established at orientation with interventions provided to work toward goal. Personal Goals and Risk Factors at Admission - 04/04/19 1140      Core Components/Risk Factors/Patient Goals on Admission    Weight Management  Yes;Weight Maintenance;Weight Loss    Admit Weight  188 lb 15 oz (85.7 kg)    Hypertension  Yes    Intervention  Provide education on lifestyle modifcations including regular physical activity/exercise, weight management, moderate sodium restriction and increased consumption of fresh fruit, vegetables, and low fat dairy, alcohol moderation, and smoking cessation.;Monitor prescription use compliance.    Expected Outcomes  Short Term: Continued assessment and intervention until BP is < 140/108mm HG in hypertensive participants. < 130/51mm HG in hypertensive participants with diabetes, heart failure or chronic kidney disease.;Long Term: Maintenance of blood pressure at goal levels.    Lipids  Yes    Intervention  Provide education and support for participant on nutrition & aerobic/resistive exercise along with prescribed medications to achieve LDL 70mg , HDL >40mg .    Expected Outcomes  Short Term: Participant states understanding of desired cholesterol values and is compliant with medications prescribed. Participant is following exercise prescription and nutrition guidelines.;Long Term: Cholesterol controlled with medications as prescribed, with individualized exercise RX and with personalized nutrition plan. Value goals: LDL < 70mg , HDL > 40 mg.        Personal Goals  Discharge: Goals and Risk Factor Review    Row Name 04/08/19 1416 04/15/19 1658           Core Components/Risk Factors/Patient Goals Review   Personal Goals Review  Hypertension;Lipids;Weight Management/Obesity  Hypertension;Lipids;Weight Management/Obesity      Review  Pt with multiple CAD RFs willing to participate in CR exercise.  Luis Guzman  would like to decrease his cholesterol and start a walking routine when his hip does not bother him.  Pt discharged from CR d/t continued hip pain.  Pt would like to return once this is under control.      Expected Outcomes  Luis Guzman will participate in CR exercise, nutrition, and lifestyle modification opportunities.  Ezkiel will participate in CR exercise, nutrition, and lifestyle modification opportunities.         Exercise Goals and Review: Exercise Goals    Row Name 04/04/19 1138             Exercise Goals   Increase Physical Activity  Yes       Intervention  Provide advice, education, support and counseling about physical activity/exercise needs.;Develop an individualized exercise prescription for aerobic and resistive training based on initial evaluation findings, risk stratification, comorbidities and participant's personal goals.       Expected Outcomes  Short Term: Attend rehab on a regular basis to increase amount of physical activity.;Long Term: Add in home exercise to make exercise part of routine and to increase amount of physical activity.;Long Term: Exercising regularly at least 3-5 days a week.       Increase Strength and Stamina  Yes       Intervention  Provide advice, education, support and counseling about physical activity/exercise needs.;Develop an individualized exercise prescription for aerobic and resistive training based on initial evaluation findings, risk stratification, comorbidities and participant's personal goals.       Expected Outcomes  Short Term: Increase workloads from initial exercise prescription for resistance, speed, and METs.;Short Term: Perform resistance training exercises routinely during rehab and add in resistance training at home;Long Term: Improve cardiorespiratory fitness, muscular endurance and strength as measured by increased METs and functional capacity (6MWT)       Able to understand and use rate of perceived exertion (RPE) scale  Yes        Intervention  Provide education and explanation on how to use RPE scale       Expected Outcomes  Short Term: Able to use RPE daily in rehab to express subjective intensity level;Long Term:  Able to use RPE to guide intensity level when exercising independently       Knowledge and understanding of Target Heart Rate Range (THRR)  Yes       Intervention  Provide education and explanation of THRR including how the numbers were predicted and where they are located for reference       Expected Outcomes  Short Term: Able to state/look up THRR;Long Term: Able to use THRR to govern intensity when exercising independently;Short Term: Able to use daily as guideline for intensity in rehab       Able to check pulse independently  Yes       Intervention  Provide education and demonstration on how to check pulse in carotid and radial arteries.;Review the importance of being able to check your own pulse for safety during independent exercise       Expected Outcomes  Short Term: Able to explain why pulse checking is important during independent exercise;Long Term: Able to check pulse  independently and accurately       Understanding of Exercise Prescription  Yes       Intervention  Provide education, explanation, and written materials on patient's individual exercise prescription       Expected Outcomes  Short Term: Able to explain program exercise prescription;Long Term: Able to explain home exercise prescription to exercise independently          Exercise Goals Re-Evaluation: Exercise Goals Re-Evaluation    Row Name 04/08/19 1054             Exercise Goal Re-Evaluation   Exercise Goals Review  Increase Physical Activity;Able to understand and use rate of perceived exertion (RPE) scale       Comments  Patient able to understand and use RPE scale appropriately. Pt unable to tolerate TM walking due to bilateral hip pain, which is chronic. Moved from TM to reumbent stepper for the remainder of exercise session.        Expected Outcomes  Increase workloads as tolerated to help increase strength and stamina.          Nutrition & Weight - Outcomes: Pre Biometrics - 04/04/19 0800      Pre Biometrics   Waist Circumference  38 inches    Hip Circumference  39.5 inches    Waist to Hip Ratio  0.96 %    Triceps Skinfold  26 mm    % Body Fat  27.9 %    Grip Strength  40 kg    Flexibility  12 in    Single Leg Stand  30 seconds        Nutrition:   Nutrition Discharge:   Education Questionnaire Score: Knowledge Questionnaire Score - 04/04/19 0916      Knowledge Questionnaire Score   Pre Score  21/24       Goals reviewed with patient; copy given to patient.

## 2019-04-15 NOTE — Telephone Encounter (Signed)
Messaged received from pt requesting call back on department VM.  PC to patient.  Pt reports continued hip pain despite trying newly ordered pain medication.  Pt is experiencing pain with walking.  Pt would like to stop CR for now and restart CR after he receives an injection in his hip.  Pt states that this injection typically works for his pain.  Confirmed with pt that he would be able to restart CR once this has been completed.  Pt informed that he will need to call CR once he has done this and his hip pain is better.  Pt verbalized understanding.  Will D/C pt from CR.

## 2019-04-17 ENCOUNTER — Encounter (HOSPITAL_COMMUNITY): Payer: Medicare Other

## 2019-04-19 ENCOUNTER — Encounter (HOSPITAL_COMMUNITY): Payer: Medicare Other

## 2019-04-24 ENCOUNTER — Encounter (HOSPITAL_COMMUNITY): Payer: Medicare Other

## 2019-04-26 ENCOUNTER — Encounter (HOSPITAL_COMMUNITY): Payer: Medicare Other

## 2019-04-29 ENCOUNTER — Encounter (HOSPITAL_COMMUNITY): Payer: Medicare Other

## 2019-05-01 ENCOUNTER — Encounter (HOSPITAL_COMMUNITY): Payer: Medicare Other

## 2019-05-02 ENCOUNTER — Telehealth: Payer: Self-pay | Admitting: Pharmacist Clinician (PhC)/ Clinical Pharmacy Specialist

## 2019-05-02 NOTE — Telephone Encounter (Signed)
Patient called today - states that after second dose of Repatha (3.5 wks ago) he had an episode of vomiting. Took his next dose 2 wks later and felt fine for first 7 days.  Since then has had severe diarrhea, nausea and vomiting, has not been able to eat for the past 4 days.    Explained that I would not suspect this to be a reaction to Repatha, as it was delayed x 7 days.  His next dose is due on Monday and he isn't sure what he should do.  He is willing to give the medication another chance.  I suggested that he wait another week (3 wks after last dose) to be sure he is feeling normal, before taking a dose.  He will also review with his PCP next Monday at his annual exam appt, so see if any GI bugs going around.

## 2019-05-03 ENCOUNTER — Encounter (HOSPITAL_COMMUNITY): Payer: Medicare Other

## 2019-05-06 ENCOUNTER — Ambulatory Visit (INDEPENDENT_AMBULATORY_CARE_PROVIDER_SITE_OTHER): Payer: Medicare Other | Admitting: Family Medicine

## 2019-05-06 ENCOUNTER — Encounter (HOSPITAL_COMMUNITY): Payer: Medicare Other

## 2019-05-06 ENCOUNTER — Other Ambulatory Visit: Payer: Self-pay

## 2019-05-06 ENCOUNTER — Encounter: Payer: Self-pay | Admitting: Family Medicine

## 2019-05-06 VITALS — BP 122/80 | HR 62 | Temp 97.4°F | Ht 70.5 in | Wt 185.2 lb

## 2019-05-06 DIAGNOSIS — R739 Hyperglycemia, unspecified: Secondary | ICD-10-CM | POA: Diagnosis not present

## 2019-05-06 DIAGNOSIS — I209 Angina pectoris, unspecified: Secondary | ICD-10-CM | POA: Diagnosis not present

## 2019-05-06 DIAGNOSIS — R5383 Other fatigue: Secondary | ICD-10-CM

## 2019-05-06 DIAGNOSIS — Z23 Encounter for immunization: Secondary | ICD-10-CM

## 2019-05-06 DIAGNOSIS — Z0000 Encounter for general adult medical examination without abnormal findings: Secondary | ICD-10-CM

## 2019-05-06 DIAGNOSIS — I2510 Atherosclerotic heart disease of native coronary artery without angina pectoris: Secondary | ICD-10-CM

## 2019-05-06 DIAGNOSIS — Z9861 Coronary angioplasty status: Secondary | ICD-10-CM | POA: Diagnosis not present

## 2019-05-06 DIAGNOSIS — Z125 Encounter for screening for malignant neoplasm of prostate: Secondary | ICD-10-CM

## 2019-05-06 DIAGNOSIS — E876 Hypokalemia: Secondary | ICD-10-CM | POA: Diagnosis not present

## 2019-05-06 DIAGNOSIS — E785 Hyperlipidemia, unspecified: Secondary | ICD-10-CM | POA: Diagnosis not present

## 2019-05-06 LAB — LIPID PANEL
Cholesterol: 110 mg/dL (ref 0–200)
HDL: 37.4 mg/dL — ABNORMAL LOW (ref >39.00)
LDL Cholesterol: 53 mg/dL (ref 0–99)
NonHDL: 72.88
Total CHOL/HDL Ratio: 3
Triglycerides: 101 mg/dL (ref 0.0–149.0)
VLDL: 20.2 mg/dL (ref 0.0–40.0)

## 2019-05-06 LAB — HEPATIC FUNCTION PANEL
ALT: 16 U/L (ref 0–53)
AST: 20 U/L (ref 0–37)
Albumin: 4.1 g/dL (ref 3.5–5.2)
Alkaline Phosphatase: 100 U/L (ref 39–117)
Bilirubin, Direct: 0.1 mg/dL (ref 0.0–0.3)
Total Bilirubin: 0.6 mg/dL (ref 0.2–1.2)
Total Protein: 6.3 g/dL (ref 6.0–8.3)

## 2019-05-06 LAB — TSH: TSH: 0.61 u[IU]/mL (ref 0.35–4.50)

## 2019-05-06 LAB — BASIC METABOLIC PANEL
BUN: 9 mg/dL (ref 6–23)
CO2: 29 mEq/L (ref 19–32)
Calcium: 9.7 mg/dL (ref 8.4–10.5)
Chloride: 105 mEq/L (ref 96–112)
Creatinine, Ser: 0.85 mg/dL (ref 0.40–1.50)
GFR: 88.33 mL/min (ref >60.00)
Glucose, Bld: 102 mg/dL — ABNORMAL HIGH (ref 70–99)
Potassium: 4.6 mEq/L (ref 3.5–5.1)
Sodium: 142 mEq/L (ref 135–145)

## 2019-05-06 LAB — PSA, MEDICARE: PSA: 4.48 ng/ml — ABNORMAL HIGH (ref 0.10–4.00)

## 2019-05-06 LAB — HEMOGLOBIN A1C: Hgb A1c MFr Bld: 6.1 % (ref 4.6–6.5)

## 2019-05-06 NOTE — Progress Notes (Signed)
Subjective:     Patient ID: Luis Guzman, male   DOB: 22-Dec-1945, 73 y.o.   MRN: [id number]  HPI Patient is here for Medicare wellness visit and medical follow-up.  He was seen here back in June with concerning chest pain and was referred to cardiology and had prompt evaluation for angina.  He had multivessel disease.  He had 65% mid circumflex lesion.  First diagonal lesion 85% stenosed and angioplasty performed.  He had a 90% RPDA lesion and drug-eluting eluting stent was placed.  He had complication of radial artery bleed after procedure and that stabilized with pressure.  He has done reasonably well since then.  He is had only one episode of fleeting chest pain which was relieved by nitroglycerin but none since then.  He has been unable to participate in cardiac rehab because of ongoing left hip pain.  He had a few injections previous to his cardiac procedures which helped temporarily.  He  tried tramadol without much improvement.  His blood pressures been stable.  He is very sedentary.  He had intolerance of multiple statins.  He is currently on Repatha and this was started in early August.  He has tolerated fairly well.  He has had some nonspecific fatigue symptoms.  Appetite and weight fairly stable.  He did have recent episode of transient nausea and vomiting and diarrhea.  Those symptoms have resolved at this time.  He has had previous cholecystectomy  Past Medical History:  Diagnosis Date  . CAD S/P PTCA & DES PCI - for Progressive Angina 02/26/2019   Cath-PCI 02/26/2019: EF 55-65%. mCx 65% (FFR 0.82 - Med Rx). D2 85% - Wolverine Scoring Balloon PTCA (2.0 mm). Ost rPDA - 90% -Resolute Onyx 2.5 x 15 (2.6 mm).   . GERD (gastroesophageal reflux disease)   . History of hiatal hernia    40 yrs ago  . HYPERLIPIDEMIA 01/26/2009  . HYPERTENSION 01/26/2009  . 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 lower back pain, s/p cervical neck surgery"stiffness" remains  . Transfusion  history    infant "anemia"   Past Surgical History:  Procedure Laterality Date  . APPENDECTOMY  ~ 1959  . BACK SURGERY    . CERVICAL DISC ARTHROPLASTY N/A 07/30/2018   Procedure: Cervical five-six Cervical six-seven Artificial disc replacement;  Surgeon: Kristeen Miss, MD;  Location: [place];  Service: Neurosurgery;  Laterality: N/A;  . CERVICAL LAMINECTOMY  1997   C 4 and C 5  . CHOLECYSTECTOMY N/A 04/15/2014   Procedure: LAPAROSCOPIC CHOLECYSTECTOMY WITH INTRAOPERATIVE CHOLANGIOGRAM;  Surgeon: Armandina Gemma, MD;  Location: [facility];  Service: General;  Laterality: N/A;  . COLONOSCOPY    . CORONARY BALLOON ANGIOPLASTY N/A 02/26/2019   Procedure: CORONARY BALLOON ANGIOPLASTY;  Surgeon: Leonie Man, MD;  Location: [place] [facility];  Service: Cardiovascular -> scoring balloon PTCA (Wolverine 2.0 mm) of ostial D2 85% reducing to 20-30%.  . CORONARY STENT INTERVENTION N/A 02/26/2019   Procedure: CORONARY STENT INTERVENTION;  Surgeon: Leonie Man, MD;  Location: [place] [facility];; PCI ostial RPDA (initial attempt of PTCA only led to small local tear/dissection covered with stent) Resolute Onyx 2.5 mm x 15 mm (2.6 mm  . ESOPHAGOGASTRODUODENOSCOPY N/A 03/13/2014   Procedure: ESOPHAGOGASTRODUODENOSCOPY (EGD);  Surgeon: Wonda Horner, MD;  Location: [facility];  Service: Endoscopy;  Laterality: N/A;  . INTRAVASCULAR PRESSURE WIRE/FFR STUDY N/A 02/26/2019   Procedure: INTRAVASCULAR PRESSURE WIRE/FFR STUDY;  Surgeon: Leonie Man, MD;  Location: [place]  [facility];  Service: Cardiovascular;; mCx ~65% - DFR 0.92, FR 0.82 - BORDERLINE--> MED Rx  . LEFT HEART CATH AND CORONARY ANGIOGRAPHY N/A 02/26/2019   Procedure: LEFT HEART CATH AND CORONARY ANGIOGRAPHY;  Surgeon: Leonie Man, MD;  Location: [place] [facility];  EF 55-65%. mCx 65% (FFR 0.82 - Med Rx). D2 85% - Wolverine Scoring Balloon PTCA (2.0 mm). Ost rPDA - 90% -Resolute Onyx 2.5 x 15 (2.6 mm).   . ORIF FIBULA FRACTURE Left 09/2008    compartment syndrome  . SHOULDER ARTHROSCOPY Bilateral   . SHOULDER ARTHROSCOPY W/ ROTATOR CUFF REPAIR Bilateral 2004-2009   left 2004, right 2009  . VASECTOMY    . WISDOM TOOTH EXTRACTION      reports that he quit smoking about 50 years ago. His smoking use included cigarettes. He has a 5.00 pack-year smoking history. He has never used smokeless tobacco. He reports previous alcohol use. He reports that he does not use drugs. family history includes Arthritis in his sister; Hypertension in his father and mother; Stroke (age of onset: 50) in his sister. Allergies  Allergen Reactions  . Penicillins Itching    Has patient had a PCN reaction causing immediate rash, facial/tongue/throat swelling, SOB or lightheadedness with hypotension: no Has patient had a PCN reaction causing severe rash involving mucus membranes or skin necrosis: unkn Has patient had a PCN reaction that required hospitalization: no Has patient had a PCN reaction occurring within the last 10 years: no If all of the above answers are "NO", then may proceed with Cephalosporin use.    1.  Risk factors based on Past Medical , Social, and Family history reviewed and as indicated above with no changes  2.  Limitations in physical activities None.  No recent falls.  Limited in exercise because of osteoarthritis of the left hip  3.  Depression/mood No active depression or anxiety issues PHQ 2 equals 0  4.  Hearing -he has chronic bilateral hearing loss and has hearing aids but does not wear these consistently  5.  ADLs independent in all.  6.  Cognitive function (orientation to time and place, language, writing, speech,memory) no short or long term memory issues.  Language and judgement intact.  7.  Home Safety no issues  8.  Height, weight, and visual acuity.all stable.  He gets ophthalmology exams yearly  9.  Counseling discussed -Counseled regarding age and gender appropriate preventative screenings and  immunizations.  10. Recommendation of preventive services.  Flu vaccine given.  Patient had recent Shingrix through pharmacy  11. Labs based on risk factors-lipids, hepatic, TSH, basic metabolic panel, PSA  12. Care Plan-as below  13. Other Providers Dr. Ellyn Hack, cardiology  14. Written schedule of screening/prevention services given to patient. Health Maintenance  Topic Date Due  . HEMOGLOBIN A1C  10/31/2018  . INFLUENZA VACCINE  03/16/2019  . OPHTHALMOLOGY EXAM  03/30/2019  . FOOT EXAM  05/03/2019  . TETANUS/TDAP  05/05/2024  . COLONOSCOPY  02/14/2026  . Hepatitis C Screening  Completed  . PNA vac Low Risk Adult  Completed      Review of Systems  Constitutional: Positive for fatigue. Negative for appetite change, chills, fever and unexpected weight change.  Eyes: Negative for visual disturbance.  Respiratory: Negative for cough, chest tightness and shortness of breath.   Cardiovascular: Negative for chest pain, palpitations and leg swelling.  Gastrointestinal: Negative for abdominal pain.  Endocrine: Negative for polydipsia and polyuria.  Genitourinary: Negative for dysuria.  Musculoskeletal: Positive  for arthralgias.  Neurological: Negative for dizziness, syncope, weakness, light-headedness and headaches.       Objective:   Physical Exam Constitutional:      General: He is not in acute distress.    Appearance: He is well-developed.  HENT:     Head: Normocephalic and atraumatic.     Right Ear: External ear normal.     Left Ear: External ear normal.  Eyes:     Conjunctiva/sclera: Conjunctivae normal.     Pupils: Pupils are equal, round, and reactive to light.  Neck:     Musculoskeletal: Normal range of motion and neck supple.     Thyroid: No thyromegaly.  Cardiovascular:     Rate and Rhythm: Normal rate and regular rhythm.     Heart sounds: Normal heart sounds. No murmur.  Pulmonary:     Effort: No respiratory distress.     Breath sounds: No wheezing or  rales.  Abdominal:     General: Bowel sounds are normal. There is no distension.     Palpations: Abdomen is soft. There is no mass.     Tenderness: There is no abdominal tenderness. There is no guarding or rebound.  Lymphadenopathy:     Cervical: No cervical adenopathy.  Skin:    Findings: No rash.  Neurological:     Mental Status: He is alert and oriented to person, place, and time.     Cranial Nerves: No cranial nerve deficit.     Deep Tendon Reflexes: Reflexes normal.        Assessment:     #1 Medicare subsequent annual wellness visit-health maintenance issues addressed as below  #2 hypertension stable and at goal  #3 hyperlipidemia with goal LDL less than 70 with intolerance to multiple statins.  Recent initiation of Repatha  #4 history of CAD as above currently stable symptomatically  #5 history of mild hyperglycemia    Plan:     -Check further labs with basic metabolic panel, lipid panel, hepatic panel, hemoglobin A1c, PSA -The natural history of prostate cancer and ongoing controversy regarding screening and potential treatment outcomes of prostate cancer has been discussed with the patient. The meaning of a false positive PSA and a false negative PSA has been discussed. He indicates understanding of the limitations of this screening test and wishes  to proceed with screening PSA testing. -Flu vaccine given -Very limited in activity because of his left hip osteoarthritis.  He will be in touch with his cardiologist regarding approval if/when he can get any further procedures done for his left hip  Eulas Post MD [facility]

## 2019-05-06 NOTE — Patient Instructions (Signed)
Continue with annual flu vaccine  We will call you with lab results.

## 2019-05-08 ENCOUNTER — Telehealth: Payer: Self-pay | Admitting: Orthopaedic Surgery

## 2019-05-08 ENCOUNTER — Other Ambulatory Visit: Payer: Self-pay | Admitting: Orthopaedic Surgery

## 2019-05-08 ENCOUNTER — Encounter (HOSPITAL_COMMUNITY): Payer: Medicare Other

## 2019-05-08 ENCOUNTER — Other Ambulatory Visit: Payer: Self-pay | Admitting: Family Medicine

## 2019-05-08 DIAGNOSIS — R972 Elevated prostate specific antigen [PSA]: Secondary | ICD-10-CM

## 2019-05-08 MED ORDER — TRAMADOL HCL 50 MG PO TABS: ORAL_TABLET | ORAL | 0 refills | Status: DC

## 2019-05-08 NOTE — Telephone Encounter (Signed)
Please advise 

## 2019-05-08 NOTE — Telephone Encounter (Signed)
Advised patient that refill has been sent to pharmacy. 

## 2019-05-08 NOTE — Telephone Encounter (Signed)
Ok to renew?  

## 2019-05-08 NOTE — Telephone Encounter (Signed)
Patient called requesting prescription refill of [facility] [address].

## 2019-05-10 ENCOUNTER — Encounter (HOSPITAL_COMMUNITY): Payer: Medicare Other

## 2019-05-13 ENCOUNTER — Encounter (HOSPITAL_COMMUNITY): Payer: Medicare Other

## 2019-05-15 ENCOUNTER — Encounter (HOSPITAL_COMMUNITY): Payer: Medicare Other

## 2019-05-17 ENCOUNTER — Encounter (HOSPITAL_COMMUNITY): Payer: Medicare Other

## 2019-05-17 ENCOUNTER — Telehealth: Payer: Self-pay

## 2019-05-17 NOTE — Telephone Encounter (Signed)
Not needed

## 2019-05-17 NOTE — Telephone Encounter (Signed)
Request for surgical clearance:  1. What type of surgery is being performed? LEFT HIP INJECTION   2. When is this surgery scheduled? TBD   3. What type of clearance is required (medical clearance vs. Pharmacy clearance to hold med vs. Both)? BOTH  4. Are there any medications that need to be held prior to surgery and how long? PLAVIX-5DAYS   5. Practice name and name of physician performing surgery?  [facility]    6. What is your office phone number [phone]    7.   What is your office fax number [phone]  8.   Anesthesia type (None, local, MAC, general) ?  LOCAL  PLEASE NOTE IF: 1-AUTHORIZED FOR THIS INJECTION 2-AUTHORIZED FOR THIS INJECTION AND ADDITIONAL  INJECTIONS WITHIN THE NEXT 6 MONTHS 3-AUTHORIZATION NOT GRANTED

## 2019-05-20 NOTE — Telephone Encounter (Signed)
Dr. Ellyn Hack  We received another request to hold this patient's Plavix for 5 days for left hip injection secondary to significant pain in which it is limiting his physical activity.  Request was previously sent 03/2019 however you decided that it was too soon to stop his Plavix in the setting of recent stenting.  LHC 02/26/2019 with balloon angioplasty to first diagonal and DES to rPDA.  Plan was to reevaluate in October as stent thrombosis was thought to be low enough at that time if asymptomatic  Unfortunately, in discussion with the patient today, he has had what sounds like atypical anginal symptoms.  He says for the last 2-3 mornings he will wake up with chest pain that dissipates after waking and does not return.  He cannot discern if he has exertional qualities as this is limited because of his orthopedic issues.  Has no previous history of GERD and reports it feels more like a pain as opposed to burning.  He has not taken SL NTG.  He was previously taking Imdur however this was stopped soon after his stent placement secondary to headache.  It was recommended that he restart this at 15 mg daily.  Please send your recommendations to the preop pool  Thank you Sharee Pimple

## 2019-05-20 NOTE — Telephone Encounter (Signed)
I did like to see that he is not having any chest discomfort once we start 15 mg of Imdur.  If he is stable with no more chest discomfort, then I think as of mid October (14th and 15th) would be 3 months out from PCI.  There is still a high risk for stent thrombosis, but somewhat reduced with new generation DES stents.  If this is limiting his activity, then holding it for 5 days may be worth it.  There is a higher chance of in-stent thrombosis as a result however.  Difficult to tell just what the risk is because it is usually individual risk.   Glenetta Hew, MD

## 2019-05-21 NOTE — Telephone Encounter (Signed)
Sent fax to requesting provider's office about recommendations.

## 2019-05-21 NOTE — Telephone Encounter (Signed)
    Please let the requesting team know that because the patient was having symptoms when contacted on 05/20/2019 we will need to re-assess his status next week after the addition of Imdur 15mg  QD to determine if he can proceed with the injection versus  an appointment for further evaluation.   Thank you  Kathyrn Drown NP-C [facility] Pager: [phone]

## 2019-05-21 NOTE — Telephone Encounter (Signed)
Received call back from patient this afternoon regarding another episode of chest pain, similar to what he described to me yesterday however a bit more intense. He reports taking a SL NTG with relief after approximately 10 minutes with no return. We discussed needing to come to the office for an appointment to evaluate if further ischemic workup is necessary. Pre-op call back notified as well as Dr. Ellyn Hack included in this message.   Please notify the patient of date and time of appointment    Will follow along.   Kathyrn Drown NP-C [facility] Pager: [phone]

## 2019-05-22 NOTE — Telephone Encounter (Signed)
   Primary Cardiologist:David Ellyn Hack, MD  Chart reviewed as part of pre-operative protocol coverage. Because of Luis Guzman's past medical history and current symptoms, he/she will require a follow-up visit in order to better assess preoperative cardiovascular risk.  Pre-op covering staff: - Please schedule appointment and call patient to inform them. - Please contact requesting surgeon's office via preferred method (i.e, phone, fax) to inform them of need for appointment prior to surgery.  If applicable, this message will also be routed to pharmacy pool and/or primary cardiologist for input on holding anticoagulant/antiplatelet agent as requested below so that this information is available at time of patient's appointment.   Kathyrn Drown, NP  05/22/2019, 9:31 AM

## 2019-05-22 NOTE — Telephone Encounter (Signed)
I tried to reach out to the pt per Kathyrn Drown, PA for get a sooner appt based on questionable anginal symptoms yesterday per Kathyrn Drown, PAC. Left message to call back and let the operator know you are needing to make an appt to be seen this week or next week.

## 2019-05-22 NOTE — Telephone Encounter (Signed)
I guess so.  We need to address this before we can talk about holding his Plavix.  Glenetta Hew, MD

## 2019-05-23 NOTE — Telephone Encounter (Signed)
Pt has appt to see Dr. Ellyn Hack 05/30/19. I will forward surgery clearance information to Dr. Ellyn Hack for appt. I will remove from the pre op call back pool .

## 2019-05-28 ENCOUNTER — Telehealth: Payer: Self-pay | Admitting: Pharmacist

## 2019-05-28 NOTE — Telephone Encounter (Signed)
Patient reports onset of diarrhea about 1 week after getting 2nd dose of Repatha.  Recommendation:  1. Wait 3 week after last Repatha dose , then re-challenge. If diarrhea repeat, please contact us at [phone] to try Praluent 150mg .

## 2019-05-30 ENCOUNTER — Ambulatory Visit (INDEPENDENT_AMBULATORY_CARE_PROVIDER_SITE_OTHER): Payer: Medicare Other | Admitting: Cardiology

## 2019-05-30 ENCOUNTER — Encounter: Payer: Self-pay | Admitting: Cardiology

## 2019-05-30 ENCOUNTER — Other Ambulatory Visit: Payer: Self-pay

## 2019-05-30 ENCOUNTER — Telehealth: Payer: Self-pay | Admitting: *Deleted

## 2019-05-30 VITALS — BP 120/80 | HR 72 | Ht 72.0 in | Wt 184.0 lb

## 2019-05-30 DIAGNOSIS — I10 Essential (primary) hypertension: Secondary | ICD-10-CM

## 2019-05-30 DIAGNOSIS — I209 Angina pectoris, unspecified: Secondary | ICD-10-CM

## 2019-05-30 DIAGNOSIS — Z01810 Encounter for preprocedural cardiovascular examination: Secondary | ICD-10-CM

## 2019-05-30 DIAGNOSIS — Z9861 Coronary angioplasty status: Secondary | ICD-10-CM | POA: Diagnosis not present

## 2019-05-30 DIAGNOSIS — E785 Hyperlipidemia, unspecified: Secondary | ICD-10-CM | POA: Diagnosis not present

## 2019-05-30 DIAGNOSIS — I2510 Atherosclerotic heart disease of native coronary artery without angina pectoris: Secondary | ICD-10-CM

## 2019-05-30 NOTE — Telephone Encounter (Signed)
CALLED  SPOKE TO   [facility].  PATIENT HAD APPOINTMENT TODAY.  PER DR HARDING ,  PATIENT AWARE OF RISK FOR HOLDING MEDICATION.   PER DR HARDING ORDER--PATIENT MAY HOLD ASPIRIN AND PLAVIX STARTING 5 DAYS PRIOR TO HIP INJECTION.   PATIENT IS AWARE THAT IF HE DEVELOP CHEST DISCOMFORT  - WHILE OFF MEDICATION - GOT TO ER .  PATIENT IS AWARE TO CONTACT OFFICE AFTER INJECTION TO MAKE AN APPOINTMENT IN 3 WEEKS  - TO SEE DR HARDING OR LUKE KILROY PA - DISCUSS IF SYMPTOMS HAVE IMPROVED OR FURTHER TESTING IS NEEDED.

## 2019-05-30 NOTE — Progress Notes (Signed)
PCP: Eulas Post, MD  Clinic Note: Chief Complaint  Patient presents with  . Pre-op Exam    Preprocedure evaluation prior to having hip injections  . Chest Pain  . Coronary Artery Disease    PCI and PTCA     HPI:   Luis Guzman is a 73 y.o. male with a history of CAD having PCI for progressive angina below who presents today for evaluation of chest discomfort and preop assessment.   Cath-PCI 02/26/2019: EF 55-65%. mCx 65% (FFR 0.82 - Med Rx). D2 85% - Wolverine Scoring Balloon PTCA (2.0 mm). Ost rPDA - 90% -Resolute Onyx 2.5 x 15 (2.6 mm).   Luis Guzman was last seen on on 7/27,2020 post cardiac cath.  Catheter been complicated by right forearm hematoma.  He noted that he was not have any was angina.  Just was yet reactive routine activities.  Stated he felt tired and fatigued.  Lethargic and a bit dizzy.  Walking 15 minutes at least twice a day.  -- was started on Repatha -- noted diarrhea after 2nd dose. ->  Reduced to taking every third week  Recent Hospitalizations: None  Reviewed  CV studies:   The following studies were reviewed today: (if available, images/films reviewed: From Epic Chart or Care Everywhere) . None:   Interval History:   Luis Guzman is here today for 2 reasons.  First and foremost, he has been having significant limiting symptoms of bilateral hip pain that is usually pretty well controlled with injections.  He needs cardiac clearance to do this.  The other is that he has been having more frequent episodes of chest discomfort and dyspnea that needed to be evaluated prior to making a decision.  He says he has been doing really well, initially post cath PCI, he felt fine with no more the chest pain.  A little over 2 weeks ago on 28 September, he started again having some symptoms that were worrisome to him.  In the morning shortly after getting up and doing his morning routine, he had a pretty good episode of chest pain that lasted a while and it went away  after about an hour.  He did not really notice it much the rest the day, but then had some more episodes very similar to what led to his cath to begin with over the next few days.  He was started back on 50 mg of Imdur on 5 October.  Unfortunately on the fifth and the sixth, he had at least 2-3 more episodes of central chest discomfort that were relieved with nitroglycerin and aspirin.  That nitroglycerin itself relieves it within a minute.  The spells occurred with a little bit of exertion, but not strenuous exertion because he is not able to do that with his hips.  Since the sixth, he has not had it as much, but is still having symptoms with some types of activity.  Can also occur in the morning when he first wakes up, and at night when he lies down to sleep.  Thankfully, he has not not had any further long episodes of pain and they are now pretty well relieved with nitroglycerin.  Cardiovascular review of symptoms: Cardiovascular ROS: positive for - chest pain and Symptoms described above negative for - dyspnea on exertion, edema, irregular heartbeat, orthopnea, palpitations, paroxysmal nocturnal dyspnea, rapid heart rate, shortness of breath or Syncope/near syncope, TIA amaurosis fugax, claudication (not walking enough) Any exertional symptoms or not able to be assessed  because he has not renal to do that with his hips bother him so much.  The patient does not have symptoms concerning for COVID-19 infection (fever, chills, cough, or new shortness of breath).  The patient is practicing social distancing. ++ Masking.  + Goes out for groceries/shopping.     REVIEWED OF SYSTEMS   ROS: A comprehensive was performed. Review of Systems  Constitutional: Positive for weight loss (He is still adjusting his diet, hopes to lose more if he could actually exercise.). Negative for malaise/fatigue (Not really fatigue or lazy, just did his hips hurt so bad it is not able to walk.).  HENT: Negative for congestion  and nosebleeds.   Eyes: Negative for blurred vision.  Respiratory: Negative for cough and shortness of breath.   Cardiovascular: Negative for leg swelling.  Gastrointestinal: Positive for diarrhea (Thought to be potentially related related to [place].  Better since he has been doing every 3 weeks Repatha.). Negative for blood in stool, heartburn and melena.  Genitourinary: Negative for dysuria and hematuria.  Musculoskeletal: Positive for back pain (Low back) and joint pain (Mostly his hips.  Knees as well but not as much). Negative for falls and neck pain.  Neurological: Positive for headaches (Off and on). Negative for dizziness and focal weakness.  Psychiatric/Behavioral: Negative for depression and memory loss. The patient is not nervous/anxious and does not have insomnia.   All other systems reviewed and are negative.  The patient does not have symptoms concerning for COVID-19 infection (fever, chills, cough, or new shortness of breath).  The patient is practicing social distancing.   COVID-19 Education: The signs and symptoms of COVID-19 were discussed with the patient and how to seek care for testing (follow up with PCP or arrange E-visit).   The importance of social distancing was discussed today.   I have reviewed and (if needed) personally updated the patient's problem list, medications, allergies, past medical and surgical history, social and family history.   PAST MEDICAL HISTORY   Past Medical History:  Diagnosis Date  . CAD S/P PTCA & DES PCI - for Progressive Angina 02/26/2019   Cath-PCI 02/26/2019: EF 55-65%. mCx 65% (FFR 0.82 - Med Rx). D2 85% - Wolverine Scoring Balloon PTCA (2.0 mm). Ost rPDA - 90% -Resolute Onyx 2.5 x 15 (2.6 mm).   . GERD (gastroesophageal reflux disease)   . History of hiatal hernia    40 yrs ago  . HYPERLIPIDEMIA 01/26/2009  . HYPERTENSION 01/26/2009  . 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 lower back pain, s/p cervical  neck surgery"stiffness" remains  . Transfusion history    infant "anemia"     PAST SURGICAL HISTORY   Past Surgical History:  Procedure Laterality Date  . APPENDECTOMY  ~ 1959  . BACK SURGERY    . CERVICAL DISC ARTHROPLASTY N/A 07/30/2018   Procedure: Cervical five-six Cervical six-seven Artificial disc replacement;  Surgeon: Kristeen Miss, MD;  Location: [facility];  Service: Neurosurgery;  Laterality: N/A;  . CERVICAL LAMINECTOMY  1997   C 4 and C 5  . CHOLECYSTECTOMY N/A 04/15/2014   Procedure: LAPAROSCOPIC CHOLECYSTECTOMY WITH INTRAOPERATIVE CHOLANGIOGRAM;  Surgeon: Armandina Gemma, MD;  Location: [facility];  Service: General;  Laterality: N/A;  . COLONOSCOPY    . CORONARY BALLOON ANGIOPLASTY N/A 02/26/2019   Procedure: CORONARY BALLOON ANGIOPLASTY;  Surgeon: Leonie Man, MD;  Location: [place] [facility];  Service: Cardiovascular -> scoring balloon PTCA (Wolverine 2.0 mm) of ostial D2 85% reducing to 20-30%.  [name]  CORONARY STENT INTERVENTION N/A 02/26/2019   Procedure: CORONARY STENT INTERVENTION;  Surgeon: Leonie Man, MD;  Location: [facility] INVASIVE [facility];; PCI ostial RPDA (initial attempt of PTCA only led to small local tear/dissection covered with stent) Resolute Onyx 2.5 mm x 15 mm (2.6 mm  . ESOPHAGOGASTRODUODENOSCOPY N/A 03/13/2014   Procedure: ESOPHAGOGASTRODUODENOSCOPY (EGD);  Surgeon: Wonda Horner, MD;  Location: [facility];  Service: Endoscopy;  Laterality: N/A;  . INTRAVASCULAR PRESSURE WIRE/FFR STUDY N/A 02/26/2019   Procedure: INTRAVASCULAR PRESSURE WIRE/FFR STUDY;  Surgeon: Leonie Man, MD;  Location: [place] [facility];  Service: Cardiovascular;; mCx ~65% - DFR 0.92, FR 0.82 - BORDERLINE--> MED Rx  . LEFT HEART CATH AND CORONARY ANGIOGRAPHY N/A 02/26/2019   Procedure: LEFT HEART CATH AND CORONARY ANGIOGRAPHY;  Surgeon: Leonie Man, MD;  Location: [place] [facility];  EF 55-65%. mCx 65% (FFR 0.82 - Med Rx). D2 85% - Wolverine Scoring Balloon PTCA (2.0 mm). Ost rPDA - 90%  -Resolute Onyx 2.5 x 15 (2.6 mm).   . 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  . SHOULDER ARTHROSCOPY Bilateral   . SHOULDER ARTHROSCOPY W/ ROTATOR CUFF REPAIR Bilateral 2004-2009   left 2004, right 2009  . VASECTOMY    . WISDOM TOOTH EXTRACTION     PTCA D1, DES PCI rPDA, FFR Cx-OM non-significant.     MEDICATIONS/ALLERGIES   Current Meds  Medication Sig  . aspirin EC 81 MG tablet Take 1 tablet (81 mg total) by mouth every evening. (Patient taking differently: Take 81 mg by mouth every other day. )  . baclofen (LIORESAL) 10 MG tablet Take 10 mg by mouth daily as needed for muscle spasms.  . bimatoprost (LUMIGAN) 0.03 % ophthalmic solution Place 1 drop into both eyes at bedtime.  . clopidogrel (PLAVIX) 75 MG tablet Take 1 tablet (75 mg total) by mouth daily.  . Evolocumab (REPATHA SURECLICK) [id number] MG/ML SOAJ Inject 140 mg into the skin every 14 (fourteen) days.  . hydrochlorothiazide (MICROZIDE) 12.5 MG capsule TAKE 1 CAPSULE BY MOUTH  DAILY  . HYDROCORTISONE EX Apply 1 application topically 2 (two) times daily as needed (insect bites/itching).  . isosorbide mononitrate (IMDUR) 30 MG 24 hr tablet Take 15 mg by mouth daily. Take half a tablet daily.  . metoprolol succinate (TOPROL-XL) 50 MG 24 hr tablet TAKE 1 TABLET BY MOUTH  DAILY WITH OR IMMEDIATLEY  FOLLOWING A MEAL (Patient taking differently: 25 mg. TAKE 1 TABLET BY MOUTH  DAILY WITH OR IMMEDIATLEY  FOLLOWING A MEAL)  . nitroGLYCERIN (NITROSTAT) 0.4 MG SL tablet Place 1 tablet (0.4 mg total) under the tongue every 5 (five) minutes as needed for chest pain.  . potassium chloride SA (K-DUR) 20 MEQ tablet Take 1 tablet (20 mEq total) by mouth daily.  . traMADol (ULTRAM) 50 MG tablet 1-2 by mouth BID as needed  . valsartan (DIOVAN) 160 MG tablet TAKE 1 TABLET BY MOUTH  DAILY    Allergies  Allergen Reactions  . Penicillins Itching    Has patient had a PCN reaction causing immediate rash, facial/tongue/throat swelling,  SOB or lightheadedness with hypotension: no Has patient had a PCN reaction causing severe rash involving mucus membranes or skin necrosis: unkn Has patient had a PCN reaction that required hospitalization: no Has patient had a PCN reaction occurring within the last 10 years: no If all of the above answers are "NO", then may proceed with Cephalosporin use.     SOCIAL HISTORY/FAMILY HISTORY  Social History   Tobacco Use  . Smoking status: Former Smoker    Packs/day: 1.00    Years: 5.00    Pack years: 5.00    Types: Cigarettes    Quit date: 10/17/1968    Years since quitting: 50.6  . Smokeless tobacco: Never Used  Substance Use Topics  . Alcohol use: Not Currently    Comment: "quit drinking ~ 10/1968  . Drug use: No   Social History   Social History Narrative  . Not on file    family history includes Arthritis in his sister; Hypertension in his father and mother; Stroke (age of onset: 24) in his sister.   OBJCTIVE -PE, EKG, labs   Wt Readings from Last 3 Encounters:  05/30/19 184 lb (83.5 kg)  05/06/19 185 lb 3.2 oz (84 kg)  04/04/19 188 lb 15 oz (85.7 kg)    Physical Exam: BP 120/80   Pulse 72   Ht 6' (1.829 m)   Wt 184 lb (83.5 kg)   SpO2 98%   BMI 24.95 kg/m  Physical Exam  Constitutional: He is oriented to person, place, and time. He appears well-developed and well-nourished. No distress.  Pleasant, healthy-appearing gentleman.  Well-groomed  HENT:  Head: Normocephalic and atraumatic.  Eyes: Pupils are equal, round, and reactive to light. EOM are normal.  Neck: Normal range of motion. Neck supple. No hepatojugular reflux and no JVD present. Carotid bruit is not present.  Cardiovascular: Normal rate, regular rhythm, normal heart sounds and intact distal pulses.  No extrasystoles are present. PMI is not displaced. Exam reveals no gallop and no friction rub.  No murmur heard. Pulmonary/Chest: Effort normal and breath sounds normal. No respiratory distress. He  has no wheezes. He has no rales.  Abdominal: Soft. Bowel sounds are normal. He exhibits no distension. There is no abdominal tenderness. There is no rebound.  Musculoskeletal: Normal range of motion.        General: No edema.  Neurological: He is alert and oriented to person, place, and time.  Psychiatric: He has a normal mood and affect. His behavior is normal. Judgment and thought content normal.  Vitals reviewed.     Adult ECG Report  Rate: 72 ;  Rhythm: normal sinus rhythm and Normal axis, intervals and durations;   Narrative Interpretation: Normal EKG  Recent Labs:   Lab Results  Component Value Date   CHOL 110 05/06/2019   HDL 37.40 (L) 05/06/2019   LDLCALC 53 05/06/2019   LDLDIRECT 129.0 08/19/2016   TRIG 101.0 05/06/2019   CHOLHDL 3 05/06/2019   Lab Results  Component Value Date   CREATININE 0.85 05/06/2019   BUN 9 05/06/2019   NA 142 05/06/2019   K 4.6 05/06/2019   CL 105 05/06/2019   CO2 29 05/06/2019   Lab Results  Component Value Date   WBC 9.8 02/27/2019   HGB 14.3 02/27/2019   HCT 42.2 02/27/2019   MCV 91.7 02/27/2019   PLT 194 02/27/2019    ASSESSMENT/PLAN    Problem List Items Addressed This Visit    CAD S/P PTCA & DES PCI - for Progressive Angina - Primary (Chronic)    Initially did very well post cath-PCI/PTCA, but now again having recurrence of some chest pain symptoms.  I am a little bit concerned, because we are questioning whether we stop his Plavix for this upcoming injection.  Unlikely that intermittent symptoms are related to stent thrombosis or stenosis in the PDA.  My suspicion is if anything  could be recoil of the diagonal branch that was treated with angioplasty.  He remains on aspirin and Plavix and the discussion today is what to do with the antiplatelet agents with his hips injection planned. He is on Toprol and HCTZ along with valsartan with controlled blood pressures. He is additionally now back taking 15 mg of Imdur.  I would  like to increase him to a full 30 mg of Imdur with him having recurrent symptoms that may be related to spasm.  (In the future, could consider amlodipine or felodipine if blood pressure would allow)  With his recurrent symptoms, I think we need to see how he does on the increased dose of Imdur and determine if we consider nuclear stress testing to drive are invasive evaluation or simply return to the Cath Lab if symptoms do not improve.  Despite this thought, I do not think it would be appropriate to delay his necessary hip injections any further.  While preferably, we would not want to stop Plavix for the first year post PCI, third generation DES stents have been shown to be stable with reendothelialization and is less is 3 months.  It was a short stent, and the other area was PTCA only.  With this thought process, and his definite need to have some relief of pain that allows him to get back and exercise, I think we need to take the risk of having home hold the antiplatelet agents (preferably just Plavix) preop and then restart post procedure.      Relevant Medications   isosorbide mononitrate (IMDUR) 30 MG 24 hr tablet   Other Relevant Orders   EKG 12-Lead   Hyperlipidemia with target LDL less than 70 - statin intolerant (Chronic)    Most recent labs finally show well-controlled lipids on Repatha. He was not really able to tolerate any dose of statin.      Relevant Medications   isosorbide mononitrate (IMDUR) 30 MG 24 hr tablet   Essential hypertension (Chronic)    Blood pressure looks great on Toprol reduced dose along with HCTZ.  Because of the somewhat sensitive blood pressures, we added Imdur for antianginal effect as opposed to calcium channel blocker.      Relevant Medications   isosorbide mononitrate (IMDUR) 30 MG 24 hr tablet   Preop cardiovascular exam    While we do not routinely consider patient's risks when it comes to holding medications for injections, this current scenario  is somewhat difficult.  He is extremely limited with his hip pain and I think this should take a high priority in order for him to continue his rehabilitation.  Unfortunately, he is having some chest pain.  Plan: Further titrate Imdur, and would recommend having him go ahead and have the injections done to allow him to be active post procedure.  Following hip injection, he should then get a get back into walking and we can better assess symptoms. At that point, if his symptoms do not improve or have we worsened, I would probably actually err on the side of going to the Cath Lab in order to reevaluate the 3 lesions in question.  However if symptoms are getting better I think we can potentially skip invasive procedure by reassessing with a Myoview stress test.  The intention for allowing him to have his hip injection first is to be under better assess his symptoms once he is more active.  If we were to need to go back to the Cath Lab, there would  be a potential impetus to delay even further if intervention is required.        My plan is for him to have his hip injection then he will follow-up with either myself or Kerin Ransom, [place] to discuss his symptoms and make plans for either proceeding to cardiac catheterization if symptoms have not improved versus nuclear stress test if improved.  COVID-19 Education: The signs and symptoms of COVID-19 were discussed with the patient and how to seek care for testing (follow up with PCP or arrange E-visit).   The importance of social distancing was discussed today.  I spent a total of 30-35 minutes with the patient and chart review. >  50% of the time was spent in direct patient consultation.  Additional time spent with chart review (studies, outside notes, etc): 5-10 Total Time: 40 to 42 minutes total  Current medicines are reviewed at length with the patient today.  (+/- concerns) none   Patient Instructions / Medication Changes & Studies & Tests Ordered    Patient Instructions  Medication Instructions:  NO CHANGES *If you need a refill on your cardiac medications before your next appointment, please call your pharmacy*  Lab Work: NOT NEEDED  Testing/Procedures: NOT NEEDED  Follow-Up: At [facility], you and your health needs are our priority.  As part of our continuing mission to provide you with exceptional heart care, we have created designated Provider Care Teams.  These Care Teams include your primary Cardiologist (physician) and Advanced Practice Providers (APPs -  Physician Assistants and Nurse Practitioners) who all work together to provide you with the care you need, when you need it.  Your next appointment:   3 WEEKS  The format for your next appointment:   In Person  Provider:   Glenetta Hew, MD or Kerin Ransom PA- TO DISCUSS STRESS TEST VS CATH   Other Instructions  OKAY TO  HAVE HIP INJECTIONS AT [place].-   DO NOT TAKE PLAVIX OR ASPIRIN FIVE DAYS PRIOR TO INJECTIONS IF YOU DEVELOP CHEST PAIN SYMPTOMS PLEAS GO TO ER.       Studies Ordered:   Orders Placed This Encounter  Procedures  . EKG 12-Lead     Glenetta Hew, M.D., M.S. Interventional Cardiologist   Pager # [phone] Phone # [phone] [address] [place], [place] [address]   Thank you for choosing Heartcare at [facility]!!

## 2019-05-30 NOTE — Patient Instructions (Addendum)
Medication Instructions:  NO CHANGES *If you need a refill on your cardiac medications before your next appointment, please call your pharmacy*  Lab Work: NOT NEEDED  Testing/Procedures: NOT NEEDED  Follow-Up: At [facility], you and your health needs are our priority.  As part of our continuing mission to provide you with exceptional heart care, we have created designated Provider Care Teams.  These Care Teams include your primary Cardiologist (physician) and Advanced Practice Providers (APPs -  Physician Assistants and Nurse Practitioners) who all work together to provide you with the care you need, when you need it.  Your next appointment:   3 WEEKS  The format for your next appointment:   In Person  Provider:   Glenetta Hew, MD or Kerin Ransom PA- TO DISCUSS STRESS TEST VS CATH   Other Instructions  OKAY TO  HAVE HIP INJECTIONS AT [place].-   DO NOT TAKE PLAVIX OR ASPIRIN FIVE DAYS PRIOR TO INJECTIONS IF YOU DEVELOP CHEST PAIN SYMPTOMS PLEAS GO TO ER.

## 2019-05-31 ENCOUNTER — Encounter: Payer: Self-pay | Admitting: Cardiology

## 2019-05-31 DIAGNOSIS — Z01810 Encounter for preprocedural cardiovascular examination: Secondary | ICD-10-CM | POA: Insufficient documentation

## 2019-05-31 NOTE — Assessment & Plan Note (Signed)
While we do not routinely consider patient's risks when it comes to holding medications for injections, this current scenario is somewhat difficult.  He is extremely limited with his hip pain and I think this should take a high priority in order for him to continue his rehabilitation.  Unfortunately, he is having some chest pain.  Plan: Further titrate Imdur, and would recommend having him go ahead and have the injections done to allow him to be active post procedure.  Following hip injection, he should then get a get back into walking and we can better assess symptoms. At that point, if his symptoms do not improve or have we worsened, I would probably actually err on the side of going to the Cath Lab in order to reevaluate the 3 lesions in question.  However if symptoms are getting better I think we can potentially skip invasive procedure by reassessing with a Myoview stress test.  The intention for allowing him to have his hip injection first is to be under better assess his symptoms once he is more active.  If we were to need to go back to the Cath Lab, there would be a potential impetus to delay even further if intervention is required.

## 2019-05-31 NOTE — Assessment & Plan Note (Signed)
Most recent labs finally show well-controlled lipids on Repatha. He was not really able to tolerate any dose of statin.

## 2019-05-31 NOTE — Assessment & Plan Note (Signed)
Blood pressure looks great on Toprol reduced dose along with HCTZ.  Because of the somewhat sensitive blood pressures, we added Imdur for antianginal effect as opposed to calcium channel blocker.

## 2019-05-31 NOTE — Assessment & Plan Note (Signed)
Initially did very well post cath-PCI/PTCA, but now again having recurrence of some chest pain symptoms.  I am a little bit concerned, because we are questioning whether we stop his Plavix for this upcoming injection.  Unlikely that intermittent symptoms are related to stent thrombosis or stenosis in the PDA.  My suspicion is if anything could be recoil of the diagonal branch that was treated with angioplasty.  He remains on aspirin and Plavix and the discussion today is what to do with the antiplatelet agents with his hips injection planned. He is on Toprol and HCTZ along with valsartan with controlled blood pressures. He is additionally now back taking 15 mg of Imdur.  I would like to increase him to a full 30 mg of Imdur with him having recurrent symptoms that may be related to spasm.  (In the future, could consider amlodipine or felodipine if blood pressure would allow)  With his recurrent symptoms, I think we need to see how he does on the increased dose of Imdur and determine if we consider nuclear stress testing to drive are invasive evaluation or simply return to the Cath Lab if symptoms do not improve.  Despite this thought, I do not think it would be appropriate to delay his necessary hip injections any further.  While preferably, we would not want to stop Plavix for the first year post PCI, third generation DES stents have been shown to be stable with reendothelialization and is less is 3 months.  It was a short stent, and the other area was PTCA only.  With this thought process, and his definite need to have some relief of pain that allows him to get back and exercise, I think we need to take the risk of having home hold the antiplatelet agents (preferably just Plavix) preop and then restart post procedure.

## 2019-06-06 ENCOUNTER — Ambulatory Visit
Admission: RE | Admit: 2019-06-06 | Discharge: 2019-06-06 | Disposition: A | Discharge status: Home / Self Care | Payer: Medicare Other | Source: Ambulatory Visit | Attending: Orthopaedic Surgery | Admitting: Orthopaedic Surgery

## 2019-06-06 ENCOUNTER — Other Ambulatory Visit: Payer: Self-pay

## 2019-06-06 DIAGNOSIS — M25552 Pain in left hip: Secondary | ICD-10-CM

## 2019-06-06 MED ORDER — METHYLPREDNISOLONE ACETATE 40 MG/ML INJ SUSP (RADIOLOG
120.0000 mg | Freq: Once | INTRAMUSCULAR | Status: AC
  Administered 2019-06-06: 120 mg via INTRA_ARTICULAR

## 2019-06-06 MED ORDER — IOPAMIDOL (ISOVUE-M 200) INJECTION 41%
1.0000 mL | Freq: Once | INTRAMUSCULAR | Status: AC
  Administered 2019-06-06: 1 mL via INTRA_ARTICULAR

## 2019-06-06 MED ORDER — METHYLPREDNISOLONE ACETATE 40 MG/ML INJ SUSP (RADIOLOG: 120.0000 mg | Freq: Once | INTRAMUSCULAR | Status: DC

## 2019-06-08 ENCOUNTER — Other Ambulatory Visit: Payer: Self-pay | Admitting: Family Medicine

## 2019-06-13 ENCOUNTER — Telehealth: Payer: Self-pay | Admitting: Orthopaedic Surgery

## 2019-06-13 NOTE — Telephone Encounter (Signed)
Patient called advised the injection did not work and wanted to know how soon can he get another injection? Patient said he will need to be off plavix for 5 days prior to the injection. The number to contact patient is [phone]

## 2019-06-14 NOTE — Telephone Encounter (Signed)
Called and spoke with patient.

## 2019-06-14 NOTE — Telephone Encounter (Signed)
Please advise 

## 2019-06-14 NOTE — Telephone Encounter (Signed)
Approx 2 months after the last inj

## 2019-06-28 ENCOUNTER — Other Ambulatory Visit: Payer: Self-pay

## 2019-06-28 ENCOUNTER — Ambulatory Visit (INDEPENDENT_AMBULATORY_CARE_PROVIDER_SITE_OTHER): Payer: Medicare Other | Admitting: Cardiology

## 2019-06-28 ENCOUNTER — Encounter: Payer: Self-pay | Admitting: Cardiology

## 2019-06-28 VITALS — BP 124/60 | HR 57 | Ht 72.0 in | Wt 187.0 lb

## 2019-06-28 DIAGNOSIS — I25119 Atherosclerotic heart disease of native coronary artery with unspecified angina pectoris: Secondary | ICD-10-CM | POA: Insufficient documentation

## 2019-06-28 DIAGNOSIS — I10 Essential (primary) hypertension: Secondary | ICD-10-CM

## 2019-06-28 DIAGNOSIS — E785 Hyperlipidemia, unspecified: Secondary | ICD-10-CM | POA: Diagnosis not present

## 2019-06-28 DIAGNOSIS — I209 Angina pectoris, unspecified: Secondary | ICD-10-CM

## 2019-06-28 DIAGNOSIS — I25110 Atherosclerotic heart disease of native coronary artery with unstable angina pectoris: Secondary | ICD-10-CM

## 2019-06-28 NOTE — Progress Notes (Signed)
Primary Care Provider: Eulas Post, MD Cardiologist: Glenetta Hew, MD  Clinic Note: Chief Complaint  Patient presents with  . Follow-up    After hip injection  . Coronary Artery Disease    Limiting angina symptoms    HPI:    Luis Guzman is a 73 y.o. male with a history of CAD-PCI (most recently in July PCI to the PDA with PTCA only to ostial diagonal branch) who presents today for close follow-up to reassess angina level.  Luis Guzman was last seen on May 30, 2019 -> he noted recurrence of his exertional chest pain.  Started having worrisome exertional dyspnea and chest pain symptoms with a couple pretty significant episodes.  He started back on low-dose Imdur.  Was taking nitroglycerin and aspirin.  No real resting episodes and no prolonged episodes. -> The other issue is that he was having a difficult time walking because of hip pain and was hoping to have hip injections.  Usually a series of these injections tests allow him to get back into walking.  We did agree for him to hold his Plavix to get the injection performed and then reassess after.  This way he can get some relief in order for [place] to determine exertional symptoms.  Recent Hospitalizations: None  Reviewed  CV studies:    The following studies were reviewed today: (if available, images/films reviewed: From Epic Chart or Care Everywhere) . None:   Interval History:   Luis Guzman returns here today indicating that while the hip injection helped some, he still has not gotten back walking like he would like to.  But the major issue is that he is now having pretty significant exertional angina to the point where he is able to walk around the room okay, but has more chest pain if he carries anything, or if he goes up a flight of steps.  Any prolonged walking seems to be troublesome. He was at the beach this past weekend, and was try to do some moving of furniture around the apartment and had a lot of trouble with  with angina doing that.  Any walking or any kind of incline or upstairs is likely because his anginal chest pain.  Usually these episodes are self limiting if he stops and rests, but he has been taking nitroglycerin.  Symptoms seem to be getting worse.  No heart failure symptoms of PND, orthopnea or edema.  No resting chest pain. No rapid irregular heartbeats or palpitations.  CV Review of Symptoms (Summary)  positive for - Exertional anginal chest pain and shortness of breath negative for - edema, irregular heartbeat, orthopnea, palpitations, paroxysmal nocturnal dyspnea, rapid heart rate, shortness of breath or Syncope/near syncope, TIA/amaurosis fugax  The patient does not have symptoms concerning for COVID-19 infection (fever, chills, cough, or new shortness of breath).  The patient is practicing social distancing. ++ Masking.  He does go out for groceries/shopping, but is careful.   REVIEWED OF SYSTEMS   A comprehensive ROS was performed. Review of Systems  Constitutional: Negative for malaise/fatigue and weight loss. Diaphoresis: Not really fatigue, just concerned now about exercise because of chest pain.  HENT: Negative for congestion and nosebleeds.   Respiratory: Negative for cough, shortness of breath (Only exertional ) and wheezing.   Cardiovascular: Negative for leg swelling.  Gastrointestinal: Negative for blood in stool, diarrhea (No more diarrhea complaints) and melena.  Genitourinary: Negative for hematuria.  Musculoskeletal: Positive for joint pain (Significant hip pain.  Improved some with injection, but still bothersome).  Neurological: Negative for dizziness, focal weakness and headaches.  Psychiatric/Behavioral: Negative for memory loss. The patient is nervous/anxious (He seems a little bit anxious about his symptoms currently.). The patient does not have insomnia.   All other systems reviewed and are negative.  I have reviewed and (if needed) personally updated the  patient's problem list, medications, allergies, past medical and surgical history, social and family history.   PAST MEDICAL HISTORY   Past Medical History:  Diagnosis Date  . CAD S/P PTCA & DES PCI - for Progressive Angina 02/26/2019   Cath-PCI 02/26/2019: EF 55-65%. mCx 65% (FFR 0.82 - Med Rx). D2 85% - Wolverine Scoring Balloon PTCA (2.0 mm). Ost rPDA - 90% -Resolute Onyx 2.5 x 15 (2.6 mm).   . GERD (gastroesophageal reflux disease)   . History of hiatal hernia    40 yrs ago  . HYPERLIPIDEMIA 01/26/2009  . HYPERTENSION 01/26/2009  . 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 lower back pain, s/p cervical neck surgery"stiffness" remains  . Transfusion history    infant "anemia"    PAST SURGICAL HISTORY   Past Surgical History:  Procedure Laterality Date  . APPENDECTOMY  ~ 1959  . BACK SURGERY    . CERVICAL DISC ARTHROPLASTY N/A 07/30/2018   Procedure: Cervical five-six Cervical six-seven Artificial disc replacement;  Surgeon: Kristeen Miss, MD;  Location: [facility];  Service: Neurosurgery;  Laterality: N/A;  . CERVICAL LAMINECTOMY  1997   C 4 and C 5  . CHOLECYSTECTOMY N/A 04/15/2014   Procedure: LAPAROSCOPIC CHOLECYSTECTOMY WITH INTRAOPERATIVE CHOLANGIOGRAM;  Surgeon: Armandina Gemma, MD;  Location: [facility];  Service: General;  Laterality: N/A;  . COLONOSCOPY    . CORONARY BALLOON ANGIOPLASTY N/A 02/26/2019   Procedure: CORONARY BALLOON ANGIOPLASTY;  Surgeon: Leonie Man, MD;  Location: [facility];  Service: Cardiovascular -> scoring balloon PTCA (Wolverine 2.0 mm) of ostial D2 85% reducing to 20-30%.  . CORONARY STENT INTERVENTION N/A 02/26/2019   Procedure: CORONARY STENT INTERVENTION;  Surgeon: Leonie Man, MD;  Location: [facility];; PCI ostial RPDA (initial attempt of PTCA only led to small local tear/dissection covered with stent) Resolute Onyx 2.5 mm x 15 mm (2.6 mm  . ESOPHAGOGASTRODUODENOSCOPY N/A 03/13/2014   Procedure:  ESOPHAGOGASTRODUODENOSCOPY (EGD);  Surgeon: Wonda Horner, MD;  Location: [facility];  Service: Endoscopy;  Laterality: N/A;  . INTRAVASCULAR PRESSURE WIRE/FFR STUDY N/A 02/26/2019   Procedure: INTRAVASCULAR PRESSURE WIRE/FFR STUDY;  Surgeon: Leonie Man, MD;  Location: [facility];  Service: Cardiovascular;; mCx ~65% - DFR 0.92, FR 0.82 - BORDERLINE--> MED Rx  . LEFT HEART CATH AND CORONARY ANGIOGRAPHY N/A 02/26/2019   Procedure: LEFT HEART CATH AND CORONARY ANGIOGRAPHY;  Surgeon: Leonie Man, MD;  Location: [facility];  EF 55-65%. mCx 65% (FFR 0.82 - Med Rx). D2 85% - Wolverine Scoring Balloon PTCA (2.0 mm). Ost rPDA - 90% -Resolute Onyx 2.5 x 15 (2.6 mm).   . 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  . SHOULDER ARTHROSCOPY Bilateral   . SHOULDER ARTHROSCOPY W/ ROTATOR CUFF REPAIR Bilateral 2004-2009   left 2004, right 2009  . VASECTOMY    . WISDOM TOOTH EXTRACTION      Cath-PCI 02/26/2019: EF 55-65%. mCx 65% (FFR 0.82 - Med Rx). D2 85% - Wolverine Scoring Balloon PTCA (2.0 mm). Ost rPDA - 90% -Resolute Onyx 2.5 x 15 (2.6 mm).   (Radial cath  complicated by hematoma)        MEDICATIONS/ALLERGIES   Current Meds  Medication Sig  . aspirin EC 81 MG tablet Take 1 tablet (81 mg total) by mouth every evening. (Patient taking differently: Take 81 mg by mouth every other day. )  . baclofen (LIORESAL) 10 MG tablet Take 10 mg by mouth daily as needed for muscle spasms.  . bimatoprost (LUMIGAN) 0.03 % ophthalmic solution Place 1 drop into both eyes at bedtime.  . clopidogrel (PLAVIX) 75 MG tablet Take 1 tablet (75 mg total) by mouth daily.  . Evolocumab (REPATHA SURECLICK) [id number] MG/ML SOAJ Inject 140 mg into the skin every 14 (fourteen) days.  . hydrochlorothiazide (MICROZIDE) 12.5 MG capsule TAKE 1 CAPSULE BY MOUTH  DAILY  . HYDROCORTISONE EX Apply 1 application topically 2 (two) times daily as needed (insect bites/itching).  . isosorbide mononitrate  (IMDUR) 30 MG 24 hr tablet Take 30 mg by mouth daily. Take half a tablet daily.   . metoprolol succinate (TOPROL-XL) 50 MG 24 hr tablet TAKE 1 TABLET BY MOUTH  DAILY WITH OR IMMEDIATLEY  FOLLOWING A MEAL (Patient taking differently: 25 mg. TAKE 1 TABLET BY MOUTH  DAILY WITH OR IMMEDIATLEY  FOLLOWING A MEAL)  . nitroGLYCERIN (NITROSTAT) 0.4 MG SL tablet PLACE 1 TABLET (0.4 MG TOTAL) UNDER THE TONGUE EVERY 5 (FIVE) MINUTES AS NEEDED FOR CHEST PAIN.  [name] potassium chloride SA (K-DUR) 20 MEQ tablet Take 1 tablet (20 mEq total) by mouth daily.  . traMADol (ULTRAM) 50 MG tablet 1-2 by mouth BID as needed  . valsartan (DIOVAN) 160 MG tablet TAKE 1 TABLET BY MOUTH  DAILY    Allergies  Allergen Reactions  . Penicillins Itching    Has patient had a PCN reaction causing immediate rash, facial/tongue/throat swelling, SOB or lightheadedness with hypotension: no Has patient had a PCN reaction causing severe rash involving mucus membranes or skin necrosis: unkn Has patient had a PCN reaction that required hospitalization: no Has patient had a PCN reaction occurring within the last 10 years: no If all of the above answers are "NO", then may proceed with Cephalosporin use.     SOCIAL HISTORY/FAMILY HISTORY   Social History   Tobacco Use  . Smoking status: Former Smoker    Packs/day: 1.00    Years: 5.00    Pack years: 5.00    Types: Cigarettes    Quit date: 10/17/1968    Years since quitting: 50.7  . Smokeless tobacco: Never Used  Substance Use Topics  . Alcohol use: Not Currently    Comment: "quit drinking ~ 10/1968  . Drug use: No   Social History   Social History Narrative  . Not on file    Family History family history includes Arthritis in his sister; Hypertension in his father and mother; Stroke (age of onset: 62) in his sister.   OBJCTIVE -PE, EKG, labs   Wt Readings from Last 3 Encounters:  06/28/19 187 lb (84.8 kg)  05/30/19 184 lb (83.5 kg)  05/06/19 185 lb 3.2 oz (84 kg)     Physical Exam: BP 124/60   Pulse (!) 57   Ht 6' (1.829 m)   Wt 187 lb (84.8 kg)   SpO2 100%   BMI 25.36 kg/m  Physical Exam  Constitutional: He is oriented to person, place, and time. He appears well-developed and well-nourished. No distress.  Healthy-appearing.  Well-groomed.  HENT:  Head: Normocephalic and atraumatic.  Eyes: Pupils are equal, round, and reactive to light.  Conjunctivae and EOM are normal.  Neck: Normal range of motion. Neck supple. No hepatojugular reflux and no JVD present. Carotid bruit is not present.  Cardiovascular: Normal rate, regular rhythm, normal heart sounds and intact distal pulses.  No extrasystoles are present. PMI is not displaced. Exam reveals no gallop and no friction rub.  No murmur heard. Pulmonary/Chest: Effort normal and breath sounds normal. No respiratory distress. He has no wheezes. He has no rales.  Abdominal: Soft. Bowel sounds are normal. He exhibits no distension. There is no abdominal tenderness. There is no rebound.  No HSM  Musculoskeletal: Normal range of motion.        General: No edema.     Comments: Strength 5 out of 5  Neurological: He is alert and oriented to person, place, and time. No cranial nerve deficit (Grossly intact).  Skin: Skin is warm and dry.  Psychiatric: He has a normal mood and affect. His behavior is normal. Judgment and thought content normal.  Vitals reviewed.    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  We made some few adjustments for him to have his hip injections, but now I think it is time to go back to the Cath Lab in order to reevaluate. Spent a long time reviewing his cath images and discussing my thoughts.  Essentially most likely etiology is recoil of the diagonal branch that was treated with PTCA.  Very unlikely that the stent related problems.  At this point I think we need to return to the Cath Lab and plan to probably attempt PCI with a stent in the diagonal branch.  This would likely be a small caliber stent which will likely be very careful to land such that it does not jeopardize the LAD.  We will plan to protect the LAD. --> Based on the fact that he had pretty significant hematoma complicating his last radial cath, I would like to avoid that and will go femoral access.  Likely consider Mynx closure if possible - this will also allow for 7 Fr catheter to be used if deemed necessary.  We reviewed options and I think this is the best potential plan as medications do not seem to be working as well. In the interim we will have him increase to full dose Imdur and go back to daily aspirin.  He is already on Plavix.  He is now on Toprol 50 mg, and did not tolerate Imdur.  I discussed with him the potential increased risk of diagonal PCI in that we may potentially have to treat the LAD at that section as well.  More than likely, would not address the circumflex lesion as this was found to be not physiologic significant by FFR.  Relevant Orders   Basic metabolic panel   CBC   Coronary artery disease involving native coronary artery of native heart with unstable angina pectoris ([place]) (Chronic)    At this point he is having progressive unstable class III-IV angina. He is on a pretty good medication regiment with stable dose of Toprol, and increasing his Imdur dose to 30  mg.  He was having hard time tolerating it at the higher dose initially. He is already on aspirin plus Plavix which, and we will increase his aspirin to every day as opposed every other day.  He is also on ARB for afterload reduction.  Was intolerant of amlodipine for antianginal benefit.  The only medication that we have not used is Ranexa, however recurrent angina within 3 months of PTCA alone would suggest that the most likely etiology of this is recoil in that lesion.  Plan-return to Cath Lab for cardiac catheterization and likely PCI of the diagonal branch.      Relevant Orders   Basic metabolic panel   CBC   Hyperlipidemia with target LDL less than 70 - statin intolerant (Chronic)    Now on reduced dosing interval of Repatha.  LDL as of September look well controlled.      Relevant Orders   Basic metabolic panel   CBC   Essential hypertension (Chronic)    He is on Toprol and losartan.  Blood pressure well controlled.      Relevant Orders   Basic metabolic panel   CBC     Performing MD:  Glenetta Hew, M.D., M.S.  Procedure: LEFT HEART CATH WITH CORONARY ANGIOGRAPHY AND PERCUTANEOUS CORONARY INTERVENTION OF THE FIRST DIAGONAL BRANCH  The procedure with Risks/Benefits/Alternatives and Indications was reviewed with the patient.  All questions were answered.    Risks / Complications include, but not limited to: Death, MI, CVA/TIA, VF/VT (with defibrillation), Bradycardia (need for temporary pacer placement), contrast induced nephropathy, bleeding / bruising / hematoma / pseudoaneurysm, vascular or coronary injury (with possible emergent CT or Vascular Surgery), adverse medication reactions, infection.  Additional risks involving the use of radiation with the possibility of radiation burns and cancer were explained in detail.  The patient voices understanding and agree to proceed.     COVID-19 Education: The signs and symptoms of COVID-19 were discussed with the patient and  how to seek care for testing (follow up with PCP or arrange E-visit).   The importance of social distancing was discussed today.  I spent a total of 30 minutes with the patient and chart review. >  50% of the time was spent in direct patient consultation.  --> We reviewed Images, discussed treatment options.  I explained concerns about PCI.  We discussed the risks and benefits with her for cath consent.  We also then discussed future thoughts based on his need for hip injections.  Although not ideal, theoretically by the time his next injection should be due, with Resolute Onyx DES stents in a non-ACS setting, we should be okay temporarily holding Plavix. Additional time spent with chart review (studies, outside notes, etc): 10 Total Time: 40 min   Current medicines are reviewed at length with the patient today.  (+/- concerns) n/a   Patient Instructions / Medication Changes & Studies & Tests Ordered   Patient Instructions  Medication Instructions:  TAKE ASPIRIN 81 MG EVERYDAY  UNTIL PROCEDURE AND 1 MONTH AFTERWARDS. NO OTHER CHANGES  *If you need a refill on your cardiac medications before your next appointment,  please call your pharmacy*  Lab Work: SEE INSTRUCTION FOR CARDIAC CATH  If you have labs (blood work) drawn today and your tests are completely normal, you will receive your results only by: [name] MyChart Message (if you have MyChart) OR . A paper copy in the mail If you have any lab test that is abnormal or we need to change your treatment, we will call you to review the results.  Testing/Procedures: SCHEDULE FOR NOV 20 , 2021 7:30 AM - BE AT HOSPITAL AT 5:30 AM - Your physician has requested that you have a cardiac catheterization. Cardiac catheterization is used to diagnose and/or treat various heart conditions. Doctors may recommend this procedure for a number of different reasons. The most common reason is to evaluate chest pain. Chest pain can be a symptom of coronary artery  disease (CAD), and cardiac catheterization can show whether plaque is narrowing or blocking your heart's arteries. This procedure is also used to evaluate the valves, as well as measure the blood flow and oxygen levels in different parts of your heart. For further information please visit [facility]ugeFiesta[facility][email or web address]. Please follow instruction sheet, as given.   Follow-Up: At [facility], you and your health needs are our priority.  As part of our continuing mission to provide you with exceptional heart care, we have created designated Provider Care Teams.  These Care Teams include your primary Cardiologist (physician) and Advanced Practice Providers (APPs -  Physician Assistants and Nurse Practitioners) who all work together to provide you with the care you need, when you need it.  Your next appointment:    2 WEEKS AFTER CATH  The format for your next appointment:   In Person  Provider:   Glenetta Hew, MD  Other Instructions Precath instructions provided   Studies Ordered:   Orders Placed This Encounter  Procedures  . Basic metabolic panel  . CBC     Glenetta Hew, M.D., M.S. Interventional Cardiologist   Pager # [phone] Phone # [phone] [address] [place], [place] [address]   Thank you for choosing Heartcare at [facility]!!

## 2019-06-28 NOTE — Patient Instructions (Addendum)
Medication Instructions:  TAKE ASPIRIN 81 MG EVERYDAY  UNTIL PROCEDURE AND 1 MONTH AFTERWARDS. NO OTHER CHANGES  *If you need a refill on your cardiac medications before your next appointment, please call your pharmacy*  Lab Work: SEE [facility] CATH  If you have labs (blood work) drawn today and your tests are completely normal, you will receive your results only by: [name] MyChart Message (if you have MyChart) OR . A paper copy in the mail If you have any lab test that is abnormal or we need to change your treatment, we will call you to review the results.  Testing/Procedures: SCHEDULE FOR NOV 20 , 2021 7:30 AM - BE AT HOSPITAL AT 5:30 AM - Your physician has requested that you have a cardiac catheterization. Cardiac catheterization is used to diagnose and/or treat various heart conditions. Doctors may recommend this procedure for a number of different reasons. The most common reason is to evaluate chest pain. Chest pain can be a symptom of coronary artery disease (CAD), and cardiac catheterization can show whether plaque is narrowing or blocking your heart's arteries. This procedure is also used to evaluate the valves, as well as measure the blood flow and oxygen levels in different parts of your heart. For further information please visit HugeFiesta[facility][email or web address]. Please follow instruction sheet, as given.   Follow-Up: At [facility], you and your health needs are our priority.  As part of our continuing mission to provide you with exceptional heart care, we have created designated Provider Care Teams.  These Care Teams include your primary Cardiologist (physician) and Advanced Practice Providers (APPs -  Physician Assistants and Nurse Practitioners) who all work together to provide you with the care you need, when you need it.  Your next appointment:    2 WEEKS AFTER CATH  The format for your next appointment:   In Person  Provider:   Glenetta Hew, MD  Other  Instructions        [place] [address] Dept: [phone] Loc: [place]  06/28/2019  You are scheduled for a Cardiac Catheterization on Friday, November 20 with Dr. Glenetta Hew.  1. Please arrive at the [facility] (Main Entrance A) at [facility]: [address] [place], [place] [address] at 5:30 AM (This time is two hours before your procedure to ensure your preparation). Free valet parking service is available.   Special note: Every effort is made to have your procedure done on time. Please understand that emergencies sometimes delay scheduled procedures.  2. Diet: Do not eat solid foods after midnight.  The patient may have clear liquids until 5am upon the day of the procedure.  3. Labs: You will need to have blood drawn on CBC BMP , November 17 at [place], [place]  Open: 8am - 5pm (Lunch 12:30 - 1:30)   Phone: [phone]. You do not need to be fasting.   YOU WILL NEED A COVID TEST ON July 02, 2019 AT 2:45 PM- PLEASE GO TO [address] -SELF ISOLATE UNTIL CATHETERIZATION ON Friday NOV 20,2020   4. Medication instructions in preparation for your procedure:  DO NOT  TAKE, Diovan (Valsartan) Friday, November 20,, HTCZ (Hydrochlorothiazide) Friday, November 20,  On the morning of your procedure, take your Aspirin 81 MG  and Plavix/Clopidogrel 75 MG and any morning medicines NOT listed above.  You may use  sips of water.  5. Plan for one night stay--bring personal belongings. 6. Bring a current list of your medications and current insurance cards. 7. You MUST have a responsible person to drive you home. 8. Someone MUST be with you the first 24 hours after you arrive home or your discharge will be delayed. 9. Please wear clothes that are easy to get on and off and wear slip-on shoes.  Thank you  for allowing [place] to care for you!   -- [place] [facility]

## 2019-06-28 NOTE — H&P (View-Only) (Signed)
Primary Care Provider: Eulas Post, MD Cardiologist: Glenetta Hew, MD  Clinic Note: Chief Complaint  Patient presents with   Follow-up    After hip injection   Coronary Artery Disease    Limiting angina symptoms    HPI:    Luis Guzman is a 73 y.o. male with a history of CAD-PCI (most recently in July PCI to the PDA with PTCA only to ostial diagonal branch) who presents today for close follow-up to reassess angina level.  Luis Guzman was last seen on May 30, 2019 -> he noted recurrence of his exertional chest pain.  Started having worrisome exertional dyspnea and chest pain symptoms with a couple pretty significant episodes.  He started back on low-dose Imdur.  Was taking nitroglycerin and aspirin.  No real resting episodes and no prolonged episodes. -> The other issue is that he was having a difficult time walking because of hip pain and was hoping to have hip injections.  Usually a series of these injections tests allow him to get back into walking.  We did agree for him to hold his Plavix to get the injection performed and then reassess after.  This way he can get some relief in order for [place] to determine exertional symptoms.  Recent Hospitalizations: None  Reviewed  CV studies:    The following studies were reviewed today: (if available, images/films reviewed: From Epic Chart or Care Everywhere)  None:   Interval History:   Luis Guzman returns here today indicating that while the hip injection helped some, he still has not gotten back walking like he would like to.  But the major issue is that he is now having pretty significant exertional angina to the point where he is able to walk around the room okay, but has more chest pain if he carries anything, or if he goes up a flight of steps.  Any prolonged walking seems to be troublesome. He was at the beach this past weekend, and was try to do some moving of furniture around the apartment and had a lot of trouble with  with angina doing that.  Any walking or any kind of incline or upstairs is likely because his anginal chest pain.  Usually these episodes are self limiting if he stops and rests, but he has been taking nitroglycerin.  Symptoms seem to be getting worse.  No heart failure symptoms of PND, orthopnea or edema.  No resting chest pain. No rapid irregular heartbeats or palpitations.  CV Review of Symptoms (Summary)  positive for - Exertional anginal chest pain and shortness of breath negative for - edema, irregular heartbeat, orthopnea, palpitations, paroxysmal nocturnal dyspnea, rapid heart rate, shortness of breath or Syncope/near syncope, TIA/amaurosis fugax  The patient does not have symptoms concerning for COVID-19 infection (fever, chills, cough, or new shortness of breath).  The patient is practicing social distancing. ++ Masking.  He does go out for groceries/shopping, but is careful.   REVIEWED OF SYSTEMS   A comprehensive ROS was performed. Review of Systems  Constitutional: Negative for malaise/fatigue and weight loss. Diaphoresis: Not really fatigue, just concerned now about exercise because of chest pain.  HENT: Negative for congestion and nosebleeds.   Respiratory: Negative for cough, shortness of breath (Only exertional ) and wheezing.   Cardiovascular: Negative for leg swelling.  Gastrointestinal: Negative for blood in stool, diarrhea (No more diarrhea complaints) and melena.  Genitourinary: Negative for hematuria.  Musculoskeletal: Positive for joint pain (Significant hip pain.  Improved some with injection, but still bothersome).  Neurological: Negative for dizziness, focal weakness and headaches.  Psychiatric/Behavioral: Negative for memory loss. The patient is nervous/anxious (He seems a little bit anxious about his symptoms currently.). The patient does not have insomnia.   All other systems reviewed and are negative.  I have reviewed and (if needed) personally updated the  patient's problem list, medications, allergies, past medical and surgical history, social and family history.   PAST MEDICAL HISTORY   Past Medical History:  Diagnosis Date   CAD S/P PTCA & DES PCI - for Progressive Angina 02/26/2019   Cath-PCI 02/26/2019: EF 55-65%. mCx 65% (FFR 0.82 - Med Rx). D2 85% - Wolverine Scoring Balloon PTCA (2.0 mm). Ost rPDA - 90% -Resolute Onyx 2.5 x 15 (2.6 mm).    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 lower back pain, s/p cervical neck surgery"stiffness" remains   Transfusion history    infant "anemia"    PAST SURGICAL HISTORY   Past Surgical History:  Procedure Laterality Date   APPENDECTOMY  ~ [place] N/A 07/30/2018   Procedure: Cervical five-six Cervical six-seven Artificial disc replacement;  Surgeon: Kristeen Miss, MD;  Location: [facility];  Service: Neurosurgery;  Laterality: N/A;   CERVICAL LAMINECTOMY  1997   C 4 and C 5   CHOLECYSTECTOMY N/A 04/15/2014   Procedure: LAPAROSCOPIC CHOLECYSTECTOMY WITH INTRAOPERATIVE CHOLANGIOGRAM;  Surgeon: Armandina Gemma, MD;  Location: [facility];  Service: General;  Laterality: N/A;   COLONOSCOPY     CORONARY BALLOON ANGIOPLASTY N/A 02/26/2019   Procedure: CORONARY BALLOON ANGIOPLASTY;  Surgeon: Leonie Man, MD;  Location: [place] [facility];  Service: Cardiovascular -> scoring balloon PTCA (Wolverine 2.0 mm) of ostial D2 85% reducing to 20-30%.   CORONARY STENT INTERVENTION N/A 02/26/2019   Procedure: CORONARY STENT INTERVENTION;  Surgeon: Leonie Man, MD;  Location: [facility] LAB;; PCI ostial RPDA (initial attempt of PTCA only led to small local tear/dissection covered with stent) Resolute Onyx 2.5 mm x 15 mm (2.6 mm   ESOPHAGOGASTRODUODENOSCOPY N/A 03/13/2014   Procedure:  ESOPHAGOGASTRODUODENOSCOPY (EGD);  Surgeon: Wonda Horner, MD;  Location: [facility];  Service: Endoscopy;  Laterality: N/A;   INTRAVASCULAR PRESSURE WIRE/FFR STUDY N/A 02/26/2019   Procedure: INTRAVASCULAR PRESSURE WIRE/FFR STUDY;  Surgeon: Leonie Man, MD;  Location: [place] [facility];  Service: Cardiovascular;; mCx ~65% - DFR 0.92, FR 0.82 - BORDERLINE--> MED Rx   LEFT HEART CATH AND CORONARY ANGIOGRAPHY N/A 02/26/2019   Procedure: LEFT HEART CATH AND CORONARY ANGIOGRAPHY;  Surgeon: Leonie Man, MD;  Location: [place] [facility];  EF 55-65%. mCx 65% (FFR 0.82 - Med Rx). D2 85% - Wolverine Scoring Balloon PTCA (2.0 mm). Ost rPDA - 90% -Resolute Onyx 2.5 x 15 (2.6 mm).    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   SHOULDER ARTHROSCOPY Bilateral    SHOULDER ARTHROSCOPY W/ ROTATOR CUFF REPAIR Bilateral 2004-2009   left 2004, right 2009   VASECTOMY     WISDOM TOOTH EXTRACTION      Cath-PCI 02/26/2019: EF 55-65%. mCx 65% (FFR 0.82 - Med Rx). D2 85% - Wolverine Scoring Balloon PTCA (2.0 mm). Ost rPDA - 90% -Resolute Onyx 2.5 x 15 (2.6 mm).   (Radial cath  complicated by hematoma)        MEDICATIONS/ALLERGIES   Current Meds  Medication Sig   aspirin EC 81 MG tablet Take 1 tablet (81 mg total) by mouth every evening. (Patient taking differently: Take 81 mg by mouth every other day. )   baclofen (LIORESAL) 10 MG tablet Take 10 mg by mouth daily as needed for muscle spasms.   bimatoprost (LUMIGAN) 0.03 % ophthalmic solution Place 1 drop into both eyes at bedtime.   clopidogrel (PLAVIX) 75 MG tablet Take 1 tablet (75 mg total) by mouth daily.   Evolocumab (REPATHA SURECLICK) [id number] MG/ML SOAJ Inject 140 mg into the skin every 14 (fourteen) days.   hydrochlorothiazide (MICROZIDE) 12.5 MG capsule TAKE 1 CAPSULE BY MOUTH  DAILY   HYDROCORTISONE EX Apply 1 application topically 2 (two) times daily as needed (insect bites/itching).   isosorbide mononitrate  (IMDUR) 30 MG 24 hr tablet Take 30 mg by mouth daily. Take half a tablet daily.    metoprolol succinate (TOPROL-XL) 50 MG 24 hr tablet TAKE 1 TABLET BY MOUTH  DAILY WITH OR IMMEDIATLEY  FOLLOWING A MEAL (Patient taking differently: 25 mg. TAKE 1 TABLET BY MOUTH  DAILY WITH OR IMMEDIATLEY  FOLLOWING A MEAL)   nitroGLYCERIN (NITROSTAT) 0.4 MG SL tablet PLACE 1 TABLET (0.4 MG TOTAL) UNDER THE TONGUE EVERY 5 (FIVE) MINUTES AS NEEDED FOR CHEST PAIN.   potassium chloride SA (K-DUR) 20 MEQ tablet Take 1 tablet (20 mEq total) by mouth daily.   traMADol (ULTRAM) 50 MG tablet 1-2 by mouth BID as needed   valsartan (DIOVAN) 160 MG tablet TAKE 1 TABLET BY MOUTH  DAILY    Allergies  Allergen Reactions   Penicillins Itching    Has patient had a PCN reaction causing immediate rash, facial/tongue/throat swelling, SOB or lightheadedness with hypotension: no Has patient had a PCN reaction causing severe rash involving mucus membranes or skin necrosis: unkn Has patient had a PCN reaction that required hospitalization: no Has patient had a PCN reaction occurring within the last 10 years: no If all of the above answers are "NO", then may proceed with Cephalosporin use.     SOCIAL HISTORY/FAMILY HISTORY   Social History   Tobacco Use   Smoking status: Former Smoker    Packs/day: 1.00    Years: 5.00    Pack years: 5.00    Types: Cigarettes    Quit date: 10/17/1968    Years since quitting: 50.7   Smokeless tobacco: Never Used  Substance Use Topics   Alcohol use: Not Currently    Comment: "quit drinking ~ 10/1968   Drug use: No   Social History   Social History Narrative   Not on file    Family History family history includes Arthritis in his sister; Hypertension in his father and mother; Stroke (age of onset: 81) in his sister.   OBJCTIVE -PE, EKG, labs   Wt Readings from Last 3 Encounters:  06/28/19 187 lb (84.8 kg)  05/30/19 184 lb (83.5 kg)  05/06/19 185 lb 3.2 oz (84 kg)     Physical Exam: BP 124/60    Pulse (!) 57    Ht 6' (1.829 m)    Wt 187 lb (84.8 kg)    SpO2 100%    BMI 25.36 kg/m  Physical Exam  Constitutional: He is oriented to person, place, and time. He appears well-developed and well-nourished. No distress.  Healthy-appearing.  Well-groomed.  HENT:  Head: Normocephalic and atraumatic.  Eyes: Pupils are equal,  round, and reactive to light. Conjunctivae and EOM are normal.  Neck: Normal range of motion. Neck supple. No hepatojugular reflux and no JVD present. Carotid bruit is not present.  Cardiovascular: Normal rate, regular rhythm, normal heart sounds and intact distal pulses.  No extrasystoles are present. PMI is not displaced. Exam reveals no gallop and no friction rub.  No murmur heard. Pulmonary/Chest: Effort normal and breath sounds normal. No respiratory distress. He has no wheezes. He has no rales.  Abdominal: Soft. Bowel sounds are normal. He exhibits no distension. There is no abdominal tenderness. There is no rebound.  No HSM  Musculoskeletal: Normal range of motion.        General: No edema.     Comments: Strength 5 out of 5  Neurological: He is alert and oriented to person, place, and time. No cranial nerve deficit (Grossly intact).  Skin: Skin is warm and dry.  Psychiatric: He has a normal mood and affect. His behavior is normal. Judgment and thought content normal.  Vitals reviewed.    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  We made some few adjustments for him to have his hip injections, but now I think it is time to go back to the Cath Lab in order to reevaluate. Spent a long time reviewing his cath images and discussing my thoughts.  Essentially most likely etiology is recoil of the diagonal branch that was treated with PTCA.  Very unlikely that the stent related problems.  At this point I think we need to return to the Cath Lab and plan to probably attempt PCI with a stent in the diagonal branch.  This would likely be a small caliber stent which will likely be very careful to land such that it does not jeopardize the LAD.  We will plan to protect the LAD. --> Based on the fact that he had pretty significant hematoma complicating his last radial cath, I would like to avoid that and will go femoral access.  Likely consider Mynx closure if possible - this will also allow for 7 Fr catheter to be used if deemed necessary.  We reviewed options and I think this is the best potential plan as medications do not seem to be working as well. In the interim we will have him increase to full dose Imdur and go back to daily aspirin.  He is already on Plavix.  He is now on Toprol 50 mg, and did not tolerate Imdur.  I discussed with him the potential increased risk of diagonal PCI in that we may potentially have to treat the LAD at that section as well.  More than likely, would not address the circumflex lesion as this was found to be not physiologic significant by  FFR.      Relevant Orders   Basic metabolic panel   CBC   Coronary artery disease involving native coronary artery of native heart with unstable angina pectoris (HCC) (Chronic)    At this point he is having progressive unstable class III-IV angina. He is on a pretty good medication regiment with stable dose of Toprol, and increasing his Imdur  dose to 30 mg.  He was having hard time tolerating it at the higher dose initially. He is already on aspirin plus Plavix which, and we will increase his aspirin to every day as opposed every other day.  He is also on ARB for afterload reduction.  Was intolerant of amlodipine for antianginal benefit.  The only medication that we have not used is Ranexa, however recurrent angina within 3 months of PTCA alone would suggest that the most likely etiology of this is recoil in that lesion.  Plan-return to Cath Lab for cardiac catheterization and likely PCI of the diagonal branch.      Relevant Orders   Basic metabolic panel   CBC   Hyperlipidemia with target LDL less than 70 - statin intolerant (Chronic)    Now on reduced dosing interval of Repatha.  LDL as of September look well controlled.      Relevant Orders   Basic metabolic panel   CBC   Essential hypertension (Chronic)    He is on Toprol and losartan.  Blood pressure well controlled.      Relevant Orders   Basic metabolic panel   CBC     Performing MD:  Glenetta Hew, M.D., M.S.  Procedure: LEFT HEART CATH WITH CORONARY ANGIOGRAPHY AND PERCUTANEOUS CORONARY INTERVENTION OF THE FIRST DIAGONAL BRANCH  The procedure with Risks/Benefits/Alternatives and Indications was reviewed with the patient.  All questions were answered.    Risks / Complications include, but not limited to: Death, MI, CVA/TIA, VF/VT (with defibrillation), Bradycardia (need for temporary pacer placement), contrast induced nephropathy, bleeding / bruising / hematoma / pseudoaneurysm, vascular or coronary injury (with possible emergent CT or Vascular Surgery), adverse medication reactions, infection.  Additional risks involving the use of radiation with the possibility of radiation burns and cancer were explained in detail.  The patient voices understanding and agree to proceed.     COVID-19 Education: The signs and symptoms of COVID-19 were discussed with the  patient and how to seek care for testing (follow up with PCP or arrange E-visit).   The importance of social distancing was discussed today.  I spent a total of 30 minutes with the patient and chart review. >  50% of the time was spent in direct patient consultation.  --> We reviewed Images, discussed treatment options.  I explained concerns about PCI.  We discussed the risks and benefits with her for cath consent.  We also then discussed future thoughts based on his need for hip injections.  Although not ideal, theoretically by the time his next injection should be due, with Resolute Onyx DES stents in a non-ACS setting, we should be okay temporarily holding Plavix. Additional time spent with chart review (studies, outside notes, etc): 10 Total Time: 40 min   Current medicines are reviewed at length with the patient today.  (+/- concerns) n/a   Patient Instructions / Medication Changes & Studies & Tests Ordered   Patient Instructions  Medication Instructions:  TAKE ASPIRIN 81 MG EVERYDAY  UNTIL PROCEDURE AND 1 MONTH AFTERWARDS. NO OTHER CHANGES  *If you need a refill on your  cardiac medications before your next appointment, please call your pharmacy*  Lab Work: SEE INSTRUCTION FOR CARDIAC CATH  If you have labs (blood work) drawn today and your tests are completely normal, you will receive your results only by:  [place] (if you have MyChart) OR  A paper copy in the mail If you have any lab test that is abnormal or we need to change your treatment, we will call you to review the results.  Testing/Procedures: SCHEDULE FOR NOV 20 , 2021 7:30 AM - BE AT HOSPITAL AT 5:30 AM - Your physician has requested that you have a cardiac catheterization. Cardiac catheterization is used to diagnose and/or treat various heart conditions. Doctors may recommend this procedure for a number of different reasons. The most common reason is to evaluate chest pain. Chest pain can be a symptom of  coronary artery disease (CAD), and cardiac catheterization can show whether plaque is narrowing or blocking your hearts arteries. This procedure is also used to evaluate the valves, as well as measure the blood flow and oxygen levels in different parts of your heart. For further information please visit [email or web address]. Please follow instruction sheet, as given.   Follow-Up: At [facility], you and your health needs are our priority.  As part of our continuing mission to provide you with exceptional heart care, we have created designated Provider Care Teams.  These Care Teams include your primary Cardiologist (physician) and Advanced Practice Providers (APPs -  Physician Assistants and Nurse Practitioners) who all work together to provide you with the care you need, when you need it.  Your next appointment:    2 WEEKS AFTER CATH  The format for your next appointment:   In Person  Provider:   Glenetta Hew, MD  Other Instructions Precath instructions provided   Studies Ordered:   Orders Placed This Encounter  Procedures   Basic metabolic panel   CBC     Glenetta Hew, M.D., M.S. Interventional Cardiologist   Pager # [phone] Phone # [phone] [address] [place], [place] [address]   Thank you for choosing [facility] at [facility]!!

## 2019-06-30 ENCOUNTER — Encounter: Payer: Self-pay | Admitting: Cardiology

## 2019-06-30 NOTE — Assessment & Plan Note (Signed)
He is on Toprol and losartan.  Blood pressure well controlled.

## 2019-06-30 NOTE — Assessment & Plan Note (Addendum)
After initially not having any symptoms following the PCI of an PTCA procedure back in July, he is now back having significant anginal symptoms.  We made some few adjustments for him to have his hip injections, but now I think it is time to go back to the Cath Lab in order to reevaluate. Spent a long time reviewing his cath images and discussing my thoughts.  Essentially most likely etiology is recoil of the diagonal branch that was treated with PTCA.  Very unlikely that the stent related problems.  At this point I think we need to return to the Cath Lab and plan to probably attempt PCI with a stent in the diagonal branch.  This would likely be a small caliber stent which will likely be very careful to land such that it does not jeopardize the LAD.  We will plan to protect the LAD. --> Based on the fact that he had pretty significant hematoma complicating his last radial cath, I would like to avoid that and will go femoral access.  Likely consider Mynx closure if possible - this will also allow for 7 Fr catheter to be used if deemed necessary.  We reviewed options and I think this is the best potential plan as medications do not seem to be working as well. In the interim we will have him increase to full dose Imdur and go back to daily aspirin.  He is already on Plavix.  He is now on Toprol 50 mg, and did not tolerate Imdur.  I discussed with him the potential increased risk of diagonal PCI in that we may potentially have to treat the LAD at that section as well.  More than likely, would not address the circumflex lesion as this was found to be not physiologic significant by FFR.

## 2019-06-30 NOTE — Assessment & Plan Note (Signed)
Now on reduced dosing interval of Repatha.  LDL as of September look well controlled.

## 2019-06-30 NOTE — Assessment & Plan Note (Signed)
At this point he is having progressive unstable class III-IV angina. He is on a pretty good medication regiment with stable dose of Toprol, and increasing his Imdur dose to 30 mg.  He was having hard time tolerating it at the higher dose initially. He is already on aspirin plus Plavix which, and we will increase his aspirin to every day as opposed every other day.  He is also on ARB for afterload reduction.  Was intolerant of amlodipine for antianginal benefit.  The only medication that we have not used is Ranexa, however recurrent angina within 3 months of PTCA alone would suggest that the most likely etiology of this is recoil in that lesion.  Plan-return to Cath Lab for cardiac catheterization and likely PCI of the diagonal branch.

## 2019-07-01 DIAGNOSIS — I10 Essential (primary) hypertension: Secondary | ICD-10-CM | POA: Diagnosis not present

## 2019-07-01 DIAGNOSIS — E785 Hyperlipidemia, unspecified: Secondary | ICD-10-CM | POA: Diagnosis not present

## 2019-07-01 DIAGNOSIS — I209 Angina pectoris, unspecified: Secondary | ICD-10-CM | POA: Diagnosis not present

## 2019-07-01 DIAGNOSIS — I25110 Atherosclerotic heart disease of native coronary artery with unstable angina pectoris: Secondary | ICD-10-CM | POA: Diagnosis not present

## 2019-07-01 LAB — CBC
Hematocrit: 42.6 % (ref 37.5–51.0)
Hemoglobin: 14.9 g/dL (ref 13.0–17.7)
MCH: 31 pg (ref 26.6–33.0)
MCHC: 35 g/dL (ref 31.5–35.7)
MCV: 89 fL (ref 79–97)
Platelets: 221 10*3/uL (ref 150–450)
RBC: 4.81 x10E6/uL (ref 4.14–5.80)
RDW: 12 % (ref 11.6–15.4)
WBC: 7.3 10*3/uL (ref 3.4–10.8)

## 2019-07-01 LAB — BASIC METABOLIC PANEL
BUN/Creatinine Ratio: 11 (ref 10–24)
BUN: 11 mg/dL (ref 8–27)
CO2: 22 mmol/L (ref 20–29)
Calcium: 9.4 mg/dL (ref 8.6–10.2)
Chloride: 103 mmol/L (ref 96–106)
Creatinine, Ser: 0.97 mg/dL (ref 0.76–1.27)
GFR calc Af Amer: 89 mL/min/{1.73_m2} (ref >59)
GFR calc non Af Amer: 77 mL/min/{1.73_m2} (ref >59)
Glucose: 112 mg/dL — ABNORMAL HIGH (ref 65–99)
Potassium: 4.5 mmol/L (ref 3.5–5.2)
Sodium: 141 mmol/L (ref 134–144)

## 2019-07-02 ENCOUNTER — Other Ambulatory Visit: Payer: Self-pay | Admitting: *Deleted

## 2019-07-02 ENCOUNTER — Other Ambulatory Visit (HOSPITAL_COMMUNITY)
Admission: RE | Admit: 2019-07-02 | Discharge: 2019-07-02 | Disposition: A | Discharge status: Home / Self Care | Payer: Medicare Other | Source: Ambulatory Visit | Attending: Cardiology | Admitting: Cardiology

## 2019-07-02 DIAGNOSIS — Z20828 Contact with and (suspected) exposure to other viral communicable diseases: Secondary | ICD-10-CM | POA: Diagnosis not present

## 2019-07-02 DIAGNOSIS — I209 Angina pectoris, unspecified: Secondary | ICD-10-CM

## 2019-07-02 DIAGNOSIS — Z01812 Encounter for preprocedural laboratory examination: Secondary | ICD-10-CM | POA: Diagnosis not present

## 2019-07-02 DIAGNOSIS — I25110 Atherosclerotic heart disease of native coronary artery with unstable angina pectoris: Secondary | ICD-10-CM

## 2019-07-03 LAB — NOVEL CORONAVIRUS, NAA (HOSP ORDER, SEND-OUT TO REF LAB; TAT 18-24 HRS): SARS-CoV-2, NAA: NOT DETECTED

## 2019-07-04 ENCOUNTER — Telehealth: Payer: Self-pay | Admitting: *Deleted

## 2019-07-04 NOTE — Telephone Encounter (Signed)
Pt contacted pre-coronary stent intervention scheduled at [facility] for: Friday July 05, 2019 7:30 AM Verified arrival time and place: [facility]) at: 5:30 AM   No solid food after midnight prior to cath, clear liquids until 5 AM day of procedure. Contrast allergy: no  Hold: HCTZ/KCl- AM of procedure.  Except hold medications AM meds can be  taken pre-cath with sip of water including: ASA 81 mg Plavix 75 mg  Confirmed patient has responsible adult to drive home post procedure and observe 24 hours after arriving home: yes  Currently, due to Covid-19 pandemic, only one support person will be allowed with patient. Must be the same support person for that patient's entire stay, will be screened and required to wear a mask. They will be asked to wait in the waiting room for the duration of the patient's stay.  Patients are required to wear a mask when they enter the hospital.     COVID-19 Pre-Screening Questions:  . In the past 7 to 10 days have you had a cough,  shortness of breath, headache, congestion, fever (100 or greater) body aches, chills, sore throat, or sudden loss of taste or sense of smell? No-nothing unusual . Have you been around anyone with known Covid 19? no . Have you been around anyone who is awaiting Covid 19 test results in the past 7 to 10 days? no . Have you been around anyone who has been exposed to Covid 19, or has mentioned symptoms of Covid 19 within the past 7 to 10 days? no   I reviewed procedure/mask/visitor instructions, Covid-19 screening questions with patient, he verbalized understanding, thanked me for call.

## 2019-07-05 ENCOUNTER — Ambulatory Visit (HOSPITAL_COMMUNITY)
Admission: RE | Admit: 2019-07-05 | Discharge: 2019-07-05 | Disposition: A | Discharge status: Home / Self Care | Payer: Medicare Other | Attending: Cardiology | Admitting: Cardiology

## 2019-07-05 ENCOUNTER — Encounter (HOSPITAL_COMMUNITY)
Admission: RE | Disposition: A | Discharge status: Home / Self Care | Payer: Self-pay | Source: Home / Self Care | Attending: Cardiology

## 2019-07-05 DIAGNOSIS — Z7902 Long term (current) use of antithrombotics/antiplatelets: Secondary | ICD-10-CM | POA: Diagnosis not present

## 2019-07-05 DIAGNOSIS — I25119 Atherosclerotic heart disease of native coronary artery with unspecified angina pectoris: Secondary | ICD-10-CM | POA: Diagnosis present

## 2019-07-05 DIAGNOSIS — Z955 Presence of coronary angioplasty implant and graft: Secondary | ICD-10-CM | POA: Insufficient documentation

## 2019-07-05 DIAGNOSIS — K219 Gastro-esophageal reflux disease without esophagitis: Secondary | ICD-10-CM | POA: Diagnosis not present

## 2019-07-05 DIAGNOSIS — M1990 Unspecified osteoarthritis, unspecified site: Secondary | ICD-10-CM | POA: Diagnosis not present

## 2019-07-05 DIAGNOSIS — Z8261 Family history of arthritis: Secondary | ICD-10-CM | POA: Diagnosis not present

## 2019-07-05 DIAGNOSIS — I209 Angina pectoris, unspecified: Secondary | ICD-10-CM | POA: Diagnosis present

## 2019-07-05 DIAGNOSIS — Z7982 Long term (current) use of aspirin: Secondary | ICD-10-CM | POA: Insufficient documentation

## 2019-07-05 DIAGNOSIS — Z880 Allergy status to penicillin: Secondary | ICD-10-CM | POA: Insufficient documentation

## 2019-07-05 DIAGNOSIS — Z87891 Personal history of nicotine dependence: Secondary | ICD-10-CM | POA: Insufficient documentation

## 2019-07-05 DIAGNOSIS — I25110 Atherosclerotic heart disease of native coronary artery with unstable angina pectoris: Secondary | ICD-10-CM

## 2019-07-05 DIAGNOSIS — Z8249 Family history of ischemic heart disease and other diseases of the circulatory system: Secondary | ICD-10-CM | POA: Diagnosis not present

## 2019-07-05 DIAGNOSIS — I2089 Other forms of angina pectoris: Secondary | ICD-10-CM | POA: Diagnosis present

## 2019-07-05 DIAGNOSIS — E785 Hyperlipidemia, unspecified: Secondary | ICD-10-CM | POA: Diagnosis present

## 2019-07-05 DIAGNOSIS — I10 Essential (primary) hypertension: Secondary | ICD-10-CM | POA: Diagnosis not present

## 2019-07-05 HISTORY — PX: CORONARY STENT INTERVENTION: CATH118234

## 2019-07-05 HISTORY — PX: LEFT HEART CATH AND CORONARY ANGIOGRAPHY: CATH118249

## 2019-07-05 LAB — POCT ACTIVATED CLOTTING TIME
Activated Clotting Time: 241 seconds
Activated Clotting Time: 318 seconds

## 2019-07-05 SURGERY — CORONARY STENT INTERVENTION
Anesthesia: LOCAL

## 2019-07-05 MED ORDER — LABETALOL HCL 5 MG/ML IV SOLN: 10.0000 mg | INTRAVENOUS | Status: AC | PRN

## 2019-07-05 MED ORDER — POTASSIUM CHLORIDE CRYS ER 20 MEQ PO TBCR
20.0000 meq | EXTENDED_RELEASE_TABLET | Freq: Every day | ORAL | Status: DC
  Filled 2019-07-05: qty 1

## 2019-07-05 MED ORDER — MIDAZOLAM HCL 2 MG/2ML IJ SOLN
INTRAMUSCULAR | Status: AC
  Filled 2019-07-05: qty 2

## 2019-07-05 MED ORDER — FENTANYL CITRATE (PF) 100 MCG/2ML IJ SOLN
INTRAMUSCULAR | Status: DC | PRN
  Administered 2019-07-05 (×2): 25 ug via INTRAVENOUS
  Administered 2019-07-05: 50 ug via INTRAVENOUS

## 2019-07-05 MED ORDER — TRAMADOL HCL 50 MG PO TABS: 50.0000 mg | ORAL_TABLET | Freq: Four times a day (QID) | ORAL | Status: DC | PRN

## 2019-07-05 MED ORDER — OXYCODONE-ACETAMINOPHEN 5-325 MG PO TABS: 1.0000 | ORAL_TABLET | Freq: Four times a day (QID) | ORAL | Status: DC

## 2019-07-05 MED ORDER — BACLOFEN 10 MG PO TABS: 10.0000 mg | ORAL_TABLET | Freq: Every day | ORAL | Status: DC | PRN

## 2019-07-05 MED ORDER — SODIUM CHLORIDE 0.9 % WEIGHT BASED INFUSION: 1.0000 mL/kg/h | INTRAVENOUS | Status: DC

## 2019-07-05 MED ORDER — CLOPIDOGREL BISULFATE 75 MG PO TABS: 75.0000 mg | ORAL_TABLET | Freq: Every day | ORAL | Status: DC

## 2019-07-05 MED ORDER — IRBESARTAN 150 MG PO TABS: 150.0000 mg | ORAL_TABLET | Freq: Every day | ORAL | Status: DC

## 2019-07-05 MED ORDER — METOPROLOL SUCCINATE ER 25 MG PO TB24: 25.0000 mg | ORAL_TABLET | Freq: Every day | ORAL | Status: DC

## 2019-07-05 MED ORDER — NITROGLYCERIN 1 MG/10 ML FOR IR/CATH LAB
INTRA_ARTERIAL | Status: DC | PRN
  Administered 2019-07-05 (×2): 200 ug via INTRACORONARY

## 2019-07-05 MED ORDER — HEPARIN (PORCINE) IN NACL 1000-0.9 UT/500ML-% IV SOLN
INTRAVENOUS | Status: AC
  Filled 2019-07-05: qty 1000

## 2019-07-05 MED ORDER — EVOLOCUMAB 140 MG/ML ~~LOC~~ SOAJ: 140.0000 mg | SUBCUTANEOUS | Status: DC

## 2019-07-05 MED ORDER — HEPARIN SODIUM (PORCINE) 1000 UNIT/ML IJ SOLN
INTRAMUSCULAR | Status: AC
  Filled 2019-07-05: qty 1

## 2019-07-05 MED ORDER — ONDANSETRON HCL 4 MG/2ML IJ SOLN: 4.0000 mg | Freq: Four times a day (QID) | INTRAMUSCULAR | Status: DC | PRN

## 2019-07-05 MED ORDER — SODIUM CHLORIDE 0.9% FLUSH: 3.0000 mL | Freq: Two times a day (BID) | INTRAVENOUS | Status: DC

## 2019-07-05 MED ORDER — ISOSORBIDE MONONITRATE ER 60 MG PO TB24
60.0000 mg | ORAL_TABLET | Freq: Every day | ORAL | Status: DC
  Filled 2019-07-05: qty 1

## 2019-07-05 MED ORDER — HEPARIN (PORCINE) IN NACL 1000-0.9 UT/500ML-% IV SOLN
INTRAVENOUS | Status: DC | PRN
  Administered 2019-07-05: 500 mL

## 2019-07-05 MED ORDER — NITROGLYCERIN 1 MG/10 ML FOR IR/CATH LAB
INTRA_ARTERIAL | Status: AC
  Filled 2019-07-05: qty 10

## 2019-07-05 MED ORDER — MIDAZOLAM HCL 2 MG/2ML IJ SOLN
INTRAMUSCULAR | Status: DC | PRN
  Administered 2019-07-05 (×2): 1 mg via INTRAVENOUS

## 2019-07-05 MED ORDER — SODIUM CHLORIDE 0.9% FLUSH: 3.0000 mL | INTRAVENOUS | Status: DC | PRN

## 2019-07-05 MED ORDER — IOHEXOL 350 MG/ML SOLN
INTRAVENOUS | Status: DC | PRN
  Administered 2019-07-05: 105 mL via INTRA_ARTERIAL

## 2019-07-05 MED ORDER — ACETAMINOPHEN 325 MG PO TABS: 650.0000 mg | ORAL_TABLET | ORAL | Status: DC | PRN

## 2019-07-05 MED ORDER — SODIUM CHLORIDE 0.9 % IV SOLN: INTRAVENOUS | Status: AC

## 2019-07-05 MED ORDER — NITROGLYCERIN 0.4 MG SL SUBL: 0.4000 mg | SUBLINGUAL_TABLET | SUBLINGUAL | Status: DC | PRN

## 2019-07-05 MED ORDER — HYDRALAZINE HCL 20 MG/ML IJ SOLN: 10.0000 mg | INTRAMUSCULAR | Status: AC | PRN

## 2019-07-05 MED ORDER — FENTANYL CITRATE (PF) 100 MCG/2ML IJ SOLN
INTRAMUSCULAR | Status: AC
  Filled 2019-07-05: qty 2

## 2019-07-05 MED ORDER — ASPIRIN EC 81 MG PO TBEC: 81.0000 mg | DELAYED_RELEASE_TABLET | Freq: Every day | ORAL | Status: DC

## 2019-07-05 MED ORDER — SODIUM CHLORIDE 0.9 % IV SOLN: 250.0000 mL | INTRAVENOUS | Status: DC | PRN

## 2019-07-05 MED ORDER — SODIUM CHLORIDE 0.9 % WEIGHT BASED INFUSION
3.0000 mL/kg/h | INTRAVENOUS | Status: AC
  Administered 2019-07-05: 07:00:00 3 mL/kg/h via INTRAVENOUS

## 2019-07-05 MED ORDER — LIDOCAINE HCL (PF) 1 % IJ SOLN
INTRAMUSCULAR | Status: AC
  Filled 2019-07-05: qty 30

## 2019-07-05 MED ORDER — LIDOCAINE HCL (PF) 1 % IJ SOLN
INTRAMUSCULAR | Status: DC | PRN
  Administered 2019-07-05: 20 mL

## 2019-07-05 MED ORDER — HEPARIN SODIUM (PORCINE) 1000 UNIT/ML IJ SOLN
INTRAMUSCULAR | Status: DC | PRN
  Administered 2019-07-05: 7000 [IU] via INTRAVENOUS
  Administered 2019-07-05: 3000 [IU] via INTRAVENOUS

## 2019-07-05 SURGICAL SUPPLY — 19 items
BALLN SAPPHIRE 2.0X12 (BALLOONS) ×2
BALLN SAPPHIRE 2.5X15 (BALLOONS) ×2
BALLN SAPPHIRE ~~LOC~~ 2.5X10 (BALLOONS) ×1 IMPLANT
BALLOON SAPPHIRE 2.0X12 (BALLOONS) IMPLANT
BALLOON SAPPHIRE 2.5X15 (BALLOONS) IMPLANT
CATH INFINITI JR4 5F (CATHETERS) ×1 IMPLANT
CATH VISTA GUIDE 6FR XBLAD3.5 (CATHETERS) ×1 IMPLANT
CLOSURE MYNX CONTROL 6F/7F (Vascular Products) ×1 IMPLANT
KIT ENCORE 26 ADVANTAGE (KITS) ×1 IMPLANT
KIT HEART LEFT (KITS) ×2 IMPLANT
PACK CARDIAC CATHETERIZATION (CUSTOM PROCEDURE TRAY) ×2 IMPLANT
SHEATH PINNACLE 6F 10CM (SHEATH) ×1 IMPLANT
SHEATH PROBE COVER 6X72 (BAG) ×1 IMPLANT
STENT RESOLUTE ONYX 2.25X15 (Permanent Stent) ×1 IMPLANT
TRANSDUCER W/STOPCOCK (MISCELLANEOUS) ×2 IMPLANT
TUBING CIL FLEX 10 FLL-RA (TUBING) ×2 IMPLANT
WIRE ASAHI PROWATER 180CM (WIRE) ×1 IMPLANT
WIRE EMERALD 3MM-J .035X150CM (WIRE) ×1 IMPLANT
WIRE RUNTHROUGH .014X180CM (WIRE) ×1 IMPLANT

## 2019-07-05 NOTE — Interval H&P Note (Signed)
History and Physical Interval Note:  07/05/2019 7:25 AM  Luis Guzman  has presented today for surgery, with the diagnosis of class 3 Angina, CAD, progressive angina.  The various methods of treatment have been discussed with the patient and family. After consideration of risks, benefits and other options for treatment, the patient has consented to  Procedure(s): CORONARY STENT INTERVENTION (N/A) as a surgical intervention.  The patient's history has been reviewed, patient examined, no change in status, stable for surgery.  I have reviewed the patient's chart and labs.  Questions were answered to the patient's satisfaction.    Cath Lab Visit (complete for each Cath Lab visit)  Clinical Evaluation Leading to the Procedure:   ACS: No.  Non-ACS:    Anginal Classification: CCS III  Anti-ischemic medical therapy: Maximal Therapy (2 or more classes of medications)  Non-Invasive Test Results: No non-invasive testing performed  Prior CABG: No previous CABG   Glenetta Hew

## 2019-07-05 NOTE — Discharge Summary (Signed)
Discharge Summary/SAME DAY PCI    Patient ID: Luis Guzman,  MRN: [id number], DOB/AGE: 1946/03/03 73 y.o.  Admit date: 07/05/2019 Discharge date: 07/05/2019  Primary Care Provider: [facility] Primary Cardiologist: Dr. Ellyn Hack   Discharge Diagnoses    Principal Problem:   Angina, class III ([place]) Active Problems:   Hyperlipidemia with target LDL less than 70 - statin intolerant   Essential hypertension   Coronary artery disease involving native coronary artery of native heart with unstable angina pectoris (HCC)   Allergies Allergies  Allergen Reactions  . Penicillins Itching    Has patient had a PCN reaction causing immediate rash, facial/tongue/throat swelling, SOB or lightheadedness with hypotension: no Has patient had a PCN reaction causing severe rash involving mucus membranes or skin necrosis: unkn Has patient had a PCN reaction that required hospitalization: no Has patient had a PCN reaction occurring within the last 10 years: no If all of the above answers are "NO", then may proceed with Cephalosporin use.     Diagnostic Studies/Procedures    Cath: 07/05/19   1st Diag lesion is 95% stenosed at location previously treated scoring balloon angioplasty.  A drug-eluting stent was successfully placed-intentionally avoiding the ostium-using a STENT RESOLUTE ONYX 2.25X15. Postdilated to 2.6 mm in the mid stent  Post intervention, there is a 0% residual stenosis.  -------------------------------------  Previously placed RPDA drug eluting stent is widely patent.  Mid Cx lesion is 65% stenosed. Stable.  Prox RCA lesion is 40% stenosed. Mid RCA lesion is 45% stenosed.  Normal LVEDP   SUMMARY  Three-vessel CAD with widely patent PDA stent, stable mid LCx lesion with severe restenosis of the ostial-proximal 1st Diag as the culprit lesion.  Successful DES PCI of 1st Diag carefully missed in the ostium to avoid impinging on the LAD--resolute Onyx DES 2.25 mm  a 50 mm (2.6 mm)  Normal LVEDP  RECOMMENDATIONS  We will plan for same-day discharge today after bedrest post Mynx closure device.  For now we will continue current medication regimen, will consider discontinuing Imdur and outpatient follow-up.   Glenetta Hew, M.D., M.S. Interventional Cardiologist   Diagnostic Dominance: Right  Intervention    _____________   History of Present Illness     Luis Guzman is a 73 y.o. male with a history of CAD-PCI (most recently in July PCI to the PDA with PTCA only to ostial diagonal branch) who presented to the office for follow up.   Luis Guzman was last seen on May 30, 2019 -> he noted recurrence of his exertional chest pain.  Started having worrisome exertional dyspnea and chest pain symptoms with a couple pretty significant episodes.  He started back on low-dose Imdur.  Was taking nitroglycerin and aspirin.  No real resting episodes and no prolonged episodes. He was also having a difficult time walking because of hip pain and was hoping to have hip injections.  Usually a series of these injections tests allow him to get back into walking.  We did agree for him to hold his Plavix to get the injection performed and then reassess after.  This way he can get some relief in order for [place] to determine exertional symptoms.   He presented to the office on 11/13 indicating that while the hip injection helped some, he still had not gotten back walking like he would like to.  He reported pretty significant exertional angina to the point where he is able to walk around the room okay,  but has more chest pain if he carries anything, or if he goes up a flight of steps.  Any prolonged walking seems to be troublesome. Usually these episodes are self limiting if he stops and rests, but he has been taking nitroglycerin.  Symptoms seem to be getting worse.  No heart failure symptoms of PND, orthopnea or edema.  No resting chest pain. No rapid irregular  heartbeats or palpitations.  Given symptoms, he was set up for outpatient cardiac cath.   Hospital Course     Underwent cardiac cath noted above with PCI/DES x 1 of 1st Diag. Patent prior stent to the PDA and stan;e mLcx lesion. Plan for DAPT with ASA/Plavix for at least 6 months. Seen by cardiac rehab while in short stay. No complications post cath. Right femoral site stable, closed with mynx device. Instructions/precautions regarding cath site care given prior to discharge.    Luis Guzman was seen by Dr. Ellyn Hack and determined stable for discharge home. Follow up in the office has been arranged. Medications are listed below.   _____________  Discharge Vitals Blood pressure (!) 145/66, pulse 71, temperature 99 F (37.2 C), resp. rate 18, height 6' (1.829 m), weight 81.6 kg, SpO2 100 %.  Filed Weights   07/05/19 0639  Weight: 81.6 kg    Labs & Radiologic Studies    CBC No results for input(s): WBC, NEUTROABS, HGB, HCT, MCV, PLT in the last 72 hours. Basic Metabolic Panel No results for input(s): NA, K, CL, CO2, GLUCOSE, BUN, CREATININE, CALCIUM, MG, PHOS in the last 72 hours. Liver Function Tests No results for input(s): AST, ALT, ALKPHOS, BILITOT, PROT, ALBUMIN in the last 72 hours. No results for input(s): LIPASE, AMYLASE in the last 72 hours. Cardiac Enzymes No results for input(s): CKTOTAL, CKMB, CKMBINDEX, TROPONINI in the last 72 hours. BNP Invalid input(s): POCBNP D-Dimer No results for input(s): DDIMER in the last 72 hours. Hemoglobin A1C No results for input(s): HGBA1C in the last 72 hours. Fasting Lipid Panel No results for input(s): CHOL, HDL, LDLCALC, TRIG, CHOLHDL, LDLDIRECT in the last 72 hours. Thyroid Function Tests No results for input(s): TSH, T4TOTAL, T3FREE, THYROIDAB in the last 72 hours.  Invalid input(s): FREET3 _____________  Fay Records Guided Needle Plc Aspiration / Injecttion/loc  Result Date: 06/06/2019 CLINICAL DATA:  Chronic left hip pain.  FLUOROSCOPY TIME:  Radiation Exposure Index (as provided by the fluoroscopic device): 0.4 mGy Fluoroscopy Time:  4 seconds Number of Acquired Images:  0 PROCEDURE: The risks and benefits of the procedure were discussed with the patient, and written informed consent was obtained. The patient stated no history of allergy to contrast media. A formal timeout procedure was performed with the patient according to departmental protocol. The patient was placed supine on the fluoroscopy table and the left hip joint was identified under fluoroscopy. The skin overlying the left hip joint was subsequently cleaned with Betadine and a sterile drape was placed over the area of interest. 2 ml 1% Lidocaine was used to anesthetize the skin around the needle insertion site. A 22 gauge spinal needle was inserted into the left hip joint under fluoroscopy. Diagnostic injection of 1 ml Isovue-M 200 iodinated contrast demonstrates intra-articular spread without intravascular component. 120mg  Depo-Medrol and 5 ml Sensorcaine 0.5% were then administered into the left hip joint. The needle was removed and hemostasis was achieved. There were no immediate complications. IMPRESSION: Technically successful fluoroscopically guided left hip injection. Electronically Signed   By: Orville Govern.D.  On: 06/06/2019 08:11   Disposition   Pt is being discharged home today in good condition.  Follow-up Plans & Appointments    Follow-up Information    Leonie Man, MD Follow up on 07/18/2019.   Specialty: Cardiology Why: at 8am for your follow up appt. Contact information: [address] [place] [address] [phone]          Discharge Instructions    Amb Referral to Cardiac Rehabilitation   Complete by: As directed    Can do after hip is recovered   Diagnosis:  Coronary Stents PTCA     After initial evaluation and assessments completed: Virtual Based Care may be provided alone or in conjunction with  Phase 2 Cardiac Rehab based on patient barriers.: Yes       Discharge Medications     Medication List    TAKE these medications   aspirin EC 81 MG tablet Take 1 tablet (81 mg total) by mouth every evening. What changed: when to take this   baclofen 10 MG tablet Commonly known as: LIORESAL Take 10 mg by mouth daily as needed for muscle spasms.   bimatoprost 0.03 % ophthalmic solution Commonly known as: LUMIGAN Place 1 drop into both eyes at bedtime.   clopidogrel 75 MG tablet Commonly known as: PLAVIX Take 1 tablet (75 mg total) by mouth daily.   hydrochlorothiazide 12.5 MG capsule Commonly known as: MICROZIDE TAKE 1 CAPSULE BY MOUTH  DAILY   HYDROCORTISONE EX Apply 1 application topically 2 (two) times daily as needed (insect bites/itching).   isosorbide mononitrate 30 MG 24 hr tablet Commonly known as: IMDUR Take 60 mg by mouth daily.   metoprolol succinate 50 MG 24 hr tablet Commonly known as: TOPROL-XL TAKE 1 TABLET BY MOUTH  DAILY WITH OR IMMEDIATLEY  FOLLOWING A MEAL What changed:   how much to take  how to take this  when to take this  additional instructions   nitroGLYCERIN 0.4 MG SL tablet Commonly known as: NITROSTAT PLACE 1 TABLET (0.4 MG TOTAL) UNDER THE TONGUE EVERY 5 (FIVE) MINUTES AS NEEDED FOR CHEST PAIN.   potassium chloride SA 20 MEQ tablet Commonly known as: KLOR-CON Take 1 tablet (20 mEq total) by mouth daily.   Repatha SureClick [id number] MG/ML So[facility] Generic drug: Evolocumab Inject 140 mg into the skin every 14 (fourteen) days.   traMADol 50 MG tablet Commonly known as: ULTRAM 1-2 by mouth BID as needed What changed:   how much to take  how to take this  when to take this  reasons to take this  additional instructions   TURMERIC PO Take 3,000 mg by mouth daily.   valsartan 160 MG tablet Commonly known as: DIOVAN TAKE 1 TABLET BY MOUTH  DAILY        No                               Did the patient have a percutaneous  coronary intervention (stent / angioplasty)?:  Yes.     Cath/PCI Registry Performance & Quality Measures: 1. Aspirin prescribed? - Yes 2. ADP Receptor Inhibitor (Plavix/Clopidogrel, Brilinta/Ticagrelor or Effient/Prasugrel) prescribed (includes medically managed patients)? - Yes 3. High Intensity Statin (Lipitor 40-80mg  or Crestor 20-40mg ) prescribed? - No - on Repatha 4. For EF <40%, was ACEI/ARB prescribed? - Not Applicable (EF >/= [id number]) 5. For EF <40%, Aldosterone Antagonist (Spironolactone or Eplerenone) prescribed? - Not Applicable (EF >/= [id number])  6. Cardiac Rehab Phase II ordered (Included Medically managed Patients)? - Yes      Outstanding Labs/Studies   N/a   Duration of Discharge Encounter   Greater than 30 minutes including physician time.  Signed, Reino Bellis NP-C 07/05/2019, 2:24 PM

## 2019-07-05 NOTE — Progress Notes (Signed)
Discussed stent, Plavix, restrictions, diet (reviewed), exercise as tolerated (hip has been a problem), NTG and CRPII. Good reception. He is going to try to walk short distances a few times a day but probably will not be able to do CRPII until his hip is better (planning injections eventually). Will refer to [place]. Understands the importance of Plavix. [place]:[id number] [place], ACSM 11:06 AM 07/05/2019

## 2019-07-05 NOTE — Progress Notes (Signed)
Additional manual pressure held on right groin for 15 mins. Groin level 0

## 2019-07-05 NOTE — Discharge Instructions (Signed)
Coronary Angiogram With Stent, Care After °This sheet gives you information about how to care for yourself after your procedure. Your health care provider may also give you more specific instructions. If you have problems or questions, contact your health care provider. °What can I expect after the procedure? °After your procedure, it is common to have: °· Bruising in the area where a small, thin tube (catheter) was inserted. This usually fades within 1-2 weeks. °· Blood collecting in the tissue (hematoma) that may be painful to the touch. It should usually decrease in size and tenderness within 1-2 weeks. °Follow these instructions at home: °Insertion area care °· Do not take baths, swim, or use a hot tub until your health care provider approves. °· You may shower 24-48 hours after the procedure or as directed by your health care provider. °· Follow instructions from your health care provider about how to take care of your incision. Make sure you: °? Wash your hands with soap and water before you change your bandage (dressing). If soap and water are not available, use hand sanitizer. °? Change your dressing as told by your health care provider. °? Leave stitches (sutures), skin glue, or adhesive strips in place. These skin closures may need to stay in place for 2 weeks or longer. If adhesive strip edges start to loosen and curl up, you may trim the loose edges. Do not remove adhesive strips completely unless your health care provider tells you to do that. °· Remove the bandage (dressing) and gently wash the catheter insertion site with plain soap and water. °· Pat the area dry with a clean towel. Do not rub the area, because that may cause bleeding. °· Do not apply powder or lotion to the incision area. °· Check your incision area every day for signs of infection. Check for: °? More redness, swelling, or pain. °? More fluid or blood. °? Warmth. °? Pus or a bad smell. °Activity °· Do not drive for 24 hours if you  were given a medicine to help you relax (sedative). °· Do not lift anything that is heavier than 10 lb (4.5 kg) for 5 days after your procedure or as directed by your health care provider. °· Ask your health care provider when it is okay for you: °? To return to work or school. °? To resume usual physical activities or sports. °? To resume sexual activity. °Eating and drinking ° °· Eat a heart-healthy diet. This should include plenty of fresh fruits and vegetables. °· Avoid the following types of food: °? Food that is high in salt. °? Canned or highly processed food. °? Food that is high in saturated fat or sugar. °? Fried food. °· Limit alcohol intake to no more than 1 drink a day for non-pregnant women and 2 drinks a day for men. One drink equals 12 oz of beer, 5 oz of wine, or 1½ oz of hard liquor. °Lifestyle ° °· Do not use any products that contain nicotine or tobacco, such as cigarettes and e-cigarettes. If you need help quitting, ask your health care provider. °· Take steps to manage and control your weight. °· Get regular exercise. °· Manage your blood pressure. °· Manage other health problems, such as diabetes. °General instructions °· Take over-the-counter and prescription medicines only as told by your health care provider. Blood thinners may be prescribed after your procedure to improve blood flow through the stent. °· If you need an MRI after your heart stent has been placed,   be sure to tell the health care provider who orders the MRI that you have a heart stent.  Keep all follow-up visits as directed by your health care provider. This is important. Contact a health care provider if:  You have a fever.  You have chills.  You have increased bleeding from the catheter insertion area. Hold pressure on the area. Get help right away if:  You develop chest pain or shortness of breath.  You feel faint or you pass out.  You have unusual pain at the catheter insertion area.  You have redness,  warmth, or swelling at the catheter insertion area.  You have drainage (other than a small amount of blood on the dressing) from the catheter insertion area.  The catheter insertion area is bleeding, and the bleeding does not stop after 30 minutes of holding steady pressure on the area.  You develop bleeding from any other place, such as from your rectum. There may be bright red blood in your urine or stool, or it may appear as black, tarry stool. This information is not intended to replace advice given to you by your health care provider. Make sure you discuss any questions you have with your health care provider. Document Released: 02/18/2005 Document Revised: 07/14/2017 Document Reviewed: 04/28/2016 Elsevier Patient Education  [place].   Moderate Conscious Sedation, Adult, Care After These instructions provide you with information about caring for yourself after your procedure. Your health care provider may also give you more specific instructions. Your treatment has been planned according to current medical practices, but problems sometimes occur. Call your health care provider if you have any problems or questions after your procedure. What can I expect after the procedure? After your procedure, it is common:  To feel sleepy for several hours.  To feel clumsy and have poor balance for several hours.  To have poor judgment for several hours.  To vomit if you eat too soon. Follow these instructions at home: For at least 24 hours after the procedure:   Do not: ? Participate in activities where you could fall or become injured. ? Drive. ? Use heavy machinery. ? Drink alcohol. ? Take sleeping pills or medicines that cause drowsiness. ? Make important decisions or sign legal documents. ? Take care of children on your own.  Rest. Eating and drinking  Follow the diet recommended by your health care provider.  If you vomit: ? Drink water, juice, or soup when you can  drink without vomiting. ? Make sure you have little or no nausea before eating solid foods. General instructions  Have a responsible adult stay with you until you are awake and alert.  Take over-the-counter and prescription medicines only as told by your health care provider.  If you smoke, do not smoke without supervision.  Keep all follow-up visits as told by your health care provider. This is important. Contact a health care provider if:  You keep feeling nauseous or you keep vomiting.  You feel light-headed.  You develop a rash.  You have a fever. Get help right away if:  You have trouble breathing. This information is not intended to replace advice given to you by your health care provider. Make sure you discuss any questions you have with your health care provider. Document Released: 05/22/2013 Document Revised: 07/14/2017 Document Reviewed: 11/21/2015 Elsevier Patient Education  2020 [employer].

## 2019-07-08 ENCOUNTER — Telehealth (HOSPITAL_COMMUNITY): Payer: Self-pay

## 2019-07-08 ENCOUNTER — Encounter (HOSPITAL_COMMUNITY): Payer: Self-pay | Admitting: Cardiology

## 2019-07-08 NOTE — Telephone Encounter (Signed)
Per ph.1 pt not able to participate in CR, pt planning injections eventually.  Will close referral for now and recheck in a few months.

## 2019-07-18 ENCOUNTER — Ambulatory Visit: Payer: Medicare Other | Admitting: Cardiology

## 2019-07-18 ENCOUNTER — Ambulatory Visit (INDEPENDENT_AMBULATORY_CARE_PROVIDER_SITE_OTHER): Payer: Medicare Other | Admitting: Cardiology

## 2019-07-18 ENCOUNTER — Encounter: Payer: Self-pay | Admitting: Cardiology

## 2019-07-18 ENCOUNTER — Other Ambulatory Visit: Payer: Self-pay

## 2019-07-18 VITALS — BP 132/74 | HR 68 | Temp 96.6°F | Ht 72.0 in | Wt 194.0 lb

## 2019-07-18 DIAGNOSIS — I10 Essential (primary) hypertension: Secondary | ICD-10-CM

## 2019-07-18 DIAGNOSIS — R5383 Other fatigue: Secondary | ICD-10-CM | POA: Diagnosis not present

## 2019-07-18 DIAGNOSIS — I25110 Atherosclerotic heart disease of native coronary artery with unstable angina pectoris: Secondary | ICD-10-CM

## 2019-07-18 DIAGNOSIS — E785 Hyperlipidemia, unspecified: Secondary | ICD-10-CM | POA: Diagnosis not present

## 2019-07-18 DIAGNOSIS — I209 Angina pectoris, unspecified: Secondary | ICD-10-CM

## 2019-07-18 MED ORDER — VALSARTAN 320 MG PO TABS: 320.0000 mg | ORAL_TABLET | Freq: Every day | ORAL | 3 refills | Status: DC

## 2019-07-18 MED ORDER — ISOSORBIDE MONONITRATE ER 30 MG PO TB24: 30.0000 mg | ORAL_TABLET | Freq: Every day | ORAL | 3 refills | Status: DC

## 2019-07-18 MED ORDER — METOPROLOL SUCCINATE ER 25 MG PO TB24: 12.5000 mg | ORAL_TABLET | Freq: Every day | ORAL | 3 refills | Status: DC

## 2019-07-18 MED ORDER — HYDROCHLOROTHIAZIDE 12.5 MG PO CAPS: 12.5000 mg | ORAL_CAPSULE | ORAL | 3 refills | Status: DC | PRN

## 2019-07-18 MED ORDER — POTASSIUM CHLORIDE CRYS ER 20 MEQ PO TBCR: 20.0000 meq | EXTENDED_RELEASE_TABLET | ORAL | 3 refills | Status: DC | PRN

## 2019-07-18 NOTE — Assessment & Plan Note (Signed)
Best I can tell, this is probably related to some deconditioning because of his hip pain but also could potentially be related to beta-blocker.  Plan: Reduce beta-blocker dose to Toprol 12.5 mg daily.

## 2019-07-18 NOTE — Assessment & Plan Note (Signed)
Now he has 2 relatively small caliber DES stents in branch vessels. Did well after holding Plavix for hip injection in October.  Would like to wait closer to 2-3 months post PCI to hold again for the procedure, however if urgent-would be acceptable in early January.  Otherwise would continue aspirin plus Plavix for total of 6 months and then stop aspirin.  Continue Plavix for maintenance for up to 2 years --okay to be interrupted after 6 months.[name]

## 2019-07-18 NOTE — Progress Notes (Signed)
Primary Care Provider: Eulas Post, MD Cardiologist: Glenetta Hew, MD  Clinic Note: Chief Complaint  Patient presents with  . Hospitalization Follow-up    Guzman cath PCI  . Coronary Artery Disease    HPI:    Luis Guzman is a 73 y.o. male with a history of CAD-PCI (in July PCI to the PDA with PTCA only to ostial diagonal branch ->) who presents today for close follow-up to reassess angina level.  Luis Guzman was last seen on June 18, 2019 -> follow-up after medication adjustment and hip injection.  He noted worsening anginal pain and was scheduled for staged PCI of the diagonal branch.  Recent Hospitalizations: 07/05/2019  Reviewed  CV studies:    The following studies were reviewed today: (if available, images/films reviewed: From Epic Chart or Care Everywhere) . Cath- PCI 07/05/2019: ostial D1 95% (restenosis of PTCA site) -> DES PCI RESOLUTE ONYX 2.25 mm x 50 mm (2.6 mm) positioned to avoid the true ostium.  PDA stent widely patent.  Mid CX stable 65% lesion.  Proximal RCA 40%.  Mid RCA 45%.  Normal EDP.    Interval History:   Luis Guzman returns here today stating following that he has no more chest pain, just that he has pretty significant fatigue.  Not a lot of energy.  Feels like going to bed by late afternoon.  He is now mostly limited by hip pain.  Pretty much doing any thing more than just walking around the house causes pain.  Getting out of the car, and out of bed, up and down from chairs is extremely painful.  Relatively unsatisfied with the response to the recent injection.  CV Review of Symptoms (Summary)  no chest pain or dyspnea on exertion positive for - Mostly just fatigue negative for - edema, irregular heartbeat, orthopnea, palpitations, paroxysmal nocturnal dyspnea, rapid heart rate, shortness of breath or Syncope/near syncope, TIA/amaurosis fugax; claudication  The patient does not have symptoms concerning for COVID-19 infection (fever, chills,  cough, or new shortness of breath).  The patient is practicing social distancing.  He is doing well with masking.  Is careful when he goes out for groceries/shopping  REVIEWED OF SYSTEMS   A comprehensive ROS was performed. Review of Systems  Constitutional: Positive for malaise/fatigue (Feels tired in the end of the day.). Negative for weight loss. Diaphoresis: Not really fatigue, just concerned now about exercise because of chest pain.  HENT: Negative for congestion and nosebleeds.   Respiratory: Negative for cough, shortness of breath (Only exertional ) and wheezing.   Cardiovascular: Negative for leg swelling.  Gastrointestinal: Negative for blood in stool, diarrhea (No more diarrhea complaints) and melena.  Genitourinary: Negative for hematuria.  Musculoskeletal: Positive for joint pain (Left hip is extremely painful as noted above.  Now the right hip is starting to hurt some, because of favoring.).  Neurological: Negative for dizziness (Only if he stands up quickly) and focal weakness.  Endo/Heme/Allergies: Bruises/bleeds easily.  Psychiatric/Behavioral: Negative for depression and memory loss. The patient is not nervous/anxious (.) and does not have insomnia.   All other systems reviewed and are negative.  I have reviewed and (if needed) personally updated the patient's problem list, medications, allergies, past medical and surgical history, social and family history.   PAST MEDICAL HISTORY   Past Medical History:  Diagnosis Date  . CAD S/P PTCA & DES PCI - for Progressive Angina 02/26/2019   Cath-PCI 02/26/2019: EF 55-65%. mCx 65% (FFR 0.82 -  Med Rx). D2 85% - Wolverine Scoring Balloon PTCA (2.0 mm). Ost rPDA - 90% -Resolute Onyx 2.5 x 15 (2.6 mm). --> 06/2019: staged DES PCI ost D1 (RESOLUTE ONYX 2.25 mm x 50 mm (2.6 mm) positioned to avoid the true ostium)  . GERD (gastroesophageal reflux disease)   . History of hiatal hernia    40 yrs ago  . HYPERLIPIDEMIA 01/26/2009  .  HYPERTENSION 01/26/2009  . 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 lower back pain, s/p cervical neck surgery"stiffness" remains  . Transfusion history    infant "anemia"    PAST SURGICAL HISTORY   Past Surgical History:  Procedure Laterality Date  . APPENDECTOMY  ~ 1959  . BACK SURGERY    . CERVICAL DISC ARTHROPLASTY N/A 07/30/2018   Procedure: Cervical five-six Cervical six-seven Artificial disc replacement;  Surgeon: Kristeen Miss, MD;  Location: [facility];  Service: Neurosurgery;  Laterality: N/A;  . CERVICAL LAMINECTOMY  1997   C 4 and C 5  . CHOLECYSTECTOMY N/A 04/15/2014   Procedure: LAPAROSCOPIC CHOLECYSTECTOMY WITH INTRAOPERATIVE CHOLANGIOGRAM;  Surgeon: Armandina Gemma, MD;  Location: [facility];  Service: General;  Laterality: N/A;  . COLONOSCOPY    . CORONARY BALLOON ANGIOPLASTY N/A 02/26/2019   Procedure: CORONARY BALLOON ANGIOPLASTY;  Surgeon: Leonie Man, MD;  Location: [facility];  Service: Cardiovascular -> scoring balloon PTCA (Wolverine 2.0 mm) of ostial D2 85% reducing to 20-30%.  . CORONARY STENT INTERVENTION N/A 02/26/2019   Procedure: CORONARY STENT INTERVENTION;  Surgeon: Leonie Man, MD;  Location: [facility];; PCI ostial RPDA (initial attempt of PTCA only led to small local tear/dissection covered with stent) Resolute Onyx 2.5 mm x 15 mm (2.6 mm  . CORONARY STENT INTERVENTION N/A 07/05/2019   Procedure: CORONARY STENT INTERVENTION;  Surgeon: Leonie Man, MD;  Location: [facility];  Service: Cardiovascular;; ostial D1 95% (restenosis of PTCA site) -> DES PCI RESOLUTE ONYX 2.25 mm x 50 mm (2.6 mm) positioned to avoid the true ostium.   . ESOPHAGOGASTRODUODENOSCOPY N/A 03/13/2014   Procedure: ESOPHAGOGASTRODUODENOSCOPY (EGD);  Surgeon: Wonda Horner, MD;  Location: [facility];  Service: Endoscopy;  Laterality: N/A;  . INTRAVASCULAR PRESSURE WIRE/FFR STUDY N/A 02/26/2019   Procedure: INTRAVASCULAR PRESSURE WIRE/FFR STUDY;   Surgeon: Leonie Man, MD;  Location: [facility];  Service: Cardiovascular;; mCx ~65% - DFR 0.92, FR 0.82 - BORDERLINE--> MED Rx  . LEFT HEART CATH AND CORONARY ANGIOGRAPHY N/A 02/26/2019   Procedure: LEFT HEART CATH AND CORONARY ANGIOGRAPHY;  Surgeon: Leonie Man, MD;  Location: [facility];  EF 55-65%. mCx 65% (FFR 0.82 - Med Rx). D2 85% - Wolverine Scoring Balloon PTCA (2.0 mm). Ost rPDA - 90% -Resolute Onyx 2.5 x 15 (2.6 mm).   . LEFT HEART CATH AND CORONARY ANGIOGRAPHY N/A 07/05/2019   Procedure: LEFT HEART CATH AND CORONARY ANGIOGRAPHY;  Surgeon: Leonie Man, MD;  Location: [facility];; ostial D1 95% (restenosis of PTCA site) -> DES PCI.  PDA stent widely patent.  Mid CX stable 65% lesion.  Proximal RCA 40%.  Mid RCA 45%.  Normal EDP.  [name] 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  . SHOULDER ARTHROSCOPY Bilateral   . SHOULDER ARTHROSCOPY W/ ROTATOR CUFF REPAIR Bilateral 2004-2009   left 2004, right 2009  . VASECTOMY    . WISDOM TOOTH EXTRACTION      Cath-PCI 02/26/2019: EF 55-65%. mCx 65% (FFR 0.82 -  Med Rx). D2 85% - Wolverine Scoring Balloon PTCA (2.0 mm). Ost rPDA - 90% -Resolute Onyx 2.5 x 15 (2.6 mm).   (Radial cath complicated by hematoma)        MEDICATIONS/ALLERGIES   Current Meds  Medication Sig  . aspirin EC 81 MG tablet Take 1 tablet (81 mg total) by mouth every evening. (Patient taking differently: Take 81 mg by mouth at bedtime. )  . baclofen (LIORESAL) 10 MG tablet Take 10 mg by mouth daily as needed for muscle spasms.  . bimatoprost (LUMIGAN) 0.03 % ophthalmic solution Place 1 drop into both eyes at bedtime.  . clopidogrel (PLAVIX) 75 MG tablet Take 1 tablet (75 mg total) by mouth daily.  . Evolocumab (REPATHA SURECLICK) [id number] MG/ML SOAJ Inject 140 mg into the skin every 14 (fourteen) days.  . hydrochlorothiazide (MICROZIDE) 12.5 MG capsule Take 1 capsule (12.5 mg total) by mouth as needed. Take a potassium  20 meq along  with medication  . HYDROCORTISONE EX Apply 1 application topically 2 (two) times daily as needed (insect bites/itching).  . isosorbide mononitrate (IMDUR) 30 MG 24 hr tablet Take 1 tablet (30 mg total) by mouth daily.  . nitroGLYCERIN (NITROSTAT) 0.4 MG SL tablet PLACE 1 TABLET (0.4 MG TOTAL) UNDER THE TONGUE EVERY 5 (FIVE) MINUTES AS NEEDED FOR CHEST PAIN.  [name] potassium chloride SA (KLOR-CON) 20 MEQ tablet Take 1 tablet (20 mEq total) by mouth as needed.  . traMADol (ULTRAM) 50 MG tablet 1-2 by mouth BID as needed (Patient taking differently: Take 50 mg by mouth every 6 (six) hours as needed for moderate pain or severe pain. )  . [DISCONTINUED] hydrochlorothiazide (MICROZIDE) 12.5 MG capsule TAKE 1 CAPSULE BY MOUTH  DAILY (Patient taking differently: Take 12.5 mg by mouth daily. )  . [DISCONTINUED] isosorbide mononitrate (IMDUR) 30 MG 24 hr tablet Take 60 mg by mouth daily.   . [DISCONTINUED] metoprolol succinate (TOPROL-XL) 50 MG 24 hr tablet TAKE 1 TABLET BY MOUTH  DAILY WITH OR IMMEDIATLEY  FOLLOWING A MEAL (Patient taking differently: Take 25 mg by mouth at bedtime. )  . [DISCONTINUED] potassium chloride SA (K-DUR) 20 MEQ tablet Take 1 tablet (20 mEq total) by mouth daily.  . [DISCONTINUED] TURMERIC PO Take 3,000 mg by mouth daily.  . [DISCONTINUED] valsartan (DIOVAN) 160 MG tablet TAKE 1 TABLET BY MOUTH  DAILY (Patient taking differently: Take 160 mg by mouth daily. )    Allergies  Allergen Reactions  . Penicillins Itching    Has patient had a PCN reaction causing immediate rash, facial/tongue/throat swelling, SOB or lightheadedness with hypotension: no Has patient had a PCN reaction causing severe rash involving mucus membranes or skin necrosis: unkn Has patient had a PCN reaction that required hospitalization: no Has patient had a PCN reaction occurring within the last 10 years: no If all of the above answers are "NO", then may proceed with Cephalosporin use.     SOCIAL HISTORY/FAMILY  HISTORY   Social History   Tobacco Use  . Smoking status: Former Smoker    Packs/day: 1.00    Years: 5.00    Pack years: 5.00    Types: Cigarettes    Quit date: 10/17/1968    Years since quitting: 50.7  . Smokeless tobacco: Never Used  Substance Use Topics  . Alcohol use: Not Currently    Comment: "quit drinking ~ 10/1968  . Drug use: No   Social History   Social History Narrative  . Not on file  Family History family history includes Arthritis in his sister; Hypertension in his father and mother; Stroke (age of onset: 88) in his sister.   OBJCTIVE -PE, EKG, labs   Wt Readings from Last 3 Encounters:  07/18/19 194 lb (88 kg)  07/05/19 180 lb (81.6 kg)  06/28/19 187 lb (84.8 kg)    Physical Exam: BP 132/74   Pulse 68   Temp (!) 96.6 F (35.9 C)   Ht 6' (1.829 m)   Wt 194 lb (88 kg)   SpO2 97%   BMI 26.31 kg/m  Physical Exam  Constitutional: He is oriented to person, place, and time. He appears well-developed and well-nourished. No distress.  Healthy-appearing.  Well-groomed.  HENT:  Head: Normocephalic and atraumatic.  Eyes: Conjunctivae and EOM are normal.  Neck: Normal range of motion. Neck supple. No hepatojugular reflux and no JVD present. Carotid bruit is not present.  Cardiovascular: Normal rate, regular rhythm, intact distal pulses and normal pulses.  No extrasystoles are present. PMI is not displaced. Exam reveals no gallop and no friction rub.  No murmur heard. Pulmonary/Chest: Effort normal and breath sounds normal. No respiratory distress. He has no wheezes. He has no rales.  Abdominal: Soft. Bowel sounds are normal. He exhibits no distension. There is no abdominal tenderness. There is no rebound.  No palpable HSM  Musculoskeletal: Normal range of motion.        General: No edema.     Comments: Strength 5 out of 5  Neurological: He is alert and oriented to person, place, and time. Cranial nerve deficit: Grossly intact.  Psychiatric: He has a  normal mood and affect. His behavior is normal. Judgment and thought content normal.  Vitals reviewed.    Adult ECG Report Normal sinus rhythm, rate 64 bpm.  Normal axis, intervals durations.-Abnormal EKG.  Stable.  Recent Labs:    Lab Results  Component Value Date   CHOL 110 05/06/2019   HDL 37.40 (L) 05/06/2019   LDLCALC 53 05/06/2019   LDLDIRECT 129.0 08/19/2016   TRIG 101.0 05/06/2019   CHOLHDL 3 05/06/2019   Lab Results  Component Value Date   CREATININE 0.97 07/01/2019   BUN 11 07/01/2019   NA 141 07/01/2019   K 4.5 07/01/2019   CL 103 07/01/2019   CO2 22 07/01/2019    ASSESSMENT/PLAN    Problem List Items Addressed This Visit    Coronary artery disease involving native coronary artery of native heart with unstable angina pectoris ([place]) - Primary (Chronic)    Finally appears to be angina free now after the second stent placement.  Had significant recall of the PTCA site diagonal branch.  Now treated with DES stent.  Plan:  With no further angina, we will reduce Imdur to 30 mg daily.  Continue Toprol but reduced to 12.5 mg daily.  On Repatha for hyperlipidemia.  On ARB  On aspirin plus Plavix --> would like to make it at a minimum of 1 month following most recent PCI prior to having to suspend for hip injections.  Would prefer to last through the end of January (however if pain is unbearable, we would have to agree to suspend aspirin plus Plavix for 5 days preinjection)  Anticipate that he may very well need hip surgery, would want to wait at least 3 preferably 6 months Guzman PCI to hold for 7 days for surgery.       Relevant Medications   valsartan (DIOVAN) 320 MG tablet   metoprolol succinate (TOPROL XL)  25 MG 24 hr tablet   isosorbide mononitrate (IMDUR) 30 MG 24 hr tablet   hydrochlorothiazide (MICROZIDE) 12.5 MG capsule   Other Relevant Orders   EKG 12-Lead   Hyperlipidemia with target LDL less than 70 - statin intolerant (Chronic)    On reduced dosing  interval of Repatha.  LDL now looks great on his current medication.  Down to 2 with total cholesterol of 110.      Relevant Medications   valsartan (DIOVAN) 320 MG tablet   metoprolol succinate (TOPROL XL) 25 MG 24 hr tablet   isosorbide mononitrate (IMDUR) 30 MG 24 hr tablet   hydrochlorothiazide (MICROZIDE) 12.5 MG capsule   Essential hypertension (Chronic)    BP is stable.  However curious as to using HCTZ plus a potassium supplement for the benefit were getting.  With reducing beta-blocker dose when increased ARB.  We will simply increase it further and DC the HCTZ as well.  Plan  DC HCTZ, and change to as needed for edema.-Use potassium supplement in conjunction with HCTZ for swelling.  Increase valsartan 320 mg daily.  Reduce Toprol to 12.5 mg daily      Relevant Medications   valsartan (DIOVAN) 320 MG tablet   metoprolol succinate (TOPROL XL) 25 MG 24 hr tablet   isosorbide mononitrate (IMDUR) 30 MG 24 hr tablet   hydrochlorothiazide (MICROZIDE) 12.5 MG capsule   Fatigue due to treatment (Chronic)    Best I can tell, this is probably related to some deconditioning because of his hip pain but also could potentially be related to beta-blocker.  Plan: Reduce beta-blocker dose to Toprol 12.5 mg daily.         COVID-19 Education: The signs and symptoms of COVID-19 were discussed with the patient and how to seek care for testing (follow up with PCP or arrange E-visit).   The importance of social distancing was discussed today.  I spent a total of 7minutes with the patient and chart review. >  50% of the time was spent in direct patient consultation.  Additional time spent with chart review (studies, outside notes, etc): 8 Total Time: 4260 min   Current medicines are reviewed at length with the patient today.  (+/- concerns) he asked about potassium supplementation, duration of Plavix use etc.   Patient Instructions / Medication Changes & Studies & Tests Ordered    Patient Instructions  Medication Instructions:  -Discontinue  Regular dose of HCTZ ( hydrochlorothiazide) and potassium  Only use as needed for  Swelling  - if you use  Take 1 tablet of HCTZ and Potassium  - Decrease Imdur  To 30 mg  One tablet daily -- Decrease  Toprol ( Metoprolol succinate ) to 12.5 mg  ( 1/2 of 25 mg tablet)            Daily - Increase Valsartan (Diovan) 320 mg one tablet  Daily   *If you need a refill on your cardiac medications before your next appointment, please call your pharmacy*  Lab Work: Not needed  Testing/Procedures: Not needed  Follow-Up: At [facility], you and your health needs are our priority.  As part of our continuing mission to provide you with exceptional heart care, we have created designated Provider Care Teams.  These Care Teams include your primary Cardiologist (physician) and Advanced Practice Providers (APPs -  Physician Assistants and Nurse Practitioners) who all work together to provide you with the care you need, when you need it.  Your next appointment:  3 to 4 month(s)  The format for your next appointment:   Virtual Visit   Provider:   Glenetta Hew, MD  Other Instructions   Dr Ellyn Hack would like for you to wait until the end of Jan 2021 to stop Plavix  For hip injections ,but if not  you could potentially hold Plavix Beginning of Jan 2021 for 5 days prior to injection     Studies Ordered:   Orders Placed This Encounter  Procedures  . EKG 12-Lead     Glenetta Hew, M.D., M.S. Interventional Cardiologist   Pager # [phone] Phone # [phone] [address] [place], [place] [address]   Thank you for choosing Heartcare at [facility]!!

## 2019-07-18 NOTE — Assessment & Plan Note (Signed)
BP is stable.  However curious as to using HCTZ plus a potassium supplement for the benefit were getting.  With reducing beta-blocker dose when increased ARB.  We will simply increase it further and DC the HCTZ as well.  Plan  DC HCTZ, and change to as needed for edema.-Use potassium supplement in conjunction with HCTZ for swelling.  Increase valsartan 320 mg daily.  Reduce Toprol to 12.5 mg daily

## 2019-07-18 NOTE — Patient Instructions (Addendum)
Medication Instructions:  -Discontinue  Regular dose of HCTZ ( hydrochlorothiazide) and potassium  Only use as needed for  Swelling  - if you use  Take 1 tablet of HCTZ and Potassium  - Decrease Imdur  To 30 mg  One tablet daily -- Decrease  Toprol ( Metoprolol succinate ) to 12.5 mg  ( 1/2 of 25 mg tablet)            Daily - Increase Valsartan (Diovan) 320 mg one tablet  Daily   *If you need a refill on your cardiac medications before your next appointment, please call your pharmacy*  Lab Work: Not needed  Testing/Procedures: Not needed  Follow-Up: At [facility], you and your health needs are our priority.  As part of our continuing mission to provide you with exceptional heart care, we have created designated Provider Care Teams.  These Care Teams include your primary Cardiologist (physician) and Advanced Practice Providers (APPs -  Physician Assistants and Nurse Practitioners) who all work together to provide you with the care you need, when you need it.  Your next appointment:   3 to 4 month(s)  The format for your next appointment:   Virtual Visit   Provider:   Glenetta Hew, MD  Other Instructions   Dr Ellyn Hack would like for you to wait until the end of Jan 2021 to stop Plavix  For hip injections ,but if not  you could potentially hold Plavix Beginning of Jan 2021 for 5 days prior to injection

## 2019-07-18 NOTE — Assessment & Plan Note (Signed)
Finally appears to be angina free now after the second stent placement.  Had significant recall of the PTCA site diagonal branch.  Now treated with DES stent.  Plan:  With no further angina, we will reduce Imdur to 30 mg daily.  Continue Toprol but reduced to 12.5 mg daily.  On Repatha for hyperlipidemia.  On ARB  On aspirin plus Plavix --> would like to make it at a minimum of 1 month following most recent PCI prior to having to suspend for hip injections.  Would prefer to last through the end of January (however if pain is unbearable, we would have to agree to suspend aspirin plus Plavix for 5 days preinjection)  Anticipate that he may very well need hip surgery, would want to wait at least 3 preferably 6 months post PCI to hold for 7 days for surgery.

## 2019-07-18 NOTE — Assessment & Plan Note (Signed)
On reduced dosing interval of Repatha.  LDL now looks great on his current medication.  Down to 53 with total cholesterol of 110.

## 2019-08-06 ENCOUNTER — Telehealth (HOSPITAL_COMMUNITY): Payer: Self-pay

## 2019-08-06 NOTE — Telephone Encounter (Signed)
  Successful telephone encounter to [name] to discuss his medical hold status from Cardiac Rehab secondary to hip pain and discomfort and need for injection prior to exercise. Per patient, he was scheduled for an injection this month however he had another stent placed in November and unable to discontinue plavix for injection until late Jan 2021. Patient states he remains interested in onsite CR as well as virtual however he is unable to exercise at this time. He states he will request second referral from cardiologist once hip pain resolved. Koby Pickup E. Laray Anger, BSN

## 2019-08-06 NOTE — Telephone Encounter (Signed)
Unsuccessful telephone encounter to [name] to discuss enrollment in virtual cardiac rehab secondary to patients inability to participate previously related to severe hip discomfort and need for additional injections. HIPAA compliant VM message left requesting call back to [phone]. Jomarion Mish E. Laray Anger, BSN

## 2019-08-13 ENCOUNTER — Telehealth: Payer: Self-pay | Admitting: Cardiology

## 2019-08-13 DIAGNOSIS — Z79899 Other long term (current) drug therapy: Secondary | ICD-10-CM

## 2019-08-13 NOTE — Telephone Encounter (Signed)
Routed to [facility] to review for possible side effects and for any recommendations?

## 2019-08-13 NOTE — Telephone Encounter (Signed)
Patient notified of [facility] recommendations via MyChart message.  Med list updated. BMET ordered

## 2019-08-13 NOTE — Telephone Encounter (Signed)
   Patient requesting a response to MyChart message regarding Valsartan possibly causing bodyaches.  Please call

## 2019-08-13 NOTE — Telephone Encounter (Signed)
Have patient cut valsartan back to previous 160 mg dose.  He should re-start the hctz 12.5 mg once daily.  Do not have him re-start the potassium supplement at this time.  Ask him to go to lab in 2 weeks and we will check potassium level then.  Continue monitoring home blood pressure.

## 2019-08-26 ENCOUNTER — Other Ambulatory Visit: Payer: Self-pay | Admitting: Orthopaedic Surgery

## 2019-08-26 ENCOUNTER — Telehealth: Payer: Self-pay | Admitting: Orthopaedic Surgery

## 2019-08-26 DIAGNOSIS — M25552 Pain in left hip: Secondary | ICD-10-CM

## 2019-08-26 NOTE — Telephone Encounter (Signed)
Not sure where he had his hip injected ? Dr Ernestina Patches. If so could check with him re injecting both. Otherwise if at [facility] imaging then only one hip at a time

## 2019-08-26 NOTE — Telephone Encounter (Signed)
Ok for injection  

## 2019-08-26 NOTE — Telephone Encounter (Signed)
Please advise 

## 2019-08-26 NOTE — Telephone Encounter (Signed)
1 month

## 2019-08-26 NOTE — Telephone Encounter (Signed)
Patient called.   [name] ordered an injection for him back in October and he is requesting to have the same one done again.   Call back number: [phone]

## 2019-08-26 NOTE — Telephone Encounter (Signed)
Yes, its at [facility]. How long between hip injections does he have to wait before getting the other side done? Thanks!

## 2019-08-26 NOTE — Telephone Encounter (Signed)
Spoke with patient and entered order for his left hip. He wants to know soon he could have his right hip injected as well? I told him that I would call him tomorrow afternoon when Dr.[name] is in the office.

## 2019-08-27 ENCOUNTER — Telehealth: Payer: Self-pay

## 2019-08-27 LAB — BASIC METABOLIC PANEL
BUN/Creatinine Ratio: 14 (ref 10–24)
BUN: 12 mg/dL (ref 8–27)
CO2: 24 mmol/L (ref 20–29)
Calcium: 9.4 mg/dL (ref 8.6–10.2)
Chloride: 102 mmol/L (ref 96–106)
Creatinine, Ser: 0.84 mg/dL (ref 0.76–1.27)
GFR calc Af Amer: 100 mL/min/{1.73_m2} (ref >59)
GFR calc non Af Amer: 87 mL/min/{1.73_m2} (ref >59)
Glucose: 102 mg/dL — ABNORMAL HIGH (ref 65–99)
Potassium: 4.2 mmol/L (ref 3.5–5.2)
Sodium: 141 mmol/L (ref 134–144)

## 2019-08-27 NOTE — Telephone Encounter (Signed)
I s/w Roberta at [facility] and informed her that the pt will need to hold off on his injection until after the end of January 2021 as pt recently had stent placed. Angelita Ingles is agreeable to plan of care. I assured Angelita Ingles that I will send my notes to pre op team for any further advice. Assured we will fax clearance notes as final clearance notes from the pre op provider. Angelita Ingles thanked me for the call.

## 2019-08-27 NOTE — Telephone Encounter (Signed)
Spoke with patient and relayed information. He will call after left hip injection and give [place] an update with how he would like to proceed.

## 2019-08-27 NOTE — Telephone Encounter (Signed)
    [facility] Pre-operative Risk Assessment    Request for surgical clearance:  1. What type of surgery is being performed? LEFT HIP INJECTION   2. When is this surgery scheduled? TBD   3. What type of clearance is required (medical clearance vs. Pharmacy clearance to hold med vs. Both)? PHARMACY  4. Are there any medications that need to be held prior to surgery and how long? PLAVIX-5DAYS   5. Practice name and name of physician performing surgery? [facility]     6. What is your office phone number [phone]    7.   What is your office fax number [phone]  8.   Anesthesia type (None, local, MAC, general) ? NOT LISTED  PLEASE SPECIFY-THIS INJECTION ONLY -OR- THIS INJECTION AN ADDITIONAL FOR THE NEXT 6 MONTHS -OR- NOT GRANTED.

## 2019-08-27 NOTE — Telephone Encounter (Signed)
So this is a difficult situation with Luis Guzman.  We talked about it in detail and I do think that if we can wait to the end of January/early February.  I did do his shots, he should be okay based on what happened the last time.  I do think I would like for him to reload with 4 tablets (300 in the room) on the first dose once he restarts.  As for the question about future injections in the next 6 months, I think if he does well this time around, we should be okay going forward with the plan to potentially hold Plavix 5 days prior to procedures and reload on the next day.  Glenetta Hew, MD

## 2019-08-27 NOTE — Telephone Encounter (Signed)
Can we find out when this injection is scheduled for? Patient recently had stent placed and per Dr. Allison Quarry last note, he would like to continue dual antiplatelet therapy through the end of January before holding it.   Thank you!

## 2019-08-27 NOTE — Telephone Encounter (Signed)
Hi Dr Ellyn Hack. Luis Guzman is planning on having another hip injection soon. I saw your note from December and your recommendation for waiting until the end of January before coming of Plavix for 5 days given recent stenting. I spoke with patient today. Pain is bearable for now so he is OK with this. He did have a questions though... He said the last time he had a hip injection, he was told to reload with Plavix to make up for the doses he missed. Do you want him to do this again?   Also [facility] asked if permission to hold Plavix for 5 days could be used for any additional injections needed over the next 6 months as well? Is that OK with you?   Please route response back to [facility] PREOP.  Thank you!

## 2019-08-28 ENCOUNTER — Telehealth: Payer: Self-pay | Admitting: Student

## 2019-08-28 NOTE — Telephone Encounter (Addendum)
   Patient is planning on having hip injection probably in early February. Please see pre-op telephone note for more information. Dr. Ellyn Hack gave OK to hold Plavix for 5 days prior to injection. However, he would like patient to reload with Plavix 300mg  after the injection. I called patient to discuss this with him but he did not answer. Left message for patient to call back and ask to speak to me.    [name], PA-C 08/28/2019 10:23 AM  Addendum:  Patient returned call and I gave him the above recommendations. He voiced understanding and agreed.  [name], PA-C 08/28/2019 10:59 AM

## 2019-08-28 NOTE — Telephone Encounter (Signed)
   Primary Cardiologist: Glenetta Hew, MD  Chart reviewed as part of pre-operative protocol coverage. Patient has a history of CAD s/p DES to RPDA and balloon angioplasty to 1st diagonal in 02/2019 and then DES to 1st diagonal at place of previous angioplasty on 07/05/2019. Also has history of hypertension, hyperlipidemia, GERD, and hip pain. Patient recently seen by Dr. Ellyn Hack on 07/18/2019 at which time he reported feeling fatigued but otherwise was doing well from a cardiac standpoint. I called patient on 08/27/2019 and he reports no changes since last visit. No chest pain, shortness of breath, CHF symptoms, palpitations, or syncope. He continues to be very limited by hip pain. Plan is for another left hip injection sometime in the next few weeks. Given past medical history and time since last visit, based on ACC/AHA guidelines, Luis Guzman would be at acceptable risk for the planned procedure without further cardiovascular testing.   Per Dr. Allison Quarry last note, he would like to continue uninterrupted dual antiplatelet therapy through the end of January 2021 before holding Plavix for 5 days prior to injection. I spoke with patient and it sounds like pain is bearable for the time being and he is okay waiting until beginning of February. Dr. Ellyn Hack would also like patient to reload with Plavix 300mg  after the injection - our office will call and discuss this with him.  Given patient just recently had a stent placed, will only give the OK to hold Plavix for this one injection for the time being. If patient does well with this from a cardiac standpoint, can likely give the OK to hold for additional injections next time.   I will route this recommendation to the requesting party via Epic fax function and remove from pre-op pool.   Please call with questions.    [name], PA-C 08/28/2019, 9:46 AM

## 2019-08-30 ENCOUNTER — Ambulatory Visit: Payer: Medicare Other | Attending: Internal Medicine

## 2019-08-30 ENCOUNTER — Telehealth: Payer: Self-pay | Admitting: *Deleted

## 2019-08-30 DIAGNOSIS — Z23 Encounter for immunization: Secondary | ICD-10-CM | POA: Diagnosis not present

## 2019-08-30 NOTE — Telephone Encounter (Signed)
Received fax from [facility] imaging letting us know that pt has been scheduled for hip injection on 09/16/19 at 10:00am at [address] location.

## 2019-08-30 NOTE — Progress Notes (Signed)
   Covid-19 Vaccination Clinic  Name:  Luis Guzman    MRN: [id number] DOB: 05-Jun-1946  08/30/2019  [name] was observed post Covid-19 immunization for 15 minutes without incidence. He was provided with Vaccine Information Sheet and instruction to access the V-Safe system.   [name] was instructed to call 911 with any severe reactions post vaccine: [name] Difficulty breathing  . Swelling of your face and throat  . A fast heartbeat  . A bad rash all over your body  . Dizziness and weakness    Immunizations Administered    Name Date Dose VIS Date Route   Pfizer COVID-19 Vaccine 08/30/2019 10:20 AM 0.3 mL 07/26/2019 Intramuscular   Manufacturer: [employer]   Lot: [id number]   [place]: [id number]

## 2019-09-11 ENCOUNTER — Telehealth: Payer: Self-pay | Admitting: Cardiology

## 2019-09-11 ENCOUNTER — Other Ambulatory Visit: Payer: Self-pay | Admitting: Cardiology

## 2019-09-11 MED ORDER — REPATHA SURECLICK 140 MG/ML ~~LOC~~ SOAJ: 140.0000 mg | SUBCUTANEOUS | 3 refills | Status: DC

## 2019-09-11 NOTE — Telephone Encounter (Signed)
Called the pt and sent the new rx. The pt was refilling to soon. Pt voiced understanding

## 2019-09-11 NOTE — Telephone Encounter (Signed)
Patient said he was only able to get a 30 day supply of Evolocumab (REPATHA SURECLICK) [id number] MG/ML SOAJ from his pharmacy, because the Prior Authorization the pharmacy has expires in February. The patient thought he got a letter from his insurance company stating that his prior authorization was good through the end of 2021, but he may have accidentally shredded the letter.  He has 6 weeks worth of medicine (3 doses) but wants to get ahead of the curve to get a new prior authorization if necessary

## 2019-09-11 NOTE — Telephone Encounter (Signed)
Will route to PharmD.  Thank you!  

## 2019-09-16 ENCOUNTER — Ambulatory Visit
Admission: RE | Admit: 2019-09-16 | Discharge: 2019-09-16 | Disposition: A | Discharge status: Home / Self Care | Payer: Medicare Other | Source: Ambulatory Visit | Attending: Orthopaedic Surgery | Admitting: Orthopaedic Surgery

## 2019-09-16 DIAGNOSIS — M25552 Pain in left hip: Secondary | ICD-10-CM | POA: Diagnosis not present

## 2019-09-16 MED ORDER — IOPAMIDOL (ISOVUE-M 200) INJECTION 41%
1.0000 mL | Freq: Once | INTRAMUSCULAR | Status: AC
  Administered 2019-09-16: 1 mL via INTRA_ARTICULAR

## 2019-09-16 MED ORDER — METHYLPREDNISOLONE ACETATE 40 MG/ML INJ SUSP (RADIOLOG
120.0000 mg | Freq: Once | INTRAMUSCULAR | Status: AC
  Administered 2019-09-16: 120 mg via INTRA_ARTICULAR

## 2019-09-20 ENCOUNTER — Ambulatory Visit: Payer: Medicare Other | Attending: Internal Medicine

## 2019-09-20 DIAGNOSIS — Z23 Encounter for immunization: Secondary | ICD-10-CM | POA: Insufficient documentation

## 2019-09-20 NOTE — Progress Notes (Signed)
   Covid-19 Vaccination Clinic  Name:  Luis Guzman    MRN: [id number] DOB: [birth year]  09/20/2019  Luis Guzman was observed post Covid-19 immunization for 15 minutes without incidence. He was provided with Vaccine Information Sheet and instruction to access the V-Safe system.   Luis Guzman was instructed to call 911 with any severe reactions post vaccine: [name] Difficulty breathing  . Swelling of your face and throat  . A fast heartbeat  . A bad rash all over your body  . Dizziness and weakness    Immunizations Administered    Name Date Dose VIS Date Route   Pfizer COVID-19 Vaccine 09/20/2019 11:15 AM 0.3 mL 07/26/2019 Intramuscular   Manufacturer: [place]   Lot: [id number]   [place]: [id number]

## 2019-09-27 DIAGNOSIS — H40053 Ocular hypertension, bilateral: Secondary | ICD-10-CM | POA: Diagnosis not present

## 2019-10-17 ENCOUNTER — Encounter: Payer: Self-pay | Admitting: Cardiology

## 2019-10-17 ENCOUNTER — Telehealth (INDEPENDENT_AMBULATORY_CARE_PROVIDER_SITE_OTHER): Payer: Medicare Other | Admitting: Cardiology

## 2019-10-17 ENCOUNTER — Telehealth: Payer: Self-pay | Admitting: Orthopaedic Surgery

## 2019-10-17 ENCOUNTER — Other Ambulatory Visit: Payer: Self-pay

## 2019-10-17 VITALS — BP 130/78 | HR 76 | Ht 73.0 in | Wt 185.0 lb

## 2019-10-17 DIAGNOSIS — Z01810 Encounter for preprocedural cardiovascular examination: Secondary | ICD-10-CM

## 2019-10-17 DIAGNOSIS — Z7902 Long term (current) use of antithrombotics/antiplatelets: Secondary | ICD-10-CM | POA: Diagnosis not present

## 2019-10-17 DIAGNOSIS — I25119 Atherosclerotic heart disease of native coronary artery with unspecified angina pectoris: Secondary | ICD-10-CM | POA: Diagnosis not present

## 2019-10-17 DIAGNOSIS — Z955 Presence of coronary angioplasty implant and graft: Secondary | ICD-10-CM | POA: Diagnosis not present

## 2019-10-17 DIAGNOSIS — M25551 Pain in right hip: Secondary | ICD-10-CM

## 2019-10-17 DIAGNOSIS — I2510 Atherosclerotic heart disease of native coronary artery without angina pectoris: Secondary | ICD-10-CM

## 2019-10-17 DIAGNOSIS — E785 Hyperlipidemia, unspecified: Secondary | ICD-10-CM | POA: Diagnosis not present

## 2019-10-17 DIAGNOSIS — I10 Essential (primary) hypertension: Secondary | ICD-10-CM | POA: Diagnosis not present

## 2019-10-17 DIAGNOSIS — Z7189 Other specified counseling: Secondary | ICD-10-CM | POA: Diagnosis not present

## 2019-10-17 DIAGNOSIS — R5383 Other fatigue: Secondary | ICD-10-CM | POA: Diagnosis not present

## 2019-10-17 DIAGNOSIS — M25552 Pain in left hip: Secondary | ICD-10-CM

## 2019-10-17 DIAGNOSIS — Z7982 Long term (current) use of aspirin: Secondary | ICD-10-CM

## 2019-10-17 NOTE — Telephone Encounter (Signed)
Spoke with patient and ordered bilateral hip injections to be done at [facility].

## 2019-10-17 NOTE — Telephone Encounter (Signed)
Patient called asked if he can be referred back to [place] for an injection in both of his hips. Patient asked if the injections can be scheduled 2 weeks apart? The number to contact patient is [phone]

## 2019-10-17 NOTE — Patient Instructions (Addendum)
Medication Instructions:   If you have an episode of chest discomfort where you take a nitroglycerin, take additional 2 Plavix that day.  Also increase your Imdur to 2 doses for 2 days  *If you need a refill on your cardiac medications before your next appointment, please call your pharmacy*   Lab Work: n/a   Testing/Procedures: n/a   Follow-Up: At [facility], you and your health needs are our priority.  As part of our continuing mission to provide you with exceptional heart care, we have created designated Provider Care Teams.  These Care Teams include your primary Cardiologist (physician) and Advanced Practice Providers (APPs -  Physician Assistants and Nurse Practitioners) who all work together to provide you with the care you need, when you need it.    Your next appointment:   4 month(s)  The format for your next appointment:   In Person  Provider:   Glenetta Hew, MD   Other Instructions   For future need to have joint injections, would be okay to hold Plavix 5 days preop.  If you hold for 5 days, then the first 2 doses back should be 2 tablets.  If you hold for 7 days the first day would be 300 mg which is 4 tabs

## 2019-10-17 NOTE — Progress Notes (Signed)
im 

## 2019-10-17 NOTE — Telephone Encounter (Signed)
Please advise. Just had left hip injected one month ago at [facility].

## 2019-10-17 NOTE — Progress Notes (Signed)
Virtual Visit via Telephone Note   This visit type was conducted due to national recommendations for restrictions regarding the COVID-19 Pandemic (e.g. social distancing) in an effort to limit this patient's exposure and mitigate transmission in our community.  Due to his co-morbid illnesses, this patient is at least at moderate risk for complications without adequate follow up.  This format is felt to be most appropriate for this patient at this time.  The patient did not have access to video technology/had technical difficulties with video requiring transitioning to audio format only (telephone).  All issues noted in this document were discussed and addressed.  No physical exam could be performed with this format.  Please refer to the patient's chart for his  consent to telehealth for [facility].   Patient has given verbal permission to conduct this visit via virtual appointment and to bill insurance 10/20/2019 10:46 PM     Evaluation Performed:  Follow-up visit  Date:  10/20/2019   ID:  Luis Guzman, Luis Guzman 09-02-1945, MRN [id number]  Patient Location: Home Provider Location: Home  PCP:  Eulas Post, MD  Cardiologist:  Glenetta Hew, MD  Electrophysiologist:  None   Chief Complaint:   Chief Complaint  Patient presents with  . 3 month follow up     has had chest discomfort  ( isolated )  on FEb 9 and Feb 12.   . Coronary Artery Disease    2 episodes requiring nitroglycerin, otherwise no chest pain.  Questions about antiplatelet agents with need for hip injections    History of Present Illness:    WANG TAKASHIMA is a 74 y.o. male with PMH notable for CAD -PCI (PDA and Diagonal) who presents via audio/video conferencing for a telehealth visit today.  COLEMAN QUAYE was last seen July 18, 2019 for post cath follow-up after PCI of the diagonal branch.  Indicated no further angina.  Not a lot of energy.  Still limited by hip pain.  Difficulty getting out of the car.  Decreased Imdur  to 30 mg.  Continue Toprol but decreased to 12.5.  Continue Repatha and ARB.  Discontinued HCTZ  On aspirin Plavix (discussed potential for holding for surgeries/injections.)  Hospitalizations:  . None   Recent - Interim CV studies:   The following studies were reviewed today: . None:  Inerval History   FILIPPO TONA is following up today really to get more advice about his hip injections.  He has had 2 injections on one side and one on the other and has to wait several months in between each but he is hoping to have another injection soon is not sure what he should do.  Last injection was in February.  He says that the injections are not lasting as long as they had before as far as relieving his pains.  From a cardiac standpoint he has been doing fairly well.  As active as he is able to be, he is not really noted that much of symptoms besides on February 9 he had an episode lasting about 10 to 15 minutes of discomfort that occurred at rest.  The pain was not similar to the extent that he had prior to PCI.  He said he did take a nitroglycerin and after several minutes, the symptom went away.  He had one other episode a day or so later for which he took nitroglycerin, but no further episodes.  Otherwise he is not having any heart failure symptoms, signs of  arrhythmias or further chest pain.  Cardiovascular ROS: positive for - chest pain and This was noted above.  Otherwise no further symptoms. negative for - dyspnea on exertion, edema, irregular heartbeat, orthopnea, palpitations, paroxysmal nocturnal dyspnea, rapid heart rate, shortness of breath or Syncope/near syncope TIA/amaurosis fugax.  Claudication   ROS:  Please see the history of present illness.    The patient does not have symptoms concerning for COVID-19 infection (fever, chills, cough, or new shortness of breath).  Review of Systems  Constitutional: Negative for malaise/fatigue (Not really fatigued or malaise, just upset about  not be able to exercise because of his hips) and weight loss.  HENT: Negative for nosebleeds.   Respiratory: Negative for shortness of breath.   Gastrointestinal: Negative for blood in stool and melena.  Genitourinary: Negative for hematuria.  Musculoskeletal: Positive for joint pain (Significant hip pains.  Limit his walking.).  Neurological: Negative for dizziness.  Endo/Heme/Allergies: Negative for environmental allergies.  Psychiatric/Behavioral: Negative.    The patient is practicing social distancing. He has had both of his COVID-19 vaccines.  Past Medical History:  Diagnosis Date  . CAD S/P PTCA & DES PCI - for Progressive Angina 02/26/2019   Cath-PCI 02/26/2019: EF 55-65%. mCx 65% (FFR 0.82 - Med Rx). D2 85% - Wolverine Scoring Balloon PTCA (2.0 mm). Ost rPDA - 90% -Resolute Onyx 2.5 x 15 (2.6 mm). --> 06/2019: staged DES PCI ost D1 (RESOLUTE ONYX 2.25 mm x 50 mm (2.6 mm) positioned to avoid the true ostium)  . GERD (gastroesophageal reflux disease)   . History of hiatal hernia    40 yrs ago  . HYPERLIPIDEMIA 01/26/2009  . HYPERTENSION 01/26/2009  . 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 lower back pain, s/p cervical neck surgery"stiffness" remains  . Transfusion history    infant "anemia"   Past Surgical History:  Procedure Laterality Date  . APPENDECTOMY  ~ 1959  . BACK SURGERY    . CERVICAL DISC ARTHROPLASTY N/A 07/30/2018   Procedure: Cervical five-six Cervical six-seven Artificial disc replacement;  Surgeon: Kristeen Miss, MD;  Location: [place];  Service: Neurosurgery;  Laterality: N/A;  . CERVICAL LAMINECTOMY  1997   C 4 and C 5  . CHOLECYSTECTOMY N/A 04/15/2014   Procedure: LAPAROSCOPIC CHOLECYSTECTOMY WITH INTRAOPERATIVE CHOLANGIOGRAM;  Surgeon: Armandina Gemma, MD;  Location: [facility];  Service: General;  Laterality: N/A;  . COLONOSCOPY    . CORONARY BALLOON ANGIOPLASTY N/A 02/26/2019   Procedure: CORONARY BALLOON ANGIOPLASTY;  Surgeon: Leonie Man, MD;  Location: [place] [facility];  Service: Cardiovascular -> scoring balloon PTCA (Wolverine 2.0 mm) of ostial D2 85% reducing to 20-30%.  . CORONARY STENT INTERVENTION N/A 02/26/2019   Procedure: CORONARY STENT INTERVENTION;  Surgeon: Leonie Man, MD;  Location: [place] [facility];; PCI ostial RPDA (initial attempt of PTCA only led to small local tear/dissection covered with stent) Resolute Onyx 2.5 mm x 15 mm (2.6 mm  . CORONARY STENT INTERVENTION N/A 07/05/2019   Procedure: CORONARY STENT INTERVENTION;  Surgeon: Leonie Man, MD;  Location: [place] [facility];  Service: Cardiovascular;; ostial D1 95% (restenosis of PTCA site) -> DES PCI RESOLUTE ONYX 2.25 mm x 50 mm (2.6 mm) positioned to avoid the true ostium.   . ESOPHAGOGASTRODUODENOSCOPY N/A 03/13/2014   Procedure: ESOPHAGOGASTRODUODENOSCOPY (EGD);  Surgeon: Wonda Horner, MD;  Location: [facility];  Service: Endoscopy;  Laterality: N/A;  . INTRAVASCULAR PRESSURE WIRE/FFR STUDY N/A 02/26/2019   Procedure: INTRAVASCULAR PRESSURE WIRE/FFR STUDY;  Surgeon: Leonie Man, MD;  Location: [place] [facility];  Service: Cardiovascular;; mCx ~65% - DFR 0.92, FR 0.82 - BORDERLINE--> MED Rx  . LEFT HEART CATH AND CORONARY ANGIOGRAPHY N/A 02/26/2019   Procedure: LEFT HEART CATH AND CORONARY ANGIOGRAPHY;  Surgeon: Leonie Man, MD;  Location: [place] [facility];  EF 55-65%. mCx 65% (FFR 0.82 - Med Rx). D2 85% - Wolverine Scoring Balloon PTCA (2.0 mm). Ost rPDA - 90% -Resolute Onyx 2.5 x 15 (2.6 mm).   . LEFT HEART CATH AND CORONARY ANGIOGRAPHY N/A 07/05/2019   Procedure: LEFT HEART CATH AND CORONARY ANGIOGRAPHY;  Surgeon: Leonie Man, MD;  Location: [facility];; ostial D1 95% (restenosis of PTCA site) -> DES PCI.  PDA stent widely patent.  Mid CX stable 65% lesion.  Proximal RCA 40%.  Mid RCA 45%.  Normal EDP.  [name] 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  . SHOULDER ARTHROSCOPY Bilateral   . SHOULDER  ARTHROSCOPY W/ ROTATOR CUFF REPAIR Bilateral 2004-2009   left 2004, right 2009  . VASECTOMY    . WISDOM TOOTH EXTRACTION       Cath- PCI 07/05/2019: ostial D1 95% (restenosis of PTCA site) -> DES PCI RESOLUTE ONYX 2.25 mm x 50 mm (2.6 mm) positioned to avoid the true ostium.  PDA stent widely patent.  Mid CX stable 65% lesion.  Proximal RCA 40%.  Mid RCA 45%.  Normal EDP.    Current Meds  Medication Sig  . aspirin EC 81 MG tablet Take 1 tablet (81 mg total) by mouth every evening. (Patient taking differently: Take 81 mg by mouth at bedtime. )  . baclofen (LIORESAL) 10 MG tablet Take 10 mg by mouth daily as needed for muscle spasms.  . bimatoprost (LUMIGAN) 0.03 % ophthalmic solution Place 1 drop into both eyes at bedtime.  . Brinzolamide-Brimonidine (SIMBRINZA) 1-0.2 % SUSP Apply 1 drop to eye 2 (two) times daily. Take 1 drop in both eyes twice a day  . clopidogrel (PLAVIX) 75 MG tablet Take 1 tablet (75 mg total) by mouth daily.  . Evolocumab (REPATHA SURECLICK) [id number] MG/ML SOAJ Inject 140 mg into the skin every 14 (fourteen) days.  . hydrochlorothiazide (MICROZIDE) 12.5 MG capsule Take 12.5 mg by mouth daily.  [name] HYDROCORTISONE EX Apply 1 application topically 2 (two) times daily as needed (insect bites/itching).  . isosorbide mononitrate (IMDUR) 30 MG 24 hr tablet Take 1 tablet (30 mg total) by mouth daily.  . metoprolol succinate (TOPROL XL) 25 MG 24 hr tablet Take 0.5 tablets (12.5 mg total) by mouth daily.  . nitroGLYCERIN (NITROSTAT) 0.4 MG SL tablet PLACE 1 TABLET (0.4 MG TOTAL) UNDER THE TONGUE EVERY 5 (FIVE) MINUTES AS NEEDED FOR CHEST PAIN.  [name] potassium chloride SA (KLOR-CON) 20 MEQ tablet Take 1 tablet (20 mEq total) by mouth as needed.  . valsartan (DIOVAN) 320 MG tablet Take 160 mg by mouth daily.     Allergies:   Penicillins   Social History   Tobacco Use  . Smoking status: Former Smoker    Packs/day: 1.00    Years: 5.00    Pack years: 5.00    Types: Cigarettes     Quit date: 10/17/1968    Years since quitting: 51.0  . Smokeless tobacco: Never Used  Substance Use Topics  . Alcohol use: Not Currently    Comment: "quit drinking ~ 10/1968  . Drug use: No     Family Hx: The patient's family  history includes Arthritis in his sister; Hypertension in his father and mother; Stroke (age of onset: 47) in his sister.   Labs/Other Tests and Data Reviewed:    EKG:  No ECG reviewed.  Recent Labs: 05/06/2019: ALT 16; TSH 0.61 07/01/2019: Hemoglobin 14.9; Platelets 221 08/26/2019: BUN 12; Creatinine, Ser 0.84; Potassium 4.2; Sodium 141   Recent Lipid Panel Lab Results  Component Value Date/Time   CHOL 110 05/06/2019 09:07 AM   CHOL 242 (H) 03/25/2019 10:11 AM   TRIG 101.0 05/06/2019 09:07 AM   HDL 37.40 (L) 05/06/2019 09:07 AM   HDL 37 (L) 03/25/2019 10:11 AM   CHOLHDL 3 05/06/2019 09:07 AM   LDLCALC 53 05/06/2019 09:07 AM   LDLCALC 157 (H) 03/25/2019 10:11 AM   LDLDIRECT 129.0 08/19/2016 08:38 AM    Wt Readings from Last 3 Encounters:  10/17/19 185 lb (83.9 kg)  07/18/19 194 lb (88 kg)  07/05/19 180 lb (81.6 kg)     Objective:    Vital Signs:  BP 130/78   Pulse 76   Ht 6\' 1"  (1.854 m)   Wt 185 lb (83.9 kg)   BMI 24.41 kg/m   VITAL SIGNS:  reviewed GEN:  no acute distress A&O x 3.  normal Mood & Affect Non-labored respirations   ASSESSMENT & PLAN:    Problem List Items Addressed This Visit    CAD S/P & DES PCI -ostial PDA and D1 - Primary (Chronic)    Status post two-vessel PCI now first in July then in November 2020.  Initial angioplasty site required PCI.  Has had minimal chest pain-2 uses of nitroglycerin since last visit.   Plan for now is to continue Plavix and aspirin for 6 months at which time we would stop aspirin.  Would continue Plavix, but okay to be interrupted from now going forward (is 4 months out from PCI, and likely safe).  He is needing to have hip injections.    -- For future need to have joint injections, would be  okay to hold Plavix 5 days preop.  If you hold for 5 days, then the first 2 doses back should be 2 tablets.  If you hold for 7 days the first day would be 300 mg which is 4 tabs   We would continue Plavix, but albeit interrupted through November 2021, then can discuss maintenance dose aspirin versus Plavix going forward.      Coronary artery disease involving native coronary artery of native heart with angina pectoris (HCC) (Chronic)    Thankfully, not having any additional symptoms besides those to requiring nitroglycerin.  Not sure what those were, could have been spasm related.  Thankfully, no more symptoms.  Is on aspirin and Plavix along with Imdur and low-dose Toprol. Not on statin, is on Repatha.  Continue with low-dose beta-blocker and Toprol, if he has to use as needed nitroglycerin, would like for him to take an additional dose of Imdur for the next 2 days.  We will refill his as needed nitroglycerin.      Hyperlipidemia with target LDL less than 70 - statin intolerant (Chronic)    Remains on Repatha, reduce dosing interval.  LDL level still look good -notable improvement. . Continue Repatha.  No change.  Should be due for follow-up labs probably September.      Essential hypertension (Chronic)    Borderline BP.  He remains on HCTZ although the plan had been to stop that.  We had increased his valsartan up to  320 mg daily, but he is only taking half dose.  Continue to monitor.  Seems to be stable for now.      Fatigue due to treatment (Chronic)    Appears to be better since we reduced his Toprol dose.  I am hoping that once we are able to get his hips fixed and he is able to go back to exercising that we should build to titrate back up to full dose.      Preop cardiovascular exam    Pretty much he would not be in any significant increased risk for cardiac standpoint for his hip injections.  The issue is can to be holding his antiplatelet agent.  He is now just about 4  months out from his last PCI, although not optimal, he is probably safe holding his antiplatelet agents for his hip shots.  I would like for her to stay on clopidogrel through November but okay to be interrupted at this point.  Plan: Okay to hold Plavix 5-7 days preop for surgeries, but with otherwise continue until minimum of November 2021. Okay to proceed with procedures on hip as needed.         COVID-19 Education: The signs and symptoms of COVID-19 were discussed with the patient and how to seek care for testing (follow up with PCP or arrange E-visit).   The importance of social distancing was discussed today.  Time:   Today, I have spent 22 minutes with the patient with telehealth technology discussing the above problems.  Additional 6-minute spent in charting.  Total 28 minutes.  Medication Adjustments/Labs and Tests Ordered: Current medicines are reviewed at length with the patient today.  Concerns regarding medicines are outlined above.   Patient Instructions  Medication Instructions:   If you have an episode of chest discomfort where you take a nitroglycerin, take additional 2 Plavix that day.  Also increase your Imdur to 2 doses for 2 days  *If you need a refill on your cardiac medications before your next appointment, please call your pharmacy*   Lab Work: n/a   Testing/Procedures: n/a   Follow-Up: At [facility], you and your health needs are our priority.  As part of our continuing mission to provide you with exceptional heart care, we have created designated Provider Care Teams.  These Care Teams include your primary Cardiologist (physician) and Advanced Practice Providers (APPs -  Physician Assistants and Nurse Practitioners) who all work together to provide you with the care you need, when you need it.    Your next appointment:   4 month(s)  The format for your next appointment:   In Person  Provider:   Glenetta Hew, MD   Other  Instructions   For future need to have joint injections, would be okay to hold Plavix 5 days preop.  If you hold for 5 days, then the first 2 doses back should be 2 tablets.  If you hold for 7 days the first day would be 300 mg which is 4 tabs     Signed, Glenetta Hew, MD  10/20/2019 10:46 PM    [place]

## 2019-10-17 NOTE — Telephone Encounter (Signed)
thanks

## 2019-10-17 NOTE — Telephone Encounter (Signed)
ok 

## 2019-10-20 ENCOUNTER — Encounter: Payer: Self-pay | Admitting: Cardiology

## 2019-10-20 NOTE — Assessment & Plan Note (Signed)
Borderline BP.  He remains on HCTZ although the plan had been to stop that.  We had increased his valsartan up to 320 mg daily, but he is only taking half dose.  Continue to monitor.  Seems to be stable for now.

## 2019-10-20 NOTE — Assessment & Plan Note (Addendum)
Thankfully, not having any additional symptoms besides those to requiring nitroglycerin.  Not sure what those were, could have been spasm related.  Thankfully, no more symptoms.  Is on aspirin and Plavix along with Imdur and low-dose Toprol. Not on statin, is on Repatha.  Continue with low-dose beta-blocker and Toprol, if he has to use as needed nitroglycerin, would like for him to take an additional dose of Imdur for the next 2 days.  We will refill his as needed nitroglycerin.

## 2019-10-20 NOTE — Assessment & Plan Note (Addendum)
Status post two-vessel PCI now first in July then in November 2020.  Initial angioplasty site required PCI.  Has had minimal chest pain-2 uses of nitroglycerin since last visit.   Plan for now is to continue Plavix and aspirin for 6 months at which time we would stop aspirin.  Would continue Plavix, but okay to be interrupted from now going forward (is 4 months out from PCI, and likely safe).  He is needing to have hip injections.    -- For future need to have joint injections, would be okay to hold Plavix 5 days preop.  If you hold for 5 days, then the first 2 doses back should be 2 tablets.  If you hold for 7 days the first day would be 300 mg which is 4 tabs   We would continue Plavix, but albeit interrupted through November 2021, then can discuss maintenance dose aspirin versus Plavix going forward.

## 2019-10-20 NOTE — Assessment & Plan Note (Signed)
Appears to be better since we reduced his Toprol dose.  I am hoping that once we are able to get his hips fixed and he is able to go back to exercising that we should build to titrate back up to full dose.

## 2019-10-20 NOTE — Assessment & Plan Note (Signed)
Pretty much he would not be in any significant increased risk for cardiac standpoint for his hip injections.  The issue is can to be holding his antiplatelet agent.  He is now just about 4 months out from his last PCI, although not optimal, he is probably safe holding his antiplatelet agents for his hip shots.  I would like for her to stay on clopidogrel through November but okay to be interrupted at this point.  Plan: Okay to hold Plavix 5-7 days preop for surgeries, but with otherwise continue until minimum of November 2021. Okay to proceed with procedures on hip as needed.

## 2019-10-20 NOTE — Assessment & Plan Note (Signed)
Remains on Repatha, reduce dosing interval.  LDL level still look good -notable improvement. . Continue Repatha.  No change.  Should be due for follow-up labs probably September.

## 2019-10-24 ENCOUNTER — Ambulatory Visit
Admission: RE | Admit: 2019-10-24 | Discharge: 2019-10-24 | Disposition: A | Discharge status: Home / Self Care | Payer: Medicare Other | Source: Ambulatory Visit | Attending: Orthopaedic Surgery | Admitting: Orthopaedic Surgery

## 2019-10-24 DIAGNOSIS — M1612 Unilateral primary osteoarthritis, left hip: Secondary | ICD-10-CM | POA: Diagnosis not present

## 2019-10-24 DIAGNOSIS — M25551 Pain in right hip: Secondary | ICD-10-CM

## 2019-10-24 DIAGNOSIS — M25552 Pain in left hip: Secondary | ICD-10-CM

## 2019-10-24 MED ORDER — METHYLPREDNISOLONE ACETATE 40 MG/ML INJ SUSP (RADIOLOG
120.0000 mg | Freq: Once | INTRAMUSCULAR | Status: AC
  Administered 2019-10-24: 120 mg via INTRA_ARTICULAR

## 2019-10-24 MED ORDER — IOPAMIDOL (ISOVUE-M 200) INJECTION 41%
1.0000 mL | Freq: Once | INTRAMUSCULAR | Status: AC
  Administered 2019-10-24: 1 mL via INTRA_ARTICULAR

## 2019-10-25 ENCOUNTER — Telehealth: Payer: Self-pay | Admitting: Cardiology

## 2019-10-25 MED ORDER — NITROGLYCERIN 0.4 MG SL SUBL: 0.4000 mg | SUBLINGUAL_TABLET | SUBLINGUAL | 3 refills | Status: DC | PRN

## 2019-10-25 NOTE — Telephone Encounter (Signed)
New Message  Pt c/o medication issue:  1. Name of Medication:  nitroGLYCERIN (NITROSTAT) 0.4 MG SL tablet  2. How are you currently taking this medication (dosage and times per day)? As needed  3. Are you having a reaction (difficulty breathing--STAT)? No  4. What is your medication issue?  Needs a new prescription.

## 2019-10-25 NOTE — Telephone Encounter (Signed)
Nitroglycerin refill sent to pharmacy.

## 2019-10-30 ENCOUNTER — Other Ambulatory Visit: Payer: Self-pay

## 2019-10-30 ENCOUNTER — Ambulatory Visit (INDEPENDENT_AMBULATORY_CARE_PROVIDER_SITE_OTHER): Payer: Medicare Other

## 2019-10-30 ENCOUNTER — Ambulatory Visit (INDEPENDENT_AMBULATORY_CARE_PROVIDER_SITE_OTHER): Payer: Medicare Other | Admitting: Orthopaedic Surgery

## 2019-10-30 ENCOUNTER — Encounter: Payer: Self-pay | Admitting: Orthopaedic Surgery

## 2019-10-30 VITALS — Ht 73.0 in | Wt 185.0 lb

## 2019-10-30 DIAGNOSIS — M25562 Pain in left knee: Secondary | ICD-10-CM

## 2019-10-30 DIAGNOSIS — G8929 Other chronic pain: Secondary | ICD-10-CM

## 2019-10-30 DIAGNOSIS — I2510 Atherosclerotic heart disease of native coronary artery without angina pectoris: Secondary | ICD-10-CM

## 2019-10-30 DIAGNOSIS — Z9861 Coronary angioplasty status: Secondary | ICD-10-CM | POA: Diagnosis not present

## 2019-10-30 MED ORDER — LIDOCAINE HCL 1 % IJ SOLN
1.0000 mL | INTRAMUSCULAR | Status: AC | PRN
  Administered 2019-10-30: 1 mL

## 2019-10-30 MED ORDER — METHYLPREDNISOLONE ACETATE 40 MG/ML IJ SUSP
13.3300 mg | INTRAMUSCULAR | Status: AC | PRN
  Administered 2019-10-30: 13.33 mg via INTRA_ARTICULAR

## 2019-10-30 NOTE — Progress Notes (Signed)
Office Visit Note   Patient: Luis Guzman           Date of Birth: 03-24-1946           MRN: [id number] Visit Date: 10/30/2019              Requested by: Luis Post, MD [place],  [place] [address] PCP: Luis Post, MD   Assessment & Plan: Visit Diagnoses:  1. Chronic pain of left knee     Plan: Left knee pain is related to the protrusion of one of the transfixion screws from the intramedullary nail inserted 11 years ago.  Long discussion regarding different treatment options including removing the screw.  He could use Voltaren gel and Aleve and try a cortisone injection over the tip which will give him some relief.  Luis Guzman has had several cardiac stents over the past 6 to 7 months and is on Plavix for at least a year.  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  The left is more symptomatic at some point he may want to consider hip replacement but he have to be off the Plavix for a short time.  He would not have to be off the Plavix if he had the proximal screw removed from the IM nail. Office visit about 25 minutes 50% of the time in counseling  Follow-Up Instructions: Return if symptoms worsen or fail to improve.   Orders:  Orders Placed This Encounter  Procedures  . XR KNEE 3 VIEW LEFT   No orders of the defined types were placed in this encounter.     Procedures: Large Joint Inj: R knee on 10/30/2019 11:27 AM Indications: pain and diagnostic evaluation Details: 25 G 1.5 in needle, medial approach  Arthrogram: No  Medications: 1 mL lidocaine 1 %; 13.33 mg methylPREDNISolone acetate 40 MG/ML  Injected the area of point tenderness on the medial tibial plateau consistent with protrusion of one of the fixation screws which has developed a bursa Procedure, treatment alternatives, risks and benefits explained, specific risks discussed. Consent was given by the patient. Immediately prior to procedure a time out was called to verify the correct  patient, procedure, equipment, support staff and site/side marked as required. Patient was prepped and draped in the usual sterile fashion.       Clinical Data: No additional findings.   Subjective: Chief Complaint  Patient presents with  . Left Knee - Pain  Patient presents today for left knee pain. He said that his knee has been hurting since last fall and has progressively worsened. He states that it feels like it grinds medially when he bends his knee. He takes tylenol for pain, but states that it does not help.  He had a stent in July and was suppose to start rehab afterwards. His left hip started hurting so he was unable to do the treadmill in rehab. He was unable to get his left hip injected because he was on plavix. He finally was able to get his left hip injected in October, but states that it did not help. He then developed chest pain afterwards and needed another stent placed at the end of November. He had another hip injection in January and states that it did not help, but then had another one two weeks ago at [facility]. He is wondering if his knee pain is in fact coming from his chronic bilateral hip pain.  Luis relates that he has had a real odyssey  with his heart over the past 7 or 8 months.  He had a cardiac stent in July and the second 1 in November.  He is on Plavix for at least a year.  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  He would like to consider hip replacement surgery over time but realizes that with the Plavix he probably has to wait.  HPI  Review of Systems   Objective: Vital Signs: Ht 6\' 1"  (1.854 m)   Wt 185 lb (83.9 kg)   BMI 24.41 kg/m   Physical Exam Constitutional:      Appearance: He is well-developed.  Eyes:     Pupils: Pupils are equal, round, and reactive to light.  Pulmonary:      Effort: Pulmonary effort is normal.  Skin:    General: Skin is warm and dry.  Neurological:     Mental Status: He is alert and oriented to person, place, and time.  Psychiatric:        Behavior: Behavior normal.     Ortho Exam Guzman does have a limp predominately on the left when he gets up from a sitting position related to his hip osteoarthritis.  In terms of his left knee there is pain over the tip of protruding fixation screw from his IM nail along the medial tibial plateau.  Skin is intact not red there is no evidence of infection.  No significant joint pain.  No effusion.  Full extension and flexion comparable to his right knee.  Specialty Comments:  No specialty comments available.  Imaging: XR KNEE 3 VIEW LEFT  Result Date: 10/30/2019 Films of the left knee were obtained in several projections standing.  There is a prior intramedullary nail in the tibia with 2 proximal fixation screws.  One of the screws protrudes through the medial tibial cortex which I suspect is what is causing his present problem.  Very minimal degenerative changes in the knee with some very small peripheral osteophytes but laterally but no significant joint space narrowing    PMFS History: Patient Active Problem List   Diagnosis Date Noted  . Pain in left knee 10/30/2019  . Fatigue due to treatment 07/18/2019  . Coronary artery disease involving native coronary artery of native heart with angina pectoris ([place] [facility]) 06/28/2019  . Preop cardiovascular exam 05/31/2019  . Hypokalemia 02/27/2019  . CAD S/P & DES PCI -ostial PDA and D1 02/26/2019  . Angina, class III ([facility]) 02/14/2019  . Cervical spondylosis with myelopathy 07/30/2018  . AAA (abdominal aortic aneurysm) without rupture ([facility]) 04/15/2016  . Biliary dyskinesia 04/08/2014  . Abdominal pain, epigastric 04/08/2014  . Pancreatitis, acute 03/12/2014  . Pancreatitis 03/12/2014  . Hyperglycemia 06/25/2012  . ACUTE BRONCHITIS 03/11/2010  .  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 SINUSITIS, ACUTE 06/11/2009  . Hyperlipidemia with target LDL less than 70 - statin intolerant 01/26/2009  . Essential hypertension 01/26/2009   Past Medical History:  Diagnosis Date  . CAD S/P PTCA & DES PCI - for Progressive Angina 02/26/2019   Cath-PCI 02/26/2019: EF 55-65%. mCx 65% (FFR 0.82 - Med Rx). D2 85% - Wolverine Scoring Balloon PTCA (2.0 mm). Ost rPDA - 90% -Resolute Onyx 2.5 x 15 (2.6 mm). --> 06/2019: staged DES PCI ost D1 (RESOLUTE ONYX 2.25 mm x 50 mm (2.6 mm) positioned to avoid the true ostium)  .  GERD (gastroesophageal reflux disease)   . History of hiatal hernia    40 yrs ago  . HYPERLIPIDEMIA 01/26/2009  . HYPERTENSION 01/26/2009  . 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 lower back pain, s/p cervical neck surgery"stiffness" remains  . Transfusion history    infant "anemia"    Family History  Problem Relation Age of Onset  . Hypertension Mother   . Hypertension Father   . Arthritis Sister        rheumatoid  . Stroke Sister 87    Past Surgical History:  Procedure Laterality Date  . APPENDECTOMY  ~ 1959  . BACK SURGERY    . CERVICAL DISC ARTHROPLASTY N/A 07/30/2018   Procedure: Cervical five-six Cervical six-seven Artificial disc replacement;  Surgeon: Kristeen Miss, MD;  Location: [facility];  Service: Neurosurgery;  Laterality: N/A;  . CERVICAL LAMINECTOMY  1997   C 4 and C 5  . CHOLECYSTECTOMY N/A 04/15/2014   Procedure: LAPAROSCOPIC CHOLECYSTECTOMY WITH INTRAOPERATIVE CHOLANGIOGRAM;  Surgeon: Armandina Gemma, MD;  Location: [facility];  Service: General;  Laterality: N/A;  . COLONOSCOPY    . CORONARY BALLOON ANGIOPLASTY N/A 02/26/2019   Procedure: CORONARY BALLOON ANGIOPLASTY;  Surgeon: Leonie Man, MD;  Location: [facility];  Service: Cardiovascular -> scoring balloon PTCA (Wolverine 2.0 mm) of ostial D2 85% reducing to 20-30%.  . CORONARY STENT INTERVENTION N/A 02/26/2019    Procedure: CORONARY STENT INTERVENTION;  Surgeon: Leonie Man, MD;  Location: [facility];; PCI ostial RPDA (initial attempt of PTCA only led to small local tear/dissection covered with stent) Resolute Onyx 2.5 mm x 15 mm (2.6 mm  . CORONARY STENT INTERVENTION N/A 07/05/2019   Procedure: CORONARY STENT INTERVENTION;  Surgeon: Leonie Man, MD;  Location: [facility];  Service: Cardiovascular;; ostial D1 95% (restenosis of PTCA site) -> DES PCI RESOLUTE ONYX 2.25 mm x 50 mm (2.6 mm) positioned to avoid the true ostium.   . ESOPHAGOGASTRODUODENOSCOPY N/A 03/13/2014   Procedure: ESOPHAGOGASTRODUODENOSCOPY (EGD);  Surgeon: Wonda Horner, MD;  Location: [facility];  Service: Endoscopy;  Laterality: N/A;  . INTRAVASCULAR PRESSURE WIRE/FFR STUDY N/A 02/26/2019   Procedure: INTRAVASCULAR PRESSURE WIRE/FFR STUDY;  Surgeon: Leonie Man, MD;  Location: [facility];  Service: Cardiovascular;; mCx ~65% - DFR 0.92, FR 0.82 - BORDERLINE--> MED Rx  . LEFT HEART CATH AND CORONARY ANGIOGRAPHY N/A 02/26/2019   Procedure: LEFT HEART CATH AND CORONARY ANGIOGRAPHY;  Surgeon: Leonie Man, MD;  Location: [facility];  EF 55-65%. mCx 65% (FFR 0.82 - Med Rx). D2 85% - Wolverine Scoring Balloon PTCA (2.0 mm). Ost rPDA - 90% -Resolute Onyx 2.5 x 15 (2.6 mm).   . LEFT HEART CATH AND CORONARY ANGIOGRAPHY N/A 07/05/2019   Procedure: LEFT HEART CATH AND CORONARY ANGIOGRAPHY;  Surgeon: Leonie Man, MD;  Location: [facility];; ostial D1 95% (restenosis of PTCA site) -> DES PCI.  PDA stent widely patent.  Mid CX stable 65% lesion.  Proximal RCA 40%.  Mid RCA 45%.  Normal EDP.  [name] 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  . SHOULDER ARTHROSCOPY Bilateral   . SHOULDER ARTHROSCOPY W/ ROTATOR CUFF REPAIR Bilateral 2004-2009   left 2004, right 2009  . VASECTOMY    . WISDOM TOOTH EXTRACTION     Social History   Occupational History  . Not on file  Tobacco Use    . Smoking status: Former Smoker  Packs/day: 1.00    Years: 5.00    Pack years: 5.00    Types: Cigarettes    Quit date: 10/17/1968    Years since quitting: 51.0  . Smokeless tobacco: Never Used  Substance and Sexual Activity  . Alcohol use: Not Currently    Comment: "quit drinking ~ 10/1968  . Drug use: No  . Sexual activity: Not on file

## 2019-11-04 ENCOUNTER — Other Ambulatory Visit: Payer: Self-pay

## 2019-11-05 ENCOUNTER — Telehealth: Payer: Self-pay | Admitting: Family Medicine

## 2019-11-05 ENCOUNTER — Other Ambulatory Visit (INDEPENDENT_AMBULATORY_CARE_PROVIDER_SITE_OTHER): Payer: Medicare Other

## 2019-11-05 DIAGNOSIS — R972 Elevated prostate specific antigen [PSA]: Secondary | ICD-10-CM | POA: Diagnosis not present

## 2019-11-05 LAB — PSA: PSA: 5.55 ng/mL — ABNORMAL HIGH (ref 0.10–4.00)

## 2019-11-05 NOTE — Telephone Encounter (Signed)
Pt would like his urology referral sent to Dr. Alinda Money at [facility].    [facility]. [place] [phone]

## 2019-11-05 NOTE — Telephone Encounter (Signed)
fyi

## 2019-11-08 NOTE — Telephone Encounter (Signed)
[facility]  referral placed on [facility]  Address: [address], [place], [place] [address] Phone: [phone] Fax [phone] Their office will contact pt to schedule directly and inform our office of appointment

## 2020-01-01 DIAGNOSIS — R972 Elevated prostate specific antigen [PSA]: Secondary | ICD-10-CM | POA: Diagnosis not present

## 2020-01-06 ENCOUNTER — Other Ambulatory Visit: Payer: Self-pay | Admitting: Urology

## 2020-01-06 DIAGNOSIS — R972 Elevated prostate specific antigen [PSA]: Secondary | ICD-10-CM

## 2020-01-23 DIAGNOSIS — H401121 Primary open-angle glaucoma, left eye, mild stage: Secondary | ICD-10-CM | POA: Diagnosis not present

## 2020-01-31 ENCOUNTER — Telehealth: Payer: Self-pay | Admitting: Cardiology

## 2020-01-31 NOTE — Telephone Encounter (Signed)
   [facility] Pre-operative Risk Assessment    HEARTCARE STAFF: - Please ensure there is not already an duplicate clearance open for this procedure. - Under Visit Info/Reason for Call, type in Other and utilize the format Clearance MM/DD/YY or Clearance TBD. Do not use dashes or single digits. - If request is for dental extraction, please clarify the # of teeth to be extracted.  Request for surgical clearance:  1. What type of surgery is being performed? Left total knee arthoplasty    2. When is this surgery scheduled? 03/04/2020   3. What type of clearance is required (medical clearance vs. Pharmacy clearance to hold med vs. Both)? Both   4. Are there any medications that need to be held prior to surgery and how long? Plavix/ASA   5. Practice name and name of physician performing surgery? Dr. Hector Shade with [facility]   6. What is the office phone number? [phone]   7.   What is the office fax number? [phone] Attn: Glendale Chard  8.   Anesthesia type (None, local, MAC, general) ? Choice   Sheral Apley M 01/31/2020, 3:40 PM  _________________________________________________________________   (provider comments below)

## 2020-01-31 NOTE — Telephone Encounter (Signed)
   Primary Cardiologist: Glenetta Hew, MD  Chart reviewed as part of pre-operative protocol coverage. Patient was contacted 01/31/2020 in reference to pre-operative risk assessment for pending surgery as outlined below. Dr. Ellyn Hack has outlined a plan to hold plavix.   Pt has an appt with Dr. Ellyn Hack on 02/13/20 and wants to discuss surgery with Dr. Ellyn Hack as well as holding antiplatelets. Will defer to Dr. Allison Quarry recommendation.  Please add preop clearance to the appt notes.  I will route this recommendation to the requesting party via Epic fax function and remove from pre-op pool. Please call with questions.  Ledora Bottcher, PA 01/31/2020, 5:33 PM

## 2020-02-03 ENCOUNTER — Telehealth: Payer: Self-pay

## 2020-02-03 ENCOUNTER — Telehealth: Payer: Self-pay | Admitting: Family Medicine

## 2020-02-03 NOTE — Telephone Encounter (Signed)
FYI:  While pt was on the phone he wanted to let Dr. Elease Hashimoto know that his appointment with Dr. Alinda Money in May that his PSA has gone up more from the visit with Dr. Elease Hashimoto in March and they have set him up to have a MRI on Saturday 02/08/2020.  He will be having hip surgery on 7/21 if the MRI results for his prostate is good.  He also has an appointment for surgical clearance on 02/13/2020 with the Cardiologist Dr. Ellyn Hack.

## 2020-02-03 NOTE — Telephone Encounter (Signed)
Fyi.

## 2020-02-03 NOTE — Telephone Encounter (Signed)
noted 

## 2020-02-03 NOTE — Telephone Encounter (Signed)
Pt needs a surgical clearance appt

## 2020-02-03 NOTE — Telephone Encounter (Signed)
lvm for pt to call back pt needs surgical clearance appt

## 2020-02-03 NOTE — Telephone Encounter (Signed)
Will forward clearance notes to MD for upcoming appt. I will send FYI to surgeon Dr. Gaynelle Arabian pt has appt 02/24/20 with cardiologist. I will remove from the pre op call back pool.

## 2020-02-03 NOTE — Telephone Encounter (Signed)
[place] pt returned your call

## 2020-02-08 ENCOUNTER — Other Ambulatory Visit: Payer: Self-pay

## 2020-02-08 ENCOUNTER — Ambulatory Visit
Admission: RE | Admit: 2020-02-08 | Discharge: 2020-02-08 | Disposition: A | Discharge status: Home / Self Care | Payer: Medicare Other | Source: Ambulatory Visit | Attending: Urology | Admitting: Urology

## 2020-02-08 DIAGNOSIS — R972 Elevated prostate specific antigen [PSA]: Secondary | ICD-10-CM

## 2020-02-08 MED ORDER — GADOBENATE DIMEGLUMINE 529 MG/ML IV SOLN
15.0000 mL | Freq: Once | INTRAVENOUS | Status: AC | PRN
  Administered 2020-02-08: 15 mL via INTRAVENOUS

## 2020-02-13 ENCOUNTER — Other Ambulatory Visit: Payer: Self-pay | Admitting: Physician Assistant

## 2020-02-13 ENCOUNTER — Encounter: Payer: Self-pay | Admitting: Cardiology

## 2020-02-13 ENCOUNTER — Other Ambulatory Visit: Payer: Self-pay

## 2020-02-13 ENCOUNTER — Ambulatory Visit (INDEPENDENT_AMBULATORY_CARE_PROVIDER_SITE_OTHER): Payer: Medicare Other | Admitting: Cardiology

## 2020-02-13 VITALS — BP 134/70 | HR 76 | Temp 97.2°F | Ht 72.0 in | Wt 192.8 lb

## 2020-02-13 DIAGNOSIS — R0600 Dyspnea, unspecified: Secondary | ICD-10-CM | POA: Insufficient documentation

## 2020-02-13 DIAGNOSIS — I10 Essential (primary) hypertension: Secondary | ICD-10-CM

## 2020-02-13 DIAGNOSIS — E785 Hyperlipidemia, unspecified: Secondary | ICD-10-CM | POA: Diagnosis not present

## 2020-02-13 DIAGNOSIS — I2510 Atherosclerotic heart disease of native coronary artery without angina pectoris: Secondary | ICD-10-CM

## 2020-02-13 DIAGNOSIS — R5383 Other fatigue: Secondary | ICD-10-CM | POA: Diagnosis not present

## 2020-02-13 DIAGNOSIS — Z01810 Encounter for preprocedural cardiovascular examination: Secondary | ICD-10-CM

## 2020-02-13 DIAGNOSIS — Z9861 Coronary angioplasty status: Secondary | ICD-10-CM

## 2020-02-13 DIAGNOSIS — I25119 Atherosclerotic heart disease of native coronary artery with unspecified angina pectoris: Secondary | ICD-10-CM

## 2020-02-13 DIAGNOSIS — R0609 Other forms of dyspnea: Secondary | ICD-10-CM | POA: Insufficient documentation

## 2020-02-13 HISTORY — PX: TRANSTHORACIC ECHOCARDIOGRAM: SHX275

## 2020-02-13 MED ORDER — HYDROCHLOROTHIAZIDE 25 MG PO TABS
25.0000 mg | ORAL_TABLET | Freq: Every day | ORAL | 3 refills | Status: DC
Start: 2020-02-13 — End: 2020-05-25

## 2020-02-13 NOTE — Assessment & Plan Note (Signed)
Remains on Repatha.  LDL levels have looked good.  Due for follow-up labs by PCP in September.

## 2020-02-13 NOTE — Assessment & Plan Note (Signed)
Probably related to fatigue.  Would be unusual to have new symptoms of exertional dyspnea with increase in angina.  Not likely be coronary disease in nature.  Plan: Check 2D echocardiogram for baseline evaluation

## 2020-02-13 NOTE — Progress Notes (Signed)
Primary Care Provider: Eulas Post, MD Cardiologist: Glenetta Hew, MD Electrophysiologist: None  Orthopedic surgeon: Dr. Wynelle Link  Clinic Note: Chief Complaint  Patient presents with  . Shortness of Breath    Exertional with fatigue  . Pre-op Exam  . Coronary Artery Disease    No angina    HPI:    Luis Guzman is a 74 y.o. male with a PMH notable for CAD-PCI to first the PDA followed by diagonal after failed attempted PTCA only.  He presents today for routine 36-month follow-up preop evaluation for hip surgery (total hip arthroplasty).  Luis Guzman was last seen on October 17, 2018 via telemedicine.  He is doing well from a cardiac standpoint.  Limited mostly by his hip pain.  He had one episode of chest discomfort that occurred at rest but not with exertion.  Was noting that the hip injections are not lasting as long. --> Noted that was okay for him to hold his platelet agents for upcoming hip injections desire to wait until 6 months post PCI, but otherwise intended to continue on Plavix until November 2021.  Recent Hospitalizations: None  Reviewed  CV studies:    The following studies were reviewed today: (if available, images/films reviewed: From Epic Chart or Care Everywhere) . None:  Interval History:   Luis Guzman returns today partly for preop evaluation for upcoming surgery (total hip arthroplasty on July 21).  He has not had any further chest pain since his last PCI, still has exertional fatigue with some dyspnea.  He says that taking the trash out makes him tired and short of breath.  He can still mow his lawn, but in intervals.  He otherwise does not have any heart failure symptoms of PND, orthopnea or edema.  No rapid heartbeats palpitations.  No syncope/near syncope or TIA/amaurosis fugax.  Not really walking enough to get claudication.  The patient does not have symptoms concerning for COVID-19 infection (fever, chills, cough, or new shortness of breath).  The  patient is practicing social distancing & Masking.  COVID-19 vaccine: Pfizer-January and February   REVIEWED OF SYSTEMS   Review of Systems  Constitutional: Positive for malaise/fatigue.  HENT: Negative for congestion and nosebleeds.   Respiratory: Positive for shortness of breath. Negative for cough.   Cardiovascular: Negative for leg swelling.  Gastrointestinal: Negative for blood in stool and melena.  Genitourinary: Negative for hematuria.  Musculoskeletal: Positive for joint pain (Left worse than right hip pain).  Neurological: Negative for dizziness and headaches.  Endo/Heme/Allergies: Does not bruise/bleed easily.  Psychiatric/Behavioral: Negative for memory loss. The patient is not nervous/anxious and does not have insomnia.    Tired at the end of the day, left hip pain.  Than right.  Bruising  I have reviewed and (if needed) personally updated the patient's problem list, medications, allergies, past medical and surgical history, social and family history.   PAST MEDICAL HISTORY   Past Medical History:  Diagnosis Date  . CAD S/P PTCA & DES PCI - for Progressive Angina 02/26/2019   Cath-PCI 02/26/2019: EF 55-65%. mCx 65% (FFR 0.82 - Med Rx). D2 85% - Wolverine Scoring Balloon PTCA (2.0 mm). Ost rPDA - 90% -Resolute Onyx 2.5 x 15 (2.6 mm). --> 06/2019: staged DES PCI ost D1 (RESOLUTE ONYX 2.25 mm x 50 mm (2.6 mm) positioned to avoid the true ostium)  . GERD (gastroesophageal reflux disease)   . History of hiatal hernia    40 yrs ago  .  HYPERLIPIDEMIA 01/26/2009  . HYPERTENSION 01/26/2009  . 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 lower back pain, s/p cervical neck surgery"stiffness" remains  . Transfusion history    infant "anemia"    PAST SURGICAL HISTORY   Past Surgical History:  Procedure Laterality Date  . APPENDECTOMY  ~ 1959  . BACK SURGERY    . CERVICAL DISC ARTHROPLASTY N/A 07/30/2018   Procedure: Cervical five-six Cervical six-seven  Artificial disc replacement;  Surgeon: Kristeen Miss, MD;  Location: [facility];  Service: Neurosurgery;  Laterality: N/A;  . CERVICAL LAMINECTOMY  1997   C 4 and C 5  . CHOLECYSTECTOMY N/A 04/15/2014   Procedure: LAPAROSCOPIC CHOLECYSTECTOMY WITH INTRAOPERATIVE CHOLANGIOGRAM;  Surgeon: Armandina Gemma, MD;  Location: [facility];  Service: General;  Laterality: N/A;  . COLONOSCOPY    . CORONARY BALLOON ANGIOPLASTY N/A 02/26/2019   Procedure: CORONARY BALLOON ANGIOPLASTY;  Surgeon: Leonie Man, MD;  Location: [facility];  Service: Cardiovascular -> scoring balloon PTCA (Wolverine 2.0 mm) of ostial D2 85% reducing to 20-30%.  . CORONARY STENT INTERVENTION N/A 02/26/2019   Procedure: CORONARY STENT INTERVENTION;  Surgeon: Leonie Man, MD;  Location: [facility];; PCI ostial RPDA (initial attempt of PTCA only led to small local tear/dissection covered with stent) Resolute Onyx 2.5 mm x 15 mm (2.6 mm  . CORONARY STENT INTERVENTION N/A 07/05/2019   Procedure: CORONARY STENT INTERVENTION;  Surgeon: Leonie Man, MD;  Location: [facility];  Service: Cardiovascular;; ostial D1 95% (restenosis of PTCA site) -> DES PCI RESOLUTE ONYX 2.25 mm x 50 mm (2.6 mm) positioned to avoid the true ostium.   . ESOPHAGOGASTRODUODENOSCOPY N/A 03/13/2014   Procedure: ESOPHAGOGASTRODUODENOSCOPY (EGD);  Surgeon: Wonda Horner, MD;  Location: [facility];  Service: Endoscopy;  Laterality: N/A;  . INTRAVASCULAR PRESSURE WIRE/FFR STUDY N/A 02/26/2019   Procedure: INTRAVASCULAR PRESSURE WIRE/FFR STUDY;  Surgeon: Leonie Man, MD;  Location: [facility];  Service: Cardiovascular;; mCx ~65% - DFR 0.92, FR 0.82 - BORDERLINE--> MED Rx  . LEFT HEART CATH AND CORONARY ANGIOGRAPHY N/A 02/26/2019   Procedure: LEFT HEART CATH AND CORONARY ANGIOGRAPHY;  Surgeon: Leonie Man, MD;  Location: [place] [facility];  EF 55-65%. mCx 65% (FFR 0.82 - Med Rx). D2 85% - Wolverine Scoring Balloon PTCA (2.0 mm). Ost rPDA  - 90% -Resolute Onyx 2.5 x 15 (2.6 mm).   . LEFT HEART CATH AND CORONARY ANGIOGRAPHY N/A 07/05/2019   Procedure: LEFT HEART CATH AND CORONARY ANGIOGRAPHY;  Surgeon: Leonie Man, MD;  Location: [facility];; ostial D1 95% (restenosis of PTCA site) -> DES PCI.  PDA stent widely patent.  Mid CX stable 65% lesion.  Proximal RCA 40%.  Mid RCA 45%.  Normal EDP.  [name] 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  . SHOULDER ARTHROSCOPY Bilateral   . SHOULDER ARTHROSCOPY W/ ROTATOR CUFF REPAIR Bilateral 2004-2009   left 2004, right 2009  . VASECTOMY    . WISDOM TOOTH EXTRACTION      Cath- PCI 07/05/2019: ostial D1 95% (restenosis of PTCA site) -> DES PCI RESOLUTE ONYX 2.25 mm x 50 mm (2.6 mm) positioned to avoid the true ostium.  PDA stent widely patent.  Mid CX stable 65% lesion.  Proximal RCA 40%.  Mid RCA 45%. Normal EDP.    MEDICATIONS/ALLERGIES   Current Meds  Medication Sig  . aspirin EC 81 MG tablet Take 1 tablet (81 mg total) by  mouth every evening. (Patient taking differently: Take 81 mg by mouth at bedtime. )  . baclofen (LIORESAL) 10 MG tablet Take 10 mg by mouth daily as needed for muscle spasms.  . bimatoprost (LUMIGAN) 0.03 % ophthalmic solution Place 1 drop into both eyes at bedtime.  . Brinzolamide-Brimonidine (SIMBRINZA) 1-0.2 % SUSP Place 1 drop into both eyes 2 (two) times daily.   . Evolocumab (REPATHA SURECLICK) 751 MG/ML SOAJ Inject 140 mg into the skin every 14 (fourteen) days.  . hydrochlorothiazide (MICROZIDE) 12.5 MG capsule Take 12.5 mg by mouth daily.  [name] HYDROCORTISONE EX Apply 1 application topically 2 (two) times daily as needed (insect bites/itching).   . isosorbide mononitrate (IMDUR) 30 MG 24 hr tablet Take 1 tablet (30 mg total) by mouth daily.  . metoprolol succinate (TOPROL XL) 25 MG 24 hr tablet Take 0.5 tablets (12.5 mg total) by mouth daily.  . nitroGLYCERIN (NITROSTAT) 0.4 MG SL tablet Place 1 tablet (0.4 mg total) under the tongue  every 5 (five) minutes as needed for chest pain.  . potassium chloride SA (KLOR-CON) 20 MEQ tablet Take 1 tablet (20 mEq total) by mouth as needed.  . valsartan (DIOVAN) 320 MG tablet Take 160 mg by mouth daily.  . [DISCONTINUED] clopidogrel (PLAVIX) 75 MG tablet Take 1 tablet (75 mg total) by mouth daily.    Allergies  Allergen Reactions  . Penicillins Itching    Has patient had a PCN reaction causing immediate rash, facial/tongue/throat swelling, SOB or lightheadedness with hypotension: no Has patient had a PCN reaction causing severe rash involving mucus membranes or skin necrosis: unkn Has patient had a PCN reaction that required hospitalization: no Has patient had a PCN reaction occurring within the last 10 years: no If all of the above answers are "NO", then may proceed with Cephalosporin use.     SOCIAL HISTORY/FAMILY HISTORY   Reviewed in Epic:  Pertinent findings: no new changes  OBJCTIVE -PE, EKG, labs   Wt Readings from Last 3 Encounters:  02/13/20 192 lb 12.8 oz (87.5 kg)  10/30/19 185 lb (83.9 kg)  10/17/19 185 lb (83.9 kg)    Physical Exam: BP 134/70   Pulse 76   Temp (!) 97.2 F (36.2 C)   Ht 6' (1.829 m)   Wt 192 lb 12.8 oz (87.5 kg)   SpO2 96%   BMI 26.15 kg/m  Physical Exam Constitutional:      General: He is not in acute distress.    Appearance: Normal appearance. He is normal weight.  HENT:     Head: Normocephalic and atraumatic.  Neck:     Vascular: Normal carotid pulses. No carotid bruit, hepatojugular reflux or JVD.  Cardiovascular:     Rate and Rhythm: Normal rate and regular rhythm.  No extrasystoles are present.    Chest Wall: PMI is not displaced.     Pulses: Normal pulses.     Heart sounds: Normal heart sounds. No murmur heard.  No friction rub. No gallop.   Pulmonary:     Effort: Pulmonary effort is normal. No respiratory distress.     Breath sounds: Normal breath sounds. No wheezing, rhonchi or rales.  Musculoskeletal:         General: No swelling or tenderness. Normal range of motion.     Cervical back: Normal range of motion and neck supple.  Neurological:     General: No focal deficit present.     Mental Status: He is alert and oriented to person, place, and  time.  Psychiatric:        Mood and Affect: Mood normal.        Behavior: Behavior normal.        Thought Content: Thought content normal.        Judgment: Judgment normal.     Adult ECG Report  Rate: 76;  Rhythm: normal sinus rhythm; normal axis, normal durations  Narrative Interpretation: Normal EKG  Recent Labs:    Lab Results  Component Value Date   CHOL 110 05/06/2019   HDL 37.40 (L) 05/06/2019   LDLCALC 53 05/06/2019   LDLDIRECT 129.0 08/19/2016   TRIG 101.0 05/06/2019   CHOLHDL 3 05/06/2019   Lab Results  Component Value Date   CREATININE 0.84 08/26/2019   BUN 12 08/26/2019   NA 141 08/26/2019   K 4.2 08/26/2019   CL 102 08/26/2019   CO2 24 08/26/2019   Lab Results  Component Value Date   TSH 0.61 05/06/2019    ASSESSMENT/PLAN    Problem List Items Addressed This Visit    CAD S/P & DES PCI -ostial PDA and D1 (Chronic)    He is a year out from his PDA PCI and now 8 months out from his diagonal PCI.   Should be okay to hold aspirin and Plavix 7 days preop (based on [place] data with new generation DES stents).   Hold Plavix 7 days preop and restart 2 to 3 days postop  Hold aspirin as well and restart accordingly.   For upcoming prostate biopsy, would hold Plavix 5 days and restart postop day 1.  If able, continue aspirin.      Relevant Medications   hydrochlorothiazide (HYDRODIURIL) 25 MG tablet   Other Relevant Orders   EKG 12-Lead (Completed)   ECHOCARDIOGRAM COMPLETE (Completed)   Coronary artery disease involving native coronary artery of native heart with angina pectoris (HCC) (Chronic)    Status post two-vessel PCI first to the PDA followed by the diagonal branch.  His presenting symptom that time was  clearly anginal chest pain.  Has done well with no further angina post PCI of the diagonal.  He still has some fatigue with exertion and exertional dyspnea which could simply be deconditioning from his hip.  He is not having classic anginal type symptoms like he did before.  He has tolerated holding Plavix for hip joint injections, and now is more than 6 months post PCI and would be okay to hold Plavix for prolonged period of time for his hip surgery.  Currently on Toprol 12.5 mg daily which I would like to keep on board at least perioperatively for his hip surgery for cardiac protection, however with his fatigue I think we can stop it after that-if not we will be weaning to every other day for 2 weeks and then stopping.  He has statin intolerance, but is on Repatha. Continue ARB  He does still have the borderline lesion in the LCx-OM.  In the future would probably want to do a screening stress test if he is not able to get fully active again after hip surgeries.  I do think his exertional dyspnea fatigue is probably partially related to deconditioning, and less related to his LCx-OM lesion that was nonsignificant by FFR.  Would be unusual to have a change in symptoms from anginal chest pain to simply fatigue and dyspnea.      Relevant Medications   hydrochlorothiazide (HYDRODIURIL) 25 MG tablet   Other Relevant Orders   EKG 12-Lead (Completed)  ECHOCARDIOGRAM COMPLETE (Completed)   Hyperlipidemia with target LDL less than 70 - statin intolerant (Chronic)    Remains on Repatha.  LDL levels have looked good.  Due for follow-up labs by PCP in September.      Relevant Medications   hydrochlorothiazide (HYDRODIURIL) 25 MG tablet   Essential hypertension (Chronic)    Blood pressure is borderline here on Toprol, ARB and Imdur along with HCTZ.  I want to continue Toprol perioperatively, but will stop beginning 2 weeks postop.  Will wean off over 2 weeks and increase HCTZ to 25 mg daily.  In the  past we have tried amlodipine, but his blood pressure dropped too low.  This could still be an option at lower dose in the future.      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  I want to continue Toprol perioperatively, but can discontinue postop.  Checking 2D echocardiogram simply to ensure no hidden abnormalities.      Relevant Orders   EKG 12-Lead (Completed)   ECHOCARDIOGRAM COMPLETE (Completed)   Preop cardiovascular exam - Primary    Luis Guzman is a relatively active gentleman who prior to being limited by his hip pains and had a easily been able to achieve more than 4 METS.  Now he does have deconditioning from lack of activity, but is not having any further angina post PCI.  He does have exertional fatigue which is probably from deconditioning.  He does not have diabetes, has normal renal function, no stroke history and no CHF.  Hip surgery would be considered intermediate risk surgery.  He would be considered class I to class II risk with roughly 3 to 6% risk of adverse cardiac event.  This is mitigated partially by being on Imdur.  For that reason I would like to continue Imdur perioperatively.  We will check a 2D echocardiogram because of a sense of dyspnea and he has yet to have one checked.  There is also concern for history of aging or exposure.  EF by LV gram at the time of cath was normal and LVEDP was normal, so would hope that his echo was normal.  If that is the case then he should be LV fine proceeding with surgery.      Relevant Orders   EKG 12-Lead (Completed)   ECHOCARDIOGRAM COMPLETE (Completed)   DOE (dyspnea on exertion)    Probably related to fatigue.  Would be unusual to have new symptoms of exertional dyspnea with increase in angina.  Not likely be coronary disease in nature.  Plan: Check 2D echocardiogram for  baseline evaluation      Relevant Orders   EKG 12-Lead (Completed)   ECHOCARDIOGRAM COMPLETE (Completed)       COVID-19 Education: The signs and symptoms of COVID-19 were discussed with the patient and how to seek care for testing (follow up with PCP or arrange E-visit).   The importance of social distancing and COVID-19 vaccination was discussed today.  I spent a total of 68minutes with the patient. >  50% of the time was spent in direct patient consultation.  Additional time spent with chart review  / charting (studies, outside notes, etc): 6 Total Time: 24 min   Current medicines are reviewed at length with the patient today.  (+/- concerns) none none none  Notice: This dictation was prepared with  Dragon dictation along with smaller Company secretary. Any transcriptional errors that result from this process are unintentional and may not be corrected upon review.  Patient Instructions / Medication Changes & Studies & Tests Ordered   Patient Instructions  Medication Instructions:  No changes  Except :  instructions after  Hip surgery  -2 weeks after surgery  - Decrease Toprol Xl  12.5 mg to every other day for 2 weeks then Stop taking Toprol XL .  Increase  HCTZ 25 mg daily  Starting 2 weeks after surgery. *If you need a refill on your cardiac medications before your next appointment, please call your pharmacy*   Lab Work: Not needed   Testing/Procedures:  will be schedule at [place] has requested that you have an echocardiogram. Echocardiography is a painless test that uses sound waves to create images of your heart. It provides your doctor with information about the size and shape of your heart and how well your heart's chambers and valves are working. This procedure takes approximately one hour. There are no restrictions for this procedure.    Follow-Up: At [facility], you and your health needs are our priority.  As part of  our continuing mission to provide you with exceptional heart care, we have created designated Provider Care Teams.  These Care Teams include your primary Cardiologist (physician) and Advanced Practice Providers (APPs -  Physician Assistants and Nurse Practitioners) who all work together to provide you with the care you need, when you need it.    Your next appointment:   3 month(s)  The format for your next appointment:   In Person  Provider:   Glenetta Hew, MD   Other Instructions  Clearance for hip surgery 03/04/20 --  Hold  Plavix 7 days prior to hip surgery and restart  2-3 days post  Surgery.  clearance  For  Prostate  biospy-- hold for 5 days prior to biopsy and restart the next day     Studies Ordered:   Orders Placed This Encounter  Procedures  . EKG 12-Lead  . ECHOCARDIOGRAM COMPLETE     Glenetta Hew, M.D., M.S. Interventional Cardiologist   Pager # [phone] Phone # [phone] [address] [place], [place] [address]   Thank you for choosing Heartcare at [facility]!!

## 2020-02-13 NOTE — Patient Instructions (Addendum)
Medication Instructions:  No changes  Except :  instructions after  Hip surgery  -2 weeks after surgery  - Decrease Toprol Xl  12.5 mg to every other day for 2 weeks then Stop taking Toprol XL .  Increase  HCTZ 25 mg daily  Starting 2 weeks after surgery. *If you need a refill on your cardiac medications before your next appointment, please call your pharmacy*   Lab Work: Not needed   Testing/Procedures:  will be schedule at [facility] has requested that you have an echocardiogram. Echocardiography is a painless test that uses sound waves to create images of your heart. It provides your doctor with information about the size and shape of your heart and how well your heart's chambers and valves are working. This procedure takes approximately one hour. There are no restrictions for this procedure.    Follow-Up: At [facility], you and your health needs are our priority.  As part of our continuing mission to provide you with exceptional heart care, we have created designated Provider Care Teams.  These Care Teams include your primary Cardiologist (physician) and Advanced Practice Providers (APPs -  Physician Assistants and Nurse Practitioners) who all work together to provide you with the care you need, when you need it.    Your next appointment:   3 month(s)  The format for your next appointment:   In Person  Provider:   Glenetta Hew, MD   Other Instructions  Clearance for hip surgery 03/04/20 --  Hold  Plavix 7 days prior to hip surgery and restart  2-3 days post  Surgery.  clearance  For  Prostate  biospy-- hold for 5 days prior to biopsy and restart the next day

## 2020-02-13 NOTE — Assessment & Plan Note (Signed)
Blood pressure is borderline here on Toprol, ARB and Imdur along with HCTZ.  I want to continue Toprol perioperatively, but will stop beginning 2 weeks postop.  Will wean off over 2 weeks and increase HCTZ to 25 mg daily.  In the past we have tried amlodipine, but his blood pressure dropped too low.  This could still be an option at lower dose in the future.

## 2020-02-13 NOTE — Assessment & Plan Note (Signed)
He tended to have some improvement with reducing his Toprol dose in the past.  I want to continue Toprol perioperatively, but can discontinue postop.  Checking 2D echocardiogram simply to ensure no hidden abnormalities.

## 2020-02-13 NOTE — Assessment & Plan Note (Addendum)
He is a year out from his PDA PCI and now 8 months out from his diagonal PCI.   Should be okay to hold aspirin and Plavix 7 days preop (based on [place] data with new generation DES stents).   Hold Plavix 7 days preop and restart 2 to 3 days postop  Hold aspirin as well and restart accordingly.   For upcoming prostate biopsy, would hold Plavix 5 days and restart postop day 1.  If able, continue aspirin.

## 2020-02-13 NOTE — Assessment & Plan Note (Signed)
Status post two-vessel PCI first to the PDA followed by the diagonal branch.  His presenting symptom that time was clearly anginal chest pain.  Has done well with no further angina post PCI of the diagonal.  He still has some fatigue with exertion and exertional dyspnea which could simply be deconditioning from his hip.  He is not having classic anginal type symptoms like he did before.  He has tolerated holding Plavix for hip joint injections, and now is more than 6 months post PCI and would be okay to hold Plavix for prolonged period of time for his hip surgery.  Currently on Toprol 12.5 mg daily which I would like to keep on board at least perioperatively for his hip surgery for cardiac protection, however with his fatigue I think we can stop it after that-if not we will be weaning to every other day for 2 weeks and then stopping.  He has statin intolerance, but is on Repatha. Continue ARB  He does still have the borderline lesion in the LCx-OM.  In the future would probably want to do a screening stress test if he is not able to get fully active again after hip surgeries.  I do think his exertional dyspnea fatigue is probably partially related to deconditioning, and less related to his LCx-OM lesion that was nonsignificant by FFR.  Would be unusual to have a change in symptoms from anginal chest pain to simply fatigue and dyspnea.

## 2020-02-13 NOTE — Assessment & Plan Note (Signed)
Luis Guzman is a relatively active gentleman who prior to being limited by his hip pains and had a easily been able to achieve more than 4 METS.  Now he does have deconditioning from lack of activity, but is not having any further angina post PCI.  He does have exertional fatigue which is probably from deconditioning.  He does not have diabetes, has normal renal function, no stroke history and no CHF.  Hip surgery would be considered intermediate risk surgery.  He would be considered class I to class II risk with roughly 3 to 6% risk of adverse cardiac event.  This is mitigated partially by being on Imdur.  For that reason I would like to continue Imdur perioperatively.  We will check a 2D echocardiogram because of a sense of dyspnea and he has yet to have one checked.  There is also concern for history of aging or exposure.  EF by LV gram at the time of cath was normal and LVEDP was normal, so would hope that his echo was normal.  If that is the case then he should be LV fine proceeding with surgery.

## 2020-02-14 ENCOUNTER — Ambulatory Visit (HOSPITAL_COMMUNITY)
Admission: RE | Admit: 2020-02-14 | Discharge: 2020-02-14 | Disposition: A | Discharge status: Home / Self Care | Payer: Medicare Other | Source: Ambulatory Visit | Attending: Cardiology | Admitting: Cardiology

## 2020-02-14 DIAGNOSIS — E785 Hyperlipidemia, unspecified: Secondary | ICD-10-CM | POA: Diagnosis not present

## 2020-02-14 DIAGNOSIS — I25119 Atherosclerotic heart disease of native coronary artery with unspecified angina pectoris: Secondary | ICD-10-CM | POA: Diagnosis not present

## 2020-02-14 DIAGNOSIS — I714 Abdominal aortic aneurysm, without rupture: Secondary | ICD-10-CM | POA: Insufficient documentation

## 2020-02-14 DIAGNOSIS — R5383 Other fatigue: Secondary | ICD-10-CM | POA: Diagnosis not present

## 2020-02-14 DIAGNOSIS — Z9861 Coronary angioplasty status: Secondary | ICD-10-CM

## 2020-02-14 DIAGNOSIS — R0600 Dyspnea, unspecified: Secondary | ICD-10-CM | POA: Diagnosis not present

## 2020-02-14 DIAGNOSIS — Z01810 Encounter for preprocedural cardiovascular examination: Secondary | ICD-10-CM | POA: Diagnosis not present

## 2020-02-14 DIAGNOSIS — I2510 Atherosclerotic heart disease of native coronary artery without angina pectoris: Secondary | ICD-10-CM

## 2020-02-14 DIAGNOSIS — I351 Nonrheumatic aortic (valve) insufficiency: Secondary | ICD-10-CM | POA: Insufficient documentation

## 2020-02-14 DIAGNOSIS — I10 Essential (primary) hypertension: Secondary | ICD-10-CM | POA: Insufficient documentation

## 2020-02-14 DIAGNOSIS — R0609 Other forms of dyspnea: Secondary | ICD-10-CM

## 2020-02-14 DIAGNOSIS — Z87891 Personal history of nicotine dependence: Secondary | ICD-10-CM | POA: Diagnosis not present

## 2020-02-14 NOTE — Telephone Encounter (Signed)
Patient calling stating [facility] has not heard anything about the clearance.

## 2020-02-14 NOTE — Telephone Encounter (Signed)
Faxed to requesting party with recent OV attached, via Epic fax function

## 2020-02-14 NOTE — Progress Notes (Signed)
  Echocardiogram 2D Echocardiogram has been performed.  Michiel Cowboy 02/14/2020, 8:44 AM

## 2020-02-14 NOTE — Telephone Encounter (Signed)
This is a NL pt, Dr. Harding.  

## 2020-02-16 ENCOUNTER — Encounter: Payer: Self-pay | Admitting: Cardiology

## 2020-02-19 ENCOUNTER — Other Ambulatory Visit: Payer: Self-pay

## 2020-02-19 ENCOUNTER — Ambulatory Visit (INDEPENDENT_AMBULATORY_CARE_PROVIDER_SITE_OTHER): Payer: Medicare Other | Admitting: Family Medicine

## 2020-02-19 VITALS — BP 124/70 | HR 65 | Temp 97.6°F | Wt 192.8 lb

## 2020-02-19 DIAGNOSIS — I2510 Atherosclerotic heart disease of native coronary artery without angina pectoris: Secondary | ICD-10-CM | POA: Diagnosis not present

## 2020-02-19 DIAGNOSIS — Z01818 Encounter for other preprocedural examination: Secondary | ICD-10-CM | POA: Diagnosis not present

## 2020-02-19 DIAGNOSIS — Z9861 Coronary angioplasty status: Secondary | ICD-10-CM | POA: Diagnosis not present

## 2020-02-19 DIAGNOSIS — R5383 Other fatigue: Secondary | ICD-10-CM | POA: Diagnosis not present

## 2020-02-19 DIAGNOSIS — G4700 Insomnia, unspecified: Secondary | ICD-10-CM

## 2020-02-19 NOTE — Progress Notes (Signed)
Established Patient Office Visit  Subjective:  Patient ID: Luis Guzman, male    DOB: [birth year]  Age: 74 y.o. MRN: [id number]  CC:  Chief Complaint  Patient presents with  . Pre-op Exam    Pt is having left hip replacement on 7/21    HPI Luis Guzman presents for preoperative clearance.  Planned left total hip replacement.  His chronic problems include history of CAD, hypertension, past history of pancreatitis, abdominal aortic aneurysm.  He had recent EKG and echocardiogram per cardiology.  No acute abnormalities on echo and he was given cardiac clearance.  He has had some nonspecific malaise and in process of tapering back metoprolol per cardiology.  He does have some chronic sleep disturbance and has some difficulty falling asleep but mostly staying asleep.  No alcohol use.  Does drink some tea at night.  Has not tried any over-the-counter remedies such as melatonin.  Elevated PSA and work-up per urology.  Recent MRI scan did not show any definite tumor and at some point they are planning to do a biopsy.  No major obstructive urinary symptoms.  No recent chest pains.  No cough.  No fever. He has had Covid vaccination back in January and February. He has preoperative labs scheduled for [facility] long.  Past Medical History:  Diagnosis Date  . CAD S/P PTCA & DES PCI - for Progressive Angina 02/26/2019   Cath-PCI 02/26/2019: EF 55-65%. mCx 65% (FFR 0.82 - Med Rx). D2 85% - Wolverine Scoring Balloon PTCA (2.0 mm). Ost rPDA - 90% -Resolute Onyx 2.5 x 15 (2.6 mm). --> 06/2019: staged DES PCI ost D1 (RESOLUTE ONYX 2.25 mm x 50 mm (2.6 mm) positioned to avoid the true ostium)  . GERD (gastroesophageal reflux disease)   . History of hiatal hernia    40 yrs ago  . HYPERLIPIDEMIA 01/26/2009  . HYPERTENSION 01/26/2009  . 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 lower back pain, s/p cervical neck surgery"stiffness" remains  . Transfusion history    infant "anemia"    Past  Surgical History:  Procedure Laterality Date  . APPENDECTOMY  ~ 1959  . BACK SURGERY    . CERVICAL DISC ARTHROPLASTY N/A 07/30/2018   Procedure: Cervical five-six Cervical six-seven Artificial disc replacement;  Surgeon: Kristeen Miss, MD;  Location: [place];  Service: Neurosurgery;  Laterality: N/A;  . CERVICAL LAMINECTOMY  1997   C 4 and C 5  . CHOLECYSTECTOMY N/A 04/15/2014   Procedure: LAPAROSCOPIC CHOLECYSTECTOMY WITH INTRAOPERATIVE CHOLANGIOGRAM;  Surgeon: Armandina Gemma, MD;  Location: [facility];  Service: General;  Laterality: N/A;  . COLONOSCOPY    . CORONARY BALLOON ANGIOPLASTY N/A 02/26/2019   Procedure: CORONARY BALLOON ANGIOPLASTY;  Surgeon: Leonie Man, MD;  Location: [place] [facility];  Service: Cardiovascular -> scoring balloon PTCA (Wolverine 2.0 mm) of ostial D2 85% reducing to 20-30%.  . CORONARY STENT INTERVENTION N/A 02/26/2019   Procedure: CORONARY STENT INTERVENTION;  Surgeon: Leonie Man, MD;  Location: [place] [facility];; PCI ostial RPDA (initial attempt of PTCA only led to small local tear/dissection covered with stent) Resolute Onyx 2.5 mm x 15 mm (2.6 mm  . CORONARY STENT INTERVENTION N/A 07/05/2019   Procedure: CORONARY STENT INTERVENTION;  Surgeon: Leonie Man, MD;  Location: [place] [facility];  Service: Cardiovascular;; ostial D1 95% (restenosis of PTCA site) -> DES PCI RESOLUTE ONYX 2.25 mm x 50 mm (2.6 mm) positioned to avoid the true ostium.   . ESOPHAGOGASTRODUODENOSCOPY  N/A 03/13/2014   Procedure: ESOPHAGOGASTRODUODENOSCOPY (EGD);  Surgeon: Wonda Horner, MD;  Location: [facility];  Service: Endoscopy;  Laterality: N/A;  . INTRAVASCULAR PRESSURE WIRE/FFR STUDY N/A 02/26/2019   Procedure: INTRAVASCULAR PRESSURE WIRE/FFR STUDY;  Surgeon: Leonie Man, MD;  Location: [place] [facility];  Service: Cardiovascular;; mCx ~65% - DFR 0.92, FR 0.82 - BORDERLINE--> MED Rx  . LEFT HEART CATH AND CORONARY ANGIOGRAPHY N/A 02/26/2019   Procedure: LEFT HEART CATH  AND CORONARY ANGIOGRAPHY;  Surgeon: Leonie Man, MD;  Location: [place] [facility];  EF 55-65%. mCx 65% (FFR 0.82 - Med Rx). D2 85% - Wolverine Scoring Balloon PTCA (2.0 mm). Ost rPDA - 90% -Resolute Onyx 2.5 x 15 (2.6 mm).   . LEFT HEART CATH AND CORONARY ANGIOGRAPHY N/A 07/05/2019   Procedure: LEFT HEART CATH AND CORONARY ANGIOGRAPHY;  Surgeon: Leonie Man, MD;  Location: [facility] IN[facility]AS[facility] LAB;; ostial D1 95% (restenosis of PTCA site) -> DES PCI.  PDA stent widely patent.  Mid CX stable 65% lesion.  Proximal RCA 40%.  Mid RCA 45%.  Normal EDP.  [name] 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  . SHOULDER ARTHROSCOPY Bilateral   . SHOULDER ARTHROSCOPY W/ ROTATOR CUFF REPAIR Bilateral 2004-2009   left 2004, right 2009  . VASECTOMY    . WISDOM TOOTH EXTRACTION      Family History  Problem Relation Age of Onset  . Hypertension Mother   . Hypertension Father   . Arthritis Sister        rheumatoid  . Stroke Sister 28    Social History   Socioeconomic History  . Marital status: Married    Spouse name: Not on file  . Number of children: Not on file  . Years of education: Not on file  . Highest education level: Not on file  Occupational History  . Not on file  Tobacco Use  . Smoking status: Former Smoker    Packs/day: 1.00    Years: 5.00    Pack years: 5.00    Types: Cigarettes    Quit date: 10/17/1968    Years since quitting: 51.3  . Smokeless tobacco: Never Used  Vaping Use  . Vaping Use: Never used  Substance and Sexual Activity  . Alcohol use: Not Currently    Comment: "quit drinking ~ 10/1968  . Drug use: No  . Sexual activity: Not on file  Other Topics Concern  . Not on file  Social History Narrative  . Not on file   Social Determinants of Health   Financial Resource Strain:   . Difficulty of Paying Living Expenses:   Food Insecurity:   . Worried About Charity fundraiser in the Last Year:   . Arboriculturist in the Last Year:   Transportation  Needs:   . Film/video editor (Medical):   [name] Lack of Transportation (Non-Medical):   Physical Activity:   . Days of Exercise per Week:   . Minutes of Exercise per Session:   Stress:   . Feeling of Stress :   Social Connections:   . Frequency of Communication with Friends and Family:   . Frequency of Social Gatherings with Friends and Family:   . Attends Religious Services:   . Active Member of Clubs or Organizations:   . Attends Archivist Meetings:   [name] Marital Status:   Intimate Partner Violence:   . Fear of Current or Ex-Partner:   . Emotionally  Abused:   [name] Physically Abused:   . Sexually Abused:     Outpatient Medications Prior to Visit  Medication Sig Dispense Refill  . aspirin EC 81 MG tablet Take 1 tablet (81 mg total) by mouth every evening. (Patient taking differently: Take 81 mg by mouth at bedtime. )    . baclofen (LIORESAL) 10 MG tablet Take 10 mg by mouth daily as needed for muscle spasms.    . bimatoprost (LUMIGAN) 0.03 % ophthalmic solution Place 1 drop into both eyes at bedtime.    . Brinzolamide-Brimonidine (SIMBRINZA) 1-0.2 % SUSP Place 1 drop into both eyes 2 (two) times daily.     . clopidogrel (PLAVIX) 75 MG tablet TAKE 1 TABLET BY MOUTH  DAILY 90 tablet 3  . Evolocumab (REPATHA SURECLICK) 211 MG/ML SOAJ Inject 140 mg into the skin every 14 (fourteen) days. 6 pen 3  . hydrochlorothiazide (HYDRODIURIL) 25 MG tablet Take 1 tablet (25 mg total) by mouth daily. Starting in Aug 2021 90 tablet 3  . hydrochlorothiazide (MICROZIDE) 12.5 MG capsule Take 12.5 mg by mouth daily.    [name] HYDROCORTISONE EX Apply 1 application topically 2 (two) times daily as needed (insect bites/itching).     . isosorbide mononitrate (IMDUR) 30 MG 24 hr tablet Take 1 tablet (30 mg total) by mouth daily. 90 tablet 3  . metoprolol succinate (TOPROL XL) 25 MG 24 hr tablet Take 0.5 tablets (12.5 mg total) by mouth daily. 45 tablet 3  . nitroGLYCERIN (NITROSTAT) 0.4 MG SL tablet Place  1 tablet (0.4 mg total) under the tongue every 5 (five) minutes as needed for chest pain. 25 tablet 3  . valsartan (DIOVAN) 320 MG tablet Take 160 mg by mouth daily.    . potassium chloride SA (KLOR-CON) 20 MEQ tablet Take 1 tablet (20 mEq total) by mouth as needed. 90 tablet 3   No facility-administered medications prior to visit.    Allergies  Allergen Reactions  . Penicillins Itching    Has patient had a PCN reaction causing immediate rash, facial/tongue/throat swelling, SOB or lightheadedness with hypotension: no Has patient had a PCN reaction causing severe rash involving mucus membranes or skin necrosis: unkn Has patient had a PCN reaction that required hospitalization: no Has patient had a PCN reaction occurring within the last 10 years: no If all of the above answers are "NO", then may proceed with Cephalosporin use.     ROS Review of Systems  Constitutional: Negative for chills, fatigue and fever.  Eyes: Negative for visual disturbance.  Respiratory: Negative for cough, chest tightness and shortness of breath.   Cardiovascular: Negative for chest pain, palpitations and leg swelling.  Genitourinary: Negative for dysuria.  Neurological: Negative for dizziness, syncope, weakness, light-headedness and headaches.      Objective:    Physical Exam Vitals reviewed.  Constitutional:      Appearance: Normal appearance.  Neck:     Comments: No bruit Cardiovascular:     Rate and Rhythm: Normal rate.  Pulmonary:     Effort: Pulmonary effort is normal.     Breath sounds: Normal breath sounds.  Musculoskeletal:     Cervical back: Neck supple.     Right lower leg: No edema.     Left lower leg: No edema.  Lymphadenopathy:     Cervical: No cervical adenopathy.  Neurological:     Mental Status: He is alert.     BP 124/70 (BP Location: Left Arm, Patient Position: Sitting, Cuff Size: Normal)  Pulse 65   Temp 97.6 F (36.4 C) (Temporal)   Wt 192 lb 12.8 oz (87.5 kg)    SpO2 97%   BMI 26.15 kg/m  Wt Readings from Last 3 Encounters:  02/19/20 192 lb 12.8 oz (87.5 kg)  02/13/20 192 lb 12.8 oz (87.5 kg)  10/30/19 185 lb (83.9 kg)     Health Maintenance Due  Topic Date Due  . OPHTHALMOLOGY EXAM  03/30/2019  . FOOT EXAM  05/03/2019  . HEMOGLOBIN A1C  11/03/2019    There are no preventive care reminders to display for this patient.  Lab Results  Component Value Date   TSH 0.61 05/06/2019   Lab Results  Component Value Date   WBC 7.3 07/01/2019   HGB 14.9 07/01/2019   HCT 42.6 07/01/2019   MCV 89 07/01/2019   PLT 221 07/01/2019   Lab Results  Component Value Date   NA 141 08/26/2019   K 4.2 08/26/2019   CO2 24 08/26/2019   GLUCOSE 102 (H) 08/26/2019   BUN 12 08/26/2019   CREATININE 0.84 08/26/2019   BILITOT 0.6 05/06/2019   ALKPHOS 100 05/06/2019   AST 20 05/06/2019   ALT 16 05/06/2019   PROT 6.3 05/06/2019   ALBUMIN 4.1 05/06/2019   CALCIUM 9.4 08/26/2019   ANIONGAP 7 02/27/2019   GFR 88.33 05/06/2019   Lab Results  Component Value Date   CHOL 110 05/06/2019   Lab Results  Component Value Date   HDL 37.40 (L) 05/06/2019   Lab Results  Component Value Date   LDLCALC 53 05/06/2019   Lab Results  Component Value Date   TRIG 101.0 05/06/2019   Lab Results  Component Value Date   CHOLHDL 3 05/06/2019   Lab Results  Component Value Date   HGBA1C 6.1 05/06/2019      Assessment & Plan:   #1 preoperative clearance for left total hip replacement.  Already cleared by cardiology.  Patient knows to hold Plavix and aspirin for 7 days prior to surgery and resume 2 days after surgery.  No medical contraindications for surgery at this time.  He has hypertension which is been well controlled with combination therapy  #2 fatigue.  Probably related to some chronic insomnia -Avoid caffeine use after about 1 PM -Consider trial of over-the-counter melatonin 3 to 5 mg nightly -If not getting relief with the above could consider  trial of - low-dose trazodone after his surgery -Avoid medications with high anticholinergic side effects such as Elavil or nortriptyline would also avoid medication such as Ambien or benzodiazepines  No orders of the defined types were placed in this encounter.   Follow-up: No follow-ups on file.    Carolann Littler, MD

## 2020-02-19 NOTE — Patient Instructions (Signed)
Consider trial of melatonin 3 to 5 mg for insomnia.    Be sure to stop your Plavix and aspirin 7 days prior to surgery.

## 2020-02-20 ENCOUNTER — Encounter (HOSPITAL_COMMUNITY): Payer: Self-pay

## 2020-02-20 NOTE — Progress Notes (Addendum)
COVID Vaccine Completed: Yes Date COVID Vaccine completed: 08/30/2019 and 09/20/2019 COVID vaccine manufacturer: Pfizer      PCP -  Dr. Girtha Hake last office visit pre op exam  02/19/2020 in epic and in chart Cardiologist - Dr. Roni Bread last office visit and pre op exam 02/13/2020 in epic   Chest x-ray - N/A EKG - 02/13/2020 in epic Stress Test - 02/2019 in epic ECHO - 02/14/2020 in epic Cardiac Cath - 07/05/2019 in epic  Sleep Study - N/A CPAP - N/A  Fasting Blood Sugar - N/A Checks Blood Sugar __N/A___ times a day  Blood Thinner Instructions: Plavix (7 days per office note) Aspirin Instructions:(7 days per office note) Last Dose: patient verbalized knowledge of stopping 7 days prior  Anesthesia review: CAD/PTCA, AAA, HTN  Patient denies shortness of breath, fever, cough and chest pain at PAT appointment   Patient verbalized understanding of instructions that were given to them at the PAT appointment. Patient was also instructed that they will need to review over the PAT instructions again at home before surgery.

## 2020-02-20 NOTE — Patient Instructions (Addendum)
DUE TO COVID-19 ONLY ONE VISITOR ARE ALLOWED TO COME WITH YOU AND STAY IN THE WAITING ROOM ONLY DURING PRE OP AND PROCEDURE. THEN TWO VISITORS MAY VISIT WITH YOU IN YOUR PRIVATE ROOM DURING VISITING HOURS ONLY!!   MUST BRING PROOF OF COVID VACCINATION IMMUNIZATION CARD DAY OF SURGERY        Your procedure is scheduled on: Wednesday, March 04, 2020   Report to [facility] Main  Entrance   Report to Short Stay at 6:00  AM   [facility])   Call this number if you have problems the morning of surgery [phone]   Do not eat food:After Midnight.   May have liquids until 5:30 AM   day of surgery   CLEAR LIQUID DIET  Foods Allowed                                                                     Foods Excluded  Water, Black Coffee and tea, regular and decaf                             liquids that you cannot  Plain Jell-O in any flavor  (No red)                                           see through such as: Fruit ices (not with fruit pulp)                                     milk, soups, orange juice  Iced Popsicles (No red)                                    All solid food                                   Apple juices Sports drinks like Gatorade (No red) Lightly seasoned clear broth or consume(fat free) Sugar, honey syrup  Sample Menu Breakfast                                Lunch                                     Supper Cranberry juice                    Beef broth                            Chicken broth Jell-O  Grape juice                           Apple juice Coffee or tea                        Jell-O                                      Popsicle                                                Coffee or tea                        Coffee or tea   Complete one Ensure drink the morning of surgery at  5:30 AM the day of surgery.  Oral Hygiene is also important to reduce your risk of infection.                                     Remember - BRUSH YOUR TEETH THE MORNING OF SURGERY WITH YOUR REGULAR TOOTHPASTE   Do NOT smoke after Midnight   Take these medicines the morning of surgery with A SIP OF WATER: IMDUR   May use eyedrops morning of surgery                               You may not have any metal on your body including jewelry, and body piercings             Do not wear lotions, powders, perfumes/cologne, or deodorant                           Men may shave face and neck.   Do not bring valuables to the hospital. [place].   Contacts, dentures or bridgework may not be worn into surgery.   Bring small overnight bag day of surgery.    Patients discharged the day of surgery will not be allowed to drive home.   Special Instructions: Bring a copy of your healthcare power of attorney and living will documents         the day of surgery if you haven't scanned them in before.              Please read over the following fact sheets you were given: IF YOU HAVE QUESTIONS ABOUT YOUR PRE OP INSTRUCTIONS PLEASE CALL [phone]    - Preparing for Surgery Before surgery, you can play an important role.  Because skin is not sterile, your skin needs to be as free of germs as possible.  You can reduce the number of germs on your skin by washing with CHG (chlorahexidine gluconate) soap before surgery.  CHG is an antiseptic cleaner which kills germs and bonds with the skin to continue killing germs even after washing. Please DO NOT use if you have an allergy to CHG or  antibacterial soaps.  If your skin becomes reddened/irritated stop using the CHG and inform your nurse when you arrive at Short Stay. Do not shave (including legs and underarms) for at least 48 hours prior to the first CHG shower.  You may shave your face/neck.  Please follow these instructions carefully:  1.  Shower with CHG Soap the night before surgery and the  morning of surgery.  2.  If you  choose to wash your hair, wash your hair first as usual with your normal  shampoo.  3.  After you shampoo, rinse your hair and body thoroughly to remove the shampoo.                             4.  Use CHG as you would any other liquid soap.  You can apply chg directly to the skin and wash.  Gently with a scrungie or clean washcloth.  5.  Apply the CHG Soap to your body ONLY FROM THE NECK DOWN.   Do   not use on face/ open                           Wound or open sores. Avoid contact with eyes, ears mouth and   genitals (private parts).                       Wash face,  Genitals (private parts) with your normal soap.             6.  Wash thoroughly, paying special attention to the area where your    surgery  will be performed.  7.  Thoroughly rinse your body with warm water from the neck down.  8.  DO NOT shower/wash with your normal soap after using and rinsing off the CHG Soap.                9.  Pat yourself dry with a clean towel.            10.  Wear clean pajamas.            11.  Place clean sheets on your bed the night of your first shower and do not  sleep with pets. Day of Surgery : Do not apply any lotions/deodorants the morning of surgery.  Please wear clean clothes to the hospital/surgery center.  FAILURE TO FOLLOW THESE INSTRUCTIONS MAY RESULT IN THE CANCELLATION OF YOUR SURGERY  PATIENT SIGNATURE_________________________________  NURSE SIGNATURE__________________________________  ________________________________________________________________________   Luis Guzman  An incentive spirometer is a tool that can help keep your lungs clear and active. This tool measures how well you are filling your lungs with each breath. Taking long deep breaths may help reverse or decrease the chance of developing breathing (pulmonary) problems (especially infection) following:  A long period of time when you are unable to move or be active. BEFORE THE PROCEDURE   If the spirometer  includes an indicator to show your best effort, your nurse or respiratory therapist will set it to a desired goal.  If possible, sit up straight or lean slightly forward. Try not to slouch.  Hold the incentive spirometer in an upright position. INSTRUCTIONS FOR USE  1. Sit on the edge of your bed if possible, or sit up as far as you can in bed or on a chair. 2. Hold the incentive spirometer in an upright position.  3. Breathe out normally. 4. Place the mouthpiece in your mouth and seal your lips tightly around it. 5. Breathe in slowly and as deeply as possible, raising the piston or the ball toward the top of the column. 6. Hold your breath for 3-5 seconds or for as long as possible. Allow the piston or ball to fall to the bottom of the column. 7. Remove the mouthpiece from your mouth and breathe out normally. 8. Rest for a few seconds and repeat Steps 1 through 7 at least 10 times every 1-2 hours when you are awake. Take your time and take a few normal breaths between deep breaths. 9. The spirometer may include an indicator to show your best effort. Use the indicator as a goal to work toward during each repetition. 10. After each set of 10 deep breaths, practice coughing to be sure your lungs are clear. If you have an incision (the cut made at the time of surgery), support your incision when coughing by placing a pillow or rolled up towels firmly against it. Once you are able to get out of bed, walk around indoors and cough well. You may stop using the incentive spirometer when instructed by your caregiver.  RISKS AND COMPLICATIONS  Take your time so you do not get dizzy or light-headed.  If you are in pain, you may need to take or ask for pain medication before doing incentive spirometry. It is harder to take a deep breath if you are having pain. AFTER USE  Rest and breathe slowly and easily.  It can be helpful to keep track of a log of your progress. Your caregiver can provide you with a  simple table to help with this. If you are using the spirometer at home, follow these instructions: [place] IF:   You are having difficultly using the spirometer.  You have trouble using the spirometer as often as instructed.  Your pain medication is not giving enough relief while using the spirometer.  You develop fever of 100.5 F (38.1 C) or higher. SEEK IMMEDIATE MEDICAL CARE IF:   You cough up bloody sputum that had not been present before.  You develop fever of 102 F (38.9 C) or greater.  You develop worsening pain at or near the incision site. MAKE SURE YOU:   Understand these instructions.  Will watch your condition.  Will get help right away if you are not doing well or get worse. Document Released: 12/12/2006 Document Revised: 10/24/2011 Document Reviewed: 02/12/2007 ExitCare Patient Information 2014 ExitCare, [place].   ________________________________________________________________________  WHAT IS A BLOOD TRANSFUSION? Blood Transfusion Information  A transfusion is the replacement of blood or some of its parts. Blood is made up of multiple cells which provide different functions.  Red blood cells carry oxygen and are used for blood loss replacement.  White blood cells fight against infection.  Platelets control bleeding.  Plasma helps clot blood.  Other blood products are available for specialized needs, such as hemophilia or other clotting disorders. BEFORE THE TRANSFUSION  Who gives blood for transfusions?   Healthy volunteers who are fully evaluated to make sure their blood is safe. This is blood bank blood. Transfusion therapy is the safest it has ever been in the practice of medicine. Before blood is taken from a donor, a complete history is taken to make sure that person has no history of diseases nor engages in risky social behavior (examples are intravenous drug use or sexual activity with multiple partners). The  donor's travel history  is screened to minimize risk of transmitting infections, such as malaria. The donated blood is tested for signs of infectious diseases, such as HIV and hepatitis. The blood is then tested to be sure it is compatible with you in order to minimize the chance of a transfusion reaction. If you or a relative donates blood, this is often done in anticipation of surgery and is not appropriate for emergency situations. It takes many days to process the donated blood. RISKS AND COMPLICATIONS Although transfusion therapy is very safe and saves many lives, the main dangers of transfusion include:   Getting an infectious disease.  Developing a transfusion reaction. This is an allergic reaction to something in the blood you were given. Every precaution is taken to prevent this. The decision to have a blood transfusion has been considered carefully by your caregiver before blood is given. Blood is not given unless the benefits outweigh the risks. AFTER THE TRANSFUSION  Right after receiving a blood transfusion, you will usually feel much better and more energetic. This is especially true if your red blood cells have gotten low (anemic). The transfusion raises the level of the red blood cells which carry oxygen, and this usually causes an energy increase.  The nurse administering the transfusion will monitor you carefully for complications. HOME CARE INSTRUCTIONS  No special instructions are needed after a transfusion. You may find your energy is better. Speak with your caregiver about any limitations on activity for underlying diseases you may have. SEEK MEDICAL CARE IF:   Your condition is not improving after your transfusion.  You develop redness or irritation at the intravenous (IV) site. SEEK IMMEDIATE MEDICAL CARE IF:  Any of the following symptoms occur over the next 12 hours:  Shaking chills.  You have a temperature by mouth above 102 F (38.9 C), not controlled by medicine.  Chest, back, or  muscle pain.  People around you feel you are not acting correctly or are confused.  Shortness of breath or difficulty breathing.  Dizziness and fainting.  You get a rash or develop hives.  You have a decrease in urine output.  Your urine turns a dark color or changes to pink, red, or brown. Any of the following symptoms occur over the next 10 days:  You have a temperature by mouth above 102 F (38.9 C), not controlled by medicine.  Shortness of breath.  Weakness after normal activity.  The white part of the eye turns yellow (jaundice).  You have a decrease in the amount of urine or are urinating less often.  Your urine turns a dark color or changes to pink, red, or brown. Document Released: 07/29/2000 Document Revised: 10/24/2011 Document Reviewed: 03/17/2008 [facility] Patient Information 2014 [place], [place].  _______________________________________________________________________

## 2020-02-24 ENCOUNTER — Other Ambulatory Visit: Payer: Self-pay

## 2020-02-24 ENCOUNTER — Encounter (HOSPITAL_COMMUNITY): Payer: Self-pay

## 2020-02-24 ENCOUNTER — Encounter (HOSPITAL_COMMUNITY)
Admission: RE | Admit: 2020-02-24 | Discharge: 2020-02-24 | Disposition: A | Discharge status: Home / Self Care | Payer: Medicare Other | Source: Ambulatory Visit | Attending: Orthopedic Surgery | Admitting: Orthopedic Surgery

## 2020-02-24 DIAGNOSIS — M169 Osteoarthritis of hip, unspecified: Secondary | ICD-10-CM | POA: Diagnosis not present

## 2020-02-24 HISTORY — DX: Abdominal aortic aneurysm, without rupture: I71.4

## 2020-02-24 HISTORY — DX: Personal history of other endocrine, nutritional and metabolic disease: Z86.39

## 2020-02-24 HISTORY — DX: Personal history of other diseases of the digestive system: Z87.19

## 2020-02-24 HISTORY — DX: Elevated prostate specific antigen (PSA): R97.20

## 2020-02-24 HISTORY — DX: Abdominal aortic aneurysm, without rupture, unspecified: I71.40

## 2020-02-24 HISTORY — DX: Anemia, unspecified: D64.9

## 2020-02-24 LAB — CBC
HCT: 47.7 % (ref 39.0–52.0)
Hemoglobin: 15.9 g/dL (ref 13.0–17.0)
MCH: 31.1 pg (ref 26.0–34.0)
MCHC: 33.3 g/dL (ref 30.0–36.0)
MCV: 93.2 fL (ref 80.0–100.0)
Platelets: 237 10*3/uL (ref 150–400)
RBC: 5.12 MIL/uL (ref 4.22–5.81)
RDW: 12.6 % (ref 11.5–15.5)
WBC: 8.5 10*3/uL (ref 4.0–10.5)
nRBC: 0 % (ref 0.0–0.2)

## 2020-02-24 LAB — COMPREHENSIVE METABOLIC PANEL
ALT: 22 U/L (ref 0–44)
AST: 28 U/L (ref 15–41)
Albumin: 4.3 g/dL (ref 3.5–5.0)
Alkaline Phosphatase: 86 U/L (ref 38–126)
Anion gap: 9 (ref 5–15)
BUN: 14 mg/dL (ref 8–23)
CO2: 26 mmol/L (ref 22–32)
Calcium: 9.2 mg/dL (ref 8.9–10.3)
Chloride: 105 mmol/L (ref 98–111)
Creatinine, Ser: 0.86 mg/dL (ref 0.61–1.24)
GFR calc Af Amer: >60 mL/min (ref >60)
GFR calc non Af Amer: >60 mL/min (ref >60)
Glucose, Bld: 107 mg/dL — ABNORMAL HIGH (ref 70–99)
Potassium: 3.8 mmol/L (ref 3.5–5.1)
Sodium: 140 mmol/L (ref 135–145)
Total Bilirubin: 0.7 mg/dL (ref 0.3–1.2)
Total Protein: 7.3 g/dL (ref 6.5–8.1)

## 2020-02-24 LAB — PROTIME-INR
INR: 1 (ref 0.8–1.2)
Prothrombin Time: 12.8 seconds (ref 11.4–15.2)

## 2020-02-24 LAB — HEMOGLOBIN A1C
Hgb A1c MFr Bld: 5.9 % — ABNORMAL HIGH (ref 4.8–5.6)
Mean Plasma Glucose: 122.63 mg/dL

## 2020-02-24 LAB — SURGICAL PCR SCREEN
MRSA, PCR: NEGATIVE
Staphylococcus aureus: NEGATIVE

## 2020-02-24 LAB — APTT: aPTT: 31 seconds (ref 24–36)

## 2020-02-25 NOTE — Anesthesia Preprocedure Evaluation (Addendum)
Anesthesia Evaluation  Patient identified by MRN, date of birth, ID band Patient awake    Reviewed: Allergy & Precautions, NPO status , Patient's Chart, lab work & pertinent test results, reviewed documented beta blocker date and time   History of Anesthesia Complications Negative for: history of anesthetic complications  Airway Mallampati: I  TM Distance: >3 FB Neck ROM: Full    Dental  (+) Dental Advisory Given   Pulmonary former smoker,  02/29/2020 SARS coronavirus NEG   breath sounds clear to auscultation       Cardiovascular hypertension, Pt. on medications and Pt. on home beta blockers (-) angina+ CAD, + Cardiac Stents (7/202, 06/2019) and + Peripheral Vascular Disease   Rhythm:Regular Rate:Normal  02/14/2020 ECHO: EF 55-60%, trivial MR, mild AI   Neuro/Psych negative neurological ROS     GI/Hepatic Neg liver ROS, GERD  Controlled and Medicated,  Endo/Other  negative endocrine ROS  Renal/GU negative Renal ROS     Musculoskeletal  (+) Arthritis , Osteoarthritis,    Abdominal   Peds  Hematology plt 237K, INR 1.0 Plavix: last dose 02/25/2020   Anesthesia Other Findings   Reproductive/Obstetrics                          Anesthesia Physical Anesthesia Plan  ASA: III  Anesthesia Plan: Spinal   Post-op Pain Management:    Induction:   PONV Risk Score and Plan: 1  Airway Management Planned: Natural Airway and Simple Face Mask  Additional Equipment: None  Intra-op Plan:   Post-operative Plan:   Informed Consent: I have reviewed the patients History and Physical, chart, labs and discussed the procedure including the risks, benefits and alternatives for the proposed anesthesia with the patient or authorized representative who has indicated his/her understanding and acceptance.     Dental advisory given  Plan Discussed with: CRNA and Surgeon  Anesthesia Plan Comments: (See PAT  note 02/24/2020, Konrad Felix, PA-C)      Anesthesia Quick Evaluation

## 2020-02-25 NOTE — Progress Notes (Signed)
Anesthesia Chart Review   Case: [id number] Date/Time: 03/04/20 0815   Procedure: TOTAL HIP ARTHROPLASTY ANTERIOR APPROACH (Left Hip) - 162min   Anesthesia type: Choice   Pre-op diagnosis: left hip osteoarthritis   [facility] ROOM 10 / [facility]   Surgeons: Gaynelle Arabian, MD      DISCUSSION:74 y.o. former smoker (5 pack years, quit 10/17/68) with h/o HLD, GERD, HTN, CAD, DM II, left hip OA scheduled for above procedure 03/04/20 with Dr. Gaynelle Arabian.   Seen by cardiology 02/13/2020 for preoperative evaluation.  Per OV note, "Davon is a relatively active gentleman who prior to being limited by his hip pains and had a easily been able to achieve more than 4 METS.  Now he does have deconditioning from lack of activity, but is not having any further angina post PCI.  He does have exertional fatigue which is probably from deconditioning. He does not have diabetes, has normal renal function, no stroke history and no CHF. Hip surgery would be considered intermediate risk surgery. He would be considered class I to class II risk with roughly 3 to 6% risk of adverse cardiac event.  This is mitigated partially by being on Imdur.  For that reason I would like to continue Imdur perioperatively. We will check a 2D echocardiogram because of a sense of dyspnea and he has yet to have one checked.  There is also concern for history of aging or exposure.  EF by LV gram at the time of cath was normal and LVEDP was normal, so would hope that his echo was normal.  If that is the case then he should be LV fine proceeding with surgery."  Echo 02/14/2020 with EF 55-60%.    Pt seen by PCP 02/19/2020 for preop evaluation.  Per OV note, "Already cleared by cardiology.  Patient knows to hold Plavix and aspirin for 7 days prior to surgery and resume 2 days after surgery.  No medical contraindications for surgery at this time.  He has hypertension which is been well controlled with combination therapy"  Anticipate pt can proceed with  planned procedure barring acute status change.   VS: BP 115/62   Pulse 72   Temp 36.8 C (Oral)   Resp 16   Ht 6' (1.829 m)   Wt 86.5 kg   SpO2 97%   BMI 25.85 kg/m   PROVIDERS: Eulas Post, MD is PCP   Glenetta Hew, MD is Cardiologist  LABS: Labs reviewed: Acceptable for surgery. (all labs ordered are listed, but only abnormal results are displayed)  Labs Reviewed  COMPREHENSIVE METABOLIC PANEL - Abnormal; Notable for the following components:      Result Value   Glucose, Bld 107 (*)    All other components within normal limits  HEMOGLOBIN A1C - Abnormal; Notable for the following components:   Hgb A1c MFr Bld 5.9 (*)    All other components within normal limits  SURGICAL PCR SCREEN  APTT  CBC  PROTIME-INR  TYPE AND SCREEN     IMAGES:   EKG: 02/13/2020 Rate 76 bpm  NSR  CV: Echo 02/14/2020 IMPRESSIONS    1. Left ventricular ejection fraction, by estimation, is 55 to 60%. The  left ventricle has normal function. The left ventricle has no regional  wall motion abnormalities. Left ventricular diastolic parameters are  consistent with Grade I diastolic  dysfunction (impaired relaxation).  2. Right ventricular systolic function is normal. The right ventricular  size is normal. There is normal pulmonary artery  systolic pressure.  3. The mitral valve is normal in structure. Trivial mitral valve  regurgitation. No evidence of mitral stenosis.  4. The aortic valve is tricuspid. Aortic valve regurgitation is mild. No  aortic stenosis is present.  5. The inferior vena cava is normal in size with greater than 50%  respiratory variability, suggesting right atrial pressure of 3 mmHg.   Cardiac Cath 07/05/2019  1st Diag lesion is 95% stenosed at location previously treated scoring balloon angioplasty.  A drug-eluting stent was successfully placed-intentionally avoiding the ostium-using a STENT RESOLUTE ONYX 2.25X15. Postdilated to 2.6 mm in the mid  stent  Post intervention, there is a 0% residual stenosis.  -------------------------------------  Previously placed RPDA drug eluting stent is widely patent.  Mid Cx lesion is 65% stenosed. Stable.  Prox RCA lesion is 40% stenosed. Mid RCA lesion is 45% stenosed.  Normal LVEDP   SUMMARY  Three-vessel CAD with widely patent PDA stent, stable mid LCx lesion with severe restenosis of the ostial-proximal 1st Diag as the culprit lesion.  Successful DES PCI of 1st Diag carefully missed in the ostium to avoid impinging on the LAD--resolute Onyx DES 2.25 mm a 50 mm (2.6 mm)  Normal LVEDP  Past Medical History:  Diagnosis Date  . AAA (abdominal aortic aneurysm) ([place])   . Anemia    as infant history of  . CAD S/P PTCA & DES PCI - for Progressive Angina 02/26/2019   Cath-PCI 02/26/2019: EF 55-65%. mCx 65% (FFR 0.82 - Med Rx). D2 85% - Wolverine Scoring Balloon PTCA (2.0 mm). Ost rPDA - 90% -Resolute Onyx 2.5 x 15 (2.6 mm). --> 06/2019: staged DES PCI ost D1 (RESOLUTE ONYX 2.25 mm x 50 mm (2.6 mm) positioned to avoid the true ostium)  . Elevated PSA   . GERD (gastroesophageal reflux disease)   . History of diabetes mellitus, type II    loss weight under control currently  . History of hiatal hernia    40 yrs ago  . History of pancreatitis    elevated lipase levels   . HYPERLIPIDEMIA 01/26/2009  . HYPERTENSION 01/26/2009  . 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 lower back pain, s/p cervical neck surgery"stiffness" remains  . Transfusion history    infant "anemia"    Past Surgical History:  Procedure Laterality Date  . APPENDECTOMY  ~ 1959  . BACK SURGERY    . CERVICAL DISC ARTHROPLASTY N/A 07/30/2018   Procedure: Cervical five-six Cervical six-seven Artificial disc replacement;  Surgeon: Kristeen Miss, MD;  Location: [place];  Service: Neurosurgery;  Laterality: N/A;  . CERVICAL LAMINECTOMY  1997   C 4 and C 5  . CHOLECYSTECTOMY N/A 04/15/2014   Procedure:  LAPAROSCOPIC CHOLECYSTECTOMY WITH INTRAOPERATIVE CHOLANGIOGRAM;  Surgeon: Armandina Gemma, MD;  [facility];  Service: General;  Laterality: N/A;  . COLONOSCOPY    . CORONARY BALLOON ANGIOPLASTY N/A 02/26/2019   Procedure: CORONARY BALLOON ANGIOPLASTY;  Surgeon: Leonie Man, MD;  Location: [place] [facility] LAB;  Service: Cardiovascular -> scoring balloon PTCA (Wolverine 2.0 mm) of ostial D2 85% reducing to 20-30%.  . CORONARY STENT INTERVENTION N/A 02/26/2019   Procedure: CORONARY STENT INTERVENTION;  Surgeon: Leonie Man, MD;  Location: [place] [facility] LAB;; PCI ostial RPDA (initial attempt of PTCA only led to small local tear/dissection covered with stent) Resolute Onyx 2.5 mm x 15 mm (2.6 mm  . CORONARY STENT INTERVENTION N/A 07/05/2019   Procedure: CORONARY STENT INTERVENTION;  Surgeon: Leonie Man, MD;  Location: [facility];  Service: Cardiovascular;; ostial D1 95% (restenosis of PTCA site) -> DES PCI RESOLUTE ONYX 2.25 mm x 50 mm (2.6 mm) positioned to avoid the true ostium.   . ESOPHAGOGASTRODUODENOSCOPY N/A 03/13/2014   Procedure: ESOPHAGOGASTRODUODENOSCOPY (EGD);  Surgeon: Wonda Horner, MD;  Location: [facility] ENDOSCOPY;  Service: Endoscopy;  Laterality: N/A;  . INTRAVASCULAR PRESSURE WIRE/FFR STUDY N/A 02/26/2019   Procedure: INTRAVASCULAR PRESSURE WIRE/FFR STUDY;  Surgeon: Leonie Man, MD;  Location: [place] [facility] LAB;  Service: Cardiovascular;; mCx ~65% - DFR 0.92, FR 0.82 - BORDERLINE--> MED Rx  . LEFT HEART CATH AND CORONARY ANGIOGRAPHY N/A 02/26/2019   Procedure: LEFT HEART CATH AND CORONARY ANGIOGRAPHY;  Surgeon: Leonie Man, MD;  Location: [place] [facility];  EF 55-65%. mCx 65% (FFR 0.82 - Med Rx). D2 85% - Wolverine Scoring Balloon PTCA (2.0 mm). Ost rPDA - 90% -Resolute Onyx 2.5 x 15 (2.6 mm).   . LEFT HEART CATH AND CORONARY ANGIOGRAPHY N/A 07/05/2019   Procedure: LEFT HEART CATH AND CORONARY ANGIOGRAPHY;  Surgeon: Leonie Man, MD;  Location: [facility] INVASIVE  CV LAB;; ostial D1 95% (restenosis of PTCA site) -> DES PCI.  PDA stent widely patent.  Mid CX stable 65% lesion.  Proximal RCA 40%.  Mid RCA 45%.  Normal EDP.  [name] 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  . SHOULDER ARTHROSCOPY Bilateral   . SHOULDER ARTHROSCOPY W/ ROTATOR CUFF REPAIR Bilateral 2004-2009   left 2004, right 2009  . VASECTOMY    . WISDOM TOOTH EXTRACTION      MEDICATIONS: . aspirin EC 81 MG tablet  . baclofen (LIORESAL) 10 MG tablet  . bimatoprost (LUMIGAN) 0.03 % ophthalmic solution  . Brinzolamide-Brimonidine (SIMBRINZA) 1-0.2 % SUSP  . clopidogrel (PLAVIX) 75 MG tablet  . Evolocumab (REPATHA SURECLICK) 454 MG/ML SOAJ  . hydrochlorothiazide (HYDRODIURIL) 25 MG tablet  . hydrochlorothiazide (MICROZIDE) 12.5 MG capsule  . HYDROCORTISONE EX  . isosorbide mononitrate (IMDUR) 30 MG 24 hr tablet  . metoprolol succinate (TOPROL XL) 25 MG 24 hr tablet  . nitroGLYCERIN (NITROSTAT) 0.4 MG SL tablet  . potassium chloride SA (KLOR-CON) 20 MEQ tablet  . valsartan (DIOVAN) 320 MG tablet   No current facility-administered medications for this encounter.     Maia Plan [facility] Pre-Surgical Testing [phone] 02/25/20  4:08 PM

## 2020-02-26 NOTE — H&P (Signed)
TOTAL HIP ADMISSION H&P  Patient is admitted for left total hip arthroplasty.  Subjective:  Chief Complaint: Left hip pain  HPI: Luis Guzman, 74 y.o. male, has a history of pain and functional disability in the left hip due to arthritis and patient has failed non-surgical conservative treatments for greater than 12 weeks to include corticosteriod injections and activity modification. Onset of symptoms was gradual, starting 3 years ago with gradually worsening course since that time. The patient noted no past surgery on the left hip. Patient currently rates pain in the left hip at 10 out of 10 with activity. Patient has worsening of pain with activity and weight bearing, pain that interfers with activities of daily living and crepitus. 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 This condition presents safety issues increasing the risk of falls. There is no current active infection.  Patient Active Problem List   Diagnosis Date Noted  . DOE (dyspnea on exertion) 02/13/2020  . Pain in left knee 10/30/2019  . Fatigue due to treatment 07/18/2019  . Coronary artery disease involving native coronary artery of native heart with angina pectoris ([place]) 06/28/2019  . Preop cardiovascular exam 05/31/2019  . Hypokalemia 02/27/2019  . CAD S/P & DES PCI -ostial PDA and D1 02/26/2019  . Angina, class III ([place]) 02/14/2019  . Cervical spondylosis with myelopathy 07/30/2018  . AAA (abdominal aortic aneurysm) without rupture ([place]) 04/15/2016  . Biliary dyskinesia 04/08/2014  . Abdominal pain, epigastric 04/08/2014  . Pancreatitis, acute 03/12/2014  . Pancreatitis 03/12/2014  . Hyperglycemia 06/25/2012  . ACUTE BRONCHITIS 03/11/2010  . 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 SINUSITIS, ACUTE 06/11/2009  . Hyperlipidemia with target LDL less than 70 - statin intolerant  01/26/2009  . Essential hypertension 01/26/2009    Past Medical History:  Diagnosis Date  . AAA (abdominal aortic aneurysm) ([place])   . Anemia    as infant history of  . CAD S/P PTCA & DES PCI - for Progressive Angina 02/26/2019   Cath-PCI 02/26/2019: EF 55-65%. mCx 65% (FFR 0.82 - Med Rx). D2 85% - Wolverine Scoring Balloon PTCA (2.0 mm). Ost rPDA - 90% -Resolute Onyx 2.5 x 15 (2.6 mm). --> 06/2019: staged DES PCI ost D1 (RESOLUTE ONYX 2.25 mm x 50 mm (2.6 mm) positioned to avoid the true ostium)  . Elevated PSA   . GERD (gastroesophageal reflux disease)   . History of diabetes mellitus, type II    loss weight under control currently  . History of hiatal hernia    40 yrs ago  . History of pancreatitis    elevated lipase levels   . HYPERLIPIDEMIA 01/26/2009  . HYPERTENSION 01/26/2009  . 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 lower back pain, s/p cervical neck surgery"stiffness" remains  . Transfusion history    infant "anemia"    Past Surgical History:  Procedure Laterality Date  . APPENDECTOMY  ~ 1959  . BACK SURGERY    . CERVICAL DISC ARTHROPLASTY N/A 07/30/2018   Procedure: Cervical five-six Cervical six-seven Artificial disc replacement;  Surgeon: Kristeen Miss, MD;  Location: [facility];  Service: Neurosurgery;  Laterality: N/A;  . CERVICAL LAMINECTOMY  1997   C 4 and C 5  . CHOLECYSTECTOMY N/A 04/15/2014   Procedure: LAPAROSCOPIC CHOLECYSTECTOMY WITH INTRAOPERATIVE CHOLANGIOGRAM;  Surgeon: Armandina Gemma, MD;  Location: [facility];  Service: General;  Laterality: N/A;  . COLONOSCOPY    .  CORONARY BALLOON ANGIOPLASTY N/A 02/26/2019   Procedure: CORONARY BALLOON ANGIOPLASTY;  Surgeon: Leonie Man, MD;  Location: [facility];  Service: Cardiovascular -> scoring balloon PTCA (Wolverine 2.0 mm) of ostial D2 85% reducing to 20-30%.  . CORONARY STENT INTERVENTION N/A 02/26/2019   Procedure: CORONARY STENT INTERVENTION;  Surgeon: Leonie Man, MD;  Location:  [facility];; PCI ostial RPDA (initial attempt of PTCA only led to small local tear/dissection covered with stent) Resolute Onyx 2.5 mm x 15 mm (2.6 mm  . CORONARY STENT INTERVENTION N/A 07/05/2019   Procedure: CORONARY STENT INTERVENTION;  Surgeon: Leonie Man, MD;  Location: [facility];  Service: Cardiovascular;; ostial D1 95% (restenosis of PTCA site) -> DES PCI RESOLUTE ONYX 2.25 mm x 50 mm (2.6 mm) positioned to avoid the true ostium.   . ESOPHAGOGASTRODUODENOSCOPY N/A 03/13/2014   Procedure: ESOPHAGOGASTRODUODENOSCOPY (EGD);  Surgeon: Wonda Horner, MD;  Location: [facility];  Service: Endoscopy;  Laterality: N/A;  . INTRAVASCULAR PRESSURE WIRE/FFR STUDY N/A 02/26/2019   Procedure: INTRAVASCULAR PRESSURE WIRE/FFR STUDY;  Surgeon: Leonie Man, MD;  Location: [place];  Service: Cardiovascular;; mCx ~65% - DFR 0.92, FR 0.82 - BORDERLINE--> MED Rx  . LEFT HEART CATH AND CORONARY ANGIOGRAPHY N/A 02/26/2019   Procedure: LEFT HEART CATH AND CORONARY ANGIOGRAPHY;  Surgeon: Leonie Man, MD;  Location: [place];  EF 55-65%. mCx 65% (FFR 0.82 - Med Rx). D2 85% - Wolverine Scoring Balloon PTCA (2.0 mm). Ost rPDA - 90% -Resolute Onyx 2.5 x 15 (2.6 mm).   . LEFT HEART CATH AND CORONARY ANGIOGRAPHY N/A 07/05/2019   Procedure: LEFT HEART CATH AND CORONARY ANGIOGRAPHY;  Surgeon: Leonie Man, MD;  Location: [facility] INVASIVE [facility];; ostial D1 95% (restenosis of PTCA site) -> DES PCI.  PDA stent widely patent.  Mid CX stable 65% lesion.  Proximal RCA 40%.  Mid RCA 45%.  Normal EDP.  [name] 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  . SHOULDER ARTHROSCOPY Bilateral   . SHOULDER ARTHROSCOPY W/ ROTATOR CUFF REPAIR Bilateral 2004-2009   left 2004, right 2009  . VASECTOMY    . WISDOM TOOTH EXTRACTION      Prior to Admission medications   Medication Sig Start Date End Date Taking? Authorizing Provider  aspirin EC 81 MG tablet Take 1 tablet (81 mg total)  by mouth every evening. Patient taking differently: Take 81 mg by mouth at bedtime.  02/27/19  Yes Dunn, Dayna N, PA-C  baclofen (LIORESAL) 10 MG tablet Take 10 mg by mouth daily as needed for muscle spasms.   Yes [provider]  bimatoprost (LUMIGAN) 0.03 % ophthalmic solution Place 1 drop into both eyes at bedtime.   Yes [provider]  Brinzolamide-Brimonidine (SIMBRINZA) 1-0.2 % SUSP Place 1 drop into both eyes 2 (two) times daily.    Yes [provider]  Evolocumab (REPATHA SURECLICK) 950 MG/ML SOAJ Inject 140 mg into the skin every 14 (fourteen) days. 09/11/19  Yes Leonie Man, MD  hydrochlorothiazide (MICROZIDE) 12.5 MG capsule Take 12.5 mg by mouth daily.   Yes [provider]  isosorbide mononitrate (IMDUR) 30 MG 24 hr tablet Take 1 tablet (30 mg total) by mouth daily. 07/18/19  Yes Leonie Man, MD  metoprolol succinate (TOPROL XL) 25 MG 24 hr tablet Take 0.5 tablets (12.5 mg total) by mouth daily. 07/18/19  Yes Leonie Man, MD  nitroGLYCERIN (NITROSTAT) 0.4 MG  SL tablet Place 1 tablet (0.4 mg total) under the tongue every 5 (five) minutes as needed for chest pain. 10/25/19  Yes Leonie Man, MD  valsartan (DIOVAN) 320 MG tablet Take 160 mg by mouth daily.   Yes [provider]  clopidogrel (PLAVIX) 75 MG tablet TAKE 1 TABLET BY MOUTH  DAILY 02/14/20   Leonie Man, MD  hydrochlorothiazide (HYDRODIURIL) 25 MG tablet Take 1 tablet (25 mg total) by mouth daily. Starting in Aug 2021 02/13/20 05/13/20  Leonie Man, MD  HYDROCORTISONE EX Apply 1 application topically 2 (two) times daily as needed (insect bites/itching).     [provider]  potassium chloride SA (KLOR-CON) 20 MEQ tablet Take 1 tablet (20 mEq total) by mouth as needed. 07/18/19   Leonie Man, MD    Allergies  Allergen Reactions  . Penicillins Itching    Has patient had a PCN reaction causing immediate rash, facial/tongue/throat swelling, SOB or  lightheadedness with hypotension: no Has patient had a PCN reaction causing severe rash involving mucus membranes or skin necrosis: unkn Has patient had a PCN reaction that required hospitalization: no Has patient had a PCN reaction occurring within the last 10 years: no If all of the above answers are "NO", then may proceed with Cephalosporin use.     Social History   Socioeconomic History  . Marital status: Married    Spouse name: Not on file  . Number of children: Not on file  . Years of education: Not on file  . Highest education level: Not on file  Occupational History  . Not on file  Tobacco Use  . Smoking status: Former Smoker    Packs/day: 1.00    Years: 5.00    Pack years: 5.00    Types: Cigarettes    Quit date: 10/17/1968    Years since quitting: 51.3  . Smokeless tobacco: Never Used  Vaping Use  . Vaping Use: Never used  Substance and Sexual Activity  . Alcohol use: Not Currently    Comment: "quit drinking ~ 10/1968  . Drug use: No  . Sexual activity: Not on file  Other Topics Concern  . Not on file  Social History Narrative  . Not on file   Social Determinants of Health   Financial Resource Strain:   . Difficulty of Paying Living Expenses:   Food Insecurity:   . Worried About Charity fundraiser in the Last Year:   . Arboriculturist in the Last Year:   Transportation Needs:   . Film/video editor (Medical):   [name] Lack of Transportation (Non-Medical):   Physical Activity:   . Days of Exercise per Week:   . Minutes of Exercise per Session:   Stress:   . Feeling of Stress :   Social Connections:   . Frequency of Communication with Friends and Family:   . Frequency of Social Gatherings with Friends and Family:   . Attends Religious Services:   . Active Member of Clubs or Organizations:   . Attends Archivist Meetings:   [name] Marital Status:   Intimate Partner Violence:   . Fear of Current or Ex-Partner:   . Emotionally Abused:   [name]  Physically Abused:   . Sexually Abused:       Tobacco Use: Medium Risk  . Smoking Tobacco Use: Former Smoker  . Smokeless Tobacco Use: Never Used   Social History   Substance and Sexual Activity  Alcohol Use Not  Currently   Comment: "quit drinking ~ 10/1968    Family History  Problem Relation Age of Onset  . Hypertension Mother   . Hypertension Father   . Arthritis Sister        rheumatoid  . Stroke Sister 16    Review of Systems  Constitutional: Negative for chills and fever.  HENT: Negative for congestion, sore throat and tinnitus.   Eyes: Negative for double vision, photophobia and pain.  Respiratory: Negative for cough, shortness of breath and wheezing.   Cardiovascular: Negative for chest pain, palpitations and orthopnea.  Gastrointestinal: Negative for heartburn, nausea and vomiting.  Genitourinary: Negative for dysuria, frequency and urgency.  Musculoskeletal: Positive for joint pain.  Neurological: Negative for dizziness, weakness and headaches.     Objective:  Physical Exam: Well nourished and well developed.  General: Alert and oriented x3, cooperative and pleasant, no acute distress.  Head: normocephalic, atraumatic, neck supple.  Eyes: EOMI.  Respiratory: breath sounds clear in all fields, no wheezing, rales, or rhonchi. Cardiovascular: Regular rate and rhythm, no murmurs, gallops or rubs.  Abdomen: non-tender to palpation and soft, normoactive bowel sounds. Musculoskeletal:  Left Hip Exam:  The range of motion: Flexion to 100 degrees, Internal Rotation to 0 degrees, External Rotation to 10 degrees, and abduction to 20 degrees without discomfort.  There is no tenderness over the greater trochanteric bursa.   Calves soft and nontender. Motor function intact in LE. Strength 5/5 LE bilaterally. Neuro: Distal pulses 2+. Sensation to light touch intact in LE.  Imaging Review Plain radiographs demonstrate severe degenerative joint disease of the left  hip. The bone quality appears to be adequate for age and reported activity level.  Assessment/Plan:  End stage arthritis, left hip  The patient history, physical examination, clinical judgement of the provider and imaging studies are consistent with end stage degenerative joint disease of the left hip and total hip arthroplasty is deemed medically necessary. The treatment options including medical management, injection therapy, arthroscopy and arthroplasty were discussed at length. The risks and benefits of total hip arthroplasty were presented and reviewed. The risks due to aseptic loosening, infection, stiffness, dislocation/subluxation, thromboembolic complications and other imponderables were discussed. The patient acknowledged the explanation, agreed to proceed with the plan and consent was signed. Patient is being admitted for inpatient treatment for surgery, pain control, PT, OT, prophylactic antibiotics, VTE prophylaxis, progressive ambulation and ADLs and discharge planning.The patient is planning to be discharged home.   Patient's anticipated LOS is less than 2 midnights, meeting these requirements: - Lives within 1 hour of care - Has a competent adult at home to recover with post-op recover - NO history of  - Chronic pain requiring opiods  - Diabetes  - Heart failure  - Heart attack  - Stroke  - DVT/VTE  - Cardiac arrhythmia  - Respiratory Failure/COPD  - Renal failure  - Anemia  - Advanced Liver disease  Therapy Plans: HEP Disposition: Home with wife Planned DVT Prophylaxis: Plavix 75 mg + ASA 325 mg QD DME Needed: Gilford Rile, 3-in-1 PCP: Carolann Littler, MD Cardiologist: Glenetta Hew, MD TXA: IV Allergies: PCN (itching) Anesthesia Concerns: None BMI: 25  Other: - Hx CAD with stent placement - Holding Plavix/ASA 7 days prior to surgery per Dr. Ellyn Hack  - Patient was instructed on what medications to stop prior to surgery. - Follow-up visit in 2 weeks with Dr.  Wynelle Link - Begin physical therapy following surgery - Pre-operative lab work as pre-surgical testing - Prescriptions will be  provided in hospital at time of discharge  Theresa Duty, PA-C Orthopedic Surgery [facility] [place]

## 2020-02-29 ENCOUNTER — Other Ambulatory Visit (HOSPITAL_COMMUNITY)
Admission: RE | Admit: 2020-02-29 | Discharge: 2020-02-29 | Disposition: A | Discharge status: Home / Self Care | Payer: Medicare Other | Source: Ambulatory Visit | Attending: Orthopedic Surgery | Admitting: Orthopedic Surgery

## 2020-02-29 DIAGNOSIS — Z20822 Contact with and (suspected) exposure to covid-19: Secondary | ICD-10-CM | POA: Diagnosis not present

## 2020-02-29 DIAGNOSIS — Z01812 Encounter for preprocedural laboratory examination: Secondary | ICD-10-CM | POA: Insufficient documentation

## 2020-02-29 LAB — SARS CORONAVIRUS 2 (TAT 6-24 HRS): SARS Coronavirus 2: NEGATIVE

## 2020-03-04 ENCOUNTER — Observation Stay (HOSPITAL_COMMUNITY)
Admission: RE | Admit: 2020-03-04 | Discharge: 2020-03-05 | Disposition: A | Discharge status: Home / Self Care | Payer: Medicare Other | Attending: Orthopedic Surgery | Admitting: Orthopedic Surgery

## 2020-03-04 ENCOUNTER — Ambulatory Visit (HOSPITAL_COMMUNITY): Payer: Medicare Other | Admitting: Physician Assistant

## 2020-03-04 ENCOUNTER — Encounter (HOSPITAL_COMMUNITY): Payer: Self-pay | Admitting: Orthopedic Surgery

## 2020-03-04 ENCOUNTER — Observation Stay (HOSPITAL_COMMUNITY): Payer: Medicare Other

## 2020-03-04 ENCOUNTER — Ambulatory Visit (HOSPITAL_COMMUNITY): Payer: Medicare Other | Admitting: Certified Registered Nurse Anesthetist

## 2020-03-04 ENCOUNTER — Encounter (HOSPITAL_COMMUNITY)
Admission: RE | Disposition: A | Discharge status: Home / Self Care | Payer: Self-pay | Source: Home / Self Care | Attending: Orthopedic Surgery

## 2020-03-04 ENCOUNTER — Other Ambulatory Visit: Payer: Self-pay

## 2020-03-04 ENCOUNTER — Ambulatory Visit (HOSPITAL_COMMUNITY): Payer: Medicare Other

## 2020-03-04 DIAGNOSIS — S82122A Displaced fracture of lateral condyle of left tibia, initial encounter for closed fracture: Secondary | ICD-10-CM

## 2020-03-04 DIAGNOSIS — I25111 Atherosclerotic heart disease of native coronary artery with angina pectoris with documented spasm: Secondary | ICD-10-CM | POA: Diagnosis not present

## 2020-03-04 DIAGNOSIS — Z96642 Presence of left artificial hip joint: Secondary | ICD-10-CM | POA: Diagnosis not present

## 2020-03-04 DIAGNOSIS — Z96649 Presence of unspecified artificial hip joint: Secondary | ICD-10-CM

## 2020-03-04 DIAGNOSIS — M25552 Pain in left hip: Secondary | ICD-10-CM | POA: Diagnosis present

## 2020-03-04 DIAGNOSIS — K219 Gastro-esophageal reflux disease without esophagitis: Secondary | ICD-10-CM | POA: Diagnosis not present

## 2020-03-04 DIAGNOSIS — I10 Essential (primary) hypertension: Secondary | ICD-10-CM | POA: Diagnosis not present

## 2020-03-04 DIAGNOSIS — E119 Type 2 diabetes mellitus without complications: Secondary | ICD-10-CM | POA: Insufficient documentation

## 2020-03-04 DIAGNOSIS — M169 Osteoarthritis of hip, unspecified: Secondary | ICD-10-CM | POA: Diagnosis present

## 2020-03-04 DIAGNOSIS — I119 Hypertensive heart disease without heart failure: Secondary | ICD-10-CM | POA: Diagnosis not present

## 2020-03-04 DIAGNOSIS — Z471 Aftercare following joint replacement surgery: Secondary | ICD-10-CM | POA: Diagnosis not present

## 2020-03-04 DIAGNOSIS — Z79899 Other long term (current) drug therapy: Secondary | ICD-10-CM | POA: Insufficient documentation

## 2020-03-04 DIAGNOSIS — Z87891 Personal history of nicotine dependence: Secondary | ICD-10-CM | POA: Diagnosis not present

## 2020-03-04 DIAGNOSIS — Z7982 Long term (current) use of aspirin: Secondary | ICD-10-CM | POA: Diagnosis not present

## 2020-03-04 DIAGNOSIS — M1612 Unilateral primary osteoarthritis, left hip: Secondary | ICD-10-CM | POA: Diagnosis not present

## 2020-03-04 DIAGNOSIS — I2510 Atherosclerotic heart disease of native coronary artery without angina pectoris: Secondary | ICD-10-CM | POA: Diagnosis not present

## 2020-03-04 HISTORY — PX: TOTAL HIP ARTHROPLASTY: SHX124

## 2020-03-04 LAB — TYPE AND SCREEN
ABO/RH(D): O POS
Antibody Screen: NEGATIVE

## 2020-03-04 LAB — GLUCOSE, CAPILLARY: Glucose-Capillary: 95 mg/dL (ref 70–99)

## 2020-03-04 SURGERY — ARTHROPLASTY, HIP, TOTAL, ANTERIOR APPROACH
Anesthesia: Spinal | Site: Hip | Laterality: Left

## 2020-03-04 MED ORDER — OXYCODONE HCL 5 MG/5ML PO SOLN: 5.0000 mg | Freq: Once | ORAL | Status: DC | PRN

## 2020-03-04 MED ORDER — WATER FOR IRRIGATION, STERILE IR SOLN
Status: DC | PRN
  Administered 2020-03-04: 2000 mL

## 2020-03-04 MED ORDER — ONDANSETRON HCL 4 MG/2ML IJ SOLN
INTRAMUSCULAR | Status: DC | PRN
  Administered 2020-03-04: 4 mg via INTRAVENOUS

## 2020-03-04 MED ORDER — ACETAMINOPHEN 500 MG PO TABS: 1000.0000 mg | ORAL_TABLET | Freq: Once | ORAL | Status: DC

## 2020-03-04 MED ORDER — HYDROMORPHONE HCL 1 MG/ML IJ SOLN
0.2500 mg | INTRAMUSCULAR | Status: DC | PRN
  Administered 2020-03-04 (×2): 0.5 mg via INTRAVENOUS

## 2020-03-04 MED ORDER — DOCUSATE SODIUM 100 MG PO CAPS
100.0000 mg | ORAL_CAPSULE | Freq: Two times a day (BID) | ORAL | Status: DC
  Administered 2020-03-04 – 2020-03-05 (×2): 100 mg via ORAL
  Filled 2020-03-04 (×2): qty 1

## 2020-03-04 MED ORDER — PHENOL 1.4 % MT LIQD: 1.0000 | OROMUCOSAL | Status: DC | PRN

## 2020-03-04 MED ORDER — HYDROCODONE-ACETAMINOPHEN 7.5-325 MG PO TABS
1.0000 | ORAL_TABLET | ORAL | Status: DC | PRN
  Administered 2020-03-04 – 2020-03-05 (×6): 2 via ORAL
  Filled 2020-03-04 (×6): qty 2

## 2020-03-04 MED ORDER — ISOSORBIDE MONONITRATE ER 30 MG PO TB24
30.0000 mg | ORAL_TABLET | Freq: Every day | ORAL | Status: DC
  Filled 2020-03-04: qty 1

## 2020-03-04 MED ORDER — 0.9 % SODIUM CHLORIDE (POUR BTL) OPTIME
TOPICAL | Status: DC | PRN
  Administered 2020-03-04: 1000 mL

## 2020-03-04 MED ORDER — ASPIRIN EC 325 MG PO TBEC
325.0000 mg | DELAYED_RELEASE_TABLET | Freq: Every day | ORAL | Status: DC
  Administered 2020-03-05: 325 mg via ORAL
  Filled 2020-03-04: qty 1

## 2020-03-04 MED ORDER — ACETAMINOPHEN 325 MG PO TABS: 325.0000 mg | ORAL_TABLET | Freq: Four times a day (QID) | ORAL | Status: DC | PRN

## 2020-03-04 MED ORDER — NITROGLYCERIN 0.4 MG SL SUBL: 0.4000 mg | SUBLINGUAL_TABLET | SUBLINGUAL | Status: DC | PRN

## 2020-03-04 MED ORDER — METOPROLOL SUCCINATE ER 25 MG PO TB24
12.5000 mg | ORAL_TABLET | Freq: Every day | ORAL | Status: DC
  Filled 2020-03-04: qty 1

## 2020-03-04 MED ORDER — BISACODYL 10 MG RE SUPP: 10.0000 mg | Freq: Every day | RECTAL | Status: DC | PRN

## 2020-03-04 MED ORDER — LACTATED RINGERS IV SOLN: INTRAVENOUS | Status: DC

## 2020-03-04 MED ORDER — MIDAZOLAM HCL 2 MG/2ML IJ SOLN: 0.5000 mg | Freq: Once | INTRAMUSCULAR | Status: DC | PRN

## 2020-03-04 MED ORDER — SODIUM CHLORIDE 0.9 % IV SOLN: INTRAVENOUS | Status: DC

## 2020-03-04 MED ORDER — HYDROCHLOROTHIAZIDE 12.5 MG PO CAPS
12.5000 mg | ORAL_CAPSULE | Freq: Every day | ORAL | Status: DC
  Administered 2020-03-05: 12.5 mg via ORAL
  Filled 2020-03-04: qty 1

## 2020-03-04 MED ORDER — PROMETHAZINE HCL 25 MG/ML IJ SOLN: 6.2500 mg | INTRAMUSCULAR | Status: DC | PRN

## 2020-03-04 MED ORDER — MORPHINE SULFATE (PF) 2 MG/ML IV SOLN
0.5000 mg | INTRAVENOUS | Status: DC | PRN
  Administered 2020-03-04 – 2020-03-05 (×4): 1 mg via INTRAVENOUS
  Filled 2020-03-04 (×4): qty 1

## 2020-03-04 MED ORDER — PHENYLEPHRINE 40 MCG/ML (10ML) SYRINGE FOR IV PUSH (FOR BLOOD PRESSURE SUPPORT)
PREFILLED_SYRINGE | INTRAVENOUS | Status: DC | PRN
  Administered 2020-03-04: 80 ug via INTRAVENOUS
  Administered 2020-03-04 (×3): 40 ug via INTRAVENOUS

## 2020-03-04 MED ORDER — FENTANYL CITRATE (PF) 100 MCG/2ML IJ SOLN
INTRAMUSCULAR | Status: AC
  Filled 2020-03-04: qty 2

## 2020-03-04 MED ORDER — DEXAMETHASONE SODIUM PHOSPHATE 10 MG/ML IJ SOLN
8.0000 mg | Freq: Once | INTRAMUSCULAR | Status: AC
  Administered 2020-03-04: 8 mg via INTRAVENOUS

## 2020-03-04 MED ORDER — BUPIVACAINE HCL (PF) 0.25 % IJ SOLN
INTRAMUSCULAR | Status: AC
  Filled 2020-03-04: qty 30

## 2020-03-04 MED ORDER — FENTANYL CITRATE (PF) 100 MCG/2ML IJ SOLN
INTRAMUSCULAR | Status: DC | PRN
  Administered 2020-03-04 (×2): 50 ug via INTRAVENOUS

## 2020-03-04 MED ORDER — ONDANSETRON HCL 4 MG/2ML IJ SOLN
INTRAMUSCULAR | Status: AC
  Filled 2020-03-04: qty 2

## 2020-03-04 MED ORDER — BUPIVACAINE IN DEXTROSE 0.75-8.25 % IT SOLN
INTRATHECAL | Status: DC | PRN
  Administered 2020-03-04: 2 mL via INTRATHECAL

## 2020-03-04 MED ORDER — PROPOFOL 10 MG/ML IV BOLUS
INTRAVENOUS | Status: AC
  Filled 2020-03-04: qty 40

## 2020-03-04 MED ORDER — ORAL CARE MOUTH RINSE: 15.0000 mL | Freq: Once | OROMUCOSAL | Status: AC

## 2020-03-04 MED ORDER — CEFAZOLIN SODIUM-DEXTROSE 2-4 GM/100ML-% IV SOLN
2.0000 g | Freq: Four times a day (QID) | INTRAVENOUS | Status: AC
  Administered 2020-03-04 (×2): 2 g via INTRAVENOUS
  Filled 2020-03-04 (×2): qty 100

## 2020-03-04 MED ORDER — MEPERIDINE HCL 50 MG/ML IJ SOLN: 6.2500 mg | INTRAMUSCULAR | Status: DC | PRN

## 2020-03-04 MED ORDER — CEFAZOLIN SODIUM-DEXTROSE 2-4 GM/100ML-% IV SOLN
2.0000 g | INTRAVENOUS | Status: AC
  Administered 2020-03-04: 2 g via INTRAVENOUS
  Filled 2020-03-04: qty 100

## 2020-03-04 MED ORDER — PROPOFOL 10 MG/ML IV BOLUS
INTRAVENOUS | Status: DC | PRN
  Administered 2020-03-04: 20 mg via INTRAVENOUS
  Administered 2020-03-04: 10 mg via INTRAVENOUS

## 2020-03-04 MED ORDER — PROPOFOL 500 MG/50ML IV EMUL
INTRAVENOUS | Status: DC | PRN
  Administered 2020-03-04: 75 ug/kg/min via INTRAVENOUS

## 2020-03-04 MED ORDER — PROPOFOL 1000 MG/100ML IV EMUL
INTRAVENOUS | Status: AC
  Filled 2020-03-04: qty 100

## 2020-03-04 MED ORDER — HYDROCODONE-ACETAMINOPHEN 5-325 MG PO TABS: 1.0000 | ORAL_TABLET | ORAL | Status: DC | PRN

## 2020-03-04 MED ORDER — MENTHOL 3 MG MT LOZG: 1.0000 | LOZENGE | OROMUCOSAL | Status: DC | PRN

## 2020-03-04 MED ORDER — TRANEXAMIC ACID-NACL 1000-0.7 MG/100ML-% IV SOLN
1000.0000 mg | INTRAVENOUS | Status: AC
  Administered 2020-03-04: 1000 mg via INTRAVENOUS
  Filled 2020-03-04: qty 100

## 2020-03-04 MED ORDER — HYDROMORPHONE HCL 1 MG/ML IJ SOLN
INTRAMUSCULAR | Status: AC
  Filled 2020-03-04: qty 1

## 2020-03-04 MED ORDER — BUPIVACAINE HCL 0.25 % IJ SOLN
INTRAMUSCULAR | Status: DC | PRN
  Administered 2020-03-04: 30 mL

## 2020-03-04 MED ORDER — METHOCARBAMOL 500 MG PO TABS
500.0000 mg | ORAL_TABLET | Freq: Four times a day (QID) | ORAL | Status: DC | PRN
  Administered 2020-03-04 – 2020-03-05 (×4): 500 mg via ORAL
  Filled 2020-03-04 (×4): qty 1

## 2020-03-04 MED ORDER — METHOCARBAMOL 500 MG IVPB - SIMPLE MED
500.0000 mg | Freq: Four times a day (QID) | INTRAVENOUS | Status: DC | PRN
  Administered 2020-03-04: 500 mg via INTRAVENOUS
  Filled 2020-03-04: qty 50

## 2020-03-04 MED ORDER — IRBESARTAN 150 MG PO TABS
150.0000 mg | ORAL_TABLET | Freq: Every day | ORAL | Status: DC
  Administered 2020-03-05: 150 mg via ORAL
  Filled 2020-03-04: qty 1

## 2020-03-04 MED ORDER — METOCLOPRAMIDE HCL 5 MG/ML IJ SOLN: 5.0000 mg | Freq: Three times a day (TID) | INTRAMUSCULAR | Status: DC | PRN

## 2020-03-04 MED ORDER — EPHEDRINE 5 MG/ML INJ
INTRAVENOUS | Status: AC
Start: 2020-03-04 — End: ?
  Filled 2020-03-04: qty 10

## 2020-03-04 MED ORDER — DEXAMETHASONE SODIUM PHOSPHATE 10 MG/ML IJ SOLN
10.0000 mg | Freq: Once | INTRAMUSCULAR | Status: AC
  Administered 2020-03-05: 10 mg via INTRAVENOUS
  Filled 2020-03-04: qty 1

## 2020-03-04 MED ORDER — METHOCARBAMOL 500 MG IVPB - SIMPLE MED
INTRAVENOUS | Status: AC
  Filled 2020-03-04: qty 50

## 2020-03-04 MED ORDER — CLOPIDOGREL BISULFATE 75 MG PO TABS
75.0000 mg | ORAL_TABLET | Freq: Every day | ORAL | Status: DC
  Administered 2020-03-05: 75 mg via ORAL
  Filled 2020-03-04: qty 1

## 2020-03-04 MED ORDER — EPHEDRINE SULFATE-NACL 50-0.9 MG/10ML-% IV SOSY
PREFILLED_SYRINGE | INTRAVENOUS | Status: DC | PRN
  Administered 2020-03-04 (×3): 10 mg via INTRAVENOUS

## 2020-03-04 MED ORDER — ACETAMINOPHEN 10 MG/ML IV SOLN
1000.0000 mg | Freq: Four times a day (QID) | INTRAVENOUS | Status: DC
  Administered 2020-03-04: 1000 mg via INTRAVENOUS
  Filled 2020-03-04: qty 100

## 2020-03-04 MED ORDER — METOCLOPRAMIDE HCL 5 MG PO TABS: 5.0000 mg | ORAL_TABLET | Freq: Three times a day (TID) | ORAL | Status: DC | PRN

## 2020-03-04 MED ORDER — ONDANSETRON HCL 4 MG PO TABS: 4.0000 mg | ORAL_TABLET | Freq: Four times a day (QID) | ORAL | Status: DC | PRN

## 2020-03-04 MED ORDER — MAGNESIUM CITRATE PO SOLN: 1.0000 | Freq: Once | ORAL | Status: DC | PRN

## 2020-03-04 MED ORDER — OXYCODONE HCL 5 MG PO TABS: 5.0000 mg | ORAL_TABLET | Freq: Once | ORAL | Status: DC | PRN

## 2020-03-04 MED ORDER — POVIDONE-IODINE 10 % EX SWAB
2.0000 "application " | Freq: Once | CUTANEOUS | Status: AC
  Administered 2020-03-04: 2 via TOPICAL

## 2020-03-04 MED ORDER — PHENYLEPHRINE 40 MCG/ML (10ML) SYRINGE FOR IV PUSH (FOR BLOOD PRESSURE SUPPORT)
PREFILLED_SYRINGE | INTRAVENOUS | Status: AC
  Filled 2020-03-04: qty 10

## 2020-03-04 MED ORDER — CHLORHEXIDINE GLUCONATE 0.12 % MT SOLN
15.0000 mL | Freq: Once | OROMUCOSAL | Status: AC
  Administered 2020-03-04: 15 mL via OROMUCOSAL

## 2020-03-04 MED ORDER — DEXAMETHASONE SODIUM PHOSPHATE 10 MG/ML IJ SOLN
INTRAMUSCULAR | Status: AC
  Filled 2020-03-04: qty 1

## 2020-03-04 MED ORDER — LATANOPROST 0.005 % OP SOLN
1.0000 [drp] | Freq: Every day | OPHTHALMIC | Status: DC
  Filled 2020-03-04: qty 2.5

## 2020-03-04 MED ORDER — POLYETHYLENE GLYCOL 3350 17 G PO PACK: 17.0000 g | PACK | Freq: Every day | ORAL | Status: DC | PRN

## 2020-03-04 MED ORDER — ONDANSETRON HCL 4 MG/2ML IJ SOLN: 4.0000 mg | Freq: Four times a day (QID) | INTRAMUSCULAR | Status: DC | PRN

## 2020-03-04 SURGICAL SUPPLY — 48 items
BAG DECANTER FOR FLEXI CONT (MISCELLANEOUS) IMPLANT
BAG SPEC THK2 15X12 ZIP CLS (MISCELLANEOUS)
BAG ZIPLOCK 12X15 (MISCELLANEOUS) IMPLANT
BLADE SAG 18X100X1.27 (BLADE) ×2 IMPLANT
CLSR STERI-STRIP ANTIMIC 1/2X4 (GAUZE/BANDAGES/DRESSINGS) ×1 IMPLANT
COVER PERINEAL POST (MISCELLANEOUS) ×2 IMPLANT
COVER SURGICAL LIGHT HANDLE (MISCELLANEOUS) ×2 IMPLANT
COVER WAND RF STERILE (DRAPES) IMPLANT
CUP ACETBLR 52 OD PINNACLE (Hips) ×1 IMPLANT
DECANTER SPIKE VIAL GLASS SM (MISCELLANEOUS) ×2 IMPLANT
DRAPE STERI IOBAN 125X83 (DRAPES) ×2 IMPLANT
DRAPE U-SHAPE 47X51 STRL (DRAPES) ×4 IMPLANT
DRESSING AQUACEL AG SP 3.5X10 (GAUZE/BANDAGES/DRESSINGS) IMPLANT
DRSG ADAPTIC 3X8 NADH LF (GAUZE/BANDAGES/DRESSINGS) ×2 IMPLANT
DRSG AQUACEL AG ADV 3.5X10 (GAUZE/BANDAGES/DRESSINGS) ×2 IMPLANT
DRSG AQUACEL AG SP 3.5X10 (GAUZE/BANDAGES/DRESSINGS) ×2
DURAPREP 26ML APPLICATOR (WOUND CARE) ×2 IMPLANT
ELECT REM PT RETURN 15FT ADLT (MISCELLANEOUS) ×2 IMPLANT
EVACUATOR 1/8 PVC DRAIN (DRAIN) IMPLANT
GLOVE BIO SURGEON STRL SZ 6 (GLOVE) IMPLANT
GLOVE BIO SURGEON STRL SZ7 (GLOVE) IMPLANT
GLOVE BIO SURGEON STRL SZ8 (GLOVE) ×2 IMPLANT
GLOVE BIOGEL PI IND STRL 6.5 (GLOVE) IMPLANT
GLOVE BIOGEL PI IND STRL 7.0 (GLOVE) IMPLANT
GLOVE BIOGEL PI IND STRL 8 (GLOVE) ×1 IMPLANT
GLOVE BIOGEL PI INDICATOR 6.5 (GLOVE)
GLOVE BIOGEL PI INDICATOR 7.0 (GLOVE)
GLOVE BIOGEL PI INDICATOR 8 (GLOVE) ×1
GOWN STRL REUS W/TWL LRG LVL3 (GOWN DISPOSABLE) ×2 IMPLANT
GOWN STRL REUS W/TWL XL LVL3 (GOWN DISPOSABLE) IMPLANT
HEAD FEMORAL 32 CERAMIC (Hips) ×1 IMPLANT
HOLDER FOLEY CATH W/STRAP (MISCELLANEOUS) ×2 IMPLANT
KIT TURNOVER KIT A (KITS) IMPLANT
LINER MARATHON NEUT +4X52X32 (Hips) ×1 IMPLANT
MANIFOLD NEPTUNE II (INSTRUMENTS) ×2 IMPLANT
PACK ANTERIOR HIP CUSTOM (KITS) ×2 IMPLANT
PENCIL SMOKE EVACUATOR COATED (MISCELLANEOUS) ×2 IMPLANT
STEM FEM ACTIS HIGH SZ8 (Stem) ×1 IMPLANT
STRIP CLOSURE SKIN 1/2X4 (GAUZE/BANDAGES/DRESSINGS) ×2 IMPLANT
SUT ETHIBOND NAB CT1 #1 30IN (SUTURE) ×2 IMPLANT
SUT MNCRL AB 4-0 PS2 18 (SUTURE) ×2 IMPLANT
SUT STRATAFIX 0 PDS 27 VIOLET (SUTURE) ×2
SUT VIC AB 2-0 CT1 27 (SUTURE) ×4
SUT VIC AB 2-0 CT1 TAPERPNT 27 (SUTURE) ×2 IMPLANT
SUTURE STRATFX 0 PDS 27 VIOLET (SUTURE) ×1 IMPLANT
SYR 50ML LL SCALE MARK (SYRINGE) IMPLANT
TRAY FOLEY MTR SLVR 16FR STAT (SET/KITS/TRAYS/PACK) ×2 IMPLANT
YANKAUER SUCT BULB TIP 10FT TU (MISCELLANEOUS) ×2 IMPLANT

## 2020-03-04 NOTE — Op Note (Signed)
OPERATIVE REPORT- TOTAL HIP ARTHROPLASTY   PREOPERATIVE DIAGNOSIS: Osteoarthritis of the Left hip.   POSTOPERATIVE DIAGNOSIS: Osteoarthritis of the Left  hip.   PROCEDURE: Left total hip arthroplasty, anterior approach.   SURGEON: Gaynelle Arabian, MD   ASSISTANT: Griffith Citron, PA-C  ANESTHESIA:  Spinal  ESTIMATED BLOOD LOSS:-250 mL    DRAINS: Hemovac x1.   COMPLICATIONS: None   CONDITION: PACU - hemodynamically stable.   BRIEF CLINICAL NOTE: Luis Guzman is a 74 y.o. male who has advanced end-  stage arthritis of their Left  hip with progressively worsening pain and  dysfunction.The patient has failed nonoperative management and presents for  total hip arthroplasty.   PROCEDURE IN DETAIL: After successful administration of spinal  anesthetic, the traction boots for the [facility] bed were placed on both  feet and the patient was placed onto the [facility] bed, boots placed into the leg  holders. The Left hip was then isolated from the perineum with plastic  drapes and prepped and draped in the usual sterile fashion. ASIS and  greater trochanter were marked and a oblique incision was made, starting  at about 1 cm lateral and 2 cm distal to the ASIS and coursing towards  the anterior cortex of the femur. The skin was cut with a 10 blade  through subcutaneous tissue to the level of the fascia overlying the  tensor fascia lata muscle. The fascia was then incised in line with the  incision at the junction of the anterior third and posterior 2/3rd. The  muscle was teased off the fascia and then the interval between the TFL  and the rectus was developed. The Hohmann retractor was then placed at  the top of the femoral neck over the capsule. The vessels overlying the  capsule were cauterized and the fat on top of the capsule was removed.  A Hohmann retractor was then placed anterior underneath the rectus  femoris to give exposure to the entire anterior capsule. A T-shaped  capsulotomy  was performed. The edges were tagged and the femoral head  was identified.       Osteophytes are removed off the superior acetabulum.  The femoral neck was then cut in situ with an oscillating saw. Traction  was then applied to the left lower extremity utilizing the [facility]  traction. The femoral head was then removed. Retractors were placed  around the acetabulum and then circumferential removal of the labrum was  performed. Osteophytes were also removed. Reaming starts at 49 mm to  medialize and  Increased in 2 mm increments to 51 mm. We reamed in  approximately 40 degrees of abduction, 20 degrees anteversion. A 52 mm  pinnacle acetabular shell was then impacted in anatomic position under  fluoroscopic guidance with excellent purchase. We did not need to place  any additional dome screws. A 32 mm neutral + 4 marathon liner was then  placed into the acetabular shell.       The femoral lift was then placed along the lateral aspect of the femur  just distal to the vastus ridge. The leg was  externally rotated and capsule  was stripped off the inferior aspect of the femoral neck down to the  level of the lesser trochanter, this was done with electrocautery. The femur was lifted after this was performed. The  leg was then placed in an extended and adducted position essentially delivering the femur. We also removed the capsule superiorly and the piriformis from the piriformis  fossa to gain excellent exposure of the  proximal femur. Rongeur was used to remove some cancellous bone to get  into the lateral portion of the proximal femur for placement of the  initial starter reamer. The starter broaches was placed  the starter broach  and was shown to go down the center of the canal. Broaching  with the Actis system was then performed starting at size 0  coursing  Up to size 8. A size 8 had excellent torsional and rotational  and axial stability. The trial high offset neck was then placed  with a 32 + 1  trial head. The hip was then reduced. We confirmed that  the stem was in the canal both on AP and lateral x-rays. It also has excellent sizing. The hip was reduced with outstanding stability through full extension and full external rotation.. AP pelvis was taken and the leg lengths were measured and found to be equal. Hip was then dislocated again and the femoral head and neck removed. The  femoral broach was removed. Size 8 Actis stem with a high offset  neck was then impacted into the femur following native anteversion. Has  excellent purchase in the canal. Excellent torsional and rotational and  axial stability. It is confirmed to be in the canal on AP and lateral  fluoroscopic views. The 32 + 1 ceramic head was placed and the hip  reduced with outstanding stability. Again AP pelvis was taken and it  confirmed that the leg lengths were equal. The wound was then copiously  irrigated with saline solution and the capsule reattached and repaired  with Ethibond suture. 30 ml of .25% Bupivicaine was  injected into the capsule and into the edge of the tensor fascia lata as well as subcutaneous tissue. The fascia overlying the tensor fascia lata was then closed with a running #1 V-Loc. Subcu was closed with interrupted 2-0 Vicryl and subcuticular running 4-0 Monocryl. Incision was cleaned  and dried. Steri-Strips and a bulky sterile dressing applied. Hemovac  drain was hooked to suction and then the patient was awakened and transported to  recovery in stable condition.        Please note that a surgical assistant was a medical necessity for this procedure to perform it in a safe and expeditious manner. Assistant was necessary to provide appropriate retraction of vital neurovascular structures and to prevent femoral fracture and allow for anatomic placement of the prosthesis.  Gaynelle Arabian, M.D.

## 2020-03-04 NOTE — Anesthesia Procedure Notes (Signed)
Procedure Name: MAC Performed by: Deloyce Walthers L, CRNA Pre-anesthesia Checklist: Patient identified, Emergency Drugs available, Suction available, Patient being monitored and Timeout performed Patient Re-evaluated:Patient Re-evaluated prior to induction Oxygen Delivery Method: Simple face mask Preoxygenation: Pre-oxygenation with 100% oxygen Induction Type: IV induction Placement Confirmation: positive ETCO2 Dental Injury: Teeth and Oropharynx as per pre-operative assessment        

## 2020-03-04 NOTE — Discharge Instructions (Addendum)
Luis Arabian, MD Total Joint Specialist [facility] [address] [place], [place] [address] [phone]  ANTERIOR APPROACH TOTAL HIP REPLACEMENT POSTOPERATIVE DIRECTIONS     Hip Rehabilitation, Guidelines Following Surgery  The results of a hip operation are greatly improved after range of motion and muscle strengthening exercises. Follow all safety measures which are given to protect your hip. If any of these exercises cause increased pain or swelling in your joint, decrease the amount until you are comfortable again. Then slowly increase the exercises. Call your caregiver if you have problems or questions.   BLOOD CLOT PREVENTION . Take a 325 mg Aspirin once a day for three weeks following surgery with your usual Plavix dose. Then resume one 81 mg Aspirin once a day with your usual Plavix dose. Luis Guzman may resume your vitamins/supplements upon discharge from the hospital.  [place]  . Remove items at home which could result in a fall. This includes throw rugs or furniture in walking pathways.   ICE to the affected hip as frequently as 20-30 minutes an hour and then as needed for pain and swelling. Continue to use ice on the hip for pain and swelling from surgery. You may notice swelling that will progress down to the foot and ankle. This is normal after surgery. Elevate the leg when you are not up walking on it.    Continue to use the breathing machine which will help keep your temperature down.  It is common for your temperature to cycle up and down following surgery, especially at night when you are not up moving around and exerting yourself.  The breathing machine keeps your lungs expanded and your temperature down.  DIET You may resume your previous home diet once your are discharged from the hospital.  DRESSING / WOUND CARE / SHOWERING . You have an adhesive waterproof bandage over the incision. Leave this in place until your first  follow-up appointment. Once you remove this you will not need to place another bandage.  . You may begin showering 3 days following surgery, but do not submerge the incision under water.  ACTIVITY . For the first 3-5 days, it is important to rest and keep the operative leg elevated. You should, as a general rule, rest for 50 minutes and walk/stretch for 10 minutes per hour. After 5 days, you may slowly increase activity as tolerated.  [name] Perform the exercises you were provided twice a day for about 15-20 minutes each session. Begin these 2 days following surgery. . Walk with your walker as instructed. Use the walker until you are comfortable transitioning to a cane. Walk with the cane in the opposite hand of the operative leg. You may discontinue the cane once you are comfortable and walking steadily. . Avoid periods of inactivity such as sitting longer than an hour when not asleep. This helps prevent blood clots.  . Do not drive a car for 6 weeks or until released by your surgeon.  . Do not drive while taking narcotics.  TED HOSE STOCKINGS Wear the elastic stockings on both legs for three weeks following surgery during the day. You may remove them at night while sleeping.  WEIGHT BEARING Weight bearing as tolerated with assist device (walker, cane, etc) as directed, use it as long as suggested by your surgeon or therapist, typically at least 4-6 weeks.  POSTOPERATIVE CONSTIPATION PROTOCOL Constipation - defined medically as fewer than three stools per week and severe constipation as less than  one stool per week.  One of the most common issues patients have following surgery is constipation.  Even if you have a regular bowel pattern at home, your normal regimen is likely to be disrupted due to multiple reasons following surgery.  Combination of anesthesia, postoperative narcotics, change in appetite and fluid intake all can affect your bowels.  In order to avoid complications following surgery,  here are some recommendations in order to help you during your recovery period.  . Colace (docusate) - Pick up an over-the-counter form of Colace or another stool softener and take twice a day as long as you are requiring postoperative pain medications.  Take with a full glass of water daily.  If you experience loose stools or diarrhea, hold the colace until you stool forms back up.  If your symptoms do not get better within 1 week or if they get worse, check with your doctor. . Dulcolax (bisacodyl) - Pick up over-the-counter and take as directed by the product packaging as needed to assist with the movement of your bowels.  Take with a full glass of water.  Use this product as needed if not relieved by Colace only.  . MiraLax (polyethylene glycol) - Pick up over-the-counter to have on hand.  MiraLax is a solution that will increase the amount of water in your bowels to assist with bowel movements.  Take as directed and can mix with a glass of water, juice, soda, coffee, or tea.  Take if you go more than two days without a movement.Do not use MiraLax more than once per day. Call your doctor if you are still constipated or irregular after using this medication for 7 days in a row.  If you continue to have problems with postoperative constipation, please contact the office for further assistance and recommendations.  If you experience "the worst abdominal pain ever" or develop nausea or vomiting, please contact the office immediatly for further recommendations for treatment.  ITCHING  If you experience itching with your medications, try taking only a single pain pill, or even half a pain pill at a time.  You can also use Benadryl over the counter for itching or also to help with sleep.   MEDICATIONS See your medication summary on the "After Visit Summary" that the nursing staff will review with you prior to discharge.  You may have some home medications which will be placed on hold until you complete the  course of blood thinner medication.  It is important for you to complete the blood thinner medication as prescribed by your surgeon.  Continue your approved medications as instructed at time of discharge.  PRECAUTIONS If you experience chest pain or shortness of breath - call 911 immediately for transfer to the hospital emergency department.  If you develop a fever greater that 101 F, purulent drainage from wound, increased redness or drainage from wound, foul odor from the wound/dressing, or calf pain - CONTACT YOUR SURGEON.                                                   FOLLOW-UP APPOINTMENTS Make sure you keep all of your appointments after your operation with your surgeon and caregivers. You should call the office at the above phone number and make an appointment for approximately two weeks after the date of your surgery or on  the date instructed by your surgeon outlined in the "After Visit Summary".  RANGE OF MOTION AND STRENGTHENING EXERCISES  These exercises are designed to help you keep full movement of your hip joint. Follow your caregiver's or physical therapist's instructions. Perform all exercises about fifteen times, three times per day or as directed. Exercise both hips, even if you have had only one joint replacement. These exercises can be done on a training (exercise) mat, on the floor, on a table or on a bed. Use whatever works the best and is most comfortable for you. Use music or television while you are exercising so that the exercises are a pleasant break in your day. This will make your life better with the exercises acting as a break in routine you can look forward to.  . Lying on your back, slowly slide your foot toward your buttocks, raising your knee up off the floor. Then slowly slide your foot back down until your leg is straight again.  . Lying on your back spread your legs as far apart as you can without causing discomfort.  . Lying on your side, raise your upper leg  and foot straight up from the floor as far as is comfortable. Slowly lower the leg and repeat.  . Lying on your back, tighten up the muscle in the front of your thigh (quadriceps muscles). You can do this by keeping your leg straight and trying to raise your heel off the floor. This helps strengthen the largest muscle supporting your knee.  . Lying on your back, tighten up the muscles of your buttocks both with the legs straight and with the knee bent at a comfortable angle while keeping your heel on the floor.   IF YOU ARE TRANSFERRED TO A SKILLED REHAB FACILITY If the patient is transferred to a skilled rehab facility following release from the hospital, a list of the current medications will be sent to the facility for the patient to continue.  When discharged from the skilled rehab facility, please have the facility set up the patient's [place] prior to being released. Also, the skilled facility will be responsible for providing the patient with their medications at time of release from the facility to include their pain medication, the muscle relaxants, and their blood thinner medication. If the patient is still at the rehab facility at time of the two week follow up appointment, the skilled rehab facility will also need to assist the patient in arranging follow up appointment in our office and any transportation needs.  MAKE SURE YOU:  . Understand these instructions.  . Get help right away if you are not doing well or get worse.    DENTAL ANTIBIOTICS:  In most cases prophylactic antibiotics for Dental procdeures after total joint surgery are not necessary.  Exceptions are as follows:  1. History of prior total joint infection  2. Severely immunocompromised (Organ Transplant, cancer chemotherapy, Rheumatoid biologic meds such as [place])  3. Poorly controlled diabetes (A1C &gt; 8.0, blood glucose over 200)  If you have one of these conditions, contact your surgeon  for an antibiotic prescription, prior to your dental procedure.    Pick up stool softner and laxative for home use following surgery while on pain medications. Do not submerge incision under water. Please use good hand washing techniques while changing dressing each day. May shower starting three days after surgery. Please use a clean towel to pat the incision dry following showers. Continue to use ice  for pain and swelling after surgery. Do not use any lotions or creams on the incision until instructed by your surgeon.

## 2020-03-04 NOTE — Anesthesia Procedure Notes (Signed)
Spinal  Patient location during procedure: OR Start time: 03/04/2020 8:28 AM End time: 03/04/2020 8:32 AM Staffing Performed: resident/CRNA  Anesthesiologist: Annye Asa, MD Resident/CRNA: West Pugh, CRNA Preanesthetic Checklist Completed: patient identified, IV checked, site marked, risks and benefits discussed, surgical consent, monitors and equipment checked, pre-op evaluation and timeout performed Spinal Block Patient position: sitting Prep: DuraPrep and site prepped and draped Patient monitoring: heart rate, continuous pulse ox and blood pressure Approach: midline Location: L3-4 Injection technique: single-shot Needle Needle type: Pencan and Introducer  Needle gauge: 24 G Needle length: 10 cm Assessment Sensory level: T4 Additional Notes IV functioning, monitors applied to pt. Expiration date of kit checked and confirmed to be in date. Sterile prep and drape, hand hygiene and sterile gloved used. Pt was positioned and spine was prepped in sterile fashion. Skin was anesthetized with lidocaine. Free flow of clear CSF obtained prior to injecting local anesthetic into CSF . Spinal needle aspirated freely following injection. Needle was carefully withdrawn, and pt tolerated procedure well. Loss of motor and sensory on exam post injection.

## 2020-03-04 NOTE — Anesthesia Postprocedure Evaluation (Signed)
Anesthesia Post Note  Patient: BONIFACIO PRUDEN  Procedure(s) Performed: TOTAL HIP ARTHROPLASTY ANTERIOR APPROACH (Left Hip)     Patient location during evaluation: PACU Anesthesia Type: Spinal Level of consciousness: awake and alert, oriented and patient cooperative Pain management: pain level controlled Vital Signs Assessment: post-procedure vital signs reviewed and stable Respiratory status: spontaneous breathing, nonlabored ventilation and respiratory function stable Cardiovascular status: blood pressure returned to baseline and stable Postop Assessment: no apparent nausea or vomiting and spinal receding Anesthetic complications: no   No complications documented.  Last Vitals:  Vitals:   03/04/20 1100 03/04/20 1139  BP: 130/74 137/76  Pulse: (!) 53 (!) 59  Resp: 13 16  Temp:  (!) 36.4 C  SpO2: 100% 100%    Last Pain:  Vitals:   03/04/20 1139  TempSrc: Oral  PainSc:         RLE Motor Response: Purposeful movement (03/04/20 1135) RLE Sensation: Numbness (03/04/20 1135)      Malonie Tatum,E. Lachanda Buczek

## 2020-03-04 NOTE — Evaluation (Signed)
Physical Therapy Evaluation Patient Details Name: Luis Guzman MRN: [id number] DOB: [birth year] Today's Date: 03/04/2020   History of Present Illness  Patient is 74 yo. male s/p Lt THA anterior approach on 03/04/20 with PMH significant for OA, HTN, HLD, GERD, CAD, AAA, anemia, C4, 5, 6 surgery.     Clinical Impression  Luis Guzman is a 74 y.o. male POD 0 s/p Lt THA. Patient reports independence with mobility at baseline. Patient is now limited by functional impairments (see PT problem list below) and requires min assist for bed mob and transfers with RW. Patient was limited today by pain and completed stand step transfer with RW to move to recliner for exercises. Patient instructed in exercise to facilitate ROM/strength and circulation. Patient will benefit from continued skilled PT interventions to address impairments and progress towards PLOF. Acute PT will follow to progress mobility and stair training in preparation for safe discharge home.     Follow Up Recommendations Follow surgeon's recommendation for DC plan and follow-up therapies    Equipment Recommendations  Rolling walker with 5" wheels;3in1 (PT)    Recommendations for Other Services       Precautions / Restrictions Precautions Precautions: Fall Restrictions Weight Bearing Restrictions: No Other Position/Activity Restrictions: WBAT      Mobility  Bed Mobility Overal bed mobility: Needs Assistance Bed Mobility: Supine to Sit     Supine to sit: Min assist;HOB elevated     General bed mobility comments: cues to use bed rail and assist to bring Lt LE to EOB, assist to raise trunk.  Transfers Overall transfer level: Needs assistance Equipment used: Rolling walker (2 wheeled) Transfers: Sit to/from [facility] Sit to Stand: Min assist;From elevated surface Stand pivot transfers: Min assist       General transfer comment: VC's for technique with RW, assist for power up and to steady with rising. pt  avoiding WB on Lt LE initially. Cues for sequencing stand step transfer to recliner and assist for walker positioning.   Ambulation/Gait           Stairs            Wheelchair Mobility    Modified Rankin (Stroke Patients Only)       Balance Overall balance assessment: Needs assistance Sitting-balance support: Feet supported Sitting balance-Leahy Scale: Good     Standing balance support: During functional activity;Bilateral upper extremity supported Standing balance-Leahy Scale: Poor          Pertinent Vitals/Pain Pain Assessment: 0-10 Pain Score: 8  Pain Location: Lt hip Pain Descriptors / Indicators: Aching;Discomfort;Sore Pain Intervention(s): Limited activity within patient's tolerance;Monitored during session;Repositioned;Ice applied    Home Living Family/patient expects to be discharged to:: Private residence Living Arrangements: Spouse/significant other Available Help at Discharge: Family Type of Home: House Home Access: Stairs to enter Entrance Stairs-Rails: None Technical brewer of Steps: 2 Home Layout: One level Home Equipment: None      Prior Function Level of Independence: Independent               Hand Dominance   Dominant Hand: Right    Extremity/Trunk Assessment   Upper Extremity Assessment Upper Extremity Assessment: Overall WFL for tasks assessed    Lower Extremity Assessment Lower Extremity Assessment: Overall WFL for tasks assessed;LLE deficits/detail LLE Deficits / Details: good quad activation LLE: Unable to fully assess due to pain LLE Sensation: WNL LLE Coordination: WNL    Cervical / Trunk Assessment Cervical / Trunk Assessment: Normal  Communication  Communication: No difficulties  Cognition Arousal/Alertness: Awake/alert Behavior During Therapy: WFL for tasks assessed/performed Overall Cognitive Status: Within Functional Limits for tasks assessed                 General Comments       Exercises Total Joint Exercises Ankle Circles/Pumps: AROM;Both;20 reps;Seated Quad Sets: AROM;Left;10 reps;Seated   Assessment/Plan    PT Assessment Patient needs continued PT services  PT Problem List Decreased strength;Decreased range of motion;Decreased activity tolerance;Decreased balance;Decreased mobility;Decreased knowledge of use of DME;Pain       PT Treatment Interventions DME instruction;Gait training;Stair training;Functional mobility training;Therapeutic activities;Therapeutic exercise;Balance training;Patient/family education    PT Goals (Current goals can be found in the Care Plan section)  Acute Rehab PT Goals Patient Stated Goal: get back to walking, cooking, and playing with grandkids PT Goal Formulation: With patient Time For Goal Achievement: 03/11/20 Potential to Achieve Goals: Good    Frequency 7X/week    AM-PAC PT "6 Clicks" Mobility  Outcome Measure Help needed turning from your back to your side while in a flat bed without using bedrails?: A Little Help needed moving from lying on your back to sitting on the side of a flat bed without using bedrails?: A Little Help needed moving to and from a bed to a chair (including a wheelchair)?: A Little Help needed standing up from a chair using your arms (e.g., wheelchair or bedside chair)?: A Little Help needed to walk in hospital room?: A Little Help needed climbing 3-5 steps with a railing? : A Lot 6 Click Score: 17    End of Session Equipment Utilized During Treatment: Gait belt Activity Tolerance: Patient tolerated treatment well;Patient limited by pain Patient left: in chair;with call bell/phone within reach;with chair alarm set;with family/visitor present Nurse Communication: Mobility status PT Visit Diagnosis: Muscle weakness (generalized) (M62.81);Difficulty in walking, not elsewhere classified (R26.2)    Time: 1413-1440 PT Time Calculation (min) (ACUTE ONLY): 27 min   Charges:   PT  Evaluation $PT Eval Low Complexity: 1 Low PT Treatments $Therapeutic Exercise: 8-22 mins        Verner Mould, DPT Acute Rehabilitation Services  Office [phone] Pager [phone]  03/04/2020 3:00 PM

## 2020-03-04 NOTE — Interval H&P Note (Signed)
History and Physical Interval Note:  03/04/2020 8:16 AM  Luis Guzman  has presented today for surgery, with the diagnosis of left hip osteoarthritis.  The various methods of treatment have been discussed with the patient and family. After consideration of risks, benefits and other options for treatment, the patient has consented to  Procedure(s) with comments: [place] (Left) - 187min as a surgical intervention.  The patient's history has been reviewed, patient examined, no change in status, stable for surgery.  I have reviewed the patient's chart and labs.  Questions were answered to the patient's satisfaction.     [name]

## 2020-03-04 NOTE — Transfer of Care (Signed)
Immediate Anesthesia Transfer of Care Note  Patient: Luis Guzman  Procedure(s) Performed: TOTAL HIP ARTHROPLASTY ANTERIOR APPROACH (Left Hip)  Patient Location: PACU  Anesthesia Type:MAC and Spinal  Level of Consciousness: awake, alert  and patient cooperative  Airway & Oxygen Therapy: Patient Spontanous Breathing and Patient connected to face mask oxygen  Post-op Assessment: Report given to RN and Post -op Vital signs reviewed and stable  Post vital signs: Reviewed and stable  Last Vitals:  Vitals Value Taken Time  BP 109/61 03/04/20 1000  Temp    Pulse 64 03/04/20 1001  Resp 12 03/04/20 1001  SpO2 99 % 03/04/20 1001  Vitals shown include unvalidated device data.  Last Pain:  Vitals:   03/04/20 0723  TempSrc: Oral  PainSc:          Complications: No complications documented.

## 2020-03-05 DIAGNOSIS — I25111 Atherosclerotic heart disease of native coronary artery with angina pectoris with documented spasm: Secondary | ICD-10-CM | POA: Diagnosis not present

## 2020-03-05 DIAGNOSIS — E119 Type 2 diabetes mellitus without complications: Secondary | ICD-10-CM | POA: Diagnosis not present

## 2020-03-05 DIAGNOSIS — Z7982 Long term (current) use of aspirin: Secondary | ICD-10-CM | POA: Diagnosis not present

## 2020-03-05 DIAGNOSIS — I119 Hypertensive heart disease without heart failure: Secondary | ICD-10-CM | POA: Diagnosis not present

## 2020-03-05 DIAGNOSIS — Z79899 Other long term (current) drug therapy: Secondary | ICD-10-CM | POA: Diagnosis not present

## 2020-03-05 DIAGNOSIS — M1612 Unilateral primary osteoarthritis, left hip: Secondary | ICD-10-CM | POA: Diagnosis not present

## 2020-03-05 LAB — CBC
HCT: 39.8 % (ref 39.0–52.0)
Hemoglobin: 13.4 g/dL (ref 13.0–17.0)
MCH: 31.2 pg (ref 26.0–34.0)
MCHC: 33.7 g/dL (ref 30.0–36.0)
MCV: 92.6 fL (ref 80.0–100.0)
Platelets: 178 10*3/uL (ref 150–400)
RBC: 4.3 MIL/uL (ref 4.22–5.81)
RDW: 12.4 % (ref 11.5–15.5)
WBC: 18.5 10*3/uL — ABNORMAL HIGH (ref 4.0–10.5)
nRBC: 0 % (ref 0.0–0.2)

## 2020-03-05 LAB — BASIC METABOLIC PANEL
Anion gap: 9 (ref 5–15)
BUN: 14 mg/dL (ref 8–23)
CO2: 24 mmol/L (ref 22–32)
Calcium: 8.6 mg/dL — ABNORMAL LOW (ref 8.9–10.3)
Chloride: 105 mmol/L (ref 98–111)
Creatinine, Ser: 0.76 mg/dL (ref 0.61–1.24)
GFR calc Af Amer: >60 mL/min (ref >60)
GFR calc non Af Amer: >60 mL/min (ref >60)
Glucose, Bld: 161 mg/dL — ABNORMAL HIGH (ref 70–99)
Potassium: 3.7 mmol/L (ref 3.5–5.1)
Sodium: 138 mmol/L (ref 135–145)

## 2020-03-05 MED ORDER — HYDROCODONE-ACETAMINOPHEN 5-325 MG PO TABS: 1.0000 | ORAL_TABLET | Freq: Four times a day (QID) | ORAL | 0 refills | Status: DC | PRN

## 2020-03-05 MED ORDER — METHOCARBAMOL 500 MG PO TABS: 500.0000 mg | ORAL_TABLET | Freq: Four times a day (QID) | ORAL | 0 refills | Status: DC | PRN

## 2020-03-05 MED ORDER — ASPIRIN 325 MG PO TBEC: 325.0000 mg | DELAYED_RELEASE_TABLET | Freq: Every day | ORAL | 0 refills | Status: AC

## 2020-03-05 NOTE — Progress Notes (Signed)
Physical Therapy Treatment Patient Details Name: Luis Guzman MRN: [id number] DOB: 04-30-1946 Today's Date: 03/05/2020    History of Present Illness Patient is 74 yo. male s/p Lt THA anterior approach on 03/04/20 with PMH significant for OA, HTN, HLD, GERD, CAD, AAA, anemia, C4, 5, 6 surgery.     PT Comments    Patient progressing well with mobility and pain more controlled today. Patient required min assist to complete sit<>stand transfer today from lower surface and demonstrated good carryover for safe technique. He was bel to advance gait training today to ambulate ~ 180 feet with RW and pt's wife provided safe guarding technique during mobility. Patient was able to initiate stair training with wife assisting for walker management. No overt LOB noted and good sequencing with cues from PT. Pt instructed on exercises for HEP. Will benefit from additional PM session to finalize HEP and complete mobility training with family.     Follow Up Recommendations  Follow surgeon's recommendation for DC plan and follow-up therapies     Equipment Recommendations  Rolling walker with 5" wheels;3in1 (PT)    Recommendations for Other Services       Precautions / Restrictions Precautions Precautions: Fall Restrictions Weight Bearing Restrictions: No Other Position/Activity Restrictions: WBAT    Mobility  Bed Mobility Overal bed mobility: Needs Assistance Bed Mobility: Supine to Sit     Supine to sit: HOB elevated;Min guard     General bed mobility comments: no assist required to transfer EOB. pt using gait belt for Lt LE mobility.   Transfers Overall transfer level: Needs assistance Equipment used: Rolling walker (2 wheeled) Transfers: Sit to/from Stand Sit to Stand: Min assist;Min guard         General transfer comment: pt with good carryover for hand placement, min guard to initiate power up and light assis to complete rise to stand.   Ambulation/Gait Ambulation/Gait  assistance: Min guard;Supervision Gait Distance (Feet): 180 Feet Assistive device: Rolling walker (2 wheeled) Gait Pattern/deviations: Step-to pattern;Decreased step length - left;Decreased stance time - left;Decreased stride length;Decreased weight shift to left Gait velocity: decr   General Gait Details: cues for safe step pattern and proximity to RW. no overt LOB noted. pt's wife present and provided safe guarding technique with VC's from therapist.    Stairs Stairs: Yes Stairs assistance: Min guard;Supervision Stair Management: No rails;Step to pattern;Backwards;With walker Number of Stairs: 4 (2x2) General stair comments: VC's for safe step to pattern "up with good, down with bad" no overt LOB noted with stair mobility. pt's wife provided safe assist to stabilize RW wtih cues from therapist.    Wheelchair Mobility    Modified Rankin (Stroke Patients Only)       Balance Overall balance assessment: Needs assistance Sitting-balance support: Feet supported Sitting balance-Leahy Scale: Good     Standing balance support: During functional activity;Bilateral upper extremity supported Standing balance-Leahy Scale: Fair           Cognition Arousal/Alertness: Awake/alert Behavior During Therapy: WFL for tasks assessed/performed Overall Cognitive Status: Within Functional Limits for tasks assessed               Exercises Total Joint Exercises Ankle Circles/Pumps: AROM;Both;Seated;10 reps Quad Sets: AROM;Left;10 reps;Seated Short Arc Quad: AROM;Left;10 reps;Seated Heel Slides: AROM;Left;10 reps;Seated Hip ABduction/ADduction: AROM;Left;10 reps;Seated    General Comments        Pertinent Vitals/Pain Pain Assessment: 0-10 Pain Location: Lt hip Pain Descriptors / Indicators: Aching;Discomfort;Sore Pain Intervention(s): Limited activity within patient's tolerance;Monitored during session;Repositioned;Ice applied;Premedicated  before session    PT Goals (current  goals can now be found in the care plan section) Acute Rehab PT Goals Patient Stated Goal: get back to walking, cooking, and playing with grandkids PT Goal Formulation: With patient Time For Goal Achievement: 03/11/20 Potential to Achieve Goals: Good Progress towards PT goals: Progressing toward goals    Frequency    7X/week      PT Plan Current plan remains appropriate    Co-evaluation              AM-PAC PT "6 Clicks" Mobility   Outcome Measure  Help needed turning from your back to your side while in a flat bed without using bedrails?: A Little Help needed moving from lying on your back to sitting on the side of a flat bed without using bedrails?: A Little Help needed moving to and from a bed to a chair (including a wheelchair)?: A Little Help needed standing up from a chair using your arms (e.g., wheelchair or bedside chair)?: A Little Help needed to walk in hospital room?: A Little Help needed climbing 3-5 steps with a railing? : A Lot 6 Click Score: 17    End of Session Equipment Utilized During Treatment: Gait belt Activity Tolerance: Patient tolerated treatment well;Patient limited by pain Patient left: in chair;with call bell/phone within reach;with chair alarm set;with family/visitor present Nurse Communication: Mobility status PT Visit Diagnosis: Muscle weakness (generalized) (M62.81);Difficulty in walking, not elsewhere classified (R26.2)     Time: 7017-7939 PT Time Calculation (min) (ACUTE ONLY): 40 min  Charges:  $Gait Training: 23-37 mins $Therapeutic Exercise: 8-22 mins                     Verner Mould, DPT Acute Rehabilitation Services  Office [phone] Pager [phone]  03/05/2020 10:42 AM

## 2020-03-05 NOTE — Progress Notes (Signed)
Physical Therapy Treatment Patient Details Name: Luis Guzman MRN: [id number] DOB: [birth year] Today's Date: 03/05/2020    History of Present Illness Patient is 74 yo. male s/p Lt THA anterior approach on 03/04/20 with PMH significant for OA, HTN, HLD, GERD, CAD, AAA, anemia, C4, 5, 6 surgery.     PT Comments    Patient continues to progress well with mobility. Good carryover for safe management of RW and safe stair sequencing. Pt/wife demonstrated good communication and pt's wife competent with safe guarding/assist. Pt educated on car transfer and final exercises for HEP. He is safe for discharge home with assist from wife. Acute will follow during stay to progress mobility as able.   Follow Up Recommendations  Follow surgeon's recommendation for DC plan and follow-up therapies     Equipment Recommendations  Rolling walker with 5" wheels;3in1 (PT)    Recommendations for Other Services       Precautions / Restrictions Precautions Precautions: Fall Restrictions Weight Bearing Restrictions: No Other Position/Activity Restrictions: WBAT    Mobility  Bed Mobility Overal bed mobility: Needs Assistance Bed Mobility: Sit to Supine     Supine to sit: HOB elevated;Min guard Sit to supine: Supervision;Min guard   General bed mobility comments: pt in recliner at start of session. pt able to use gait belt to raise Lt LE into bed and return to supine. some assist from pt's wife.   Transfers Overall transfer level: Needs assistance Equipment used: Rolling walker (2 wheeled) Transfers: Sit to/from Stand Sit to Stand: Supervision         General transfer comment: good carryover for technique, pt's wife assisting with rise form recliner and sit to EOB.  Ambulation/Gait Ambulation/Gait assistance: Min guard;Supervision Gait Distance (Feet): 75 Feet Assistive device: Rolling walker (2 wheeled) Gait Pattern/deviations: Step-to pattern;Decreased step length - left;Decreased stance  time - left;Decreased stride length;Decreased weight shift to left Gait velocity: fair   General Gait Details: pt with good carryover for safe use of RW and step pattern. pt's wife demonstrated safe guarding technique. no overt LOB.   Stairs Stairs: Yes Stairs assistance: Min guard;Supervision Stair Management: No rails;Step to pattern;Backwards;With walker Number of Stairs: 2 General stair comments: good carryover for step sequencnig and guarding/assist from pt's wife. no cues needed.   Wheelchair Mobility    Modified Rankin (Stroke Patients Only)       Balance Overall balance assessment: Needs assistance Sitting-balance support: Feet supported Sitting balance-Leahy Scale: Good     Standing balance support: During functional activity;Bilateral upper extremity supported Standing balance-Leahy Scale: Fair             Cognition Arousal/Alertness: Awake/alert Behavior During Therapy: WFL for tasks assessed/performed Overall Cognitive Status: Within Functional Limits for tasks assessed               Exercises Total Joint Exercises Ankle Circles/Pumps: AROM;Both;Seated;10 reps Quad Sets: AROM;Left;10 reps;Seated Short Arc Quad: AROM;Left;10 reps;Seated Heel Slides: AROM;Left;10 reps;Seated Hip ABduction/ADduction: AROM;Left;10 reps;Seated Long Arc Quad: AROM;Left;10 reps;Seated    General Comments        Pertinent Vitals/Pain Pain Assessment: Faces Faces Pain Scale: Hurts a little bit Pain Location: Lt hip Pain Descriptors / Indicators: Aching;Discomfort;Sore Pain Intervention(s): Limited activity within patient's tolerance;Monitored during session;Repositioned           PT Goals (current goals can now be found in the care plan section) Acute Rehab PT Goals Patient Stated Goal: get back to walking, cooking, and playing with grandkids PT Goal Formulation: With patient Time  For Goal Achievement: 03/11/20 Potential to Achieve Goals: Good Progress  towards PT goals: Progressing toward goals    Frequency    7X/week      PT Plan Current plan remains appropriate       AM-PAC PT "6 Clicks" Mobility   Outcome Measure  Help needed turning from your back to your side while in a flat bed without using bedrails?: None Help needed moving from lying on your back to sitting on the side of a flat bed without using bedrails?: None Help needed moving to and from a bed to a chair (including a wheelchair)?: A Little Help needed standing up from a chair using your arms (e.g., wheelchair or bedside chair)?: A Little Help needed to walk in hospital room?: A Little Help needed climbing 3-5 steps with a railing? : A Little 6 Click Score: 20    End of Session Equipment Utilized During Treatment: Gait belt Activity Tolerance: Patient tolerated treatment well;Patient limited by pain Patient left: in chair;with call bell/phone within reach;with chair alarm set;with family/visitor present Nurse Communication: Mobility status PT Visit Diagnosis: Muscle weakness (generalized) (M62.81);Difficulty in walking, not elsewhere classified (R26.2)     Time: 0158-6825 PT Time Calculation (min) (ACUTE ONLY): 17 min  Charges:  $Gait Training: 8-22 mins                      Verner Mould, DPT Acute Rehabilitation Services  Office [phone] Pager [phone]  03/05/2020 12:33 PM

## 2020-03-05 NOTE — Plan of Care (Signed)
?  Problem: Education: ?Goal: Knowledge of General Education information will improve ?Description: Including pain rating scale, medication(s)/side effects and non-pharmacologic comfort measures ?Outcome: Progressing ?  ?Problem: Health Behavior/Discharge Planning: ?Goal: Ability to manage health-related needs will improve ?Outcome: Progressing ?  ?Problem: Clinical Measurements: ?Goal: Ability to maintain clinical measurements within normal limits will improve ?Outcome: Progressing ?Goal: Will remain free from infection ?Outcome: Progressing ?Goal: Diagnostic test results will improve ?Outcome: Progressing ?Goal: Respiratory complications will improve ?Outcome: Progressing ?Goal: Cardiovascular complication will be avoided ?Outcome: Progressing ?  ?Problem: Activity: ?Goal: Risk for activity intolerance will decrease ?Outcome: Progressing ?  ?Problem: Nutrition: ?Goal: Adequate nutrition will be maintained ?Outcome: Progressing ?  ?Problem: Coping: ?Goal: Level of anxiety will decrease ?Outcome: Progressing ?  ?Problem: Elimination: ?Goal: Will not experience complications related to bowel motility ?Outcome: Progressing ?Goal: Will not experience complications related to urinary retention ?Outcome: Progressing ?  ?Problem: Pain Managment: ?Goal: General experience of comfort will improve ?Outcome: Progressing ?  ?Problem: Safety: ?Goal: Ability to remain free from injury will improve ?Outcome: Progressing ?  ?Problem: Skin Integrity: ?Goal: Risk for impaired skin integrity will decrease ?Outcome: Progressing ?  ?Problem: Activity: ?Goal: Ability to avoid complications of mobility impairment will improve ?Outcome: Progressing ?Goal: Ability to tolerate increased activity will improve ?Outcome: Progressing ?  ?Problem: Clinical Measurements: ?Goal: Postoperative complications will be avoided or minimized ?Outcome: Progressing ?  ?Problem: Pain Management: ?Goal: Pain level will decrease with appropriate  interventions ?Outcome: Progressing ?  ?Problem: Skin Integrity: ?Goal: Will show signs of wound healing ?Outcome: Progressing ?  ?

## 2020-03-05 NOTE — Care Plan (Signed)
Ortho Bundle Case Management Note  Patient Details  Name: REHAN HOLNESS MRN: [id number] Date of Birth: 1946-02-14  L THA on 03-04-20 DCP:  Home with wife.  1 story home with 2 ste. DME: RW and 3-in-1 ordered through [facility] PT:  HEP                   DME Arranged:  Walker rolling, 3-N-1 DME Agency:  [facility]  HH Arranged:  NA [place] Agency:  NA  Additional Comments: Please contact me with any questions of if this plan should need to change.  Marianne Sofia, RN,CCM [facility]  [phone] 03/05/2020, 12:29 PM

## 2020-03-05 NOTE — Progress Notes (Addendum)
   Subjective: 1 Day Post-Op Procedure(s) (LRB): TOTAL HIP ARTHROPLASTY ANTERIOR APPROACH (Left) Patient reports pain as mild.   Patient seen in rounds by Dr. Wynelle Link. Patient is well, and has had no acute complaints or problems other than discomfort in the left hip. Foley catheter removed, positive flatus. Denies CP, SHOB, N/V.  We will continue therapy today.   Objective: Vital signs in last 24 hours: Temp:  [97.5 F (36.4 C)-98.5 F (36.9 C)] 98.3 F (36.8 C) (07/22 0518) Pulse Rate:  [51-80] 69 (07/22 0518) Resp:  [12-20] 18 (07/22 0518) BP: (104-145)/(61-87) 125/71 (07/22 0518) SpO2:  [95 %-100 %] 97 % (07/22 0518)  Intake/Output from previous day:  Intake/Output Summary (Last 24 hours) at 03/05/2020 0802 Last data filed at 03/05/2020 0600 Gross per 24 hour  Intake 3774.54 ml  Output 2100 ml  Net 1674.54 ml     Intake/Output this shift: No intake/output data recorded.  Labs: Recent Labs    03/05/20 0325  HGB 13.4   Recent Labs    03/05/20 0325  WBC 18.5*  RBC 4.30  HCT 39.8  PLT 178   Recent Labs    03/05/20 0325  NA 138  K 3.7  CL 105  CO2 24  BUN 14  CREATININE 0.76  GLUCOSE 161*  CALCIUM 8.6*   No results for input(s): LABPT, INR in the last 72 hours.  Exam: General - Patient is Alert and Oriented Extremity - Neurologically intact Sensation intact distally Intact pulses distally Dorsiflexion/Plantar flexion intact Dressing - dressing C/D/I Motor Function - intact, moving foot and toes well on exam.   Past Medical History:  Diagnosis Date  . AAA (abdominal aortic aneurysm) ([place])   . Anemia    as infant history of  . CAD S/P PTCA & DES PCI - for Progressive Angina 02/26/2019   Cath-PCI 02/26/2019: EF 55-65%. mCx 65% (FFR 0.82 - Med Rx). D2 85% - [place] Scoring Balloon PTCA (2.0 mm). Ost rPDA - 90% -Resolute Onyx 2.5 x 15 (2.6 mm). --> 06/2019: staged DES PCI ost D1 (RESOLUTE ONYX 2.25 mm x 50 mm (2.6 mm) positioned to avoid the true  ostium)  . Elevated PSA   . GERD (gastroesophageal reflux disease)   . History of diabetes mellitus, type II    loss weight under control currently  . History of hiatal hernia    40 yrs ago  . History of pancreatitis    elevated lipase levels   . HYPERLIPIDEMIA 01/26/2009  . HYPERTENSION 01/26/2009  . 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 lower back pain, s/p cervical neck surgery"stiffness" remains  . Transfusion history    infant "anemia"    Assessment/Plan: 1 Day Post-Op Procedure(s) (LRB): TOTAL HIP ARTHROPLASTY ANTERIOR APPROACH (Left) Active Problems:   Primary osteoarthritis of left hip  Estimated body mass index is 25.86 kg/m as calculated from the following:   Height as of this encounter: 6' (1.829 m).   Weight as of this encounter: 86.5 kg. Advance diet Up with therapy D/C IV fluids  DVT Prophylaxis - Aspirin Weight bearing as tolerated.  Plan is to go Home after hospital stay. Plan for likely discharge home today as long as he is progressing with therapy and meets his goals. Follow up in the office in 2 weeks.   [place] Prescription Drug Monitoring Report reviewed prior to prescribing any controlled substances today.   Griffith Citron, PA-C Orthopedic Surgery [phone] 03/05/2020, 8:02 AM

## 2020-03-05 NOTE — TOC Transition Note (Signed)
Transition of Care [facility]) - CM/SW Discharge Note   Patient Details  Name: Luis Guzman MRN: [id number] Date of Birth: 12/12/1945  [facility] CM/SW Contact:  Lia Hopping, LCSW Phone Number: 03/05/2020, 10:12 AM   Clinical Narrative:    Therapy Plan: HEP RW and 3 in1 delivered to the patient bedside by [employer]   Final next level of care: Home/Self Care (HEP) Barriers to Discharge: No Barriers Identified   Patient Goals and CMS Choice     Choice offered to / list presented to : NA  Discharge Placement                       Discharge Plan and Services                DME Arranged: 3-N-1, Walker rolling DME Agency: [employer] Date DME Agency Contacted: 03/05/20 Time DME Agency Contacted: [phone] Representative spoke with at DME Agency: Ovid Curd HH Arranged: NA [place] Agency: NA        Social Determinants of Health ([place]) Interventions     Readmission Risk Interventions No flowsheet data found.

## 2020-03-06 ENCOUNTER — Encounter (HOSPITAL_COMMUNITY): Payer: Self-pay | Admitting: Orthopedic Surgery

## 2020-03-11 NOTE — Discharge Summary (Signed)
Physician Discharge Summary   Patient ID: Luis Guzman MRN: [id number] DOB/AGE: May 29, 1946 74 y.o.  Admit date: 03/04/2020 Discharge date: 03/05/2020  Primary Diagnosis: Osteoarthritis of the Left  hip.   Admission Diagnoses:  Past Medical History:  Diagnosis Date  . AAA (abdominal aortic aneurysm) ([place])   . Anemia    as infant history of  . CAD S/P PTCA & DES PCI - for Progressive Angina 02/26/2019   Cath-PCI 02/26/2019: EF 55-65%. mCx 65% (FFR 0.82 - Med Rx). D2 85% - Wolverine Scoring Balloon PTCA (2.0 mm). Ost rPDA - 90% -Resolute Onyx 2.5 x 15 (2.6 mm). --> 06/2019: staged DES PCI ost D1 (RESOLUTE ONYX 2.25 mm x 50 mm (2.6 mm) positioned to avoid the true ostium)  . Elevated PSA   . GERD (gastroesophageal reflux disease)   . History of diabetes mellitus, type II    loss weight under control currently  . History of hiatal hernia    40 yrs ago  . History of pancreatitis    elevated lipase levels   . HYPERLIPIDEMIA 01/26/2009  . HYPERTENSION 01/26/2009  . 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 lower back pain, s/p cervical neck surgery"stiffness" remains  . Transfusion history    infant "anemia"   Discharge Diagnoses:   Active Problems:   Primary osteoarthritis of left hip  Estimated body mass index is 25.86 kg/m as calculated from the following:   Height as of this encounter: 6' (1.829 m).   Weight as of this encounter: 86.5 kg.  Procedure:  Procedure(s) (LRB): TOTAL HIP ARTHROPLASTY ANTERIOR APPROACH (Left)   Consults: None  HPI: Luis Guzman is a 74 y.o. male who has advanced end-  stage arthritis of their Left  hip with progressively worsening pain and  dysfunction.The patient has failed nonoperative management and presents for  total hip arthroplasty.   Laboratory Data: Admission on 03/04/2020, Discharged on 03/05/2020  Component Date Value Ref Range Status  . Glucose-Capillary 03/04/2020 95  70 - 99 mg/dL Final   Glucose reference range  applies only to samples taken after fasting for at least 8 hours.  . WBC 03/05/2020 18.5* 4.0 - 10.5 K/uL Final  . RBC 03/05/2020 4.30  4.22 - 5.81 MIL/uL Final  . Hemoglobin 03/05/2020 13.4  13.0 - 17.0 g/dL Final  . HCT 03/05/2020 39.8  39 - 52 % Final  . MCV 03/05/2020 92.6  80.0 - 100.0 fL Final  . MCH 03/05/2020 31.2  26.0 - 34.0 pg Final  . MCHC 03/05/2020 33.7  30.0 - 36.0 g/dL Final  . RDW 03/05/2020 12.4  11.5 - 15.5 % Final  . Platelets 03/05/2020 178  150 - 400 K/uL Final  . nRBC 03/05/2020 0.0  0.0 - 0.2 % Final   Performed at [facility], [place] [address], [place], [place] [address]  . Sodium 03/05/2020 138  135 - 145 mmol/L Final  . Potassium 03/05/2020 3.7  3.5 - 5.1 mmol/L Final  . Chloride 03/05/2020 105  98 - 111 mmol/L Final  . CO2 03/05/2020 24  22 - 32 mmol/L Final  . Glucose, Bld 03/05/2020 161* 70 - 99 mg/dL Final   Glucose reference range applies only to samples taken after fasting for at least 8 hours.  . BUN 03/05/2020 14  8 - 23 mg/dL Final  . Creatinine, Ser 03/05/2020 0.76  0.61 - 1.24 mg/dL Final  . Calcium 03/05/2020 8.6* 8.9 - 10.3 mg/dL Final  . GFR calc non Af  Amer 03/05/2020 >60  >60 mL/min Final  . GFR calc Af Amer 03/05/2020 >60  >60 mL/min Final  . Anion gap 03/05/2020 9  5 - 15 Final   Performed at [facility], [place] [address], [place], [place] [address]  Hospital Outpatient Visit on 02/29/2020  Component Date Value Ref Range Status  . SARS Coronavirus 2 02/29/2020 NEGATIVE  NEGATIVE Final   Comment: (NOTE) SARS-CoV-2 target nucleic acids are NOT DETECTED.  The SARS-CoV-2 RNA is generally detectable in upper and lower respiratory specimens during the acute phase of infection. Negative results do not preclude SARS-CoV-2 infection, do not rule out co-infections with other pathogens, and should not be used as the sole basis for treatment or other patient management decisions. Negative results must be combined  with clinical observations, patient history, and epidemiological information. The expected result is Negative.  Fact Sheet for Patients: SugarRoll.be  Fact Sheet for Healthcare Providers: https://www.woods-mathews.com/  This test is not yet approved or cleared by the [place] FDA and  has been authorized for detection and/or diagnosis of SARS-CoV-2 by FDA under an Emergency Use Authorization (EUA). This EUA will remain  in effect (meaning this test can be used) for the duration of the COVID-19 declaration under Se                          ction 564(b)(1) of the Act, 21 U.S.C. section 360bbb-3(b)(1), unless the authorization is terminated or revoked sooner.  Performed at [facility], [place] [address] [place], [place] [address]   Hospital Outpatient Visit on 02/24/2020  Component Date Value Ref Range Status  . MRSA, PCR 02/24/2020 NEGATIVE  NEGATIVE Final  . Staphylococcus aureus 02/24/2020 NEGATIVE  NEGATIVE Final   Comment: (NOTE) The Xpert SA Assay (FDA approved for NASAL specimens in patients 30 years of age and older), is one component of a comprehensive surveillance program. It is not intended to diagnose infection nor to guide or monitor treatment. Performed at [facility], [place] [address] [place], [place] [address]   . aPTT 02/24/2020 31  24 - 36 seconds Final   Performed at [facility], [place] [address] [place], [place] [address]  . WBC 02/24/2020 8.5  4.0 - 10.5 K/uL Final  . RBC 02/24/2020 5.12  4.22 - 5.81 MIL/uL Final  . Hemoglobin 02/24/2020 15.9  13.0 - 17.0 g/dL Final  . HCT 02/24/2020 47.7  39 - 52 % Final  . MCV 02/24/2020 93.2  80.0 - 100.0 fL Final  . MCH 02/24/2020 31.1  26.0 - 34.0 pg Final  . MCHC 02/24/2020 33.3  30.0 - 36.0 g/dL Final  . RDW 02/24/2020 12.6  11.5 - 15.5 % Final  . Platelets 02/24/2020 237  150 - 400 K/uL Final  . nRBC 02/24/2020 0.0  0.0 - 0.2  % Final   Performed at [facility], [place] [address] [place],  [address]  . Sodium 02/24/2020 140  135 - 145 mmol/L Final  . Potassium 02/24/2020 3.8  3.5 - 5.1 mmol/L Final  . Chloride 02/24/2020 105  98 - 111 mmol/L Final  . CO2 02/24/2020 26  22 - 32 mmol/L Final  . Glucose, Bld 02/24/2020 107* 70 - 99 mg/dL Final   Glucose reference range applies only to samples taken after fasting for at least 8 hours.  . BUN 02/24/2020 14  8 - 23 mg/dL Final  . Creatinine, Ser 02/24/2020  0.86  0.61 - 1.24 mg/dL Final  . Calcium 02/24/2020 9.2  8.9 - 10.3 mg/dL Final  . Total Protein 02/24/2020 7.3  6.5 - 8.1 g/dL Final  . Albumin 02/24/2020 4.3  3.5 - 5.0 g/dL Final  . AST 02/24/2020 28  15 - 41 U/L Final  . ALT 02/24/2020 22  0 - 44 U/L Final  . Alkaline Phosphatase 02/24/2020 86  38 - 126 U/L Final  . Total Bilirubin 02/24/2020 0.7  0.3 - 1.2 mg/dL Final  . GFR calc non Af Amer 02/24/2020 >60  >60 mL/min Final  . GFR calc Af Amer 02/24/2020 >60  >60 mL/min Final  . Anion gap 02/24/2020 9  5 - 15 Final   Performed at [facility], [place] [address] [place], [place] [address]  . Prothrombin Time 02/24/2020 12.8  11.4 - 15.2 seconds Final  . INR 02/24/2020 1.0  0.8 - 1.2 Final   Comment: (NOTE) INR goal varies based on device and disease states. Performed at [facility], [place] [address] [place], [place] [address]   . ABO/RH(D) 02/24/2020 O POS   Final  . Antibody Screen 02/24/2020 NEG   Final  . Sample Expiration 02/24/2020 03/07/2020,2359   Final  . Extend sample reason 02/24/2020    Final                   Value:NO TRANSFUSIONS OR PREGNANCY IN THE PAST 3 MONTHS Performed at [facility], [place] [address] [place], [place] [address]   . Hgb A1c MFr Bld 02/24/2020 5.9* 4.8 - 5.6 % Final   Comment: (NOTE) Pre diabetes:          5.7%-6.4%  Diabetes:              >6.4%  Glycemic control for   <7.0% adults with  diabetes   . Mean Plasma Glucose 02/24/2020 122.63  mg/dL Final   Performed at [place] [address] [place], [place] [address]     X-Rays:DG Pelvis Portable  Result Date: 03/04/2020 CLINICAL DATA:  Total left hip arthroplasty. EXAM: PORTABLE PELVIS 1-2 VIEWS COMPARISON:  Intraoperative films, same date. FINDINGS: The femoral and acetabular components are well seated. No complicating features are identified. The pubic symphysis and SI joints are intact. No pelvic fractures. Stable moderate to advanced degenerative changes involving the right hip. IMPRESSION: Well seated components of a total left hip arthroplasty. Electronically Signed   By: Marijo Sanes M.D.   On: 03/04/2020 10:36   DG C-Arm 1-60 Min-No Report  Result Date: 03/04/2020 Fluoroscopy was utilized by the requesting physician.  No radiographic interpretation.   ECHOCARDIOGRAM COMPLETE  Result Date: 02/14/2020    ECHOCARDIOGRAM REPORT   Patient Name:   JARONE OSTERGAARD Date of Exam: 02/14/2020 Medical Rec #:  [id number]   Height:       72.0 in Accession #:    [id number]  Weight:       192.8 lb Date of Birth:  1946-02-22   BSA:          2.098 m Patient Age:    56 years    BP:           134/70 mmHg Patient Gender: M           HR:           61 bpm. Exam Location:  Outpatient Procedure: 2D Echo, Cardiac Doppler and Color Doppler Indications:    Pre-operative  cardiovascular examination V72.81 / Z01.810  History:        Patient has no prior history of Echocardiogram examinations.                 CAD, Signs/Symptoms:Chest Pain; Risk Factors:Hypertension,                 Dyslipidemia and Former Smoker. AAA. DOE.  Sonographer:    Vickie Epley RDCS Referring Phys: [place]  1. Left ventricular ejection fraction, by estimation, is 55 to 60%. The left ventricle has normal function. The left ventricle has no regional wall motion abnormalities. Left ventricular diastolic parameters are consistent with Grade I diastolic  dysfunction (impaired relaxation).  2. Right ventricular systolic function is normal. The right ventricular size is normal. There is normal pulmonary artery systolic pressure.  3. The mitral valve is normal in structure. Trivial mitral valve regurgitation. No evidence of mitral stenosis.  4. The aortic valve is tricuspid. Aortic valve regurgitation is mild. No aortic stenosis is present.  5. The inferior vena cava is normal in size with greater than 50% respiratory variability, suggesting right atrial pressure of 3 mmHg. FINDINGS  Left Ventricle: Left ventricular ejection fraction, by estimation, is 55 to 60%. The left ventricle has normal function. The left ventricle has no regional wall motion abnormalities. The left ventricular internal cavity size was normal in size. There is  no left ventricular hypertrophy. Left ventricular diastolic parameters are consistent with Grade I diastolic dysfunction (impaired relaxation). Normal left ventricular filling pressure. Right Ventricle: The right ventricular size is normal. No increase in right ventricular wall thickness. Right ventricular systolic function is normal. There is normal pulmonary artery systolic pressure. The tricuspid regurgitant velocity is 2.17 m/s, and  with an assumed right atrial pressure of 3 mmHg, the estimated right ventricular systolic pressure is 17.7 mmHg. Left Atrium: Left atrial size was normal in size. Right Atrium: Right atrial size was normal in size. Pericardium: There is no evidence of pericardial effusion. Mitral Valve: The mitral valve is normal in structure. Normal mobility of the mitral valve leaflets. Trivial mitral valve regurgitation. No evidence of mitral valve stenosis. Tricuspid Valve: The tricuspid valve is normal in structure. Tricuspid valve regurgitation is not demonstrated. No evidence of tricuspid stenosis. Aortic Valve: The aortic valve is tricuspid. Aortic valve regurgitation is mild. Aortic regurgitation PHT measures 707  msec. No aortic stenosis is present. Pulmonic Valve: The pulmonic valve was normal in structure. Pulmonic valve regurgitation is mild to moderate. No evidence of pulmonic stenosis. Aorta: The aortic root is normal in size and structure. Venous: The inferior vena cava is normal in size with greater than 50% respiratory variability, suggesting right atrial pressure of 3 mmHg. IAS/Shunts: No atrial level shunt detected by color flow Doppler.  LEFT VENTRICLE PLAX 2D LVIDd:         5.20 cm      Diastology LVIDs:         3.90 cm      LV e' lateral:   9.68 cm/s LV PW:         0.90 cm      LV E/e' lateral: 4.6 LV IVS:        0.90 cm      LV e' medial:    5.87 cm/s LVOT diam:     2.10 cm      LV E/e' medial:  7.6 LV SV:         71 LV SV Index:  34 LVOT Area:     3.46 cm  LV Volumes (MOD) LV vol d, MOD A2C: 96.5 ml LV vol d, MOD A4C: 132.0 ml LV vol s, MOD A2C: 43.7 ml LV vol s, MOD A4C: 57.0 ml LV SV MOD A2C:     52.8 ml LV SV MOD A4C:     132.0 ml LV SV MOD BP:      65.6 ml RIGHT VENTRICLE RV S prime:     12.00 cm/s TAPSE (M-mode): 2.6 cm LEFT ATRIUM             Index       RIGHT ATRIUM          Index LA diam:        4.20 cm 2.00 cm/m  RA Area:     9.84 cm LA Vol (A2C):   43.2 ml 20.59 ml/m RA Volume:   16.90 ml 8.06 ml/m LA Vol (A4C):   43.1 ml 20.55 ml/m LA Biplane Vol: 46.1 ml 21.98 ml/m  AORTIC VALVE LVOT Vmax:   90.60 cm/s LVOT Vmean:  63.800 cm/s LVOT VTI:    0.205 m AI PHT:      707 msec  AORTA Ao Root diam: 3.40 cm MITRAL VALVE               TRICUSPID VALVE MV Area (PHT): 3.60 cm    TR Peak grad:   18.8 mmHg MV Decel Time: 211 msec    TR Vmax:        217.00 cm/s MV E velocity: 44.70 cm/s MV A velocity: 60.30 cm/s  SHUNTS MV E/A ratio:  0.74        Systemic VTI:  0.20 m                            Systemic Diam: 2.10 cm Skeet Latch MD Electronically signed by Skeet Latch MD Signature Date/Time: 02/14/2020/11:23:23 AM    Final    DG HIP OPERATIVE UNILAT W OR W/O PELVIS LEFT  Result Date:  03/04/2020 CLINICAL DATA:  Post LEFT hip arthroplasty EXAM: OPERATIVE LEFT HIP (WITH PELVIS IF PERFORMED) 3 VIEWS TECHNIQUE: Fluoroscopic spot image(s) were submitted for interpretation post-operatively. COMPARISON:  09/18/2008 FLUOROSCOPY TIME:  0 minutes 6 seconds Images: 3 Dose: 1.6472 mGy FINDINGS: LEFT hip prosthesis identified in expected position. No fracture or dislocation seen. Degenerative changes of RIGHT hip joint also noted. Bones appear mildly demineralized. IMPRESSION: Post LEFT hip arthroplasty without acute abnormalities. Electronically Signed   By: Lavonia Dana M.D.   On: 03/04/2020 10:13    EKG: Orders placed or performed in visit on 02/13/20  . EKG 12-Lead     Hospital Course: FREMAN LAPAGE is a 74 y.o. who was admitted to [facility]. They were brought to the operating room on 03/04/2020 and underwent Procedure(s): [place].  Patient tolerated the procedure well and was later transferred to the recovery room and then to the orthopaedic floor for postoperative care. They were given PO and IV analgesics for pain control following their surgery. They were given 24 hours of postoperative antibiotics of  Anti-infectives (From admission, onward)   Start     Dose/Rate Route Frequency Ordered Stop   03/04/20 1430  ceFAZolin (ANCEF) IVPB 2g/100 mL premix        2 g 200 mL/hr over 30 Minutes Intravenous Every 6 hours 03/04/20 1137 03/05/20 0825   03/04/20 0630  ceFAZolin (  ANCEF) IVPB 2g/100 mL premix        2 g 200 mL/hr over 30 Minutes Intravenous On call to O.R. 03/04/20 5784 03/04/20 0903     and started on DVT prophylaxis in the form of Aspirin.   PT and OT were ordered for total joint protocol. Discharge planning consulted to help with postop disposition and equipment needs.  Patient had a good night on the evening of surgery. They started to get up OOB with therapy on POD #0. Pt was seen during rounds on POD #1 and was ready to go home pending  progress with therapy. He worked with therapy on POD #1 and was meeting his goals. Pt was discharged to home later that day in stable condition.  Diet: Regular diet Activity: WBAT Follow-up: in 2 weeks Disposition: Home Discharged Condition: good   Discharge Instructions    Call MD / Call 911   Complete by: As directed    If you experience chest pain or shortness of breath, CALL 911 and be transported to the hospital emergency room.  If you develope a fever above 101 F, pus (white drainage) or increased drainage or redness at the wound, or calf pain, call your surgeon's office.   Change dressing   Complete by: As directed    You have an adhesive waterproof bandage over the incision. Leave this in place until your first follow-up appointment. Once you remove this you will not need to place another bandage.   Constipation Prevention   Complete by: As directed    Drink plenty of fluids.  Prune juice may be helpful.  You may use a stool softener, such as Colace (over the counter) 100 mg twice a day.  Use MiraLax (over the counter) for constipation as needed.   Diet - low sodium heart healthy   Complete by: As directed    Do not sit on low chairs, stoools or toilet seats, as it may be difficult to get up from low surfaces   Complete by: As directed    Driving restrictions   Complete by: As directed    No driving for two weeks   TED hose   Complete by: As directed    Use stockings (TED hose) for three weeks on both leg(s).  You may remove them at night for sleeping.   Weight bearing as tolerated   Complete by: As directed      Allergies as of 03/05/2020      Reactions   Penicillins Itching   Has patient had a PCN reaction causing immediate rash, facial/tongue/throat swelling, SOB or lightheadedness with hypotension: no Has patient had a PCN reaction causing severe rash involving mucus membranes or skin necrosis: unkn Has patient had a PCN reaction that required hospitalization: no Has  patient had a PCN reaction occurring within the last 10 years: no If all of the above answers are "NO", then may proceed with Cephalosporin use. Tolerated Cephalosporin Date: 03/04/20.      Medication List    TAKE these medications   aspirin 325 MG EC tablet Take 1 tablet (325 mg total) by mouth daily for 20 days. Then resume home dose of aspirin 81 mg daily. What changed:   medication strength  how much to take  when to take this  additional instructions   baclofen 10 MG tablet Commonly known as: LIORESAL Take 10 mg by mouth daily as needed for muscle spasms.   bimatoprost 0.03 % ophthalmic solution Commonly known as: LUMIGAN Place  1 drop into both eyes at bedtime.   clopidogrel 75 MG tablet Commonly known as: PLAVIX TAKE 1 TABLET BY MOUTH  DAILY   hydrochlorothiazide 12.5 MG capsule Commonly known as: MICROZIDE Take 12.5 mg by mouth daily.   hydrochlorothiazide 25 MG tablet Commonly known as: HYDRODIURIL Take 1 tablet (25 mg total) by mouth daily. Starting in Aug 2021   HYDROcodone-acetaminophen 5-325 MG tablet Commonly known as: NORCO/VICODIN Take 1-2 tablets by mouth every 6 (six) hours as needed for moderate pain (pain score 4-6).   isosorbide mononitrate 30 MG 24 hr tablet Commonly known as: IMDUR Take 1 tablet (30 mg total) by mouth daily.   methocarbamol 500 MG tablet Commonly known as: ROBAXIN Take 1 tablet (500 mg total) by mouth every 6 (six) hours as needed for muscle spasms.   metoprolol succinate 25 MG 24 hr tablet Commonly known as: Toprol XL Take 0.5 tablets (12.5 mg total) by mouth daily.   nitroGLYCERIN 0.4 MG SL tablet Commonly known as: NITROSTAT Place 1 tablet (0.4 mg total) under the tongue every 5 (five) minutes as needed for chest pain.   Repatha SureClick 741 MG/ML Soaj Generic drug: Evolocumab Inject 140 mg into the skin every 14 (fourteen) days.   Simbrinza 1-0.2 % Susp Generic drug: Brinzolamide-Brimonidine Place 1 drop into  both eyes 2 (two) times daily.   valsartan 320 MG tablet Commonly known as: DIOVAN Take 160 mg by mouth daily.            Discharge Care Instructions  (From admission, onward)         Start     Ordered   03/05/20 0000  Weight bearing as tolerated        03/05/20 0815   03/05/20 0000  Change dressing       Comments: You have an adhesive waterproof bandage over the incision. Leave this in place until your first follow-up appointment. Once you remove this you will not need to place another bandage.   03/05/20 0815          Follow-up Information    Gaynelle Arabian, MD. Go on 03/17/2020.   Specialty: Orthopedic Surgery Why: You are scheduled for a post-operative appointment on 03-17-20 at 3:45 pm.  Contact information: [address] [place] [address] [phone]               Signed: Griffith Citron, PA-C Orthopedic Surgery 03/11/2020, 9:04 AM

## 2020-03-30 ENCOUNTER — Telehealth: Payer: Self-pay

## 2020-03-30 NOTE — Telephone Encounter (Signed)
   Primary Cardiologist: Glenetta Hew, MD  Chart reviewed as part of pre-operative protocol coverage. Given past medical history and time since last visit, based on ACC/AHA guidelines, AARON BOSTWICK would be at acceptable risk for the planned procedure without further cardiovascular testing.   His Plavix may be held for 5 days prior to his procedure.  Please resume 1 day postoperatively.  I will route this recommendation to the requesting party via Epic fax function and remove from pre-op pool.  Please call with questions.  Jossie Ng. Kaoru Benda NP-C    03/30/2020, 9:50 AM [place] [address] Office [phone] Fax [phone]

## 2020-03-30 NOTE — Telephone Encounter (Signed)
   [facility] Pre-operative Risk Assessment     Request for surgical clearance:  1. What type of surgery is being performed? PROSTATE BX   2. When is this surgery scheduled? TBD   3. What type of clearance is required (medical clearance vs. Pharmacy clearance to hold med vs. Both)? MEDICATION  4. Are there any medications that need to be held prior to surgery and how long?PLAVIX   5. Practice name and name of physician performing surgery? [facility]. DR Alinda Money ATTN:PATTI   6. What is the office phone number? [phone]   7.   What is the office fax number? [phone]  8.   Anesthesia type (None, local, MAC, general) ? NITROUS

## 2020-04-07 DIAGNOSIS — Z96642 Presence of left artificial hip joint: Secondary | ICD-10-CM | POA: Diagnosis not present

## 2020-04-07 DIAGNOSIS — Z471 Aftercare following joint replacement surgery: Secondary | ICD-10-CM | POA: Diagnosis not present

## 2020-04-14 ENCOUNTER — Telehealth: Payer: Self-pay | Admitting: Family Medicine

## 2020-04-14 NOTE — Progress Notes (Signed)
°  Chronic Care Management   Outreach Note  04/14/2020 Name: Luis Guzman MRN: [id number] DOB: 1946-08-09  Referred by: Eulas Post, MD Reason for referral : No chief complaint on file.   An unsuccessful telephone outreach was attempted today. The patient was referred to the pharmacist for assistance with care management and care coordination.   Follow Up Plan:   Carley Perdue [employer] Scheduler

## 2020-04-14 NOTE — Progress Notes (Signed)
  Chronic Care Management   Note  04/14/2020 Name: TRESEAN MATTIX MRN: [id number] DOB: 1946-02-08  ATUL DELUCIA is a 74 y.o. year old male who is a primary care patient of Burchette, Alinda Sierras, MD. I reached out to [name] by phone today in response to a referral sent by Mr. Hyrum Shaneyfelt Racanelli's PCP, Eulas Post, MD.   Mr. Blessinger was given information about Chronic Care Management services today including:  1. CCM service includes personalized support from designated clinical staff supervised by his physician, including individualized plan of care and coordination with other care providers 2. 24/7 contact phone numbers for assistance for urgent and routine care needs. 3. Service will only be billed when office clinical staff spend 20 minutes or more in a month to coordinate care. 4. Only one practitioner may furnish and bill the service in a calendar month. 5. The patient may stop CCM services at any time (effective at the end of the month) by phone call to the office staff.   Patient agreed to services and verbal consent obtained.   Follow up plan:   Carley Perdue [employer] Scheduler

## 2020-04-15 ENCOUNTER — Other Ambulatory Visit: Payer: Self-pay | Admitting: Physician Assistant

## 2020-04-15 NOTE — Telephone Encounter (Signed)
This is Dr. Harding's pt. °

## 2020-05-06 ENCOUNTER — Other Ambulatory Visit: Payer: Self-pay

## 2020-05-06 ENCOUNTER — Encounter: Payer: Self-pay | Admitting: Family Medicine

## 2020-05-06 ENCOUNTER — Ambulatory Visit (INDEPENDENT_AMBULATORY_CARE_PROVIDER_SITE_OTHER): Payer: Medicare Other | Admitting: Family Medicine

## 2020-05-06 VITALS — BP 100/64 | HR 71 | Temp 98.1°F | Ht 73.0 in | Wt 189.2 lb

## 2020-05-06 DIAGNOSIS — E785 Hyperlipidemia, unspecified: Secondary | ICD-10-CM

## 2020-05-06 DIAGNOSIS — Z9861 Coronary angioplasty status: Secondary | ICD-10-CM | POA: Diagnosis not present

## 2020-05-06 DIAGNOSIS — Z23 Encounter for immunization: Secondary | ICD-10-CM | POA: Diagnosis not present

## 2020-05-06 DIAGNOSIS — I2510 Atherosclerotic heart disease of native coronary artery without angina pectoris: Secondary | ICD-10-CM | POA: Diagnosis not present

## 2020-05-06 DIAGNOSIS — I10 Essential (primary) hypertension: Secondary | ICD-10-CM | POA: Diagnosis not present

## 2020-05-06 DIAGNOSIS — Z0000 Encounter for general adult medical examination without abnormal findings: Secondary | ICD-10-CM

## 2020-05-06 DIAGNOSIS — F5101 Primary insomnia: Secondary | ICD-10-CM

## 2020-05-06 DIAGNOSIS — D72829 Elevated white blood cell count, unspecified: Secondary | ICD-10-CM | POA: Diagnosis not present

## 2020-05-06 DIAGNOSIS — R739 Hyperglycemia, unspecified: Secondary | ICD-10-CM | POA: Diagnosis not present

## 2020-05-06 MED ORDER — TRAZODONE HCL 50 MG PO TABS: 25.0000 mg | ORAL_TABLET | Freq: Every evening | ORAL | 3 refills | Status: DC | PRN

## 2020-05-06 NOTE — Progress Notes (Signed)
Established Patient Office Visit  Subjective:  Patient ID: Luis Guzman, male    DOB: [birth year]  Age: 74 y.o. MRN: [id number]  CC:  Chief Complaint  Patient presents with  . Annual Exam    HPI Luis Guzman presents for Medicare wellness visit and medical follow-up.  He had recent left total hip replacement and is recovering from that.  History of abdominal aortic aneurysm which is followed by cardiology.  History of CAD.  Osteoarthritis involving multiple joints.  History of dyslipidemia.  Intolerant of statins.  He is on Repatha injections per cardiology.  He had leukocytosis following his hip surgery and suspect this was secondary to demargination.  He has not had any recent fever.  He does have elevated PSA and has scheduled prostate biopsy with urology next week.  He had MRI which was unremarkable of the prostate.  He has hyperglycemia history.  Luis Guzman 5.9%.  No polyuria or polydipsia.  He needs follow-up lipids and other labs at this time.  His other issue is insomnia.  He has difficulty falling asleep and staying asleep.  No alcohol use.  No late the use of caffeine.  No daytime napping.  He tried melatonin up to 10 mg with minimal success.  Frequently falls asleep for about 1 hour then wakes up and is up much of the night.  1.  Risk factors based on Past Medical , Social, and Family history reviewed and as indicated above with no changes  2.  Limitations in physical activities None.  No recent falls.  Hip replacement surgery as above.  Gait stable.  Good balance.  3.  Depression/mood No active depression or anxiety issues HQ 2 equals 0  4.  Hearing No deficits  5.  ADLs independent in all.  6.  Cognitive function (orientation to time and place, language, writing, speech,memory) no short or long term memory issues.  Language and judgement intact.  7.  Home Safety no issues  8.  Height, weight, and visual acuity.all stable. He gets regular eye exams from the [place] and  has scheduled appointment in 2 weeks.  He does have history of glaucoma and cataracts which they are following closely  Wt Readings from Last 3 Encounters:  05/06/20 189 lb 3.2 oz (85.8 kg)  03/04/20 190 lb 11.2 oz (86.5 kg)  02/24/20 190 lb 9.6 oz (86.5 kg)    9.  Counseling discussed Counseled regarding age and gender appropriate preventative screenings and immunizations.  10. Recommendation of preventive services.  Needs flu vaccine.  Covid vaccine already given.  He has had both shingles vaccines.  11. Labs based on risk factors-lipid, hepatic, basic metabolic panel, W4X, CBC  12. Care Plan-as below  13. Other Providers PCPs Type     Maricsa Sammons, Alinda Sierras, MD General  Leonie Man, MD Cardiology   Other Patient Care Team Members Specialty     Kristeen Miss, MD Neurosurgery  Garald Balding, MD Orthopedic Surgery  Viona Gilmore, [facility] Pharmacist    14. Written schedule of screening/prevention services given to patient. Health Maintenance  Topic Date Due  . OPHTHALMOLOGY EXAM  03/30/2019  . FOOT EXAM  05/03/2019  . INFLUENZA VACCINE  03/15/2020  . HEMOGLOBIN Guzman  08/26/2020  . TETANUS/TDAP  05/05/2024  . COLONOSCOPY  02/14/2026  . COVID-19 Vaccine  Completed  . Hepatitis C Screening  Completed  . PNA vac Low Risk Adult  Completed      Past Medical History:  Diagnosis Date  . AAA (abdominal aortic aneurysm) ([place])   . Anemia    as infant history of  . CAD S/P PTCA & DES PCI - for Progressive Angina 02/26/2019   Cath-PCI 02/26/2019: EF 55-65%. mCx 65% (FFR 0.82 - Med Rx). D2 85% - Wolverine Scoring Balloon PTCA (2.0 mm). Ost rPDA - 90% -Resolute Onyx 2.5 x 15 (2.6 mm). --> 06/2019: staged DES PCI ost D1 (RESOLUTE ONYX 2.25 mm x 50 mm (2.6 mm) positioned to avoid the true ostium)  . Elevated PSA   . GERD (gastroesophageal reflux disease)   . History of diabetes mellitus, type II    loss weight under control currently  . History of hiatal hernia    40 yrs  ago  . History of pancreatitis    elevated lipase levels   . HYPERLIPIDEMIA 01/26/2009  . HYPERTENSION 01/26/2009  . 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 lower back pain, s/p cervical neck surgery"stiffness" remains  . Transfusion history    infant "anemia"    Past Surgical History:  Procedure Laterality Date  . APPENDECTOMY  ~ 1959  . BACK SURGERY    . CERVICAL DISC ARTHROPLASTY N/A 07/30/2018   Procedure: Cervical five-six Cervical six-seven Artificial disc replacement;  Surgeon: Kristeen Miss, MD;  Location: [place];  Service: Neurosurgery;  Laterality: N/A;  . CERVICAL LAMINECTOMY  1997   C 4 and C 5  . CHOLECYSTECTOMY N/A 04/15/2014   Procedure: LAPAROSCOPIC CHOLECYSTECTOMY WITH INTRAOPERATIVE CHOLANGIOGRAM;  Surgeon: Armandina Gemma, MD;  Location: [facility];  Service: General;  Laterality: N/A;  . COLONOSCOPY    . CORONARY BALLOON ANGIOPLASTY N/A 02/26/2019   Procedure: CORONARY BALLOON ANGIOPLASTY;  Surgeon: Leonie Man, MD;  Location: [place] [facility];  Service: Cardiovascular -> scoring balloon PTCA (Wolverine 2.0 mm) of ostial D2 85% reducing to 20-30%.  . CORONARY STENT INTERVENTION N/A 02/26/2019   Procedure: CORONARY STENT INTERVENTION;  Surgeon: Leonie Man, MD;  Location: [place] [facility];; PCI ostial RPDA (initial attempt of PTCA only led to small local tear/dissection covered with stent) Resolute Onyx 2.5 mm x 15 mm (2.6 mm  . CORONARY STENT INTERVENTION N/A 07/05/2019   Procedure: CORONARY STENT INTERVENTION;  Surgeon: Leonie Man, MD;  Location: [place] [facility];  Service: Cardiovascular;; ostial D1 95% (restenosis of PTCA site) -> DES PCI RESOLUTE ONYX 2.25 mm x 50 mm (2.6 mm) positioned to avoid the true ostium.   . ESOPHAGOGASTRODUODENOSCOPY N/A 03/13/2014   Procedure: ESOPHAGOGASTRODUODENOSCOPY (EGD);  Surgeon: Wonda Horner, MD;  Location[facility];  Service: Endoscopy;  Laterality: N/A;  . INTRAVASCULAR PRESSURE WIRE/FFR STUDY  N/A 02/26/2019   Procedure: INTRAVASCULAR PRESSURE WIRE/FFR STUDY;  Surgeon: Leonie Man, MD;  Location: [place] [facility];  Service: Cardiovascular;; mCx ~65% - DFR 0.92, FR 0.82 - BORDERLINE--> MED Rx  . LEFT HEART CATH AND CORONARY ANGIOGRAPHY N/A 02/26/2019   Procedure: LEFT HEART CATH AND CORONARY ANGIOGRAPHY;  Surgeon: Leonie Man, MD;  Location: [place] [facility];  EF 55-65%. mCx 65% (FFR 0.82 - Med Rx). D2 85% - Wolverine Scoring Balloon PTCA (2.0 mm). Ost rPDA - 90% -Resolute Onyx 2.5 x 15 (2.6 mm).   . LEFT HEART CATH AND CORONARY ANGIOGRAPHY N/A 07/05/2019   Procedure: LEFT HEART CATH AND CORONARY ANGIOGRAPHY;  Surgeon: Leonie Man, MD;  [facility];; ostial D1 95% (restenosis of PTCA site) -> DES PCI.  PDA stent widely patent.  Mid  CX stable 65% lesion.  Proximal RCA 40%.  Mid RCA 45%.  Normal EDP.  [name] 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  . SHOULDER ARTHROSCOPY Bilateral   . SHOULDER ARTHROSCOPY W/ ROTATOR CUFF REPAIR Bilateral 2004-2009   left 2004, right 2009  . TOTAL HIP ARTHROPLASTY Left 03/04/2020   Procedure: TOTAL HIP ARTHROPLASTY ANTERIOR APPROACH;  Surgeon: Gaynelle Arabian, MD;  Location: [facility];  Service: Orthopedics;  Laterality: Left;  157min  . VASECTOMY    . WISDOM TOOTH EXTRACTION      Family History  Problem Relation Age of Onset  . Hypertension Mother   . Hypertension Father   . Arthritis Sister        rheumatoid  . Stroke Sister 14    Social History   Socioeconomic History  . Marital status: Married    Spouse name: Not on file  . Number of children: Not on file  . Years of education: Not on file  . Highest education level: Not on file  Occupational History  . Not on file  Tobacco Use  . Smoking status: Former Smoker    Packs/day: 1.00    Years: 5.00    Pack years: 5.00    Types: Cigarettes    Quit date: 10/17/1968    Years since quitting: 51.5  . Smokeless tobacco: Never Used  Vaping Use  .  Vaping Use: Never used  Substance and Sexual Activity  . Alcohol use: Not Currently    Comment: "quit drinking ~ 10/1968  . Drug use: No  . Sexual activity: Not on file  Other Topics Concern  . Not on file  Social History Narrative  . Not on file   Social Determinants of Health   Financial Resource Strain:   . Difficulty of Paying Living Expenses: Not on file  Food Insecurity:   . Worried About Charity fundraiser in the Last Year: Not on file  . Ran Out of Food in the Last Year: Not on file  Transportation Needs:   . Lack of Transportation (Medical): Not on file  . Lack of Transportation (Non-Medical): Not on file  Physical Activity:   . Days of Exercise per Week: Not on file  . Minutes of Exercise per Session: Not on file  Stress:   . Feeling of Stress : Not on file  Social Connections:   . Frequency of Communication with Friends and Family: Not on file  . Frequency of Social Gatherings with Friends and Family: Not on file  . Attends Religious Services: Not on file  . Active Member of Clubs or Organizations: Not on file  . Attends Archivist Meetings: Not on file  . Marital Status: Not on file  Intimate Partner Violence:   . Fear of Current or Ex-Partner: Not on file  . Emotionally Abused: Not on file  . Physically Abused: Not on file  . Sexually Abused: Not on file    Outpatient Medications Prior to Visit  Medication Sig Dispense Refill  . bimatoprost (LUMIGAN) 0.03 % ophthalmic solution Place 1 drop into both eyes at bedtime.    . Brinzolamide-Brimonidine (SIMBRINZA) 1-0.2 % SUSP Place 1 drop into both eyes 2 (two) times daily.     . clopidogrel (PLAVIX) 75 MG tablet TAKE 1 TABLET BY MOUTH  DAILY 90 tablet 3  . Evolocumab (REPATHA SURECLICK) 622 MG/ML SOAJ Inject 140 mg into the skin every 14 (fourteen) days. 6 pen 3  . hydrochlorothiazide (HYDRODIURIL) 25 MG  tablet Take 1 tablet (25 mg total) by mouth daily. Starting in Aug 2021 90 tablet 3  . isosorbide  mononitrate (IMDUR) 30 MG 24 hr tablet TAKE 1 TABLET BY MOUTH IN  THE EVENING 90 tablet 3  . nitroGLYCERIN (NITROSTAT) 0.4 MG SL tablet Place 1 tablet (0.4 mg total) under the tongue every 5 (five) minutes as needed for chest pain. 25 tablet 3  . valsartan (DIOVAN) 320 MG tablet Take 160 mg by mouth daily.    . baclofen (LIORESAL) 10 MG tablet Take 10 mg by mouth daily as needed for muscle spasms.    . methocarbamol (ROBAXIN) 500 MG tablet Take 1 tablet (500 mg total) by mouth every 6 (six) hours as needed for muscle spasms. 40 tablet 0  . hydrochlorothiazide (MICROZIDE) 12.5 MG capsule Take 12.5 mg by mouth daily.    [name] HYDROcodone-acetaminophen (NORCO/VICODIN) 5-325 MG tablet Take 1-2 tablets by mouth every 6 (six) hours as needed for moderate pain (pain score 4-6). 56 tablet 0  . metoprolol succinate (TOPROL XL) 25 MG 24 hr tablet Take 0.5 tablets (12.5 mg total) by mouth daily. 45 tablet 3   No facility-administered medications prior to visit.    Allergies  Allergen Reactions  . Penicillins Itching    Has patient had a PCN reaction causing immediate rash, facial/tongue/throat swelling, SOB or lightheadedness with hypotension: no Has patient had a PCN reaction causing severe rash involving mucus membranes or skin necrosis: unkn Has patient had a PCN reaction that required hospitalization: no Has patient had a PCN reaction occurring within the last 10 years: no If all of the above answers are "NO", then may proceed with Cephalosporin use.  Tolerated Cephalosporin Date: 03/04/20.       ROS Review of Systems  Constitutional: Negative for fatigue.  Eyes: Negative for visual disturbance.  Respiratory: Negative for cough, chest tightness and shortness of breath.   Cardiovascular: Negative for chest pain, palpitations and leg swelling.  Endocrine: Negative for polydipsia and polyuria.  Neurological: Negative for dizziness, syncope, weakness, light-headedness and headaches.        Objective:    Physical Exam Constitutional:      General: He is not in acute distress.    Appearance: He is well-developed.  HENT:     Head: Normocephalic and atraumatic.     Right Ear: External ear normal.     Left Ear: External ear normal.  Eyes:     Conjunctiva/sclera: Conjunctivae normal.     Pupils: Pupils are equal, round, and reactive to light.  Neck:     Thyroid: No thyromegaly.  Cardiovascular:     Rate and Rhythm: Normal rate and regular rhythm.     Heart sounds: Normal heart sounds. No murmur heard.   Pulmonary:     Effort: No respiratory distress.     Breath sounds: No wheezing or rales.  Abdominal:     General: Bowel sounds are normal. There is no distension.     Palpations: Abdomen is soft. There is no mass.     Tenderness: There is no abdominal tenderness. There is no guarding or rebound.  Musculoskeletal:     Cervical back: Normal range of motion and neck supple.  Lymphadenopathy:     Cervical: No cervical adenopathy.  Skin:    Findings: No rash.  Neurological:     Mental Status: He is alert and oriented to person, place, and time.     Cranial Nerves: No cranial nerve deficit.  Deep Tendon Reflexes: Reflexes normal.     BP 100/64 (BP Location: Left Arm, Patient Position: Sitting, Cuff Size: Large)   Pulse 71   Temp 98.1 F (36.7 C) (Oral)   Ht 6\' 1"  (1.854 m)   Wt 189 lb 3.2 oz (85.8 kg)   SpO2 97%   BMI 24.96 kg/m  Wt Readings from Last 3 Encounters:  05/06/20 189 lb 3.2 oz (85.8 kg)  03/04/20 190 lb 11.2 oz (86.5 kg)  02/24/20 190 lb 9.6 oz (86.5 kg)     Health Maintenance Due  Topic Date Due  . OPHTHALMOLOGY EXAM  03/30/2019  . FOOT EXAM  05/03/2019  . INFLUENZA VACCINE  03/15/2020    There are no preventive care reminders to display for this patient.  Lab Results  Component Value Date   TSH 0.61 05/06/2019   Lab Results  Component Value Date   WBC 18.5 (H) 03/05/2020   HGB 13.4 03/05/2020   HCT 39.8 03/05/2020   MCV  92.6 03/05/2020   PLT 178 03/05/2020   Lab Results  Component Value Date   NA 138 03/05/2020   K 3.7 03/05/2020   CO2 24 03/05/2020   GLUCOSE 161 (H) 03/05/2020   BUN 14 03/05/2020   CREATININE 0.76 03/05/2020   BILITOT 0.7 02/24/2020   ALKPHOS 86 02/24/2020   AST 28 02/24/2020   ALT 22 02/24/2020   PROT 7.3 02/24/2020   ALBUMIN 4.3 02/24/2020   CALCIUM 8.6 (L) 03/05/2020   ANIONGAP 9 03/05/2020   GFR 88.33 05/06/2019   Lab Results  Component Value Date   CHOL 110 05/06/2019   Lab Results  Component Value Date   HDL 37.40 (L) 05/06/2019   Lab Results  Component Value Date   LDLCALC 53 05/06/2019   Lab Results  Component Value Date   TRIG 101.0 05/06/2019   Lab Results  Component Value Date   CHOLHDL 3 05/06/2019   Lab Results  Component Value Date   HGBA1C 5.9 (H) 02/24/2020      Assessment & Plan:   #1 Medicare subsequent annual wellness visit  -Vaccines up-to-date with exception of flu vaccine.  He plans to get Covid booster in October  #2 history of CAD.  Goal LDL less than 70.  Patient on Repatha -Recheck lipid and hepatic panel  #3 history of leukocytosis following surgery -Recheck CBC  #4 history of hyperglycemia -Recheck Guzman  #5 insomnia.  Minimal relief with melatonin -Reviewed sleep hygiene. -Consider trial of trazodone 50 mg nightly as needed  #6 history of elevated PSA with pending prostate biopsy per urology in 1 week  Meds ordered this encounter  Medications  . traZODone (DESYREL) 50 MG tablet    Sig: Take 0.5-1 tablets (25-50 mg total) by mouth at bedtime as needed for sleep.    Dispense:  30 tablet    Refill:  3    Follow-up: No follow-ups on file.    Carolann Littler, MD

## 2020-05-06 NOTE — Patient Instructions (Signed)

## 2020-05-06 NOTE — Addendum Note (Signed)
Addended by: Westley Hummer B on: 05/06/2020 05:02 PM   Modules accepted: Orders

## 2020-05-07 LAB — HEMOGLOBIN A1C
Hgb A1c MFr Bld: 5.8 % of total Hgb — ABNORMAL HIGH (ref <5.7)
Mean Plasma Glucose: 120 (calc)
eAG (mmol/L): 6.6 (calc)

## 2020-05-07 LAB — CBC WITH DIFFERENTIAL/PLATELET
Absolute Monocytes: 853 cells/uL (ref 200–950)
Basophils Absolute: 127 cells/uL (ref 0–200)
Basophils Relative: 1.3 %
Eosinophils Absolute: 118 cells/uL (ref 15–500)
Eosinophils Relative: 1.2 %
HCT: 48.4 % (ref 38.5–50.0)
Hemoglobin: 15.9 g/dL (ref 13.2–17.1)
Lymphs Abs: 2577 cells/uL (ref 850–3900)
MCH: 30.3 pg (ref 27.0–33.0)
MCHC: 32.9 g/dL (ref 32.0–36.0)
MCV: 92.2 fL (ref 80.0–100.0)
MPV: 10.6 fL (ref 7.5–12.5)
Monocytes Relative: 8.7 %
Neutro Abs: 6125 cells/uL (ref 1500–7800)
Neutrophils Relative %: 62.5 %
Platelets: 278 10*3/uL (ref 140–400)
RBC: 5.25 10*6/uL (ref 4.20–5.80)
RDW: 12.7 % (ref 11.0–15.0)
Total Lymphocyte: 26.3 %
WBC: 9.8 10*3/uL (ref 3.8–10.8)

## 2020-05-07 LAB — HEPATIC FUNCTION PANEL
AG Ratio: 1.9 (calc) (ref 1.0–2.5)
ALT: 17 U/L (ref 9–46)
AST: 21 U/L (ref 10–35)
Albumin: 4.8 g/dL (ref 3.6–5.1)
Alkaline phosphatase (APISO): 105 U/L (ref 35–144)
Bilirubin, Direct: 0.1 mg/dL (ref 0.0–0.2)
Globulin: 2.5 g/dL (calc) (ref 1.9–3.7)
Indirect Bilirubin: 0.4 mg/dL (calc) (ref 0.2–1.2)
Total Bilirubin: 0.5 mg/dL (ref 0.2–1.2)
Total Protein: 7.3 g/dL (ref 6.1–8.1)

## 2020-05-07 LAB — LIPID PANEL
Cholesterol: 176 mg/dL (ref <200)
HDL: 51 mg/dL (ref >=40)
LDL Cholesterol (Calc): 98 mg/dL (calc)
Non-HDL Cholesterol (Calc): 125 mg/dL (calc) (ref <130)
Total CHOL/HDL Ratio: 3.5 (calc) (ref <5.0)
Triglycerides: 178 mg/dL — ABNORMAL HIGH (ref <150)

## 2020-05-07 LAB — BASIC METABOLIC PANEL
BUN: 15 mg/dL (ref 7–25)
CO2: 27 mmol/L (ref 20–32)
Calcium: 9.7 mg/dL (ref 8.6–10.3)
Chloride: 104 mmol/L (ref 98–110)
Creat: 0.94 mg/dL (ref 0.70–1.18)
Glucose, Bld: 111 mg/dL — ABNORMAL HIGH (ref 65–99)
Potassium: 3.5 mmol/L (ref 3.5–5.3)
Sodium: 141 mmol/L (ref 135–146)

## 2020-05-13 ENCOUNTER — Ambulatory Visit: Payer: Medicare Other

## 2020-05-13 DIAGNOSIS — R972 Elevated prostate specific antigen [PSA]: Secondary | ICD-10-CM | POA: Diagnosis not present

## 2020-05-15 ENCOUNTER — Other Ambulatory Visit: Payer: Self-pay

## 2020-05-15 ENCOUNTER — Ambulatory Visit: Payer: Medicare Other

## 2020-05-15 DIAGNOSIS — Z0000 Encounter for general adult medical examination without abnormal findings: Secondary | ICD-10-CM

## 2020-05-15 NOTE — Progress Notes (Signed)
Subjective:   Luis Guzman is a 74 y.o. male who presents for Medicare Annual/Subsequent preventive examination.  I connected with Luis Guzman  today by telephone and verified that I am speaking with the correct person using two identifiers. Location patient: home Location provider: work Persons participating in the virtual visit: patient, provider.   I discussed the limitations, risks, security and privacy concerns of performing an evaluation and management service by telephone and the availability of in person appointments. I also discussed with the patient that there may be a patient responsible charge related to this service. The patient expressed understanding and verbally consented to this telephonic visit.    Interactive audio and video telecommunications were attempted between this provider and patient, however failed, due to patient having technical difficulties OR patient did not have access to video capability.  We continued and completed visit with audio only.     Review of Systems    N/A Cardiac Risk Factors include: advanced age (>76men, >26 women);male gender;dyslipidemia;hypertension     Objective:    Today's Vitals   There is no height or weight on file to calculate BMI.  Advanced Directives 05/15/2020 03/04/2020 03/04/2020 02/24/2020 07/05/2019 02/26/2019 02/26/2019  Does Patient Have a Medical Advance Directive? Yes Yes Yes Yes Yes Yes Yes  Type of Paramedic of [place];Living will [place];Living will [place];Living will [place];Living will [place];Living will [place];Living will [place];Living will  Does patient want to make changes to medical advance directive? No - Patient declined No - Patient declined No - Patient declined No - Patient declined - No - Patient declined -  Copy of [place] in Chart? No  - copy requested No - copy requested No - copy requested No - copy requested - No - copy requested -  Pre-existing out of facility DNR order (yellow form or pink MOST form) - - - - - - -    Current Medications (verified) Outpatient Encounter Medications as of 05/15/2020  Medication Sig  . bimatoprost (LUMIGAN) 0.03 % ophthalmic solution Place 1 drop into both eyes at bedtime.  . Brinzolamide-Brimonidine (SIMBRINZA) 1-0.2 % SUSP Place 1 drop into both eyes 2 (two) times daily.   . clopidogrel (PLAVIX) 75 MG tablet TAKE 1 TABLET BY MOUTH  DAILY  . Evolocumab (REPATHA SURECLICK) 144 MG/ML SOAJ Inject 140 mg into the skin every 14 (fourteen) days.  . isosorbide mononitrate (IMDUR) 30 MG 24 hr tablet TAKE 1 TABLET BY MOUTH IN  THE EVENING  . traZODone (DESYREL) 50 MG tablet Take 0.5-1 tablets (25-50 mg total) by mouth at bedtime as needed for sleep.  . valsartan (DIOVAN) 320 MG tablet Take 160 mg by mouth daily.  . hydrochlorothiazide (HYDRODIURIL) 25 MG tablet Take 1 tablet (25 mg total) by mouth daily. Starting in Aug 2021  . nitroGLYCERIN (NITROSTAT) 0.4 MG SL tablet Place 1 tablet (0.4 mg total) under the tongue every 5 (five) minutes as needed for chest pain. (Patient not taking: Reported on 05/15/2020)   No facility-administered encounter medications on file as of 05/15/2020.    Allergies (verified) Penicillins   History: Past Medical History:  Diagnosis Date  . AAA (abdominal aortic aneurysm) ([place])   . Anemia    as infant history of  . CAD S/P PTCA & DES PCI - for Progressive Angina 02/26/2019   Cath-PCI 02/26/2019: EF 55-65%. mCx 65% (FFR 0.82 -  Med Rx). D2 85% - Wolverine Scoring Balloon PTCA (2.0 mm). Ost rPDA - 90% -Resolute Onyx 2.5 x 15 (2.6 mm). --> 06/2019: staged DES PCI ost D1 (RESOLUTE ONYX 2.25 mm x 50 mm (2.6 mm) positioned to avoid the true ostium)  . Elevated PSA   . GERD (gastroesophageal reflux disease)   . History of diabetes mellitus, type II    loss weight under  control currently  . History of hiatal hernia    40 yrs ago  . History of pancreatitis    elevated lipase levels   . HYPERLIPIDEMIA 01/26/2009  . HYPERTENSION 01/26/2009  . 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 lower back pain, s/p cervical neck surgery"stiffness" remains  . Transfusion history    infant "anemia"   Past Surgical History:  Procedure Laterality Date  . APPENDECTOMY  ~ 1959  . BACK SURGERY    . CERVICAL DISC ARTHROPLASTY N/A 07/30/2018   Procedure: Cervical five-six Cervical six-seven Artificial disc replacement;  Surgeon: Kristeen Miss, MD;  Location: [facility];  Service: Neurosurgery;  Laterality: N/A;  . CERVICAL LAMINECTOMY  1997   C 4 and C 5  . CHOLECYSTECTOMY N/A 04/15/2014   Procedure: LAPAROSCOPIC CHOLECYSTECTOMY WITH INTRAOPERATIVE CHOLANGIOGRAM;  Surgeon: Armandina Gemma, MD;  Location: [facility];  Service: General;  Laterality: N/A;  . COLONOSCOPY    . CORONARY BALLOON ANGIOPLASTY N/A 02/26/2019   Procedure: CORONARY BALLOON ANGIOPLASTY;  Surgeon: Leonie Man, MD;  Location: [facility];  Service: Cardiovascular -> scoring balloon PTCA (Wolverine 2.0 mm) of ostial D2 85% reducing to 20-30%.  . CORONARY STENT INTERVENTION N/A 02/26/2019   Procedure: CORONARY STENT INTERVENTION;  Surgeon: Leonie Man, MD;  Location: [facility];; PCI ostial RPDA (initial attempt of PTCA only led to small local tear/dissection covered with stent) Resolute Onyx 2.5 mm x 15 mm (2.6 mm  . CORONARY STENT INTERVENTION N/A 07/05/2019   Procedure: CORONARY STENT INTERVENTION;  Surgeon: Leonie Man, MD;  Location: [facility];  Service: Cardiovascular;; ostial D1 95% (restenosis of PTCA site) -> DES PCI RESOLUTE ONYX 2.25 mm x 50 mm (2.6 mm) positioned to avoid the true ostium.   . ESOPHAGOGASTRODUODENOSCOPY N/A 03/13/2014   Procedure: ESOPHAGOGASTRODUODENOSCOPY (EGD);  Surgeon: Wonda Horner, MD;  Location[facility];  Service: Endoscopy;   Laterality: N/A;  . INTRAVASCULAR PRESSURE WIRE/FFR STUDY N/A 02/26/2019   Procedure: INTRAVASCULAR PRESSURE WIRE/FFR STUDY;  Surgeon: Leonie Man, MD;  Location: [place];  Service: Cardiovascular;; mCx ~65% - DFR 0.92, FR 0.82 - BORDERLINE--> MED Rx  . LEFT HEART CATH AND CORONARY ANGIOGRAPHY N/A 02/26/2019   Procedure: LEFT HEART CATH AND CORONARY ANGIOGRAPHY;  Surgeon: Leonie Man, MD;  Location: [place] [facility];  EF 55-65%. mCx 65% (FFR 0.82 - Med Rx). D2 85% - Wolverine Scoring Balloon PTCA (2.0 mm). Ost rPDA - 90% -Resolute Onyx 2.5 x 15 (2.6 mm).   . LEFT HEART CATH AND CORONARY ANGIOGRAPHY N/A 07/05/2019   Procedure: LEFT HEART CATH AND CORONARY ANGIOGRAPHY;  Surgeon: Leonie Man, MD;  Location: [facility] IN[facility]AS[facility];; ostial D1 95% (restenosis of PTCA site) -> DES PCI.  PDA stent widely patent.  Mid CX stable 65% lesion.  Proximal RCA 40%.  Mid RCA 45%.  Normal EDP.  [name] 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  . SHOULDER ARTHROSCOPY Bilateral   . SHOULDER ARTHROSCOPY W/ ROTATOR CUFF REPAIR Bilateral 2004-2009   left  2004, right 2009  . TOTAL HIP ARTHROPLASTY Left 03/04/2020   Procedure: TOTAL HIP ARTHROPLASTY ANTERIOR APPROACH;  Surgeon: Gaynelle Arabian, MD;  Location: [facility];  Service: Orthopedics;  Laterality: Left;  114min  . VASECTOMY    . WISDOM TOOTH EXTRACTION     Family History  Problem Relation Age of Onset  . Hypertension Mother   . Hypertension Father   . Arthritis Sister        rheumatoid  . Stroke Sister 108   Social History   Socioeconomic History  . Marital status: Married    Spouse name: Not on file  . Number of children: Not on file  . Years of education: Not on file  . Highest education level: Not on file  Occupational History  . Not on file  Tobacco Use  . Smoking status: Former Smoker    Packs/day: 1.00    Years: 5.00    Pack years: 5.00    Types: Cigarettes    Quit date: 10/17/1968    Years since quitting: 51.6    . Smokeless tobacco: Never Used  Vaping Use  . Vaping Use: Never used  Substance and Sexual Activity  . Alcohol use: Not Currently    Comment: "quit drinking ~ 10/1968  . Drug use: No  . Sexual activity: Not on file  Other Topics Concern  . Not on file  Social History Narrative  . Not on file   Social Determinants of Health   Financial Resource Strain: Low Risk   . Difficulty of Paying Living Expenses: Not hard at all  Food Insecurity: No Food Insecurity  . Worried About Charity fundraiser in the Last Year: Never true  . Ran Out of Food in the Last Year: Never true  Transportation Needs: No Transportation Needs  . Lack of Transportation (Medical): No  . Lack of Transportation (Non-Medical): No  Physical Activity: Sufficiently Active  . Days of Exercise per Week: 7 days  . Minutes of Exercise per Session: 30 min  Stress: No Stress Concern Present  . Feeling of Stress : Not at all  Social Connections: Moderately Integrated  . Frequency of Communication with Friends and Family: More than three times a week  . Frequency of Social Gatherings with Friends and Family: More than three times a week  . Attends Religious Services: More than 4 times per year  . Active Member of Clubs or Organizations: No  . Attends Archivist Meetings: Never  . Marital Status: Married    Tobacco Counseling Counseling given: Not Answered   Clinical Intake:  Pre-visit preparation completed: Yes  Pain : No/denies pain     Nutritional Risks: None Diabetes: No  How often do you need to have someone help you when you read instructions, pamphlets, or other written materials from your doctor or pharmacy?: 1 - Never What is the last grade level you completed in school?: College  Diabetic?No  Interpreter Needed?: No  Information entered by :: [place] of Daily Living In your present state of health, do you have any difficulty performing the following activities:  05/15/2020 03/04/2020  Hearing? Y N  Comment Has difficulties hearing has to have tv up louder.Has seen audiologist. Has hearing aids does not wear them often -  Vision? N N  Difficulty concentrating or making decisions? N N  Walking or climbing stairs? N Y  Dressing or bathing? N N  Doing errands, shopping? N Y  Conservation officer, nature and eating ?  N -  Using the Toilet? N -  In the past six months, have you accidently leaked urine? N -  Do you have problems with loss of bowel control? N -  Managing your Medications? N -  Managing your Finances? N -  Housekeeping or managing your Housekeeping? N -  Some recent data might be hidden    Patient Care Team: Eulas Post, MD as PCP - General Leonie Man, MD as PCP - Cardiology (Cardiology) Kristeen Miss, MD as Consulting Physician (Neurosurgery) Garald Balding, MD as Consulting Physician (Orthopedic Surgery) Viona Gilmore, [facility] as Pharmacist (Pharmacist)  Indicate any recent Medical Services you may have received from other than [facility] providers in the past year (date may be approximate).     Assessment:   This is a routine wellness examination for [place].  Hearing/Vision screen  Hearing Screening   125Hz  250Hz  500Hz  1000Hz  2000Hz  3000Hz  4000Hz  6000Hz  8000Hz   Right ear:           Left ear:           Vision Screening Comments: Patient states gets eyes examined 3- 4 times a year . Has glaucoma and cataracts  Dietary issues and exercise activities discussed: Current Exercise Habits: Home exercise routine, Type of exercise: walking, Time (Minutes): 30, Frequency (Times/Week): 7, Weekly Exercise (Minutes/Week): 210, Intensity: Mild  Goals    . LDL CALC < 70    . Patient Stated     Quit work; develop a Mining engineer     . Patient Stated     I will continue to walk daily.      Depression Screen PHQ 2/9 Scores 05/15/2020 05/06/2020 05/06/2019 04/04/2019 05/02/2018 05/02/2018 04/18/2017  PHQ - 2 Score 0 0 0 0 0 0 0  PHQ- 9 Score  0 4 0 - - - -    Fall Risk Fall Risk  05/15/2020 05/06/2020 05/06/2019 04/04/2019 05/02/2018  Falls in the past year? 0 0 0 0 No  Number falls in past yr: 0 0 - 0 -  Injury with Fall? 0 0 - 0 -  Follow up Falls evaluation completed;Falls prevention discussed - - Falls evaluation completed -    Any stairs in or around the home? No  If so, are there any without handrails? No  Home free of loose throw rugs in walkways, pet beds, electrical cords, etc? Yes  Adequate lighting in your home to reduce risk of falls? Yes   ASSISTIVE DEVICES UTILIZED TO PREVENT FALLS:  Life alert? No  Use of a cane, walker or w/c? No  Grab bars in the bathroom? No  Shower chair or bench in shower? No  Elevated toilet seat or a handicapped toilet? No    Cognitive Function: Cognitive screening not indicated based on direct observation MMSE - Mini Mental State Exam 05/02/2018  Not completed: (No Data)        Immunizations Immunization History  Administered Date(s) Administered  . Fluad Quad(high Dose 65+) 05/06/2019, 05/06/2020  . Influenza Split 05/15/2012  . Influenza Whole 06/15/2010  . Influenza, High Dose Seasonal PF 04/15/2016, 04/18/2017, 05/02/2018  . Influenza,inj,Quad PF,6+ Mos 08/02/2013, 05/05/2014  . Influenza,inj,quad, With Preservative 05/16/2019  . Influenza-Unspecified 07/30/2015  . PFIZER SARS-COV-2 Vaccination 08/30/2019, 09/20/2019  . Pneumococcal Conjugate-13 09/27/2013  . Pneumococcal Polysaccharide-23 09/15/2008, 04/16/2015  . Td 08/16/2003  . Tdap 05/05/2014  . Zoster 06/17/2011  . Zoster Recombinat (Shingrix) 10/14/2018, 10/14/2018, 03/20/2019, 03/20/2019    TDAP status: Up to date Flu Vaccine  status: Up to date Pneumococcal vaccine status: Up to date Covid-19 vaccine status: Completed vaccines  Qualifies for Shingles Vaccine? Yes   Zostavax completed Yes   Shingrix Completed?: Yes  Screening Tests Health Maintenance  Topic Date Due  . OPHTHALMOLOGY EXAM   03/30/2019  . FOOT EXAM  05/03/2019  . HEMOGLOBIN A1C  11/03/2020  . TETANUS/TDAP  05/05/2024  . COLONOSCOPY  02/14/2026  . INFLUENZA VACCINE  Completed  . COVID-19 Vaccine  Completed  . Hepatitis C Screening  Completed  . PNA vac Low Risk Adult  Completed    Health Maintenance  Health Maintenance Due  Topic Date Due  . OPHTHALMOLOGY EXAM  03/30/2019  . FOOT EXAM  05/03/2019    Colorectal cancer screening: Completed 02/15/2016. Repeat every 10 years  Lung Cancer Screening: (Low Dose CT Chest recommended if Age 46-80 years, 30 pack-year currently smoking OR have quit w/in 15years.) does not qualify.   Lung Cancer Screening Referral: N/A  Additional Screening:  Hepatitis C Screening: does qualify; Completed 02/26/2014  Vision Screening: Recommended annual ophthalmology exams for early detection of glaucoma and other disorders of the eye. Is the patient up to date with their annual eye exam?  Yes  Who is the provider or what is the name of the office in which the patient attends annual eye exams? [facility]  If pt is not established with a provider, would they like to be referred to a provider to establish care? No .   Dental Screening: Recommended annual dental exams for proper oral hygiene  Community Resource Referral / Chronic Care Management: CRR required this visit?  No   CCM required this visit?  No      Plan:     I have personally reviewed and noted the following in the patient's chart:   . Medical and social history . Use of alcohol, tobacco or illicit drugs  . Current medications and supplements . Functional ability and status . Nutritional status . Physical activity . Advanced directives . List of other physicians . Hospitalizations, surgeries, and ER visits in previous 12 months . Vitals . Screenings to include cognitive, depression, and falls . Referrals and appointments  In addition, I have reviewed and discussed with patient certain  preventive protocols, quality metrics, and best practice recommendations. A written personalized care plan for preventive services as well as general preventive health recommendations were provided to patient.     Ofilia Neas, LPN   [id number]   Nurse Notes: None

## 2020-05-15 NOTE — Patient Instructions (Signed)
Luis Guzman , Thank you for taking time to come for your Medicare Wellness Visit. I appreciate your ongoing commitment to your health goals. Please review the following plan we discussed and let me know if I can assist you in the future.   Screening recommendations/referrals: Colonoscopy: Up to date, next due 02/14/2026 Recommended yearly ophthalmology/optometry visit for glaucoma screening and checkup Recommended yearly dental visit for hygiene and checkup  Vaccinations: Influenza vaccine: Up to date, next due fall 2022 Pneumococcal vaccine: Completed series Tdap vaccine: Up to date, next due 05/05/2014 Shingles vaccine: Completed series    Advanced directives: Please bring copies of your medical advanced directives to the our office so that we may scan them into your chart  Conditions/risks identified: None  Next appointment: 06/09/2020 @ 9:00 am with Pharmacist via telephone   Preventive Care 65 Years and Older, Male Preventive care refers to lifestyle choices and visits with your health care provider that can promote health and wellness. What does preventive care include?  A yearly physical exam. This is also called an annual well check.  Dental exams once or twice a year.  Routine eye exams. Ask your health care provider how often you should have your eyes checked.  Personal lifestyle choices, including:  Daily care of your teeth and gums.  Regular physical activity.  Eating a healthy diet.  Avoiding tobacco and drug use.  Limiting alcohol use.  Practicing safe sex.  Taking low doses of aspirin every day.  Taking vitamin and mineral supplements as recommended by your health care provider. What happens during an annual well check? The services and screenings done by your health care provider during your annual well check will depend on your age, overall health, lifestyle risk factors, and family history of disease. Counseling  Your health care provider may ask you  questions about your:  Alcohol use.  Tobacco use.  Drug use.  Emotional well-being.  Home and relationship well-being.  Sexual activity.  Eating habits.  History of falls.  Memory and ability to understand (cognition).  Work and work Statistician. Screening  You may have the following tests or measurements:  Height, weight, and BMI.  Blood pressure.  Lipid and cholesterol levels. These may be checked every 5 years, or more frequently if you are over 106 years old.  Skin check.  Lung cancer screening. You may have this screening every year starting at age 52 if you have a 30-pack-year history of smoking and currently smoke or have quit within the past 15 years.  Fecal occult blood test (FOBT) of the stool. You may have this test every year starting at age 39.  Flexible sigmoidoscopy or colonoscopy. You may have a sigmoidoscopy every 5 years or a colonoscopy every 10 years starting at age 70.  Prostate cancer screening. Recommendations will vary depending on your family history and other risks.  Hepatitis C blood test.  Hepatitis B blood test.  Sexually transmitted disease (STD) testing.  Diabetes screening. This is done by checking your blood sugar (glucose) after you have not eaten for a while (fasting). You may have this done every 1-3 years.  Abdominal aortic aneurysm (AAA) screening. You may need this if you are a current or former smoker.  Osteoporosis. You may be screened starting at age 13 if you are at high risk. Talk with your health care provider about your test results, treatment options, and if necessary, the need for more tests. Vaccines  Your health care provider may recommend certain vaccines,  such as:  Influenza vaccine. This is recommended every year.  Tetanus, diphtheria, and acellular pertussis (Tdap, Td) vaccine. You may need a Td booster every 10 years.  Zoster vaccine. You may need this after age 33.  Pneumococcal 13-valent conjugate  (PCV13) vaccine. One dose is recommended after age 21.  Pneumococcal polysaccharide (PPSV23) vaccine. One dose is recommended after age 107. Talk to your health care provider about which screenings and vaccines you need and how often you need them. This information is not intended to replace advice given to you by your health care provider. Make sure you discuss any questions you have with your health care provider. Document Released: 08/28/2015 Document Revised: 04/20/2016 Document Reviewed: 06/02/2015 Elsevier Interactive Patient Education  2017 [place] Prevention in the Home Falls can cause injuries. They can happen to people of all ages. There are many things you can do to make your home safe and to help prevent falls. What can I do on the outside of my home?  Regularly fix the edges of walkways and driveways and fix any cracks.  Remove anything that might make you trip as you walk through a door, such as a raised step or threshold.  Trim any bushes or trees on the path to your home.  Use bright outdoor lighting.  Clear any walking paths of anything that might make someone trip, such as rocks or tools.  Regularly check to see if handrails are loose or broken. Make sure that both sides of any steps have handrails.  Any raised decks and porches should have guardrails on the edges.  Have any leaves, snow, or ice cleared regularly.  Use sand or salt on walking paths during winter.  Clean up any spills in your garage right away. This includes oil or grease spills. What can I do in the bathroom?  Use night lights.  Install grab bars by the toilet and in the tub and shower. Do not use towel bars as grab bars.  Use non-skid mats or decals in the tub or shower.  If you need to sit down in the shower, use a plastic, non-slip stool.  Keep the floor dry. Clean up any water that spills on the floor as soon as it happens.  Remove soap buildup in the tub or shower  regularly.  Attach bath mats securely with double-sided non-slip rug tape.  Do not have throw rugs and other things on the floor that can make you trip. What can I do in the bedroom?  Use night lights.  Make sure that you have a light by your bed that is easy to reach.  Do not use any sheets or blankets that are too big for your bed. They should not hang down onto the floor.  Have a firm chair that has side arms. You can use this for support while you get dressed.  Do not have throw rugs and other things on the floor that can make you trip. What can I do in the kitchen?  Clean up any spills right away.  Avoid walking on wet floors.  Keep items that you use a lot in easy-to-reach places.  If you need to reach something above you, use a strong step stool that has a grab bar.  Keep electrical cords out of the way.  Do not use floor polish or wax that makes floors slippery. If you must use wax, use non-skid floor wax.  Do not have throw rugs and other things on  the floor that can make you trip. What can I do with my stairs?  Do not leave any items on the stairs.  Make sure that there are handrails on both sides of the stairs and use them. Fix handrails that are broken or loose. Make sure that handrails are as long as the stairways.  Check any carpeting to make sure that it is firmly attached to the stairs. Fix any carpet that is loose or worn.  Avoid having throw rugs at the top or bottom of the stairs. If you do have throw rugs, attach them to the floor with carpet tape.  Make sure that you have a light switch at the top of the stairs and the bottom of the stairs. If you do not have them, ask someone to add them for you. What else can I do to help prevent falls?  Wear shoes that:  Do not have high heels.  Have rubber bottoms.  Are comfortable and fit you well.  Are closed at the toe. Do not wear sandals.  If you use a stepladder:  Make sure that it is fully  opened. Do not climb a closed stepladder.  Make sure that both sides of the stepladder are locked into place.  Ask someone to hold it for you, if possible.  Clearly mark and make sure that you can see:  Any grab bars or handrails.  First and last steps.  Where the edge of each step is.  Use tools that help you move around (mobility aids) if they are needed. These include:  Canes.  Walkers.  Scooters.  Crutches.  Turn on the lights when you go into a dark area. Replace any light bulbs as soon as they burn out.  Set up your furniture so you have a clear path. Avoid moving your furniture around.  If any of your floors are uneven, fix them.  If there are any pets around you, be aware of where they are.  Review your medicines with your doctor. Some medicines can make you feel dizzy. This can increase your chance of falling. Ask your doctor what other things that you can do to help prevent falls. This information is not intended to replace advice given to you by your health care provider. Make sure you discuss any questions you have with your health care provider. Document Released: 05/28/2009 Document Revised: 01/07/2016 Document Reviewed: 09/05/2014 Elsevier Interactive Patient Education  2017 [employer].

## 2020-05-25 ENCOUNTER — Ambulatory Visit (INDEPENDENT_AMBULATORY_CARE_PROVIDER_SITE_OTHER): Payer: Medicare Other | Admitting: Cardiology

## 2020-05-25 ENCOUNTER — Other Ambulatory Visit: Payer: Self-pay

## 2020-05-25 ENCOUNTER — Encounter: Payer: Self-pay | Admitting: Cardiology

## 2020-05-25 VITALS — BP 111/74 | HR 96 | Ht 73.0 in | Wt 189.2 lb

## 2020-05-25 DIAGNOSIS — I2510 Atherosclerotic heart disease of native coronary artery without angina pectoris: Secondary | ICD-10-CM

## 2020-05-25 DIAGNOSIS — Z9861 Coronary angioplasty status: Secondary | ICD-10-CM | POA: Diagnosis not present

## 2020-05-25 DIAGNOSIS — Z01810 Encounter for preprocedural cardiovascular examination: Secondary | ICD-10-CM

## 2020-05-25 DIAGNOSIS — I10 Essential (primary) hypertension: Secondary | ICD-10-CM

## 2020-05-25 DIAGNOSIS — I209 Angina pectoris, unspecified: Secondary | ICD-10-CM | POA: Diagnosis not present

## 2020-05-25 DIAGNOSIS — I714 Abdominal aortic aneurysm, without rupture, unspecified: Secondary | ICD-10-CM

## 2020-05-25 DIAGNOSIS — R5383 Other fatigue: Secondary | ICD-10-CM | POA: Diagnosis not present

## 2020-05-25 DIAGNOSIS — E785 Hyperlipidemia, unspecified: Secondary | ICD-10-CM

## 2020-05-25 DIAGNOSIS — I25119 Atherosclerotic heart disease of native coronary artery with unspecified angina pectoris: Secondary | ICD-10-CM

## 2020-05-25 DIAGNOSIS — R0600 Dyspnea, unspecified: Secondary | ICD-10-CM | POA: Diagnosis not present

## 2020-05-25 DIAGNOSIS — R0609 Other forms of dyspnea: Secondary | ICD-10-CM

## 2020-05-25 MED ORDER — CLOPIDOGREL BISULFATE 75 MG PO TABS: 75.0000 mg | ORAL_TABLET | Freq: Every day | ORAL | 3 refills | Status: DC

## 2020-05-25 MED ORDER — VALSARTAN 320 MG PO TABS: 160.0000 mg | ORAL_TABLET | Freq: Every day | ORAL | 3 refills | Status: DC

## 2020-05-25 MED ORDER — NITROGLYCERIN 0.4 MG SL SUBL: 0.4000 mg | SUBLINGUAL_TABLET | SUBLINGUAL | 3 refills | Status: DC | PRN

## 2020-05-25 MED ORDER — HYDROCHLOROTHIAZIDE 25 MG PO TABS: 25.0000 mg | ORAL_TABLET | Freq: Every day | ORAL | 3 refills | Status: DC

## 2020-05-25 NOTE — Progress Notes (Addendum)
Primary Care Provider: Eulas Post, MD Cardiologist: Glenetta Hew, MD Electrophysiologist: None  Orthopedic surgeon: Dr. Wynelle Link  Clinic Note: Chief Complaint  Patient presents with  . Follow-up    4 months; he is now s/p hip surgery  . Coronary Artery Disease    No further angina    HPI:    Luis Guzman is a 74 y.o. male with a PMH notable for CAD-PCI to first the PDA followed by diagonal after failed attempted PTCA only.  He presents today for routine 73-month follow-up preop evaluation for hip surgery (total hip arthroplasty).  ERIAN Guzman was last seen on October 17, 2018 via telemedicine.  He is doing well from a cardiac standpoint.  Limited mostly by his hip pain.  He had one episode of chest discomfort that occurred at rest but not with exertion.  Was noting that the hip injections are not lasting as long. --> Noted that was okay for him to hold his platelet agents for upcoming hip injections desire to wait until 6 months post PCI, but otherwise intended to continue on Plavix until November 2021.  Recent Hospitalizations:   03/04/2020 LEFT HIP TOTAL HIP ARTHROPLASTY  Reviewed  CV studies:    The following studies were reviewed today: (if available, images/films reviewed: From Epic Chart or Care Everywhere) . Echocardiogram 02/14/2020: EF 55 to 60%.  GR 1 DD.  No R WMA.  Trivial MR.  Otherwise normal.  Interval History:   Luis Guzman returns today for his first postop follow-up.  He says that the hip feels great.  He is enjoying walking without having significant pain.  The right hip still bothers him but it actually feels better now that the left has been repaired.  The biggest thing he notes is that he just gets out of breath quickly and feels tired and fatigued if he exerts himself in the heat.  In the cool today he is able to do a lot more.  For instance, last weekend he was out cutting shrubbery in the yard, unfortunately during the heat of the day, and he felt just  worn out and fatigue.  He had to stop to go inside to rest where he was able to catch his breath and cooldown.  He then went back back outside and was able to complete his chores.  He has not had any of his anginal symptoms since his last PCI.  The exertional dyspnea actually seems to be improving the more he is actually able to do things.  Rare palpitations noted but nothing prolonged.  Cardiovascular ROS: positive for - dyspnea on exertion, palpitations and Exertional fatigue and heat intolerance-having but overall improved negative for - edema, orthopnea, paroxysmal nocturnal dyspnea, rapid heart rate, shortness of breath or Syncope/near syncope or TIA/amaurosis fugax, claudication -> he is actually able to walk now gradually building up, she does.  No claudication.  The patient does not have symptoms concerning for COVID-19 infection (fever, chills, cough, or new shortness of breath).  The patient is practicing social distancing.   COVID-19 vaccine: Pfizer-January and February  -> he is working to schedule his booster shot  REVIEWED OF SYSTEMS   Review of Systems  Constitutional: Positive for malaise/fatigue (Tired at the end of the day; exercise intolerance, mostly in the heat). Negative for weight loss.  HENT: Negative for congestion and nosebleeds.   Respiratory: Positive for shortness of breath (Per HPI). Negative for cough.   Cardiovascular: Negative for leg swelling.  Gastrointestinal: Negative for abdominal pain, blood in stool and melena.  Genitourinary: Positive for frequency. Negative for hematuria.  Musculoskeletal: Positive for joint pain (Left hip now feels great, some soreness in the right but much improved.).  Neurological: Negative for dizziness, focal weakness and headaches.  Endo/Heme/Allergies: Bruises/bleeds easily.  Psychiatric/Behavioral: Negative for memory loss. The patient is not nervous/anxious and does not have insomnia.    I have reviewed and (if needed)  personally updated the patient's problem list, medications, allergies, past medical and surgical history, social and family history.   PAST MEDICAL HISTORY   Past Medical History:  Diagnosis Date  . AAA (abdominal aortic aneurysm) ([place])   . Anemia    as infant history of  . CAD S/P PTCA & DES PCI - for Progressive Angina 02/26/2019   Cath-PCI 02/26/2019: EF 55-65%. mCx 65% (FFR 0.82 - Med Rx). D2 85% - Wolverine Scoring Balloon PTCA (2.0 mm). Ost rPDA - 90% -Resolute Onyx 2.5 x 15 (2.6 mm). --> 06/2019: staged DES PCI ost D1 (RESOLUTE ONYX 2.25 mm x 50 mm (2.6 mm) positioned to avoid the true ostium)  . Elevated PSA   . GERD (gastroesophageal reflux disease)   . History of diabetes mellitus, type II    loss weight under control currently  . History of hiatal hernia    40 yrs ago  . History of pancreatitis    elevated lipase levels   . HYPERLIPIDEMIA 01/26/2009  . HYPERTENSION 01/26/2009  . 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 lower back pain, s/p cervical neck surgery"stiffness" remains  . Transfusion history    infant "anemia"    PAST SURGICAL HISTORY   Past Surgical History:  Procedure Laterality Date  . APPENDECTOMY  ~ 1959  . BACK SURGERY    . CERVICAL DISC ARTHROPLASTY N/A 07/30/2018   Procedure: Cervical five-six Cervical six-seven Artificial disc replacement;  Surgeon: Kristeen Miss, MD;  Location: [facility];  Service: Neurosurgery;  Laterality: N/A;  . CERVICAL LAMINECTOMY  1997   C 4 and C 5  . CHOLECYSTECTOMY N/A 04/15/2014   Procedure: LAPAROSCOPIC CHOLECYSTECTOMY WITH INTRAOPERATIVE CHOLANGIOGRAM;  Surgeon: Armandina Gemma, MD;  Location: [facility];  Service: General;  Laterality: N/A;  . COLONOSCOPY    . CORONARY BALLOON ANGIOPLASTY N/A 02/26/2019   Procedure: CORONARY BALLOON ANGIOPLASTY;  Surgeon: Leonie Man, MD;  Location: [facility];  Service: Cardiovascular -> scoring balloon PTCA (Wolverine 2.0 mm) of ostial D2 85% reducing to 20-30%.   . CORONARY STENT INTERVENTION N/A 02/26/2019   Procedure: CORONARY STENT INTERVENTION;  Surgeon: Leonie Man, MD;  Location: [facility];; PCI ostial RPDA (initial attempt of PTCA only led to small local tear/dissection covered with stent) Resolute Onyx 2.5 mm x 15 mm (2.6 mm  . CORONARY STENT INTERVENTION N/A 07/05/2019   Procedure: CORONARY STENT INTERVENTION;  Surgeon: Leonie Man, MD;  Location: [facility];  Service: Cardiovascular;; ostial D1 95% (restenosis of PTCA site) -> DES PCI RESOLUTE ONYX 2.25 mm x 50 mm (2.6 mm) positioned to avoid the true ostium.   . ESOPHAGOGASTRODUODENOSCOPY N/A 03/13/2014   Procedure: ESOPHAGOGASTRODUODENOSCOPY (EGD);  Surgeon: Wonda Horner, MD;  Location: [facility];  Service: Endoscopy;  Laterality: N/A;  . INTRAVASCULAR PRESSURE WIRE/FFR STUDY N/A 02/26/2019   Procedure: INTRAVASCULAR PRESSURE WIRE/FFR STUDY;  Surgeon: Leonie Man, MD;  Location: [place] [facility];  Service: Cardiovascular;; mCx ~65% - DFR 0.92, FR 0.82 - BORDERLINE--> MED Rx  .  LEFT HEART CATH AND CORONARY ANGIOGRAPHY N/A 02/26/2019   Procedure: LEFT HEART CATH AND CORONARY ANGIOGRAPHY;  Surgeon: Leonie Man, MD;  Location: [place];  EF 55-65%. mCx 65% (FFR 0.82 - Med Rx). D2 85% - Wolverine Scoring Balloon PTCA (2.0 mm). Ost rPDA - 90% -Resolute Onyx 2.5 x 15 (2.6 mm).   . LEFT HEART CATH AND CORONARY ANGIOGRAPHY N/A 07/05/2019   Procedure: LEFT HEART CATH AND CORONARY ANGIOGRAPHY;  Surgeon: Leonie Man, MD;  Location: [facility];; ostial D1 95% (restenosis of PTCA site) -> DES PCI.  PDA stent widely patent.  Mid CX stable 65% lesion.  Proximal RCA 40%.  Mid RCA 45%.  Normal EDP.  [name] 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  . SHOULDER ARTHROSCOPY Bilateral   . SHOULDER ARTHROSCOPY W/ ROTATOR CUFF REPAIR Bilateral 2004-2009   left 2004, right 2009  . TOTAL HIP ARTHROPLASTY Left 03/04/2020   Procedure: TOTAL HIP  ARTHROPLASTY ANTERIOR APPROACH;  Surgeon: Gaynelle Arabian, MD;  Location: [facility];  Service: Orthopedics;  Laterality: Left;  126min  . TRANSTHORACIC ECHOCARDIOGRAM  02/2020   EF 55 to 60%.  GR 1 DD.  No R WMA.  Trivial MR.  Otherwise normal.  . VASECTOMY    . WISDOM TOOTH EXTRACTION      Cath- PCI 07/05/2019: ostial D1 95% (restenosis of PTCA site) -> DES PCI RESOLUTE ONYX 2.25 mm x 50 mm (2.6 mm) positioned to avoid the true ostium.  PDA stent widely patent.  Mid CX stable 65% lesion.  Proximal RCA 40%.  Mid RCA 45%. Normal EDP.    MEDICATIONS/ALLERGIES   Current Meds  Medication Sig  . bimatoprost (LUMIGAN) 0.03 % ophthalmic solution Place 1 drop into both eyes at bedtime.  . Brinzolamide-Brimonidine (SIMBRINZA) 1-0.2 % SUSP Place 1 drop into both eyes 2 (two) times daily.   . clopidogrel (PLAVIX) 75 MG tablet Take 1 tablet (75 mg total) by mouth daily.  . Evolocumab (REPATHA SURECLICK) 353 MG/ML SOAJ Inject 140 mg into the skin every 14 (fourteen) days.  . nitroGLYCERIN (NITROSTAT) 0.4 MG SL tablet Place 1 tablet (0.4 mg total) under the tongue every 5 (five) minutes as needed for chest pain.  . traZODone (DESYREL) 50 MG tablet Take 0.5-1 tablets (25-50 mg total) by mouth at bedtime as needed for sleep.  . valsartan (DIOVAN) 320 MG tablet Take 0.5 tablets (160 mg total) by mouth daily.  . [DISCONTINUED] aspirin EC 81 MG tablet Take 81 mg by mouth daily. Swallow whole.  . [DISCONTINUED] clopidogrel (PLAVIX) 75 MG tablet TAKE 1 TABLET BY MOUTH  DAILY  . [DISCONTINUED] clopidogrel (PLAVIX) 75 MG tablet Take 1 tablet (75 mg total) by mouth daily.  . [DISCONTINUED] isosorbide mononitrate (IMDUR) 30 MG 24 hr tablet TAKE 1 TABLET BY MOUTH IN  THE EVENING  . [DISCONTINUED] nitroGLYCERIN (NITROSTAT) 0.4 MG SL tablet Place 1 tablet (0.4 mg total) under the tongue every 5 (five) minutes as needed for chest pain.  . [DISCONTINUED] valsartan (DIOVAN) 320 MG tablet Take 160 mg by mouth daily.     Allergies  Allergen Reactions  . Penicillins Itching    Has patient had a PCN reaction causing immediate rash, facial/tongue/throat swelling, SOB or lightheadedness with hypotension: no Has patient had a PCN reaction causing severe rash involving mucus membranes or skin necrosis: unkn Has patient had a PCN reaction that required hospitalization: no Has patient had a PCN reaction occurring within the last 10 years:  no If all of the above answers are "NO", then may proceed with Cephalosporin use.  Tolerated Cephalosporin Date: 03/04/20.       SOCIAL HISTORY/FAMILY HISTORY   Reviewed in Epic:  Pertinent findings: no new changes  OBJCTIVE -PE, EKG, labs   Wt Readings from Last 3 Encounters:  05/25/20 189 lb 3.2 oz (85.8 kg)  05/06/20 189 lb 3.2 oz (85.8 kg)  03/04/20 190 lb 11.2 oz (86.5 kg)    Physical Exam: BP 111/74   Pulse 96   Ht 6\' 1"  (1.854 m)   Wt 189 lb 3.2 oz (85.8 kg)   SpO2 96%   BMI 24.96 kg/m  Physical Exam Constitutional:      General: He is not in acute distress.    Appearance: Normal appearance. He is normal weight.  HENT:     Head: Normocephalic and atraumatic.  Neck:     Vascular: Normal carotid pulses. No carotid bruit, hepatojugular reflux or JVD.  Cardiovascular:     Rate and Rhythm: Normal rate and regular rhythm.  No extrasystoles are present.    Chest Wall: PMI is not displaced.     Pulses: Normal pulses.     Heart sounds: Normal heart sounds. No murmur heard.  No friction rub. No gallop.   Pulmonary:     Effort: Pulmonary effort is normal. No respiratory distress.     Breath sounds: Normal breath sounds. No wheezing, rhonchi or rales.  Musculoskeletal:        General: No swelling or tenderness. Normal range of motion.     Cervical back: Normal range of motion and neck supple.  Neurological:     General: No focal deficit present.     Mental Status: He is alert and oriented to person, place, and time.  Psychiatric:        Mood and  Affect: Mood normal.        Behavior: Behavior normal.        Thought Content: Thought content normal.        Judgment: Judgment normal.     Adult ECG Report  Rate: 76;  Rhythm: normal sinus rhythm; normal axis, normal durations  Narrative Interpretation: Normal EKG  Recent Labs:    Lab Results  Component Value Date   CHOL 176 05/06/2020   HDL 51 05/06/2020   LDLCALC 98 05/06/2020   LDLDIRECT 129.0 08/19/2016   TRIG 178 (H) 05/06/2020   CHOLHDL 3.5 05/06/2020   Lab Results  Component Value Date   CREATININE 0.94 05/06/2020   BUN 15 05/06/2020   NA 141 05/06/2020   K 3.5 05/06/2020   CL 104 05/06/2020   CO2 27 05/06/2020   Lab Results  Component Value Date   TSH 0.61 05/06/2019    ASSESSMENT/PLAN    Problem List Items Addressed This Visit    Angina, class III (HCC) (Chronic)    No further angina following his second PCI. I think at this point we can probably stop Imdur to see if his symptoms are truly improved.  He is not on a beta-blocker because of his fatigue.  But now his rate is up, we may be able to consider future.      Relevant Medications   nitroGLYCERIN (NITROSTAT) 0.4 MG SL tablet   valsartan (DIOVAN) 320 MG tablet   hydrochlorothiazide (HYDRODIURIL) 25 MG tablet   CAD S/P & DES PCI -ostial PDA and D1 (Chronic)    He has had PCI to relatively small branches.  There is  an existing OM branch with 65% stenosis being treated medically.  Plan: As of tomorrow 30th, DC aspirin and continue clopidogrel along.  Okay to hold Plavix 5 days preop for surgeries -> depending on the surgeon, if agreeable, would prefer to replace Plavix with aspirin 81 mg daily while off of Plavix.      Relevant Medications   nitroGLYCERIN (NITROSTAT) 0.4 MG SL tablet   valsartan (DIOVAN) 320 MG tablet   hydrochlorothiazide (HYDRODIURIL) 25 MG tablet   Other Relevant Orders   Lipid panel   Comprehensive metabolic panel   Coronary artery disease involving native coronary  artery of native heart with angina pectoris (HCC) - Primary (Chronic)    S/p DES PCI of RPA with initial PTCA only of D1 that did not hold.  Eventually had DES PCI to D1 in November 2020.  No longer having angina, still has exertional dyspnea, but has been deconditioned.  Plan:   Not on beta-blocker because of fatigue.  On statin plus valsartan.  Currently on aspirin and Plavix, can stop aspirin and continue monotherapy with Plavix. -->  Okay to hold Plavix 5 to 7 days preop for surgeries or procedures.  When possible, to replace Plavix with a 1 mg aspirin during this perioperative period (surgery dependent)  Plans to DC Imdur      Relevant Medications   nitroGLYCERIN (NITROSTAT) 0.4 MG SL tablet   valsartan (DIOVAN) 320 MG tablet   hydrochlorothiazide (HYDRODIURIL) 25 MG tablet   Other Relevant Orders   Lipid panel   Comprehensive metabolic panel   Hyperlipidemia with target LDL less than 70 - statin intolerant (Chronic)    His lipids definitely look better with LDL down to 98 on recent check.  He is on Repatha and tolerating it well.  However, if next follow-up does not show routine goal, need to consider addition of adjunctive medicine which would potentially be something such as Nexletol.      Relevant Medications   nitroGLYCERIN (NITROSTAT) 0.4 MG SL tablet   valsartan (DIOVAN) 320 MG tablet   hydrochlorothiazide (HYDRODIURIL) 25 MG tablet   Other Relevant Orders   Lipid panel   Comprehensive metabolic panel   Essential hypertension (Chronic)    Blood pressure looks great on current meds.  No change.  He should go tolerate stopping Imdur without having any change in his blood pressure.    Plan continue valsartan plus separate HCTZ--I would like to combine these 2 medications because they separate allows him to hold HCTZ if dehydrated.      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  Should be due to follow-up next year per recommendations      Relevant Medications   nitroGLYCERIN (NITROSTAT) 0.4 MG SL tablet   valsartan (DIOVAN) 320 MG tablet   hydrochlorothiazide (HYDRODIURIL) 25 MG tablet   Fatigue due to treatment (Chronic)    This seemed to improve improved since coming off beta-blocker.  I think a lot of it is really deconditioning.  His heart rate is higher today so we may need to consider the possibility of adding a nondihydropyridine calcium channel blocker      Preop cardiovascular exam    Similar to his previous visit prior to left hip arthroplasty, he was doing well at that time was able to  do more than 4 METS, and now is easily up to the 6-8 METS level. I suspect if once we can get him fully "operational "chronic comes to exercise, the fatigue will improve.  He does not require any further cardiac evaluation at this time.  Echocardiogram looked good.  No anginal symptoms since last PCI.  Hip surgery will be considered intermediate risk.  He would therefore probably be considered to be class I-II risk ranging from 3 to 6%.  No further current evaluation will be needed if he is to undergo right hip arthroplasty within the next 6 months.  After that point would be to be reevaluated but probably would not need any further testing.    Would consider starting low-dose beta-blocker and we initiating the Imdur leading up to surgery that could be weaned off after, provided we have time.  Recommendation:   Proceed to the OR if he continues to be stable over the next 6 months.  Otherwise would need to be evaluated.  Okay to hold Plavix for procedures or surgeries.      DOE (dyspnea on exertion)      COVID-19 Education: The signs and symptoms of COVID-19 were discussed with the patient and how to seek care for testing (follow up  with PCP or arrange E-visit).   The importance of social distancing and COVID-19 vaccination was discussed today.  I spent a total of 26 minutes with the patient. >  50% of the time was spent in direct patient consultation.  Additional time spent with chart review  / charting (studies, outside notes, etc): 8 Total Time: 34 min   Current medicines are reviewed at length with the patient today.  (+/- concerns) none  Notice: This dictation was prepared with Dragon dictation along with smaller phrase technology. Any transcriptional errors that result from this process are unintentional and may not be corrected upon review.  Patient Instructions / Medication Changes & Studies & Tests Ordered   Patient Instructions  Medication Instructions:  STOP TAKING ASPIRIN  THE END OF NOV  30 ,2021 STOP TAKING IMDUR  *If you need a refill on your cardiac medications before your next appointment, please call your pharmacy*   Lab Work: [place] 2022 If you have labs (blood work) drawn today and your tests are completely normal, you will receive your results only by: [name] MyChart Message (if you have MyChart) OR . A paper copy in the mail If you have any lab test that is abnormal or we need to change your treatment, we will call you to review the results.   Testing/Procedures: Not needed   Follow-Up: At [facility], you and your health needs are our priority.  As part of our continuing mission to provide you with exceptional heart care, we have created designated Provider Care Teams.  These Care Teams include your primary Cardiologist (physician) and Advanced Practice Providers (APPs -  Physician Assistants and Nurse Practitioners) who all work together to provide you with the care you need, when you need it.     Your next appointment:   6 month(s)  The format for your next appointment:   In Person  Provider:   Glenetta Hew, MD   ADDENDUM:  Labs from  [facility] 05/26/2020  --> [facility] is managing potassium  Na+ 140, K+ 3.0, Cl- 105, HCO3-29, BUN 13, Cr 1.13, Glu 122, Ca2+ 9.3; AST 22, ALT 21, AlkP 111  CBC: W 10.94, H/H 17.1/49.6,  Plt 261  TC 142, TG 134, HDL 53, LDL 62   Also--he will be converting from Repatha to Praluent per [facility].  He will also be converting from valsartan and 160 mg p.o. to losartan which we will initially start at 50 mg daily but likely increased to 100 mg pending blood pressure evaluation.  Studies Ordered:   Orders Placed This Encounter  Procedures  . Lipid panel  . Comprehensive metabolic panel     Glenetta Hew, M.D., M.S. Interventional Cardiologist   Pager # [phone] Phone # [phone] [address]. [place], [place] [address]   Thank you for choosing Heart[facility] at [facility]!!

## 2020-05-25 NOTE — Patient Instructions (Addendum)
Medication Instructions:  STOP TAKING ASPIRIN  THE END OF NOV  30 ,2021 STOP TAKING IMDUR  *If you need a refill on your cardiac medications before your next appointment, please call your pharmacy*   Lab Work: [place] 2022 If you have labs (blood work) drawn today and your tests are completely normal, you will receive your results only by: [name] MyChart Message (if you have MyChart) OR . A paper copy in the mail If you have any lab test that is abnormal or we need to change your treatment, we will call you to review the results.   Testing/Procedures: Not needed   Follow-Up: At [facility], you and your health needs are our priority.  As part of our continuing mission to provide you with exceptional heart care, we have created designated Provider Care Teams.  These Care Teams include your primary Cardiologist (physician) and Advanced Practice Providers (APPs -  Physician Assistants and Nurse Practitioners) who all work together to provide you with the care you need, when you need it.     Your next appointment:   6 month(s)  The format for your next appointment:   In Person  Provider:   Glenetta Hew, MD

## 2020-05-29 ENCOUNTER — Telehealth: Payer: Self-pay | Admitting: Cardiology

## 2020-05-29 NOTE — Telephone Encounter (Signed)
New Message:      Pt wanted Dr Ellyn Hack to know that the [facility] will refill all of his medicine but the [place]. They said they could do Prevalent.[name] He wants to know if Dr Ellyn Hack think this medicine will be alright to replace the [place]?

## 2020-05-29 NOTE — Telephone Encounter (Signed)
Reviewed information with [facility] pharmacist concerning changing from [place] to [place]. Per Erasmo Downer ,Pharm-D. It will be okay to switch . Patient is aware. Patient states he had labs drawn at  [facility] to check lipids- LDL is down to 62. K+ was 3 started on potassium supplement  -patient will have labs rechecked in one week. RN asked for patient to bring copy of labs so we may have  Results in [facility] chart  Patient verbalized understanding.

## 2020-05-30 ENCOUNTER — Encounter: Payer: Self-pay | Admitting: Cardiology

## 2020-05-30 ENCOUNTER — Ambulatory Visit: Payer: Medicare Other

## 2020-05-30 NOTE — Assessment & Plan Note (Signed)
His lipids definitely look better with LDL down to 98 on recent check.  He is on Repatha and tolerating it well.  However, if next follow-up does not show routine goal, need to consider addition of adjunctive medicine which would potentially be something such as Nexletol.

## 2020-05-30 NOTE — Assessment & Plan Note (Signed)
No further angina following his second PCI. I think at this point we can probably stop Imdur to see if his symptoms are truly improved.  He is not on a beta-blocker because of his fatigue.  But now his rate is up, we may be able to consider future.

## 2020-05-30 NOTE — Assessment & Plan Note (Signed)
Was relatively stable in 2017.  Should be due to follow-up next year per recommendations

## 2020-05-30 NOTE — Assessment & Plan Note (Signed)
This seemed to improve improved since coming off beta-blocker.  I think a lot of it is really deconditioning.  His heart rate is higher today so we may need to consider the possibility of adding a nondihydropyridine calcium channel blocker

## 2020-05-30 NOTE — Assessment & Plan Note (Signed)
Similar to his previous visit prior to left hip arthroplasty, he was doing well at that time was able to do more than 4 METS, and now is easily up to the 6-8 METS level. I suspect if once we can get him fully "operational "chronic comes to exercise, the fatigue will improve.  He does not require any further cardiac evaluation at this time.  Echocardiogram looked good.  No anginal symptoms since last PCI.  Hip surgery will be considered intermediate risk.  He would therefore probably be considered to be class I-II risk ranging from 3 to 6%.  No further current evaluation will be needed if he is to undergo right hip arthroplasty within the next 6 months.  After that point would be to be reevaluated but probably would not need any further testing.    Would consider starting low-dose beta-blocker and we initiating the Imdur leading up to surgery that could be weaned off after, provided we have time.  Recommendation:   Proceed to the OR if he continues to be stable over the next 6 months.  Otherwise would need to be evaluated.  Okay to hold Plavix for procedures or surgeries.

## 2020-05-30 NOTE — Assessment & Plan Note (Signed)
He has had PCI to relatively small branches.  There is an existing OM branch with 65% stenosis being treated medically.  Plan: As of tomorrow 30th, DC aspirin and continue clopidogrel along.  Okay to hold Plavix 5 days preop for surgeries -> depending on the surgeon, if agreeable, would prefer to replace Plavix with aspirin 81 mg daily while off of Plavix.

## 2020-05-30 NOTE — Assessment & Plan Note (Signed)
S/p DES PCI of RPA with initial PTCA only of D1 that did not hold.  Eventually had DES PCI to D1 in November 2020.  No longer having angina, still has exertional dyspnea, but has been deconditioned.  Plan:   Not on beta-blocker because of fatigue.  On statin plus valsartan.  Currently on aspirin and Plavix, can stop aspirin and continue monotherapy with Plavix. -->  Okay to hold Plavix 5 to 7 days preop for surgeries or procedures.  When possible, to replace Plavix with a 1 mg aspirin during this perioperative period (surgery dependent)  Plans to DC Imdur

## 2020-05-30 NOTE — Assessment & Plan Note (Signed)
Blood pressure looks great on current meds.  No change.  He should go tolerate stopping Imdur without having any change in his blood pressure.    Plan continue valsartan plus separate HCTZ--I would like to combine these 2 medications because they separate allows him to hold HCTZ if dehydrated.

## 2020-06-08 ENCOUNTER — Telehealth: Payer: Self-pay | Admitting: Pharmacist

## 2020-06-08 NOTE — Chronic Care Management (AMB) (Signed)
Chronic Care Management Pharmacy Assistant   Name: Luis Guzman  MRN: [id number] DOB: 03-08-1946  Reason for Encounter: Medication Review/Initial Questions for Pharmacist visit on 06/09/2020   Patient Questions: 1. Have you seen any other providers since your last visit? He states he saw the [place] and he also cardiology on 05-25-2020 (Dr. Ellyn Hack) 2. Any changes in your medications or health? Yes, the following medication was discontinued on 05-25-2020: a. Aspirin 81 mg daily b. Isosorbide mononitrate 30 mg daily 3. Any side effects from any medications? No 4. Do you have any symptoms or problems not managed by your medications? No 5. Any concerns about your health right now? No 6. Has your provider asked that you check blood pressure, blood sugar, or follow a special diet at home? He states he takes his blood pressure periodically. 7. Do you get any type of exercise regularly? No 8. Can you think of a goal you would like to reach for your health? No 9. Do you have any problems getting your medications? No 10. Is there anything that you would like to discuss during the appointment? No   PCP : Eulas Post, MD  Allergies:   Allergies  Allergen Reactions  . Penicillins Itching    Has patient had a PCN reaction causing immediate rash, facial/tongue/throat swelling, SOB or lightheadedness with hypotension: no Has patient had a PCN reaction causing severe rash involving mucus membranes or skin necrosis: unkn Has patient had a PCN reaction that required hospitalization: no Has patient had a PCN reaction occurring within the last 10 years: no If all of the above answers are "NO", then may proceed with Cephalosporin use.  Tolerated Cephalosporin Date: 03/04/20.       Medications: Outpatient Encounter Medications as of 06/08/2020  Medication Sig Note  . bimatoprost (LUMIGAN) 0.03 % ophthalmic solution Place 1 drop into both eyes at bedtime.   . Brinzolamide-Brimonidine  (SIMBRINZA) 1-0.2 % SUSP Place 1 drop into both eyes 2 (two) times daily.    . clopidogrel (PLAVIX) 75 MG tablet Take 1 tablet (75 mg total) by mouth daily.   . Evolocumab (REPATHA SURECLICK) 469 MG/ML SOAJ Inject 140 mg into the skin every 14 (fourteen) days. 02/24/2020: On Mondays every 2 weeks  . hydrochlorothiazide (HYDRODIURIL) 25 MG tablet Take 1 tablet (25 mg total) by mouth daily.   . nitroGLYCERIN (NITROSTAT) 0.4 MG SL tablet Place 1 tablet (0.4 mg total) under the tongue every 5 (five) minutes as needed for chest pain.   . traZODone (DESYREL) 50 MG tablet Take 0.5-1 tablets (25-50 mg total) by mouth at bedtime as needed for sleep.   . valsartan (DIOVAN) 320 MG tablet Take 0.5 tablets (160 mg total) by mouth daily.    No facility-administered encounter medications on file as of 06/08/2020.    Current Diagnosis: Patient Active Problem List   Diagnosis Date Noted  . Primary osteoarthritis of left hip 03/04/2020  . DOE (dyspnea on exertion) 02/13/2020  . Pain in left knee 10/30/2019  . Fatigue due to treatment 07/18/2019  . Coronary artery disease involving native coronary artery of native heart with angina pectoris ([place]) 06/28/2019  . Preop cardiovascular exam 05/31/2019  . Hypokalemia 02/27/2019  . CAD S/P & DES PCI -ostial PDA and D1 02/26/2019  . Angina, class III ([place]) 02/14/2019  . Cervical spondylosis with myelopathy 07/30/2018  . AAA (abdominal aortic aneurysm) without rupture ([place]) 04/15/2016  . Biliary dyskinesia 04/08/2014  . Abdominal pain, epigastric 04/08/2014  .  Pancreatitis, acute 03/12/2014  . Pancreatitis 03/12/2014  . Hyperglycemia 06/25/2012  . ACUTE BRONCHITIS 03/11/2010  . 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 SINUSITIS, ACUTE 06/11/2009  . Hyperlipidemia with target LDL less than 70 - statin intolerant 01/26/2009  . Essential hypertension 01/26/2009    Goals Addressed   None     Follow-Up:  Pharmacist Review   Maia Breslow, [place] Assistant [phone]

## 2020-06-09 ENCOUNTER — Ambulatory Visit: Payer: Medicare Other | Admitting: Pharmacist

## 2020-06-09 DIAGNOSIS — I10 Essential (primary) hypertension: Secondary | ICD-10-CM

## 2020-06-09 DIAGNOSIS — E785 Hyperlipidemia, unspecified: Secondary | ICD-10-CM

## 2020-06-09 NOTE — Chronic Care Management (AMB) (Signed)
Chronic Care Management Pharmacy  Name: Luis Guzman  MRN: [id number] DOB: 22-Feb-1946  Initial Planning Appointment: completed 06/08/20  Initial Questions: 1. Have you seen any other providers since your last visit? n/a 2. Any changes in your medicines or health? Yes - aspirin (until end of November) and isosorbide were discontinued  Chief Complaint/ HPI  Luis Guzman,  74 y.o. , male presents for their Initial CCM visit with the clinical pharmacist via telephone due to COVID-19 Pandemic.  PCP : Luis Post, MD  Their chronic conditions include: HTN, HLD, CAD, insomnia, glaucoma  Office Visits: -05/15/20 Luis Neas, LPN: Patient presented for Medicare annual wellness exam.   -05/06/20 Luis Littler, MD: Patient presented for annual exam. A1c is slightly decreased and TGs have increased. Patient reports trouble falling asleep and staying asleep and denies alcohol or late use of caffeine. Patient tried melatonin up to 10 mg with minimal success. Prescribed trazodone.  -02/19/20 Luis Littler, MD: Patient presented for preop clearance for hip replacement. Patient knows to hold Plavix and aspirin for 7 days prior to surgery and resume 2 days after surgery.   Consult Visit: -05/25/20 Luis Hew, MD (cardiology): Patient presented for CAD follow up for preop evaluation for hip surgery. Recommended hold for anti platelet agents for upcoming hip injections desire to wait until 6 months Guzman PCI but otherwise intended to continue on Plavix until Nov 2021. Discontinued aspirin and Imdur.  03/04/20 Luis Asa, MD: Patient presented for total left hip arthroplasty.  02/13/20 Luis Hew, MD (cardiology): Patient presented for follow up preop evaluation for hip surgery. Patient reports some dyspnea and some exertional fatigue. Will wean Toprol off over 2 weeks and increase HCTZ to 25 mg daily.  01/23/20 Luis Guzman (optometry): Patient presented for glaucoma follow up. Unable to  access notes.  01/01/20 Luis Guzman (urology): Patient presented for follow up. Unable to access notes.  Medications: Outpatient Encounter Medications as of 06/09/2020  Medication Sig Note  . bimatoprost (LUMIGAN) 0.03 % ophthalmic solution Place 1 drop into both eyes at bedtime.   . Brinzolamide-Brimonidine (SIMBRINZA) 1-0.2 % SUSP Place 1 drop into both eyes 2 (two) times daily.    . clopidogrel (PLAVIX) 75 MG tablet Take 1 tablet (75 mg total) by mouth daily.   . Evolocumab (REPATHA SURECLICK) 681 MG/ML SOAJ Inject 140 mg into the skin every 14 (fourteen) days. 02/24/2020: On Mondays every 2 weeks  . hydrochlorothiazide (HYDRODIURIL) 25 MG tablet Take 1 tablet (25 mg total) by mouth daily.   [name] KLOR-CON M20 20 MEQ tablet Take 1 tablet by mouth daily.   . nitroGLYCERIN (NITROSTAT) 0.4 MG SL tablet Place 1 tablet (0.4 mg total) under the tongue every 5 (five) minutes as needed for chest pain.   . traZODone (DESYREL) 50 MG tablet Take 0.5-1 tablets (25-50 mg total) by mouth at bedtime as needed for sleep.   . valsartan (DIOVAN) 320 MG tablet Take 0.5 tablets (160 mg total) by mouth daily.    No facility-administered encounter medications on file as of 06/09/2020.   Patient reports spending most of his day running errands and doing yardwork such as mowing yards and trimming shrubs.  Patient lives with his wife at home and his son and family live in [place]. His wife's family is local and he sees them quite often.  Patient and his wife do have a cleaning lady comes in to their house to help out. He primarily does a lot of cooking, especially for Sunday  lunches and they have plenty of leftovers for a few days from that. They do endorse getting take out once a week. His wife spends a lot of time taking care of her mom. At home patient cooks steak or hamburger every couple of weeks, pork chops, roasted vegetables, and eats lots of chicken.   Patient was going to cardiac rehab but dropped off due to  hip replacement earlier this year and had 2 stents last year. He mostly walks around the house and uses a sit down ellipitcal for 30 minutes a day.   Patient reports sleep is doing better but still only ok. Melatonin was inconsistent when he tried to use that. Some nights he get 5-6 hours of sleep but feels very tired at the end of the day, feels exhausted and does not take naps.   Patient denies problems with his medications but was curious if any of them could be causing his fatigue/weakness.   Current Diagnosis/Assessment:  Goals Addressed            This Visit's Progress   . Pharmacy Care Plan       CARE PLAN ENTRY (see longitudinal plan of care for additional care plan information)  Current Barriers:  . Chronic Disease Management support, education, and care coordination needs related to Hypertension, Hyperlipidemia, Coronary Artery Disease, and insomnia and glaucoma   Hypertension BP Readings from Last 3 Encounters:  05/25/20 111/74  05/06/20 100/64  03/05/20 130/65   . Pharmacist Clinical Goal(s): o Over the next 180 days, patient will work with PharmD and providers to maintain BP goal <130/80 . Current regimen:  . Hydrochlorothiazide 25 mg 1 tablet daily . Valsartan 320 mg 1/2 tablet daily . Interventions: o Discussed DASH eating plan recommendations: . Emphasizes vegetables, fruits, and whole-grains . Includes fat-free or low-fat dairy products, fish, poultry, beans, nuts, and vegetable oils . Limits foods that are high in saturated fat. These foods include fatty meats, full-fat dairy products, and tropical oils such as coconut, palm kernel, and palm oils. . Limits sugar-sweetened beverages and sweets . Limiting sodium intake to < 1500 mg/day o Discussed recommendations for moderate aerobic exercise for 150 minutes/week spread out over 5 days for heart healthy lifestyle . Patient self care activities - Over the next 90 days, patient will: o Check blood pressure  weekly, document, and provide at future appointments o Ensure daily salt intake < 2300 mg/day  Hyperlipidemia Lab Results  Component Value Date/Time   LDLCALC 98 05/06/2020 08:42 AM   LDLDIRECT 129.0 08/19/2016 08:38 AM   . Pharmacist Clinical Goal(s): o Over the next 180 days, patient will work with PharmD and providers to achieve LDL goal < 70 . Current regimen:  . Repatha 140 mg inject into the skin every 14 days   . Interventions: o Discussed lowering cholesterol through diet by: [name] Limiting foods with cholesterol such as liver and other organ meats, egg yolks, shrimp, and whole milk dairy products . Avoiding saturated fats and trans fats and incorporating healthier fats, such as lean meat, nuts, and unsaturated oils like canola and olive oils . Eating foods with soluble fiber such as whole-grain cereals such as oatmeal and oat bran, fruits such as apples, bananas, oranges, pears, and prunes, legumes such as kidney beans, lentils, chick peas, black-eyed peas, and lima beans, and green leafy vegetables . Limiting alcohol intake . Patient self care activities - Over the next 180 days, patient will: o Once finished with Repatha, patient will switch to  Praluent o Follow up with cardiology for repeat lab work  Coronary artery disease . Pharmacist Clinical Goal(s): o Over the next 180 days, patient will work with PharmD and providers to prevent heart events . Current regimen:  . Clopidogrel 75 mg 1 tablet daily . Aspirin 81 mg 1 tablet until the end of November  . Nitroglycerin 0.4 mg SL tablet use as needed . Interventions: o Discussed Monitoring for signs of bleeding such as unexplained and excessive bleeding from a cut or injury, easy or excessive bruising, blood in urine or stools, and nosebleeds without a known cause o Verify the expiration date of nitroglycerin . Patient self care activities - Over the next 180 days, patient will: o Continue current medications o Contact  provider with any episodes of bleeding  Insomnia . Pharmacist Clinical Goal(s) o Over the next 180 days, patient will work with PharmD and providers to improve sleep . Current regimen:  o Trazodone 50 mg 1 tablet at bedtime  . Interventions: o Discussed practicing good sleep hygiene by setting a sleep schedule and maintaining it, avoid excessive napping, following a nightly routine, avoiding screen time for 30-60 minutes before going to bed, and making the bedroom a cool, quiet and dark space . Patient self care activities - Over the next 180 days, patient will: o Continue current medication  Glaucoma . Pharmacist Clinical Goal(s) o Over the next 180 days, patient will work with PharmD and providers to reduce eye pressure . Current regimen:  . Simbrinza 1-0.2% 1 drop in both eyes twice daily . Lumigan 0.03% 1 drop in both eyes at bedtime . Patient self care activities - Over the next 180 days, patient will: o Continue current medications  Medication management . Pharmacist Clinical Goal(s): o Over the next 180 days, patient will work with PharmD and providers to maintain optimal medication adherence . Current pharmacy: [facility] . Interventions o Comprehensive medication review performed. o Continue current medication management strategy . Patient self care activities - Over the next 180 days, patient will: o Take medications as prescribed o Report any questions or concerns to PharmD and/or provider(s)  Initial goal documentation        Hypertension   BP goal is:  <130/80  Office blood pressures are  BP Readings from Last 3 Encounters:  05/25/20 111/74  05/06/20 100/64  03/05/20 130/65   Patient checks BP at home 1-2x per week Patient home BP readings are ranging: 121/74  Patient is currently controlled on the following medications:  . Hydrochlorothiazide 25 mg 1 tablet daily . Valsartan 320 mg 1/2 tablet daily  We discussed diet and exercise extensively    -DASH eating plan recommendations: . Emphasizes vegetables, fruits, and whole-grains . Includes fat-free or low-fat dairy products, fish, poultry, beans, nuts, and vegetable oils . Limits foods that are high in saturated fat. These foods include fatty meats, full-fat dairy products, and tropical oils such as coconut, palm kernel, and palm oils. . Limits sugar-sweetened beverages and sweets . Limiting sodium intake to < 1500 mg/day -Discussed recommendations for moderate aerobic exercise for 150 minutes/week spread out over 5 days for heart healthy lifestyle -Diet: patient doesn't use much salt and does not eat frozen or canned foods   Plan Dizziness/lightheadedness every once in a while. Denies chest pain and headaches. Continue current medications     Hyperlipidemia   LDL goal < 70  Lipid Panel     Component Value Date/Time   CHOL 176 05/06/2020 0842  CHOL 242 (H) 03/25/2019 1011   TRIG 178 (H) 05/06/2020 0842   HDL 51 05/06/2020 0842   HDL 37 (L) 03/25/2019 1011   LDLCALC 98 05/06/2020 0842   LDLDIRECT 129.0 08/19/2016 0838    Hepatic Function Latest Ref Rng & Units 05/06/2020 02/24/2020 05/06/2019  Total Protein 6.1 - 8.1 g/dL 7.3 7.3 6.3  Albumin 3.5 - 5.0 g/dL - 4.3 4.1  AST 10 - 35 U/L 21 28 20   ALT 9 - 46 U/L 17 22 16   Alk Phosphatase 38 - 126 U/L - 86 100  Total Bilirubin 0.2 - 1.2 mg/dL 0.5 0.7 0.6  Bilirubin, Direct 0.0 - 0.2 mg/dL 0.1 - 0.1     The 10-year ASCVD risk score Mikey Bussing DC Jr., et al., 2013) is: 35.6%   Values used to calculate the score:     Age: 76 years     Sex: Male     Is Non-Hispanic African American: No     Diabetic: Yes     Tobacco smoker: No     Systolic Blood Pressure: 161 mmHg     Is BP treated: Yes     HDL Cholesterol: 51 mg/dL     Total Cholesterol: 176 mg/dL   [facility] and LDL was 62 and triglycerides were 134. Plans on repeating labs in January for cardiology and unsure of accuracy of  results.   Patient has failed these meds in past: statins (muscle pain), Zetia  Patient is currently uncontrolled on the following medications:  . Repatha 140 mg inject into the skin every 14 days    We discussed:  diet and exercise extensively  -Patient will be switching to Praluent soon due to [place] - gets the medication for free -Lowering cholesterol through diet by: [name] Limiting foods with cholesterol such as liver and other organ meats, egg yolks, shrimp, and whole milk dairy products . Avoiding saturated fats and trans fats and incorporating healthier fats, such as lean meat, nuts, and unsaturated oils like canola and olive oils . Eating foods with soluble fiber such as whole-grain cereals such as oatmeal and oat bran, fruits such as apples, bananas, oranges, pears, and prunes, legumes such as kidney beans, lentils, chick peas, black-eyed peas, and lima beans, and green leafy vegetables . Limiting alcohol intake   Plan  Continue current medications  CAD/Guzman PCI   Patient has failed these meds in past: none Patient is currently controlled on the following medications:  . Clopidogrel 75 mg 1 tablet daily . Aspirin 81 mg 1 tablet until the end of November  . Nitroglycerin 0.4 mg SL tablet used PRN   We discussed:  Monitoring for signs of bleeding such as unexplained and excessive bleeding from a cut or injury, easy or excessive bruising, blood in urine or stools, and nosebleeds without a known cause; verifying expiration date of nitroglycerin   Plan Continue current medications   Insomnia   Patient has failed these meds in past: none Patient is currently improved on the following medications:  . Trazodone 50 mg 1 tablet at bedtime every night  We discussed:  Patient reports full tablet is helping; consistent administration 30 minutes to 1 hour prior to going to bed for best results; Practicing good sleep hygiene by setting a sleep schedule and maintaining it, avoid  excessive napping, following a nightly routine, avoiding screen time for 30-60 minutes before going to bed, and making the bedroom a cool, quiet and dark  space  Plan  Continue current medications   Glaucoma   Patient has failed these meds in past: none Patient is currently controlled on the following medications:  . Simbrinza 1-0.2% 1 drop in both eyes twice daily . Lumigan 0.03% 1 drop in both eyes at bedtime  We discussed:  Patient is getting both eye drops from the [place] at no cost  Plan  Continue current medications   Vaccines   Reviewed and discussed patient's vaccination history.    Immunization History  Administered Date(s) Administered  . Fluad Quad(high Dose 65+) 05/06/2019, 05/06/2020  . Influenza Split 05/15/2012  . Influenza Whole 06/15/2010  . Influenza, High Dose Seasonal PF 04/15/2016, 04/18/2017, 05/02/2018  . Influenza,inj,Quad PF,6+ Mos 08/02/2013, 05/05/2014  . Influenza,inj,quad, With Preservative 05/16/2019  . Influenza-Unspecified 07/30/2015  . PFIZER SARS-COV-2 Vaccination 08/30/2019, 09/20/2019  . Pneumococcal Conjugate-13 09/27/2013  . Pneumococcal Polysaccharide-23 09/15/2008, 04/16/2015  . Td 08/16/2003  . Tdap 05/05/2014  . Zoster 06/17/2011  . Zoster Recombinat (Shingrix) 10/14/2018, 10/14/2018, 03/20/2019, 03/20/2019    Plan  Patient is up to date on all immunizations.  Medication Management   Pt uses [facility] for all medications Uses pill box? No - spice rack in his cabinet Pt endorses 100% compliance   We discussed: Current pharmacy is preferred with insurance plan and patient is satisfied with pharmacy services  Plan  Continue current medication management strategy   Follow up: 6 month phone visit   Jeni Salles, PharmD Clinical Pharmacist [facility] [phone]

## 2020-06-12 NOTE — Patient Instructions (Addendum)
Hi Luis Guzman,   It was great to get to meet you over the phone! As I had mentioned, continue taking good care of yourself by following a heart health lifestyle and taking your medications as prescribed. Please continue checking your blood pressure as you have been and continue exercising.  Please give me a call if you have any questions or need anything before our next follow up!  Best, Maddie  Jeni Salles, PharmD Clinical Pharmacist [facility]   Visit Information  Goals Addressed            This Visit's Progress   . Pharmacy Care Plan       CARE PLAN ENTRY (see longitudinal plan of care for additional care plan information)  Current Barriers:  . Chronic Disease Management support, education, and care coordination needs related to Hypertension, Hyperlipidemia, Coronary Artery Disease, and insomnia and glaucoma   Hypertension BP Readings from Last 3 Encounters:  05/25/20 111/74  05/06/20 100/64  03/05/20 130/65   . Pharmacist Clinical Goal(s): o Over the next 180 days, patient will work with PharmD and providers to maintain BP goal <130/80 . Current regimen:  . Hydrochlorothiazide 25 mg 1 tablet daily . Valsartan 320 mg 1/2 tablet daily . Interventions: o Discussed DASH eating plan recommendations: . Emphasizes vegetables, fruits, and whole-grains . Includes fat-free or low-fat dairy products, fish, poultry, beans, nuts, and vegetable oils . Limits foods that are high in saturated fat. These foods include fatty meats, full-fat dairy products, and tropical oils such as coconut, palm kernel, and palm oils. . Limits sugar-sweetened beverages and sweets . Limiting sodium intake to < 1500 mg/day o Discussed recommendations for moderate aerobic exercise for 150 minutes/week spread out over 5 days for heart healthy lifestyle . Patient self care activities - Over the next 90 days, patient will: o Check blood pressure weekly, document, and  provide at future appointments o Ensure daily salt intake < 2300 mg/day  Hyperlipidemia Lab Results  Component Value Date/Time   LDLCALC 98 05/06/2020 08:42 AM   LDLDIRECT 129.0 08/19/2016 08:38 AM   . Pharmacist Clinical Goal(s): o Over the next 180 days, patient will work with PharmD and providers to achieve LDL goal < 70 . Current regimen:  . Repatha 140 mg inject into the skin every 14 days   . Interventions: o Discussed lowering cholesterol through diet by: [name] Limiting foods with cholesterol such as liver and other organ meats, egg yolks, shrimp, and whole milk dairy products . Avoiding saturated fats and trans fats and incorporating healthier fats, such as lean meat, nuts, and unsaturated oils like canola and olive oils . Eating foods with soluble fiber such as whole-grain cereals such as oatmeal and oat bran, fruits such as apples, bananas, oranges, pears, and prunes, legumes such as kidney beans, lentils, chick peas, black-eyed peas, and lima beans, and green leafy vegetables . Limiting alcohol intake . Patient self care activities - Over the next 180 days, patient will: o Once finished with Repatha, patient will switch to Praluent o Follow up with cardiology for repeat lab work  Coronary artery disease . Pharmacist Clinical Goal(s): o Over the next 180 days, patient will work with PharmD and providers to prevent heart events . Current regimen:  . Clopidogrel 75 mg 1 tablet daily . Aspirin 81 mg 1 tablet until the end of November  . Nitroglycerin 0.4 mg SL tablet use as needed . Interventions: o Discussed Monitoring for signs of bleeding such as unexplained  and excessive bleeding from a cut or injury, easy or excessive bruising, blood in urine or stools, and nosebleeds without a known cause o Verify the expiration date of nitroglycerin . Patient self care activities - Over the next 180 days, patient will: o Continue current medications o Contact provider with any episodes of  bleeding  Insomnia . Pharmacist Clinical Goal(s) o Over the next 180 days, patient will work with PharmD and providers to improve sleep . Current regimen:  o Trazodone 50 mg 1 tablet at bedtime  . Interventions: o Discussed practicing good sleep hygiene by setting a sleep schedule and maintaining it, avoid excessive napping, following a nightly routine, avoiding screen time for 30-60 minutes before going to bed, and making the bedroom a cool, quiet and dark space . Patient self care activities - Over the next 180 days, patient will: o Continue current medication  Glaucoma . Pharmacist Clinical Goal(s) o Over the next 180 days, patient will work with PharmD and providers to reduce eye pressure . Current regimen:  . Simbrinza 1-0.2% 1 drop in both eyes twice daily . Lumigan 0.03% 1 drop in both eyes at bedtime . Patient self care activities - Over the next 180 days, patient will: o Continue current medications  Medication management . Pharmacist Clinical Goal(s): o Over the next 180 days, patient will work with PharmD and providers to maintain optimal medication adherence . Current pharmacy: [facility] . Interventions o Comprehensive medication review performed. o Continue current medication management strategy . Patient self care activities - Over the next 180 days, patient will: o Take medications as prescribed o Report any questions or concerns to PharmD and/or provider(s)  Initial goal documentation        Luis Guzman was given information about Chronic Care Management services today including:  1. CCM service includes personalized support from designated clinical staff supervised by his physician, including individualized plan of care and coordination with other care providers 2. 24/7 contact phone numbers for assistance for urgent and routine care needs. 3. Standard insurance, coinsurance, copays and deductibles apply for chronic care management only during months in which  we provide at least 20 minutes of these services. Most insurances cover these services at 100%, however patients may be responsible for any copay, coinsurance and/or deductible if applicable. This service may help you avoid the need for more expensive face-to-face services. 4. Only one practitioner may furnish and bill the service in a calendar month. 5. The patient may stop CCM services at any time (effective at the end of the month) by phone call to the office staff.  Patient agreed to services and verbal consent obtained.   The patient verbalized understanding of instructions provided today and agreed to receive a mailed copy of patient instruction and/or educational materials. Telephone follow up appointment with pharmacy team member scheduled for: 6 month

## 2020-06-13 ENCOUNTER — Ambulatory Visit: Payer: Medicare Other | Attending: Internal Medicine

## 2020-06-13 DIAGNOSIS — Z23 Encounter for immunization: Secondary | ICD-10-CM

## 2020-06-13 NOTE — Progress Notes (Signed)
   Covid-19 Vaccination Clinic  Name:  Luis Guzman    MRN: [id number] DOB: 03-24-1946  06/13/2020  Luis Guzman was observed post Covid-19 immunization for 15 minutes without incident. He was provided with Vaccine Information Sheet and instruction to access the V-Safe system.   Luis Guzman was instructed to call 911 with any severe reactions post vaccine: [name] Difficulty breathing  . Swelling of face and throat  . A fast heartbeat  . A bad rash all over body  . Dizziness and weakness

## 2020-07-16 DIAGNOSIS — M1611 Unilateral primary osteoarthritis, right hip: Secondary | ICD-10-CM | POA: Diagnosis not present

## 2020-07-16 DIAGNOSIS — Z96642 Presence of left artificial hip joint: Secondary | ICD-10-CM | POA: Diagnosis not present

## 2020-07-23 ENCOUNTER — Telehealth: Payer: Self-pay

## 2020-07-23 NOTE — Telephone Encounter (Signed)
   [facility] Pre-operative Risk Assessment    Request for surgical clearance:  1. What type of surgery is being performed? TOTAL HIP ARTHROPLASTY ANTERIOR APPROACH   2. When is this surgery scheduled? 09-16-2020   3. What type of clearance is required (medical clearance vs. Pharmacy clearance to hold med vs. Both)? BOTH  4. Are there any medications that need to be held prior to surgery and how long? PLAVIX   5. Practice name and name of physician performing surgery? [facility]   6. What is the office phone number? [phone]   7.   What is the office fax number? [phone]  8.   Anesthesia type (None, local, MAC, general) ? CHOICE

## 2020-07-24 NOTE — Telephone Encounter (Signed)
   Primary Cardiologist: Glenetta Hew, MD  Chart reviewed as part of pre-operative protocol coverage. Patient was recently seen by Dr. Ellyn Hack on 05/25/2020 at which time upcoming hip surgery was discussed.   Per Dr. Allison Quarry note: "Similar to his previous visit prior to left hip arthroplasty, he was doing well at that time was able to do more than 4 METS, and now is easily up to the 6-8 METS level. I suspect if once we can get him fully "operational "chronic comes to exercise, the fatigue will improve.  He does not require any further cardiac evaluation at this time.  Echocardiogram looked good.  No anginal symptoms since last PCI.  Hip surgery will be considered intermediate risk.  He would therefore probably be considered to be class I-II risk ranging from 3 to 6%.  No further current evaluation will be needed if he is to undergo right hip arthroplasty within the next 6 months.  After that point would be to be reevaluated but probably would not need any further testing."  Per Dr. Ellyn Hack, [place] to hold Plavix for 5-7 days prior to procedure. However, Dr. Ellyn Hack would prefer him to take Aspirin 81mg  while off Plavix if surgeon is agreeable. Plavix should be restarted as soon as possible postoperatively.   I will route this recommendation to the requesting party via Epic fax function and remove from pre-op pool.  Please call with questions.  [name], PA-C 07/24/2020, 8:30 AM

## 2020-08-26 LAB — COMPREHENSIVE METABOLIC PANEL WITH GFR
ALT: 16 IU/L (ref 0–44)
AST: 18 IU/L (ref 0–40)
Albumin/Globulin Ratio: 2.1 (ref 1.2–2.2)
Albumin: 4.6 g/dL (ref 3.7–4.7)
Alkaline Phosphatase: 113 IU/L (ref 44–121)
BUN/Creatinine Ratio: 13 (ref 10–24)
BUN: 16 mg/dL (ref 8–27)
Bilirubin Total: 0.4 mg/dL (ref 0.0–1.2)
CO2: 24 mmol/L (ref 20–29)
Calcium: 9.9 mg/dL (ref 8.6–10.2)
Chloride: 103 mmol/L (ref 96–106)
Creatinine, Ser: 1.28 mg/dL — ABNORMAL HIGH (ref 0.76–1.27)
GFR calc Af Amer: 63 mL/min/1.73
GFR calc non Af Amer: 55 mL/min/1.73 — ABNORMAL LOW
Globulin, Total: 2.2 g/dL (ref 1.5–4.5)
Glucose: 108 mg/dL — ABNORMAL HIGH (ref 65–99)
Potassium: 4.6 mmol/L (ref 3.5–5.2)
Sodium: 141 mmol/L (ref 134–144)
Total Protein: 6.8 g/dL (ref 6.0–8.5)

## 2020-08-26 LAB — LIPID PANEL
Chol/HDL Ratio: 4.2 ratio (ref 0.0–5.0)
Cholesterol, Total: 164 mg/dL (ref 100–199)
HDL: 39 mg/dL — ABNORMAL LOW (ref >39)
LDL Chol Calc (NIH): 89 mg/dL (ref 0–99)
Triglycerides: 215 mg/dL — ABNORMAL HIGH (ref 0–149)
VLDL Cholesterol Cal: 36 mg/dL (ref 5–40)

## 2020-08-27 ENCOUNTER — Telehealth: Payer: Self-pay | Admitting: Cardiology

## 2020-08-27 DIAGNOSIS — Z79899 Other long term (current) drug therapy: Secondary | ICD-10-CM

## 2020-08-27 DIAGNOSIS — R7989 Other specified abnormal findings of blood chemistry: Secondary | ICD-10-CM

## 2020-08-27 NOTE — Telephone Encounter (Signed)
Patient had blood work done for Dr. Ellyn Hack on 08/25/20 and was told to hold his HCTZ 2-3 days and then check his BMP mid next week. He wants to know when he should hold the HCTZ, hold it now or hold it closer to next week before he gets his blood checked? Also there is no order for a BMP. He also states that in Mychart on his lab results Dr. Ellyn Hack talked about Repatha not working as well as it should. He states he is not on Repatha but is on Praluent. He started getting this last year when the [place] wouldn't approve Repatha but recommended Praluent. The praluent was not meeting his [place] doctors standards and was recently increased from 75mg  to 150mg  and he got his first dose of this on 08/24/20, the day before he had his blood work done for Dr. Ellyn Hack.

## 2020-08-28 ENCOUNTER — Other Ambulatory Visit: Payer: Self-pay | Admitting: *Deleted

## 2020-08-28 DIAGNOSIS — Z79899 Other long term (current) drug therapy: Secondary | ICD-10-CM

## 2020-08-28 DIAGNOSIS — R7989 Other specified abnormal findings of blood chemistry: Secondary | ICD-10-CM

## 2020-08-28 NOTE — Telephone Encounter (Signed)
Spoke to patient. Reviewed  Last several offic enotes to see when patient restarted Hctz. Patient is aware to  Hold HCTZ  25 mg starting tomorrow for 2-3 days  And rechecking  Thursday or Friday of next week . BMP  Has been ordered.  [facility] states [facility] several of [facility]  Due to not on [place] formulary  -- Valsartan changed to Losartan 100 mg daily -- Repatha changed to Praulent 75 mg - recent increased 08/24/20 to 150 mg every 2 weeks-- --- restarted  Potassium 20 meq ( 2 tablets of 10 meq total)    RN updated medication list.  Patient voiced understanding

## 2020-08-28 NOTE — Addendum Note (Signed)
Addended by: Raiford Simmonds on: 08/28/2020 04:44 PM   Modules accepted: Orders

## 2020-09-02 DIAGNOSIS — R7989 Other specified abnormal findings of blood chemistry: Secondary | ICD-10-CM | POA: Diagnosis not present

## 2020-09-02 DIAGNOSIS — Z79899 Other long term (current) drug therapy: Secondary | ICD-10-CM | POA: Diagnosis not present

## 2020-09-03 LAB — BASIC METABOLIC PANEL
BUN/Creatinine Ratio: 10 (ref 10–24)
BUN: 11 mg/dL (ref 8–27)
CO2: 21 mmol/L (ref 20–29)
Calcium: 9.3 mg/dL (ref 8.6–10.2)
Chloride: 109 mmol/L — ABNORMAL HIGH (ref 96–106)
Creatinine, Ser: 1.09 mg/dL (ref 0.76–1.27)
GFR calc Af Amer: 77 mL/min/{1.73_m2} (ref >59)
GFR calc non Af Amer: 67 mL/min/{1.73_m2} (ref >59)
Glucose: 90 mg/dL (ref 65–99)
Potassium: 4.7 mmol/L (ref 3.5–5.2)
Sodium: 144 mmol/L (ref 134–144)

## 2020-09-03 NOTE — Progress Notes (Signed)
DUE TO COVID-19 ONLY ONE VISITOR IS ALLOWED TO COME WITH YOU AND STAY IN THE WAITING ROOM ONLY DURING PRE OP AND PROCEDURE DAY OF SURGERY. THE 1 VISITOR  MAY VISIT WITH YOU AFTER SURGERY IN YOUR PRIVATE ROOM DURING VISITING HOURS ONLY!  YOU NEED TO HAVE A COVID 19 TEST ON__1/29/2022 _____ @_______ , THIS TEST MUST BE DONE BEFORE SURGERY,  COVID TESTING SITE [address] [place] [address], IT IS ON THE RIGHT GOING OUT [address] APPROXIMATELY  2 MINUTES PAST [employer] ON THE RIGHT. ONCE YOUR COVID TEST IS COMPLETED,  PLEASE BEGIN THE QUARANTINE INSTRUCTIONS AS OUTLINED IN YOUR HANDOUT.                Luis Guzman  09/03/2020   Your procedure is scheduled on: 09/16/2020    Report to [facility] Main  Entrance   Report to admitting at    140pm     Call this number if you have problems the morning of surgery [phone]    REMEMBER: NO  SOLID FOOD CANDY OR GUM AFTER MIDNIGHT. CLEAR LIQUIDS UNTIL         . NOTHING BY MOUTH EXCEPT CLEAR LIQUIDS UNTIL    110pm   . PLEASE FINISH ENSURE DRINK PER SURGEON ORDER  WHICH NEEDS TO BE COMPLETED AT   110pm  .      CLEAR LIQUID DIET   Foods Allowed                                                                    Coffee and tea, regular and decaf                            Fruit ices (not with fruit pulp)                                      Iced Popsicles                                    Carbonated beverages, regular and diet                                    Cranberry, grape and apple juices Sports drinks like Gatorade Lightly seasoned clear broth or consume(fat free) Sugar, honey syrup ___________________________________________________________________      BRUSH YOUR TEETH MORNING OF SURGERY AND RINSE YOUR MOUTH OUT, NO CHEWING GUM CANDY OR MINTS.     Take these medicines the morning of surgery with A SIP OF WATER: Eye drops as usual   DO NOT TAKE ANY DIABETIC MEDICATIONS DAY OF YOUR SURGERY                                You may not have any metal on your body including hair pins and              piercings  Do not wear jewelry, make-up, lotions, powders or perfumes, deodorant             Do not wear nail polish on your fingernails.  Do not shave  48 hours prior to surgery.              Men may shave face and neck.   Do not bring valuables to the hospital. [place].  Contacts, dentures or bridgework may not be worn into surgery.  Leave suitcase in the car. After surgery it may be brought to your room.     Patients discharged the day of surgery will not be allowed to drive home. IF YOU ARE HAVING SURGERY AND GOING HOME THE SAME DAY, YOU MUST HAVE AN ADULT TO DRIVE YOU HOME AND BE WITH YOU FOR 24 HOURS. YOU MAY GO HOME BY TAXI OR UBER OR ORTHERWISE, BUT AN ADULT MUST ACCOMPANY YOU HOME AND STAY WITH YOU FOR 24 HOURS.  Name and phone number of your driver:  Special Instructions: N/A              Please read over the following fact sheets you were given: _____________________________________________________________________  [facility] - Preparing for Surgery Before surgery, you can play an important role.  Because skin is not sterile, your skin needs to be as free of germs as possible.  You can reduce the number of germs on your skin by washing with CHG (chlorahexidine gluconate) soap before surgery.  CHG is an antiseptic cleaner which kills germs and bonds with the skin to continue killing germs even after washing. Please DO NOT use if you have an allergy to CHG or antibacterial soaps.  If your skin becomes reddened/irritated stop using the CHG and inform your nurse when you arrive at Short Stay. Do not shave (including legs and underarms) for at least 48 hours prior to the first CHG shower.  You may shave your face/neck. Please follow these instructions carefully:  1.  Shower with CHG Soap the night before surgery and the  morning of  Surgery.  2.  If you choose to wash your hair, wash your hair first as usual with your  normal  shampoo.  3.  After you shampoo, rinse your hair and body thoroughly to remove the  shampoo.                           4.  Use CHG as you would any other liquid soap.  You can apply chg directly  to the skin and wash                       Gently with a scrungie or clean washcloth.  5.  Apply the CHG Soap to your body ONLY FROM THE NECK DOWN.   Do not use on face/ open                           Wound or open sores. Avoid contact with eyes, ears mouth and genitals (private parts).                       Wash face,  Genitals (private parts) with your normal soap.             6.  Wash  thoroughly, paying special attention to the area where your surgery  will be performed.  7.  Thoroughly rinse your body with warm water from the neck down.  8.  DO NOT shower/wash with your normal soap after using and rinsing off  the CHG Soap.                9.  Pat yourself dry with a clean towel.            10.  Wear clean pajamas.            11.  Place clean sheets on your bed the night of your first shower and do not  sleep with pets. Day of Surgery : Do not apply any lotions/deodorants the morning of surgery.  Please wear clean clothes to the hospital/surgery center.  FAILURE TO FOLLOW THESE INSTRUCTIONS MAY RESULT IN THE CANCELLATION OF YOUR SURGERY PATIENT SIGNATURE_________________________________  NURSE SIGNATURE__________________________________  ________________________________________________________________________

## 2020-09-03 NOTE — Progress Notes (Addendum)
Anesthesia Review:  PCP: Cardiologist : DR Glenetta Hew clearance- 07/23/2020 - Luis Guzman,pac Chest x-ray : EKG :02/13/2020  Echo : 02/2020  Stress test: Cardiac Cath : 2020 Activity level: can do a flight of stairs without difficulty  Sleep Study/ CPAP : no  Fasting Blood Sugar :      / Checks Blood Sugar -- times a day:   Blood Thinner/ Instructions /Last Dose: ASA / Instructions/ Last Dose :  06/2019- STENTS  pLAVIX- PER NOTE dr Ellyn Hack WOULD LIKE FOR PT TO BE ON 81 MG aspirin while off Plavix  Last dose of plavix on 09/09/19 per pt

## 2020-09-04 NOTE — Telephone Encounter (Signed)
With improved kidney function having stopped HCTZ, the plan would be to continue off of HCTZ for now, and no need to take potassium.  Would like for him to keep a blood pressure log of intermittent blood pressure checks.  We can reassess how to address his BP control depending on what his monitor shows.  Glenetta Hew, MD

## 2020-09-07 ENCOUNTER — Encounter (HOSPITAL_COMMUNITY)
Admission: RE | Admit: 2020-09-07 | Discharge: 2020-09-07 | Disposition: A | Discharge status: Home / Self Care | Payer: Medicare Other | Source: Ambulatory Visit | Attending: Orthopedic Surgery | Admitting: Orthopedic Surgery

## 2020-09-07 ENCOUNTER — Other Ambulatory Visit: Payer: Self-pay

## 2020-09-07 ENCOUNTER — Encounter (HOSPITAL_COMMUNITY): Payer: Self-pay

## 2020-09-07 DIAGNOSIS — Z87891 Personal history of nicotine dependence: Secondary | ICD-10-CM | POA: Insufficient documentation

## 2020-09-07 DIAGNOSIS — Z7901 Long term (current) use of anticoagulants: Secondary | ICD-10-CM | POA: Diagnosis not present

## 2020-09-07 DIAGNOSIS — M1611 Unilateral primary osteoarthritis, right hip: Secondary | ICD-10-CM | POA: Insufficient documentation

## 2020-09-07 DIAGNOSIS — Z01812 Encounter for preprocedural laboratory examination: Secondary | ICD-10-CM | POA: Insufficient documentation

## 2020-09-07 DIAGNOSIS — Z7902 Long term (current) use of antithrombotics/antiplatelets: Secondary | ICD-10-CM | POA: Diagnosis not present

## 2020-09-07 DIAGNOSIS — Z79899 Other long term (current) drug therapy: Secondary | ICD-10-CM | POA: Diagnosis not present

## 2020-09-07 DIAGNOSIS — I2510 Atherosclerotic heart disease of native coronary artery without angina pectoris: Secondary | ICD-10-CM | POA: Diagnosis not present

## 2020-09-07 DIAGNOSIS — I10 Essential (primary) hypertension: Secondary | ICD-10-CM | POA: Insufficient documentation

## 2020-09-07 LAB — APTT: aPTT: 33 seconds (ref 24–36)

## 2020-09-07 LAB — COMPREHENSIVE METABOLIC PANEL
ALT: 21 U/L (ref 0–44)
AST: 25 U/L (ref 15–41)
Albumin: 4.1 g/dL (ref 3.5–5.0)
Alkaline Phosphatase: 75 U/L (ref 38–126)
Anion gap: 11 (ref 5–15)
BUN: 12 mg/dL (ref 8–23)
CO2: 22 mmol/L (ref 22–32)
Calcium: 9.4 mg/dL (ref 8.9–10.3)
Chloride: 108 mmol/L (ref 98–111)
Creatinine, Ser: 0.95 mg/dL (ref 0.61–1.24)
GFR, Estimated: >60 mL/min (ref >60)
Glucose, Bld: 136 mg/dL — ABNORMAL HIGH (ref 70–99)
Potassium: 4.1 mmol/L (ref 3.5–5.1)
Sodium: 141 mmol/L (ref 135–145)
Total Bilirubin: 0.6 mg/dL (ref 0.3–1.2)
Total Protein: 6.7 g/dL (ref 6.5–8.1)

## 2020-09-07 LAB — CBC
HCT: 46.8 % (ref 39.0–52.0)
Hemoglobin: 15.8 g/dL (ref 13.0–17.0)
MCH: 31.5 pg (ref 26.0–34.0)
MCHC: 33.8 g/dL (ref 30.0–36.0)
MCV: 93.4 fL (ref 80.0–100.0)
Platelets: 221 10*3/uL (ref 150–400)
RBC: 5.01 MIL/uL (ref 4.22–5.81)
RDW: 12.9 % (ref 11.5–15.5)
WBC: 8.3 10*3/uL (ref 4.0–10.5)
nRBC: 0 % (ref 0.0–0.2)

## 2020-09-07 LAB — PROTIME-INR
INR: 1 (ref 0.8–1.2)
Prothrombin Time: 13.2 seconds (ref 11.4–15.2)

## 2020-09-07 LAB — SURGICAL PCR SCREEN
MRSA, PCR: NEGATIVE
Staphylococcus aureus: NEGATIVE

## 2020-09-07 NOTE — Progress Notes (Signed)
Anesthesia Chart Review   Case: [id number] Date/Time: 09/16/20 1555   Procedure: TOTAL HIP ARTHROPLASTY ANTERIOR APPROACH (Right Hip) - 136min   Anesthesia type: Choice   Pre-op diagnosis: right hip osteoarthritis   Location: [facility] 09 / [facility]   Surgeons: Gaynelle Arabian, MD      DISCUSSION:74 y.o. former smoker with h/o GERD, HTN, CAD, AAA (Ectatic aorta measuring 2.7 cm in maximal diameter on [place] 01/2016, followed by Dr. Ellyn Hack with repeat imaging planned for later this year), DM II, right hip OA scheduled for above procedure 09/16/20 with Dr. Gaynelle Arabian.   Per cardiology preoperative risk assessment 07/24/2020, "Chart reviewed as part of pre-operative protocol coverage. Patient was recently seen by Dr. Ellyn Hack on 05/25/2020 at which time upcoming hip surgery was discussed.   Per Dr. Allison Quarry note: "Similar to his previous visit prior to left hip arthroplasty, he was doing well at that time was able to do more than 4 METS, and now is easily up to the 6-8 METS level. I suspect if once we can get him fully "operational "chronic comes to exercise, the fatigue will improve. He does not require any further cardiac evaluation at this time. Echocardiogram looked good. No anginal symptoms since last PCI. Hip surgery will be considered intermediate risk. He would therefore probably be considered to be class I-II risk ranging from 3 to 6%. No further current evaluation will be needed if he is to undergo right hip arthroplasty within the next 6 months. After that point would be to be reevaluated but probably would not need any further testing."  Per Dr. Ellyn Hack, [place] to hold Plavix for 5-7 days prior to procedure. However, Dr. Ellyn Hack would prefer him to take Aspirin 81mg  while off Plavix if surgeon is agreeable. Plavix should be restarted as soon as possible postoperatively."  Anticipate pt can proceed with planned procedure barring acute status change.   VS: BP 137/66   Pulse 74   Temp 36.8 C  (Oral)   Resp 16   Ht 6' (1.829 m)   Wt 87.6 kg   SpO2 98%   BMI 26.20 kg/m   PROVIDERS: Eulas Post, MD is PCP   Glenetta Hew, MD is Cardiologist  LABS: Labs reviewed: Acceptable for surgery. (all labs ordered are listed, but only abnormal results are displayed)  Labs Reviewed  COMPREHENSIVE METABOLIC PANEL - Abnormal; Notable for the following components:      Result Value   Glucose, Bld 136 (*)    All other components within normal limits  SURGICAL PCR SCREEN  CBC  PROTIME-INR  APTT  TYPE AND SCREEN     IMAGES:   EKG: 02/13/2020 Rate 76 bpm  NSR  CV: Echo 02/14/20 IMPRESSIONS    1. Left ventricular ejection fraction, by estimation, is 55 to 60%. The  left ventricle has normal function. The left ventricle has no regional  wall motion abnormalities. Left ventricular diastolic parameters are  consistent with Grade I diastolic  dysfunction (impaired relaxation).  2. Right ventricular systolic function is normal. The right ventricular  size is normal. There is normal pulmonary artery systolic pressure.  3. The mitral valve is normal in structure. Trivial mitral valve  regurgitation. No evidence of mitral stenosis.  4. The aortic valve is tricuspid. Aortic valve regurgitation is mild. No  aortic stenosis is present.  5. The inferior vena cava is normal in size with greater than 50%  respiratory variability, suggesting right atrial pressure of 3 mmHg.  Past Medical History:  Diagnosis Date  . AAA (abdominal aortic aneurysm) ([place])   . Anemia    as infant history of  . CAD S/P PTCA & DES PCI - for Progressive Angina 02/26/2019   Cath-PCI 02/26/2019: EF 55-65%. mCx 65% (FFR 0.82 - Med Rx). D2 85% - Wolverine Scoring Balloon PTCA (2.0 mm). Ost rPDA - 90% -Resolute Onyx 2.5 x 15 (2.6 mm). --> 06/2019: staged DES PCI ost D1 (RESOLUTE ONYX 2.25 mm x 50 mm (2.6 mm) positioned to avoid the true ostium)  . Elevated PSA   . GERD (gastroesophageal reflux  disease)   . History of diabetes mellitus, type II    loss weight under control currently  . History of hiatal hernia    40 yrs ago  . History of pancreatitis    elevated lipase levels   . HYPERLIPIDEMIA 01/26/2009  . HYPERTENSION 01/26/2009  . 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 lower back pain, s/p cervical neck surgery"stiffness" remains  . Transfusion history    infant "anemia"    Past Surgical History:  Procedure Laterality Date  . APPENDECTOMY  ~ 1959  . BACK SURGERY    . CERVICAL DISC ARTHROPLASTY N/A 07/30/2018   Procedure: Cervical five-six Cervical six-seven Artificial disc replacement;  Surgeon: Kristeen Miss, MD;  Location: [place];  Service: Neurosurgery;  Laterality: N/A;  . CERVICAL LAMINECTOMY  1997   C 4 and C 5  . CHOLECYSTECTOMY N/A 04/15/2014   Procedure: LAPAROSCOPIC CHOLECYSTECTOMY WITH INTRAOPERATIVE CHOLANGIOGRAM;  Surgeon: Armandina Gemma, MD;  [facility];  Service: General;  Laterality: N/A;  . COLONOSCOPY    . CORONARY BALLOON ANGIOPLASTY N/A 02/26/2019   Procedure: CORONARY BALLOON ANGIOPLASTY;  Surgeon: Leonie Man, MD;  Location: [place] [facility];  Service: Cardiovascular -> scoring balloon PTCA (Wolverine 2.0 mm) of ostial D2 85% reducing to 20-30%.  . CORONARY STENT INTERVENTION N/A 02/26/2019   Procedure: CORONARY STENT INTERVENTION;  Surgeon: Leonie Man, MD;  Location: [place] [facility];; PCI ostial RPDA (initial attempt of PTCA only led to small local tear/dissection covered with stent) Resolute Onyx 2.5 mm x 15 mm (2.6 mm  . CORONARY STENT INTERVENTION N/A 07/05/2019   Procedure: CORONARY STENT INTERVENTION;  Surgeon: Leonie Man, MD;  Location: [place] [facility];  Service: Cardiovascular;; ostial D1 95% (restenosis of PTCA site) -> DES PCI RESOLUTE ONYX 2.25 mm x 50 mm (2.6 mm) positioned to avoid the true ostium.   . ESOPHAGOGASTRODUODENOSCOPY N/A 03/13/2014   Procedure: ESOPHAGOGASTRODUODENOSCOPY (EGD);   Surgeon: Wonda Horner, MD;  [facility] END[facility];  Service: Endoscopy;  Laterality: N/A;  . INTRAVASCULAR PRESSURE WIRE/FFR STUDY N/A 02/26/2019   Procedure: INTRAVASCULAR PRESSURE WIRE/FFR STUDY;  Surgeon: Leonie Man, MD;  Location: [place] [facility];  Service: Cardiovascular;; mCx ~65% - DFR 0.92, FR 0.82 - BORDERLINE--> MED Rx  . LEFT HEART CATH AND CORONARY ANGIOGRAPHY N/A 02/26/2019   Procedure: LEFT HEART CATH AND CORONARY ANGIOGRAPHY;  Surgeon: Leonie Man, MD;  Location: [place] [facility];  EF 55-65%. mCx 65% (FFR 0.82 - Med Rx). D2 85% - Wolverine Scoring Balloon PTCA (2.0 mm). Ost rPDA - 90% -Resolute Onyx 2.5 x 15 (2.6 mm).   . LEFT HEART CATH AND CORONARY ANGIOGRAPHY N/A 07/05/2019   Procedure: LEFT HEART CATH AND CORONARY ANGIOGRAPHY;  Surgeon: Leonie Man, MD;  Location: [facility] INVASIVE CV LAB;; ostial D1 95% (restenosis of PTCA site) -> DES PCI.  PDA stent widely patent.  Mid  CX stable 65% lesion.  Proximal RCA 40%.  Mid RCA 45%.  Normal EDP.  [name] 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  . SHOULDER ARTHROSCOPY Bilateral   . SHOULDER ARTHROSCOPY W/ ROTATOR CUFF REPAIR Bilateral 2004-2009   left 2004, right 2009  . TOTAL HIP ARTHROPLASTY Left 03/04/2020   Procedure: TOTAL HIP ARTHROPLASTY ANTERIOR APPROACH;  Surgeon: Gaynelle Arabian, MD;  [facility];  Service: Orthopedics;  Laterality: Left;  162min  . TRANSTHORACIC ECHOCARDIOGRAM  02/2020   EF 55 to 60%.  GR 1 DD.  No R WMA.  Trivial MR.  Otherwise normal.  . VASECTOMY    . WISDOM TOOTH EXTRACTION      MEDICATIONS: . Alirocumab (PRALUENT) 150 MG/ML SOAJ  . bimatoprost (LUMIGAN) 0.03 % ophthalmic solution  . Brinzolamide-Brimonidine (SIMBRINZA) 1-0.2 % SUSP  . clopidogrel (PLAVIX) 75 MG tablet  . losartan (COZAAR) 100 MG tablet  . nitroGLYCERIN (NITROSTAT) 0.4 MG SL tablet  . traZODone (DESYREL) 50 MG tablet   No current facility-administered medications for this encounter.    Konrad Felix, PA-C [facility] [phone]

## 2020-09-07 NOTE — Anesthesia Preprocedure Evaluation (Addendum)
Anesthesia Evaluation  Patient identified by MRN, date of birth, ID band Patient awake    Reviewed: Allergy & Precautions, NPO status , Patient's Chart, lab work & pertinent test results  Airway Mallampati: II  TM Distance: >3 FB     Dental  (+) Dental Advisory Given   Pulmonary former smoker,    breath sounds clear to auscultation       Cardiovascular hypertension, Pt. on medications + CAD   Rhythm:Regular Rate:Normal     Neuro/Psych negative neurological ROS     GI/Hepatic Neg liver ROS, hiatal hernia, GERD  ,  Endo/Other  negative endocrine ROS  Renal/GU negative Renal ROS     Musculoskeletal  (+) Arthritis ,   Abdominal   Peds  Hematology negative hematology ROS (+)   Anesthesia Other Findings   Reproductive/Obstetrics                            Lab Results  Component Value Date   WBC 8.9 09/30/2020   HGB 16.5 09/30/2020   HCT 47.7 09/30/2020   MCV 91.9 09/30/2020   PLT 206 09/30/2020   Lab Results  Component Value Date   CREATININE 0.95 09/07/2020   BUN 12 09/07/2020   NA 141 09/07/2020   K 4.1 09/07/2020   CL 108 09/07/2020   CO2 22 09/07/2020    Anesthesia Physical Anesthesia Plan  ASA: III  Anesthesia Plan: Spinal   Post-op Pain Management:    Induction:   PONV Risk Score and Plan: 1 and Propofol infusion, Ondansetron and Treatment may vary due to age or medical condition  Airway Management Planned: Natural Airway and Simple Face Mask  Additional Equipment:   Intra-op Plan:   Post-operative Plan:   Informed Consent: I have reviewed the patients History and Physical, chart, labs and discussed the procedure including the risks, benefits and alternatives for the proposed anesthesia with the patient or authorized representative who has indicated his/her understanding and acceptance.       Plan Discussed with: CRNA  Anesthesia Plan Comments:         Anesthesia Quick Evaluation

## 2020-09-09 NOTE — H&P (Signed)
TOTAL HIP ADMISSION H&P  Patient is admitted for right total hip arthroplasty.  Subjective:  Chief Complaint: Right hip pain  HPI: Luis Guzman, 75 y.o. male, has a history of pain and functional disability in the right hip due to arthritis and patient has failed non-surgical conservative treatments for greater than 12 weeks to include NSAID's and/or analgesics and activity modification. Onset of symptoms was gradual, starting several years ago with gradually worsening course since that time. The patient noted no past surgery on the right hip. Patient currently rates pain in the right hip at 8 out of 10 with activity. Patient has worsening of pain with activity and weight bearing, pain that interfers with activities of daily living, pain with passive range of motion and crepitus. Patient has evidence of severe end-stage arthritis, bone-on-bone throughout by imaging studies. This condition presents safety issues increasing the risk of falls. There is no current active infection.  Patient Active Problem List   Diagnosis Date Noted  . Primary osteoarthritis of left hip 03/04/2020  . DOE (dyspnea on exertion) 02/13/2020  . Pain in left knee 10/30/2019  . Fatigue due to treatment 07/18/2019  . Coronary artery disease involving native coronary artery of native heart with angina pectoris ([place]) 06/28/2019  . Preop cardiovascular exam 05/31/2019  . Hypokalemia 02/27/2019  . CAD S/P & DES PCI -ostial PDA and D1 02/26/2019  . Angina, class III ([place]) 02/14/2019  . Cervical spondylosis with myelopathy 07/30/2018  . AAA (abdominal aortic aneurysm) without rupture ([place]) 04/15/2016  . Biliary dyskinesia 04/08/2014  . Abdominal pain, epigastric 04/08/2014  . Pancreatitis, acute 03/12/2014  . Pancreatitis 03/12/2014  . Hyperglycemia 06/25/2012  . ACUTE BRONCHITIS 03/11/2010  . 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 SINUSITIS, ACUTE 06/11/2009  . Hyperlipidemia with target LDL less  than 70 - statin intolerant 01/26/2009  . Essential hypertension 01/26/2009    Past Medical History:  Diagnosis Date  . AAA (abdominal aortic aneurysm) ([place])   . Anemia    as infant history of  . CAD S/P PTCA & DES PCI - for Progressive Angina 02/26/2019   Cath-PCI 02/26/2019: EF 55-65%. mCx 65% (FFR 0.82 - Med Rx). D2 85% - Wolverine Scoring Balloon PTCA (2.0 mm). Ost rPDA - 90% -Resolute Onyx 2.5 x 15 (2.6 mm). --> 06/2019: staged DES PCI ost D1 (RESOLUTE ONYX 2.25 mm x 50 mm (2.6 mm) positioned to avoid the true ostium)  . Elevated PSA   . GERD (gastroesophageal reflux disease)   . History of diabetes mellitus, type II    loss weight under control currently  . History of hiatal hernia    40 yrs ago  . History of pancreatitis    elevated lipase levels   . HYPERLIPIDEMIA 01/26/2009  . HYPERTENSION 01/26/2009  . 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 lower back pain, s/p cervical neck surgery"stiffness" remains  . Transfusion history    infant "anemia"    Past Surgical History:  Procedure Laterality Date  . APPENDECTOMY  ~ 1959  . BACK SURGERY    . CERVICAL DISC ARTHROPLASTY N/A 07/30/2018   Procedure: Cervical five-six Cervical six-seven Artificial disc replacement;  Surgeon: Kristeen Miss, MD;  Location: [place];  Service: Neurosurgery;  Laterality: N/A;  . CERVICAL LAMINECTOMY  1997   C 4 and C 5  . CHOLECYSTECTOMY N/A 04/15/2014   Procedure: LAPAROSCOPIC CHOLECYSTECTOMY WITH INTRAOPERATIVE CHOLANGIOGRAM;  Surgeon: Armandina Gemma, MD;  [facility];  Service: General;  Laterality: N/A;  .  COLONOSCOPY    . CORONARY BALLOON ANGIOPLASTY N/A 02/26/2019   Procedure: CORONARY BALLOON ANGIOPLASTY;  Surgeon: Leonie Man, MD;  Location: [place] [facility];  Service: Cardiovascular -> scoring balloon PTCA (Wolverine 2.0 mm) of ostial D2 85% reducing to 20-30%.  . CORONARY STENT INTERVENTION N/A 02/26/2019   Procedure: CORONARY STENT INTERVENTION;  Surgeon:  Leonie Man, MD;  Location: [place] [facility];; PCI ostial RPDA (initial attempt of PTCA only led to small local tear/dissection covered with stent) Resolute Onyx 2.5 mm x 15 mm (2.6 mm  . CORONARY STENT INTERVENTION N/A 07/05/2019   Procedure: CORONARY STENT INTERVENTION;  Surgeon: Leonie Man, MD;  Location: [place] [facility];  Service: Cardiovascular;; ostial D1 95% (restenosis of PTCA site) -> DES PCI RESOLUTE ONYX 2.25 mm x 50 mm (2.6 mm) positioned to avoid the true ostium.   . ESOPHAGOGASTRODUODENOSCOPY N/A 03/13/2014   Procedure: ESOPHAGOGASTRODUODENOSCOPY (EGD);  Surgeon: Wonda Horner, MD;  Location: [facility] ENDOSCOPY;  Service: Endoscopy;  Laterality: N/A;  . INTRAVASCULAR PRESSURE WIRE/FFR STUDY N/A 02/26/2019   Procedure: INTRAVASCULAR PRESSURE WIRE/FFR STUDY;  Surgeon: Leonie Man, MD;  Location: [place] [facility];  Service: Cardiovascular;; mCx ~65% - DFR 0.92, FR 0.82 - BORDERLINE--> MED Rx  . LEFT HEART CATH AND CORONARY ANGIOGRAPHY N/A 02/26/2019   Procedure: LEFT HEART CATH AND CORONARY ANGIOGRAPHY;  Surgeon: Leonie Man, MD;  Location: [place] [facility];  EF 55-65%. mCx 65% (FFR 0.82 - Med Rx). D2 85% - Wolverine Scoring Balloon PTCA (2.0 mm). Ost rPDA - 90% -Resolute Onyx 2.5 x 15 (2.6 mm).   . LEFT HEART CATH AND CORONARY ANGIOGRAPHY N/A 07/05/2019   Procedure: LEFT HEART CATH AND CORONARY ANGIOGRAPHY;  Surgeon: Leonie Man, MD;  Location: [facility] INVASIVE CV LAB;; ostial D1 95% (restenosis of PTCA site) -> DES PCI.  PDA stent widely patent.  Mid CX stable 65% lesion.  Proximal RCA 40%.  Mid RCA 45%.  Normal EDP.  [name] 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  . SHOULDER ARTHROSCOPY Bilateral   . SHOULDER ARTHROSCOPY W/ ROTATOR CUFF REPAIR Bilateral 2004-2009   left 2004, right 2009  . TOTAL HIP ARTHROPLASTY Left 03/04/2020   Procedure: TOTAL HIP ARTHROPLASTY ANTERIOR APPROACH;  Surgeon: Gaynelle Arabian, MD;  [facility];  Service: Orthopedics;   Laterality: Left;  152min  . TRANSTHORACIC ECHOCARDIOGRAM  02/2020   EF 55 to 60%.  GR 1 DD.  No R WMA.  Trivial MR.  Otherwise normal.  . VASECTOMY    . WISDOM TOOTH EXTRACTION      Prior to Admission medications   Medication Sig Start Date End Date Taking? Authorizing Provider  Alirocumab (PRALUENT) 150 MG/ML SOAJ Inject 150 mg/mL into the skin every 14 (fourteen) days.   Yes [provider]  bimatoprost (LUMIGAN) 0.03 % ophthalmic solution Place 1 drop into both eyes at bedtime.   Yes [provider]  Brinzolamide-Brimonidine (SIMBRINZA) 1-0.2 % SUSP Place 1 drop into both eyes in the morning and at bedtime.   Yes [provider]  clopidogrel (PLAVIX) 75 MG tablet Take 1 tablet (75 mg total) by mouth daily. 05/25/20  Yes Leonie Man, MD  losartan (COZAAR) 100 MG tablet Take 100 mg by mouth daily.   Yes [provider]  nitroGLYCERIN (NITROSTAT) 0.4 MG SL tablet Place 1 tablet (0.4 mg total) under the tongue every 5 (five) minutes as needed for chest pain. 05/25/20  Yes Ellyn Hack,  Leonie Green, MD  traZODone (DESYREL) 50 MG tablet Take 0.5-1 tablets (25-50 mg total) by mouth at bedtime as needed for sleep. Patient taking differently: Take 50 mg by mouth at bedtime. 05/06/20  Yes Burchette, Alinda Sierras, MD    Allergies  Allergen Reactions  . Penicillins Itching    Has patient had a PCN reaction causing immediate rash, facial/tongue/throat swelling, SOB or lightheadedness with hypotension: no Has patient had a PCN reaction causing severe rash involving mucus membranes or skin necrosis: unkn Has patient had a PCN reaction that required hospitalization: no Has patient had a PCN reaction occurring within the last 10 years: no If all of the above answers are "NO", then may proceed with Cephalosporin use.  Tolerated Cephalosporin Date: 03/04/20.       Social History   Socioeconomic History  . Marital status: Married    Spouse name: Not on file  . Number  of children: Not on file  . Years of education: Not on file  . Highest education level: Not on file  Occupational History  . Not on file  Tobacco Use  . Smoking status: Former Smoker    Packs/day: 1.00    Years: 5.00    Pack years: 5.00    Types: Cigarettes    Quit date: 10/17/1968    Years since quitting: 51.9  . Smokeless tobacco: Never Used  Vaping Use  . Vaping Use: Never used  Substance and Sexual Activity  . Alcohol use: Not Currently    Comment: "quit drinking ~ 10/1968  . Drug use: No  . Sexual activity: Not on file  Other Topics Concern  . Not on file  Social History Narrative  . Not on file   Social Determinants of Health   Financial Resource Strain: Low Risk   . Difficulty of Paying Living Expenses: Not hard at all  Food Insecurity: No Food Insecurity  . Worried About Charity fundraiser in the Last Year: Never true  . Ran Out of Food in the Last Year: Never true  Transportation Needs: No Transportation Needs  . Lack of Transportation (Medical): No  . Lack of Transportation (Non-Medical): No  Physical Activity: Sufficiently Active  . Days of Exercise per Week: 7 days  . Minutes of Exercise per Session: 30 min  Stress: No Stress Concern Present  . Feeling of Stress : Not at all  Social Connections: Moderately Integrated  . Frequency of Communication with Friends and Family: More than three times a week  . Frequency of Social Gatherings with Friends and Family: More than three times a week  . Attends Religious Services: More than 4 times per year  . Active Member of Clubs or Organizations: No  . Attends Archivist Meetings: Never  . Marital Status: Married  Human resources officer Violence: Not At Risk  . Fear of Current or Ex-Partner: No  . Emotionally Abused: No  . Physically Abused: No  . Sexually Abused: No    Tobacco Use: Medium Risk  . Smoking Tobacco Use: Former Smoker  . Smokeless Tobacco Use: Never Used   Social History   Substance and  Sexual Activity  Alcohol Use Not Currently   Comment: "quit drinking ~ 10/1968    Family History  Problem Relation Age of Onset  . Hypertension Mother   . Hypertension Father   . Arthritis Sister        rheumatoid  . Stroke Sister 68    Review of Systems  Constitutional: Negative for  chills and fever.  HENT: Negative for congestion, sore throat and tinnitus.   Eyes: Negative for double vision, photophobia and pain.  Respiratory: Negative for cough, shortness of breath and wheezing.   Cardiovascular: Negative for chest pain, palpitations and orthopnea.  Gastrointestinal: Negative for heartburn, nausea and vomiting.  Genitourinary: Negative for dysuria, frequency and urgency.  Musculoskeletal: Positive for joint pain.  Neurological: Negative for dizziness, weakness and headaches.     Objective:  Physical Exam: Well nourished and well developed.  General: Alert and oriented x3, cooperative and pleasant, no acute distress.  Head: normocephalic, atraumatic, neck supple.  Eyes: EOMI.  Respiratory: breath sounds clear in all fields, no wheezing, rales, or rhonchi. Cardiovascular: Regular rate and rhythm, no murmurs, gallops or rubs.  Abdomen: non-tender to palpation and soft, normoactive bowel sounds. Musculoskeletal:  Right Hip Exam:  The range of motion: Flexion to 100 degrees, Internal Rotation to 0 degrees, External Rotation to 20 degrees, and abduction to 20 degrees without discomfort.  There is no tenderness over the greater trochanteric bursa.   Calves soft and nontender. Motor function intact in LE. Strength 5/5 LE bilaterally. Neuro: Distal pulses 2+. Sensation to light touch intact in LE.  Imaging Review Plain radiographs demonstrate severe degenerative joint disease of the right hip. The bone quality appears to be adequate for age and reported activity level.  Assessment/Plan:  End stage arthritis, right hip  The patient history, physical examination, clinical  judgement of the provider and imaging studies are consistent with end stage degenerative joint disease of the right hip and total hip arthroplasty is deemed medically necessary. The treatment options including medical management, injection therapy, arthroscopy and arthroplasty were discussed at length. The risks and benefits of total hip arthroplasty were presented and reviewed. The risks due to aseptic loosening, infection, stiffness, dislocation/subluxation, thromboembolic complications and other imponderables were discussed. The patient acknowledged the explanation, agreed to proceed with the plan and consent was signed. Patient is being admitted for inpatient treatment for surgery, pain control, PT, OT, prophylactic antibiotics, VTE prophylaxis, progressive ambulation and ADLs and discharge planning.The patient is planning to be discharged home.   Patient's anticipated LOS is less than 2 midnights, meeting these requirements: - Lives within 1 hour of care - Has a competent adult at home to recover with post-op recover - NO history of  - Chronic pain requiring opioids  - Diabetes  - Heart failure  - Heart attack  - Stroke  - DVT/VTE  - Cardiac arrhythmia  - Respiratory Failure/COPD  - Renal failure  - Anemia  - Advanced Liver disease  Therapy Plans: HEP Disposition: Home with wife Planned DVT Prophylaxis: Plavix 75 mg & ASA 325 mg QD DME Needed: None PCP: Carolann Littler, MD  Cardiologist: Glenetta Hew, MD (clearance received) TXA: IV Allergies: PCN (itching) Anesthesia Concerns: None (had spinal with left THA) BMI: 26 Last HgbA1c: Not diabetic.  [facility]: [facility] ([address])  Other:  - Holding Plavix 7 days prior to surgery, will continue 81 mg ASA per cards - Recently discontinued HCTZ per Ellyn Hack due to elevated creatinine, waiting to hear back from office on continuing medication - Hx CAD with stent placement  - Patient was instructed on what medications to stop prior  to surgery. - Follow-up visit in 2 weeks with Dr. Wynelle Link - Begin physical therapy following surgery - Pre-operative lab work as pre-surgical testing - Prescriptions will be provided in hospital at time of discharge  Theresa Duty, PA-C Orthopedic Surgery [facility] [place]

## 2020-09-12 ENCOUNTER — Other Ambulatory Visit (HOSPITAL_COMMUNITY): Payer: Medicare Other

## 2020-09-15 ENCOUNTER — Other Ambulatory Visit: Payer: Self-pay | Admitting: Gastroenterology

## 2020-09-15 DIAGNOSIS — K591 Functional diarrhea: Secondary | ICD-10-CM | POA: Diagnosis not present

## 2020-09-15 DIAGNOSIS — R1013 Epigastric pain: Secondary | ICD-10-CM

## 2020-09-16 LAB — TYPE AND SCREEN
ABO/RH(D): O POS
Antibody Screen: NEGATIVE

## 2020-09-21 MED ORDER — AMLODIPINE BESYLATE 2.5 MG PO TABS: 2.5000 mg | ORAL_TABLET | Freq: Every day | ORAL | 3 refills | Status: DC

## 2020-09-22 ENCOUNTER — Ambulatory Visit
Admission: RE | Admit: 2020-09-22 | Discharge: 2020-09-22 | Disposition: A | Discharge status: Home / Self Care | Payer: Medicare Other | Source: Ambulatory Visit | Attending: Gastroenterology | Admitting: Gastroenterology

## 2020-09-22 DIAGNOSIS — K4020 Bilateral inguinal hernia, without obstruction or gangrene, not specified as recurrent: Secondary | ICD-10-CM | POA: Diagnosis not present

## 2020-09-22 DIAGNOSIS — K591 Functional diarrhea: Secondary | ICD-10-CM

## 2020-09-22 DIAGNOSIS — I700 Atherosclerosis of aorta: Secondary | ICD-10-CM | POA: Diagnosis not present

## 2020-09-22 DIAGNOSIS — K5730 Diverticulosis of large intestine without perforation or abscess without bleeding: Secondary | ICD-10-CM | POA: Diagnosis not present

## 2020-09-22 DIAGNOSIS — R1013 Epigastric pain: Secondary | ICD-10-CM

## 2020-09-22 DIAGNOSIS — R197 Diarrhea, unspecified: Secondary | ICD-10-CM | POA: Diagnosis not present

## 2020-09-22 MED ORDER — IOPAMIDOL (ISOVUE-300) INJECTION 61%
100.0000 mL | Freq: Once | INTRAVENOUS | Status: AC | PRN
  Administered 2020-09-22: 100 mL via INTRAVENOUS

## 2020-09-22 NOTE — Progress Notes (Signed)
Luis Guzman made aware to arrive at 2:15 PM 09/30/20, reviewed instructions, verbalized understanding.

## 2020-09-26 ENCOUNTER — Other Ambulatory Visit (HOSPITAL_COMMUNITY)
Admission: RE | Admit: 2020-09-26 | Discharge: 2020-09-26 | Disposition: A | Discharge status: Home / Self Care | Payer: Medicare Other | Source: Ambulatory Visit | Attending: Orthopedic Surgery | Admitting: Orthopedic Surgery

## 2020-09-26 DIAGNOSIS — Z20822 Contact with and (suspected) exposure to covid-19: Secondary | ICD-10-CM | POA: Diagnosis not present

## 2020-09-26 DIAGNOSIS — Z01812 Encounter for preprocedural laboratory examination: Secondary | ICD-10-CM | POA: Insufficient documentation

## 2020-09-26 LAB — SARS CORONAVIRUS 2 (TAT 6-24 HRS): SARS Coronavirus 2: NEGATIVE

## 2020-09-28 ENCOUNTER — Other Ambulatory Visit: Payer: Medicare Other

## 2020-09-30 ENCOUNTER — Observation Stay (HOSPITAL_COMMUNITY)
Admission: RE | Admit: 2020-09-30 | Discharge: 2020-10-01 | Disposition: A | Discharge status: Home / Self Care | Payer: Medicare Other | Source: Other Acute Inpatient Hospital | Attending: Orthopedic Surgery | Admitting: Orthopedic Surgery

## 2020-09-30 ENCOUNTER — Ambulatory Visit (HOSPITAL_COMMUNITY): Payer: Medicare Other | Admitting: Certified Registered Nurse Anesthetist

## 2020-09-30 ENCOUNTER — Ambulatory Visit (HOSPITAL_COMMUNITY): Payer: Medicare Other

## 2020-09-30 ENCOUNTER — Encounter (HOSPITAL_COMMUNITY): Payer: Self-pay | Admitting: Orthopedic Surgery

## 2020-09-30 ENCOUNTER — Other Ambulatory Visit: Payer: Self-pay

## 2020-09-30 ENCOUNTER — Observation Stay (HOSPITAL_COMMUNITY): Payer: Medicare Other

## 2020-09-30 ENCOUNTER — Encounter (HOSPITAL_COMMUNITY)
Admission: RE | Disposition: A | Discharge status: Home / Self Care | Payer: Self-pay | Source: Other Acute Inpatient Hospital | Attending: Orthopedic Surgery

## 2020-09-30 ENCOUNTER — Ambulatory Visit (HOSPITAL_COMMUNITY): Payer: Medicare Other | Admitting: Physician Assistant

## 2020-09-30 DIAGNOSIS — Z96641 Presence of right artificial hip joint: Secondary | ICD-10-CM | POA: Diagnosis not present

## 2020-09-30 DIAGNOSIS — E785 Hyperlipidemia, unspecified: Secondary | ICD-10-CM | POA: Diagnosis not present

## 2020-09-30 DIAGNOSIS — Z791 Long term (current) use of non-steroidal anti-inflammatories (NSAID): Secondary | ICD-10-CM | POA: Diagnosis not present

## 2020-09-30 DIAGNOSIS — E119 Type 2 diabetes mellitus without complications: Secondary | ICD-10-CM | POA: Diagnosis not present

## 2020-09-30 DIAGNOSIS — I10 Essential (primary) hypertension: Secondary | ICD-10-CM | POA: Diagnosis not present

## 2020-09-30 DIAGNOSIS — I25119 Atherosclerotic heart disease of native coronary artery with unspecified angina pectoris: Secondary | ICD-10-CM | POA: Diagnosis not present

## 2020-09-30 DIAGNOSIS — M4712 Other spondylosis with myelopathy, cervical region: Secondary | ICD-10-CM | POA: Diagnosis not present

## 2020-09-30 DIAGNOSIS — D649 Anemia, unspecified: Secondary | ICD-10-CM | POA: Insufficient documentation

## 2020-09-30 DIAGNOSIS — R079 Chest pain, unspecified: Secondary | ICD-10-CM | POA: Diagnosis not present

## 2020-09-30 DIAGNOSIS — R0789 Other chest pain: Secondary | ICD-10-CM | POA: Diagnosis not present

## 2020-09-30 DIAGNOSIS — I2510 Atherosclerotic heart disease of native coronary artery without angina pectoris: Secondary | ICD-10-CM | POA: Diagnosis not present

## 2020-09-30 DIAGNOSIS — M169 Osteoarthritis of hip, unspecified: Secondary | ICD-10-CM | POA: Diagnosis present

## 2020-09-30 DIAGNOSIS — Z96649 Presence of unspecified artificial hip joint: Secondary | ICD-10-CM

## 2020-09-30 DIAGNOSIS — E876 Hypokalemia: Secondary | ICD-10-CM | POA: Insufficient documentation

## 2020-09-30 DIAGNOSIS — Z79899 Other long term (current) drug therapy: Secondary | ICD-10-CM | POA: Diagnosis not present

## 2020-09-30 DIAGNOSIS — Z87891 Personal history of nicotine dependence: Secondary | ICD-10-CM | POA: Diagnosis not present

## 2020-09-30 DIAGNOSIS — M1611 Unilateral primary osteoarthritis, right hip: Principal | ICD-10-CM | POA: Diagnosis present

## 2020-09-30 DIAGNOSIS — M25551 Pain in right hip: Secondary | ICD-10-CM

## 2020-09-30 DIAGNOSIS — Z471 Aftercare following joint replacement surgery: Secondary | ICD-10-CM | POA: Diagnosis not present

## 2020-09-30 HISTORY — PX: TOTAL HIP ARTHROPLASTY: SHX124

## 2020-09-30 LAB — APTT
aPTT: 32 seconds (ref 24–36)
aPTT: 31 seconds (ref 24–36)

## 2020-09-30 LAB — BASIC METABOLIC PANEL
Anion gap: 12 (ref 5–15)
BUN: 13 mg/dL (ref 8–23)
CO2: 20 mmol/L — ABNORMAL LOW (ref 22–32)
Calcium: 9 mg/dL (ref 8.9–10.3)
Chloride: 108 mmol/L (ref 98–111)
Creatinine, Ser: 0.98 mg/dL (ref 0.61–1.24)
GFR, Estimated: >60 mL/min (ref >60)
Glucose, Bld: 232 mg/dL — ABNORMAL HIGH (ref 70–99)
Potassium: 3.9 mmol/L (ref 3.5–5.1)
Sodium: 140 mmol/L (ref 135–145)

## 2020-09-30 LAB — CBC
HCT: 47.7 % (ref 39.0–52.0)
Hemoglobin: 16.5 g/dL (ref 13.0–17.0)
MCH: 31.8 pg (ref 26.0–34.0)
MCHC: 34.6 g/dL (ref 30.0–36.0)
MCV: 91.9 fL (ref 80.0–100.0)
Platelets: 206 10*3/uL (ref 150–400)
RBC: 5.19 MIL/uL (ref 4.22–5.81)
RDW: 13.3 % (ref 11.5–15.5)
WBC: 8.9 10*3/uL (ref 4.0–10.5)
nRBC: 0 % (ref 0.0–0.2)

## 2020-09-30 LAB — COMPREHENSIVE METABOLIC PANEL
ALT: 19 U/L (ref 0–44)
AST: 22 U/L (ref 15–41)
Albumin: 4.6 g/dL (ref 3.5–5.0)
Alkaline Phosphatase: 94 U/L (ref 38–126)
Anion gap: 10 (ref 5–15)
BUN: 12 mg/dL (ref 8–23)
CO2: 21 mmol/L — ABNORMAL LOW (ref 22–32)
Calcium: 9.1 mg/dL (ref 8.9–10.3)
Chloride: 109 mmol/L (ref 98–111)
Creatinine, Ser: 0.82 mg/dL (ref 0.61–1.24)
GFR, Estimated: >60 mL/min (ref >60)
Glucose, Bld: 118 mg/dL — ABNORMAL HIGH (ref 70–99)
Potassium: 3.6 mmol/L (ref 3.5–5.1)
Sodium: 140 mmol/L (ref 135–145)
Total Bilirubin: 1 mg/dL (ref 0.3–1.2)
Total Protein: 7.4 g/dL (ref 6.5–8.1)

## 2020-09-30 LAB — TROPONIN I (HIGH SENSITIVITY)
Troponin I (High Sensitivity): 5 ng/L (ref <18)
Troponin I (High Sensitivity): 10 ng/L (ref <18)

## 2020-09-30 LAB — TYPE AND SCREEN
ABO/RH(D): O POS
Antibody Screen: NEGATIVE

## 2020-09-30 LAB — LACTIC ACID, PLASMA
Lactic Acid, Venous: 4.1 mmol/L (ref 0.5–1.9)
Lactic Acid, Venous: 3.7 mmol/L (ref 0.5–1.9)

## 2020-09-30 LAB — PROTIME-INR
INR: 1 (ref 0.8–1.2)
INR: 1.1 (ref 0.8–1.2)
Prothrombin Time: 13.1 seconds (ref 11.4–15.2)
Prothrombin Time: 14 seconds (ref 11.4–15.2)

## 2020-09-30 LAB — MAGNESIUM: Magnesium: 1.9 mg/dL (ref 1.7–2.4)

## 2020-09-30 LAB — PHOSPHORUS: Phosphorus: 1.6 mg/dL — ABNORMAL LOW (ref 2.5–4.6)

## 2020-09-30 SURGERY — ARTHROPLASTY, HIP, TOTAL, ANTERIOR APPROACH
Anesthesia: Spinal | Site: Hip | Laterality: Right

## 2020-09-30 MED ORDER — BISACODYL 10 MG RE SUPP: 10.0000 mg | Freq: Every day | RECTAL | Status: DC | PRN

## 2020-09-30 MED ORDER — PHENYLEPHRINE 40 MCG/ML (10ML) SYRINGE FOR IV PUSH (FOR BLOOD PRESSURE SUPPORT)
PREFILLED_SYRINGE | INTRAVENOUS | Status: DC | PRN
  Administered 2020-09-30: 120 ug via INTRAVENOUS

## 2020-09-30 MED ORDER — TRAMADOL HCL 50 MG PO TABS
50.0000 mg | ORAL_TABLET | Freq: Four times a day (QID) | ORAL | Status: DC | PRN
  Administered 2020-10-01: 100 mg via ORAL
  Filled 2020-09-30: qty 2

## 2020-09-30 MED ORDER — LACTATED RINGERS IV SOLN: INTRAVENOUS | Status: DC

## 2020-09-30 MED ORDER — POVIDONE-IODINE 10 % EX SWAB
2.0000 "application " | Freq: Once | CUTANEOUS | Status: AC
  Administered 2020-09-30: 2 via TOPICAL

## 2020-09-30 MED ORDER — PROPOFOL 1000 MG/100ML IV EMUL
INTRAVENOUS | Status: AC
  Filled 2020-09-30: qty 100

## 2020-09-30 MED ORDER — DEXAMETHASONE SODIUM PHOSPHATE 10 MG/ML IJ SOLN: 8.0000 mg | Freq: Once | INTRAMUSCULAR | Status: DC

## 2020-09-30 MED ORDER — DEXAMETHASONE SODIUM PHOSPHATE 10 MG/ML IJ SOLN
INTRAMUSCULAR | Status: DC | PRN
  Administered 2020-09-30: 10 mg via INTRAVENOUS

## 2020-09-30 MED ORDER — METOCLOPRAMIDE HCL 5 MG/ML IJ SOLN: 5.0000 mg | Freq: Three times a day (TID) | INTRAMUSCULAR | Status: DC | PRN

## 2020-09-30 MED ORDER — METHOCARBAMOL 500 MG IVPB - SIMPLE MED
500.0000 mg | Freq: Four times a day (QID) | INTRAVENOUS | Status: DC | PRN
  Filled 2020-09-30: qty 50

## 2020-09-30 MED ORDER — ASPIRIN 81 MG PO CHEW
81.0000 mg | CHEWABLE_TABLET | ORAL | Status: AC
  Administered 2020-09-30: 81 mg via ORAL

## 2020-09-30 MED ORDER — ORAL CARE MOUTH RINSE: 15.0000 mL | Freq: Once | OROMUCOSAL | Status: AC

## 2020-09-30 MED ORDER — ONDANSETRON HCL 4 MG/2ML IJ SOLN
INTRAMUSCULAR | Status: AC
  Filled 2020-09-30: qty 2

## 2020-09-30 MED ORDER — METOCLOPRAMIDE HCL 5 MG PO TABS
5.0000 mg | ORAL_TABLET | Freq: Three times a day (TID) | ORAL | Status: DC | PRN
Start: 2020-09-30 — End: 2020-10-01

## 2020-09-30 MED ORDER — NITROGLYCERIN 0.4 MG SL SUBL
0.4000 mg | SUBLINGUAL_TABLET | SUBLINGUAL | Status: DC | PRN
  Administered 2020-09-30 (×2): 0.4 mg via SUBLINGUAL
  Filled 2020-09-30: qty 1

## 2020-09-30 MED ORDER — CEFAZOLIN SODIUM-DEXTROSE 2-4 GM/100ML-% IV SOLN
2.0000 g | INTRAVENOUS | Status: AC
  Administered 2020-09-30: 2 g via INTRAVENOUS
  Filled 2020-09-30: qty 100

## 2020-09-30 MED ORDER — LATANOPROST 0.005 % OP SOLN
1.0000 [drp] | Freq: Every day | OPHTHALMIC | Status: DC
  Administered 2020-09-30: 1 [drp] via OPHTHALMIC
  Filled 2020-09-30: qty 2.5

## 2020-09-30 MED ORDER — POLYETHYLENE GLYCOL 3350 17 G PO PACK: 17.0000 g | PACK | Freq: Every day | ORAL | Status: DC | PRN

## 2020-09-30 MED ORDER — FENTANYL CITRATE (PF) 100 MCG/2ML IJ SOLN
25.0000 ug | INTRAMUSCULAR | Status: DC | PRN
Start: 2020-09-30 — End: 2020-09-30

## 2020-09-30 MED ORDER — MORPHINE SULFATE (PF) 2 MG/ML IV SOLN
0.5000 mg | INTRAVENOUS | Status: DC | PRN
  Administered 2020-09-30 (×3): 1 mg via INTRAVENOUS
  Filled 2020-09-30 (×3): qty 1

## 2020-09-30 MED ORDER — MENTHOL 3 MG MT LOZG: 1.0000 | LOZENGE | OROMUCOSAL | Status: DC | PRN

## 2020-09-30 MED ORDER — PROPOFOL 500 MG/50ML IV EMUL
INTRAVENOUS | Status: DC | PRN
  Administered 2020-09-30: 100 ug/kg/min via INTRAVENOUS

## 2020-09-30 MED ORDER — CLOPIDOGREL BISULFATE 75 MG PO TABS
75.0000 mg | ORAL_TABLET | Freq: Every day | ORAL | Status: DC
  Administered 2020-10-01: 75 mg via ORAL
  Filled 2020-09-30: qty 1

## 2020-09-30 MED ORDER — ONDANSETRON HCL 4 MG/2ML IJ SOLN: 4.0000 mg | Freq: Four times a day (QID) | INTRAMUSCULAR | Status: DC | PRN

## 2020-09-30 MED ORDER — PROPOFOL 10 MG/ML IV BOLUS
INTRAVENOUS | Status: DC | PRN
  Administered 2020-09-30: 20 mg via INTRAVENOUS
  Administered 2020-09-30: 40 mg via INTRAVENOUS

## 2020-09-30 MED ORDER — DOCUSATE SODIUM 100 MG PO CAPS
100.0000 mg | ORAL_CAPSULE | Freq: Two times a day (BID) | ORAL | Status: DC
  Administered 2020-09-30 – 2020-10-01 (×2): 100 mg via ORAL
  Filled 2020-09-30 (×2): qty 1

## 2020-09-30 MED ORDER — 0.9 % SODIUM CHLORIDE (POUR BTL) OPTIME
TOPICAL | Status: DC | PRN
  Administered 2020-09-30: 1000 mL

## 2020-09-30 MED ORDER — BUPIVACAINE-EPINEPHRINE (PF) 0.25% -1:200000 IJ SOLN
INTRAMUSCULAR | Status: AC
  Filled 2020-09-30: qty 30

## 2020-09-30 MED ORDER — HYDROCODONE-ACETAMINOPHEN 5-325 MG PO TABS
1.0000 | ORAL_TABLET | ORAL | Status: DC | PRN
  Administered 2020-09-30 – 2020-10-01 (×3): 2 via ORAL
  Filled 2020-09-30 (×3): qty 2

## 2020-09-30 MED ORDER — DEXAMETHASONE SODIUM PHOSPHATE 10 MG/ML IJ SOLN
INTRAMUSCULAR | Status: AC
  Filled 2020-09-30: qty 1

## 2020-09-30 MED ORDER — HYDROCODONE-ACETAMINOPHEN 7.5-325 MG PO TABS
1.0000 | ORAL_TABLET | ORAL | Status: DC | PRN
  Administered 2020-09-30 – 2020-10-01 (×2): 2 via ORAL
  Filled 2020-09-30 (×3): qty 2

## 2020-09-30 MED ORDER — PHENYLEPHRINE 40 MCG/ML (10ML) SYRINGE FOR IV PUSH (FOR BLOOD PRESSURE SUPPORT)
PREFILLED_SYRINGE | INTRAVENOUS | Status: AC
  Filled 2020-09-30: qty 10

## 2020-09-30 MED ORDER — CEFAZOLIN SODIUM-DEXTROSE 2-4 GM/100ML-% IV SOLN
2.0000 g | Freq: Four times a day (QID) | INTRAVENOUS | Status: AC
  Administered 2020-09-30 (×2): 2 g via INTRAVENOUS
  Filled 2020-09-30 (×2): qty 100

## 2020-09-30 MED ORDER — AMLODIPINE BESYLATE 5 MG PO TABS
2.5000 mg | ORAL_TABLET | Freq: Every day | ORAL | Status: DC
  Administered 2020-10-01: 2.5 mg via ORAL
  Filled 2020-09-30: qty 1

## 2020-09-30 MED ORDER — BUPIVACAINE-EPINEPHRINE (PF) 0.25% -1:200000 IJ SOLN
INTRAMUSCULAR | Status: DC | PRN
  Administered 2020-09-30: 30 mL

## 2020-09-30 MED ORDER — ACETAMINOPHEN 325 MG PO TABS: 325.0000 mg | ORAL_TABLET | Freq: Four times a day (QID) | ORAL | Status: DC | PRN

## 2020-09-30 MED ORDER — ONDANSETRON HCL 4 MG/2ML IJ SOLN
INTRAMUSCULAR | Status: DC | PRN
  Administered 2020-09-30: 4 mg via INTRAVENOUS

## 2020-09-30 MED ORDER — ONDANSETRON HCL 4 MG PO TABS: 4.0000 mg | ORAL_TABLET | Freq: Four times a day (QID) | ORAL | Status: DC | PRN

## 2020-09-30 MED ORDER — MAGNESIUM CITRATE PO SOLN: 1.0000 | Freq: Once | ORAL | Status: DC | PRN

## 2020-09-30 MED ORDER — SODIUM CHLORIDE 0.9 % IV SOLN: INTRAVENOUS | Status: DC

## 2020-09-30 MED ORDER — LOSARTAN POTASSIUM 50 MG PO TABS
100.0000 mg | ORAL_TABLET | Freq: Every day | ORAL | Status: DC
  Administered 2020-10-01: 100 mg via ORAL
  Filled 2020-09-30: qty 2

## 2020-09-30 MED ORDER — ASPIRIN EC 325 MG PO TBEC
325.0000 mg | DELAYED_RELEASE_TABLET | Freq: Every day | ORAL | Status: DC
  Administered 2020-10-01: 325 mg via ORAL
  Filled 2020-09-30 (×2): qty 1

## 2020-09-30 MED ORDER — TRANEXAMIC ACID-NACL 1000-0.7 MG/100ML-% IV SOLN
1000.0000 mg | INTRAVENOUS | Status: AC
  Administered 2020-09-30: 1000 mg via INTRAVENOUS
  Filled 2020-09-30: qty 100

## 2020-09-30 MED ORDER — ISOSORBIDE MONONITRATE ER 30 MG PO TB24
30.0000 mg | ORAL_TABLET | Freq: Every day | ORAL | Status: DC
  Administered 2020-09-30 – 2020-10-01 (×2): 30 mg via ORAL
  Filled 2020-09-30 (×2): qty 1

## 2020-09-30 MED ORDER — AMISULPRIDE (ANTIEMETIC) 5 MG/2ML IV SOLN: 10.0000 mg | Freq: Once | INTRAVENOUS | Status: DC | PRN

## 2020-09-30 MED ORDER — ACETAMINOPHEN 10 MG/ML IV SOLN
1000.0000 mg | Freq: Four times a day (QID) | INTRAVENOUS | Status: DC
  Administered 2020-09-30: 1000 mg via INTRAVENOUS
  Filled 2020-09-30: qty 100

## 2020-09-30 MED ORDER — DEXAMETHASONE SODIUM PHOSPHATE 10 MG/ML IJ SOLN
10.0000 mg | Freq: Once | INTRAMUSCULAR | Status: AC
  Administered 2020-10-01: 10 mg via INTRAVENOUS
  Filled 2020-09-30: qty 1

## 2020-09-30 MED ORDER — ASPIRIN 81 MG PO CHEW
CHEWABLE_TABLET | ORAL | Status: AC
  Filled 2020-09-30: qty 1

## 2020-09-30 MED ORDER — TRAZODONE HCL 50 MG PO TABS
25.0000 mg | ORAL_TABLET | Freq: Every evening | ORAL | Status: DC | PRN
  Administered 2020-10-01: 50 mg via ORAL
  Filled 2020-09-30: qty 1

## 2020-09-30 MED ORDER — PHENOL 1.4 % MT LIQD: 1.0000 | OROMUCOSAL | Status: DC | PRN

## 2020-09-30 MED ORDER — WATER FOR IRRIGATION, STERILE IR SOLN
Status: DC | PRN
  Administered 2020-09-30: 2000 mL

## 2020-09-30 MED ORDER — CHLORHEXIDINE GLUCONATE 0.12 % MT SOLN
15.0000 mL | Freq: Once | OROMUCOSAL | Status: AC
  Administered 2020-09-30: 15 mL via OROMUCOSAL

## 2020-09-30 MED ORDER — BUPIVACAINE IN DEXTROSE 0.75-8.25 % IT SOLN
INTRATHECAL | Status: DC | PRN
  Administered 2020-09-30: 1.8 mL via INTRATHECAL

## 2020-09-30 MED ORDER — METHOCARBAMOL 500 MG PO TABS
500.0000 mg | ORAL_TABLET | Freq: Four times a day (QID) | ORAL | Status: DC | PRN
  Administered 2020-09-30 – 2020-10-01 (×4): 500 mg via ORAL
  Filled 2020-09-30 (×4): qty 1

## 2020-09-30 SURGICAL SUPPLY — 42 items
BAG DECANTER FOR FLEXI CONT (MISCELLANEOUS) IMPLANT
BAG SPEC THK2 15X12 ZIP CLS (MISCELLANEOUS) ×1
BAG ZIPLOCK 12X15 (MISCELLANEOUS) ×1 IMPLANT
BLADE SAG 18X100X1.27 (BLADE) ×2 IMPLANT
COVER PERINEAL POST (MISCELLANEOUS) ×2 IMPLANT
COVER SURGICAL LIGHT HANDLE (MISCELLANEOUS) ×2 IMPLANT
COVER WAND RF STERILE (DRAPES) ×1 IMPLANT
CUP ACETBLR 52 OD PINNACLE (Hips) ×1 IMPLANT
DECANTER SPIKE VIAL GLASS SM (MISCELLANEOUS) ×2 IMPLANT
DRAPE STERI IOBAN 125X83 (DRAPES) ×2 IMPLANT
DRAPE U-SHAPE 47X51 STRL (DRAPES) ×4 IMPLANT
DRSG AQUACEL AG ADV 3.5X10 (GAUZE/BANDAGES/DRESSINGS) ×2 IMPLANT
DURAPREP 26ML APPLICATOR (WOUND CARE) ×2 IMPLANT
ELECT REM PT RETURN 15FT ADLT (MISCELLANEOUS) ×2 IMPLANT
EVACUATOR 1/8 PVC DRAIN (DRAIN) IMPLANT
GLOVE SRG 8 PF TXTR STRL LF DI (GLOVE) ×1 IMPLANT
GLOVE SURG ENC MOIS LTX SZ6 (GLOVE) ×1 IMPLANT
GLOVE SURG ENC MOIS LTX SZ7 (GLOVE) ×2 IMPLANT
GLOVE SURG ENC MOIS LTX SZ8 (GLOVE) ×2 IMPLANT
GLOVE SURG ENC TEXT LTX SZ7 (GLOVE) IMPLANT
GLOVE SURG UNDER POLY LF SZ6.5 (GLOVE) ×1 IMPLANT
GLOVE SURG UNDER POLY LF SZ8 (GLOVE) ×2
GLOVE SURG UNDER POLY LF SZ8.5 (GLOVE) ×1 IMPLANT
GOWN STRL REUS W/TWL LRG LVL3 (GOWN DISPOSABLE) ×3 IMPLANT
GOWN STRL REUS W/TWL XL LVL3 (GOWN DISPOSABLE) ×1 IMPLANT
HEAD FEMORAL 32 CERAMIC (Hips) ×1 IMPLANT
HOLDER FOLEY CATH W/STRAP (MISCELLANEOUS) ×2 IMPLANT
KIT TURNOVER KIT A (KITS) ×2 IMPLANT
LINER MARATHON NEUT +4X52X32 (Hips) ×1 IMPLANT
MANIFOLD NEPTUNE II (INSTRUMENTS) ×2 IMPLANT
PACK ANTERIOR HIP CUSTOM (KITS) ×2 IMPLANT
PENCIL SMOKE EVACUATOR COATED (MISCELLANEOUS) ×2 IMPLANT
STEM FEM ACTIS HIGH SZ8 (Stem) ×1 IMPLANT
STRIP CLOSURE SKIN 1/2X4 (GAUZE/BANDAGES/DRESSINGS) ×3 IMPLANT
SUT ETHIBOND NAB CT1 #1 30IN (SUTURE) ×2 IMPLANT
SUT MNCRL AB 4-0 PS2 18 (SUTURE) ×2 IMPLANT
SUT STRATAFIX 0 PDS 27 VIOLET (SUTURE) ×2
SUT VIC AB 2-0 CT1 27 (SUTURE) ×4
SUT VIC AB 2-0 CT1 TAPERPNT 27 (SUTURE) ×2 IMPLANT
SUTURE STRATFX 0 PDS 27 VIOLET (SUTURE) ×1 IMPLANT
SYR 50ML LL SCALE MARK (SYRINGE) IMPLANT
TRAY FOLEY MTR SLVR 16FR STAT (SET/KITS/TRAYS/PACK) ×2 IMPLANT

## 2020-09-30 NOTE — Progress Notes (Addendum)
While seeing Luis Guzman on post-op rounds , he was complaining of 8/10 substernal chest pain that developed a few minutes earlier. It was not radiating but was crushing in nature. Nursing had already called rapid response and he was given a sublingual NTG and 81 mg Aspirin. He responded well to the NTG with significant decrease in pain. Vitals stable with tachycardia to 100. EKG with sinus tach but no acute ischemia. Cardiology was called for a consult given his history of CAD with 2 stents. He is being moved to telemetry and will be seen by cardiology this evening. He was feeling better at this time.

## 2020-09-30 NOTE — Op Note (Signed)
OPERATIVE REPORT- TOTAL HIP ARTHROPLASTY   PREOPERATIVE DIAGNOSIS: Osteoarthritis of the Right hip.   POSTOPERATIVE DIAGNOSIS: Osteoarthritis of the Right  hip.   PROCEDURE: Right total hip arthroplasty, anterior approach.   SURGEON: Gaynelle Arabian, MD   ASSISTANT: Fenton Foy, PA-C  ANESTHESIA:  Spinal  ESTIMATED BLOOD LOSS:-300 mL    DRAINS: Hemovac x1.   COMPLICATIONS: None   CONDITION: PACU - hemodynamically stable.   BRIEF CLINICAL NOTE: Luis Guzman is a 75 y.o. male who has advanced end-  stage arthritis of their Right  hip with progressively worsening pain and  dysfunction.The patient has failed nonoperative management and presents for  total hip arthroplasty.   PROCEDURE IN DETAIL: After successful administration of spinal  anesthetic, the traction boots for the [facility] bed were placed on both  feet and the patient was placed onto the [facility] bed, boots placed into the leg  holders. The Right hip was then isolated from the perineum with plastic  drapes and prepped and draped in the usual sterile fashion. ASIS and  greater trochanter were marked and a oblique incision was made, starting  at about 1 cm lateral and 2 cm distal to the ASIS and coursing towards  the anterior cortex of the femur. The skin was cut with a 10 blade  through subcutaneous tissue to the level of the fascia overlying the  tensor fascia lata muscle. The fascia was then incised in line with the  incision at the junction of the anterior third and posterior 2/3rd. The  muscle was teased off the fascia and then the interval between the TFL  and the rectus was developed. The Hohmann retractor was then placed at  the top of the femoral neck over the capsule. The vessels overlying the  capsule were cauterized and the fat on top of the capsule was removed.  A Hohmann retractor was then placed anterior underneath the rectus  femoris to give exposure to the entire anterior capsule. A T-shaped   capsulotomy was performed. The edges were tagged and the femoral head  was identified.       Osteophytes are removed off the superior acetabulum.  The femoral neck was then cut in situ with an oscillating saw. Traction  was then applied to the left lower extremity utilizing the [facility]  traction. The femoral head was then removed. Retractors were placed  around the acetabulum and then circumferential removal of the labrum was  performed. Osteophytes were also removed. Reaming starts at 49 mm to  medialize and  Increased in 2 mm increments to 51 mm. We reamed in  approximately 40 degrees of abduction, 20 degrees anteversion. A 52 mm  pinnacle acetabular shell was then impacted in anatomic position under  fluoroscopic guidance with excellent purchase. We did not need to place  any additional dome screws. A 32 mm neutral + 4 marathon liner was then  placed into the acetabular shell.       The femoral lift was then placed along the lateral aspect of the femur  just distal to the vastus ridge. The leg was  externally rotated and capsule  was stripped off the inferior aspect of the femoral neck down to the  level of the lesser trochanter, this was done with electrocautery. The femur was lifted after this was performed. The  leg was then placed in an extended and adducted position essentially delivering the femur. We also removed the capsule superiorly and the piriformis from the piriformis  fossa to gain excellent exposure of the  proximal femur. Rongeur was used to remove some cancellous bone to get  into the lateral portion of the proximal femur for placement of the  initial starter reamer. The starter broaches was placed  the starter broach  and was shown to go down the center of the canal. Broaching  with the Actis system was then performed starting at size 0  coursing  Up to size 8. A size 8 had excellent torsional and rotational  and axial stability. The trial high offset neck was then placed   with a 32 + 1 trial head. The hip was then reduced. We confirmed that  the stem was in the canal both on AP and lateral x-rays. It also has excellent sizing. The hip was reduced with outstanding stability through full extension and full external rotation.. AP pelvis was taken and the leg lengths were measured and found to be equal. Hip was then dislocated again and the femoral head and neck removed. The  femoral broach was removed. Size 8 Actis stem with a high offset  neck was then impacted into the femur following native anteversion. Has  excellent purchase in the canal. Excellent torsional and rotational and  axial stability. It is confirmed to be in the canal on AP and lateral  fluoroscopic views. The 32 + 1 ceramic head was placed and the hip  reduced with outstanding stability. Again AP pelvis was taken and it  confirmed that the leg lengths were equal. The wound was then copiously  irrigated with saline solution and the capsule reattached and repaired  with Ethibond suture. 30 ml of .25% Bupivicaine was  injected into the capsule and into the edge of the tensor fascia lata as well as subcutaneous tissue. The fascia overlying the tensor fascia lata was then closed with a running #1 V-Loc. Subcu was closed with interrupted 2-0 Vicryl and subcuticular running 4-0 Monocryl. Incision was cleaned  and dried. Steri-Strips and a bulky sterile dressing applied. Hemovac  drain was hooked to suction and then the patient was awakened and transported to  recovery in stable condition.        Please note that a surgical assistant was a medical necessity for this procedure to perform it in a safe and expeditious manner. Assistant was necessary to provide appropriate retraction of vital neurovascular structures and to prevent femoral fracture and allow for anatomic placement of the prosthesis.  Gaynelle Arabian, M.D.

## 2020-09-30 NOTE — Progress Notes (Signed)
Report called Clarene Duke on 4th floor.

## 2020-09-30 NOTE — Care Plan (Signed)
Ortho Bundle Case Management Note  Patient Details  Name: Luis Guzman MRN: [id number] Date of Birth: Aug 13, 1946                  R THA on 09/30/20. DCP: Home with wife. 1 story home with 2 steps. DME: No needs. Has RW & 3in1. PT: HEP   DME Arranged:  N/A DME Agency:     HH Arranged:    [place] Agency:     Additional Comments: Please contact me with any questions of if this plan should need to change.  Marianne Sofia, RN,CCM [facility]  [phone] 09/30/2020, 11:31 AM

## 2020-09-30 NOTE — Consult Note (Signed)
Cardiology Consultation:   Patient ID: Luis Guzman MRN: [id number]; DOB: [birth year]  Admit date: 09/30/2020 Date of Consult: 09/30/2020  PCP:  Eulas Post, MD   [place]  Cardiologist:  Glenetta Hew, MD  Advanced Practice Provider:  No care team member to display Electrophysiologist:  None    Patient Profile:   Luis Guzman is a 75 y.o. male with a hx of CAD, abdominal aortic aneurysm, T2DM, hypertension, hyperlipidemia who is being seen today for the evaluation of chest pain at the request of Dr Wynelle Link.  History of Present Illness:   Mr. Current is a 75 year old male with a history of CAD with PCI to PDA and diagonal, abdominal aortic aneurysm, T2DM, hypertension, hyperlipidemia who we are consulted to see for chest pain postoperatively.  He follows with Dr. Ellyn Hack, was last seen on 05/25/2020.  Last cath was on 07/05/2019, showed ostial D1 95% stenosis, treated with DES.  Clinic appointment his aspirin was discontinued and he was continued on Plavix only.  He was instructed for surgeries to hold Plavix for 5 days and replaced with aspirin while off Plavix.  Most recent echo 02/14/2020 showed EF 55 to 60%, normal RV function mild AI.    He underwent right total hip arthroplasty today.  After the procedure he was resting in his bed when he had the onset of chest pain.  Describes as crushing pain in center of his chest.  Does state it felt similar to prior MI.  Lasted for about 5 minutes, resolved after sublingual nitroglycerin x1.  Reports that 1 to 2 hours later had second episode of substernal chest pain, not as intense as previous.  Also lasted about 5 minutes and resolved following 1 nitroglycerin.  Reports he has been chest pain-free since that time.  EKG shows sinus tachycardia, no ST abnormalities.  Labs show creatinine 0.98, troponin 5 >10, lactate 4.1> 3.7.  Most recent vitals BP 138/77, P 88.  Past Medical History:  Diagnosis Date  . AAA (abdominal  aortic aneurysm) ([place])   . Anemia    as infant history of  . CAD S/P PTCA & DES PCI - for Progressive Angina 02/26/2019   Cath-PCI 02/26/2019: EF 55-65%. mCx 65% (FFR 0.82 - Med Rx). D2 85% - Wolverine Scoring Balloon PTCA (2.0 mm). Ost rPDA - 90% -Resolute Onyx 2.5 x 15 (2.6 mm). --> 06/2019: staged DES PCI ost D1 (RESOLUTE ONYX 2.25 mm x 50 mm (2.6 mm) positioned to avoid the true ostium)  . Elevated PSA   . GERD (gastroesophageal reflux disease)   . History of diabetes mellitus, type II    loss weight under control currently  . History of hiatal hernia    40 yrs ago  . History of pancreatitis    elevated lipase levels   . HYPERLIPIDEMIA 01/26/2009  . HYPERTENSION 01/26/2009  . 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 lower back pain, s/p cervical neck surgery"stiffness" remains  . Transfusion history    infant "anemia"    Past Surgical History:  Procedure Laterality Date  . APPENDECTOMY  ~ 1959  . BACK SURGERY    . CERVICAL DISC ARTHROPLASTY N/A 07/30/2018   Procedure: Cervical five-six Cervical six-seven Artificial disc replacement;  Surgeon: Kristeen Miss, MD;  Location: [place];  Service: Neurosurgery;  Laterality: N/A;  . CERVICAL LAMINECTOMY  1997   C 4 and C 5  . CHOLECYSTECTOMY N/A 04/15/2014   Procedure: LAPAROSCOPIC CHOLECYSTECTOMY WITH INTRAOPERATIVE CHOLANGIOGRAM;  Surgeon:  Armandina Gemma, MD;  Location: [facility];  Service: General;  Laterality: N/A;  . COLONOSCOPY    . CORONARY BALLOON ANGIOPLASTY N/A 02/26/2019   Procedure: CORONARY BALLOON ANGIOPLASTY;  Surgeon: Leonie Man, MD;  Location: [place] [facility];  Service: Cardiovascular -> scoring balloon PTCA (Wolverine 2.0 mm) of ostial D2 85% reducing to 20-30%.  . CORONARY STENT INTERVENTION N/A 02/26/2019   Procedure: CORONARY STENT INTERVENTION;  Surgeon: Leonie Man, MD;  Location: [place] [facility];; PCI ostial RPDA (initial attempt of PTCA only led to small local tear/dissection covered with  stent) Resolute Onyx 2.5 mm x 15 mm (2.6 mm  . CORONARY STENT INTERVENTION N/A 07/05/2019   Procedure: CORONARY STENT INTERVENTION;  Surgeon: Leonie Man, MD;  Location: [place] [facility];  Service: Cardiovascular;; ostial D1 95% (restenosis of PTCA site) -> DES PCI RESOLUTE ONYX 2.25 mm x 50 mm (2.6 mm) positioned to avoid the true ostium.   . ESOPHAGOGASTRODUODENOSCOPY N/A 03/13/2014   Procedure: ESOPHAGOGASTRODUODENOSCOPY (EGD);  Surgeon: Wonda Horner, MD;  Location: [facility];  Service: Endoscopy;  Laterality: N/A;  . INTRAVASCULAR PRESSURE WIRE/FFR STUDY N/A 02/26/2019   Procedure: INTRAVASCULAR PRESSURE WIRE/FFR STUDY;  Surgeon: Leonie Man, MD;  Location: [place] [facility];  Service: Cardiovascular;; mCx ~65% - DFR 0.92, FR 0.82 - BORDERLINE--> MED Rx  . LEFT HEART CATH AND CORONARY ANGIOGRAPHY N/A 02/26/2019   Procedure: LEFT HEART CATH AND CORONARY ANGIOGRAPHY;  Surgeon: Leonie Man, MD;  Location: [place] [facility];  EF 55-65%. mCx 65% (FFR 0.82 - Med Rx). D2 85% - Wolverine Scoring Balloon PTCA (2.0 mm). Ost rPDA - 90% -Resolute Onyx 2.5 x 15 (2.6 mm).   . LEFT HEART CATH AND CORONARY ANGIOGRAPHY N/A 07/05/2019   Procedure: LEFT HEART CATH AND CORONARY ANGIOGRAPHY;  Surgeon: Leonie Man, MD;  Location: [facility] INVASIVE CV LAB;; ostial D1 95% (restenosis of PTCA site) -> DES PCI.  PDA stent widely patent.  Mid CX stable 65% lesion.  Proximal RCA 40%.  Mid RCA 45%.  Normal EDP.  [name] 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  . SHOULDER ARTHROSCOPY Bilateral   . SHOULDER ARTHROSCOPY W/ ROTATOR CUFF REPAIR Bilateral 2004-2009   left 2004, right 2009  . TOTAL HIP ARTHROPLASTY Left 03/04/2020   Procedure: TOTAL HIP ARTHROPLASTY ANTERIOR APPROACH;  Surgeon: Gaynelle Arabian, MD;  Location: [facility];  Service: Orthopedics;  Laterality: Left;  162min  . TRANSTHORACIC ECHOCARDIOGRAM  02/2020   EF 55 to 60%.  GR 1 DD.  No R WMA.  Trivial MR.  Otherwise normal.  .  VASECTOMY    . WISDOM TOOTH EXTRACTION       Inpatient Medications: Scheduled Meds: . [START ON 10/01/2020] amLODipine  2.5 mg Oral Daily  . aspirin      . [START ON 10/01/2020] aspirin EC  325 mg Oral Daily  . [START ON 10/01/2020] clopidogrel  75 mg Oral Daily  . [START ON 10/01/2020] dexamethasone (DECADRON) injection  10 mg Intravenous Once  . docusate sodium  100 mg Oral BID  . latanoprost  1 drop Both Eyes QHS  . [START ON 10/01/2020] losartan  100 mg Oral Daily   Continuous Infusions: . sodium chloride 75 mL/hr at 09/30/20 1908  .  ceFAZolin (ANCEF) IV 2 g (09/30/20 1710)  . methocarbamol (ROBAXIN) IV     PRN Meds: [START ON 10/01/2020] acetaminophen, bisacodyl, HYDROcodone-acetaminophen, HYDROcodone-acetaminophen, magnesium citrate, menthol-cetylpyridinium **OR** phenol, methocarbamol **OR** methocarbamol (ROBAXIN)  IV, metoCLOPramide **OR** metoCLOPramide (REGLAN) injection, morphine injection, nitroGLYCERIN, ondansetron **OR** ondansetron (ZOFRAN) IV, polyethylene glycol, traMADol, traZODone  Allergies:    Allergies  Allergen Reactions  . Penicillins Itching    Has patient had a PCN reaction causing immediate rash, facial/tongue/throat swelling, SOB or lightheadedness with hypotension: no Has patient had a PCN reaction causing severe rash involving mucus membranes or skin necrosis: unkn Has patient had a PCN reaction that required hospitalization: no Has patient had a PCN reaction occurring within the last 10 years: no If all of the above answers are "NO", then may proceed with Cephalosporin use.  Tolerated Cephalosporin Date: 03/04/20.       Social History:   Social History   Socioeconomic History  . Marital status: Married    Spouse name: Not on file  . Number of children: Not on file  . Years of education: Not on file  . Highest education level: Not on file  Occupational History  . Not on file  Tobacco Use  . Smoking status: Former Smoker    Packs/day: 1.00     Years: 5.00    Pack years: 5.00    Types: Cigarettes    Quit date: 10/17/1968    Years since quitting: 51.9  . Smokeless tobacco: Never Used  Vaping Use  . Vaping Use: Never used  Substance and Sexual Activity  . Alcohol use: Not Currently    Comment: "quit drinking ~ 10/1968  . Drug use: No  . Sexual activity: Not on file  Other Topics Concern  . Not on file  Social History Narrative  . Not on file   Social Determinants of Health   Financial Resource Strain: Low Risk   . Difficulty of Paying Living Expenses: Not hard at all  Food Insecurity: No Food Insecurity  . Worried About Charity fundraiser in the Last Year: Never true  . Ran Out of Food in the Last Year: Never true  Transportation Needs: No Transportation Needs  . Lack of Transportation (Medical): No  . Lack of Transportation (Non-Medical): No  Physical Activity: Sufficiently Active  . Days of Exercise per Week: 7 days  . Minutes of Exercise per Session: 30 min  Stress: No Stress Concern Present  . Feeling of Stress : Not at all  Social Connections: Moderately Integrated  . Frequency of Communication with Friends and Family: More than three times a week  . Frequency of Social Gatherings with Friends and Family: More than three times a week  . Attends Religious Services: More than 4 times per year  . Active Member of Clubs or Organizations: No  . Attends Archivist Meetings: Never  . Marital Status: Married  Human resources officer Violence: Not At Risk  . Fear of Current or Ex-Partner: No  . Emotionally Abused: No  . Physically Abused: No  . Sexually Abused: No    Family History:    Family History  Problem Relation Age of Onset  . Hypertension Mother   . Hypertension Father   . Arthritis Sister        rheumatoid  . Stroke Sister 68     ROS:  Please see the history of present illness.   All other ROS reviewed and negative.     Physical Exam/Data:   Vitals:   09/30/20 1702 09/30/20 1737 09/30/20  1751 09/30/20 1910  BP: (!) 144/85 (!) 155/87 114/70 124/65  Pulse: 88 91 (!) 112 (!) 101  Resp: 18 18 18 18   Temp:  98.9 F (37.2 C) 98.7 F (37.1 C)  98.7 F (37.1 C)  TempSrc: Oral Oral  Oral  SpO2: 97% 97% 97% 97%  Weight:      Height:        Intake/Output Summary (Last 24 hours) at 09/30/2020 2046 Last data filed at 09/30/2020 1908 Gross per 24 hour  Intake 2227.08 ml  Output 1050 ml  Net 1177.08 ml   Last 3 Weights 09/30/2020 09/07/2020 05/25/2020  Weight (lbs) 193 lb 3 oz 193 lb 3 oz 189 lb 3.2 oz  Weight (kg) 87.629 kg 87.629 kg 85.821 kg     Body mass index is 26.2 kg/m.  General:  Well nourished, well developed, in no acute distress HEENT: normal Neck: no JVD Cardiac:  normal S1, S2; RRR; no murmur  Lungs:  clear to auscultation bilaterally, no wheezing, rhonchi or rales  Abd: soft, nontender, no hepatomegaly  Ext: no edema Musculoskeletal:  No deformities Skin: warm and dry  Neuro:  no focal abnormalities noted Psych:  Normal affect   EKG:  The EKG was personally reviewed and demonstrates:  Sinus tachycardia, no ST changes Telemetry:  Telemetry was personally reviewed and demonstrates:  NSR  Relevant CV Studies:   Laboratory Data:  High Sensitivity Troponin:   Recent Labs  Lab 09/30/20 1800  TROPONINIHS 5     Chemistry Recent Labs  Lab 09/30/20 1026 09/30/20 1800  NA 140 140  K 3.6 3.9  CL 109 108  CO2 21* 20*  GLUCOSE 118* 232*  BUN 12 13  CREATININE 0.82 0.98  CALCIUM 9.1 9.0  GFRNONAA >60 >60  ANIONGAP 10 12    Recent Labs  Lab 09/30/20 1026  PROT 7.4  ALBUMIN 4.6  AST 22  ALT 19  ALKPHOS 94  BILITOT 1.0   Hematology Recent Labs  Lab 09/30/20 1026  WBC 8.9  RBC 5.19  HGB 16.5  HCT 47.7  MCV 91.9  MCH 31.8  MCHC 34.6  RDW 13.3  PLT 206   BNPNo results for input(s): BNP, PROBNP in the last 168 hours.  DDimer No results for input(s): DDIMER in the last 168 hours.   Radiology/Studies:  DG Pelvis Portable  Result  Date: 09/30/2020 CLINICAL DATA:  Post RIGHT anterior hip replacement EXAM: PORTABLE PELVIS 1-2 VIEWS COMPARISON:  Portable exam 1407 hours compared to earlier intraoperative images of 09/30/2020 FINDINGS: BILATERAL hip prostheses identified. No fracture, dislocation, or bone destruction. Small amount retained contrast within sigmoid diverticula in pelvis. IMPRESSION: RIGHT hip prosthesis without acute abnormalities. Electronically Signed   By: Lavonia Dana M.D.   On: 09/30/2020 15:47   DG C-Arm 1-60 Min-No Report  Result Date: 09/30/2020 Fluoroscopy was utilized by the requesting physician.  No radiographic interpretation.   DG HIP OPERATIVE UNILAT WITH PELVIS RIGHT  Result Date: 09/30/2020 CLINICAL DATA:  Total right hip replacement. EXAM: OPERATIVE RIGHT HIP (WITH PELVIS IF PERFORMED) 2 VIEWS TECHNIQUE: Fluoroscopic spot image(s) were submitted for interpretation post-operatively. FINDINGS: Total right hip replacement. Hardware intact. Anatomic alignment. Prior left hip replacement. IMPRESSION: Total right hip replacement with anatomic alignment. Electronically Signed   By: Marcello Moores  Register   On: 09/30/2020 14:55     Assessment and Plan:   Chest pain: History of CAD with PCI to PDA and diagonal.  Reports 2 episodes of chest pain postoperatively, each resolved with sublingual nitroglycerin x1.  Concerning for angina.  EKG without ischemic changes but unclear if was done during chest pain episode. Troponins unremarkable, but may not bump  troponin given short duration of chest pain, as lasted 5 minutes and resolved with NTGx1.   -Start imdur 30 mg daily -SL NTG as needed -Will check echo -He is on Plavix monotherapy but held Plavix for 7 days prior to surgery and has been taking aspirin 81 mg daily during this time.  Continue ASA -Can plan ischemic evaluation after he recovers from his surgery  Elevated lactate: lactate 4.1, unclear cause.  There is no other evidence of hypoperfusion, as he is  normotensive, mentating appropriately, warm on exam, normal kidney function.  Continue to trend.   For questions or updates, please contact [place] Please consult [email or web address] for contact info under    Signed, Donato Heinz, MD  09/30/2020 8:46 PM

## 2020-09-30 NOTE — Progress Notes (Signed)
Patient complaints of chest pain clutching chest vs taken nitro 0.4 mg given d [id number]

## 2020-09-30 NOTE — Plan of Care (Signed)
  Problem: Education: Goal: Knowledge of General Education information will improve Description: Including pain rating scale, medication(s)/side effects and non-pharmacologic comfort measures Outcome: Progressing   Problem: Activity: Goal: Risk for activity intolerance will decrease Outcome: Progressing   Problem: Nutrition: Goal: Adequate nutrition will be maintained Outcome: Progressing   Problem: Elimination: Goal: Will not experience complications related to bowel motility Outcome: Progressing   Problem: Pain Managment: Goal: General experience of comfort will improve Outcome: Progressing   Problem: Skin Integrity: Goal: Risk for impaired skin integrity will decrease Outcome: Progressing   Problem: Education: Goal: Knowledge of the prescribed therapeutic regimen will improve Outcome: Progressing   Problem: Activity: Goal: Ability to avoid complications of mobility impairment will improve Outcome: Progressing   Problem: Pain Management: Goal: Pain level will decrease with appropriate interventions Outcome: Progressing

## 2020-09-30 NOTE — Progress Notes (Signed)
CRITICAL VALUE ALERT  Critical Value: Lactic Acid 4.1   Date & Time Notied:  09/30/20 1930  Provider Notified: Linus Orn, PA/ for Dr Onnie Graham   Orders Received/Actions taken: no orders received  Luisfernando Brightwell, Laurel Dimmer, RN

## 2020-09-30 NOTE — Significant Event (Signed)
Rapid Response Event Note   Reason for Call : chest pain   Initial Focused Assessment: upon arrival, patient is alert and oriented, pleasant in bed. Nursing staff states he started c/o chest pain. Alusio, MD already in room, and aware. Ordered nitro sublingual. Nitro relieved chest pain by the time of RR arrival. Patient complains that pain was at the center of chest, crushing, rating 8/10. Non-radiating. BP 127/77 (91) HR in 90's. RR 14 on RA.       Interventions: IV Morphine had been recently provided at 1707. Nitro relieved chest pain. Aluisio spoke with cardiology who agreed to send him to a telemetry for closer cardiac monitoring. Lactic acid, troponins, PTT/INR sent down for evaluation. While waiting for a telemetry bed, patient began experiencing chest pain again with less intensity rated 5/10. Medicated with nitro by RRT at [place] of Care:  Transfer to 4th floor for cardiac monitoring   Event Summary:   MD Notified: Aluisio 1743 Call Time: [place] End Time: 1911  Clarene Critchley, RN

## 2020-09-30 NOTE — Progress Notes (Signed)
1745 dr Wynelle Link on floor orders received RR called to floor d Hyrum Shaneyfelt RN

## 2020-09-30 NOTE — Transfer of Care (Signed)
Immediate Anesthesia Transfer of Care Note  Patient: Luis Guzman  Procedure(s) Performed: TOTAL HIP ARTHROPLASTY ANTERIOR APPROACH (Right Hip)  Patient Location: PACU  Anesthesia Type:Spinal  Level of Consciousness: awake, alert  and oriented  Airway & Oxygen Therapy: Patient Spontanous Breathing and Patient connected to face mask oxygen  Post-op Assessment: Report given to RN and Post -op Vital signs reviewed and stable  Post vital signs: Reviewed and stable  Last Vitals:  Vitals Value Taken Time  BP    Temp    Pulse 57 09/30/20 1343  Resp 13 09/30/20 1343  SpO2 99 % 09/30/20 1343  Vitals shown include unvalidated device data.  Last Pain:  Vitals:   09/30/20 1044  TempSrc:   PainSc: 0-No pain      Patients Stated Pain Goal: 3 (16/10/96 0454)  Complications: No complications documented.

## 2020-09-30 NOTE — Progress Notes (Signed)
1910 patient transferred to 1411 with Yehuda Budd RN. Lactic  Acid 4.1 called to floor as leaving. Reported to D Dark Rn on [place]

## 2020-09-30 NOTE — Anesthesia Procedure Notes (Signed)
Spinal  Patient location during procedure: OR Start time: 09/30/2020 11:50 AM End time: 09/30/2020 11:57 AM Staffing Performed: anesthesiologist  Anesthesiologist: Suzette Battiest, MD Preanesthetic Checklist Completed: patient identified, IV checked, site marked, risks and benefits discussed, surgical consent, monitors and equipment checked, pre-op evaluation and timeout performed Spinal Block Patient position: sitting Prep: DuraPrep Patient monitoring: heart rate, cardiac monitor, continuous pulse ox and blood pressure Approach: midline Location: L4-5 Injection technique: single-shot Needle Needle type: Pencan  Needle gauge: 24 G Needle length: 9 cm Assessment Sensory level: T4 Additional Notes Difficult spinal due to scoliosis. Multiple attempts by CRNA and myself. Eventually successful SAB by myself with aspiration of CSF before and after injection LA.

## 2020-09-30 NOTE — Anesthesia Postprocedure Evaluation (Signed)
Anesthesia Post Note  Patient: Luis Guzman  Procedure(s) Performed: TOTAL HIP ARTHROPLASTY ANTERIOR APPROACH (Right Hip)     Patient location during evaluation: PACU Anesthesia Type: Spinal Level of consciousness: awake and alert Pain management: pain level controlled Vital Signs Assessment: post-procedure vital signs reviewed and stable Respiratory status: spontaneous breathing and respiratory function stable Cardiovascular status: blood pressure returned to baseline and stable Postop Assessment: spinal receding Anesthetic complications: no   No complications documented.  Last Vitals:  Vitals:   09/30/20 1751 09/30/20 1910  BP: 114/70 124/65  Pulse: (!) 112 (!) 101  Resp: 18 18  Temp:  37.1 C  SpO2: 97% 97%    Last Pain:  Vitals:   09/30/20 1910  TempSrc: Oral  PainSc:                  [name]

## 2020-09-30 NOTE — Interval H&P Note (Signed)
History and Physical Interval Note:  09/30/2020 10:37 AM  Luis Guzman  has presented today for surgery, with the diagnosis of right hip osteoarthritis.  The various methods of treatment have been discussed with the patient and family. After consideration of risks, benefits and other options for treatment, the patient has consented to  Procedure(s) with comments: Green (Right) - 136min as a surgical intervention.  The patient's history has been reviewed, patient examined, no change in status, stable for surgery.  I have reviewed the patient's chart and labs.  Questions were answered to the patient's satisfaction.     Pilar Plate Shanay Woolman

## 2020-09-30 NOTE — Discharge Instructions (Signed)
Gaynelle Arabian, MD Total Joint Specialist [facility] [place] [address], [address] [place], [place] [address] [phone]  ANTERIOR APPROACH TOTAL HIP REPLACEMENT POSTOPERATIVE DIRECTIONS     Hip Rehabilitation, Guidelines Following Surgery  The results of a hip operation are greatly improved after range of motion and muscle strengthening exercises. Follow all safety measures which are given to protect your hip. If any of these exercises cause increased pain or swelling in your joint, decrease the amount until you are comfortable again. Then slowly increase the exercises. Call your caregiver if you have problems or questions.   BLOOD CLOT PREVENTION . Take a 325 mg Aspirin once a day for three weeks following surgery with your usual 75 mg Plavix. Then resume one 81 mg Aspirin once a day with your usual 75 mg Plavix.  Dennis Bast may resume your vitamins/supplements upon discharge from the hospital. . Do not take any NSAIDs (Advil, Aleve, Ibuprofen, Meloxicam, etc.) until you have discontinued the 325 mg Aspirin.  HOME CARE INSTRUCTIONS  . Remove items at home which could result in a fall. This includes throw rugs or furniture in walking pathways.   ICE to the affected hip as frequently as 20-30 minutes an hour and then as needed for pain and swelling. Continue to use ice on the hip for pain and swelling from surgery. You may notice swelling that will progress down to the foot and ankle. This is normal after surgery. Elevate the leg when you are not up walking on it.    Continue to use the breathing machine which will help keep your temperature down.  It is common for your temperature to cycle up and down following surgery, especially at night when you are not up moving around and exerting yourself.  The breathing machine keeps your lungs expanded and your temperature down.  DIET You may resume your previous home diet once your are discharged from the hospital.  DRESSING / WOUND  CARE / SHOWERING . You have an adhesive waterproof bandage over the incision. Leave this in place until your first follow-up appointment. Once you remove this you will not need to place another bandage.  . You may begin showering 3 days following surgery, but do not submerge the incision under water.  ACTIVITY . For the first 3-5 days, it is important to rest and keep the operative leg elevated. You should, as a general rule, rest for 50 minutes and walk/stretch for 10 minutes per hour. After 5 days, you may slowly increase activity as tolerated.  [name] Perform the exercises you were provided twice a day for about 15-20 minutes each session. Begin these 2 days following surgery. . Walk with your walker as instructed. Use the walker until you are comfortable transitioning to a cane. Walk with the cane in the opposite hand of the operative leg. You may discontinue the cane once you are comfortable and walking steadily. . Avoid periods of inactivity such as sitting longer than an hour when not asleep. This helps prevent blood clots.  . Do not drive a car for 6 weeks or until released by your surgeon.  . Do not drive while taking narcotics.  TED HOSE STOCKINGS Wear the elastic stockings on both legs for three weeks following surgery during the day. You may remove them at night while sleeping.  WEIGHT BEARING Weight bearing as tolerated with assist device (walker, cane, etc) as directed, use it as long as suggested by your surgeon or therapist, typically at least 4-6  weeks.  POSTOPERATIVE CONSTIPATION PROTOCOL Constipation - defined medically as fewer than three stools per week and severe constipation as less than one stool per week.  One of the most common issues patients have following surgery is constipation.  Even if you have a regular bowel pattern at home, your normal regimen is likely to be disrupted due to multiple reasons following surgery.  Combination of anesthesia, postoperative narcotics,  change in appetite and fluid intake all can affect your bowels.  In order to avoid complications following surgery, here are some recommendations in order to help you during your recovery period.  . Colace (docusate) - Pick up an over-the-counter form of Colace or another stool softener and take twice a day as long as you are requiring postoperative pain medications.  Take with a full glass of water daily.  If you experience loose stools or diarrhea, hold the colace until you stool forms back up.  If your symptoms do not get better within 1 week or if they get worse, check with your doctor. . Dulcolax (bisacodyl) - Pick up over-the-counter and take as directed by the product packaging as needed to assist with the movement of your bowels.  Take with a full glass of water.  Use this product as needed if not relieved by Colace only.  . MiraLax (polyethylene glycol) - Pick up over-the-counter to have on hand.  MiraLax is a solution that will increase the amount of water in your bowels to assist with bowel movements.  Take as directed and can mix with a glass of water, juice, soda, coffee, or tea.  Take if you go more than two days without a movement.Do not use MiraLax more than once per day. Call your doctor if you are still constipated or irregular after using this medication for 7 days in a row.  If you continue to have problems with postoperative constipation, please contact the office for further assistance and recommendations.  If you experience "the worst abdominal pain ever" or develop nausea or vomiting, please contact the office immediatly for further recommendations for treatment.  ITCHING  If you experience itching with your medications, try taking only a single pain pill, or even half a pain pill at a time.  You can also use Benadryl over the counter for itching or also to help with sleep.   MEDICATIONS See your medication summary on the "After Visit Summary" that the nursing staff will review  with you prior to discharge.  You may have some home medications which will be placed on hold until you complete the course of blood thinner medication.  It is important for you to complete the blood thinner medication as prescribed by your surgeon.  Continue your approved medications as instructed at time of discharge.  PRECAUTIONS If you experience chest pain or shortness of breath - call 911 immediately for transfer to the hospital emergency department.  If you develop a fever greater that 101 F, purulent drainage from wound, increased redness or drainage from wound, foul odor from the wound/dressing, or calf pain - CONTACT YOUR SURGEON.                                                   FOLLOW-UP APPOINTMENTS Make sure you keep all of your appointments after your operation with your surgeon and caregivers. You should call the  office at the above phone number and make an appointment for approximately two weeks after the date of your surgery or on the date instructed by your surgeon outlined in the "After Visit Summary".  RANGE OF MOTION AND STRENGTHENING EXERCISES  These exercises are designed to help you keep full movement of your hip joint. Follow your caregiver's or physical therapist's instructions. Perform all exercises about fifteen times, three times per day or as directed. Exercise both hips, even if you have had only one joint replacement. These exercises can be done on a training (exercise) mat, on the floor, on a table or on a bed. Use whatever works the best and is most comfortable for you. Use music or television while you are exercising so that the exercises are a pleasant break in your day. This will make your life better with the exercises acting as a break in routine you can look forward to.  . Lying on your back, slowly slide your foot toward your buttocks, raising your knee up off the floor. Then slowly slide your foot back down until your leg is straight again.  . Lying on your back  spread your legs as far apart as you can without causing discomfort.  . Lying on your side, raise your upper leg and foot straight up from the floor as far as is comfortable. Slowly lower the leg and repeat.  . Lying on your back, tighten up the muscle in the front of your thigh (quadriceps muscles). You can do this by keeping your leg straight and trying to raise your heel off the floor. This helps strengthen the largest muscle supporting your knee.  . Lying on your back, tighten up the muscles of your buttocks both with the legs straight and with the knee bent at a comfortable angle while keeping your heel on the floor.   IF YOU ARE TRANSFERRED TO A SKILLED REHAB FACILITY If the patient is transferred to a skilled rehab facility following release from the hospital, a list of the current medications will be sent to the facility for the patient to continue.  When discharged from the skilled rehab facility, please have the facility set up the patient's [place] prior to being released. Also, the skilled facility will be responsible for providing the patient with their medications at time of release from the facility to include their pain medication, the muscle relaxants, and their blood thinner medication. If the patient is still at the rehab facility at time of the two week follow up appointment, the skilled rehab facility will also need to assist the patient in arranging follow up appointment in our office and any transportation needs.  MAKE SURE YOU:  . Understand these instructions.  . Get help right away if you are not doing well or get worse.    DENTAL ANTIBIOTICS:  In most cases prophylactic antibiotics for Dental procdeures after total joint surgery are not necessary.  Exceptions are as follows:  1. History of prior total joint infection  2. Severely immunocompromised (Organ Transplant, cancer chemotherapy, Rheumatoid biologic meds such as [place])  3. Poorly  controlled diabetes (A1C &gt; 8.0, blood glucose over 200)  If you have one of these conditions, contact your surgeon for an antibiotic prescription, prior to your dental procedure.    Pick up stool softner and laxative for home use following surgery while on pain medications. Do not submerge incision under water. Please use good hand washing techniques while changing dressing each day. May  shower starting three days after surgery. Please use a clean towel to pat the incision dry following showers. Continue to use ice for pain and swelling after surgery. Do not use any lotions or creams on the incision until instructed by your surgeon.

## 2020-10-01 ENCOUNTER — Observation Stay (HOSPITAL_BASED_OUTPATIENT_CLINIC_OR_DEPARTMENT_OTHER): Payer: Medicare Other

## 2020-10-01 ENCOUNTER — Encounter (HOSPITAL_COMMUNITY): Payer: Self-pay | Admitting: Orthopedic Surgery

## 2020-10-01 DIAGNOSIS — R079 Chest pain, unspecified: Secondary | ICD-10-CM

## 2020-10-01 DIAGNOSIS — Z791 Long term (current) use of non-steroidal anti-inflammatories (NSAID): Secondary | ICD-10-CM | POA: Diagnosis not present

## 2020-10-01 DIAGNOSIS — M1611 Unilateral primary osteoarthritis, right hip: Secondary | ICD-10-CM | POA: Diagnosis not present

## 2020-10-01 DIAGNOSIS — Z79899 Other long term (current) drug therapy: Secondary | ICD-10-CM | POA: Diagnosis not present

## 2020-10-01 DIAGNOSIS — D649 Anemia, unspecified: Secondary | ICD-10-CM | POA: Diagnosis not present

## 2020-10-01 DIAGNOSIS — R0789 Other chest pain: Secondary | ICD-10-CM | POA: Diagnosis not present

## 2020-10-01 DIAGNOSIS — E876 Hypokalemia: Secondary | ICD-10-CM | POA: Diagnosis not present

## 2020-10-01 DIAGNOSIS — I10 Essential (primary) hypertension: Secondary | ICD-10-CM | POA: Diagnosis not present

## 2020-10-01 DIAGNOSIS — Z87891 Personal history of nicotine dependence: Secondary | ICD-10-CM | POA: Diagnosis not present

## 2020-10-01 DIAGNOSIS — E785 Hyperlipidemia, unspecified: Secondary | ICD-10-CM | POA: Diagnosis not present

## 2020-10-01 DIAGNOSIS — E119 Type 2 diabetes mellitus without complications: Secondary | ICD-10-CM | POA: Diagnosis not present

## 2020-10-01 DIAGNOSIS — I2510 Atherosclerotic heart disease of native coronary artery without angina pectoris: Secondary | ICD-10-CM | POA: Diagnosis not present

## 2020-10-01 LAB — ECHOCARDIOGRAM COMPLETE
Area-P 1/2: 3.91 cm2
Calc EF: 64.1 %
Height: 72 in
P 1/2 time: 518 msec
S' Lateral: 2.9 cm
Single Plane A2C EF: 63.3 %
Single Plane A4C EF: 58 %
Weight: 3090.99 oz

## 2020-10-01 LAB — BASIC METABOLIC PANEL
Anion gap: 9 (ref 5–15)
BUN: 15 mg/dL (ref 8–23)
CO2: 22 mmol/L (ref 22–32)
Calcium: 8.5 mg/dL — ABNORMAL LOW (ref 8.9–10.3)
Chloride: 107 mmol/L (ref 98–111)
Creatinine, Ser: 0.92 mg/dL (ref 0.61–1.24)
GFR, Estimated: >60 mL/min (ref >60)
Glucose, Bld: 175 mg/dL — ABNORMAL HIGH (ref 70–99)
Potassium: 3.8 mmol/L (ref 3.5–5.1)
Sodium: 138 mmol/L (ref 135–145)

## 2020-10-01 LAB — CBC
HCT: 36.6 % — ABNORMAL LOW (ref 39.0–52.0)
Hemoglobin: 12.7 g/dL — ABNORMAL LOW (ref 13.0–17.0)
MCH: 32.2 pg (ref 26.0–34.0)
MCHC: 34.7 g/dL (ref 30.0–36.0)
MCV: 92.9 fL (ref 80.0–100.0)
Platelets: 206 10*3/uL (ref 150–400)
RBC: 3.94 MIL/uL — ABNORMAL LOW (ref 4.22–5.81)
RDW: 13.1 % (ref 11.5–15.5)
WBC: 20.9 10*3/uL — ABNORMAL HIGH (ref 4.0–10.5)
nRBC: 0 % (ref 0.0–0.2)

## 2020-10-01 LAB — LACTIC ACID, PLASMA: Lactic Acid, Venous: 2.2 mmol/L (ref 0.5–1.9)

## 2020-10-01 LAB — TROPONIN I (HIGH SENSITIVITY): Troponin I (High Sensitivity): 6 ng/L (ref <18)

## 2020-10-01 MED ORDER — CHLORHEXIDINE GLUCONATE CLOTH 2 % EX PADS
6.0000 | MEDICATED_PAD | Freq: Every day | CUTANEOUS | Status: DC
  Administered 2020-10-01: 6 via TOPICAL

## 2020-10-01 MED ORDER — TRAMADOL HCL 50 MG PO TABS: 50.0000 mg | ORAL_TABLET | Freq: Four times a day (QID) | ORAL | 0 refills | Status: DC | PRN

## 2020-10-01 MED ORDER — METHOCARBAMOL 500 MG PO TABS: 500.0000 mg | ORAL_TABLET | Freq: Four times a day (QID) | ORAL | 0 refills | Status: DC | PRN

## 2020-10-01 MED ORDER — HYDROCODONE-ACETAMINOPHEN 5-325 MG PO TABS: 1.0000 | ORAL_TABLET | Freq: Four times a day (QID) | ORAL | 0 refills | Status: DC | PRN

## 2020-10-01 MED ORDER — ASPIRIN 325 MG PO TBEC: 325.0000 mg | DELAYED_RELEASE_TABLET | Freq: Every day | ORAL | 0 refills | Status: AC

## 2020-10-01 MED ORDER — ISOSORBIDE MONONITRATE ER 30 MG PO TB24: 30.0000 mg | ORAL_TABLET | Freq: Every day | ORAL | 0 refills | Status: DC

## 2020-10-01 NOTE — Evaluation (Signed)
Physical Therapy Evaluation Patient Details Name: Luis Guzman MRN: [id number] DOB: 1945-10-15 Today's Date: 10/01/2020   History of Present Illness  75 y.o. male admitted for R AA-THA 09/30/20. PMH: OA, HTN, HLD, DM, AAA, back surgery, neck surgery, B rotator cuff repairs, L AA THA 03/04/20.  Clinical Impression  Pt has met PT goals and is ready to DC from PT standpoint. He completed gait training, stair training, and demonstrates good understanding of HEP.     Follow Up Recommendations Follow surgeon's recommendation for DC plan and follow-up therapies    Equipment Recommendations  None recommended by PT    Recommendations for Other Services       Precautions / Restrictions Precautions Precautions: Fall Precaution Comments: denies falls in past 6 months Restrictions Weight Bearing Restrictions: No Other Position/Activity Restrictions: WBAT      Mobility  Bed Mobility Overal bed mobility: Modified Independent             General bed mobility comments: used rail, used belt looped around R foot as leg lifter, HOB up    Transfers Overall transfer level: Needs assistance Equipment used: Rolling walker (2 wheeled) Transfers: Sit to/from Stand Sit to Stand: Min guard         General transfer comment: VCs hand placement  Ambulation/Gait Ambulation/Gait assistance: Supervision Gait Distance (Feet): 200 Feet Assistive device: Rolling walker (2 wheeled) Gait Pattern/deviations: Step-through pattern;Decreased stride length Gait velocity: decr   General Gait Details: VCs sequencing just initially, no loss of balance, HR 120 walking  Stairs Stairs: Yes Stairs assistance: Min assist Stair Management: No rails;Step to pattern;Backwards Number of Stairs: 2 General stair comments: wife assisted with stablizing RW, VCs sequencing, 2 stairs backwards x 2 trials, min A just to manage RW  Wheelchair Mobility    Modified Rankin (Stroke Patients Only)       Balance  Overall balance assessment: Modified Independent                                           Pertinent Vitals/Pain Pain Assessment: 0-10 Pain Score: 8  Pain Location: R hip Pain Descriptors / Indicators: Sore Pain Intervention(s): Limited activity within patient's tolerance;Monitored during session;Premedicated before session;Ice applied    Home Living Family/patient expects to be discharged to:: Private residence   Available Help at Discharge: Family;Available 24 hours/day Type of Home: House Home Access: Stairs to enter Entrance Stairs-Rails: None Entrance Stairs-Number of Steps: 2 Home Layout: One level Home Equipment: Walker - 2 wheels;Bedside commode      Prior Function Level of Independence: Independent               Hand Dominance   Dominant Hand: Right    Extremity/Trunk Assessment   Upper Extremity Assessment Upper Extremity Assessment: Overall WFL for tasks assessed    Lower Extremity Assessment Lower Extremity Assessment: RLE deficits/detail RLE Deficits / Details: knee ext at least 3/5, hip flexion AAROM ~45* limited by pain RLE Sensation: WNL RLE Coordination: WNL    Cervical / Trunk Assessment Cervical / Trunk Assessment: Normal  Communication   Communication: HOH  Cognition Arousal/Alertness: Awake/alert Behavior During Therapy: WFL for tasks assessed/performed Overall Cognitive Status: Within Functional Limits for tasks assessed  General Comments      Exercises Total Joint Exercises Ankle Circles/Pumps: AROM;Both;10 reps;Supine Quad Sets: AROM;Right;5 reps;Supine Short Arc Quad: AROM;Right;10 reps;Supine Heel Slides: AAROM;Right;10 reps;Supine Hip ABduction/ADduction: AAROM;Right;10 reps;Supine Long Arc Quad: AROM;Right;10 reps;Seated   Assessment/Plan    PT Assessment Patent does not need any further PT services  PT Problem List         PT Treatment  Interventions      PT Goals (Current goals can be found in the Care Plan section)  Acute Rehab PT Goals Patient Stated Goal: travel, pickleball PT Goal Formulation: All assessment and education complete, DC therapy    Frequency     Barriers to discharge        Co-evaluation               AM-PAC PT "6 Clicks" Mobility  Outcome Measure Help needed turning from your back to your side while in a flat bed without using bedrails?: None Help needed moving from lying on your back to sitting on the side of a flat bed without using bedrails?: A Little Help needed moving to and from a bed to a chair (including a wheelchair)?: None Help needed standing up from a chair using your arms (e.g., wheelchair or bedside chair)?: None Help needed to walk in hospital room?: None Help needed climbing 3-5 steps with a railing? : A Little 6 Click Score: 22    End of Session Equipment Utilized During Treatment: Gait belt Activity Tolerance: Patient tolerated treatment well Patient left: in chair;with call bell/phone within reach;with family/visitor present Nurse Communication: Mobility status      Time: 0102-7253 PT Time Calculation (min) (ACUTE ONLY): 35 min   Charges:   PT Evaluation $PT Eval Low Complexity: 1 Low PT Treatments $Gait Training: 8-22 mins      Blondell Reveal Kistler PT 10/01/2020  Acute Rehabilitation Services Pager [phone] Office [phone]

## 2020-10-01 NOTE — Progress Notes (Signed)
Subjective: 1 Day Post-Op Procedure(s) (LRB): TOTAL HIP ARTHROPLASTY ANTERIOR APPROACH (Right) Patient reports pain as mild.   Patient seen in rounds with Dr. Wynelle Link. Patient is feeling better this AM, resting comfortably in bed. Had one additional episode of chest pain last night, less intensity than the first. No further issues. Troponins negative and EKG within normal limits. Denies any chest pain or issues this AM.  We will begin therapy today.   Objective: Vital signs in last 24 hours: Temp:  [95.7 F (35.4 C)-98.9 F (37.2 C)] 98.4 F (36.9 C) (02/17 0703) Pulse Rate:  [47-112] 79 (02/17 0703) Resp:  [11-18] 16 (02/17 0703) BP: (114-155)/(55-90) 119/64 (02/17 0703) SpO2:  [95 %-100 %] 98 % (02/17 0703) Weight:  [87.6 kg] 87.6 kg (02/16 1044)  Intake/Output from previous day:  Intake/Output Summary (Last 24 hours) at 10/01/2020 0720 Last data filed at 10/01/2020 0500 Gross per 24 hour  Intake 3503.66 ml  Output 1400 ml  Net 2103.66 ml    Labs: Recent Labs    09/30/20 1026 10/01/20 0501  HGB 16.5 12.7*   Recent Labs    09/30/20 1026 10/01/20 0501  WBC 8.9 20.9*  RBC 5.19 3.94*  HCT 47.7 36.6*  PLT 206 206   Recent Labs    09/30/20 1800 10/01/20 0501  NA 140 138  K 3.9 3.8  CL 108 107  CO2 20* 22  BUN 13 15  CREATININE 0.98 0.92  GLUCOSE 232* 175*  CALCIUM 9.0 8.5*   Recent Labs    09/30/20 1026 09/30/20 1803  INR 1.0 1.1    Exam: General - Patient is Alert and Oriented Extremity - Neurologically intact Neurovascular intact Sensation intact distally Dorsiflexion/Plantar flexion intact Dressing - dressing C/D/I Motor Function - intact, moving foot and toes well on exam.   Past Medical History:  Diagnosis Date  . AAA (abdominal aortic aneurysm) ([place])   . Anemia    as infant history of  . CAD S/P PTCA & DES PCI - for Progressive Angina 02/26/2019   Cath-PCI 02/26/2019: EF 55-65%. mCx 65% (FFR 0.82 - Med Rx). D2 85% - [place]  Balloon PTCA (2.0 mm). Ost rPDA - 90% -Resolute Onyx 2.5 x 15 (2.6 mm). --> 06/2019: staged DES PCI ost D1 (RESOLUTE ONYX 2.25 mm x 50 mm (2.6 mm) positioned to avoid the true ostium)  . Elevated PSA   . GERD (gastroesophageal reflux disease)   . History of diabetes mellitus, type II    loss weight under control currently  . History of hiatal hernia    40 yrs ago  . History of pancreatitis    elevated lipase levels   . HYPERLIPIDEMIA 01/26/2009  . HYPERTENSION 01/26/2009  . 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 lower back pain, s/p cervical neck surgery"stiffness" remains  . Transfusion history    infant "anemia"    Assessment/Plan: 1 Day Post-Op Procedure(s) (LRB): TOTAL HIP ARTHROPLASTY ANTERIOR APPROACH (Right) Principal Problem:   OA (osteoarthritis) of hip Active Problems:   Primary osteoarthritis of right hip  Estimated body mass index is 26.2 kg/m as calculated from the following:   Height as of this encounter: 6' (1.829 m).   Weight as of this encounter: 87.6 kg. Up with therapy  DVT Prophylaxis - Aspirin and Plavix Weight bearing as tolerated. Begin therapy.  Plan is to go Home with HEP after hospital stay.  Will require at least two sessions of physical therapy today. Discharge pending PT progress and cardiology  recommendations. If cleared by cards and meeting goals, can discharge later today, otherwise will wait until tomorrow.   Appreciative of cardiology's assistance with Mr. Landrum, [facility] [phone] 10/01/2020, 7:20 AM

## 2020-10-01 NOTE — Progress Notes (Signed)
  Echocardiogram 2D Echocardiogram has been performed.  Luis Guzman 10/01/2020, 11:48 AM

## 2020-10-01 NOTE — Progress Notes (Addendum)
Progress Note  Patient Name: Luis Guzman Date of Encounter: 10/01/2020  [facility] Cardiologist: Glenetta Hew, MD   Subjective   He had a total of two episodes of chest pain last night, both treated with nitro at 1744 and 1850. No further chest pain.   Addendum - He completed PT this morning without chest pain.  Inpatient Medications    Scheduled Meds: . amLODipine  2.5 mg Oral Daily  . aspirin EC  325 mg Oral Daily  . Chlorhexidine Gluconate Cloth  6 each Topical Daily  . clopidogrel  75 mg Oral Daily  . dexamethasone (DECADRON) injection  10 mg Intravenous Once  . docusate sodium  100 mg Oral BID  . isosorbide mononitrate  30 mg Oral Daily  . latanoprost  1 drop Both Eyes QHS  . losartan  100 mg Oral Daily   Continuous Infusions: . sodium chloride 75 mL/hr at 10/01/20 0542  . methocarbamol (ROBAXIN) IV     PRN Meds: acetaminophen, bisacodyl, HYDROcodone-acetaminophen, HYDROcodone-acetaminophen, magnesium citrate, menthol-cetylpyridinium **OR** phenol, methocarbamol **OR** methocarbamol (ROBAXIN) IV, metoCLOPramide **OR** metoCLOPramide (REGLAN) injection, morphine injection, nitroGLYCERIN, ondansetron **OR** ondansetron (ZOFRAN) IV, polyethylene glycol, traMADol, traZODone   Vital Signs    Vitals:   09/30/20 2310 10/01/20 0308 10/01/20 0703 10/01/20 0830  BP: 132/69 114/68 119/64 125/64  Pulse: 93 90 79 82  Resp: 16 14 16 18   Temp: 98.5 F (36.9 C) 98.3 F (36.8 C) 98.4 F (36.9 C) 99.3 F (37.4 C)  TempSrc: Oral Oral Oral Oral  SpO2: 95% 97% 98% 95%  Weight:      Height:        Intake/Output Summary (Last 24 hours) at 10/01/2020 0948 Last data filed at 10/01/2020 0500 Gross per 24 hour  Intake 3503.66 ml  Output 1400 ml  Net 2103.66 ml   Last 3 Weights 09/30/2020 09/07/2020 05/25/2020  Weight (lbs) 193 lb 3 oz 193 lb 3 oz 189 lb 3.2 oz  Weight (kg) 87.629 kg 87.629 kg 85.821 kg      Telemetry    Sinus rhythm HR 80s, with PVCs and episode of  bigeminy - Personally Reviewed  ECG    Sinus rhythm with HR 77 - Personally Reviewed  Physical Exam   GEN: No acute distress.   Neck: No JVD Cardiac: RRR, no murmurs, rubs, or gallops.  Respiratory: Clear to auscultation bilaterally. GI: Soft, nontender, non-distended  MS: No edema; actively exercising right hip Neuro:  Nonfocal  Psych: Normal affect   Labs    High Sensitivity Troponin:   Recent Labs  Lab 09/30/20 1800 09/30/20 2042 10/01/20 0501  TROPONINIHS 5 10 6       Chemistry Recent Labs  Lab 09/30/20 1026 09/30/20 1800 10/01/20 0501  NA 140 140 138  K 3.6 3.9 3.8  CL 109 108 107  CO2 21* 20* 22  GLUCOSE 118* 232* 175*  BUN 12 13 15   CREATININE 0.82 0.98 0.92  CALCIUM 9.1 9.0 8.5*  PROT 7.4  --   --   ALBUMIN 4.6  --   --   AST 22  --   --   ALT 19  --   --   ALKPHOS 94  --   --   BILITOT 1.0  --   --   GFRNONAA >60 >60 >60  ANIONGAP 10 12 9      Hematology Recent Labs  Lab 09/30/20 1026 10/01/20 0501  WBC 8.9 20.9*  RBC 5.19 3.94*  HGB 16.5 12.7*  HCT  47.7 36.6*  MCV 91.9 92.9  MCH 31.8 32.2  MCHC 34.6 34.7  RDW 13.3 13.1  PLT 206 206    BNPNo results for input(s): BNP, PROBNP in the last 168 hours.   DDimer No results for input(s): DDIMER in the last 168 hours.   Radiology    DG Pelvis Portable  Result Date: 09/30/2020 CLINICAL DATA:  Post RIGHT anterior hip replacement EXAM: PORTABLE PELVIS 1-2 VIEWS COMPARISON:  Portable exam 1407 hours compared to earlier intraoperative images of 09/30/2020 FINDINGS: BILATERAL hip prostheses identified. No fracture, dislocation, or bone destruction. Small amount retained contrast within sigmoid diverticula in pelvis. IMPRESSION: RIGHT hip prosthesis without acute abnormalities. Electronically Signed   By: Lavonia Dana M.D.   On: 09/30/2020 15:47   DG C-Arm 1-60 Min-No Report  Result Date: 09/30/2020 Fluoroscopy was utilized by the requesting physician.  No radiographic interpretation.   DG HIP  OPERATIVE UNILAT WITH PELVIS RIGHT  Result Date: 09/30/2020 CLINICAL DATA:  Total right hip replacement. EXAM: OPERATIVE RIGHT HIP (WITH PELVIS IF PERFORMED) 2 VIEWS TECHNIQUE: Fluoroscopic spot image(s) were submitted for interpretation post-operatively. FINDINGS: Total right hip replacement. Hardware intact. Anatomic alignment. Prior left hip replacement. IMPRESSION: Total right hip replacement with anatomic alignment. Electronically Signed   By: Marcello Moores  Register   On: 09/30/2020 14:55    Cardiac Studies   Echo scheduled today   Coronary stent intervention 06/2019:  1st Diag lesion is 95% stenosed at location previously treated scoring balloon angioplasty.  A drug-eluting stent was successfully placed-intentionally avoiding the ostium-using a STENT RESOLUTE ONYX 2.25X15. Postdilated to 2.6 mm in the mid stent  Post intervention, there is a 0% residual stenosis.  -------------------------------------  Previously placed RPDA drug eluting stent is widely patent.  Mid Cx lesion is 65% stenosed. Stable.  Prox RCA lesion is 40% stenosed. Mid RCA lesion is 45% stenosed.  Normal LVEDP   SUMMARY  Three-vessel CAD with widely patent PDA stent, stable mid LCx lesion with severe restenosis of the ostial-proximal 1st Diag as the culprit lesion.  Successful DES PCI of 1st Diag carefully missed in the ostium to avoid impinging on the LAD--resolute Onyx DES 2.25 mm a 50 mm (2.6 mm)  Normal LVEDP  RECOMMENDATIONS  We will plan for same-day discharge today after bedrest post Mynx closure device.  For now we will continue current medication regimen, will consider discontinuing Imdur and outpatient follow-up.     Left heart cath 02/2019:  The left ventricular systolic function is normal. The left ventricular ejection fraction is 55-65% by visual estimate.  LV end diastolic pressure is normal.  Lesion #1: Mid Cx lesion is 65% stenosed. -DFR 0.92, FFR 0.82. Not physiologically  significant  Lesion #2: 1st Diag lesion is 85% stenosed.  Scoring balloon angioplasty was performed using a BALLOON WOLVERINE 2.00X10.  Post intervention, there is a 20% residual stenosis.  Lesion #3: RPDA lesion is 90% stenosed.  A drug-eluting stent was successfully placed using a STENT RESOLUTE ONYX 2.5X15. -Postdilated 2.6 mm  Post intervention, there is a 0% residual stenosis.   SUMMARY  Severe two-3-vessel CAD involving 85% ostial 1st Diag & 90% ostial rPDA and borderline mid LCx 65% lesion (DFR 0.92, FFR 0.82--not physiologically significant) ? Successful scoring balloon angioplasty of ostial 1st Diag -reducing lesion to 20% ? Scoring balloon Angioplasty and DES PCI of ostial rPDA -Resolute Onyx DES 2.5 mm x 15 mm (2.6 mm)  Normal LVEF and EDP.   RECOMMENDATIONS  With 2 site PCI and PTCA  only of the diagonal, would like to monitor overnight to assure no complications post PTCA.  This will also allow [place] to monitor his blood pressure and potentially adjust antianginals.  DC amlodipine and increase back up to 50 mg of Toprol along with restarting HCTZ  Would start Imdur 30 mg on discharge  Cardiac rehab consult    Patient Profile     75 y.o. male with a hx of CAD, abdominal aortic aneurysm, T2DM, hypertension, hyperlipidemia who is being seen for the evaluation of chest pain following hip surgery.  Assessment & Plan    Chest pain - hx of PCI to PDA and D1 - chest pain post-op concerning for angina, nitro responsive - hs troponin x 3 negative - EKG nonischemic - he had another episode of chest pain last evening, did not  - pending echo today - if echo normal, will plan for ischemic evaluation after he has recovered from hip surgery   Dual antiplatelet therapy - Dr. Ellyn Hack D/C'ed ASA for plavix monotherapy last fall, restarted ASA 81 mg while he was holding plavix for surgery - per ortho: 325 mg ASA + plavix x 3 weeks, then 81 mg ASA + plavix x 3 weeks, then  stop ASA, continue plavix monotherapy   Elevated lactic acid  - 4.1, unclear source - trending down, now 2.2   Hip replacement - will begin PT today - monitor CP with PT - possible discharge later today if he does well with PT     I will arrange for cardiology follow up.   For questions or updates, please contact [place] Please consult [email or web address] for contact info under        Signed, Ledora Bottcher, PA  10/01/2020, 9:48 AM

## 2020-10-01 NOTE — Progress Notes (Signed)
Patient discharged home with wife, discharge instructions given and explained to patient, he verbalized understanding, denies any distress. Surgical dressing clean/dry/intact. Patient accompanied home by wife.

## 2020-10-05 NOTE — Discharge Summary (Signed)
Physician Discharge Summary   Patient ID: ANTERRIO MCCLEERY MRN: [id number] DOB/AGE: 11-29-1945 75 y.o.  Admit date: 09/30/2020 Discharge date: 10/01/2020  Primary Diagnosis: Osteoarthritis, right hip   Admission Diagnoses:  Past Medical History:  Diagnosis Date  . AAA (abdominal aortic aneurysm) ([place])   . Anemia    as infant history of  . CAD S/P PTCA & DES PCI - for Progressive Angina 02/26/2019   Cath-PCI 02/26/2019: EF 55-65%. mCx 65% (FFR 0.82 - Med Rx). D2 85% - Wolverine Scoring Balloon PTCA (2.0 mm). Ost rPDA - 90% -Resolute Onyx 2.5 x 15 (2.6 mm). --> 06/2019: staged DES PCI ost D1 (RESOLUTE ONYX 2.25 mm x 50 mm (2.6 mm) positioned to avoid the true ostium)  . Elevated PSA   . GERD (gastroesophageal reflux disease)   . History of diabetes mellitus, type II    loss weight under control currently  . History of hiatal hernia    40 yrs ago  . History of pancreatitis    elevated lipase levels   . HYPERLIPIDEMIA 01/26/2009  . HYPERTENSION 01/26/2009  . 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 lower back pain, s/p cervical neck surgery"stiffness" remains  . Transfusion history    infant "anemia"   Discharge Diagnoses:   Principal Problem:   OA (osteoarthritis) of hip Active Problems:   Primary osteoarthritis of right hip   Chest pain of uncertain etiology  Estimated body mass index is 26.2 kg/m as calculated from the following:   Height as of this encounter: 6' (1.829 m).   Weight as of this encounter: 87.6 kg.  Procedure:  Procedure(s) (LRB): TOTAL HIP ARTHROPLASTY ANTERIOR APPROACH (Right)   Consults: cardiology  HPI: Luis Guzman is a 75 y.o. male who has advanced end-  stage arthritis of their Right  hip with progressively worsening pain and  dysfunction.The patient has failed nonoperative management and presents for  total hip arthroplasty.   Laboratory Data: Admission on 09/30/2020, Discharged on 10/01/2020  Component Date Value Ref Range  Status  . ABO/RH(D) 09/30/2020 O POS   Final  . Antibody Screen 09/30/2020 NEG   Final  . Sample Expiration 09/30/2020    Final                   Value:10/03/2020,2359 Performed at [facility], [place] [address], [place], [place] [address]   . WBC 09/30/2020 8.9  4.0 - 10.5 K/uL Final  . RBC 09/30/2020 5.19  4.22 - 5.81 MIL/uL Final  . Hemoglobin 09/30/2020 16.5  13.0 - 17.0 g/dL Final  . HCT 09/30/2020 47.7  39.0 - 52.0 % Final  . MCV 09/30/2020 91.9  80.0 - 100.0 fL Final  . MCH 09/30/2020 31.8  26.0 - 34.0 pg Final  . MCHC 09/30/2020 34.6  30.0 - 36.0 g/dL Final  . RDW 09/30/2020 13.3  11.5 - 15.5 % Final  . Platelets 09/30/2020 206  150 - 400 K/uL Final  . nRBC 09/30/2020 0.0  0.0 - 0.2 % Final   Performed at [facility], [place] [address], [place], [place] [address]  . Sodium 09/30/2020 140  135 - 145 mmol/L Final  . Potassium 09/30/2020 3.6  3.5 - 5.1 mmol/L Final  . Chloride 09/30/2020 109  98 - 111 mmol/L Final  . CO2 09/30/2020 21* 22 - 32 mmol/L Final  . Glucose, Bld 09/30/2020 118* 70 - 99 mg/dL Final   Glucose reference range applies only to samples taken after fasting  for at least 8 hours.  . BUN 09/30/2020 12  8 - 23 mg/dL Final  . Creatinine, Ser 09/30/2020 0.82  0.61 - 1.24 mg/dL Final  . Calcium 09/30/2020 9.1  8.9 - 10.3 mg/dL Final  . Total Protein 09/30/2020 7.4  6.5 - 8.1 g/dL Final  . Albumin 09/30/2020 4.6  3.5 - 5.0 g/dL Final  . AST 09/30/2020 22  15 - 41 U/L Final  . ALT 09/30/2020 19  0 - 44 U/L Final  . Alkaline Phosphatase 09/30/2020 94  38 - 126 U/L Final  . Total Bilirubin 09/30/2020 1.0  0.3 - 1.2 mg/dL Final  . GFR, Estimated 09/30/2020 >60  >60 mL/min Final   Comment: (NOTE) Calculated using the CKD-EPI Creatinine Equation (2021)   . Anion gap 09/30/2020 10  5 - 15 Final   Performed at [facility], [place] [address], [place], [place] [address]  . Prothrombin Time 09/30/2020 13.1  11.4 - 15.2  seconds Final  . INR 09/30/2020 1.0  0.8 - 1.2 Final   Comment: (NOTE) INR goal varies based on device and disease states. Performed at [facility], [place] [address] [place], [place] [address]   . aPTT 09/30/2020 32  24 - 36 seconds Final   Performed at [facility], [place] [address] [place], [place] [address]  . WBC 10/01/2020 20.9* 4.0 - 10.5 K/uL Final  . RBC 10/01/2020 3.94* 4.22 - 5.81 MIL/uL Final  . Hemoglobin 10/01/2020 12.7* 13.0 - 17.0 g/dL Final  . HCT 10/01/2020 36.6* 39.0 - 52.0 % Final  . MCV 10/01/2020 92.9  80.0 - 100.0 fL Final  . MCH 10/01/2020 32.2  26.0 - 34.0 pg Final  . MCHC 10/01/2020 34.7  30.0 - 36.0 g/dL Final  . RDW 10/01/2020 13.1  11.5 - 15.5 % Final  . Platelets 10/01/2020 206  150 - 400 K/uL Final  . nRBC 10/01/2020 0.0  0.0 - 0.2 % Final   Performed at [facility], [place] [address] [place],  [address]  . Sodium 10/01/2020 138  135 - 145 mmol/L Final  . Potassium 10/01/2020 3.8  3.5 - 5.1 mmol/L Final  . Chloride 10/01/2020 107  98 - 111 mmol/L Final  . CO2 10/01/2020 22  22 - 32 mmol/L Final  . Glucose, Bld 10/01/2020 175* 70 - 99 mg/dL Final   Glucose reference range applies only to samples taken after fasting for at least 8 hours.  . BUN 10/01/2020 15  8 - 23 mg/dL Final  . Creatinine, Ser 10/01/2020 0.92  0.61 - 1.24 mg/dL Final  . Calcium 10/01/2020 8.5* 8.9 - 10.3 mg/dL Final  . GFR, Estimated 10/01/2020 >60  >60 mL/min Final   Comment: (NOTE) Calculated using the CKD-EPI Creatinine Equation (2021)   . Anion gap 10/01/2020 9  5 - 15 Final   Performed at [facility], [place] [address] [place],  [address]  . Troponin I (High Sensitivity) 09/30/2020 5  <18 ng/L Final   Comment: (NOTE) Elevated high sensitivity troponin I (hsTnI) values and significant  changes across serial measurements may suggest ACS but many other  chronic and acute conditions are known  to elevate hsTnI results.  Refer to the "Links" section for chest pain algorithms and additional  guidance. Performed at [facility], [place] [address], [place],  [address]   . Sodium 09/30/2020 140  135 - 145 mmol/L Final  . Potassium 09/30/2020 3.9  3.5 -  5.1 mmol/L Final  . Chloride 09/30/2020 108  98 - 111 mmol/L Final  . CO2 09/30/2020 20* 22 - 32 mmol/L Final  . Glucose, Bld 09/30/2020 232* 70 - 99 mg/dL Final   Glucose reference range applies only to samples taken after fasting for at least 8 hours.  . BUN 09/30/2020 13  8 - 23 mg/dL Final  . Creatinine, Ser 09/30/2020 0.98  0.61 - 1.24 mg/dL Final  . Calcium 09/30/2020 9.0  8.9 - 10.3 mg/dL Final  . GFR, Estimated 09/30/2020 >60  >60 mL/min Final   Comment: (NOTE) Calculated using the CKD-EPI Creatinine Equation (2021)   . Anion gap 09/30/2020 12  5 - 15 Final   Performed at [facility] [place] [address] [place], [place] [address]  . Magnesium 09/30/2020 1.9  1.7 - 2.4 mg/dL Final   Performed at [place] [address] [place], [place] [address]  . Phosphorus 09/30/2020 1.6* 2.5 - 4.6 mg/dL Final   Performed at [place] [address] [place], [place] [address]  . aPTT 09/30/2020 31  24 - 36 seconds Final   Performed at [facility], [place] [address], [place], [place] [address]  . Prothrombin Time 09/30/2020 14.0  11.4 - 15.2 seconds Final  . INR 09/30/2020 1.1  0.8 - 1.2 Final   Comment: (NOTE) INR goal varies based on device and disease states. Performed at [facility], [place] [address], [place], [place] [address]   . Lactic Acid, Venous 09/30/2020 4.1* 0.5 - 1.9 mmol/L Final   Comment: CRITICAL RESULT CALLED TO, READ BACK BY AND VERIFIED WITH: J.TAYLOR AT 1856 ON 02.16.22 BY N.THOMPSON Performed at [facility], [place] [address] [place], [place] [address]   . Lactic Acid,  Venous 09/30/2020 3.7* 0.5 - 1.9 mmol/L Final   Comment: CRITICAL VALUE NOTED.  VALUE IS CONSISTENT WITH PREVIOUSLY REPORTED AND CALLED VALUE. Performed at [facility], [place] [address] [place], [place] [address]   . Troponin I (High Sensitivity) 09/30/2020 10  <18 ng/L Final   Comment: (NOTE) Elevated high sensitivity troponin I (hsTnI) values and significant  changes across serial measurements may suggest ACS but many other  chronic and acute conditions are known to elevate hsTnI results.  Refer to the "Links" section for chest pain algorithms and additional  guidance. Performed at [facility], [place] [address], [place], [place] [address]   . Weight 10/01/2020 3,090.99  oz Final  . Height 10/01/2020 72  in Final  . BP 10/01/2020 125/64  mmHg Final  . Single Plane A2C EF 10/01/2020 63.3  % Final  . Single Plane A4C EF 10/01/2020 58.0  % Final  . Calc EF 10/01/2020 64.1  % Final  . S' Lateral 10/01/2020 2.90  cm Final  . P 1/2 time 10/01/2020 518  msec Final  . Area-P 1/2 10/01/2020 3.91  cm2 Final  . Lactic Acid, Venous 10/01/2020 2.2* 0.5 - 1.9 mmol/L Final   Comment: CRITICAL VALUE NOTED.  VALUE IS CONSISTENT WITH PREVIOUSLY REPORTED AND CALLED VALUE. Performed at [facility], [place] [address], [place], [place] [address]   . Troponin I (High Sensitivity) 10/01/2020 6  <18 ng/L Final   Comment: (NOTE) Elevated high sensitivity troponin I (hsTnI) values and significant  changes across serial measurements may suggest ACS but many other  chronic and acute conditions are known to elevate hsTnI results.  Refer to the "  Links" section for chest pain algorithms and additional  guidance. Performed at [facility], [place] [address] [place], [place] [address]   Hospital Outpatient Visit on 09/26/2020  Component Date Value Ref Range Status  . SARS Coronavirus 2 09/26/2020 NEGATIVE  NEGATIVE Final   Comment:  (NOTE) SARS-CoV-2 target nucleic acids are NOT DETECTED.  The SARS-CoV-2 RNA is generally detectable in upper and lower respiratory specimens during the acute phase of infection. Negative results do not preclude SARS-CoV-2 infection, do not rule out co-infections with other pathogens, and should not be used as the sole basis for treatment or other patient management decisions. Negative results must be combined with clinical observations, patient history, and epidemiological information. The expected result is Negative.  Fact Sheet for Patients: SugarRoll.be  Fact Sheet for Healthcare Providers: https://www.woods-mathews.com/  This test is not yet approved or cleared by the [place] FDA and  has been authorized for detection and/or diagnosis of SARS-CoV-2 by FDA under an Emergency Use Authorization (EUA). This EUA will remain  in effect (meaning this test can be used) for the duration of the COVID-19 declaration under Se                          ction 564(b)(1) of the Act, 21 U.S.C. section 360bbb-3(b)(1), unless the authorization is terminated or revoked sooner.  Performed at [facility], [place] [address], [place], [place] [address]   Hospital Outpatient Visit on 09/07/2020  Component Date Value Ref Range Status  . MRSA, PCR 09/07/2020 NEGATIVE  NEGATIVE Final  . Staphylococcus aureus 09/07/2020 NEGATIVE  NEGATIVE Final   Comment: (NOTE) The Xpert SA Assay (FDA approved for NASAL specimens in patients 56 years of age and older), is one component of a comprehensive surveillance program. It is not intended to diagnose infection nor to guide or monitor treatment. Performed at [facility], [place] [address] [place], [place] [address]   . WBC 09/07/2020 8.3  4.0 - 10.5 K/uL Final  . RBC 09/07/2020 5.01  4.22 - 5.81 MIL/uL Final  . Hemoglobin 09/07/2020 15.8  13.0 - 17.0 g/dL Final  . HCT 09/07/2020 46.8  39.0  - 52.0 % Final  . MCV 09/07/2020 93.4  80.0 - 100.0 fL Final  . MCH 09/07/2020 31.5  26.0 - 34.0 pg Final  . MCHC 09/07/2020 33.8  30.0 - 36.0 g/dL Final  . RDW 09/07/2020 12.9  11.5 - 15.5 % Final  . Platelets 09/07/2020 221  150 - 400 K/uL Final  . nRBC 09/07/2020 0.0  0.0 - 0.2 % Final   Performed at [facility], [place] [address], [place], [place] [address]  . Sodium 09/07/2020 141  135 - 145 mmol/L Final  . Potassium 09/07/2020 4.1  3.5 - 5.1 mmol/L Final  . Chloride 09/07/2020 108  98 - 111 mmol/L Final  . CO2 09/07/2020 22  22 - 32 mmol/L Final  . Glucose, Bld 09/07/2020 136* 70 - 99 mg/dL Final   Glucose reference range applies only to samples taken after fasting for at least 8 hours.  . BUN 09/07/2020 12  8 - 23 mg/dL Final  . Creatinine, Ser 09/07/2020 0.95  0.61 - 1.24 mg/dL Final  . Calcium 09/07/2020 9.4  8.9 - 10.3 mg/dL Final  . Total Protein 09/07/2020 6.7  6.5 - 8.1 g/dL Final  . Albumin 09/07/2020 4.1  3.5 - 5.0 g/dL Final  . AST 09/07/2020 25  15 - 41 U/L Final  . ALT 09/07/2020 21  0 - 44 U/L Final  . Alkaline Phosphatase 09/07/2020 75  38 - 126 U/L Final  . Total Bilirubin 09/07/2020 0.6  0.3 - 1.2 mg/dL Final  . GFR, Estimated 09/07/2020 >60  >60 mL/min Final   Comment: (NOTE) Calculated using the CKD-EPI Creatinine Equation (2021)   . Anion gap 09/07/2020 11  5 - 15 Final   Performed at [facility], [place] [address], [place], [place] [address]  . Prothrombin Time 09/07/2020 13.2  11.4 - 15.2 seconds Final  . INR 09/07/2020 1.0  0.8 - 1.2 Final   Comment: (NOTE) INR goal varies based on device and disease states. Performed at [facility], [place] [address], [place], [place] [address]   . aPTT 09/07/2020 33  24 - 36 seconds Final   Performed at [facility], [place] [address] [place], [place] [address]  . ABO/RH(D) 09/07/2020 O POS   Final  . Antibody Screen 09/07/2020 NEG   Final  .  Sample Expiration 09/07/2020 09/19/2020,2359   Final  . Extend sample reason 09/07/2020    Final                   Value:NO TRANSFUSIONS OR PREGNANCY IN THE PAST 3 MONTHS Performed at [facility], [place] [address], [place], [place] [address]   Orders Only on 08/28/2020  Component Date Value Ref Range Status  . Glucose 09/02/2020 90  65 - 99 mg/dL Final  . BUN 09/02/2020 11  8 - 27 mg/dL Final  . Creatinine, Ser 09/02/2020 1.09  0.76 - 1.27 mg/dL Final  . GFR calc non Af Amer 09/02/2020 67  >59 mL/min/1.73 Final  . GFR calc Af Amer 09/02/2020 77  >59 mL/min/1.73 Final   Comment: **In accordance with recommendations from the NKF-ASN Task force,**   [facility] is in the process of updating its eGFR calculation to the   2021 CKD-EPI creatinine equation that estimates kidney function   without a race variable.   . BUN/Creatinine Ratio 09/02/2020 10  10 - 24 Final  . Sodium 09/02/2020 144  134 - 144 mmol/L Final  . Potassium 09/02/2020 4.7  3.5 - 5.2 mmol/L Final  . Chloride 09/02/2020 109* 96 - 106 mmol/L Final  . CO2 09/02/2020 21  20 - 29 mmol/L Final  . Calcium 09/02/2020 9.3  8.6 - 10.2 mg/dL Final     X-Rays:CT ABDOMEN PELVIS W CONTRAST  Result Date: 09/23/2020 CLINICAL DATA:  Epigastric pain, diarrhea, gas, for years, history of appendectomy and cholecystectomy EXAM: CT ABDOMEN AND PELVIS WITH CONTRAST TECHNIQUE: Multidetector CT imaging of the abdomen and pelvis was performed using the standard protocol following bolus administration of intravenous contrast. CONTRAST:  163m ISOVUE-300 IOPAMIDOL (ISOVUE-300) INJECTION 61%, additional oral enteric contrast COMPARISON:  03/12/2014 FINDINGS: Lower chest: No acute abnormality. Hepatobiliary: No focal liver abnormality is seen. Status post cholecystectomy. No biliary dilatation. Pancreas: Unremarkable. No pancreatic ductal dilatation or surrounding inflammatory changes. Spleen: Normal in size without significant abnormality.  Adrenals/Urinary Tract: Adrenal glands are unremarkable. Kidneys are normal, without renal calculi, solid lesion, or hydronephrosis. Bladder is unremarkable. Stomach/Bowel: Stomach is within normal limits. Appendix is surgically absent. No evidence of bowel wall thickening, distention, or inflammatory changes. Descending and sigmoid diverticulosis. Vascular/Lymphatic: Aortic atherosclerosis. No enlarged abdominal or pelvic lymph nodes. Reproductive: No mass or other significant abnormality. Other: Small bilateral fat containing inguinal hernias. No abdominopelvic ascites. Musculoskeletal: No  acute or significant osseous findings. Status post left hip total arthroplasty. IMPRESSION: 1. No acute CT findings of the abdomen or pelvis to explain epigastric pain or gas. 2. Descending and sigmoid diverticulosis without evidence of acute diverticulitis. 3. Status post cholecystectomy and appendectomy. Aortic Atherosclerosis (ICD10-I70.0). Electronically Signed   By: Eddie Candle M.D.   On: 09/23/2020 09:08   DG Pelvis Portable  Result Date: 09/30/2020 CLINICAL DATA:  Post RIGHT anterior hip replacement EXAM: PORTABLE PELVIS 1-2 VIEWS COMPARISON:  Portable exam 1407 hours compared to earlier intraoperative images of 09/30/2020 FINDINGS: BILATERAL hip prostheses identified. No fracture, dislocation, or bone destruction. Small amount retained contrast within sigmoid diverticula in pelvis. IMPRESSION: RIGHT hip prosthesis without acute abnormalities. Electronically Signed   By: Lavonia Dana M.D.   On: 09/30/2020 15:47   DG C-Arm 1-60 Min-No Report  Result Date: 09/30/2020 Fluoroscopy was utilized by the requesting physician.  No radiographic interpretation.   ECHOCARDIOGRAM COMPLETE  Result Date: 10/01/2020    ECHOCARDIOGRAM REPORT   Patient Name:   Luis Guzman Date of Exam: 10/01/2020 Medical Rec #:  [id number]   Height:       72.0 in Accession #:    [id number]  Weight:       193.2 lb Date of Birth:  May 13, 1946   BSA:           2.099 m Patient Age:    55 years    BP:           119/64 mmHg Patient Gender: M           HR:           83 bpm. Exam Location:  Inpatient Procedure: 2D Echo, 3D Echo, Cardiac Doppler and Color Doppler Indications:    R07.9* Chest pain, unspecified  History:        Patient has prior history of Echocardiogram examinations, most                 recent 02/14/2020. CAD, Signs/Symptoms:Dyspnea, Shortness of                 Breath and Chest Pain; Risk Factors:Hypertension and                 Dyslipidemia.  Sonographer:    Roseanna Rainbow RDCS Referring Phys: [id number] [place]  1. Left ventricular ejection fraction, by estimation, is 55 to 60%. The left ventricle has normal function. The left ventricle has no regional wall motion abnormalities. There is mild concentric left ventricular hypertrophy. Left ventricular diastolic parameters were normal.  2. Right ventricular systolic function is normal. The right ventricular size is normal.  3. The mitral valve is normal in structure. Trivial mitral valve regurgitation. No evidence of mitral stenosis.  4. The aortic valve is tricuspid. Aortic valve regurgitation is mild. No aortic stenosis is present. Comparison(s): No significant change from prior study. FINDINGS  Left Ventricle: Left ventricular ejection fraction, by estimation, is 55 to 60%. The left ventricle has normal function. The left ventricle has no regional wall motion abnormalities. The left ventricular internal cavity size was normal in size. There is  mild concentric left ventricular hypertrophy. Left ventricular diastolic parameters were normal. Right Ventricle: The right ventricular size is normal. Right vetricular wall thickness was not well visualized. Right ventricular systolic function is normal. Left Atrium: Left atrial size was normal in size. Right Atrium: Right atrial size was normal in size. Pericardium: There is no evidence of pericardial effusion. Presence  of pericardial fat  pad. Mitral Valve: The mitral valve is normal in structure. Trivial mitral valve regurgitation. No evidence of mitral valve stenosis. Tricuspid Valve: The tricuspid valve is normal in structure. Tricuspid valve regurgitation is trivial. No evidence of tricuspid stenosis. Aortic Valve: The aortic valve is tricuspid. Aortic valve regurgitation is mild. Aortic regurgitation PHT measures 518 msec. No aortic stenosis is present. Pulmonic Valve: The pulmonic valve was not well visualized. Pulmonic valve regurgitation is trivial. Aorta: The aortic root and ascending aorta are structurally normal, with no evidence of dilitation and the aortic arch was not well visualized. Venous: The inferior vena cava was not well visualized. IAS/Shunts: The atrial septum is grossly normal.  LEFT VENTRICLE PLAX 2D LVIDd:         4.30 cm     Diastology LVIDs:         2.90 cm     LV e' medial:    6.53 cm/s LV PW:         1.40 cm     LV E/e' medial:  8.4 LV IVS:        1.40 cm     LV e' lateral:   10.00 cm/s LVOT diam:     1.90 cm     LV E/e' lateral: 5.5 LV SV:         53 LV SV Index:   25 LVOT Area:     2.84 cm  LV Volumes (MOD) LV vol d, MOD A2C: 71.4 ml LV vol d, MOD A4C: 60.9 ml LV vol s, MOD A2C: 26.2 ml LV vol s, MOD A4C: 25.6 ml LV SV MOD A2C:     45.2 ml LV SV MOD A4C:     60.9 ml LV SV MOD BP:      48.7 ml RIGHT VENTRICLE             IVC RV S prime:     23.60 cm/s  IVC diam: 2.10 cm TAPSE (M-mode): 2.8 cm LEFT ATRIUM           Index       RIGHT ATRIUM           Index LA diam:      4.00 cm 1.91 cm/m  RA Area:     12.70 cm LA Vol (A2C): 30.7 ml 14.62 ml/m RA Volume:   24.80 ml  11.81 ml/m LA Vol (A4C): 45.8 ml 21.82 ml/m  AORTIC VALVE LVOT Vmax:   102.00 cm/s LVOT Vmean:  71.200 cm/s LVOT VTI:    0.187 m AI PHT:      518 msec  AORTA Ao Root diam: 3.60 cm Ao Asc diam:  3.20 cm MITRAL VALVE MV Area (PHT): 3.91 cm    SHUNTS MV Decel Time: 194 msec    Systemic VTI:  0.19 m MV E velocity: 54.80 cm/s  Systemic Diam: 1.90 cm MV A  velocity: 70.70 cm/s MV E/A ratio:  0.78 Buford Dresser MD Electronically signed by Buford Dresser MD Signature Date/Time: 10/01/2020/5:56:18 PM    Final    DG HIP OPERATIVE UNILAT WITH PELVIS RIGHT  Result Date: 09/30/2020 CLINICAL DATA:  Total right hip replacement. EXAM: OPERATIVE RIGHT HIP (WITH PELVIS IF PERFORMED) 2 VIEWS TECHNIQUE: Fluoroscopic spot image(s) were submitted for interpretation post-operatively. FINDINGS: Total right hip replacement. Hardware intact. Anatomic alignment. Prior left hip replacement. IMPRESSION: Total right hip replacement with anatomic alignment. Electronically Signed   By: Marcello Moores  Register   On: 09/30/2020 14:55  EKG: Orders placed or performed during the hospital encounter of 09/30/20  . EKG 12-Lead  . EKG 12-Lead  . EKG 12-Lead  . EKG 12-Lead  . EKG 12-Lead  . EKG 12-Lead     Hospital Course: Luis Guzman is a 75 y.o. who was admitted to [facility]. They were brought to the operating room on 09/30/2020 and underwent Procedure(s): [place].  Patient tolerated the procedure well and was later transferred to the recovery room and then to the orthopaedic floor for postoperative care. They were given PO and IV analgesics for pain control following their surgery. They were given 24 hours of postoperative antibiotics of  Anti-infectives (From admission, onward)   Start     Dose/Rate Route Frequency Ordered Stop   09/30/20 1800  ceFAZolin (ANCEF) IVPB 2g/100 mL premix        2 g 200 mL/hr over 30 Minutes Intravenous Every 6 hours 09/30/20 1519 10/01/20 0003   09/30/20 1030  ceFAZolin (ANCEF) IVPB 2g/100 mL premix        2 g 200 mL/hr over 30 Minutes Intravenous On call to O.R. 09/30/20 1020 09/30/20 1200     and started on DVT prophylaxis in the form of Aspirin and Plavix.   PT and OT were ordered for total joint protocol. Discharge planning consulted to help with postop disposition and equipment needs.   Patient was complaining of 8/10 substernal chest pain that developed a few minutes earlier. It was not radiating but was crushing in nature. Nursing had already called rapid response and he was given a sublingual NTG and 81 mg Aspirin. He responded well to the NTG with significant decrease in pain. Vitals stable with tachycardia to 100. EKG with sinus tach but no acute ischemia. Cardiology was called for a consult given his history of CAD with 2 stents. He was moved to telemetry and was seen by cardiology that evening. Cardiac workup was negative, with echo showing no changes and serial troponins within normal limits. He was cleared by cards with plans to schedule an outpatient follow-up appointment. The hip was feeling great on POD #1 during rounds and he was ready to go home. Worked with therapy and was meeting his goals, he was discharged later that day in stable condition.   Diet: Cardiac diet Activity: WBAT Follow-up: in 2 weeks Disposition: Home with HEP Discharged Condition: stable   Discharge Instructions    Call MD / Call 911   Complete by: As directed    If you experience chest pain or shortness of breath, CALL 911 and be transported to the hospital emergency room.  If you develope a fever above 101 F, pus (white drainage) or increased drainage or redness at the wound, or calf pain, call your surgeon's office.   Change dressing   Complete by: As directed    You have an adhesive waterproof bandage over the incision. Leave this in place until your first follow-up appointment. Once you remove this you will not need to place another bandage.   Constipation Prevention   Complete by: As directed    Drink plenty of fluids.  Prune juice may be helpful.  You may use a stool softener, such as Colace (over the counter) 100 mg twice a day.  Use MiraLax (over the counter) for constipation as needed.   Diet - low sodium heart healthy   Complete by: As directed    Do not sit on low chairs, stoools or  toilet  seats, as it may be difficult to get up from low surfaces   Complete by: As directed    Driving restrictions   Complete by: As directed    No driving for two weeks   TED hose   Complete by: As directed    Use stockings (TED hose) for three weeks on both leg(s).  You may remove them at night for sleeping.   Weight bearing as tolerated   Complete by: As directed      Allergies as of 10/01/2020      Reactions   Penicillins Itching   Has patient had a PCN reaction causing immediate rash, facial/tongue/throat swelling, SOB or lightheadedness with hypotension: no Has patient had a PCN reaction causing severe rash involving mucus membranes or skin necrosis: unkn Has patient had a PCN reaction that required hospitalization: no Has patient had a PCN reaction occurring within the last 10 years: no If all of the above answers are "NO", then may proceed with Cephalosporin use. Tolerated Cephalosporin Date: 10/01/20.      Medication List    TAKE these medications   amLODipine 2.5 MG tablet Commonly known as: NORVASC Take 1 tablet (2.5 mg total) by mouth daily.   aspirin 325 MG EC tablet Take 1 tablet (325 mg total) by mouth daily for 20 days. Then resume one 81 mg aspirin once a day. What changed:   medication strength  how much to take  additional instructions   bimatoprost 0.03 % ophthalmic solution Commonly known as: LUMIGAN Place 1 drop into both eyes at bedtime.   clopidogrel 75 MG tablet Commonly known as: PLAVIX Take 1 tablet (75 mg total) by mouth daily.   HYDROcodone-acetaminophen 5-325 MG tablet Commonly known as: NORCO/VICODIN Take 1-2 tablets by mouth every 6 (six) hours as needed for moderate pain or severe pain.   isosorbide mononitrate 30 MG 24 hr tablet Commonly known as: IMDUR Take 1 tablet (30 mg total) by mouth daily.   losartan 100 MG tablet Commonly known as: COZAAR Take 100 mg by mouth daily.   methocarbamol 500 MG tablet Commonly known as:  ROBAXIN Take 1 tablet (500 mg total) by mouth every 6 (six) hours as needed for muscle spasms.   nitroGLYCERIN 0.4 MG SL tablet Commonly known as: NITROSTAT Place 1 tablet (0.4 mg total) under the tongue every 5 (five) minutes as needed for chest pain.   Praluent 150 MG/ML Soaj Generic drug: Alirocumab Inject 150 mg/mL into the skin every 14 (fourteen) days.   Simbrinza 1-0.2 % Susp Generic drug: Brinzolamide-Brimonidine Place 1 drop into both eyes in the morning and at bedtime.   traMADol 50 MG tablet Commonly known as: ULTRAM Take 1-2 tablets (50-100 mg total) by mouth every 6 (six) hours as needed for moderate pain.   traZODone 50 MG tablet Commonly known as: DESYREL Take 0.5-1 tablets (25-50 mg total) by mouth at bedtime as needed for sleep. What changed:   how much to take  when to take this            Discharge Care Instructions  (From admission, onward)         Start     Ordered   10/01/20 0000  Weight bearing as tolerated        10/01/20 1429   10/01/20 0000  Change dressing       Comments: You have an adhesive waterproof bandage over the incision. Leave this in place until your first follow-up appointment. Once you remove this  you will not need to place another bandage.   10/01/20 1429          Follow-up Information    Luis Arabian, MD. Go on 10/15/2020.   Specialty: Orthopedic Surgery Why: You are scheduled for first post op appointment on Thursday March 3 at [facility] information: [address] [place] [address] [phone]        Luis Man, MD Follow up on 10/14/2020.   Specialty: Cardiology Why: 11:20 am Contact information: [address] [place] [address] [phone]               Signed: Theresa Duty, PA-C Orthopedic Surgery 10/05/2020, 3:39 PM

## 2020-10-14 ENCOUNTER — Encounter: Payer: Self-pay | Admitting: Cardiology

## 2020-10-14 ENCOUNTER — Ambulatory Visit (INDEPENDENT_AMBULATORY_CARE_PROVIDER_SITE_OTHER): Payer: Medicare Other | Admitting: Cardiology

## 2020-10-14 ENCOUNTER — Other Ambulatory Visit: Payer: Self-pay

## 2020-10-14 VITALS — BP 122/66 | HR 87 | Ht 73.0 in | Wt 190.8 lb

## 2020-10-14 DIAGNOSIS — R079 Chest pain, unspecified: Secondary | ICD-10-CM

## 2020-10-14 DIAGNOSIS — I2510 Atherosclerotic heart disease of native coronary artery without angina pectoris: Secondary | ICD-10-CM | POA: Diagnosis not present

## 2020-10-14 DIAGNOSIS — I10 Essential (primary) hypertension: Secondary | ICD-10-CM

## 2020-10-14 DIAGNOSIS — I25119 Atherosclerotic heart disease of native coronary artery with unspecified angina pectoris: Secondary | ICD-10-CM

## 2020-10-14 DIAGNOSIS — E785 Hyperlipidemia, unspecified: Secondary | ICD-10-CM | POA: Diagnosis not present

## 2020-10-14 DIAGNOSIS — Z9861 Coronary angioplasty status: Secondary | ICD-10-CM

## 2020-10-14 DIAGNOSIS — I209 Angina pectoris, unspecified: Secondary | ICD-10-CM | POA: Diagnosis not present

## 2020-10-14 NOTE — Patient Instructions (Addendum)
Medication Instructions:  No changes with current medications   If you need to use a NTG tablet for chest discomfort , take 2 tablets of Imdur ( isosorbide) for 2 to 3 days.    if you have  exertional pain contact office will may need to do some testing.   *If you need a refill on your cardiac medications before your next appointment, please call your pharmacy*   Lab Work:  Not needed.   Testing/Procedures:  Not needed  Follow-Up: At [facility], you and your health needs are our priority.  As part of our continuing mission to provide you with exceptional heart care, we have created designated Provider Care Teams.  These Care Teams include your primary Cardiologist (physician) and Advanced Practice Providers (APPs -  Physician Assistants and Nurse Practitioners) who all work together to provide you with the care you need, when you need it.     Your next appointment:    keep April schedule appointment  The format for your next appointment:   In Person  Provider:   Glenetta Hew, MD   Other Instructions

## 2020-10-14 NOTE — Progress Notes (Signed)
Primary Care Provider: Eulas Post, MD Cardiologist: Glenetta Hew, MD Electrophysiologist: None  Clinic Note: Chief Complaint  Patient presents with  . Chest Pain    Had a brief episode of postop chest pain after his hip surgery.  Resolved after nitroglycerin.  No further symptoms.  . Coronary Artery Disease    This is the only chest pain has had since last visit  . Follow-up    Is been doing very well until postop day 1 from his hip surgery.   ===================================  ASSESSMENT/PLAN   Problem List Items Addressed This Visit    Chest pain of uncertain etiology - Primary    He had 2 episodes of potential nitroglycerin responsive chest pain while postop.  Difficult to really tell what this was.  He had been off his aspirin Plavix is possible that there may been some mild in-stent thrombosis, but he is not having any further symptoms now.  He is on Imdur now and we will continue Imdur, if he has to use nitroglycerin, best that he double up his Imdur for couple days.  We will continue on Norvasc and Imdur as antianginal medications.  He has not tolerated beta-blocker.  He has appointment to see back in April, if he has had any more symptoms in the interim between now and then, I would have a low threshold to consider ischemic evaluation, for now, since he is actively active and exercising without any chest pain, I do not see the reason to evaluate with stress test right now.      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  No further episodes now and he is much more active now than he was.  Continue with amlodipine and now the recently added Imdur.  If uses nitroglycerin as needed, he should double up his Imdur, and if that is the case then we do need to do ischemic evaluation.      CAD S/P &  DES PCI -ostial PDA and D1 (Chronic)    Now status post two-vessel PCI.  He has had several procedures done that have had to have him interrupt his antiplatelet agents.  He is now back on aspirin Plavix having reduced aspirin back to 81 mg postoperatively. He still has existing OM 65% lesion which we are monitoring and treating medically. I am a little bit concerned about the chest discomfort he had postoperatively, but he is not having any more.  We can continue to monitor on current meds.  Plan:   Reducing aspirin to 81 mg along with Plavix. ->  He is now well over a year out from his second PCI.  Okay to hold Plavix and/or aspirin for any surgery or procedures  Imdur added to Norvasc for antianginal benefit.  Statin intolerant, on Praluent.  On losartan for additional afterload reduction and blood pressure control.        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  Moderate 65% disease in LCx evaluated with FFR that was nonischemic.  Mild to moderate disease in the RCA upstream of the original stent.  No active anginal symptoms besides the 2 symptoms perioperatively.  Would like  to continue to monitor.  He is now on combination of Imdur plus amlodipine for antianginal therapy along with losartan for afterload reduction.  Statin intolerant-therefore on Praluent.  Gwenlyn Found previously stopped Imdur, but he is now back on it.  On maintenance aspirin and Plavix.  If he has recurrent chest pain, will need to consider stress test versus relook cath.      Hyperlipidemia with target LDL less than 70 - statin intolerant (Chronic)    Now on Repatha.  Recent labs showed his LDL down to 89.  This is somewhat improved, but not exactly where we wanted to be.  He is statin intolerant, may need to consider the possibility of adding Nexletol or Nexlizet.       Essential hypertension (Chronic)    Blood pressures well controlled on current meds.  No change.  He is on amlodipine and losartan and now Imdur restarted.         ===================================  HPI:    Luis Guzman is a 75 y.o. male with a PMH notable for CAD-PCI to [place] and PDA who presents today for post hospital follow-up after right hip replacement surgery -> noted having an episode of chest pain on postop day 1.[name]  Luis Guzman was last seen on May 25, 2020 as his first post hospital follow-up after his left hip arthroplasty surgery.  He was usually walking without having pain in the left hip, but was still being bothered by the right hip.  Getting out of breath quickly and feels tired and fatigued when he exerted himself in the heat of the summer, better when the weather is cool.  Gradually believe energy level low.  No recurrent angina.  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  Failed nonoperative management.  In postop recovery, had 8/10 chest pain after physical therapy was completed.  He described it as a crushing pain-lasted 5 minutes.  Rapid response called, was given sublingual nitroglycerin and aspirin.  He had a second episode 5 minutes later.  Symptoms relieved.  Vitals were stable.  Serial troponins were normal.  Seen in cardiology consult.  Was started on Imdur.  Was felt to be okay for discharge with possible outpatient ischemic evaluation.  Reviewed  CV studies:    The following studies were reviewed today: (if available, images/films reviewed: From Epic Chart or Care Everywhere) . TTE 10/01/2020: EF 55 to 60%.  Normal LV function and no wall motion abnormality.  Normal RV.  Normal valves..   Interval History:   Luis Guzman returns today feeling much better having had both hips done.  He is  walking and enjoying not having the pain of walking.  He is actually doing more than he had been before.  He is not having any issues whatsoever of any chest pain or pressure with rest or exertion.  No PND orthopnea edema.  He has not had any further chest pain like he had in the hospital.  In fact he is more active now than he had been preop, and is not having any symptoms.  CV Review of Symptoms (Summary) Cardiovascular ROS: positive for - 2 different episodes of chest discomfort while in the hospital.  These both at rest and postop.  Since that he has had no further symptoms.  Both had some relief with nitroglycerin. negative for - dyspnea on exertion, edema,  irregular heartbeat, orthopnea, palpitations, paroxysmal nocturnal dyspnea, rapid heart rate, shortness of breath or Syncope/near syncope or TIA/amaurosis fugax, claudication  The patient does not have symptoms concerning for COVID-19 infection (fever, chills, cough, or new shortness of breath).   REVIEWED OF SYSTEMS   Review of Systems  Constitutional: Negative for malaise/fatigue (Getting energy back postoperatively, but doing much better.) and weight loss.  HENT: Negative for congestion and nosebleeds.   Respiratory: Negative for cough and shortness of breath.   Cardiovascular: Positive for chest pain (See HPI).  Gastrointestinal: Negative for blood in stool and melena.  Genitourinary: Negative for hematuria.  Musculoskeletal: Positive for joint pain (Hip pain is notably improved.).  Neurological: Negative for dizziness and weakness.  Endo/Heme/Allergies: Bruises/bleeds easily (Better with reduced dose of aspirin).  Psychiatric/Behavioral: Negative for memory loss. The patient is not nervous/anxious and does not have insomnia.    I have reviewed and (if needed) personally updated the patient's problem list, medications, allergies, past medical and surgical history, social and family history.   PAST MEDICAL HISTORY   Past Medical  History:  Diagnosis Date  . AAA (abdominal aortic aneurysm) ([place])   . Anemia    as infant history of  . CAD S/P PTCA & DES PCI - for Progressive Angina 02/26/2019   Cath-PCI 02/26/2019: EF 55-65%. mCx 65% (FFR 0.82 - Med Rx). D2 85% - Wolverine Scoring Balloon PTCA (2.0 mm). Ost rPDA - 90% -Resolute Onyx 2.5 x 15 (2.6 mm). --> 06/2019: staged DES PCI ost D1 (RESOLUTE ONYX 2.25 mm x 50 mm (2.6 mm) positioned to avoid the true ostium)  . Elevated PSA   . GERD (gastroesophageal reflux disease)   . History of diabetes mellitus, type II    loss weight under control currently  . History of hiatal hernia    40 yrs ago  . History of pancreatitis    elevated lipase levels   . HYPERLIPIDEMIA 01/26/2009  . HYPERTENSION 01/26/2009  . 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 lower back pain, s/p cervical neck surgery"stiffness" remains  . Transfusion history    infant "anemia"    PAST SURGICAL HISTORY   Past Surgical History:  Procedure Laterality Date  . APPENDECTOMY  ~ 1959  . BACK SURGERY    . CERVICAL DISC ARTHROPLASTY N/A 07/30/2018   Procedure: Cervical five-six Cervical six-seven Artificial disc replacement;  Surgeon: Kristeen Miss, MD;  Location: [facility];  Service: Neurosurgery;  Laterality: N/A;  . CERVICAL LAMINECTOMY  1997   C 4 and C 5  . CHOLECYSTECTOMY N/A 04/15/2014   Procedure: LAPAROSCOPIC CHOLECYSTECTOMY WITH INTRAOPERATIVE CHOLANGIOGRAM;  Surgeon: Armandina Gemma, MD;  Location: [facility];  Service: General;  Laterality: N/A;  . COLONOSCOPY    . CORONARY BALLOON ANGIOPLASTY N/A 02/26/2019   Procedure: CORONARY BALLOON ANGIOPLASTY;  Surgeon: Leonie Man, MD;  Location: [facility];  Service: Cardiovascular -> scoring balloon PTCA (Wolverine 2.0 mm) of ostial D2 85% reducing to 20-30%.  . CORONARY STENT INTERVENTION N/A 02/26/2019   Procedure: CORONARY STENT INTERVENTION;  Surgeon: Leonie Man, MD;  Location: [facility];; PCI ostial RPDA  (initial attempt of PTCA only led to small local tear/dissection covered with stent) Resolute Onyx 2.5 mm x 15 mm (2.6 mm  . CORONARY STENT INTERVENTION N/A 07/05/2019   Procedure: CORONARY STENT INTERVENTION;  Surgeon: Leonie Man, MD;  Location: [facility];  Service: Cardiovascular;; ostial D1 95% (restenosis of PTCA site) -> DES PCI RESOLUTE ONYX  2.25 mm x 50 mm (2.6 mm) positioned to avoid the true ostium.   . ESOPHAGOGASTRODUODENOSCOPY N/A 03/13/2014   Procedure: ESOPHAGOGASTRODUODENOSCOPY (EGD);  Surgeon: Wonda Horner, MD;  Location: [facility];  Service: Endoscopy;  Laterality: N/A;  . INTRAVASCULAR PRESSURE WIRE/FFR STUDY N/A 02/26/2019   Procedure: INTRAVASCULAR PRESSURE WIRE/FFR STUDY;  Surgeon: Leonie Man, MD;  Location: [place];  Service: Cardiovascular;; mCx ~65% - DFR 0.92, FR 0.82 - BORDERLINE--> MED Rx  . LEFT HEART CATH AND CORONARY ANGIOGRAPHY N/A 02/26/2019   Procedure: LEFT HEART CATH AND CORONARY ANGIOGRAPHY;  Surgeon: Leonie Man, MD;  Location: [facility];  EF 55-65%. mCx 65% (FFR 0.82 - Med Rx). D2 85% - Wolverine Scoring Balloon PTCA (2.0 mm). Ost rPDA - 90% -Resolute Onyx 2.5 x 15 (2.6 mm).   . LEFT HEART CATH AND CORONARY ANGIOGRAPHY N/A 07/05/2019   Procedure: LEFT HEART CATH AND CORONARY ANGIOGRAPHY;  Surgeon: Leonie Man, MD;  Location: [facility];; ostial D1 95% (restenosis of PTCA site) -> DES PCI.  PDA stent widely patent.  Mid CX stable 65% lesion.  Proximal RCA 40%.  Mid RCA 45%.  Normal EDP.  [name] 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  . SHOULDER ARTHROSCOPY Bilateral   . SHOULDER ARTHROSCOPY W/ ROTATOR CUFF REPAIR Bilateral 2004-2009   left 2004, right 2009  . TOTAL HIP ARTHROPLASTY Left 03/04/2020   Procedure: TOTAL HIP ARTHROPLASTY ANTERIOR APPROACH;  Surgeon: Gaynelle Arabian, MD;  Location: [facility];  Service: Orthopedics;  Laterality: Left;  149min  . TOTAL HIP ARTHROPLASTY Right 09/30/2020    Procedure: TOTAL HIP ARTHROPLASTY ANTERIOR APPROACH;  Surgeon: Gaynelle Arabian, MD;  Location: [facility];  Service: Orthopedics;  Laterality: Right;  123min  . TRANSTHORACIC ECHOCARDIOGRAM  02/2020/10/01/2020   a) EF 55 to 60%.  GR 1 DD.  No R WMA.  Trivial MR.  Otherwise normal.; b) EF 55 to 60%.  Normal LV function and no wall motion abnormality.  Normal RV.  Normal valves..  . VASECTOMY    . WISDOM TOOTH EXTRACTION       Cath-PCI 07/05/2019:ostial D1 95% (restenosis of PTCA site)-> DES PCIRESOLUTE ONYX 2.25 mm x 50 mm (2.6 mm) positioned to avoid the true ostium.PDA stent widely patent. Mid CX stable 65% lesion. Proximal RCA 40%. Mid RCA 45%. Normal EDP.    Immunization History  Administered Date(s) Administered  . Fluad Quad(high Dose 65+) 05/06/2019, 05/06/2020  . Influenza Split 05/15/2012  . Influenza Whole 06/15/2010  . Influenza, High Dose Seasonal PF 04/15/2016, 04/18/2017, 05/02/2018  . Influenza,inj,Quad PF,6+ Mos 08/02/2013, 05/05/2014  . Influenza,inj,quad, With Preservative 05/16/2019  . Influenza-Unspecified 07/30/2015  . PFIZER(Purple Top)SARS-COV-2 Vaccination 08/30/2019, 09/20/2019, 06/13/2020  . Pneumococcal Conjugate-13 09/27/2013  . Pneumococcal Polysaccharide-23 09/15/2008, 04/16/2015  . Td 08/16/2003  . Tdap 05/05/2014  . Zoster 06/17/2011  . Zoster Recombinat (Shingrix) 10/14/2018, 10/14/2018, 03/20/2019, 03/20/2019    MEDICATIONS/ALLERGIES   Current Meds  Medication Sig  . Alirocumab (PRALUENT) 150 MG/ML SOAJ Inject 150 mg/mL into the skin every 14 (fourteen) days.  [name] amLODipine (NORVASC) 2.5 MG tablet Take 1 tablet (2.5 mg total) by mouth daily.  . [EXPIRED] aspirin EC 325 MG EC tablet Take 1 tablet (325 mg total) by mouth daily for 20 days. Then resume one 81 mg aspirin once a day.  . bimatoprost (LUMIGAN) 0.03 % ophthalmic solution Place 1 drop into both eyes at bedtime.  . Brinzolamide-Brimonidine (SIMBRINZA) 1-0.2 % SUSP Place 1  drop into both  eyes in the morning and at bedtime.  . clopidogrel (PLAVIX) 75 MG tablet Take 1 tablet (75 mg total) by mouth daily.  [name] HYDROcodone-acetaminophen (NORCO/VICODIN) 5-325 MG tablet Take 1-2 tablets by mouth every 6 (six) hours as needed for moderate pain or severe pain.  . isosorbide mononitrate (IMDUR) 30 MG 24 hr tablet Take 1 tablet (30 mg total) by mouth daily.  [name] losartan (COZAAR) 100 MG tablet Take 100 mg by mouth daily.  . methocarbamol (ROBAXIN) 500 MG tablet Take 1 tablet (500 mg total) by mouth every 6 (six) hours as needed for muscle spasms.  . nitroGLYCERIN (NITROSTAT) 0.4 MG SL tablet Place 1 tablet (0.4 mg total) under the tongue every 5 (five) minutes as needed for chest pain.  . traMADol (ULTRAM) 50 MG tablet Take 1-2 tablets (50-100 mg total) by mouth every 6 (six) hours as needed for moderate pain.  . traZODone (DESYREL) 50 MG tablet Take 0.5-1 tablets (25-50 mg total) by mouth at bedtime as needed for sleep. (Patient taking differently: Take 50 mg by mouth at bedtime.)    Allergies  Allergen Reactions  . Penicillins Itching    Has patient had a PCN reaction causing immediate rash, facial/tongue/throat swelling, SOB or lightheadedness with hypotension: no Has patient had a PCN reaction causing severe rash involving mucus membranes or skin necrosis: unkn Has patient had a PCN reaction that required hospitalization: no Has patient had a PCN reaction occurring within the last 10 years: no If all of the above answers are "NO", then may proceed with Cephalosporin use.  Tolerated Cephalosporin Date: 10/01/20.    SOCIAL HISTORY/FAMILY HISTORY   Reviewed in Epic:  Pertinent findings:  Social History   Tobacco Use  . Smoking status: Former Smoker    Packs/day: 1.00    Years: 5.00    Pack years: 5.00    Types: Cigarettes    Quit date: 10/17/1968    Years since quitting: 52.0  . Smokeless tobacco: Never Used  Vaping Use  . Vaping Use: Never used  Substance Use Topics  .  Alcohol use: Not Currently    Comment: "quit drinking ~ 10/1968  . Drug use: No   Social History   Social History Narrative  . Not on file    OBJCTIVE -PE, EKG, labs   Wt Readings from Last 3 Encounters:  10/14/20 190 lb 12.8 oz (86.5 kg)  09/30/20 193 lb 3 oz (87.6 kg)  09/07/20 193 lb 3 oz (87.6 kg)    Physical Exam: BP 122/66   Pulse 87   Ht 6\' 1"  (1.854 m)   Wt 190 lb 12.8 oz (86.5 kg)   SpO2 96%   BMI 25.17 kg/m  Physical Exam Constitutional:      General: He is not in acute distress.    Appearance: Normal appearance. He is not ill-appearing or toxic-appearing.     Comments: Well-groomed.  Healthy-appearing.  HENT:     Head: Normocephalic and atraumatic.  Neck:     Vascular: No carotid bruit.  Cardiovascular:     Rate and Rhythm: Normal rate and regular rhythm.     Pulses: Normal pulses.     Heart sounds: Normal heart sounds. No murmur heard. No friction rub. No gallop.   Pulmonary:     Effort: Pulmonary effort is normal. No respiratory distress.     Breath sounds: Normal breath sounds.  Chest:     Chest wall: No tenderness.  Musculoskeletal:  General: No swelling. Normal range of motion.     Cervical back: Normal range of motion and neck supple.  Skin:    General: Skin is warm and dry.  Neurological:     General: No focal deficit present.     Mental Status: He is alert and oriented to person, place, and time.  Psychiatric:        Mood and Affect: Mood normal.        Behavior: Behavior normal.        Thought Content: Thought content normal.        Judgment: Judgment normal.     Adult ECG Report From October 01, 2020: Sinus rhythm, rate 77 bpm.  Normal EKG. Not checked today.  Recent Labs: Reviewed Lab Results  Component Value Date   CHOL 164 08/25/2020   HDL 39 (L) 08/25/2020   LDLCALC 89 08/25/2020   LDLDIRECT 129.0 08/19/2016   TRIG 215 (H) 08/25/2020   CHOLHDL 4.2 08/25/2020   Lab Results  Component Value Date   CREATININE 0.92  10/01/2020   BUN 15 10/01/2020   NA 138 10/01/2020   K 3.8 10/01/2020   CL 107 10/01/2020   CO2 22 10/01/2020   CBC Latest Ref Rng & Units 10/01/2020 09/30/2020 09/07/2020  WBC 4.0 - 10.5 K/uL 20.9(H) 8.9 8.3  Hemoglobin 13.0 - 17.0 g/dL 12.7(L) 16.5 15.8  Hematocrit 39.0 - 52.0 % 36.6(L) 47.7 46.8  Platelets 150 - 400 K/uL 206 206 221    Lab Results  Component Value Date   TSH 0.61 05/06/2019    ==================================================  COVID-19 Education: The signs and symptoms of COVID-19 were discussed with the patient and how to seek care for testing (follow up with PCP or arrange E-visit).   The importance of social distancing and COVID-19 vaccination was discussed today. The patient is practicing social distancing & Masking.   I spent a total of 54minutes with the patient spent in direct patient consultation.  Additional time spent with chart review  / charting (studies, outside notes, etc): 55min Total Time: 28min   Current medicines are reviewed at length with the patient today.  (+/- concerns) n/a  This visit occurred during the SARS-CoV-2 public health emergency.  Safety protocols were in place, including screening questions prior to the visit, additional usage of staff PPE, and extensive cleaning of exam room while observing appropriate contact time as indicated for disinfecting solutions.  Notice: This dictation was prepared with Dragon dictation along with smaller phrase technology. Any transcriptional errors that result from this process are unintentional and may not be corrected upon review.  Patient Instructions / Medication Changes & Studies & Tests Ordered   Patient Instructions  Medication Instructions:  No changes with current medications   If you need to use a NTG tablet for chest discomfort , take 2 tablets of Imdur ( isosorbide) for 2 to 3 days.    if you have  exertional pain contact office will may need to do some testing.   *If you  need a refill on your cardiac medications before your next appointment, please call your pharmacy*   Lab Work:  Not needed.   Testing/Procedures:  Not needed  Follow-Up: At [facility], you and your health needs are our priority.  As part of our continuing mission to provide you with exceptional heart care, we have created designated Provider Care Teams.  These Care Teams include your primary Cardiologist (physician) and Advanced Practice Providers (APPs -  Physician Assistants and  Nurse Practitioners) who all work together to provide you with the care you need, when you need it.     Your next appointment:    keep April schedule appointment  The format for your next appointment:   In Person  Provider:   Glenetta Hew, MD   Other Instructions     Studies Ordered:   No orders of the defined types were placed in this encounter.    Glenetta Hew, M.D., M.S. Interventional Cardiologist   Pager # [phone] Phone # [phone] [address] [place], [place] [address]   Thank you for choosing Heartcare at [facility]!!

## 2020-11-05 DIAGNOSIS — Z96641 Presence of right artificial hip joint: Secondary | ICD-10-CM | POA: Diagnosis not present

## 2020-11-05 DIAGNOSIS — M5416 Radiculopathy, lumbar region: Secondary | ICD-10-CM | POA: Diagnosis not present

## 2020-11-05 DIAGNOSIS — Z471 Aftercare following joint replacement surgery: Secondary | ICD-10-CM | POA: Diagnosis not present

## 2020-11-09 ENCOUNTER — Encounter: Payer: Self-pay | Admitting: Cardiology

## 2020-11-09 NOTE — Assessment & Plan Note (Addendum)
Now status post two-vessel PCI.  He has had several procedures done that have had to have him interrupt his antiplatelet agents.  He is now back on aspirin Plavix having reduced aspirin back to 81 mg postoperatively. He still has existing OM 65% lesion which we are monitoring and treating medically. I am a little bit concerned about the chest discomfort he had postoperatively, but he is not having any more.  We can continue to monitor on current meds.  Plan:   Reducing aspirin to 81 mg along with Plavix. ->  He is now well over a year out from his second PCI.  Okay to hold Plavix and/or aspirin for any surgery or procedures  Imdur added to Norvasc for antianginal benefit.  Statin intolerant, on Praluent.  On losartan for additional afterload reduction and blood pressure control.

## 2020-11-09 NOTE — Assessment & Plan Note (Signed)
Now on Repatha.  Recent labs showed his LDL down to 89.  This is somewhat improved, but not exactly where we wanted to be.  He is statin intolerant, may need to consider the possibility of adding Nexletol or Nexlizet.

## 2020-11-09 NOTE — Assessment & Plan Note (Signed)
He had 2 episodes of potential nitroglycerin responsive chest pain while postop.  Difficult to really tell what this was.  He had been off his aspirin Plavix is possible that there may been some mild in-stent thrombosis, but he is not having any further symptoms now.  He is on Imdur now and we will continue Imdur, if he has to use nitroglycerin, best that he double up his Imdur for couple days.  We will continue on Norvasc and Imdur as antianginal medications.  He has not tolerated beta-blocker.  He has appointment to see back in April, if he has had any more symptoms in the interim between now and then, I would have a low threshold to consider ischemic evaluation, for now, since he is actively active and exercising without any chest pain, I do not see the reason to evaluate with stress test right now.

## 2020-11-09 NOTE — Assessment & Plan Note (Signed)
He is not having any anginal like he had before his last PCI, he had 2 episodes that were nitro responsive postop which could very well be related to anesthesia or intubation.  No further episodes now and he is much more active now than he was.  Continue with amlodipine and now the recently added Imdur.  If uses nitroglycerin as needed, he should double up his Imdur, and if that is the case then we do need to do ischemic evaluation.

## 2020-11-09 NOTE — Assessment & Plan Note (Signed)
Blood pressures well controlled on current meds.  No change.  He is on amlodipine and losartan and now Imdur restarted.

## 2020-11-09 NOTE — Assessment & Plan Note (Signed)
Both severe lesions were treated with DES stents in the PDA and first diagonal.  Moderate 65% disease in LCx evaluated with FFR that was nonischemic.  Mild to moderate disease in the RCA upstream of the original stent.  No active anginal symptoms besides the 2 symptoms perioperatively.  Would like to continue to monitor.  He is now on combination of Imdur plus amlodipine for antianginal therapy along with losartan for afterload reduction.  Statin intolerant-therefore on Praluent.  Gwenlyn Found previously stopped Imdur, but he is now back on it.  On maintenance aspirin and Plavix.  If he has recurrent chest pain, will need to consider stress test versus relook cath.

## 2020-11-30 DIAGNOSIS — R972 Elevated prostate specific antigen [PSA]: Secondary | ICD-10-CM | POA: Diagnosis not present

## 2020-12-01 ENCOUNTER — Encounter: Payer: Self-pay | Admitting: Family Medicine

## 2020-12-02 ENCOUNTER — Ambulatory Visit (INDEPENDENT_AMBULATORY_CARE_PROVIDER_SITE_OTHER): Payer: Medicare Other | Admitting: Family Medicine

## 2020-12-02 ENCOUNTER — Encounter: Payer: Self-pay | Admitting: Family Medicine

## 2020-12-02 ENCOUNTER — Other Ambulatory Visit: Payer: Self-pay

## 2020-12-02 VITALS — BP 150/80 | HR 72 | Temp 98.2°F | Wt 191.7 lb

## 2020-12-02 DIAGNOSIS — I209 Angina pectoris, unspecified: Secondary | ICD-10-CM

## 2020-12-02 DIAGNOSIS — J019 Acute sinusitis, unspecified: Secondary | ICD-10-CM

## 2020-12-02 MED ORDER — HYDROCODONE-HOMATROPINE 5-1.5 MG/5ML PO SYRP: 5.0000 mL | ORAL_SOLUTION | Freq: Four times a day (QID) | ORAL | 0 refills | Status: DC | PRN

## 2020-12-02 MED ORDER — DOXYCYCLINE HYCLATE 100 MG PO TABS: 100.0000 mg | ORAL_TABLET | Freq: Two times a day (BID) | ORAL | 0 refills | Status: DC

## 2020-12-02 NOTE — Progress Notes (Signed)
Established Patient Office Visit  Subjective:  Patient ID: Luis Guzman, male    DOB: [birth year]  Age: 75 y.o. MRN: [id number]  CC:  Chief Complaint  Patient presents with  . Cough    X 1 week, productive cough, runny nose, head pressure, taking OTC allergy meds    HPI Luis Guzman presents for slightly over 1 week history of nasal congestion, sinus pressure, and now productive cough.  He feels the symptoms are getting worse.  Has had some intermittent headaches and thick green mucus coming up from his cough.  He is also had some colored nasal discharge.  He is tried multiple things over-the-counter including Mucinex and Sudafed without much improvement.  He is worried about acute sinus infection.  No fever.  Does have history of allergy to penicillin.  Had right total hip replacement couple months ago and recovering from that.  Past Medical History:  Diagnosis Date  . AAA (abdominal aortic aneurysm) ([place])   . Anemia    as infant history of  . CAD S/P PTCA & DES PCI - for Progressive Angina 02/26/2019   Cath-PCI 02/26/2019: EF 55-65%. mCx 65% (FFR 0.82 - Med Rx). D2 85% - Wolverine Scoring Balloon PTCA (2.0 mm). Ost rPDA - 90% -Resolute Onyx 2.5 x 15 (2.6 mm). --> 06/2019: staged DES PCI ost D1 (RESOLUTE ONYX 2.25 mm x 50 mm (2.6 mm) positioned to avoid the true ostium)  . Elevated PSA   . GERD (gastroesophageal reflux disease)   . History of diabetes mellitus, type II    loss weight under control currently  . History of hiatal hernia    40 yrs ago  . History of pancreatitis    elevated lipase levels   . HYPERLIPIDEMIA 01/26/2009  . HYPERTENSION 01/26/2009  . 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 lower back pain, s/p cervical neck surgery"stiffness" remains  . Transfusion history    infant "anemia"    Past Surgical History:  Procedure Laterality Date  . APPENDECTOMY  ~ 1959  . BACK SURGERY    . CERVICAL DISC ARTHROPLASTY N/A 07/30/2018   Procedure:  Cervical five-six Cervical six-seven Artificial disc replacement;  Surgeon: Kristeen Miss, MD;  Location: [facility];  Service: Neurosurgery;  Laterality: N/A;  . CERVICAL LAMINECTOMY  1997   C 4 and C 5  . CHOLECYSTECTOMY N/A 04/15/2014   Procedure: LAPAROSCOPIC CHOLECYSTECTOMY WITH INTRAOPERATIVE CHOLANGIOGRAM;  Surgeon: Armandina Gemma, MD;  Location: [facility];  Service: General;  Laterality: N/A;  . COLONOSCOPY    . CORONARY BALLOON ANGIOPLASTY N/A 02/26/2019   Procedure: CORONARY BALLOON ANGIOPLASTY;  Surgeon: Leonie Man, MD;  Location: [facility];  Service: Cardiovascular -> scoring balloon PTCA (Wolverine 2.0 mm) of ostial D2 85% reducing to 20-30%.  . CORONARY STENT INTERVENTION N/A 02/26/2019   Procedure: CORONARY STENT INTERVENTION;  Surgeon: Leonie Man, MD;  Location: [facility];; PCI ostial RPDA (initial attempt of PTCA only led to small local tear/dissection covered with stent) Resolute Onyx 2.5 mm x 15 mm (2.6 mm  . CORONARY STENT INTERVENTION N/A 07/05/2019   Procedure: CORONARY STENT INTERVENTION;  Surgeon: Leonie Man, MD;  Location: [facility];  Service: Cardiovascular;; ostial D1 95% (restenosis of PTCA site) -> DES PCI RESOLUTE ONYX 2.25 mm x 50 mm (2.6 mm) positioned to avoid the true ostium.   . ESOPHAGOGASTRODUODENOSCOPY N/A 03/13/2014   Procedure: ESOPHAGOGASTRODUODENOSCOPY (EGD);  Surgeon: Wonda Horner, MD;  Location: [facility]  ENDOSCOPY;  Service: Endoscopy;  Laterality: N/A;  . INTRAVASCULAR PRESSURE WIRE/FFR STUDY N/A 02/26/2019   Procedure: INTRAVASCULAR PRESSURE WIRE/FFR STUDY;  Surgeon: Leonie Man, MD;  Location: [facility];  Service: Cardiovascular;; mCx ~65% - DFR 0.92, FR 0.82 - BORDERLINE--> MED Rx  . LEFT HEART CATH AND CORONARY ANGIOGRAPHY N/A 02/26/2019   Procedure: LEFT HEART CATH AND CORONARY ANGIOGRAPHY;  Surgeon: Leonie Man, MD;  Location: [facility];  EF 55-65%. mCx 65% (FFR 0.82 - Med Rx). D2 85% - Wolverine  Scoring Balloon PTCA (2.0 mm). Ost rPDA - 90% -Resolute Onyx 2.5 x 15 (2.6 mm).   . LEFT HEART CATH AND CORONARY ANGIOGRAPHY N/A 07/05/2019   Procedure: LEFT HEART CATH AND CORONARY ANGIOGRAPHY;  Surgeon: Leonie Man, MD;  Location: [facility];; ostial D1 95% (restenosis of PTCA site) -> DES PCI.  PDA stent widely patent.  Mid CX stable 65% lesion.  Proximal RCA 40%.  Mid RCA 45%.  Normal EDP.  [name] 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  . SHOULDER ARTHROSCOPY Bilateral   . SHOULDER ARTHROSCOPY W/ ROTATOR CUFF REPAIR Bilateral 2004-2009   left 2004, right 2009  . TOTAL HIP ARTHROPLASTY Left 03/04/2020   Procedure: TOTAL HIP ARTHROPLASTY ANTERIOR APPROACH;  Surgeon: Gaynelle Arabian, MD;  Location: [facility];  Service: Orthopedics;  Laterality: Left;  168min  . TOTAL HIP ARTHROPLASTY Right 09/30/2020   Procedure: TOTAL HIP ARTHROPLASTY ANTERIOR APPROACH;  Surgeon: Gaynelle Arabian, MD;  Location: [facility];  Service: Orthopedics;  Laterality: Right;  133min  . TRANSTHORACIC ECHOCARDIOGRAM  02/2020/10/01/2020   a) EF 55 to 60%.  GR 1 DD.  No R WMA.  Trivial MR.  Otherwise normal.; b) EF 55 to 60%.  Normal LV function and no wall motion abnormality.  Normal RV.  Normal valves..  . VASECTOMY    . WISDOM TOOTH EXTRACTION      Family History  Problem Relation Age of Onset  . Hypertension Mother   . Hypertension Father   . Arthritis Sister        rheumatoid  . Stroke Sister 69    Social History   Socioeconomic History  . Marital status: Married    Spouse name: Not on file  . Number of children: Not on file  . Years of education: Not on file  . Highest education level: Not on file  Occupational History  . Not on file  Tobacco Use  . Smoking status: Former Smoker    Packs/day: 1.00    Years: 5.00    Pack years: 5.00    Types: Cigarettes    Quit date: 10/17/1968    Years since quitting: 52.1  . Smokeless tobacco: Never Used  Vaping Use  . Vaping Use: Never used   Substance and Sexual Activity  . Alcohol use: Not Currently    Comment: "quit drinking ~ 10/1968  . Drug use: No  . Sexual activity: Not on file  Other Topics Concern  . Not on file  Social History Narrative  . Not on file   Social Determinants of Health   Financial Resource Strain: Low Risk   . Difficulty of Paying Living Expenses: Not hard at all  Food Insecurity: No Food Insecurity  . Worried About Charity fundraiser in the Last Year: Never true  . Ran Out of Food in the Last Year: Never true  Transportation Needs: No Transportation Needs  . Lack of Transportation (Medical): No  .  Lack of Transportation (Non-Medical): No  Physical Activity: Sufficiently Active  . Days of Exercise per Week: 7 days  . Minutes of Exercise per Session: 30 min  Stress: No Stress Concern Present  . Feeling of Stress : Not at all  Social Connections: Moderately Integrated  . Frequency of Communication with Friends and Family: More than three times a week  . Frequency of Social Gatherings with Friends and Family: More than three times a week  . Attends Religious Services: More than 4 times per year  . Active Member of Clubs or Organizations: No  . Attends Archivist Meetings: Never  . Marital Status: Married  Human resources officer Violence: Not At Risk  . Fear of Current or Ex-Partner: No  . Emotionally Abused: No  . Physically Abused: No  . Sexually Abused: No    Outpatient Medications Prior to Visit  Medication Sig Dispense Refill  . Alirocumab (PRALUENT) 150 MG/ML SOAJ Inject 150 mg/mL into the skin every 14 (fourteen) days.    [name] amLODipine (NORVASC) 2.5 MG tablet Take 1 tablet (2.5 mg total) by mouth daily. 90 tablet 3  . bimatoprost (LUMIGAN) 0.03 % ophthalmic solution Place 1 drop into both eyes at bedtime.    . Brinzolamide-Brimonidine (SIMBRINZA) 1-0.2 % SUSP Place 1 drop into both eyes in the morning and at bedtime.    . clopidogrel (PLAVIX) 75 MG tablet Take 1 tablet (75 mg  total) by mouth daily. 90 tablet 3  . HYDROcodone-acetaminophen (NORCO/VICODIN) 5-325 MG tablet Take 1-2 tablets by mouth every 6 (six) hours as needed for moderate pain or severe pain. 42 tablet 0  . isosorbide mononitrate (IMDUR) 30 MG 24 hr tablet Take 1 tablet (30 mg total) by mouth daily. 30 tablet 0  . losartan (COZAAR) 100 MG tablet Take 100 mg by mouth daily.    . methocarbamol (ROBAXIN) 500 MG tablet Take 1 tablet (500 mg total) by mouth every 6 (six) hours as needed for muscle spasms. 40 tablet 0  . nitroGLYCERIN (NITROSTAT) 0.4 MG SL tablet Place 1 tablet (0.4 mg total) under the tongue every 5 (five) minutes as needed for chest pain. 25 tablet 3  . traMADol (ULTRAM) 50 MG tablet Take 1-2 tablets (50-100 mg total) by mouth every 6 (six) hours as needed for moderate pain. 40 tablet 0  . traZODone (DESYREL) 50 MG tablet Take 0.5-1 tablets (25-50 mg total) by mouth at bedtime as needed for sleep. (Patient taking differently: Take 50 mg by mouth at bedtime.) 30 tablet 3   No facility-administered medications prior to visit.    Allergies  Allergen Reactions  . Penicillins Itching    Has patient had a PCN reaction causing immediate rash, facial/tongue/throat swelling, SOB or lightheadedness with hypotension: no Has patient had a PCN reaction causing severe rash involving mucus membranes or skin necrosis: unkn Has patient had a PCN reaction that required hospitalization: no Has patient had a PCN reaction occurring within the last 10 years: no If all of the above answers are "NO", then may proceed with Cephalosporin use.  Tolerated Cephalosporin Date: 10/01/20.    ROS Review of Systems  Constitutional: Negative for chills and fever.  HENT: Positive for congestion, sinus pressure and sinus pain.   Respiratory: Positive for cough.   Cardiovascular: Negative for chest pain.      Objective:    Physical Exam Vitals reviewed.  Constitutional:      Appearance: Normal appearance.   HENT:     Right Ear: Tympanic  membrane normal.     Left Ear: Tympanic membrane normal.     Mouth/Throat:     Mouth: Mucous membranes are moist.     Pharynx: Oropharynx is clear.  Cardiovascular:     Rate and Rhythm: Normal rate and regular rhythm.  Pulmonary:     Effort: Pulmonary effort is normal.     Breath sounds: Normal breath sounds.  Neurological:     Mental Status: He is alert.     BP (!) 150/80 (BP Location: Left Arm, Patient Position: Sitting, Cuff Size: Normal)   Pulse 72   Temp 98.2 F (36.8 C) (Oral)   Wt 191 lb 11.2 oz (87 kg)   SpO2 99%   BMI 25.29 kg/m  Wt Readings from Last 3 Encounters:  12/02/20 191 lb 11.2 oz (87 kg)  10/14/20 190 lb 12.8 oz (86.5 kg)  09/30/20 193 lb 3 oz (87.6 kg)     Health Maintenance Due  Topic Date Due  . OPHTHALMOLOGY EXAM  03/30/2019  . FOOT EXAM  05/03/2019  . HEMOGLOBIN A1C  11/03/2020    There are no preventive care reminders to display for this patient.  Lab Results  Component Value Date   TSH 0.61 05/06/2019   Lab Results  Component Value Date   WBC 20.9 (H) 10/01/2020   HGB 12.7 (L) 10/01/2020   HCT 36.6 (L) 10/01/2020   MCV 92.9 10/01/2020   PLT 206 10/01/2020   Lab Results  Component Value Date   NA 138 10/01/2020   K 3.8 10/01/2020   CO2 22 10/01/2020   GLUCOSE 175 (H) 10/01/2020   BUN 15 10/01/2020   CREATININE 0.92 10/01/2020   BILITOT 1.0 09/30/2020   ALKPHOS 94 09/30/2020   AST 22 09/30/2020   ALT 19 09/30/2020   PROT 7.4 09/30/2020   ALBUMIN 4.6 09/30/2020   CALCIUM 8.5 (L) 10/01/2020   ANIONGAP 9 10/01/2020   GFR 88.33 05/06/2019   Lab Results  Component Value Date   CHOL 164 08/25/2020   Lab Results  Component Value Date   HDL 39 (L) 08/25/2020   Lab Results  Component Value Date   LDLCALC 89 08/25/2020   Lab Results  Component Value Date   TRIG 215 (H) 08/25/2020   Lab Results  Component Value Date   CHOLHDL 4.2 08/25/2020   Lab Results  Component Value Date    HGBA1C 5.8 (H) 05/06/2020      Assessment & Plan:    Probable acute sinusitis.  Patient states he has had worsening of symptoms over 1 week into this.  Some purulent nasal secretions.  -Continue Mucinex and stay well-hydrated -Doxycycline 100 mg twice daily with food for 10 days -Patient complaining of increased night cough.  He has used hydrocodone cough syrup in the past and requesting prescription for that. -Follow-up for any persistent or worsening symptoms  Meds ordered this encounter  Medications  . doxycycline (VIBRA-TABS) 100 MG tablet    Sig: Take 1 tablet (100 mg total) by mouth 2 (two) times daily.    Dispense:  20 tablet    Refill:  0  . HYDROcodone-homatropine (HYCODAN) 5-1.5 MG/5ML syrup    Sig: Take 5 mLs by mouth every 6 (six) hours as needed for up to 10 days.    Dispense:  120 mL    Refill:  0    Follow-up: No follow-ups on file.    Carolann Littler, MD

## 2020-12-02 NOTE — Patient Instructions (Signed)
Sinusitis, Adult Sinusitis is inflammation of your sinuses. Sinuses are hollow spaces in the bones around your face. Your sinuses are located:  Around your eyes.  In the middle of your forehead.  Behind your nose.  In your cheekbones. Mucus normally drains out of your sinuses. When your nasal tissues become inflamed or swollen, mucus can become trapped or blocked. This allows bacteria, viruses, and fungi to grow, which leads to infection. Most infections of the sinuses are caused by a virus. Sinusitis can develop quickly. It can last for up to 4 weeks (acute) or for more than 12 weeks (chronic). Sinusitis often develops after a cold. What are the causes? This condition is caused by anything that creates swelling in the sinuses or stops mucus from draining. This includes:  Allergies.  Asthma.  Infection from bacteria or viruses.  Deformities or blockages in your nose or sinuses.  Abnormal growths in the nose (nasal polyps).  Pollutants, such as chemicals or irritants in the air.  Infection from fungi (rare). What increases the risk? You are more likely to develop this condition if you:  Have a weak body defense system (immune system).  Do a lot of swimming or diving.  Overuse nasal sprays.  Smoke. What are the signs or symptoms? The main symptoms of this condition are pain and a feeling of pressure around the affected sinuses. Other symptoms include:  Stuffy nose or congestion.  Thick drainage from your nose.  Swelling and warmth over the affected sinuses.  Headache.  Upper toothache.  A cough that may get worse at night.  Extra mucus that collects in the throat or the back of the nose (postnasal drip).  Decreased sense of smell and taste.  Fatigue.  A fever.  Sore throat.  Bad breath. How is this diagnosed? This condition is diagnosed based on:  Your symptoms.  Your medical history.  A physical exam.  Tests to find out if your condition is  acute or chronic. This may include: ? Checking your nose for nasal polyps. ? Viewing your sinuses using a device that has a light (endoscope). ? Testing for allergies or bacteria. ? Imaging tests, such as an MRI or CT scan. In rare cases, a bone biopsy may be done to rule out more serious types of fungal sinus disease. How is this treated? Treatment for sinusitis depends on the cause and whether your condition is chronic or acute.  If caused by a virus, your symptoms should go away on their own within 10 days. You may be given medicines to relieve symptoms. They include: ? Medicines that shrink swollen nasal passages (topical intranasal decongestants). ? Medicines that treat allergies (antihistamines). ? A spray that eases inflammation of the nostrils (topical intranasal corticosteroids). ? Rinses that help get rid of thick mucus in your nose (nasal saline washes).  If caused by bacteria, your health care provider may recommend waiting to see if your symptoms improve. Most bacterial infections will get better without antibiotic medicine. You may be given antibiotics if you have: ? A severe infection. ? A weak immune system.  If caused by narrow nasal passages or nasal polyps, you may need to have surgery. Follow these instructions at home: Medicines  Take, use, or apply over-the-counter and prescription medicines only as told by your health care provider. These may include nasal sprays.  If you were prescribed an antibiotic medicine, take it as told by your health care provider. Do not stop taking the antibiotic even if you start   to feel better. Hydrate and humidify  Drink enough fluid to keep your urine pale yellow. Staying hydrated will help to thin your mucus.  Use a cool mist humidifier to keep the humidity level in your home above 50%.  Inhale steam for 10-15 minutes, 3-4 times a day, or as told by your health care provider. You can do this in the bathroom while a hot shower is  running.  Limit your exposure to cool or dry air.   Rest  Rest as much as possible.  Sleep with your head raised (elevated).  Make sure you get enough sleep each night. General instructions  Apply a warm, moist washcloth to your face 3-4 times a day or as told by your health care provider. This will help with discomfort.  Wash your hands often with soap and water to reduce your exposure to germs. If soap and water are not available, use hand sanitizer.  Do not smoke. Avoid being around people who are smoking (secondhand smoke).  Keep all follow-up visits as told by your health care provider. This is important.   Contact a health care provider if:  You have a fever.  Your symptoms get worse.  Your symptoms do not improve within 10 days. Get help right away if:  You have a severe headache.  You have persistent vomiting.  You have severe pain or swelling around your face or eyes.  You have vision problems.  You develop confusion.  Your neck is stiff.  You have trouble breathing. Summary  Sinusitis is soreness and inflammation of your sinuses. Sinuses are hollow spaces in the bones around your face.  This condition is caused by nasal tissues that become inflamed or swollen. The swelling traps or blocks the flow of mucus. This allows bacteria, viruses, and fungi to grow, which leads to infection.  If you were prescribed an antibiotic medicine, take it as told by your health care provider. Do not stop taking the antibiotic even if you start to feel better.  Keep all follow-up visits as told by your health care provider. This is important. This information is not intended to replace advice given to you by your health care provider. Make sure you discuss any questions you have with your health care provider. Document Revised: 01/01/2018 Document Reviewed: 01/01/2018 Elsevier Patient Education  2021 [employer]  

## 2020-12-04 ENCOUNTER — Telehealth: Payer: Self-pay | Admitting: Pharmacist

## 2020-12-04 NOTE — Chronic Care Management (AMB) (Signed)
I spoke with the patient about his upcoming appointment on 12/07/2020 @ 9:00 am with the clinical pharmacist. He was asked to please have all medication on hand to review with the pharmacist. He confirmed the appointment.   Maia Breslow, [place] [phone]

## 2020-12-07 ENCOUNTER — Ambulatory Visit (INDEPENDENT_AMBULATORY_CARE_PROVIDER_SITE_OTHER): Payer: Medicare Other | Admitting: Pharmacist

## 2020-12-07 DIAGNOSIS — F5101 Primary insomnia: Secondary | ICD-10-CM

## 2020-12-07 DIAGNOSIS — I10 Essential (primary) hypertension: Secondary | ICD-10-CM | POA: Diagnosis not present

## 2020-12-07 DIAGNOSIS — E785 Hyperlipidemia, unspecified: Secondary | ICD-10-CM | POA: Diagnosis not present

## 2020-12-07 NOTE — Progress Notes (Signed)
Chronic Care Management Pharmacy Note  12/07/2020 Name:  Luis Guzman MRN:  [id number] DOB:  June 13, 1946  Subjective: Luis Guzman is an 75 y.o. year old male who is a primary patient of Burchette, Alinda Sierras, MD.  The CCM team was consulted for assistance with disease management and care coordination needs.    Engaged with patient by telephone for follow up visit in response to provider referral for pharmacy case management and/or care coordination services.   Consent to Services:  The patient was given information about Chronic Care Management services, agreed to services, and gave verbal consent prior to initiation of services.  Please see initial visit note for detailed documentation.   Patient Care Team: Eulas Post, MD as PCP - General Leonie Man, MD as PCP - Cardiology (Cardiology) Kristeen Miss, MD as Consulting Physician (Neurosurgery) Garald Balding, MD as Consulting Physician (Orthopedic Surgery) Viona Gilmore, [facility] as Pharmacist (Pharmacist)  Recent office visits: 12/02/20 Carolann Littler, MD: Patient presented for sinusitis. Prescribed doxycycline and hydrocan.   Recent consult visits: 11/05/20 Fenton Foy, PA ([facility]): Unable to access notes.  10/14/20 Glenetta Hew, MD (cardiology): Patient presented for chest pain and CAD follow up. Plan to decrease aspirin to 81 mg along with Plavix.  09/03/20 Telephone encounter (cardiology): Prescribed amlodipine 2.5 mg daily for elevated BP.  [facility]17/20 [facility]: Patient reported Valsartan changed to Losartan 100 mg daily, Repatha changed to Praulent 75 mg (then increased on 08/24/20 to 150 mg every 2 weeks), restarted  Potassium 20 meq.  07/16/20 Gaynelle Arabian (orthopedic surgery): Unable to access notes.   Hospital visits: 09/30/20 Patient admitted for knee replacement.  Objective:  Lab Results  Component Value Date   CREATININE 0.92 10/01/2020   BUN 15 10/01/2020   GFR 88.33 05/06/2019   GFRNONAA >60  10/01/2020   GFRAA 77 09/02/2020   NA 138 10/01/2020   K 3.8 10/01/2020   CALCIUM 8.5 (L) 10/01/2020   CO2 22 10/01/2020   GLUCOSE 175 (H) 10/01/2020    Lab Results  Component Value Date/Time   HGBA1C 5.8 (H) 05/06/2020 08:42 AM   HGBA1C 5.9 (H) 02/24/2020 09:01 AM   GFR 88.33 05/06/2019 09:07 AM   GFR 96.77 05/02/2018 08:26 AM   MICROALBUR 1.2 08/19/2016 08:38 AM    Last diabetic Eye exam: No results found for: HMDIABEYEEXA  Last diabetic Foot exam: No results found for: HMDIABFOOTEX   Lab Results  Component Value Date   CHOL 164 08/25/2020   HDL 39 (L) 08/25/2020   LDLCALC 89 08/25/2020   LDLDIRECT 129.0 08/19/2016   TRIG 215 (H) 08/25/2020   CHOLHDL 4.2 08/25/2020    Hepatic Function Latest Ref Rng & Units 09/30/2020 09/07/2020 08/25/2020  Total Protein 6.5 - 8.1 g/dL 7.4 6.7 6.8  Albumin 3.5 - 5.0 g/dL 4.6 4.1 4.6  AST 15 - 41 U/L 22 25 18   ALT 0 - 44 U/L 19 21 16   Alk Phosphatase 38 - 126 U/L 94 75 113  Total Bilirubin 0.3 - 1.2 mg/dL 1.0 0.6 0.4  Bilirubin, Direct 0.0 - 0.2 mg/dL - - -    Lab Results  Component Value Date/Time   TSH 0.61 05/06/2019 09:07 AM   TSH 0.79 08/19/2016 08:38 AM    CBC Latest Ref Rng & Units 10/01/2020 09/30/2020 09/07/2020  WBC 4.0 - 10.5 K/uL 20.9(H) 8.9 8.3  Hemoglobin 13.0 - 17.0 g/dL 12.7(L) 16.5 15.8  Hematocrit 39.0 - 52.0 % 36.6(L) 47.7 46.8  Platelets 150 -  400 K/uL 206 206 221    No results found for: VD25OH  Clinical ASCVD: Yes  The 10-year ASCVD risk score Mikey Bussing DC Jr., et al., 2013) is: 59.7%   Values used to calculate the score:     Age: 63 years     Sex: Male     Is Non-Hispanic African American: No     Diabetic: Yes     Tobacco smoker: Yes     Systolic Blood Pressure: 694 mmHg     Is BP treated: Yes     HDL Cholesterol: 39 mg/dL     Total Cholesterol: 164 mg/dL    Depression screen [facility] 2/9 05/15/2020 05/06/2020 05/06/2019  Decreased Interest 0 0 0  Down, Depressed, Hopeless 0 0 0  PHQ - 2 Score 0 0 0   Altered sleeping 0 2 0  Tired, decreased energy 0 2 0  Change in appetite 0 0 0  Feeling bad or failure about yourself  0 0 0  Trouble concentrating 0 0 0  Moving slowly or fidgety/restless 0 0 0  Suicidal thoughts 0 0 0  PHQ-9 Score 0 4 0  Difficult doing work/chores Not difficult at all Not difficult at all Not difficult at all      Social History   Tobacco Use  Smoking Status Former Smoker  . Packs/day: 1.00  . Years: 5.00  . Pack years: 5.00  . Types: Cigarettes  . Quit date: 10/17/1968  . Years since quitting: 52.1  Smokeless Tobacco Never Used   BP Readings from Last 3 Encounters:  12/02/20 (!) 150/80  10/14/20 122/66  10/01/20 124/65   Pulse Readings from Last 3 Encounters:  12/02/20 72  10/14/20 87  10/01/20 92   Wt Readings from Last 3 Encounters:  12/02/20 191 lb 11.2 oz (87 kg)  10/14/20 190 lb 12.8 oz (86.5 kg)  09/30/20 193 lb 3 oz (87.6 kg)   BMI Readings from Last 3 Encounters:  12/02/20 25.29 kg/m  10/14/20 25.17 kg/m  09/30/20 26.20 kg/m    Assessment/Interventions: Review of patient past medical history, allergies, medications, health status, including review of consultants reports, laboratory and other test data, was performed as part of comprehensive evaluation and provision of chronic care management services.   SDOH:  (Social Determinants of Health) assessments and interventions performed: No  SDOH Screenings   Alcohol Screen: Low Risk   . Last Alcohol Screening Score (AUDIT): 0  Depression (PHQ2-9): Low Risk   . PHQ-2 Score: 0  Financial Resource Strain: Low Risk   . Difficulty of Paying Living Expenses: Not hard at all  Food Insecurity: No Food Insecurity  . Worried About Charity fundraiser in the Last Year: Never true  . Ran Out of Food in the Last Year: Never true  Housing: Low Risk   . Last Housing Risk Score: 0  Physical Activity: Sufficiently Active  . Days of Exercise per Week: 7 days  . Minutes of Exercise per Session:  30 min  Social Connections: Moderately Integrated  . Frequency of Communication with Friends and Family: More than three times a week  . Frequency of Social Gatherings with Friends and Family: More than three times a week  . Attends Religious Services: More than 4 times per year  . Active Member of Clubs or Organizations: No  . Attends Archivist Meetings: Never  . Marital Status: Married  Stress: No Stress Concern Present  . Feeling of Stress : Not at all  Tobacco  Use: Medium Risk  . Smoking Tobacco Use: Former Smoker  . Smokeless Tobacco Use: Never Used  Transportation Needs: No Transportation Needs  . Lack of Transportation (Medical): No  . Lack of Transportation (Non-Medical): No    CCM Care Plan  Allergies  Allergen Reactions  . Penicillins Itching    Has patient had a PCN reaction causing immediate rash, facial/tongue/throat swelling, SOB or lightheadedness with hypotension: no Has patient had a PCN reaction causing severe rash involving mucus membranes or skin necrosis: unkn Has patient had a PCN reaction that required hospitalization: no Has patient had a PCN reaction occurring within the last 10 years: no If all of the above answers are "NO", then may proceed with Cephalosporin use.  Tolerated Cephalosporin Date: 10/01/20.    Medications Reviewed Today    Reviewed by Rebecca Eaton, [place] (Certified Medical Assistant) on 12/02/20 at [id number]  Med List Status: <None>  Medication Order Taking? Sig Documenting Provider Last Dose Status Informant  Alirocumab (PRALUENT) 150 MG/ML SOAJ [id number] Yes Inject 150 mg/mL into the skin every 14 (fourteen) days. [provider] Taking Active Self  amLODipine (NORVASC) 2.5 MG tablet [id number] Yes Take 1 tablet (2.5 mg total) by mouth daily. Leonie Man, MD Taking Active   bimatoprost (LUMIGAN) 0.03 % ophthalmic solution [id number] Yes Place 1 drop into both eyes at bedtime. [provider] Taking Active  Self  Brinzolamide-Brimonidine (SIMBRINZA) 1-0.2 % SUSP [id number] Yes Place 1 drop into both eyes in the morning and at bedtime. [provider] Taking Active Self  clopidogrel (PLAVIX) 75 MG tablet [id number] Yes Take 1 tablet (75 mg total) by mouth daily. Leonie Man, MD Taking Active Self  HYDROcodone-acetaminophen (NORCO/VICODIN) 5-325 MG tablet [id number] Yes Take 1-2 tablets by mouth every 6 (six) hours as needed for moderate pain or severe pain. Edmisten, Ok Anis, PA Taking Active   isosorbide mononitrate (IMDUR) 30 MG 24 hr tablet [id number] Yes Take 1 tablet (30 mg total) by mouth daily. Edmisten, Ok Anis, PA Taking Active   losartan (COZAAR) 100 MG tablet [id number] Yes Take 100 mg by mouth daily. [provider] Taking Active Self  methocarbamol (ROBAXIN) 500 MG tablet [id number] Yes Take 1 tablet (500 mg total) by mouth every 6 (six) hours as needed for muscle spasms. Edmisten, Ok Anis, PA Taking Active   nitroGLYCERIN (NITROSTAT) 0.4 MG SL tablet [id number] Yes Place 1 tablet (0.4 mg total) under the tongue every 5 (five) minutes as needed for chest pain. Leonie Man, MD Taking Active Self  traMADol Veatrice [place]bon) 50 MG tablet [id number] Yes Take 1-2 tablets (50-100 mg total) by mouth every 6 (six) hours as needed for moderate pain. Edmisten, Ok Anis, PA Taking Active   traZODone (DESYREL) 50 MG tablet [id number] Yes Take 0.5-1 tablets (25-50 mg total) by mouth at bedtime as needed for sleep.  Patient taking differently: Take 50 mg by mouth at bedtime.   Eulas Post, MD Taking Active           Patient Active Problem List   Diagnosis Date Noted  . Chest pain of uncertain etiology   . Primary osteoarthritis of right hip 09/30/2020  . OA (osteoarthritis) of hip 03/04/2020  . DOE (dyspnea on exertion) 02/13/2020  . Pain in left knee 10/30/2019  . Fatigue due to treatment 07/18/2019  . Coronary artery disease involving native coronary artery of native  heart with angina pectoris ([place]) 06/28/2019  . Preop cardiovascular exam 05/31/2019  . Hypokalemia 02/27/2019  .  CAD S/P & DES PCI -ostial PDA and D1 02/26/2019  . Angina, class III ([place]) 02/14/2019  . Cervical spondylosis with myelopathy 07/30/2018  . AAA (abdominal aortic aneurysm) without rupture ([place]) 04/15/2016  . Biliary dyskinesia 04/08/2014  . Abdominal pain, epigastric 04/08/2014  . Pancreatitis, acute 03/12/2014  . Pancreatitis 03/12/2014  . Hyperglycemia 06/25/2012  . ACUTE BRONCHITIS 03/11/2010  . 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 SINUSITIS, ACUTE 06/11/2009  . Hyperlipidemia with target LDL less than 70 - statin intolerant 01/26/2009  . Essential hypertension 01/26/2009    Immunization History  Administered Date(s) Administered  . Fluad Quad(high Dose 65+) 05/06/2019, 05/06/2020  . Influenza Split 05/15/2012  . Influenza Whole 06/15/2010  . Influenza, High Dose Seasonal PF 04/15/2016, 04/18/2017, 05/02/2018  . Influenza,inj,Quad PF,6+ Mos 08/02/2013, 05/05/2014  . Influenza,inj,quad, With Preservative 05/16/2019  . Influenza-Unspecified 07/30/2015  . PFIZER(Purple Top)SARS-COV-2 Vaccination 08/30/2019, 09/20/2019, 06/13/2020  . Pneumococcal Conjugate-13 09/27/2013  . Pneumococcal Polysaccharide-23 09/15/2008, 04/16/2015  . Td 08/16/2003  . Tdap 05/05/2014  . Zoster 06/17/2011  . Zoster Recombinat (Shingrix) 10/14/2018, 10/14/2018, 03/20/2019, 03/20/2019    Conditions to be addressed/monitored:  Hypertension, Hyperlipidemia, Coronary Artery Disease and Insomnia, Glaucoma  There are no care plans that you recently modified to display for this patient.   Medication Assistance: None required.  Patient affirms current coverage meets needs.  Patient's preferred pharmacy is:  [facility], [address], [address] [place], [place] [address] Phone: [phone] Fax:  [phone]  [id number] [address] [place]Phone: [phone]: [name]Phone: [phone]: [phone] Uses pill box? No - spice rack in his cabinet Pt endorses 100% compliance  We discussed: Current pharmacy is preferred with insurance plan and patient is satisfied with pharmacy services Patient decided to: Continue current medication management strategy  Care Plan and Follow Up Patient Decision:  Patient agrees to Care Plan and Follow-up.  Plan: Telephone follow up appointment with care management team member scheduled for:  6 months  MJeni Salles PharmD BSchohariePharmacist LNew Marketat [phone]

## 2020-12-10 ENCOUNTER — Other Ambulatory Visit (HOSPITAL_COMMUNITY): Payer: Self-pay

## 2020-12-10 ENCOUNTER — Ambulatory Visit (INDEPENDENT_AMBULATORY_CARE_PROVIDER_SITE_OTHER): Payer: Medicare Other | Admitting: Cardiology

## 2020-12-10 ENCOUNTER — Other Ambulatory Visit (HOSPITAL_BASED_OUTPATIENT_CLINIC_OR_DEPARTMENT_OTHER): Payer: Self-pay

## 2020-12-10 ENCOUNTER — Ambulatory Visit: Payer: Medicare Other | Attending: Internal Medicine

## 2020-12-10 ENCOUNTER — Encounter: Payer: Self-pay | Admitting: Cardiology

## 2020-12-10 ENCOUNTER — Other Ambulatory Visit: Payer: Self-pay

## 2020-12-10 VITALS — BP 144/82 | HR 70 | Ht 72.0 in | Wt 192.0 lb

## 2020-12-10 DIAGNOSIS — Z23 Encounter for immunization: Secondary | ICD-10-CM

## 2020-12-10 DIAGNOSIS — I209 Angina pectoris, unspecified: Secondary | ICD-10-CM | POA: Diagnosis not present

## 2020-12-10 DIAGNOSIS — T466X5A Adverse effect of antihyperlipidemic and antiarteriosclerotic drugs, initial encounter: Secondary | ICD-10-CM

## 2020-12-10 DIAGNOSIS — I25119 Atherosclerotic heart disease of native coronary artery with unspecified angina pectoris: Secondary | ICD-10-CM

## 2020-12-10 DIAGNOSIS — Z9861 Coronary angioplasty status: Secondary | ICD-10-CM

## 2020-12-10 DIAGNOSIS — M791 Myalgia, unspecified site: Secondary | ICD-10-CM | POA: Diagnosis not present

## 2020-12-10 DIAGNOSIS — R5383 Other fatigue: Secondary | ICD-10-CM | POA: Diagnosis not present

## 2020-12-10 DIAGNOSIS — I10 Essential (primary) hypertension: Secondary | ICD-10-CM

## 2020-12-10 DIAGNOSIS — E785 Hyperlipidemia, unspecified: Secondary | ICD-10-CM

## 2020-12-10 DIAGNOSIS — I2510 Atherosclerotic heart disease of native coronary artery without angina pectoris: Secondary | ICD-10-CM | POA: Diagnosis not present

## 2020-12-10 MED ORDER — PFIZER-BIONT COVID-19 VAC-TRIS 30 MCG/0.3ML IM SUSP
INTRAMUSCULAR | 0 refills | Status: DC
  Filled 2020-12-10: qty 0.3, 1d supply, fill #0

## 2020-12-10 MED ORDER — ASPIRIN EC 81 MG PO TBEC: 81.0000 mg | DELAYED_RELEASE_TABLET | Freq: Every day | ORAL | 3 refills | Status: AC

## 2020-12-10 MED ORDER — NITROGLYCERIN 0.4 MG SL SUBL: 0.4000 mg | SUBLINGUAL_TABLET | SUBLINGUAL | 3 refills | Status: DC | PRN

## 2020-12-10 NOTE — Assessment & Plan Note (Signed)
Three-vessel disease with PCI to the diagonal and PDA with medical management of moderate lesion in the circumflex.  Plan: Okay to DC Plavix and continue with aspirin. Continue losartan. Not on beta-blocker because of fatigue.  We are using amlodipine for antianginal benefit, blood pressure.  Anticipate potentially titrating up to 5 mg daily. Not on statin because of intolerance-on Praluent.

## 2020-12-10 NOTE — Assessment & Plan Note (Signed)
Thought related to beta-blocker and component of statin.  Doing better off both classes of medications.

## 2020-12-10 NOTE — Assessment & Plan Note (Signed)
Just about 1-1/2 years out from his second PCI. Plan: Okay to DC Plavix at the end of current bottle and go back to aspirin daily.  Okay to hold aspirin for procedures or surgeries.  Continue amlodipine and Imdur for antianginal benefit. On stable dose of ARB. On Praluent

## 2020-12-10 NOTE — Assessment & Plan Note (Addendum)
Blood pressures are trending up, which may simply be related to his ongoing sinus infection.  I have asked him to monitor his pressures as he gets better.  I am leery of increasing his amlodipine dose to 5 mg, but that would be the next.  Hopefully if he is splitting up his amlodipine and losartan doses he will do better.  Maintain blood pressure log, looking for average rates less than 135/85 average.  Continue Imdur

## 2020-12-10 NOTE — Assessment & Plan Note (Signed)
Was tried on multiple different statin medications.  Unable to tolerate because of myalgias and fatigue.  Now switched from [place] to [address].  Dose was increased to 150 mg. Due for follow-up labs now.

## 2020-12-10 NOTE — Progress Notes (Signed)
Primary Care Provider: Eulas Post, MD Cardiologist: Glenetta Hew, MD Electrophysiologist: None  Clinic Note: Chief Complaint  Patient presents with  . Follow-up    1 month to reassess postop chest pain  . Coronary Artery Disease    No further chest pain  . Hyperlipidemia   ===================================  ASSESSMENT/PLAN   Problem List Items Addressed This Visit    CAD S/P & DES PCI -ostial PDA and D1 (Chronic)    Just about 1-1/2 years out from his second PCI. Plan: Okay to DC Plavix at the end of current bottle and go back to aspirin daily.  Okay to hold aspirin for procedures or surgeries.  Continue amlodipine and Imdur for antianginal benefit. On stable dose of ARB. On Praluent      Relevant Medications   aspirin EC 81 MG tablet   nitroGLYCERIN (NITROSTAT) 0.4 MG SL tablet   Other Relevant Orders   Lipid panel (Completed)   Comprehensive metabolic panel (Completed)   Lipid panel   Comprehensive metabolic panel   Coronary artery disease involving native coronary artery of native heart with angina pectoris ([place]) - Primary (Chronic)    Three-vessel disease with PCI to the diagonal and PDA with medical management of moderate lesion in the circumflex.  Plan: Okay to DC Plavix and continue with aspirin. Continue losartan. Not on beta-blocker because of fatigue.  We are using amlodipine for antianginal benefit, blood pressure.  Anticipate potentially titrating up to 5 mg daily. Not on statin because of intolerance-on Praluent.      Relevant Medications   aspirin EC 81 MG tablet   nitroGLYCERIN (NITROSTAT) 0.4 MG SL tablet   Other Relevant Orders   Lipid panel (Completed)   Comprehensive metabolic panel (Completed)   Lipid panel   Comprehensive metabolic panel   Hyperlipidemia with target LDL less than 70 - statin intolerant (Chronic)    Last LDL was 89 in January-converted to Praluent.  Plan: Recheck lipids in the month of May, and then again  prior to follow-up in October.  Anticipate we may need to consider Nexletol or Nexlizet if not at goal.      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  Unable to tolerate because of myalgias and fatigue.  Now switched from [place] to [place].  Dose was increased to 150 mg. Due for follow-up labs now.      Essential hypertension (Chronic)    Blood pressures are trending up, which may simply be related to his ongoing sinus infection.  I have asked him to monitor his pressures as he gets better.  I am leery of increasing his amlodipine dose to 5 mg, but that would be the next.  Hopefully if he is splitting up his amlodipine and losartan doses he will do better.  Maintain blood pressure log, looking for average rates less than 135/85 average.  Continue Imdur      Relevant Medications   aspirin EC 81 MG tablet   nitroGLYCERIN (NITROSTAT) 0.4 MG SL tablet   Fatigue due to treatment (Chronic)    Thought related to beta-blocker and component of statin.  Doing better off both classes of medications.         ===================================  HPI:    Luis Guzman is a 75 y.o. male with a PMH notable for CAD-PCI to [place] and PDA who presents today for post hospital follow-up after right hip replacement surgery -> noted having an episode of chest pain on postop day 1.[name]  EESHAN VERBRUGGE was last seen on October 14, 2020 for a work in visit as a hospital follow-up.  He had 2 episodes of chest pain postoperatively after his right hip arthroplasty.  No further symptoms since then.  Negative troponin.  Echocardiogram was normal.  Started on Imdur.  When I saw him, he denied any further episodes of chest pain, is doing more  activity than he had been before.  We chose to continue to treat medically with Imdur and added amlodipine 2.5 mg.  He had notably just increase his dose of Praluent in January for elevated lipids.  Recent Hospitalizations:   N/A  Reviewed  CV studies:    The following studies were reviewed today: (if available, images/films reviewed: From Epic Chart or Care Everywhere)  N/A  Interval History:   [name] returns today feeling doing well.  He has not had any further episodes of chest discomfort since hospitalization, in fact he is more active now, and doing well.  He has been noticing some fatigue and dizziness near the midday when he was taking his amlodipine dose in the morning.  He switched to p.m. and since then has been doing much better.  He has noted his blood pressures been up for the last couple weeks, he was attributing that to being on Sudafed, but has been in a week off and still still has somewhat elevated pressures.  Enjoying having less pain with walking.  He does have some pain in the right hip because he sleeps on that side, but is otherwise doing great.  Becoming more active now.  Currently dealing with a bout of acute sinusitis which began as an allergy exacerbation.  Otherwise he is doing very well.  He has his chronic loose stools but no major issues there.  Still notes pretty significant bruising and asked about potentially coming off of Plavix.  CV Review of Symptoms (Summary) Cardiovascular ROS: positive for - Had some mild fatigue and dizziness by midday when he was taking amlodipine in the morning.  Notably improved when he switched to p.m. negative for - chest pain, dyspnea on exertion, edema, irregular heartbeat, orthopnea, palpitations, paroxysmal nocturnal dyspnea, rapid heart rate, shortness of breath or Syncope/near syncope or TIA/amaurosis fugax, claudication  The patient DOES NOT have symptoms concerning for COVID-19 infection (fever, chills, cough, or new  shortness of breath).   REVIEWED OF SYSTEMS   Review of Systems  Constitutional: Negative for malaise/fatigue (Energy notably improved.) and weight loss.  HENT: Positive for congestion and sinus pain. Negative for nosebleeds.        Currently completing a course of antibiotics for sinusitis  Respiratory: Negative for cough and shortness of breath.   Cardiovascular: Negative for chest pain (See HPI).  Gastrointestinal: Negative for blood in stool, diarrhea, heartburn and melena.       Chronic loose stools  Genitourinary: Negative for hematuria.  Musculoskeletal: Positive for joint pain (Still has little pain on the right hip because he sleeps on his side.[name]).  Neurological: Positive for dizziness (See above). Negative for weakness.  Endo/Heme/Allergies: Positive for environmental allergies. Bruises/bleeds easily (Better with reduced dose of aspirin).  Psychiatric/Behavioral: Negative for memory loss. The patient is not nervous/anxious and  does not have insomnia.    I have reviewed and (if needed) personally updated the patient's problem list, medications, allergies, past medical and surgical history, social and family history.   PAST MEDICAL HISTORY   Past Medical History:  Diagnosis Date  . AAA (abdominal aortic aneurysm) ([place])   . Anemia    as infant history of  . CAD S/P PTCA & DES PCI - for Progressive Angina 02/26/2019   Cath-PCI 02/26/2019: EF 55-65%. mCx 65% (FFR 0.82 - Med Rx). D2 85% - Wolverine Scoring Balloon PTCA (2.0 mm). Ost rPDA - 90% -Resolute Onyx 2.5 x 15 (2.6 mm). --> 06/2019: staged DES PCI ost D1 (RESOLUTE ONYX 2.25 mm x 50 mm (2.6 mm) positioned to avoid the true ostium)  . Elevated PSA   . GERD (gastroesophageal reflux disease)   . History of diabetes mellitus, type II    loss weight under control currently  . History of hiatal hernia    40 yrs ago  . History of pancreatitis    elevated lipase levels   . HYPERLIPIDEMIA 01/26/2009  . HYPERTENSION 01/26/2009  .  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 lower back pain, s/p cervical neck surgery"stiffness" remains  . Transfusion history    infant "anemia"    PAST SURGICAL HISTORY   Past Surgical History:  Procedure Laterality Date  . APPENDECTOMY  ~ 1959  . BACK SURGERY    . CERVICAL DISC ARTHROPLASTY N/A 07/30/2018   Procedure: Cervical five-six Cervical six-seven Artificial disc replacement;  Surgeon: Kristeen Miss, MD;  Location: [facility];  Service: Neurosurgery;  Laterality: N/A;  . CERVICAL LAMINECTOMY  1997   C 4 and C 5  . CHOLECYSTECTOMY N/A 04/15/2014   Procedure: LAPAROSCOPIC CHOLECYSTECTOMY WITH INTRAOPERATIVE CHOLANGIOGRAM;  Surgeon: Armandina Gemma, MD;  Location: [facility];  Service: General;  Laterality: N/A;  . COLONOSCOPY    . CORONARY BALLOON ANGIOPLASTY N/A 02/26/2019   Procedure: CORONARY BALLOON ANGIOPLASTY;  Surgeon: Leonie Man, MD;  Location: [facility];  Service: Cardiovascular -> scoring balloon PTCA (Wolverine 2.0 mm) of ostial D2 85% reducing to 20-30%.  . CORONARY STENT INTERVENTION N/A 02/26/2019   Procedure: CORONARY STENT INTERVENTION;  Surgeon: Leonie Man, MD;  Location: [facility];; PCI ostial RPDA (initial attempt of PTCA only led to small local tear/dissection covered with stent) Resolute Onyx 2.5 mm x 15 mm (2.6 mm  . CORONARY STENT INTERVENTION N/A 07/05/2019   Procedure: CORONARY STENT INTERVENTION;  Surgeon: Leonie Man, MD;  Location: [facility];  Service: Cardiovascular;; ostial D1 95% (restenosis of PTCA site) -> DES PCI RESOLUTE ONYX 2.25 mm x 50 mm (2.6 mm) positioned to avoid the true ostium.   . ESOPHAGOGASTRODUODENOSCOPY N/A 03/13/2014   Procedure: ESOPHAGOGASTRODUODENOSCOPY (EGD);  Surgeon: Wonda Horner, MD;  Location: [facility];  Service: Endoscopy;  Laterality: N/A;  . INTRAVASCULAR PRESSURE WIRE/FFR STUDY N/A 02/26/2019   Procedure: INTRAVASCULAR PRESSURE WIRE/FFR STUDY;  Surgeon: Leonie Man,  MD;  Location: [facility];  Service: Cardiovascular;; mCx ~65% - DFR 0.92, FR 0.82 - BORDERLINE--> MED Rx  . LEFT HEART CATH AND CORONARY ANGIOGRAPHY N/A 02/26/2019   Procedure: LEFT HEART CATH AND CORONARY ANGIOGRAPHY;  Surgeon: Leonie Man, MD;  Location: [facility];  EF 55-65%. mCx 65% (FFR 0.82 - Med Rx). D2 85% - Wolverine Scoring Balloon PTCA (2.0 mm). Ost rPDA - 90% -Resolute Onyx 2.5 x 15 (2.6 mm).   [name]  LEFT HEART CATH AND CORONARY ANGIOGRAPHY N/A 07/05/2019   Procedure: LEFT HEART CATH AND CORONARY ANGIOGRAPHY;  Surgeon: Leonie Man, MD;  Location: [facility];; ostial D1 95% (restenosis of PTCA site) -> DES PCI.  PDA stent widely patent.  Mid CX stable 65% lesion.  Proximal RCA 40%.  Mid RCA 45%.  Normal EDP.  [name] 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  . SHOULDER ARTHROSCOPY Bilateral   . SHOULDER ARTHROSCOPY W/ ROTATOR CUFF REPAIR Bilateral 2004-2009   left 2004, right 2009  . TOTAL HIP ARTHROPLASTY Left 03/04/2020   Procedure: TOTAL HIP ARTHROPLASTY ANTERIOR APPROACH;  Surgeon: Gaynelle Arabian, MD;  Location: [facility];  Service: Orthopedics;  Laterality: Left;  169min  . TOTAL HIP ARTHROPLASTY Right 09/30/2020   Procedure: TOTAL HIP ARTHROPLASTY ANTERIOR APPROACH;  Surgeon: Gaynelle Arabian, MD;  Location: [facility];  Service: Orthopedics;  Laterality: Right;  148min  . TRANSTHORACIC ECHOCARDIOGRAM  02/2020; 10/01/2020   a) EF 55 to 60%.  GR 1 DD.  No R WMA.  Trivial MR.  Otherwise normal.; b) EF 55 to 60%.  Normal LV function and no wall motion abnormality.  Normal RV.  Normal valves..  . VASECTOMY    . WISDOM TOOTH EXTRACTION       Cath-PCI 07/05/2019:ostial D1 95% (restenosis of PTCA site)-> DES PCIRESOLUTE ONYX 2.25 mm x 50 mm (2.6 mm) positioned to avoid the true ostium.PDA stent widely patent. Mid CX stable 65% lesion. Proximal RCA 40%. Mid RCA 45%. Normal EDP.    Immunization History  Administered Date(s) Administered  .  Fluad Quad(high Dose 65+) 05/06/2019, 05/06/2020  . Influenza Split 05/15/2012  . Influenza Whole 06/15/2010  . Influenza, High Dose Seasonal PF 04/15/2016, 04/18/2017, 05/02/2018  . Influenza,inj,Quad PF,6+ Mos 08/02/2013, 05/05/2014  . Influenza,inj,quad, With Preservative 05/16/2019  . Influenza-Unspecified 07/30/2015  . PFIZER Comirnaty(Gray Top)Covid-19 Tri-Sucrose Vaccine 12/10/2020  . PFIZER(Purple Top)SARS-COV-2 Vaccination 08/30/2019, 09/20/2019, 06/13/2020  . Pneumococcal Conjugate-13 09/27/2013  . Pneumococcal Polysaccharide-23 09/15/2008, 04/16/2015  . Td 08/16/2003  . Tdap 05/05/2014  . Zoster 06/17/2011  . Zoster Recombinat (Shingrix) 10/14/2018, 10/14/2018, 03/20/2019, 03/20/2019    MEDICATIONS/ALLERGIES   Current Meds  Medication Sig  . acetaminophen (TYLENOL) 500 MG tablet TAKE TWO TABLETS BY MOUTH THREE TIMES A DAY AS NEEDED  . Alirocumab (PRALUENT) 150 MG/ML SOAJ Inject 150 mg/mL into the skin every 14 (fourteen) days.  [name] aspirin EC 81 MG tablet Take 1 tablet (81 mg total) by mouth daily. Swallow whole. ( start after stopping clopidogrel 75 mg)  . bimatoprost (LUMIGAN) 0.03 % ophthalmic solution Place 1 drop into both eyes at bedtime.  . Brinzolamide-Brimonidine (SIMBRINZA) 1-0.2 % SUSP Place 1 drop into both eyes in the morning and at bedtime.  . isosorbide mononitrate (IMDUR) 30 MG 24 hr tablet Take 1 tablet (30 mg total) by mouth daily.  [name] losartan (COZAAR) 100 MG tablet Take 100 mg by mouth daily.  . traZODone (DESYREL) 50 MG tablet Take 50 mg by mouth at bedtime.  . [DISCONTINUED] amLODipine (NORVASC) 2.5 MG tablet Take 1 tablet (2.5 mg total) by mouth daily.  . [DISCONTINUED] clopidogrel (PLAVIX) 75 MG tablet Take 1 tablet (75 mg total) by mouth daily.  . [DISCONTINUED] doxycycline (VIBRA-TABS) 100 MG tablet Take 1 tablet (100 mg total) by mouth 2 (two) times daily.  . [DISCONTINUED] nitroGLYCERIN (NITROSTAT) 0.4 MG SL tablet Place 1 tablet (0.4 mg total)  under the tongue every 5 (five) minutes as needed for chest pain.  . [  DISCONTINUED] traZODone (DESYREL) 50 MG tablet Take 0.5-1 tablets (25-50 mg total) by mouth at bedtime as needed for sleep. (Patient taking differently: Take 50 mg by mouth at bedtime.)  Switch dosing of amlodipine to p.m.  Allergies  Allergen Reactions  . Penicillins Itching    Has patient had a PCN reaction causing immediate rash, facial/tongue/throat swelling, SOB or lightheadedness with hypotension: no Has patient had a PCN reaction causing severe rash involving mucus membranes or skin necrosis: unkn Has patient had a PCN reaction that required hospitalization: no Has patient had a PCN reaction occurring within the last 10 years: no If all of the above answers are "NO", then may proceed with Cephalosporin use.  Tolerated Cephalosporin Date: 10/01/20.    SOCIAL HISTORY/FAMILY HISTORY   Reviewed in Epic:  Pertinent findings:  Social History   Tobacco Use  . Smoking status: Former Smoker    Packs/day: 1.00    Years: 5.00    Pack years: 5.00    Types: Cigarettes    Quit date: 10/17/1968    Years since quitting: 52.2  . Smokeless tobacco: Never Used  Vaping Use  . Vaping Use: Never used  Substance Use Topics  . Alcohol use: Not Currently    Comment: "quit drinking ~ 10/1968  . Drug use: No   Social History   Social History Narrative  . Not on file    OBJCTIVE -PE, EKG, labs   Wt Readings from Last 3 Encounters:  12/10/20 192 lb (87.1 kg)  12/02/20 191 lb 11.2 oz (87 kg)  10/14/20 190 lb 12.8 oz (86.5 kg)    Physical Exam: BP (!) 144/82   Pulse 70   Ht 6' (1.829 m)   Wt 192 lb (87.1 kg)   SpO2 99%   BMI 26.04 kg/m  Physical Exam Constitutional:      General: He is not in acute distress.    Appearance: Normal appearance. He is not ill-appearing or toxic-appearing.     Comments: Well-groomed.  Healthy-appearing.  HENT:     Head: Normocephalic and atraumatic.  Neck:     Vascular: No  carotid bruit.  Cardiovascular:     Rate and Rhythm: Normal rate and regular rhythm.     Pulses: Normal pulses.     Heart sounds: Normal heart sounds. No murmur heard. No friction rub. No gallop.   Pulmonary:     Effort: Pulmonary effort is normal. No respiratory distress.     Breath sounds: Normal breath sounds.  Chest:     Chest wall: No tenderness.  Musculoskeletal:        General: No swelling. Normal range of motion.     Cervical back: Normal range of motion and neck supple.  Skin:    General: Skin is warm and dry.     Coloration: Skin is not pale.  Neurological:     General: No focal deficit present.     Mental Status: He is alert and oriented to person, place, and time.     Gait: Gait (Walking without a limp) normal.  Psychiatric:        Mood and Affect: Mood normal.        Behavior: Behavior normal.        Thought Content: Thought content normal.        Judgment: Judgment normal.     Adult ECG Report From October 01, 2020: Sinus rhythm, rate 77 bpm.  Normal EKG. Not checked today.  Recent Labs: Reviewed Lab Results  Component  Value Date   CHOL 105 12/11/2020   HDL 44 12/11/2020   LDLCALC 45 12/11/2020   LDLDIRECT 129.0 08/19/2016   TRIG 76 12/11/2020   CHOLHDL 2.4 12/11/2020   Lab Results  Component Value Date   CREATININE 0.85 12/11/2020   BUN 10 12/11/2020   NA 144 12/11/2020   K 3.5 12/11/2020   CL 109 (H) 12/11/2020   CO2 22 12/11/2020   CBC Latest Ref Rng & Units 10/01/2020 09/30/2020 09/07/2020  WBC 4.0 - 10.5 K/uL 20.9(H) 8.9 8.3  Hemoglobin 13.0 - 17.0 g/dL 12.7(L) 16.5 15.8  Hematocrit 39.0 - 52.0 % 36.6(L) 47.7 46.8  Platelets 150 - 400 K/uL 206 206 221    Lab Results  Component Value Date   TSH 0.61 05/06/2019    ==================================================  COVID-19 Education: The signs and symptoms of COVID-19 were discussed with the patient and how to seek care for testing (follow up with PCP or arrange E-visit).   The  importance of social distancing and COVID-19 vaccination was discussed today. The patient is practicing social distancing & Masking.   I spent a total of 33 minutes with the patient spent in direct patient consultation.  Additional time spent with chart review  / charting (studies, outside notes, etc): 5 min Total Time: 38 min   Current medicines are reviewed at length with the patient today.  (+/- concerns) n/a  This visit occurred during the SARS-CoV-2 public health emergency.  Safety protocols were in place, including screening questions prior to the visit, additional usage of staff PPE, and extensive cleaning of exam room while observing appropriate contact time as indicated for disinfecting solutions.  Notice: This dictation was prepared with Dragon dictation along with smaller phrase technology. Any transcriptional errors that result from this process are unintentional and may not be corrected upon review.  Patient Instructions / Medication Changes & Studies & Tests Ordered   Patient Instructions  Medication Instructions:  Once this bottle of Clopidogrel is empty may stop medication completely then start taking Aspirin 81 mg daily   *If you need a refill on your cardiac medications before your next appointment, please call your pharmacy*   Lab Work:  [place]- now fasting  CMP LIPID - Nov 2022 fasting If you have labs (blood work) drawn today and your tests are completely normal, you will receive your results only by: [name] MyChart Message (if you have MyChart) OR . A paper copy in the mail If you have any lab test that is abnormal or we need to change your treatment, we will call you to review the results.   Testing/Procedures:  Not needed  Follow-Up: At [facility], you and your health needs are our priority.  As part of our continuing mission to provide you with exceptional heart care, we have created designated Provider Care Teams.  These Care Teams include your  primary Cardiologist (physician) and Advanced Practice Providers (APPs -  Physician Assistants and Nurse Practitioners) who all work together to provide you with the care you need, when you need it.     Your next appointment:   7 month(s)  The format for your next appointment:   In Person  Provider:   Glenetta Hew, MD   Other Instructions  Monitor blood pressures  - the range should be 135/85 or less    Studies Ordered:   Orders Placed This Encounter  Procedures  . Lipid panel  . Comprehensive metabolic panel  . Lipid panel  . Comprehensive metabolic  panel     Glenetta Hew, M.D., M.S. Interventional Cardiologist   Pager # [phone] Phone # [phone] [address] [place], [place] [address]   Thank you for choosing Heartcare at [facility]!!

## 2020-12-10 NOTE — Progress Notes (Signed)
   Covid-19 Vaccination Clinic  Name:  Luis Guzman    MRN: [id number] DOB: 03/15/1946  12/10/2020  [name] was observed post Covid-19 immunization for 15 minutes without incident. He was provided with Vaccine Information Sheet and instruction to access the V-Safe system.   Mr. Favorite was instructed to call 911 with any severe reactions post vaccine: [name] Difficulty breathing  . Swelling of face and throat  . A fast heartbeat  . A bad rash all over body  . Dizziness and weakness   Immunizations Administered    Name Date Dose VIS Date Route   PFIZER Comrnaty(Gray TOP) Covid-19 Vaccine 12/10/2020 12:44 PM 0.3 mL 07/23/2020 Intramuscular   Manufacturer: [place]   Lot: [id number]   NDC: [phone]

## 2020-12-10 NOTE — Patient Instructions (Addendum)
Medication Instructions:  Once this bottle of Clopidogrel is empty may stop medication completely then start taking Aspirin 81 mg daily   *If you need a refill on your cardiac medications before your next appointment, please call your pharmacy*   Lab Work:  [place]- now fasting  CMP LIPID - Nov 2022 fasting If you have labs (blood work) drawn today and your tests are completely normal, you will receive your results only by: [name] MyChart Message (if you have MyChart) OR . A paper copy in the mail If you have any lab test that is abnormal or we need to change your treatment, we will call you to review the results.   Testing/Procedures:  Not needed  Follow-Up: At [facility], you and your health needs are our priority.  As part of our continuing mission to provide you with exceptional heart care, we have created designated Provider Care Teams.  These Care Teams include your primary Cardiologist (physician) and Advanced Practice Providers (APPs -  Physician Assistants and Nurse Practitioners) who all work together to provide you with the care you need, when you need it.     Your next appointment:   7 month(s)  The format for your next appointment:   In Person  Provider:   Glenetta Hew, MD   Other Instructions  Monitor blood pressures  - the range should be 135/85 or less

## 2020-12-10 NOTE — Patient Instructions (Addendum)
Hi Luis Guzman,  It was great to get to speak with you again! Below is a summary of some of the topics we discussed. I'll let you know what Dr. Elease Hashimoto says about the trazodone. In the meantime, I attached some information about sleep hygiene that may be helpful.  Please reach out to me if you have any questions or need anything before our follow up!  Best, Maddie  Jeni Salles, PharmD, [facility]  Visit Information  Goals Addressed   None    Patient Care Plan: CCM Pharmacy Care Plan    Problem Identified: Problem: Hypertension, Hyperlipidemia, Coronary Artery Disease and Insomnia, Glaucoma     Long-Range Goal: Patient-Specific Goal   Start Date: 12/07/2020  Expected End Date: 12/07/2021  This Visit's Progress: On track  Priority: High  Note:   Current Barriers:  . Unable to independently monitor therapeutic efficacy . Unable to achieve control of cholesterol   Pharmacist Clinical Goal(s):  [name] Patient will achieve adherence to monitoring guidelines and medication adherence to achieve therapeutic efficacy . achieve control of cholesterol as evidenced by next lipid panel  through collaboration with PharmD and provider.   Interventions: . 1:1 collaboration with Eulas Post, MD regarding development and update of comprehensive plan of care as evidenced by provider attestation and co-signature . Inter-disciplinary care team collaboration (see longitudinal plan of care) . Comprehensive medication review performed; medication list updated in electronic medical record  Hypertension (BP goal <130/80) -Uncontrolled (based on office readings) -Current treatment: . Losartan 100 mg 1 tablet daily . Amlodipine 2.5 mg 1 tablet daily -Medications previously tried: HCTZ (kidney function), valsartan (formulary change) -Current home readings: 120-130/70s -Current dietary habits: patient doesn't use much salt and does not  eat frozen or canned foods  -Current exercise habits: was using an elliptical for 30 minutes a day but not right now -Denies hypotensive/hypertensive symptoms -Educated on Exercise goal of 150 minutes per week; Importance of home blood pressure monitoring; Proper BP monitoring technique; -Counseled to monitor BP at home weekly, document, and provide log at future appointments -Counseled on diet and exercise extensively Recommended to continue current medication  Hyperlipidemia: (LDL goal < 70) -Uncontrolled -Current treatment: . Praluent 150 mg/mL inject every 14 days -Medications previously tried: Repatha (formulary change), statins (muscle pain), Zetia -Current dietary patterns: doesn't eat a lot of sweets -Current exercise habits: hasn't been doing much for the last 2 months except PT exercises -Educated on Cholesterol goals;  Importance of limiting foods high in cholesterol; Exercise goal of 150 minutes per week; -Counseled on diet and exercise extensively Recommended to continue current medication  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) -Controlled -Current treatment  . Isosorbide mononitrate 30 mg 1 tablet daily . Clopidogrel 75 mg 1 tablet daily . Nitroglycerin 0.4 mg SL 1 tablet as needed -Medications previously tried: none  -Recommended to continue current medication Patient hasn't had chest pain since the day of surgery  Insomnia (Goal: improve quality and quantity of sleep) -Not ideally controlled -Current treatment  . Trazodone 50 mg 1 tablet at bedtime every night -Medications previously tried: melatonin (ineffective)  -Counseled on practicing good sleep hygiene by setting a sleep schedule and maintaining it, avoid excessive napping, following a nightly routine, avoiding screen time for 30-60 minutes before going to bed, and making the bedroom a cool, quiet and dark space Collaborated with PCP about trial of increasing trazodone   Glaucoma (Goal: lower  intraocular pressure) -Controlled -Current treatment  Simbrinza 1-0.2% 1 drop in both eyes twice daily  Lumigan 0.03% 1 drop in both eyes at bedtime -Medications previously tried: none  -Recommended to continue current medication   Health Maintenance -Vaccine gaps: none -Current therapy:  . Tylenol 500 mg - taking 2 daily - depends on the need -Educated on Cost vs benefit of each product must be carefully weighed by individual consumer -Patient is satisfied with current therapy and denies issues -Recommended to continue current medication  Patient Goals/Self-Care Activities . Patient will:  - check blood pressure weekly, document, and provide at future appointments target a minimum of 150 minutes of moderate intensity exercise weekly  Follow Up Plan: Telephone follow up appointment with care management team member scheduled for: 6 months       Patient verbalizes understanding of instructions provided today and agrees to view in [place].  Telephone follow up appointment with pharmacy team member scheduled for:  Viona Gilmore, [facility]  Insomnia Insomnia is a sleep disorder that makes it difficult to fall asleep or stay asleep. Insomnia can cause fatigue, low energy, difficulty concentrating, mood swings, and poor performance at work or school. There are three different ways to classify insomnia:  Difficulty falling asleep.  Difficulty staying asleep.  Waking up too early in the morning. Any type of insomnia can be long-term (chronic) or short-term (acute). Both are common. Short-term insomnia usually lasts for three months or less. Chronic insomnia occurs at least three times a week for longer than three months. What are the causes? Insomnia may be caused by another condition, situation, or substance, such as:  Anxiety.  Certain medicines.  Gastroesophageal reflux disease (GERD) or other gastrointestinal conditions.  Asthma or other breathing conditions.  Restless  legs syndrome, sleep apnea, or other sleep disorders.  Chronic pain.  Menopause.  Stroke.  Abuse of alcohol, tobacco, or illegal drugs.  Mental health conditions, such as depression.  Caffeine.  Neurological disorders, such as Alzheimer's disease.  An overactive thyroid (hyperthyroidism). Sometimes, the cause of insomnia may not be known. What increases the risk? Risk factors for insomnia include:  Gender. Women are affected more often than men.  Age. Insomnia is more common as you get older.  Stress.  Lack of exercise.  Irregular work schedule or working night shifts.  Traveling between different time zones.  Certain medical and mental health conditions. What are the signs or symptoms? If you have insomnia, the main symptom is having trouble falling asleep or having trouble staying asleep. This may lead to other symptoms, such as:  Feeling fatigued or having low energy.  Feeling nervous about going to sleep.  Not feeling rested in the morning.  Having trouble concentrating.  Feeling irritable, anxious, or depressed. How is this diagnosed? This condition may be diagnosed based on:  Your symptoms and medical history. Your health care provider may ask about: ? Your sleep habits. ? Any medical conditions you have. ? Your mental health.  A physical exam. How is this treated? Treatment for insomnia depends on the cause. Treatment may focus on treating an underlying condition that is causing insomnia. Treatment may also include:  Medicines to help you sleep.  Counseling or therapy.  Lifestyle adjustments to help you sleep better. Follow these instructions at home: Eating and drinking  Limit or avoid alcohol, caffeinated beverages, and cigarettes, especially close to bedtime. These can disrupt your sleep.  Do not eat a large meal or eat spicy foods right before bedtime. This can lead to digestive discomfort  that can make it hard for you to sleep.   Sleep  habits  Keep a sleep diary to help you and your health care provider figure out what could be causing your insomnia. Write down: ? When you sleep. ? When you wake up during the night. ? How well you sleep. ? How rested you feel the next day. ? Any side effects of medicines you are taking. ? What you eat and drink.  Make your bedroom a dark, comfortable place where it is easy to fall asleep. ? Put up shades or blackout curtains to block light from outside. ? Use a white noise machine to block noise. ? Keep the temperature cool.  Limit screen use before bedtime. This includes: ? Watching TV. ? Using your smartphone, tablet, or computer.  Stick to a routine that includes going to bed and waking up at the same times every day and night. This can help you fall asleep faster. Consider making a quiet activity, such as reading, part of your nighttime routine.  Try to avoid taking naps during the day so that you sleep better at night.  Get out of bed if you are still awake after 15 minutes of trying to sleep. Keep the lights down, but try reading or doing a quiet activity. When you feel sleepy, go back to bed.   General instructions  Take over-the-counter and prescription medicines only as told by your health care provider.  Exercise regularly, as told by your health care provider. Avoid exercise starting several hours before bedtime.  Use relaxation techniques to manage stress. Ask your health care provider to suggest some techniques that may work well for you. These may include: ? Breathing exercises. ? Routines to release muscle tension. ? Visualizing peaceful scenes.  Make sure that you drive carefully. Avoid driving if you feel very sleepy.  Keep all follow-up visits as told by your health care provider. This is important. Contact a health care provider if:  You are tired throughout the day.  You have trouble in your daily routine due to sleepiness.  You continue to have sleep  problems, or your sleep problems get worse. Get help right away if:  You have serious thoughts about hurting yourself or someone else. If you ever feel like you may hurt yourself or others, or have thoughts about taking your own life, get help right away. You can go to your nearest emergency department or call:  Your local emergency services (911 in the [place]  A suicide crisis helpline, such as the [place] at 409-428-9498. This is open 24 hours a day. Summary  Insomnia is a sleep disorder that makes it difficult to fall asleep or stay asleep.  Insomnia can be long-term (chronic) or short-term (acute).  Treatment for insomnia depends on the cause. Treatment may focus on treating an underlying condition that is causing insomnia.  Keep a sleep diary to help you and your health care provider figure out what could be causing your insomnia. This information is not intended to replace advice given to you by your health care provider. Make sure you discuss any questions you have with your health care provider. Document Revised: 06/11/2020 Document Reviewed: 06/11/2020 Elsevier Patient Education  2021 [employer].

## 2020-12-10 NOTE — Assessment & Plan Note (Signed)
Last LDL was 89 in January-converted to Praluent.  Plan: Recheck lipids in the month of May, and then again prior to follow-up in October.  Anticipate we may need to consider Nexletol or Nexlizet if not at goal.

## 2020-12-11 ENCOUNTER — Other Ambulatory Visit: Payer: Self-pay | Admitting: Cardiology

## 2020-12-11 ENCOUNTER — Other Ambulatory Visit: Payer: Self-pay | Admitting: Orthopaedic Surgery

## 2020-12-11 DIAGNOSIS — Z471 Aftercare following joint replacement surgery: Secondary | ICD-10-CM | POA: Diagnosis not present

## 2020-12-11 DIAGNOSIS — Z9861 Coronary angioplasty status: Secondary | ICD-10-CM | POA: Diagnosis not present

## 2020-12-11 DIAGNOSIS — M25552 Pain in left hip: Secondary | ICD-10-CM

## 2020-12-11 DIAGNOSIS — M7061 Trochanteric bursitis, right hip: Secondary | ICD-10-CM | POA: Diagnosis not present

## 2020-12-11 DIAGNOSIS — E785 Hyperlipidemia, unspecified: Secondary | ICD-10-CM | POA: Diagnosis not present

## 2020-12-11 DIAGNOSIS — I2510 Atherosclerotic heart disease of native coronary artery without angina pectoris: Secondary | ICD-10-CM | POA: Diagnosis not present

## 2020-12-11 DIAGNOSIS — R972 Elevated prostate specific antigen [PSA]: Secondary | ICD-10-CM | POA: Diagnosis not present

## 2020-12-11 DIAGNOSIS — M25551 Pain in right hip: Secondary | ICD-10-CM

## 2020-12-11 DIAGNOSIS — I25119 Atherosclerotic heart disease of native coronary artery with unspecified angina pectoris: Secondary | ICD-10-CM | POA: Diagnosis not present

## 2020-12-11 LAB — COMPREHENSIVE METABOLIC PANEL
ALT: 12 IU/L (ref 0–44)
AST: 13 IU/L (ref 0–40)
Albumin/Globulin Ratio: 1.8 (ref 1.2–2.2)
Albumin: 3.9 g/dL (ref 3.7–4.7)
Alkaline Phosphatase: 129 IU/L — ABNORMAL HIGH (ref 44–121)
BUN/Creatinine Ratio: 12 (ref 10–24)
BUN: 10 mg/dL (ref 8–27)
Bilirubin Total: 0.3 mg/dL (ref 0.0–1.2)
CO2: 22 mmol/L (ref 20–29)
Calcium: 8.9 mg/dL (ref 8.6–10.2)
Chloride: 109 mmol/L — ABNORMAL HIGH (ref 96–106)
Creatinine, Ser: 0.85 mg/dL (ref 0.76–1.27)
Globulin, Total: 2.2 g/dL (ref 1.5–4.5)
Glucose: 101 mg/dL — ABNORMAL HIGH (ref 65–99)
Potassium: 3.5 mmol/L (ref 3.5–5.2)
Sodium: 144 mmol/L (ref 134–144)
Total Protein: 6.1 g/dL (ref 6.0–8.5)
eGFR: 91 mL/min/{1.73_m2} (ref >59)

## 2020-12-11 LAB — LIPID PANEL
Chol/HDL Ratio: 2.4 ratio (ref 0.0–5.0)
Cholesterol, Total: 105 mg/dL (ref 100–199)
HDL: 44 mg/dL (ref >39)
LDL Chol Calc (NIH): 45 mg/dL (ref 0–99)
Triglycerides: 76 mg/dL (ref 0–149)
VLDL Cholesterol Cal: 16 mg/dL (ref 5–40)

## 2020-12-11 NOTE — Telephone Encounter (Signed)
Left message to call back to inquire about what pharmacy amlodipine Rx needs to be sent to Patient forgot to ask for refill at visit yesterday, per [facility] (check in/out)

## 2020-12-14 MED ORDER — AMLODIPINE BESYLATE 2.5 MG PO TABS
2.5000 mg | ORAL_TABLET | Freq: Every day | ORAL | 3 refills | Status: DC
Start: 2020-12-14 — End: 2021-04-01

## 2021-02-01 ENCOUNTER — Telehealth: Payer: Self-pay | Admitting: Pharmacist

## 2021-02-01 NOTE — Chronic Care Management (AMB) (Signed)
Chronic Care Management Pharmacy Assistant   Name: Luis Guzman  MRN: [id number] DOB: [birth year]  Reason for Encounter: Disease State/ General Assessment Call.    Conditions to be addressed/monitored: HTN and HLD   Recent office visits:  None.   Recent consult visits:  12/10/20 Glenetta Hew MD (Cardiology) - presented to clinic for follow up for CAD and other chronic conditions. Patient started on Aspirin 81mg  daily. Changed Trazadone from 25mg  at bedtime to 50mg  at bedtime. Discontinued clopidogrel and doxycycline. Follow up in 7 months.  Hospital visits:  None in previous 6 months  Medications: Outpatient Encounter Medications as of 02/01/2021  Medication Sig   acetaminophen (TYLENOL) 500 MG tablet TAKE TWO TABLETS BY MOUTH THREE TIMES A DAY AS NEEDED   Alirocumab (PRALUENT) 150 MG/ML SOAJ Inject 150 mg/mL into the skin every 14 (fourteen) days.   amLODipine (NORVASC) 2.5 MG tablet Take 1 tablet (2.5 mg total) by mouth daily.   aspirin EC 81 MG tablet Take 1 tablet (81 mg total) by mouth daily. Swallow whole. ( start after stopping clopidogrel 75 mg)   bimatoprost (LUMIGAN) 0.03 % ophthalmic solution Place 1 drop into both eyes at bedtime.   Brinzolamide-Brimonidine (SIMBRINZA) 1-0.2 % SUSP Place 1 drop into both eyes in the morning and at bedtime.   COVID-19 mRNA Vac-TriS, Pfizer, (PFIZER-BIONT COVID-19 VAC-TRIS) SUSP injection Inject into the muscle.   isosorbide mononitrate (IMDUR) 30 MG 24 hr tablet Take 1 tablet (30 mg total) by mouth daily.   losartan (COZAAR) 100 MG tablet Take 100 mg by mouth daily.   nitroGLYCERIN (NITROSTAT) 0.4 MG SL tablet Place 1 tablet (0.4 mg total) under the tongue every 5 (five) minutes as needed for chest pain.   traZODone (DESYREL) 50 MG tablet Take 50 mg by mouth at bedtime.   No facility-administered encounter medications on file as of 02/01/2021.    Reviewed chart prior to disease state call. Spoke with patient regarding BP  Recent  Office Vitals: BP Readings from Last 3 Encounters:  12/10/20 (!) 144/82  12/02/20 (!) 150/80  10/14/20 122/66   Pulse Readings from Last 3 Encounters:  12/10/20 70  12/02/20 72  10/14/20 87    Wt Readings from Last 3 Encounters:  12/10/20 192 lb (87.1 kg)  12/02/20 191 lb 11.2 oz (87 kg)  10/14/20 190 lb 12.8 oz (86.5 kg)     Kidney Function Lab Results  Component Value Date/Time   CREATININE 0.85 12/11/2020 08:08 AM   CREATININE 0.92 10/01/2020 05:01 AM   CREATININE 0.94 05/06/2020 08:42 AM   CREATININE 0.80 07/12/2013 03:53 PM   GFR 88.33 05/06/2019 09:07 AM   GFRNONAA >60 10/01/2020 05:01 AM   GFRAA 77 09/02/2020 04:05 PM    BMP Latest Ref Rng & Units 12/11/2020 10/01/2020 09/30/2020  Glucose 65 - 99 mg/dL 101(H) 175(H) 232(H)  BUN 8 - 27 mg/dL 10 15 13   Creatinine 0.76 - 1.27 mg/dL 0.85 0.92 0.98  BUN/Creat Ratio 10 - 24 12 - -  Sodium 134 - 144 mmol/L 144 138 140  Potassium 3.5 - 5.2 mmol/L 3.5 3.8 3.9  Chloride 96 - 106 mmol/L 109(H) 107 108  CO2 20 - 29 mmol/L 22 22 20(L)  Calcium 8.6 - 10.2 mg/dL 8.9 8.5(L) 9.0    Current antihypertensive regimen:  Amlodipine 2.5mg  - take once daily.  Losartan 100mg  - take once daily.  How often are you checking your Blood Pressure? daily Current home BP readings: 02/01/21 184/81, 01/31/21 177/81, 01/18/21  156/79, 01/19/21 140/79 and 6/8 152/82.  What recent interventions/DTPs have been made by any provider to improve Blood Pressure control since last CPP Visit: None.  Any recent hospitalizations or ED visits since last visit with CPP? No What diet changes have been made to improve Blood Pressure Control?  Patient stated that he dies not really eat breakfast much. Patient stated that fir lunch he eats out quite a bit at places like [employer], [employer] and [employer]. He tends to have a Souvlaki wrap and fries from [employer], a burrito bowl from [employer] with chicken and [place] noddle salad from [employer]. Patient states he probably drinks very  little water and drinks a lot of sweet tea. He mostly orders take out for dinner with his wife but if they cook at home its usually steak or chicken grilled with vegetables. Patient states he was just on vacation at the beach and ate out the whole time. Patient does not eat a lot of sweets or food high in cholesterol if he's being conscious.   What exercise is being done to improve your Blood Pressure Control?  Patient stated he is not very active or have a set schedule to exercise or go walking but he does try to hit 10000 steps a day on his watch. Sometimes its more and sometimes its less.   Adherence Review: Is the patient currently on ACE/ARB medication? Yes Does the patient have >5 day gap between last estimated fill dates? No  Comprehensive medication review performed; Spoke to patient regarding cholesterol  Lipid Panel    Component Value Date/Time   CHOL 105 12/11/2020 0808   TRIG 76 12/11/2020 0808   HDL 44 12/11/2020 0808   LDLCALC 45 12/11/2020 0808   LDLCALC 98 05/06/2020 0842   LDLDIRECT 129.0 08/19/2016 0838    10-year ASCVD risk score: The ASCVD Risk score Mikey Bussing DC Jr., et al., 2013) failed to calculate for the following reasons:   The valid total cholesterol range is 130 to 320 mg/dL  Current antihyperlipidemic regimen:  Praluent injection 150mg  - injection every 14 days.  Previous antihyperlipidemic medications tried: Repatha (formulary change), statins (muscle pain), Zetia ASCVD risk enhancing conditions: age >52 and HTN What recent interventions/DTPs have been made by any provider to improve Cholesterol control since last CPP Visit: None  Any recent hospitalizations or ED visits since last visit with CPP? No What diet changes have been made to improve Cholesterol?  Patient stated that he dies not really eat breakfast much. Patient stated that fir lunch he eats out quite a bit at places like [employer], [employer] and [employer]. He tends to have a Souvlaki wrap and fries  from [employer], a burrito bowl from [employer] with chicken and [place] noddle salad from [employer]. Patient states he probably drinks very little water and drinks a lot of sweet tea. He mostly orders take out for dinner with his wife but if they cook at home its usually steak or chicken grilled with vegetables. Patient states he was just on vacation at the beach and ate out the whole time. Patient does not eat a lot of sweets or food high in cholesterol if he's being conscious.   What exercise is being done to improve Cholesterol?  Patient stated he is not very active or have a set schedule to exercise or go walking but he does try to hit 10000 steps a day on his watch. Sometimes its more and sometimes its less.  Adherence Review: Does the patient  have >5 day gap between last estimated fill dates? No  Notes: Called [facility] listed in patients chart and spoek with Jamar who stated that he does not see any fill history for patients losartan.  Tried to contact [facility] but was closed.  Spoke with patient and verified that he does get his losartan from the [facility]. I reviewed patients medications as listed and patient reports no issues and taking all medications as prescribed. Patient checks blood pressure but he did not check while on vacations this past week. Patient states he ate out a lot the whole vacations and his blood pressure has been running high this week. Patient reports no symptoms of high blood pressure like headache, blurry vision etc. Patient thanked me for my call.   Star Rating Drugs:  Losartan 100mg  - no fill history listed in chart. Gets from the [facility] in [place].   [facility]  Clinical Pharmacist Assistant [phone]

## 2021-02-19 DIAGNOSIS — H40053 Ocular hypertension, bilateral: Secondary | ICD-10-CM | POA: Diagnosis not present

## 2021-02-22 NOTE — Telephone Encounter (Signed)
Sent message to patient   Hello Mr Hayashida,  Per Dr Ellyn Hack ,last note he would to increase  on of medication .  Are taking Losartan 100 mg  and amlodipine 2.5 mg but splitting the the time ? Meaning taking one of them in the morning and one in the evening.  I will send message to Dr Ellyn Hack. He is out of the office this week, I will contact you when I receive instructions.  Continue monitoring your blood pressure.  Ivin Booty RN

## 2021-02-26 NOTE — Telephone Encounter (Signed)
  Good Afternoon,   I just wanted to let you know that my BP reading has averaged 166/80 since June 1 and was wondering if any action needed to be taken before my next visit on November 28.   Regards, Luis Guzman   Luis Guzman, his blood pressures are little higher than we would like them to be.  Since you are splitting up your medications lets have you double up the amlodipine dose at night to 5 mg and continue to follow your blood pressures.    Glenetta Hew, MD

## 2021-03-03 DIAGNOSIS — I10 Essential (primary) hypertension: Secondary | ICD-10-CM | POA: Diagnosis not present

## 2021-03-03 DIAGNOSIS — M4302 Spondylolysis, cervical region: Secondary | ICD-10-CM | POA: Diagnosis not present

## 2021-03-06 ENCOUNTER — Other Ambulatory Visit: Payer: Self-pay | Admitting: Neurological Surgery

## 2021-03-06 DIAGNOSIS — M4302 Spondylolysis, cervical region: Secondary | ICD-10-CM

## 2021-03-12 ENCOUNTER — Ambulatory Visit
Admission: RE | Admit: 2021-03-12 | Discharge: 2021-03-12 | Disposition: A | Discharge status: Home / Self Care | Payer: Medicare Other | Source: Ambulatory Visit | Attending: Neurological Surgery | Admitting: Neurological Surgery

## 2021-03-12 ENCOUNTER — Other Ambulatory Visit: Payer: Self-pay

## 2021-03-12 DIAGNOSIS — M4802 Spinal stenosis, cervical region: Secondary | ICD-10-CM | POA: Diagnosis not present

## 2021-03-12 DIAGNOSIS — M4302 Spondylolysis, cervical region: Secondary | ICD-10-CM

## 2021-03-16 DIAGNOSIS — M4302 Spondylolysis, cervical region: Secondary | ICD-10-CM | POA: Diagnosis not present

## 2021-03-19 DIAGNOSIS — M5412 Radiculopathy, cervical region: Secondary | ICD-10-CM | POA: Diagnosis not present

## 2021-03-29 DIAGNOSIS — M4302 Spondylolysis, cervical region: Secondary | ICD-10-CM | POA: Diagnosis not present

## 2021-03-29 DIAGNOSIS — M5412 Radiculopathy, cervical region: Secondary | ICD-10-CM | POA: Diagnosis not present

## 2021-03-29 DIAGNOSIS — I10 Essential (primary) hypertension: Secondary | ICD-10-CM | POA: Diagnosis not present

## 2021-03-29 DIAGNOSIS — R29898 Other symptoms and signs involving the musculoskeletal system: Secondary | ICD-10-CM | POA: Diagnosis not present

## 2021-03-31 ENCOUNTER — Telehealth: Payer: Self-pay | Admitting: Family Medicine

## 2021-03-31 NOTE — Telephone Encounter (Signed)
Left message for patient to call back and schedule Medicare Annual Wellness Visit (AWV) either virtually or in office.  Left both  my jabber number [phone] and office number    Last AWV 05/06/20 please schedule at anytime with [facility] Nurse Health Advisor 1 or 2   This should be a 45 minute visit.

## 2021-04-01 MED ORDER — AMLODIPINE BESYLATE 10 MG PO TABS: 10.0000 mg | ORAL_TABLET | Freq: Every day | ORAL | 3 refills | Status: DC

## 2021-04-01 NOTE — Telephone Encounter (Signed)
Hello Mr Bamberger,  Dr Ellyn Hack  is out of the office for  week or so. Dr Sallyanne Kuster is  looking after his patient while he is out.   Dr Sallyanne Kuster would like for to increase Amlodipine to 10 mg  and monitor blood pressure   Just take 2 tablets of 5 mg tablet  or 4 tablets of 2.5 mg tablet before bottle is empty call office  will change prescription at that time  Croitoru, Dani Gobble, MD  Devra Dopp E, LPN 51 minutes ago ([id number] PM)    OK. Then amlodipine 10 mg daily please.

## 2021-04-01 NOTE — Telephone Encounter (Signed)
Pt has been taking 5 mg for about a month Will need new recommendations .

## 2021-04-02 MED ORDER — AMLODIPINE BESYLATE 10 MG PO TABS: 10.0000 mg | ORAL_TABLET | Freq: Every day | ORAL | 1 refills | Status: DC

## 2021-04-06 ENCOUNTER — Other Ambulatory Visit: Payer: Self-pay | Admitting: *Deleted

## 2021-04-28 DIAGNOSIS — M4302 Spondylolysis, cervical region: Secondary | ICD-10-CM | POA: Diagnosis not present

## 2021-04-28 DIAGNOSIS — Z4789 Encounter for other orthopedic aftercare: Secondary | ICD-10-CM | POA: Diagnosis not present

## 2021-04-28 DIAGNOSIS — Z96641 Presence of right artificial hip joint: Secondary | ICD-10-CM | POA: Diagnosis not present

## 2021-05-04 DIAGNOSIS — M25551 Pain in right hip: Secondary | ICD-10-CM | POA: Diagnosis not present

## 2021-05-07 ENCOUNTER — Encounter: Payer: Medicare Other | Admitting: Family Medicine

## 2021-05-07 ENCOUNTER — Ambulatory Visit (INDEPENDENT_AMBULATORY_CARE_PROVIDER_SITE_OTHER): Payer: Medicare Other

## 2021-05-07 DIAGNOSIS — M25551 Pain in right hip: Secondary | ICD-10-CM | POA: Diagnosis not present

## 2021-05-07 DIAGNOSIS — Z0000 Encounter for general adult medical examination without abnormal findings: Secondary | ICD-10-CM | POA: Diagnosis not present

## 2021-05-07 NOTE — Patient Instructions (Signed)
Luis Guzman , Thank you for taking time to come for your Medicare Wellness Visit. I appreciate your ongoing commitment to your health goals. Please review the following plan we discussed and let me know if I can assist you in the future.   Screening recommendations/referrals: Colonoscopy: 07/032017  due 2027 Recommended yearly ophthalmology/optometry visit for glaucoma screening and checkup Recommended yearly dental visit for hygiene and checkup  Vaccinations: Influenza vaccine: due in fall 2022  Pneumococcal vaccine: completed series  Tdap vaccine: 05/05/2014 Shingles vaccine: completed series     Advanced directives: will provided copies   Conditions/risks identified: none   Next appointment: 05/18/2021  [facility]  Dr.Burchette   Preventive Care 32 Years and Older, Male Preventive care refers to lifestyle choices and visits with your health care provider that can promote health and wellness. What does preventive care include? A yearly physical exam. This is also called an annual well check. Dental exams once or twice a year. Routine eye exams. Ask your health care provider how often you should have your eyes checked. Personal lifestyle choices, including: Daily care of your teeth and gums. Regular physical activity. Eating a healthy diet. Avoiding tobacco and drug use. Limiting alcohol use. Practicing safe sex. Taking low doses of aspirin every day. Taking vitamin and mineral supplements as recommended by your health care provider. What happens during an annual well check? The services and screenings done by your health care provider during your annual well check will depend on your age, overall health, lifestyle risk factors, and family history of disease. Counseling  Your health care provider may ask you questions about your: Alcohol use. Tobacco use. Drug use. Emotional well-being. Home and relationship well-being. Sexual activity. Eating habits. History of falls. Memory and  ability to understand (cognition). Work and work Statistician. Screening  You may have the following tests or measurements: Height, weight, and BMI. Blood pressure. Lipid and cholesterol levels. These may be checked every 5 years, or more frequently if you are over 39 years old. Skin check. Lung cancer screening. You may have this screening every year starting at age 67 if you have a 30-pack-year history of smoking and currently smoke or have quit within the past 15 years. Fecal occult blood test (FOBT) of the stool. You may have this test every year starting at age 52. Flexible sigmoidoscopy or colonoscopy. You may have a sigmoidoscopy every 5 years or a colonoscopy every 10 years starting at age 51. Prostate cancer screening. Recommendations will vary depending on your family history and other risks. Hepatitis C blood test. Hepatitis B blood test. Sexually transmitted disease (STD) testing. Diabetes screening. This is done by checking your blood sugar (glucose) after you have not eaten for a while (fasting). You may have this done every 1-3 years. Abdominal aortic aneurysm (AAA) screening. You may need this if you are a current or former smoker. Osteoporosis. You may be screened starting at age 41 if you are at high risk. Talk with your health care provider about your test results, treatment options, and if necessary, the need for more tests. Vaccines  Your health care provider may recommend certain vaccines, such as: Influenza vaccine. This is recommended every year. Tetanus, diphtheria, and acellular pertussis (Tdap, Td) vaccine. You may need a Td booster every 10 years. Zoster vaccine. You may need this after age 2. Pneumococcal 13-valent conjugate (PCV13) vaccine. One dose is recommended after age 56. Pneumococcal polysaccharide (PPSV23) vaccine. One dose is recommended after age 47. Talk to your health  care provider about which screenings and vaccines you need and how often you need  them. This information is not intended to replace advice given to you by your health care provider. Make sure you discuss any questions you have with your health care provider. Document Released: 08/28/2015 Document Revised: 04/20/2016 Document Reviewed: 06/02/2015 Elsevier Interactive Patient Education  2017 [employer] Prevention in the Home Falls can cause injuries. They can happen to people of all ages. There are many things you can do to make your home safe and to help prevent falls. What can I do on the outside of my home? Regularly fix the edges of walkways and driveways and fix any cracks. Remove anything that might make you trip as you walk through a door, such as a raised step or threshold. Trim any bushes or trees on the path to your home. Use bright outdoor lighting. Clear any walking paths of anything that might make someone trip, such as rocks or tools. Regularly check to see if handrails are loose or broken. Make sure that both sides of any steps have handrails. Any raised decks and porches should have guardrails on the edges. Have any leaves, snow, or ice cleared regularly. Use sand or salt on walking paths during winter. Clean up any spills in your garage right away. This includes oil or grease spills. What can I do in the bathroom? Use night lights. Install grab bars by the toilet and in the tub and shower. Do not use towel bars as grab bars. Use non-skid mats or decals in the tub or shower. If you need to sit down in the shower, use a plastic, non-slip stool. Keep the floor dry. Clean up any water that spills on the floor as soon as it happens. Remove soap buildup in the tub or shower regularly. Attach bath mats securely with double-sided non-slip rug tape. Do not have throw rugs and other things on the floor that can make you trip. What can I do in the bedroom? Use night lights. Make sure that you have a light by your bed that is easy to reach. Do not use  any sheets or blankets that are too big for your bed. They should not hang down onto the floor. Have a firm chair that has side arms. You can use this for support while you get dressed. Do not have throw rugs and other things on the floor that can make you trip. What can I do in the kitchen? Clean up any spills right away. Avoid walking on wet floors. Keep items that you use a lot in easy-to-reach places. If you need to reach something above you, use a strong step stool that has a grab bar. Keep electrical cords out of the way. Do not use floor polish or wax that makes floors slippery. If you must use wax, use non-skid floor wax. Do not have throw rugs and other things on the floor that can make you trip. What can I do with my stairs? Do not leave any items on the stairs. Make sure that there are handrails on both sides of the stairs and use them. Fix handrails that are broken or loose. Make sure that handrails are as long as the stairways. Check any carpeting to make sure that it is firmly attached to the stairs. Fix any carpet that is loose or worn. Avoid having throw rugs at the top or bottom of the stairs. If you do have throw rugs, attach them to  the floor with carpet tape. Make sure that you have a light switch at the top of the stairs and the bottom of the stairs. If you do not have them, ask someone to add them for you. What else can I do to help prevent falls? Wear shoes that: Do not have high heels. Have rubber bottoms. Are comfortable and fit you well. Are closed at the toe. Do not wear sandals. If you use a stepladder: Make sure that it is fully opened. Do not climb a closed stepladder. Make sure that both sides of the stepladder are locked into place. Ask someone to hold it for you, if possible. Clearly mark and make sure that you can see: Any grab bars or handrails. First and last steps. Where the edge of each step is. Use tools that help you move around (mobility aids)  if they are needed. These include: Canes. Walkers. Scooters. Crutches. Turn on the lights when you go into a dark area. Replace any light bulbs as soon as they burn out. Set up your furniture so you have a clear path. Avoid moving your furniture around. If any of your floors are uneven, fix them. If there are any pets around you, be aware of where they are. Review your medicines with your doctor. Some medicines can make you feel dizzy. This can increase your chance of falling. Ask your doctor what other things that you can do to help prevent falls. This information is not intended to replace advice given to you by your health care provider. Make sure you discuss any questions you have with your health care provider. Document Released: 05/28/2009 Document Revised: 01/07/2016 Document Reviewed: 09/05/2014 Elsevier Interactive Patient Education  2017 [employer].

## 2021-05-07 NOTE — Progress Notes (Signed)
Subjective:   Luis Guzman is a 75 y.o. male who presents for an Subsequent Medicare Annual Wellness Visit.   I connected with Luis Guzman today by telephone and verified that I am speaking with the correct person using two identifiers. Location patient: home Location provider: work Persons participating in the virtual visit: patient, provider.   I discussed the limitations, risks, security and privacy concerns of performing an evaluation and management service by telephone and the availability of in person appointments. I also discussed with the patient that there may be a patient responsible charge related to this service. The patient expressed understanding and verbally consented to this telephonic visit.    Interactive audio and video telecommunications were attempted between this provider and patient, however failed, due to patient having technical difficulties OR patient did not have access to video capability.  We continued and completed visit with audio only.    Review of Systems    N/A       Objective:    Today's Vitals   05/07/21 0820  PainSc: 4    There is no height or weight on file to calculate BMI.  Advanced Directives 09/30/2020 09/30/2020 09/07/2020 05/15/2020 03/04/2020 03/04/2020 02/24/2020  Does Patient Have a Medical Advance Directive? Yes;No Yes Yes Yes Yes Yes Yes  Type of Academic librarian;Living will [place];Living will [place];Living will [employer];Living will [place];Living will [place];Living will [place];Living will  Does patient want to make changes to medical advance directive? No - Guardian declined - - No - Patient declined No - Patient declined No - Patient declined No - Patient declined  Copy of Healthcare Power of Attorney in Chart? - No - copy requested - No - copy requested No - copy requested No - copy  requested No - copy requested  Pre-existing out of facility DNR order (yellow form or pink MOST form) - - - - - - -    Current Medications (verified) Outpatient Encounter Medications as of 05/07/2021  Medication Sig   acetaminophen (TYLENOL) 500 MG tablet TAKE TWO TABLETS BY MOUTH THREE TIMES A DAY AS NEEDED   Alirocumab (PRALUENT) 150 MG/ML SOAJ Inject 150 mg/mL into the skin every 14 (fourteen) days.   amLODipine (NORVASC) 10 MG tablet Take 1 tablet (10 mg total) by mouth daily.   aspirin EC 81 MG tablet Take 1 tablet (81 mg total) by mouth daily. Swallow whole. ( start after stopping clopidogrel 75 mg)   bimatoprost (LUMIGAN) 0.03 % ophthalmic solution Place 1 drop into both eyes at bedtime.   Brinzolamide-Brimonidine (SIMBRINZA) 1-0.2 % SUSP Place 1 drop into both eyes in the morning and at bedtime.   COVID-19 mRNA Vac-TriS, Pfizer, (PFIZER-BIONT COVID-19 VAC-TRIS) SUSP injection Inject into the muscle.   isosorbide mononitrate (IMDUR) 30 MG 24 hr tablet Take 1 tablet (30 mg total) by mouth daily.   losartan (COZAAR) 100 MG tablet Take 100 mg by mouth daily.   nitroGLYCERIN (NITROSTAT) 0.4 MG SL tablet Place 1 tablet (0.4 mg total) under the tongue every 5 (five) minutes as needed for chest pain.   traZODone (DESYREL) 50 MG tablet Take 50 mg by mouth at bedtime.   No facility-administered encounter medications on file as of 05/07/2021.    Allergies (verified) Penicillins   History: Past Medical History:  Diagnosis Date   AAA (abdominal aortic aneurysm) (HCC)    Anemia  as infant history of   CAD S/P PTCA & DES PCI - for Progressive Angina 02/26/2019   Cath-PCI 02/26/2019: EF 55-65%. mCx 65% (FFR 0.82 - Med Rx). D2 85% - Wolverine Scoring Balloon PTCA (2.0 mm). Ost rPDA - 90% -Resolute Onyx 2.5 x 15 (2.6 mm). 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 lower back pain, s/p cervical neck surgery"stiffness" remains   Transfusion history    infant "anemia"   Past Surgical History:  Procedure Laterality Date   APPENDECTOMY  ~ [place] N/A 07/30/2018   Procedure: Cervical five-six Cervical six-seven Artificial disc replacement;  Surgeon: Kristeen Miss, MD;  Location: [facility];  Service: Neurosurgery;  Laterality: N/A;   CERVICAL LAMINECTOMY  1997   C 4 and C 5   CHOLECYSTECTOMY N/A 04/15/2014   Procedure: LAPAROSCOPIC CHOLECYSTECTOMY WITH INTRAOPERATIVE CHOLANGIOGRAM;  Surgeon: Armandina Gemma, MD;  Location: [facility];  Service: General;  Laterality: N/A;   COLONOSCOPY     CORONARY BALLOON ANGIOPLASTY N/A 02/26/2019   Procedure: CORONARY BALLOON ANGIOPLASTY;  Surgeon: Leonie Man, MD;  Location: [facility];  Service: Cardiovascular -> scoring balloon PTCA (Wolverine 2.0 mm) of ostial D2 85% reducing to 20-30%.   CORONARY STENT INTERVENTION N/A 02/26/2019   Procedure: CORONARY STENT INTERVENTION;  Surgeon: Leonie Man, MD;  Location: [facility];; PCI ostial RPDA (initial attempt of PTCA only led to small local tear/dissection covered with stent) Resolute Onyx 2.5 mm x 15 mm (2.6 mm   CORONARY STENT INTERVENTION N/A 07/05/2019   Procedure: CORONARY STENT INTERVENTION;  Surgeon: Leonie Man, MD;  Location: [facility];  Service: Cardiovascular;; ostial D1 95% (restenosis of PTCA site) -> DES PCI RESOLUTE ONYX 2.25 mm x 50 mm (2.6 mm) positioned to avoid the true ostium.    ESOPHAGOGASTRODUODENOSCOPY N/A 03/13/2014   Procedure: ESOPHAGOGASTRODUODENOSCOPY (EGD);  Surgeon: Wonda Horner, MD;   Location[facility];  Service: Endoscopy;  Laterality: N/A;   INTRAVASCULAR PRESSURE WIRE/FFR STUDY N/A 02/26/2019   Procedure: INTRAVASCULAR PRESSURE WIRE/FFR STUDY;  Surgeon: Leonie Man, MD;  Location: [facility];  Service: Cardiovascular;; mCx ~65% - DFR 0.92, FR 0.82 - BORDERLINE--> MED Rx   LEFT HEART CATH AND CORONARY ANGIOGRAPHY N/A 02/26/2019   Procedure: LEFT HEART CATH AND CORONARY ANGIOGRAPHY;  Surgeon: Leonie Man, MD;  Location: [place] [facility];  EF 55-65%. mCx 65% (FFR 0.82 - Med Rx). D2 85% - Wolverine Scoring Balloon PTCA (2.0 mm). Ost rPDA - 90% -Resolute Onyx 2.5 x 15 (2.6 mm).    LEFT HEART CATH AND CORONARY ANGIOGRAPHY N/A 07/05/2019   Procedure: LEFT HEART CATH AND CORONARY ANGIOGRAPHY;  Surgeon: Leonie Man, MD;  Location: [facility];; ostial D1 95% (restenosis of PTCA site) -> DES PCI.  PDA stent widely patent.  Mid CX stable 65% lesion.  Proximal RCA 40%.  Mid RCA 45%.  Normal EDP.  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   SHOULDER ARTHROSCOPY Bilateral    SHOULDER ARTHROSCOPY W/ ROTATOR CUFF REPAIR Bilateral 2004-2009   left 2004, right 2009   TOTAL HIP ARTHROPLASTY Left 03/04/2020   Procedure: TOTAL HIP ARTHROPLASTY ANTERIOR APPROACH;  Surgeon: Gaynelle Arabian, MD;  Location[facility];  Service: Orthopedics;  Laterality: Left;  163min   TOTAL HIP ARTHROPLASTY Right 09/30/2020   Procedure: TOTAL HIP ARTHROPLASTY ANTERIOR APPROACH;  Surgeon: Gaynelle Arabian, MD;  Location[facility];  Service: Orthopedics;  Laterality: Right;  174min   TRANSTHORACIC ECHOCARDIOGRAM  02/2020; 10/01/2020   a) EF 55 to 60%.  GR 1 DD.  No R WMA.  Trivial MR.  Otherwise normal.; b) EF 55 to 60%.  Normal LV function and no wall motion abnormality.  Normal RV.  Normal valves.[name]   VASECTOMY     WISDOM TOOTH EXTRACTION     Family History  Problem Relation Age of Onset   Hypertension Mother    Hypertension Father    Arthritis Sister        rheumatoid    Stroke Sister 40   Social History   Socioeconomic History   Marital status: Married    Spouse name: Not on file   Number of children: Not on file   Years of education: Not on file   Highest education level: Not on file  Occupational History   Not on file  Tobacco Use   Smoking status: Former    Packs/day: 1.00    Years: 5.00    Pack years: 5.00    Types: Cigarettes    Quit date: 10/17/1968    Years since quitting: 52.5   Smokeless tobacco: Never  Vaping Use   Vaping Use: Never used  Substance and Sexual Activity   Alcohol use: Not Currently    Comment: "quit drinking ~ 10/1968   Drug use: No   Sexual activity: Not on file  Other Topics Concern   Not on file  Social History Narrative   Not on file   Social Determinants of Health   Financial Resource Strain: Low Risk    Difficulty of Paying Living Expenses: Not hard at all  Food Insecurity: No Food Insecurity   Worried About Charity fundraiser in the Last Year: Never true   [place] in the Last Year: Never true  Transportation Needs: No Transportation Needs   Lack of Transportation (Medical): No   Lack of Transportation (Non-Medical): No  Physical Activity: Sufficiently Active   Days of Exercise per Week: 7 days   Minutes of Exercise per Session: 30 min  Stress: No Stress Concern Present   Feeling of Stress : Not at all  Social Connections: Moderately Integrated   Frequency of Communication with Friends and Family: More than three times a week   Frequency of Social Gatherings with Friends and Family: More than three times a week   Attends Religious Services: More than 4 times per year   Active Member of [employer] or Organizations: No   Attends Music therapist: Never   Marital Status: Married    Tobacco Counseling Counseling given: Not Answered   Clinical Intake:  Pre-visit preparation completed: Yes  Pain : 0-10 Pain Score: 4  Pain Type: Acute pain Pain Location: Hip Pain  Orientation: Right Pain Descriptors / Indicators: Aching, Burning Pain Onset: More than a month ago Effect of Pain on Daily Activities: yes     Nutritional Risks: None Diabetes:  No  How often do you need to have someone help you when you read instructions, pamphlets, or other written materials from your doctor or pharmacy?: 1 - Never What is the last grade level you completed in school?: college  Diabetic?no   Interpreter Needed?: No  Information entered by :: [email or web address]   Activities of Daily Living In your present state of health, do you have any difficulty performing the following activities: 09/30/2020 09/07/2020  Hearing? Y Y  Comment - does not wear hearing aids all the time  Vision? Y Y  Difficulty concentrating or making decisions? N N  Walking or climbing stairs? Y Y  Dressing or bathing? N N  Doing errands, shopping? Y N  Preparing Food and eating ? - -  Using the Toilet? - -  In the past six months, have you accidently leaked urine? - -  Do you have problems with loss of bowel control? - -  Managing your Medications? - -  Managing your Finances? - -  Housekeeping or managing your Housekeeping? - -  Some recent data might be hidden    Patient Care Team: Eulas Post, MD as PCP - General Leonie Man, MD as PCP - Cardiology (Cardiology) Kristeen Miss, MD as Consulting Physician (Neurosurgery) Garald Balding, MD as Consulting Physician (Orthopedic Surgery) Viona Gilmore, [facility] as Pharmacist (Pharmacist)  Indicate any recent Medical Services you may have received from other than [facility] providers in the past year (date may be approximate).     Assessment:   This is a routine wellness examination for [place].  Hearing/Vision screen No results found.  Dietary issues and exercise activities discussed:     Goals Addressed   None    Depression Screen PHQ 2/9 Scores 05/15/2020 05/06/2020 05/06/2019 04/04/2019 05/02/2018 05/02/2018 04/18/2017  PHQ - 2  Score 0 0 0 0 0 0 0  PHQ- 9 Score 0 4 0 - - - -    Fall Risk Fall Risk  05/15/2020 05/06/2020 05/06/2019 04/04/2019 05/02/2018  Falls in the past year? 0 0 0 0 No  Number falls in past yr: 0 0 - 0 -  Injury with Fall? 0 0 - 0 -  Follow up Falls evaluation completed;Falls prevention discussed - - Falls evaluation completed -    FALL RISK PREVENTION PERTAINING TO THE HOME:  Any stairs in or around the home? No  If so, are there any without handrails? No  Home free of loose throw rugs in walkways, pet beds, electrical cords, etc? Yes  Adequate lighting in your home to reduce risk of falls? Yes   ASSISTIVE DEVICES UTILIZED TO PREVENT FALLS:  Life alert? No  Use of a cane, walker or w/c? No  Grab bars in the bathroom? Yes  Shower chair or bench in shower? No  Elevated toilet seat or a handicapped toilet? No     Cognitive Function: Normal cognitive status assessed by direct observation by this Nurse Health Advisor. No abnormalities found.   MMSE - Mini Mental State Exam 05/02/2018  Not completed: (No Data)        Immunizations Immunization History  Administered Date(s) Administered   Fluad Quad(high Dose 65+) 05/06/2019, 05/06/2020   Influenza Split 05/15/2012   Influenza Whole 06/15/2010   Influenza, High Dose Seasonal PF 04/15/2016, 04/18/2017, 05/02/2018   Influenza,inj,Quad PF,6+ Mos 08/02/2013, 05/05/2014   Influenza,inj,quad, With Preservative 05/16/2019   Influenza-Unspecified 07/30/2015   PFIZER Comirnaty(Gray Top)Covid-19 Tri-Sucrose Vaccine 12/10/2020   PFIZER(Purple Top)SARS-COV-2 Vaccination 08/30/2019, 09/20/2019,  06/13/2020   Pneumococcal Conjugate-13 09/27/2013   Pneumococcal Polysaccharide-23 09/15/2008, 04/16/2015   Td 08/16/2003   Tdap 05/05/2014   Zoster Recombinat (Shingrix) 10/14/2018, 10/14/2018, 03/20/2019, 03/20/2019   Zoster, Live 06/17/2011    TDAP status: Up to date  Flu Vaccine status: Up to date  Pneumococcal vaccine status: Up to  date  Covid-19 vaccine status: Completed vaccines  Qualifies for Shingles Vaccine? Yes   Zostavax completed Yes   Shingrix Completed?: Yes  Screening Tests Health Maintenance  Topic Date Due   OPHTHALMOLOGY EXAM  03/30/2019   FOOT EXAM  05/03/2019   HEMOGLOBIN A1C  11/03/2020   INFLUENZA VACCINE  03/15/2021   TETANUS/TDAP  09/17/2025   COLONOSCOPY (Pts 45-83yrs Insurance coverage will need to be confirmed)  02/14/2026   COVID-19 Vaccine  Completed   Hepatitis C Screening  Completed   Zoster Vaccines- Shingrix  Completed   HPV VACCINES  Aged Out    Health Maintenance  Health Maintenance Due  Topic Date Due   OPHTHALMOLOGY EXAM  03/30/2019   FOOT EXAM  05/03/2019   HEMOGLOBIN A1C  11/03/2020   INFLUENZA VACCINE  03/15/2021    Colorectal cancer screening: Type of screening: Colonoscopy. Completed 02/15/2016. Repeat every 10 years  Lung Cancer Screening: (Low Dose CT Chest recommended if Age 74-80 years, 30 pack-year currently smoking OR have quit w/in 15years.) does not qualify.   Lung Cancer Screening Referral: n/a  Additional Screening:  Hepatitis C Screening: does not qualify; Completed 02/26/2014  Vision Screening: Recommended annual ophthalmology exams for early detection of glaucoma and other disorders of the eye. Is the patient up to date with their annual eye exam?  Yes  Who is the provider or what is the name of the office in which the patient attends annual eye exams? Dr.Scott  If pt is not established with a provider, would they like to be referred to a provider to establish care? No .   Dental Screening: Recommended annual dental exams for proper oral hygiene  Community Resource Referral / Chronic Care Management: CRR required this visit?  No   CCM required this visit?  No      Plan:     I have personally reviewed and noted the following in the patient's chart:   Medical and social history Use of alcohol, tobacco or illicit drugs  Current  medications and supplements including opioid prescriptions. Patient is not currently taking opioid prescriptions. Functional ability and status Nutritional status Physical activity Advanced directives List of other physicians Hospitalizations, surgeries, and ER visits in previous 12 months Vitals Screenings to include cognitive, depression, and falls Referrals and appointments  In addition, I have reviewed and discussed with patient certain preventive protocols, quality metrics, and best practice recommendations. A written personalized care plan for preventive services as well as general preventive health recommendations were provided to patient.     Randel Pigg, LPN   09/02/4172   Nurse Notes: none

## 2021-05-12 DIAGNOSIS — M25551 Pain in right hip: Secondary | ICD-10-CM | POA: Diagnosis not present

## 2021-05-14 DIAGNOSIS — M25551 Pain in right hip: Secondary | ICD-10-CM | POA: Diagnosis not present

## 2021-05-15 ENCOUNTER — Other Ambulatory Visit: Payer: Self-pay | Admitting: Cardiology

## 2021-05-17 ENCOUNTER — Other Ambulatory Visit: Payer: Self-pay

## 2021-05-18 ENCOUNTER — Encounter: Payer: Self-pay | Admitting: Family Medicine

## 2021-05-18 ENCOUNTER — Ambulatory Visit (INDEPENDENT_AMBULATORY_CARE_PROVIDER_SITE_OTHER): Payer: Medicare Other | Admitting: Family Medicine

## 2021-05-18 VITALS — BP 130/62 | HR 70 | Temp 98.3°F | Ht 72.0 in | Wt 188.0 lb

## 2021-05-18 DIAGNOSIS — E785 Hyperlipidemia, unspecified: Secondary | ICD-10-CM | POA: Diagnosis not present

## 2021-05-18 DIAGNOSIS — L570 Actinic keratosis: Secondary | ICD-10-CM

## 2021-05-18 DIAGNOSIS — Z23 Encounter for immunization: Secondary | ICD-10-CM | POA: Diagnosis not present

## 2021-05-18 DIAGNOSIS — I10 Essential (primary) hypertension: Secondary | ICD-10-CM

## 2021-05-18 DIAGNOSIS — R748 Abnormal levels of other serum enzymes: Secondary | ICD-10-CM

## 2021-05-18 DIAGNOSIS — I209 Angina pectoris, unspecified: Secondary | ICD-10-CM

## 2021-05-18 DIAGNOSIS — R739 Hyperglycemia, unspecified: Secondary | ICD-10-CM

## 2021-05-18 LAB — COMPREHENSIVE METABOLIC PANEL
ALT: 17 U/L (ref 0–53)
AST: 20 U/L (ref 0–37)
Albumin: 4.2 g/dL (ref 3.5–5.2)
Alkaline Phosphatase: 117 U/L (ref 39–117)
BUN: 15 mg/dL (ref 6–23)
CO2: 28 mEq/L (ref 19–32)
Calcium: 9.1 mg/dL (ref 8.4–10.5)
Chloride: 108 mEq/L (ref 96–112)
Creatinine, Ser: 0.95 mg/dL (ref 0.40–1.50)
GFR: 78.5 mL/min (ref >60.00)
Glucose, Bld: 110 mg/dL — ABNORMAL HIGH (ref 70–99)
Potassium: 3.5 mEq/L (ref 3.5–5.1)
Sodium: 143 mEq/L (ref 135–145)
Total Bilirubin: 0.4 mg/dL (ref 0.2–1.2)
Total Protein: 6.5 g/dL (ref 6.0–8.3)

## 2021-05-18 LAB — HEMOGLOBIN A1C: Hgb A1c MFr Bld: 6 % (ref 4.6–6.5)

## 2021-05-18 LAB — LIPID PANEL
Cholesterol: 101 mg/dL (ref 0–200)
HDL: 42.9 mg/dL (ref >39.00)
LDL Cholesterol: 46 mg/dL (ref 0–99)
NonHDL: 57.94
Total CHOL/HDL Ratio: 2
Triglycerides: 59 mg/dL (ref 0.0–149.0)
VLDL: 11.8 mg/dL (ref 0.0–40.0)

## 2021-05-18 NOTE — Progress Notes (Signed)
 Established Patient Office Visit  Subjective:  Patient ID: Luis Guzman, male    DOB: 03/10/1946  Age: 75 y.o. MRN: [id number]  CC:  Chief Complaint  Patient presents with   Annual Exam    HPI [name] presents for medical follow-up.  He was initially scheduled for a "complete physical.  However, he has Medicare primary and just had Medicare wellness visit.  We discussed several chronic problems as below.  He has chronic problems including history of CAD, hypertension, prediabetes range blood sugars, hyperlipidemia.  Generally doing well.  No recent active chest pain.  He walks some for exercise and generally tolerating well.  Does need flu vaccine.  Other immunizations up-to-date.  He had lipids and chemistries done per cardiology back in April.  Lipids were much improved.  He is currently on Praluent.  He did have mildly elevated alkaline phosphatase level of 129.  He has chronic insomnia which is treated with trazodone 50 mg daily and that seems to working well for him.  Blood pressures been well controlled.  No recent dizziness.  History of elevated PSA.  Previous biopsies per urology negative for cancer.  He gets his yearly PSA through urology.  He does have scaly slightly sore spot on his mid parietal scalp which is been present for months.  Does try to wear Her hat for protection from sun.  Past Medical History:  Diagnosis Date   AAA (abdominal aortic aneurysm)    Anemia    as infant history of   CAD S/P PTCA & DES PCI - for Progressive Angina 02/26/2019   Cath-PCI 02/26/2019: EF 55-65%. mCx 65% (FFR 0.82 - Med Rx). D2 85% - Wolverine Scoring Balloon PTCA (2.0 mm). Ost rPDA - 90% -Resolute Onyx 2.5 x 15 (2.6 mm). --> 06/2019: staged DES PCI ost D1 (RESOLUTE ONYX 2.25 mm x 50 mm (2.6 mm) positioned to avoid the true ostium)   Elevated PSA    GERD (gastroesophageal reflux disease)    History of diabetes mellitus, type II    loss weight under control currently   History of  hiatal hernia    40 yrs ago   History of pancreatitis    elevated lipase levels    HYPERLIPIDEMIA 01/26/2009   HYPERTENSION 01/26/2009   OSTEOARTHRITIS, GENERALIZED, MULTIPLE JOINTS 01/06/2010   occ. lower back pain, s/p cervical neck surgery"stiffness" remains   Transfusion history    infant "anemia"    Past Surgical History:  Procedure Laterality Date   APPENDECTOMY  ~ 1959   BACK SURGERY     CERVICAL DISC ARTHROPLASTY N/A 07/30/2018   Procedure: Cervical five-six Cervical six-seven Artificial disc replacement;  Surgeon: Elsner, Henry, MD;  [facility];  Service: Neurosurgery;  Laterality: N/A;   CERVICAL LAMINECTOMY  1997   C 4 and C 5   CHOLECYSTECTOMY N/A 04/15/2014   Procedure: LAPAROSCOPIC CHOLECYSTECTOMY WITH INTRAOPERATIVE CHOLANGIOGRAM;  Surgeon: Todd Gerkin, MD;  Location: [facility];  Service: General;  Laterality: N/A;   COLONOSCOPY     CORONARY BALLOON ANGIOPLASTY N/A 02/26/2019   Procedure: CORONARY BALLOON ANGIOPLASTY;  Surgeon: Harding, David W, MD;  [facility];  Service: Cardiovascular -> scoring balloon PTCA (Wolverine 2.0 mm) of ostial D2 85% reducing to 20-30%.   CORONARY STENT INTERVENTION N/A 02/26/2019   Procedure: CORONARY STENT INTERVENTION;  Surgeon: Harding, David W, MD;  [facility];; PCI ostial RPDA (initial attempt of PTCA only led to small local tear/dissection covered with   stent) Resolute Onyx 2.5 mm x 15 mm (2.6 mm   CORONARY STENT INTERVENTION N/A 07/05/2019   Procedure: CORONARY STENT INTERVENTION;  Surgeon: Harding, David W, MD;  [facility];  Service: Cardiovascular;; ostial D1 95% (restenosis of PTCA site) -> DES PCI RESOLUTE ONYX 2.25 mm x 50 mm (2.6 mm) positioned to avoid the true ostium.    ESOPHAGOGASTRODUODENOSCOPY N/A 03/13/2014   Procedure: ESOPHAGOGASTRODUODENOSCOPY (EGD);  Surgeon: Salem F Ganem, MD;  [facility];  Service: Endoscopy;  Laterality: N/A;   INTRAVASCULAR PRESSURE  WIRE/FFR STUDY N/A 02/26/2019   Procedure: INTRAVASCULAR PRESSURE WIRE/FFR STUDY;  Surgeon: Harding, David W, MD;  [facility];  Service: Cardiovascular;; mCx ~65% - DFR 0.92, FR 0.82 - BORDERLINE--> MED Rx   LEFT HEART CATH AND CORONARY ANGIOGRAPHY N/A 02/26/2019   Procedure: LEFT HEART CATH AND CORONARY ANGIOGRAPHY;  Surgeon: Harding, David W, MD;  [facility];  EF 55-65%. mCx 65% (FFR 0.82 - Med Rx). D2 85% - Wolverine Scoring Balloon PTCA (2.0 mm). Ost rPDA - 90% -Resolute Onyx 2.5 x 15 (2.6 mm).    LEFT HEART CATH AND CORONARY ANGIOGRAPHY N/A 07/05/2019   Procedure: LEFT HEART CATH AND CORONARY ANGIOGRAPHY;  Surgeon: Harding, David W, MD;  [facility];; ostial D1 95% (restenosis of PTCA site) -> DES PCI.  PDA stent widely patent.  Mid CX stable 65% lesion.  Proximal RCA 40%.  Mid RCA 45%.  Normal EDP.   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   SHOULDER ARTHROSCOPY Bilateral    SHOULDER ARTHROSCOPY W/ ROTATOR CUFF REPAIR Bilateral 2004-2009   left 2004, right 2009   TOTAL HIP ARTHROPLASTY Left 03/04/2020   Procedure: TOTAL HIP ARTHROPLASTY ANTERIOR APPROACH;  Surgeon: Aluisio, Frank, MD;  [facility]: [facility];  Service: Orthopedics;  Laterality: Left;  100min   TOTAL HIP ARTHROPLASTY Right 09/30/2020   Procedure: TOTAL HIP ARTHROPLASTY ANTERIOR APPROACH;  Surgeon: Aluisio, Frank, MD;  [facility];  Service: Orthopedics;  Laterality: Right;  100min   TRANSTHORACIC ECHOCARDIOGRAM  02/2020; 10/01/2020   a) EF 55 to 60%.  GR 1 DD.  No R WMA.  Trivial MR.  Otherwise normal.; b) EF 55 to 60%.  Normal LV function and no wall motion abnormality.  Normal RV.  Normal valves..   VASECTOMY     WISDOM TOOTH EXTRACTION      Family History  Problem Relation Age of Onset   Hypertension Mother    Hypertension Father    Arthritis Sister        rheumatoid   Stroke Sister 60    Social History   Socioeconomic History   Marital status:  Married    Spouse name: Not on file   Number of children: Not on file   Years of education: Not on file   Highest education level: Not on file  Occupational History   Not on file  Tobacco Use   Smoking status: Former    Packs/day: 1.00    Years: 5.00    Pack years: 5.00    Types: Cigarettes    Quit date: 10/17/1968    Years since quitting: 52.6   Smokeless tobacco: Never  Vaping Use   Vaping Use: Never used  Substance and Sexual Activity   Alcohol use: Not Currently    Comment: "quit drinking ~ 10/1968   Drug use: No   Sexual activity: Not on file  Other Topics Concern     Not on file  Social History Narrative   Not on file   Social Determinants of Health   Financial Resource Strain: Low Risk    Difficulty of Paying Living Expenses: Not hard at all  Food Insecurity: No Food Insecurity   Worried About Running Out of Food in the Last Year: Never true   Ran Out of Food in the Last Year: Never true  Transportation Needs: No Transportation Needs   Lack of Transportation (Medical): No   Lack of Transportation (Non-Medical): No  Physical Activity: Insufficiently Active   Days of Exercise per Week: 3 days   Minutes of Exercise per Session: 30 min  Stress: No Stress Concern Present   Feeling of Stress : Not at all  Social Connections: Moderately Isolated   Frequency of Communication with Friends and Family: Three times a week   Frequency of Social Gatherings with Friends and Family: Three times a week   Attends Religious Services: Never   Active Member of Clubs or Organizations: No   Attends Club or Organization Meetings: Never   Marital Status: Married  Intimate Partner Violence: Not At Risk   Fear of Current or Ex-Partner: No   Emotionally Abused: No   Physically Abused: No   Sexually Abused: No    Outpatient Medications Prior to Visit  Medication Sig Dispense Refill   acetaminophen (TYLENOL) 500 MG tablet TAKE TWO TABLETS BY MOUTH THREE TIMES A DAY AS NEEDED      Alirocumab (PRALUENT) 150 MG/ML SOAJ Inject 150 mg/mL into the skin every 14 (fourteen) days.     amLODipine (NORVASC) 10 MG tablet Take 1 tablet (10 mg total) by mouth daily. 90 tablet 1   aspirin EC 81 MG tablet Take 1 tablet (81 mg total) by mouth daily. Swallow whole. ( start after stopping clopidogrel 75 mg) 90 tablet 3   bimatoprost (LUMIGAN) 0.03 % ophthalmic solution Place 1 drop into both eyes at bedtime.     Brinzolamide-Brimonidine (SIMBRINZA) 1-0.2 % SUSP Place 1 drop into both eyes in the morning and at bedtime.     COVID-19 mRNA Vac-TriS, Pfizer, (PFIZER-BIONT COVID-19 VAC-TRIS) SUSP injection Inject into the muscle. 0.3 mL 0   ibuprofen (ADVIL) 200 MG tablet Take 200 mg by mouth every 6 (six) hours as needed.     isosorbide mononitrate (IMDUR) 30 MG 24 hr tablet TAKE 1 TABLET BY MOUTH IN  THE EVENING 90 tablet 3   losartan (COZAAR) 100 MG tablet Take 100 mg by mouth daily.     nitroGLYCERIN (NITROSTAT) 0.4 MG SL tablet Place 1 tablet (0.4 mg total) under the tongue every 5 (five) minutes as needed for chest pain. 25 tablet 3   traZODone (DESYREL) 50 MG tablet Take 50 mg by mouth at bedtime.     No facility-administered medications prior to visit.    Allergies  Allergen Reactions   Penicillins Itching    Has patient had a PCN reaction causing immediate rash, facial/tongue/throat swelling, SOB or lightheadedness with hypotension: no Has patient had a PCN reaction causing severe rash involving mucus membranes or skin necrosis: unkn Has patient had a PCN reaction that required hospitalization: no Has patient had a PCN reaction occurring within the last 10 years: no If all of the above answers are "NO", then may proceed with Cephalosporin use.  Tolerated Cephalosporin Date: 10/01/20.    ROS Review of Systems  Constitutional:  Negative for fatigue.  Eyes:  Negative for visual disturbance.  Respiratory:  Negative for cough, chest   tightness and shortness of breath.    Cardiovascular:  Negative for chest pain, palpitations and leg swelling.  Endocrine: Negative for polydipsia.  Genitourinary:  Negative for dysuria.  Neurological:  Negative for dizziness, syncope, weakness, light-headedness and headaches.     Objective:    Physical Exam Constitutional:      Appearance: He is well-developed.  HENT:     Right Ear: External ear normal.     Left Ear: External ear normal.  Eyes:     Pupils: Pupils are equal, round, and reactive to light.  Neck:     Thyroid: No thyromegaly.  Cardiovascular:     Rate and Rhythm: Normal rate and regular rhythm.  Pulmonary:     Effort: Pulmonary effort is normal. No respiratory distress.     Breath sounds: Normal breath sounds. No wheezing or rales.  Musculoskeletal:     Cervical back: Neck supple.  Skin:    Comments: He has approximately 1 x 1 cm area that is hyperkeratotic and scaly mid parietal scalp slightly tender to palpation.  Neurological:     Mental Status: He is alert and oriented to person, place, and time.    BP 130/62 (BP Location: Left Arm, Patient Position: Sitting, Cuff Size: Normal)   Pulse 70   Temp 98.3 F (36.8 C) (Oral)   Ht 6' (1.829 m)   Wt 188 lb (85.3 kg)   SpO2 99%   BMI 25.50 kg/m  Wt Readings from Last 3 Encounters:  05/18/21 188 lb (85.3 kg)  12/10/20 192 lb (87.1 kg)  12/02/20 191 lb 11.2 oz (87 kg)     Health Maintenance Due  Topic Date Due   FOOT EXAM  05/03/2019   HEMOGLOBIN A1C  11/03/2020   INFLUENZA VACCINE  03/15/2021    There are no preventive care reminders to display for this patient.  Lab Results  Component Value Date   TSH 0.61 05/06/2019   Lab Results  Component Value Date   WBC 20.9 (H) 10/01/2020   HGB 12.7 (L) 10/01/2020   HCT 36.6 (L) 10/01/2020   MCV 92.9 10/01/2020   PLT 206 10/01/2020   Lab Results  Component Value Date   NA 144 12/11/2020   K 3.5 12/11/2020   CO2 22 12/11/2020   GLUCOSE 101 (H) 12/11/2020   BUN 10 12/11/2020    CREATININE 0.85 12/11/2020   BILITOT 0.3 12/11/2020   ALKPHOS 129 (H) 12/11/2020   AST 13 12/11/2020   ALT 12 12/11/2020   PROT 6.1 12/11/2020   ALBUMIN 3.9 12/11/2020   CALCIUM 8.9 12/11/2020   ANIONGAP 9 10/01/2020   EGFR 91 12/11/2020   GFR 88.33 05/06/2019   Lab Results  Component Value Date   CHOL 105 12/11/2020   Lab Results  Component Value Date   HDL 44 12/11/2020   Lab Results  Component Value Date   LDLCALC 45 12/11/2020   Lab Results  Component Value Date   TRIG 76 12/11/2020   Lab Results  Component Value Date   CHOLHDL 2.4 12/11/2020   Lab Results  Component Value Date   HGBA1C 5.8 (H) 05/06/2020      Assessment & Plan:   #1 hypertension stable.  Continue current medications.  #2 hyperlipidemia with goal LDL less than 70.  Patient on Praluent.  He had lipids done about 6 months ago per cardiology and these were well controlled.  Patient is requesting repeat lipids today.  He did have mildly elevated alkaline phosphatase and is fasting   today and we will repeat this as well.  #3 history of chronic insomnia stable on trazodone 50 mg nightly  #4 actinic keratosis mid parietal scalp.  We discussed the fact that these can degenerate into squamous cell carcinoma.  Recommend treatment.  We discussed risk and benefits of treat with liquid nitrogen including risk of blistering, low risk of infection and low risk of scarring.  Patient consented.  Tolerated treatment well.  #5 history of prediabetes range blood sugars.  A1c last year 5.8%.  Recheck A1c today.  #6 health maintenance.  Patient needs flu vaccine.  He consents.  High-dose flu vaccine given.    Follow-up: No follow-ups on file.    Bruce Burchette, MD 

## 2021-05-19 DIAGNOSIS — M25551 Pain in right hip: Secondary | ICD-10-CM | POA: Diagnosis not present

## 2021-05-21 DIAGNOSIS — M25551 Pain in right hip: Secondary | ICD-10-CM | POA: Diagnosis not present

## 2021-05-26 DIAGNOSIS — M25551 Pain in right hip: Secondary | ICD-10-CM | POA: Diagnosis not present

## 2021-06-01 ENCOUNTER — Telehealth (INDEPENDENT_AMBULATORY_CARE_PROVIDER_SITE_OTHER): Payer: Medicare Other | Admitting: Family Medicine

## 2021-06-01 ENCOUNTER — Encounter: Payer: Self-pay | Admitting: Family Medicine

## 2021-06-01 VITALS — Temp 98.1°F | Wt 180.0 lb

## 2021-06-01 DIAGNOSIS — I209 Angina pectoris, unspecified: Secondary | ICD-10-CM | POA: Diagnosis not present

## 2021-06-01 DIAGNOSIS — U071 COVID-19: Secondary | ICD-10-CM | POA: Diagnosis not present

## 2021-06-01 MED ORDER — BENZONATATE 100 MG PO CAPS: 100.0000 mg | ORAL_CAPSULE | Freq: Three times a day (TID) | ORAL | 0 refills | Status: DC | PRN

## 2021-06-01 MED ORDER — MOLNUPIRAVIR EUA 200MG CAPSULE: 4.0000 | ORAL_CAPSULE | Freq: Two times a day (BID) | ORAL | 0 refills | Status: AC

## 2021-06-01 NOTE — Progress Notes (Signed)
Virtual Visit via Video Note  I connected with Luis Guzman  on 06/01/21 at  3:40 PM EDT by a video enabled telemedicine application and verified that I am speaking with the correct person using two identifiers.  Location patient: home, [place] Location provider:work or home office Persons participating in the virtual visit: patient, provider  I discussed the limitations of evaluation and management by telemedicine and the availability of in person appointments. The patient expressed understanding and agreed to proceed.   HPI:  Acute telemedicine visit for Covid19: -wife tested positive for covid as well -started getting sick about 4 days ago -tested positive for covid today -Symptoms include: chills, sinus congestion, cough -feeling better now with just a cough -Denies:CP, SOB, NVD, inability to eat/drink/get out of bed -Pertinent past medical history: see below-had some chest discomfort over a week ago but it resolved (thinks was unrelated); has not had any chest pain since, sees cardiology, when took ntg resolved -Pertinent medication allergies: Allergies  Allergen Reactions   Penicillins Itching    Has patient had a PCN reaction causing immediate rash, facial/tongue/throat swelling, SOB or lightheadedness with hypotension: no Has patient had a PCN reaction causing severe rash involving mucus membranes or skin necrosis: unkn Has patient had a PCN reaction that required hospitalization: no Has patient had a PCN reaction occurring within the last 10 years: no If all of the above answers are "NO", then may proceed with Cephalosporin use.  Tolerated Cephalosporin Date: 10/01/20.  -COVID-19 vaccine status: 2 doses and 2 booster -had labs recently  ROS: See pertinent positives and negatives per HPI.  Past Medical History:  Diagnosis Date   AAA (abdominal aortic aneurysm)    Anemia    as infant history of   CAD S/P PTCA & DES PCI - for Progressive Angina 02/26/2019   Cath-PCI 02/26/2019: EF  55-65%. mCx 65% (FFR 0.82 - Med Rx). D2 85% - Wolverine Scoring Balloon PTCA (2.0 mm). Ost rPDA - 90% -Resolute Onyx 2.5 x 15 (2.6 mm). 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 lower back pain, s/p cervical neck surgery"stiffness" remains   Transfusion history    infant "anemia"    Past Surgical History:  Procedure Laterality Date   APPENDECTOMY  ~ [place] N/A 07/30/2018   Procedure: Cervical five-six Cervical six-seven Artificial disc replacement;  Surgeon: Kristeen Miss, MD;  Location: [facility];  Service: Neurosurgery;  Laterality: N/A;   CERVICAL LAMINECTOMY  1997   C 4 and C 5   CHOLECYSTECTOMY N/A 04/15/2014   Procedure: LAPAROSCOPIC CHOLECYSTECTOMY WITH INTRAOPERATIVE CHOLANGIOGRAM;  Surgeon: Armandina Gemma, MD;  [facility]: [facility];  Service: General;  Laterality: N/A;   COLONOSCOPY     CORONARY BALLOON ANGIOPLASTY N/A 02/26/2019   Procedure: CORONARY BALLOON ANGIOPLASTY;  Surgeon: Leonie Man, MD;  Location: [place] [facility];  Service: Cardiovascular -> scoring balloon PTCA (Wolverine 2.0 mm) of ostial D2 85% reducing to 20-30%.   CORONARY STENT INTERVENTION  N/A 02/26/2019   Procedure: CORONARY STENT INTERVENTION;  Surgeon: Leonie Man, MD;  Location: [place] [facility];; PCI ostial RPDA (initial attempt of PTCA only led to small local tear/dissection covered with stent) Resolute Onyx 2.5 mm x 15 mm (2.6 mm   CORONARY STENT INTERVENTION N/A 07/05/2019   Procedure: CORONARY STENT INTERVENTION;  Surgeon: Leonie Man, MD;   Location: [place] [facility];  Service: Cardiovascular;; ostial D1 95% (restenosis of PTCA site) -> DES PCI RESOLUTE ONYX 2.25 mm x 50 mm (2.6 mm) positioned to avoid the true ostium.    ESOPHAGOGASTRODUODENOSCOPY N/A 03/13/2014   Procedure: ESOPHAGOGASTRODUODENOSCOPY (EGD);  Surgeon: Wonda Horner, MD;  Location: [facility];  Service: Endoscopy;  Laterality: N/A;   INTRAVASCULAR PRESSURE WIRE/FFR STUDY N/A 02/26/2019   Procedure: INTRAVASCULAR PRESSURE WIRE/FFR STUDY;  Surgeon: Leonie Man, MD;  Location: [place] [facility];  Service: Cardiovascular;; mCx ~65% - DFR 0.92, FR 0.82 - BORDERLINE--> MED Rx   LEFT HEART CATH AND CORONARY ANGIOGRAPHY N/A 02/26/2019   Procedure: LEFT HEART CATH AND CORONARY ANGIOGRAPHY;  Surgeon: Leonie Man, MD;  Location: [place] [facility];  EF 55-65%. mCx 65% (FFR 0.82 - Med Rx). D2 85% - Wolverine Scoring Balloon PTCA (2.0 mm). Ost rPDA - 90% -Resolute Onyx 2.5 x 15 (2.6 mm).    LEFT HEART CATH AND CORONARY ANGIOGRAPHY N/A 07/05/2019   Procedure: LEFT HEART CATH AND CORONARY ANGIOGRAPHY;  Surgeon: Leonie Man, MD;  Location: [facility];; ostial D1 95% (restenosis of PTCA site) -> DES PCI.  PDA stent widely patent.  Mid CX stable 65% lesion.  Proximal RCA 40%.  Mid RCA 45%.  Normal EDP.   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   SHOULDER ARTHROSCOPY Bilateral    SHOULDER ARTHROSCOPY W/ ROTATOR CUFF REPAIR Bilateral 2004-2009   left 2004, right 2009   TOTAL HIP ARTHROPLASTY Left 03/04/2020   Procedure: TOTAL HIP ARTHROPLASTY ANTERIOR APPROACH;  Surgeon: Gaynelle Arabian, MD;  [facility];  Service: Orthopedics;  Laterality: Left;  131min   TOTAL HIP ARTHROPLASTY Right 09/30/2020   Procedure: TOTAL HIP ARTHROPLASTY ANTERIOR APPROACH;  Surgeon: Gaynelle Arabian, MD;  [facility]: [facility];  Service: Orthopedics;  Laterality: Right;  182min   TRANSTHORACIC ECHOCARDIOGRAM  02/2020; 10/01/2020   a) EF 55 to 60%.  GR 1 DD.  No R WMA.   Trivial MR.  Otherwise normal.; b) EF 55 to 60%.  Normal LV function and no wall motion abnormality.  Normal RV.  Normal valves.[name]   VASECTOMY     WISDOM TOOTH EXTRACTION       Current Outpatient Medications:    acetaminophen (TYLENOL) 500 MG tablet, TAKE TWO TABLETS BY MOUTH THREE TIMES A DAY AS NEEDED, Disp: , Rfl:    Alirocumab (PRALUENT) 150 MG/ML SOAJ, Inject 150 mg/mL into the skin every 14 (fourteen) days., Disp: , Rfl:    amLODipine (NORVASC) 10 MG tablet, Take 1 tablet (10 mg total) by mouth daily., Disp: 90 tablet, Rfl: 1   aspirin EC 81 MG tablet, Take 1 tablet (81 mg total) by mouth daily. Swallow whole. ( start after stopping clopidogrel 75 mg), Disp: 90 tablet, Rfl: 3   benzonatate (TESSALON PERLES) 100 MG capsule, Take 1 capsule (100 mg total) by mouth 3 (three) times daily as needed., Disp: 20 capsule, Rfl: 0   bimatoprost (LUMIGAN) 0.03 % ophthalmic solution, Place 1 drop into both eyes at bedtime., Disp: , Rfl:  Brinzolamide-Brimonidine (SIMBRINZA) 1-0.2 % SUSP, Place 1 drop into both eyes in the morning and at bedtime., Disp: , Rfl:    COVID-19 mRNA Vac-TriS, Pfizer, (PFIZER-BIONT COVID-19 VAC-TRIS) SUSP injection, Inject into the muscle., Disp: 0.3 mL, Rfl: 0   ibuprofen (ADVIL) 200 MG tablet, Take 200 mg by mouth every 6 (six) hours as needed., Disp: , Rfl:    isosorbide mononitrate (IMDUR) 30 MG 24 hr tablet, TAKE 1 TABLET BY MOUTH IN  THE EVENING, Disp: 90 tablet, Rfl: 3   losartan (COZAAR) 100 MG tablet, Take 100 mg by mouth daily., Disp: , Rfl:    molnupiravir EUA (LAGEVRIO) 200 mg CAPS capsule, Take 4 capsules (800 mg total) by mouth 2 (two) times daily for 5 days., Disp: 40 capsule, Rfl: 0   nitroGLYCERIN (NITROSTAT) 0.4 MG SL tablet, Place 1 tablet (0.4 mg total) under the tongue every 5 (five) minutes as needed for chest pain., Disp: 25 tablet, Rfl: 3   traZODone (DESYREL) 50 MG tablet, Take 50 mg by mouth at bedtime., Disp: , Rfl:   EXAM:  VITALS per patient if  applicable:  GENERAL: alert, oriented, appears well and in no acute distress  HEENT: atraumatic, conjunttiva clear, no obvious abnormalities on inspection of external nose and ears  NECK: normal movements of the head and neck  LUNGS: on inspection no signs of respiratory distress, breathing rate appears normal, no obvious gross SOB, gasping or wheezing  CV: no obvious cyanosis  MS: moves all visible extremities without noticeable abnormality  PSYCH/NEURO: pleasant and cooperative, no obvious depression or anxiety, speech and thought processing grossly intact  ASSESSMENT AND PLAN:  Discussed the following assessment and plan:  COVID-19   Discussed treatment options (infusions and oral options and risk of drug interactions), ideal treatment window, potential complications, isolation and precautions for COVID-19.  Discussed possibility of rebound with antivirals and the need to reisolate if it should occur for 5 days. Checked for/reviewed any labs done in the last 90 days with GFR listed in HPI if available. After lengthy discussion, the patient opted for treatment with molnupiravir due to being higher risk for complications of covid or severe disease, concern about drug interactions with other antiviral and other factors. Discussed EUA status of this drug and the fact that there is preliminary limited knowledge of risks/interactions/side effects per EUA document vs possible benefits and precautions. This information was shared with patient during the visit and also was provided in patient instructions.The patient declined referral for Covid outpatient treatment at this time. The patient did want a prescription for cough, Tessalon Rx sent.  Other symptomatic care measures summarized in patient instructions.Advised to contact cardiologist and/or seek inperson care if any chest pain recurs.  Advised to seek prompt in person care if worsening, new symptoms arise, or if is not improving with  treatment. Discussed options for inperson care if PCP office not available. Did let this patient know that I only do telemedicine on Tuesdays and Thursdays for [place]. Advised to schedule follow up visit with PCP or [facility] if any further questions or concerns to avoid delays in care.   I discussed the assessment and treatment plan with the patient. The patient was provided an opportunity to ask questions and all were answered. The patient agreed with the plan and demonstrated an understanding of the instructions.     Lucretia Kern, DO

## 2021-06-01 NOTE — Patient Instructions (Signed)
HOME CARE TIPS:    -I sent the medication(s) we discussed to your pharmacy: Meds ordered this encounter  Medications   molnupiravir EUA (LAGEVRIO) 200 mg CAPS capsule    Sig: Take 4 capsules (800 mg total) by mouth 2 (two) times daily for 5 days.    Dispense:  40 capsule    Refill:  0   benzonatate (TESSALON PERLES) 100 MG capsule    Sig: Take 1 capsule (100 mg total) by mouth 3 (three) times daily as needed.    Dispense:  20 capsule    Refill:  0     -I sent in the [place] treatment or referral you requested per our discussion. Please see the information provided below and discuss further with the pharmacist/treatment team.   -there is a chance of rebound illness after finishing your treatment. If you become sick again please isolate for an additional 5 days, plus 5 more days of masking.   -can use tylenol if needed for fevers, aches and pains per instructions  -can use nasal saline a few times per day if you have nasal congestion  -stay hydrated, drink plenty of fluids and eat small healthy meals - avoid dairy  -follow up with your doctor in 2-3 days unless improving and feeling better  -stay home while sick, except to seek medical care. If you have COVID19, ideally it would be best to stay home for a full 10 days since the onset of symptoms PLUS one day of no fever and feeling better. Wear a good mask that fits snugly (such as N95 or KN95) if around others to reduce the risk of transmission.  It was nice to meet you today, and I really hope you are feeling better soon. I help [facility] out with telemedicine visits on Tuesdays and Thursdays and am available for visits on those days. If you have any concerns or questions following this visit please schedule a follow up visit with your Primary Care doctor or seek care at a local urgent care clinic to avoid delays in care.    Seek in person care or schedule a follow up video visit promptly if your symptoms worsen, new concerns  arise or you are not improving with treatment. Call 911 and/or seek emergency care if your symptoms are severe or life threatening.    Fact Sheet for Patients And Caregivers Emergency Use Authorization (EUA) Of LAGEVRIOT (molnupiravir) capsules For Coronavirus Disease 2019 (COVID-19)  What is the most important information I should know about LAGEVRIO? LAGEVRIO may cause serious side effects, including: ? LAGEVRIO may cause harm to your unborn baby. It is not known if LAGEVRIO will harm your baby if you take LAGEVRIO during pregnancy. o LAGEVRIO is not recommended for use in pregnancy. o LAGEVRIO has not been studied in pregnancy. LAGEVRIO was studied in pregnant animals only. When LAGEVRIO was given to pregnant animals, LAGEVRIO caused harm to their unborn babies. o You and your healthcare provider may decide that you should take LAGEVRIO during pregnancy if there are no other COVID-19 treatment options approved or authorized by the FDA that are accessible or clinically appropriate for you. o If you and your healthcare provider decide that you should take LAGEVRIO during pregnancy, you and your healthcare provider should discuss the known and potential benefits and the potential risks of taking LAGEVRIO during pregnancy. For individuals who are able to become pregnant: ? You should use a reliable method of birth control (contraception) consistently and correctly during treatment with LAGEVRIO  and for 4 days after the last dose of LAGEVRIO. Talk to your healthcare provider about reliable birth control methods. ? Before starting treatment with [facility] your healthcare provider may do a pregnancy test to see if you are pregnant before starting treatment with LAGEVRIO. ? Tell your healthcare provider right away if you become pregnant or think you may be pregnant during treatment with LAGEVRIO. Pregnancy Surveillance Program: ? There is a pregnancy surveillance program for individuals who  take LAGEVRIO during pregnancy. The purpose of this program is to collect information about the health of you and your baby. Talk to your healthcare provider about how to take part in this program. ? If you take LAGEVRIO during pregnancy and you agree to participate in the pregnancy surveillance program and allow your healthcare provider to share your information with [place], then your healthcare provider will report your use of [place] during pregnancy to [place]. by calling [phone] or [email or web address]. For individuals who are sexually active with partners who are able to become pregnant: ? It is not known if LAGEVRIO can affect sperm. While the risk is regarded as low, animal studies to fully assess the potential for LAGEVRIO to affect the babies of males treated with LAGEVRIO have not been completed. A reliable method of birth control (contraception) should be used consistently and correctly during treatment with LAGEVRIO and for at least 3 months after the last dose. The risk to sperm beyond 3 months is not known. Studies to understand the risk to sperm beyond 3 months are ongoing. Talk to your healthcare provider about reliable birth control methods. Talk to your healthcare provider if you have questions or concerns about how LAGEVRIO may affect sperm. You are being given this fact sheet because your healthcare provider believes it is necessary to provide you with LAGEVRIO for the treatment of adults with mild-to-moderate coronavirus disease 2019 (COVID-19) with positive results of direct SARS-CoV-2 viral testing, and who are at high risk for progression to severe COVID-19 including hospitalization or death, and for whom other COVID-19 treatment options approved or authorized by the FDA are not accessible or clinically appropriate. The U.S. Food and Drug Administration (FDA) has issued an Emergency Use Authorization (EUA) to make LAGEVRIO  available during the COVID-19 pandemic (for more details about an EUA please see "What is an Emergency Use Authorization?" at the end of this document). LAGEVRIO is not an FDA-approved medicine in the [place]. Read this Fact Sheet for information about LAGEVRIO. Talk to your healthcare provider about your options if you have any questions. It is your choice to take LAGEVRIO.  What is COVID-19? COVID-19 is caused by a virus called a coronavirus. You can get COVID-19 through close contact with another person who has the virus. COVID-19 illnesses have ranged from very mild-to-severe, including illness resulting in death. While information so far suggests that most COVID-19 illness is mild, serious illness can happen and may cause some of your other medical conditions to become worse. Older people and people of all ages with severe, long lasting (chronic) medical conditions like heart disease, lung disease and diabetes, for example seem to be at higher risk of being hospitalized for COVID-19.  What is LAGEVRIO? LAGEVRIO is an investigational medicine used to treat mild-to-moderate COVID-19 in adults: ? with positive results of direct SARS-CoV-2 viral testing, and ? who are at high risk for progression to severe COVID-19 including hospitalization or death, and for whom other COVID-19 treatment options approved  or authorized by the FDA are not accessible or clinically appropriate. The FDA has authorized the emergency use of LAGEVRIO for the treatment of mild-tomoderate COVID-19 in adults under an EUA. For more information on EUA, see the "What is an Emergency Use Authorization (EUA)?" section at the end of this Fact Sheet. LAGEVRIO is not authorized: ? for use in people less than 75 years of age. ? for prevention of COVID-19. ? for people needing hospitalization for COVID-19. ? for use for longer than 5 consecutive days.  What should I tell my healthcare provider before I take  LAGEVRIO? Tell your healthcare provider if you: ? Have any allergies ? Are breastfeeding or plan to breastfeed ? Have any serious illnesses ? Are taking any medicines (prescription, over-the-counter, vitamins, or herbal products).  How do I take LAGEVRIO? ? Take LAGEVRIO exactly as your healthcare provider tells you to take it. ? Take 4 capsules of LAGEVRIO every 12 hours (for example, at 8 am and at 8 pm) ? Take LAGEVRIO for 5 days. It is important that you complete the full 5 days of treatment with LAGEVRIO. Do not stop taking LAGEVRIO before you complete the full 5 days of treatment, even if you feel better. ? Take LAGEVRIO with or without food. ? You should stay in isolation for as long as your healthcare provider tells you to. Talk to your healthcare provider if you are not sure about how to properly isolate while you have COVID-19. ? Swallow LAGEVRIO capsules whole. Do not open, break, or crush the capsules. If you cannot swallow capsules whole, tell your healthcare provider. ? What to do if you miss a dose: o If it has been less than 10 hours since the missed dose, take it as soon as you remember o If it has been more than 10 hours since the missed dose, skip the missed dose and take your dose at the next scheduled time. ? Do not double the dose of LAGEVRIO to make up for a missed dose.  What are the important possible side effects of LAGEVRIO? ? See, "What is the most important information I should know about LAGEVRIO?" ? Allergic Reactions. Allergic reactions can happen in people taking LAGEVRIO, even after only 1 dose. Stop taking LAGEVRIO and call your healthcare provider right away if you get any of the following symptoms of an allergic reaction: o hives o rapid heartbeat o trouble swallowing or breathing o swelling of the mouth, lips, or face o throat tightness o hoarseness o skin rash The most common side effects of LAGEVRIO are: ? diarrhea ? nausea ?  dizziness These are not all the possible side effects of LAGEVRIO. Not many people have taken LAGEVRIO. Serious and unexpected side effects may happen. This medicine is still being studied, so it is possible that all of the risks are not known at this time.  What other treatment choices are there?  Veklury (remdesivir) is FDA-approved as an intravenous (IV) infusion for the treatment of mildto-moderate YSAYT-01 in certain adults and children. Talk with your doctor to see if Marijean Heath is appropriate for you. Like LAGEVRIO, FDA may also allow for the emergency use of other medicines to treat people with COVID-19. Go to LacrosseProperties.si for more information. It is your choice to be treated or not to be treated with LAGEVRIO. Should you decide not to take it, it will not change your standard medical care.  What if I am breastfeeding? Breastfeeding is not recommended during treatment with LAGEVRIO and  for 4 days after the last dose of LAGEVRIO. If you are breastfeeding or plan to breastfeed, talk to your healthcare provider about your options and specific situation before taking LAGEVRIO.  How do I report side effects with LAGEVRIO? Contact your healthcare provider if you have any side effects that bother you or do not go away. Report side effects to FDA MedWatch at SmoothHits.hu or call 1-800-FDA-1088 (1- 719-075-8148).  How should I store [place]? ? Store LAGEVRIO capsules at room temperature between 13F to 61F (20C to 25C). ? Keep LAGEVRIO and all medicines out of the reach of children and pets. How can I learn more about COVID-19? ? Ask your healthcare provider. ? Visit SeekRooms.co.uk ? Contact your local or state public health department. ? Call [place] at 901-800-8416 (toll free in the [place] ? Visit www.molnupiravir.com  What Is an Emergency Use  Authorization (EUA)? The [place] FDA has made [place] available under an emergency access mechanism called an Emergency Use Authorization (EUA) The EUA is supported by a Present[place], broadcasting Health and Human Service (HHS) declaration that circumstances exist to justify emergency use of drugs and biological products during the COVID-19 pandemic. LAGEVRIO for the treatment of mild-to-moderate COVID-19 in adults with positive results of direct SARS-CoV-2 viral testing, who are at high risk for progression to severe COVID-19, including hospitalization or death, and for whom alternative COVID-19 treatment options approved or authorized by FDA are not accessible or clinically appropriate, has not undergone the same type of review as an FDA-approved product. In issuing an EUA under the XYVOP-92 public health emergency, the FDA has determined, among other things, that based on the total amount of scientific evidence available including data from adequate and well-controlled clinical trials, if available, it is reasonable to believe that the product may be effective for diagnosing, treating, or preventing COVID-19, or a serious or life-threatening disease or condition caused by COVID-19; that the known and potential benefits of the product, when used to diagnose, treat, or prevent such disease or condition, outweigh the known and potential risks of such product; and that there are no adequate, approved, and available alternatives.  All of these criteria must be met to allow for the product to be used in the treatment of patients during the COVID-19 pandemic. The EUA for LAGEVRIO is in effect for the duration of the COVID-19 declaration justifying emergency use of LAGEVRIO, unless terminated or revoked (after which LAGEVRIO may no longer be used under the EUA). For patent information: http://rogers.info/ Copyright  2021-2022 [place], [place] and its affiliates. All rights  reserved. [id number] Revised: March 2022

## 2021-06-04 ENCOUNTER — Telehealth: Payer: Self-pay | Admitting: Pharmacist

## 2021-06-04 NOTE — Chronic Care Management (AMB) (Signed)
    Chronic Care Management Pharmacy Assistant   Name: [name]  MRN: [id number] DOB: 09/09/1945  06/07/21 APPOINTMENT REMINDER   Luis Guzman was reminded to have all medications, supplements and any blood glucose and blood pressure readings available for review with Jeni Salles, Pharm. D, at his telephone visit on 06/07/21 at 9am.   Questions: Have you had any recent office visit or specialist visit outside of [place]? No.  Are there any concerns you would like to discuss during your office visit? Not at this time.   Are you having any problems obtaining your medications? No issues.   If patient has any PAP medications ask if they are having any problems getting their PAP medication or refill? No patient assistance at this time.   Notes: Spoke with Angie at [facility] she stated patient has not had losartan filled there. Patient states he gets all medications filled at the [place] now.  Care Gaps:  AWV - scheduled for 05/10/22  Foot exam - overdue since 05/03/2019  Star Rating Drug:  Losartan 100mg  - [facility] at the [facility]  Any gaps in medications fill history? No.  [name]  Clinical Pharmacist Assistant [phone]

## 2021-06-07 ENCOUNTER — Ambulatory Visit (INDEPENDENT_AMBULATORY_CARE_PROVIDER_SITE_OTHER): Payer: Medicare Other | Admitting: Pharmacist

## 2021-06-07 DIAGNOSIS — I10 Essential (primary) hypertension: Secondary | ICD-10-CM

## 2021-06-07 DIAGNOSIS — E785 Hyperlipidemia, unspecified: Secondary | ICD-10-CM

## 2021-06-07 NOTE — Patient Instructions (Addendum)
Hi Luis Guzman,  It was great to catch up with you again! I attached some tips that might be helpful for looking to see if there are changes you can make with your diet.  Please reach out to me if you have any questions or need anything before our follow up!  Best, Maddie  Jeni Salles, PharmD, [facility]     Visit Information   Goals Addressed   None    Patient Care Plan: CCM Pharmacy Care Plan     Problem Identified: Problem: Hypertension, Hyperlipidemia, Coronary Artery Disease and Insomnia, Glaucoma      Long-Range Goal: Patient-Specific Goal   Start Date: 12/07/2020  Expected End Date: 12/07/2021  Recent Progress: On track  Priority: High  Note:   Current Barriers:  Unable to independently monitor therapeutic efficacy Unable to achieve control of cholesterol   Pharmacist Clinical Goal(s):  Patient will achieve adherence to monitoring guidelines and medication adherence to achieve therapeutic efficacy achieve control of cholesterol as evidenced by next lipid panel  through collaboration with PharmD and provider.   Interventions: 1:1 collaboration with Eulas Post, MD regarding development and update of comprehensive plan of care as evidenced by provider attestation and co-signature Inter-disciplinary care team collaboration (see longitudinal plan of care) Comprehensive medication review performed; medication list updated in electronic medical record  Hypertension (BP goal <130/80) -Uncontrolled (based on office readings) -Current treatment: Losartan 100 mg 1 tablet daily Amlodipine 10 mg 1 tablet daily -Medications previously tried: HCTZ (kidney function), valsartan (formulary change) -Current home readings: 140s/70s, 123/63, 131/70 (checking every day) -Current dietary habits: patient doesn't use much salt and does not eat frozen or canned foods  -Current exercise habits: using an elliptical for  30 minutes a day but doing some physical therapy -Denies hypotensive/hypertensive symptoms -Educated on Exercise goal of 150 minutes per week; Importance of home blood pressure monitoring; Proper BP monitoring technique; -Counseled to monitor BP at home weekly, document, and provide log at future appointments -Counseled on diet and exercise extensively Recommended to continue current medication  Hyperlipidemia: (LDL goal < 70) -Controlled -Current treatment: Praluent 150 mg/mL inject every 14 days -Medications previously tried: Repatha (formulary change), statins (muscle pain), Zetia -Current dietary patterns: doesn't eat a lot of sweets -Current exercise habits: hasn't been doing much for the last 2 months except PT exercises -Educated on Cholesterol goals;  Importance of limiting foods high in cholesterol; Exercise goal of 150 minutes per week; -Counseled on diet and exercise extensively Recommended to continue current medication  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) -Controlled -Current treatment  Isosorbide mononitrate 30 mg 1 tablet daily Clopidogrel 75 mg 1 tablet daily Nitroglycerin 0.4 mg SL 1 tablet as needed -Medications previously tried: none  -Recommended to continue current medication Patient hasn't had chest pain since the day of surgery  Insomnia (Goal: improve quality and quantity of sleep) -Not ideally controlled -Current treatment  Trazodone 50 mg 1 tablet at bedtime every night -Medications previously tried: melatonin (ineffective)  -Counseled on practicing good sleep hygiene by setting a sleep schedule and maintaining it, avoid excessive napping, following a nightly routine, avoiding screen time for 30-60 minutes before going to bed, and making the bedroom a cool, quiet and dark space  Glaucoma (Goal: lower intraocular pressure) -Controlled -Current treatment  Simbrinza 1-0.2% 1 drop in both eyes twice daily Lumigan 0.03% 1 drop in both eyes  at bedtime -Medications previously tried: none  -Recommended to continue current medication  Pre-diabetes (A1c goal <6.5%) -Controlled -Current medications: No medications -Medications previously tried: none  -Current home glucose readings fasting glucose: does not need to check post prandial glucose: does not need to check -Denies hypoglycemic/hyperglycemic symptoms -Current meal patterns: lots of meat; sometimes 1-2 meals a day breakfast: n/a lunch: n/a dinner: sometimes the biggest snacks: n/a drinks: n/a -Current exercise: doing physical therapy -Educated on Carbohydrate counting and/or plate method -Counseled to check feet daily and get yearly eye exams -Counseled on diet and exercise extensively  Health Maintenance -Vaccine gaps: none -Current therapy:  Tylenol 500 mg - taking 2 daily - depends on the need -Educated on Cost vs benefit of each product must be carefully weighed by individual consumer -Patient is satisfied with current therapy and denies issues -Recommended to continue current medication  Patient Goals/Self-Care Activities Patient will:  - check blood pressure weekly, document, and provide at future appointments target a minimum of 150 minutes of moderate intensity exercise weekly  Follow Up Plan: Telephone follow up appointment with care management team member scheduled for: 1 year       Patient verbalizes understanding of instructions provided today and agrees to view in [place].  Telephone follow up appointment with pharmacy team member scheduled for:1 year  Viona Gilmore, [facility]

## 2021-06-07 NOTE — Progress Notes (Signed)
Chronic Care Management Pharmacy Note  06/07/2021 Name:  Luis Guzman MRN:  [id number] DOB:  07-26-1946  Summary: BP is mostly at goal < 130/80 per home readings LDL is at goal < 70   Recommendations/Changes made from today's visit: -Recommended dietary changes with pre-diabetes (increasing meal frequency and decreasing meal size)   Plan: Follow up BP assessment in 3-4 months  Subjective: Luis Guzman is an 75 y.o. year old male who is a primary patient of Burchette, Alinda Sierras, MD.  The CCM team was consulted for assistance with disease management and care coordination needs.    Engaged with patient by telephone for follow up visit in response to provider referral for pharmacy case management and/or care coordination services.   Consent to Services:  The patient was given information about Chronic Care Management services, agreed to services, and gave verbal consent prior to initiation of services.  Please see initial visit note for detailed documentation.   Patient Care Team: Eulas Post, MD as PCP - General Leonie Man, MD as PCP - Cardiology (Cardiology) Kristeen Miss, MD as Consulting Physician (Neurosurgery) Garald Balding, MD as Consulting Physician (Orthopedic Surgery) Viona Gilmore, [facility] as Pharmacist (Pharmacist)  Recent office visits: 06/01/21 Colin Benton, DO: Patient presented for video visit for COVID 19 infection.  Prescribed molnupiravir and benzonatate.   05/18/21 Carolann Littler, MD: Patient presented for annual exam. A1c increased to 6.0%.   05/07/21 Randel Pigg, LPN: Patient presented for AWV.  Recent consult visits: 05/26/21 Ericka Pontiff, PT ([facility]): Unable to access notes.  04/28/21 Elsie Saas (neurosurgery): Unable to access notes.  04/01/21 Patient message (cardiology): Amlodipine increased to 10 mg daily.  03/16/21 Elsie Saas (neurosurgery): Unable to access notes.  12/11/20 Raynelle Bring, MD (urology): Unable to access  notes.  12/10/20 Glenetta Hew MD (Cardiology) - presented to clinic for follow up for CAD and other chronic conditions. Patient started on Aspirin 74m daily. Changed Trazodone from 246mat bedtime to 5041mt bedtime. Discontinued clopidogrel and doxycycline. Follow up in 7 months.  11/05/20 SeaFenton FoyA ([facility]): Unable to access notes.  10/14/20 DavGlenetta HewD (cardiology): Patient presented for chest pain and CAD follow up. Plan to decrease aspirin to 81 mg along with Plavix.  09/03/20 Telephone encounter (cardiology): Prescribed amlodipine 2.5 mg daily for elevated BP.  08/17/20 [facility]: Patient reported Valsartan changed to Losartan 100 mg daily, Repatha changed to Praulent 75 mg (then increased on 08/24/20 to 150 mg every 2 weeks), restarted  Potassium 20 meq.  Hospital visits: 09/30/20 Patient admitted for knee replacement.  Objective:  Lab Results  Component Value Date   CREATININE 0.95 05/18/2021   BUN 15 05/18/2021   GFR 78.50 05/18/2021   GFRNONAA >60 10/01/2020   GFRAA 77 09/02/2020   NA 143 05/18/2021   K 3.5 05/18/2021   CALCIUM 9.1 05/18/2021   CO2 28 05/18/2021   GLUCOSE 110 (H) 05/18/2021    Lab Results  Component Value Date/Time   HGBA1C 6.0 05/18/2021 08:40 AM   HGBA1C 5.8 (H) 05/06/2020 08:42 AM   GFR 78.50 05/18/2021 08:40 AM   GFR 88.33 05/06/2019 09:07 AM   MICROALBUR 1.2 08/19/2016 08:38 AM    Last diabetic Eye exam: No results found for: HMDIABEYEEXA  Last diabetic Foot exam: No results found for: HMDIABFOOTEX   Lab Results  Component Value Date   CHOL 101 05/18/2021   HDL 42.90 05/18/2021   LDLCALC 46 05/18/2021   LDLDIRECT 129.0 08/19/2016  TRIG 59.0 05/18/2021   CHOLHDL 2 05/18/2021    Hepatic Function Latest Ref Rng & Units 05/18/2021 12/11/2020 09/30/2020  Total Protein 6.0 - 8.3 g/dL 6.5 6.1 7.4  Albumin 3.5 - 5.2 g/dL 4.2 3.9 4.6  AST 0 - 37 U/L _0 ALT 0 - 53 U/L _1 Alk Phosphatase 39 - 117 U/L 117 129(H) 94  Total  Bilirubin 0.2 - 1.2 mg/dL 0.4 0.3 1.0  Bilirubin, Direct 0.0 - 0.2 mg/dL - - -    Lab Results  Component Value Date/Time   TSH 0.61 05/06/2019 09:07 AM   TSH 0.79 08/19/2016 08:38 AM    CBC Latest Ref Rng & Units 10/01/2020 09/30/2020 09/07/2020  WBC 4.0 - 10.5 K/uL 20.9(H) 8.9 8.3  Hemoglobin 13.0 - 17.0 g/dL 12.7(L) 16.5 15.8  Hematocrit 39.0 - 52.0 % 36.6(L) 47.7 46.8  Platelets 150 - 400 K/uL 206 206 221    No results found for: VD25OH  Clinical ASCVD: Yes  The ASCVD Risk score (Arnett DK, et al., 2019) failed to calculate for the following reasons:   The valid total cholesterol range is 130 to 320 mg/dL    Depression screen [facility] 2/9 05/07/2021 05/15/2020 05/06/2020  Decreased Interest 0 0 0  Down, Depressed, Hopeless 0 0 0  PHQ - 2 Score 0 0 0  Altered sleeping - 0 2  Tired, decreased energy - 0 2  Change in appetite - 0 0  Feeling bad or failure about yourself  - 0 0  Trouble concentrating - 0 0  Moving slowly or fidgety/restless - 0 0  Suicidal thoughts - 0 0  PHQ-9 Score - 0 4  Difficult doing work/chores - Not difficult at all Not difficult at all  Some recent data might be hidden      Social History   Tobacco Use  Smoking Status Former   Packs/day: 1.00   Years: 5.00   Pack years: 5.00   Types: Cigarettes   Quit date: 10/17/1968   Years since quitting: 52.6  Smokeless Tobacco Never   BP Readings from Last 3 Encounters:  05/18/21 130/62  12/10/20 (!) 144/82  12/02/20 (!) 150/80   Pulse Readings from Last 3 Encounters:  05/18/21 70  12/10/20 70  12/02/20 72   Wt Readings from Last 3 Encounters:  06/01/21 180 lb (81.6 kg)  05/18/21 188 lb (85.3 kg)  12/10/20 192 lb (87.1 kg)   BMI Readings from Last 3 Encounters:  06/01/21 24.41 kg/m  05/18/21 25.50 kg/m  12/10/20 26.04 kg/m    Assessment/Interventions: Review of patient past medical history, allergies, medications, health status, including review of consultants reports, laboratory and other  test data, was performed as part of comprehensive evaluation and provision of chronic care management services.   SDOH:  (Social Determinants of Health) assessments and interventions performed: No  SDOH Screenings   Alcohol Screen: Low Risk    Last Alcohol Screening Score (AUDIT): 0  Depression (PHQ2-9): Low Risk    PHQ-2 Score: 0  Financial Resource Strain: Low Risk    Difficulty of Paying Living Expenses: Not hard at all  Food Insecurity: No Food Insecurity   Worried About Charity fundraiser in the Last Year: Never true   Ran Out of Food in the Last Year: Never true  Housing: Low Risk    Last Housing Risk Score: 0  Physical Activity: Insufficiently Active   Days of Exercise per Week: 3 days   Minutes of Exercise  per Session: 30 min  Social Connections: Moderately Isolated   Frequency of Communication with Friends and Family: Three times a week   Frequency of Social Gatherings with Friends and Family: Three times a week   Attends Religious Services: Never   Active Member of Clubs or Organizations: No   Attends Archivist Meetings: Never   Marital Status: Married  Stress: No Stress Concern Present   Feeling of Stress : Not at all  Tobacco Use: Medium Risk   Smoking Tobacco Use: Former   Smokeless Tobacco Use: Never   Passive Exposure: Not on file  Transportation Needs: No Transportation Needs   Lack of Transportation (Medical): No   Lack of Transportation (Non-Medical): No    CCM Care Plan  Allergies  Allergen Reactions   Penicillins Itching    Has patient had a PCN reaction causing immediate rash, facial/tongue/throat swelling, SOB or lightheadedness with hypotension: no Has patient had a PCN reaction causing severe rash involving mucus membranes or skin necrosis: unkn Has patient had a PCN reaction that required hospitalization: no Has patient had a PCN reaction occurring within the last 10 years: no If all of the above answers are "NO", then may proceed  with Cephalosporin use.  Tolerated Cephalosporin Date: 10/01/20.    Medications Reviewed Today     Reviewed by Agnes Lawrence, CMA (Certified Medical Assistant) on 06/01/21 at 1449  Med List Status: <None>   Medication Order Taking? Sig Documenting Provider Last Dose Status Informant  acetaminophen (TYLENOL) 500 MG tablet [id number] Yes TAKE TWO TABLETS BY MOUTH THREE TIMES A DAY AS NEEDED [provider] Taking Active   Alirocumab (PRALUENT) 150 MG/ML SOAJ [id number] Yes Inject 150 mg/mL into the skin every 14 (fourteen) days. [provider] Taking Active Self  amLODipine (NORVASC) 10 MG tablet [id number] Yes Take 1 tablet (10 mg total) by mouth daily. Leonie Man, MD Taking Active   aspirin EC 81 MG tablet [id number] Yes Take 1 tablet (81 mg total) by mouth daily. Swallow whole. ( start after stopping clopidogrel 75 mg) Leonie Man, MD Taking Active   bimatoprost (LUMIGAN) 0.03 % ophthalmic solution [id number] Yes Place 1 drop into both eyes at bedtime. [provider] Taking Active Self  Brinzolamide-Brimonidine (SIMBRINZA) 1-0.2 % SUSP [id number] Yes Place 1 drop into both eyes in the morning and at bedtime. [provider] Taking Active Self  COVID-19 mRNA Vac-TriS, Pfizer, (PFIZER-BIONT COVID-19 VAC-TRIS) SUSP injection [id number] Yes Inject into the muscle. Carlyle Basques, MD Taking Active   ibuprofen (ADVIL) 200 MG tablet [id number] Yes Take 200 mg by mouth every 6 (six) hours as needed. [provider] Taking Active   isosorbide mononitrate (IMDUR) 30 MG 24 hr tablet [id number] Yes TAKE 1 TABLET BY MOUTH IN  THE Bernerd Pho, MD Taking Active   losartan (COZAAR) 100 MG tablet [id number] Yes Take 100 mg by mouth daily. [provider] Taking Active Self  nitroGLYCERIN (NITROSTAT) 0.4 MG SL tablet [id number] Yes Place 1 tablet (0.4 mg total) under the tongue every 5 (five) minutes as needed for chest pain. Leonie Man, MD Taking Active   traZODone (DESYREL) 50 MG tablet [id number] Yes Take 50 mg by mouth at bedtime. [provider] Taking Active             Patient Active Problem List   Diagnosis Date Noted   Myalgia due to statin 12/10/2020   Primary osteoarthritis of right hip 09/30/2020   OA (  osteoarthritis) of hip 03/04/2020   Pain in left knee 10/30/2019   Fatigue due to treatment 07/18/2019   Coronary artery disease involving native coronary artery of native heart with angina pectoris ([place]) 06/28/2019   Preop cardiovascular exam 05/31/2019   Hypokalemia 02/27/2019   CAD S/P & DES PCI -ostial PDA and D1 02/26/2019   Angina, class III ([place]) 02/14/2019   Cervical spondylosis with myelopathy 07/30/2018   AAA (abdominal aortic aneurysm) without rupture 04/15/2016   Biliary dyskinesia 04/08/2014   Abdominal pain, epigastric 04/08/2014   Pancreatitis, acute 03/12/2014   Hyperglycemia 06/25/2012   ACUTE BRONCHITIS 03/11/2010   OSTEOARTHRITIS, GENERALIZED, MULTIPLE JOINTS 01/06/2010   SINUSITIS, ACUTE 06/11/2009   Hyperlipidemia with target LDL less than 70 - statin intolerant 01/26/2009   Essential hypertension 01/26/2009    Immunization History  Administered Date(s) Administered   Fluad Quad(high Dose 65+) 05/06/2019, 05/06/2020, 05/18/2021   Influenza Split 05/15/2012   Influenza Whole 06/15/2010   Influenza, High Dose Seasonal PF 04/15/2016, 04/18/2017, 05/02/2018   Influenza,inj,Quad PF,6+ Mos 08/02/2013, 05/05/2014   Influenza,inj,quad, With Preservative 05/16/2019   Influenza-Unspecified 07/30/2015   PFIZER Comirnaty(Gray Top)Covid-19 Tri-Sucrose Vaccine 12/10/2020   PFIZER(Purple Top)SARS-COV-2 Vaccination 08/30/2019, 09/20/2019, 06/13/2020   Pneumococcal Conjugate-13 09/27/2013   Pneumococcal Polysaccharide-23 09/15/2008, 04/16/2015   Td 08/16/2003   Tdap 05/05/2014   Zoster Recombinat (Shingrix) 10/14/2018, 10/14/2018, 03/20/2019, 03/20/2019   Zoster, Live  06/17/2011    Conditions to be addressed/monitored:  Hypertension, Hyperlipidemia, Coronary Artery Disease, and Insomnia, Glaucoma and Pre-diabetes  Conditions addressed this visit: Hypertension, hyperlipidemia, Pre-diabetes  Care Plan : [place]  Updates made by Viona Gilmore, [place] since 06/07/2021 12:00 AM     Problem: Problem: Hypertension, Hyperlipidemia, Coronary Artery Disease and Insomnia, Glaucoma      Long-Range Goal: Patient-Specific Goal   Start Date: 12/07/2020  Expected End Date: 12/07/2021  Recent Progress: On track  Priority: High  Note:   Current Barriers:  Unable to independently monitor therapeutic efficacy Unable to achieve control of cholesterol   Pharmacist Clinical Goal(s):  Patient will achieve adherence to monitoring guidelines and medication adherence to achieve therapeutic efficacy achieve control of cholesterol as evidenced by next lipid panel  through collaboration with PharmD and provider.   Interventions: 1:1 collaboration with Eulas Post, MD regarding development and update of comprehensive plan of care as evidenced by provider attestation and co-signature Inter-disciplinary care team collaboration (see longitudinal plan of care) Comprehensive medication review performed; medication list updated in electronic medical record  Hypertension (BP goal <130/80) -Uncontrolled (based on office readings) -Current treatment: Losartan 100 mg 1 tablet daily Amlodipine 10 mg 1 tablet daily -Medications previously tried: HCTZ (kidney function), valsartan (formulary change) -Current home readings: 140s/70s, 123/63, 131/70 (checking every day) -Current dietary habits: patient doesn't use much salt and does not eat frozen or canned foods  -Current exercise habits: using an elliptical for 30 minutes a day but doing some physical therapy -Denies hypotensive/hypertensive symptoms -Educated on Exercise goal of 150 minutes per  week; Importance of home blood pressure monitoring; Proper BP monitoring technique; -Counseled to monitor BP at home weekly, document, and provide log at future appointments -Counseled on diet and exercise extensively Recommended to continue current medication  Hyperlipidemia: (LDL goal < 70) -Controlled -Current treatment: Praluent 150 mg/mL inject every 14 days -Medications previously tried: Repatha (formulary change), statins (muscle pain), Zetia -Current dietary patterns: doesn't eat a lot of sweets -Current exercise habits: hasn't been doing much for the last 2 months except  PT exercises -Educated on Cholesterol goals;  Importance of limiting foods high in cholesterol; Exercise goal of 150 minutes per week; -Counseled on diet and exercise extensively Recommended to continue current medication  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) -Controlled -Current treatment  Isosorbide mononitrate 30 mg 1 tablet daily Clopidogrel 75 mg 1 tablet daily Nitroglycerin 0.4 mg SL 1 tablet as needed -Medications previously tried: none  -Recommended to continue current medication Patient hasn't had chest pain since the day of surgery  Insomnia (Goal: improve quality and quantity of sleep) -Not ideally controlled -Current treatment  Trazodone 50 mg 1 tablet at bedtime every night -Medications previously tried: melatonin (ineffective)  -Counseled on practicing good sleep hygiene by setting a sleep schedule and maintaining it, avoid excessive napping, following a nightly routine, avoiding screen time for 30-60 minutes before going to bed, and making the bedroom a cool, quiet and dark space  Glaucoma (Goal: lower intraocular pressure) -Controlled -Current treatment  Simbrinza 1-0.2% 1 drop in both eyes twice daily Lumigan 0.03% 1 drop in both eyes at bedtime -Medications previously tried: none  -Recommended to continue current medication  Pre-diabetes (A1c goal  <6.5%) -Controlled -Current medications: No medications -Medications previously tried: none  -Current home glucose readings fasting glucose: does not need to check post prandial glucose: does not need to check -Denies hypoglycemic/hyperglycemic symptoms -Current meal patterns: lots of meat; sometimes 1-2 meals a day breakfast: n/a lunch: n/a dinner: sometimes the biggest snacks: n/a drinks: n/a -Current exercise: doing physical therapy -Educated on Carbohydrate counting and/or plate method -Counseled to check feet daily and get yearly eye exams -Counseled on diet and exercise extensively  Health Maintenance -Vaccine gaps: none -Current therapy:  Tylenol 500 mg - taking 2 daily - depends on the need -Educated on Cost vs benefit of each product must be carefully weighed by individual consumer -Patient is satisfied with current therapy and denies issues -Recommended to continue current medication  Patient Goals/Self-Care Activities Patient will:  - check blood pressure weekly, document, and provide at future appointments target a minimum of 150 minutes of moderate intensity exercise weekly  Follow Up Plan: Telephone follow up appointment with care management team member scheduled for: 1 year       Medication Assistance: None required.  Patient affirms current coverage meets needs.  Patient's preferred pharmacy is:  [id number]- [place], [place] - [id number]: [phone]: [phone] Uses pill box? No - spice rack in his cabinet Pt endorses 100% compliance  We discussed: Current pharmacy is preferred with insurance plan and patient is satisfied with pharmacy services Patient decided to: Continue current medication management strategy  Care Plan and Follow Up Patient Decision:  Patient agrees to Care Plan and Follow-up.  Plan: Telephone follow up appointment  with care management team member scheduled for:  1 year  MJeni Salles PharmD BMaeserPharmacist LOccidental Petroleumat [phone]

## 2021-06-14 DIAGNOSIS — E785 Hyperlipidemia, unspecified: Secondary | ICD-10-CM | POA: Diagnosis not present

## 2021-06-14 DIAGNOSIS — I10 Essential (primary) hypertension: Secondary | ICD-10-CM | POA: Diagnosis not present

## 2021-06-16 ENCOUNTER — Ambulatory Visit: Payer: Medicare Other

## 2021-06-23 DIAGNOSIS — Z96641 Presence of right artificial hip joint: Secondary | ICD-10-CM | POA: Diagnosis not present

## 2021-07-02 ENCOUNTER — Other Ambulatory Visit: Payer: Self-pay

## 2021-07-02 DIAGNOSIS — I2510 Atherosclerotic heart disease of native coronary artery without angina pectoris: Secondary | ICD-10-CM

## 2021-07-02 DIAGNOSIS — Z9861 Coronary angioplasty status: Secondary | ICD-10-CM

## 2021-07-02 DIAGNOSIS — E785 Hyperlipidemia, unspecified: Secondary | ICD-10-CM

## 2021-07-02 DIAGNOSIS — I25119 Atherosclerotic heart disease of native coronary artery with unspecified angina pectoris: Secondary | ICD-10-CM | POA: Diagnosis not present

## 2021-07-02 LAB — COMPREHENSIVE METABOLIC PANEL
ALT: 16 IU/L (ref 0–44)
AST: 22 IU/L (ref 0–40)
Albumin/Globulin Ratio: 2.2 (ref 1.2–2.2)
Albumin: 4.4 g/dL (ref 3.7–4.7)
Alkaline Phosphatase: 122 IU/L — ABNORMAL HIGH (ref 44–121)
BUN/Creatinine Ratio: 16 (ref 10–24)
BUN: 15 mg/dL (ref 8–27)
Bilirubin Total: 0.4 mg/dL (ref 0.0–1.2)
CO2: 24 mmol/L (ref 20–29)
Calcium: 9.5 mg/dL (ref 8.6–10.2)
Chloride: 104 mmol/L (ref 96–106)
Creatinine, Ser: 0.95 mg/dL (ref 0.76–1.27)
Globulin, Total: 2 g/dL (ref 1.5–4.5)
Glucose: 118 mg/dL — ABNORMAL HIGH (ref 70–99)
Potassium: 4.1 mmol/L (ref 3.5–5.2)
Sodium: 143 mmol/L (ref 134–144)
Total Protein: 6.4 g/dL (ref 6.0–8.5)
eGFR: 83 mL/min/{1.73_m2} (ref >59)

## 2021-07-02 LAB — LIPID PANEL
Chol/HDL Ratio: 2.7 ratio (ref 0.0–5.0)
Cholesterol, Total: 123 mg/dL (ref 100–199)
HDL: 46 mg/dL (ref >39)
LDL Chol Calc (NIH): 63 mg/dL (ref 0–99)
Triglycerides: 64 mg/dL (ref 0–149)
VLDL Cholesterol Cal: 14 mg/dL (ref 5–40)

## 2021-07-12 ENCOUNTER — Encounter: Payer: Self-pay | Admitting: Cardiology

## 2021-07-12 ENCOUNTER — Ambulatory Visit (INDEPENDENT_AMBULATORY_CARE_PROVIDER_SITE_OTHER): Payer: Medicare Other | Admitting: Cardiology

## 2021-07-12 ENCOUNTER — Other Ambulatory Visit: Payer: Self-pay

## 2021-07-12 VITALS — BP 142/76 | HR 64 | Ht 73.0 in | Wt 194.4 lb

## 2021-07-12 DIAGNOSIS — M791 Myalgia, unspecified site: Secondary | ICD-10-CM | POA: Diagnosis not present

## 2021-07-12 DIAGNOSIS — I2510 Atherosclerotic heart disease of native coronary artery without angina pectoris: Secondary | ICD-10-CM

## 2021-07-12 DIAGNOSIS — I10 Essential (primary) hypertension: Secondary | ICD-10-CM

## 2021-07-12 DIAGNOSIS — R5383 Other fatigue: Secondary | ICD-10-CM | POA: Diagnosis not present

## 2021-07-12 DIAGNOSIS — T466X5D Adverse effect of antihyperlipidemic and antiarteriosclerotic drugs, subsequent encounter: Secondary | ICD-10-CM

## 2021-07-12 DIAGNOSIS — Z9861 Coronary angioplasty status: Secondary | ICD-10-CM

## 2021-07-12 DIAGNOSIS — I7143 Infrarenal abdominal aortic aneurysm, without rupture: Secondary | ICD-10-CM

## 2021-07-12 DIAGNOSIS — I209 Angina pectoris, unspecified: Secondary | ICD-10-CM | POA: Diagnosis not present

## 2021-07-12 DIAGNOSIS — E785 Hyperlipidemia, unspecified: Secondary | ICD-10-CM

## 2021-07-12 DIAGNOSIS — I25119 Atherosclerotic heart disease of native coronary artery with unspecified angina pectoris: Secondary | ICD-10-CM

## 2021-07-12 NOTE — Progress Notes (Signed)
Primary Care Provider: Eulas Post, MD Cardiologist: Glenetta Hew, MD Electrophysiologist: None  Clinic Note: Chief Complaint  Patient presents with   Follow-up    6 months   Coronary Artery Disease    Has taken nitroglycerin maybe once or twice a month for the last couple months.  Not necessarily associated with exertion.    ===================================  ASSESSMENT/PLAN   Problem List Items Addressed This Visit       Cardiology Problems   CAD S/P & DES PCI -ostial PDA and D1 (Chronic)    He is now 2 years out from his follow-up PCI.  No longer on Plavix because of bruising.  He is only on aspirin.  Okay to hold aspirin 5 to 7 days preop for surgeries or procedures.      Relevant Orders   EKG 12-Lead   Coronary artery disease involving native coronary artery of native heart with angina pectoris ([place]) - Primary (Chronic)    He is having intermittent episodes of chest pain which may or may not be related to chewing angina versus spasm.  He is on Imdur which shows that he can consider stopping, but since he is having to take nitroglycerin every now and then we will continue Imdur, if he has more pain episodes he will take an extra dose of Imdur that day.  Not on statin because of myopathy.  No longer on Plavix.  On maintenance dose aspirin.  Plan:  Continue with amlodipine and losartan as well as aspirin and Praluent. Continue Imdur at current dose.  We will try to avoid titrating unless he has more symptoms.      Relevant Orders   EKG 12-Lead   Hyperlipidemia with target LDL less than 70 - statin intolerant (Chronic)    Most recent lipid panel shows well-controlled lipids on Praluent.  LDL 63. Has statin myopathy      Essential hypertension (Chronic)    Blood pressures have finally bounced back to what are probably his baseline.  He tells me at home his pressures in the 130s over 70s.  As such, will not make any further adjustments.  He is on max  dose amlodipine and losartan.  If pressures were to continue to increase, would use diuretic.      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  We can reassess at follow-up visit in 2023.        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  Had myalgias and memory issues as well as fatigue.  Unable to tolerate even low doses.  Now on Praluent.      Fatigue due to treatment (Chronic)    Doing better off of statin and beta-blocker.       ===================================  HPI:    Luis Guzman is a 75 y.o. male with a PMH notable for CAD having PCI to the Diagonal 1 PDA who presents today for delayed 48-month follow-up.  NORMAL RECINOS was last seen on December 10, 2020 Stop Plavix, continue aspirin Praluent, amlodipine and losartan.  No beta-blocker because of fatigue.  Plan was to potentially titrate up ARB if BP will tolerate.  Recent Hospitalizations: None  Reviewed  CV studies:    The following studies were reviewed today: (if available, images/films reviewed: From Epic Chart or Care Everywhere) None:  Interval History:   CHIRON CAMPIONE returns here today  for delayed 33-month follow-up.  He is doing fairly well. Now that he is had both his hips and is able to be much more active.  In doing so he is doing relatively well with no major symptoms.  He says that maybe once or twice a month he will have an episode of chest pain which to take nitroglycerin.  In October he had an episode at rest where he took 3 nitroglycerin to resolve pain.  Otherwise, he is able do what he wants to do without any symptoms.  He denies any heart failure symptoms of PND, orthopnea or edema.  He says once we increase the amlodipine to full 10 mg, he did have some edema for little while but after a month or so that resolved.  He is no longer having any orthostatic dizziness. No sensation of her rapid irregular  heartbeats or palpitations.  CV Review of Symptoms (Summary) Cardiovascular ROS: positive for - chest pain and - These are rare intermittent episodes.  Not necessarily associated with exertion. negative for - dyspnea on exertion, edema, irregular heartbeat, orthopnea, palpitations, paroxysmal nocturnal dyspnea, rapid heart rate, shortness of breath, or Syncope/near syncope or TIA/amaurosis fugax, claudication  REVIEWED OF SYSTEMS   Review of Systems  Constitutional:  Negative for malaise/fatigue (Energy improved, but still feels tired sometimes.) and weight loss (Weight goes up and down.).  HENT:  Negative for nosebleeds.   Respiratory:  Negative for cough and shortness of breath.   Cardiovascular:  Negative for claudication.  Gastrointestinal:  Negative for blood in stool and melena.  Genitourinary:  Negative for dysuria, frequency and hematuria.  Musculoskeletal:  Positive for joint pain (Hips are doing much better.). Negative for myalgias (Nighttime cramps to 3 days a week.).  Neurological:  Negative for dizziness, weakness and headaches.  Endo/Heme/Allergies:  Does not bruise/bleed easily.  Psychiatric/Behavioral:  Negative for depression and memory loss. The patient does not have insomnia.    I have reviewed and (if needed) personally updated the patient's problem list, medications, allergies, past medical and surgical history, social and family history.   PAST MEDICAL HISTORY   Past Medical History:  Diagnosis Date   AAA (abdominal aortic aneurysm)    Anemia    as infant history of   CAD S/P PTCA & DES PCI - for Progressive Angina 02/26/2019   Cath-PCI 02/26/2019: EF 55-65%. mCx 65% (FFR 0.82 - Med Rx). D2 85% - Wolverine Scoring Balloon PTCA (2.0 mm). Ost rPDA - 90% -Resolute Onyx 2.5 x 15 (2.6 mm). 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 lower back pain, s/p cervical neck surgery"stiffness" remains   Transfusion history    infant "anemia"    PAST SURGICAL HISTORY   Past Surgical History:  Procedure Laterality Date   APPENDECTOMY  ~ [place] N/A 07/30/2018   Procedure: Cervical five-six Cervical six-seven Artificial disc replacement;  Surgeon: Kristeen Miss, MD;  Location: [place];  Service: Neurosurgery;  Laterality:  N/A;   CERVICAL LAMINECTOMY  1997   C 4 and C 5   CHOLECYSTECTOMY N/A 04/15/2014   Procedure: LAPAROSCOPIC CHOLECYSTECTOMY WITH INTRAOPERATIVE CHOLANGIOGRAM;  Surgeon: Armandina Gemma, MD;  Location: [facility];  Service: General;  Laterality: N/A;   COLONOSCOPY     CORONARY BALLOON ANGIOPLASTY N/A 02/26/2019   Procedure: CORONARY BALLOON ANGIOPLASTY;  Surgeon: Leonie Man, MD;  Location: [place] [facility];  Service: Cardiovascular -> scoring balloon PTCA (Wolverine 2.0 mm) of ostial D2 85% reducing to 20-30%.   CORONARY STENT INTERVENTION N/A 02/26/2019   Procedure: CORONARY STENT INTERVENTION;  Surgeon: Leonie Man, MD;  Location: [place] [facility];; PCI ostial RPDA (initial attempt of PTCA only led to small local tear/dissection covered with stent) Resolute Onyx 2.5 mm x 15 mm (2.6 mm   CORONARY STENT INTERVENTION N/A 07/05/2019   Procedure: CORONARY STENT INTERVENTION;  Surgeon: Leonie Man, MD;  Location: [place] [facility];  Service: Cardiovascular;; ostial D1 95% (restenosis of PTCA site) -> DES PCI RESOLUTE ONYX 2.25 mm x 50 mm (2.6 mm) positioned to avoid the true ostium.    ESOPHAGOGASTRODUODENOSCOPY N/A 03/13/2014   Procedure: ESOPHAGOGASTRODUODENOSCOPY (EGD);  Surgeon: Wonda Horner, MD;  Location: [facility];  Service: Endoscopy;  Laterality: N/A;   INTRAVASCULAR PRESSURE WIRE/FFR STUDY N/A 02/26/2019   Procedure: INTRAVASCULAR PRESSURE WIRE/FFR STUDY;  Surgeon: Leonie Man, MD;  Location: [place] [facility];  Service: Cardiovascular;; mCx ~65% - DFR 0.92, FR 0.82 - BORDERLINE--> MED Rx   LEFT HEART CATH AND CORONARY ANGIOGRAPHY N/A 02/26/2019   Procedure: LEFT HEART CATH AND CORONARY ANGIOGRAPHY;  Surgeon: Leonie Man, MD;  Location: [place] [facility];  EF 55-65%. mCx 65% (FFR 0.82 - Med Rx). D2 85% - Wolverine Scoring Balloon PTCA (2.0 mm). Ost rPDA - 90% -Resolute Onyx 2.5 x 15 (2.6 mm).    LEFT HEART CATH AND CORONARY ANGIOGRAPHY N/A 07/05/2019   Procedure: LEFT HEART CATH AND CORONARY ANGIOGRAPHY;  Surgeon: Leonie Man, MD;  Location: [facility];; ostial D1 95% (restenosis of PTCA site) -> DES PCI.  PDA stent widely patent.  Mid CX stable 65% lesion.  Proximal RCA 40%.  Mid RCA 45%.  Normal EDP.   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   SHOULDER ARTHROSCOPY Bilateral    SHOULDER ARTHROSCOPY W/ ROTATOR CUFF REPAIR Bilateral 2004-2009   left 2004, right 2009   TOTAL HIP ARTHROPLASTY Left 03/04/2020   Procedure: TOTAL HIP ARTHROPLASTY ANTERIOR APPROACH;  Surgeon: Gaynelle Arabian, MD;  Location: [facility];  Service: Orthopedics;  Laterality: Left;  177min   TOTAL HIP ARTHROPLASTY Right 09/30/2020   Procedure: TOTAL HIP ARTHROPLASTY ANTERIOR APPROACH;  Surgeon: Gaynelle Arabian, MD;  Location: [facility];  Service: Orthopedics;  Laterality: Right;  147min   TRANSTHORACIC ECHOCARDIOGRAM  02/2020; 10/01/2020   a) EF 55 to 60%.  GR 1 DD.  No R WMA.  Trivial MR.  Otherwise normal.; b) EF 55 to 60%.  Normal LV function and no wall motion abnormality.  Normal RV.  Normal valves.[name]   VASECTOMY     WISDOM TOOTH EXTRACTION       Cath- PCI 07/05/2019: ostial D1 95% (restenosis of PTCA site) -> DES PCI RESOLUTE ONYX 2.25 mm x 50 mm (2.6 mm)  positioned to avoid the true ostium.  PDA stent widely patent.  Mid CX stable 65% lesion.  Proximal RCA 40%.  Mid RCA 45%. Normal EDP.   Immunization  History  Administered Date(s) Administered   Fluad Quad(high Dose 65+) 05/06/2019, 05/06/2020, 05/18/2021   Influenza Split 05/15/2012   Influenza Whole 06/15/2010   Influenza, High Dose Seasonal PF 04/15/2016, 04/18/2017, 05/02/2018   Influenza,inj,Quad PF,6+ Mos 08/02/2013, 05/05/2014   Influenza,inj,quad, With Preservative 05/16/2019   Influenza-Unspecified 07/30/2015   PFIZER Comirnaty(Gray Top)Covid-19 Tri-Sucrose Vaccine 12/10/2020   PFIZER(Purple Top)SARS-COV-2 Vaccination 08/30/2019, 09/20/2019, 06/13/2020   Pneumococcal Conjugate-13 09/27/2013   Pneumococcal Polysaccharide-23 09/15/2008, 04/16/2015   Td 08/16/2003   Tdap 05/05/2014   Zoster Recombinat (Shingrix) 10/14/2018, 10/14/2018, 03/20/2019, 03/20/2019   Zoster, Live 06/17/2011    MEDICATIONS/ALLERGIES   Current Meds  Medication Sig   acetaminophen (TYLENOL) 500 MG tablet TAKE TWO TABLETS BY MOUTH THREE TIMES A DAY AS NEEDED   Alirocumab (PRALUENT) 150 MG/ML SOAJ Inject 150 mg/mL into the skin every 14 (fourteen) days.   aspirin EC 81 MG tablet Take 1 tablet (81 mg total) by mouth daily. Swallow whole. ( start after stopping clopidogrel 75 mg)   bimatoprost (LUMIGAN) 0.03 % ophthalmic solution Place 1 drop into both eyes at bedtime.   Brinzolamide-Brimonidine (SIMBRINZA) 1-0.2 % SUSP Place 1 drop into both eyes in the morning and at bedtime.   COVID-19 mRNA Vac-TriS, Pfizer, (PFIZER-BIONT COVID-19 VAC-TRIS) SUSP injection Inject into the muscle.   ibuprofen (ADVIL) 200 MG tablet Take 200 mg by mouth every 6 (six) hours as needed.   isosorbide mononitrate (IMDUR) 30 MG 24 hr tablet TAKE 1 TABLET BY MOUTH IN  THE EVENING   losartan (COZAAR) 100 MG tablet Take 100 mg by mouth daily.   nitroGLYCERIN (NITROSTAT) 0.4 MG SL tablet Place 1 tablet (0.4 mg total) under the tongue  every 5 (five) minutes as needed for chest pain.   traZODone (DESYREL) 50 MG tablet Take 50 mg by mouth at bedtime.   [DISCONTINUED] benzonatate (TESSALON PERLES) 100 MG capsule Take 1 capsule (100 mg total) by mouth 3 (three) times daily as needed.    Allergies  Allergen Reactions   Penicillins Itching    Has patient had a PCN reaction causing immediate rash, facial/tongue/throat swelling, SOB or lightheadedness with hypotension: no Has patient had a PCN reaction causing severe rash involving mucus membranes or skin necrosis: unkn Has patient had a PCN reaction that required hospitalization: no Has patient had a PCN reaction occurring within the last 10 years: no If all of the above answers are "NO", then may proceed with Cephalosporin use.  Tolerated Cephalosporin Date: 10/01/20.    SOCIAL HISTORY/FAMILY HISTORY   Reviewed in Epic:  Pertinent findings:  Social History   Tobacco Use   Smoking status: Former    Packs/day: 1.00    Years: 5.00    Pack years: 5.00    Types: Cigarettes    Quit date: 10/17/1968    Years since quitting: 52.7   Smokeless tobacco: Never  Vaping Use   Vaping Use: Never used  Substance Use Topics   Alcohol use: Not Currently    Comment: "quit drinking ~ 10/1968   Drug use: No   Social History   Social History Narrative   Not on file    OBJCTIVE -PE, EKG, labs   Wt Readings from Last 3 Encounters:  07/12/21 194 lb 6.4 oz (88.2 kg)  06/01/21 180 lb (81.6 kg)  05/18/21 188 lb (85.3 kg)    Physical Exam: BP (!) 142/76   Pulse 64   Ht 6\' 1"  (1.854 m)   Wt 194 lb 6.4 oz (88.2 kg)  SpO2 98%   BMI 25.65 kg/m  - home BP range 130s/70s Physical Exam Vitals reviewed.  Constitutional:      General: He is not in acute distress.    Appearance: Normal appearance. He is normal weight. He is not toxic-appearing.  HENT:     Head: Normocephalic and atraumatic.  Neck:     Vascular: No carotid bruit.  Cardiovascular:     Rate and Rhythm: Normal  rate and regular rhythm.     Pulses: Normal pulses.     Heart sounds: Normal heart sounds. No murmur heard.   No friction rub. No gallop.  Pulmonary:     Effort: Pulmonary effort is normal. No respiratory distress.     Breath sounds: Normal breath sounds.  Musculoskeletal:        General: No swelling. Normal range of motion.     Cervical back: Normal range of motion and neck supple.  Skin:    General: Skin is warm and dry.  Neurological:     General: No focal deficit present.     Mental Status: He is alert and oriented to person, place, and time. Mental status is at baseline.     Motor: No weakness.     Gait: Gait normal.  Psychiatric:        Mood and Affect: Mood normal.        Behavior: Behavior normal.        Thought Content: Thought content normal.        Judgment: Judgment normal.    Adult ECG Report  Rate: 64 ;  Rhythm: normal sinus rhythm; normal axis, intervals and durations.  Narrative Interpretation: Stable, normal  Recent Labs:  reviewed  Lab Results  Component Value Date   CHOL 123 07/02/2021   HDL 46 07/02/2021   LDLCALC 63 07/02/2021   LDLDIRECT 129.0 08/19/2016   TRIG 64 07/02/2021   CHOLHDL 2.7 07/02/2021   Lab Results  Component Value Date   CREATININE 0.95 07/02/2021   BUN 15 07/02/2021   NA 143 07/02/2021   K 4.1 07/02/2021   CL 104 07/02/2021   CO2 24 07/02/2021   CBC Latest Ref Rng & Units 10/01/2020 09/30/2020 09/07/2020  WBC 4.0 - 10.5 K/uL 20.9(H) 8.9 8.3  Hemoglobin 13.0 - 17.0 g/dL 12.7(L) 16.5 15.8  Hematocrit 39.0 - 52.0 % 36.6(L) 47.7 46.8  Platelets 150 - 400 K/uL 206 206 221    Lab Results  Component Value Date   HGBA1C 6.0 05/18/2021   Lab Results  Component Value Date   TSH 0.61 05/06/2019    ==================================================  COVID-19 Education: The signs and symptoms of COVID-19 were discussed with the patient and how to seek care for testing (follow up with PCP or arrange E-visit).    I spent a total  of 21min with the patient spent in direct patient consultation.  Additional time spent with chart review  / charting (studies, outside notes, etc): 14 min Total Time: 36 min  Current medicines are reviewed at length with the patient today.  (+/- concerns) n/a  This visit occurred during the SARS-CoV-2 public health emergency.  Safety protocols were in place, including screening questions prior to the visit, additional usage of staff PPE, and extensive cleaning of exam room while observing appropriate contact time as indicated for disinfecting solutions.  Notice: This dictation was prepared with Dragon dictation along with smart phrase technology. Any transcriptional errors that result from this process are unintentional and may not be corrected upon review.  Studies Ordered:   Orders Placed This Encounter  Procedures   EKG 12-Lead     Patient Instructions / Medication Changes & Studies & Tests Ordered   Patient Instructions  Medication Instructions:  No changes  *If you need a refill on your cardiac medications before your next appointment, please call your pharmacy*   Lab Work: Not needed    Testing/Procedures:  Not needed  Follow-Up: At [facility], you and your health needs are our priority.  As part of our continuing mission to provide you with exceptional heart care, we have created designated Provider Care Teams.  These Care Teams include your primary Cardiologist (physician) and Advanced Practice Providers (APPs -  Physician Assistants and Nurse Practitioners) who all work together to provide you with the care you need, when you need it.     Your next appointment:   12 month(s)  The format for your next appointment:   In Person  Provider:   Glenetta Hew, MD        Glenetta Hew, M.D., M.S. Interventional Cardiologist   Pager # [phone] Phone # [phone] [address] [place], [place] [address]   Thank you for choosing  Heartcare at [facility]!!

## 2021-07-12 NOTE — Patient Instructions (Signed)

## 2021-07-12 NOTE — Assessment & Plan Note (Signed)
Blood pressures have finally bounced back to what are probably his baseline.  He tells me at home his pressures in the 130s over 70s.  As such, will not make any further adjustments.  He is on max dose amlodipine and losartan.  If pressures were to continue to increase, would use diuretic.

## 2021-07-12 NOTE — Assessment & Plan Note (Signed)
Doing better off of statin and beta-blocker.

## 2021-07-12 NOTE — Assessment & Plan Note (Signed)
Relatively small 5 years ago.  We can reassess at follow-up visit in 2023.

## 2021-07-12 NOTE — Assessment & Plan Note (Signed)
Had tried multiple different versions of statin.  Had myalgias and memory issues as well as fatigue.  Unable to tolerate even low doses.  Now on Praluent.

## 2021-07-12 NOTE — Assessment & Plan Note (Signed)
Most recent lipid panel shows well-controlled lipids on Praluent.  LDL 63. Has statin myopathy

## 2021-07-12 NOTE — Assessment & Plan Note (Signed)
He is now 2 years out from his follow-up PCI.  No longer on Plavix because of bruising.  He is only on aspirin.  Okay to hold aspirin 5 to 7 days preop for surgeries or procedures.

## 2021-07-12 NOTE — Assessment & Plan Note (Signed)
He is having intermittent episodes of chest pain which may or may not be related to chewing angina versus spasm.  He is on Imdur which shows that he can consider stopping, but since he is having to take nitroglycerin every now and then we will continue Imdur, if he has more pain episodes he will take an extra dose of Imdur that day.  Not on statin because of myopathy.  No longer on Plavix.  On maintenance dose aspirin.  Plan:   Continue with amlodipine and losartan as well as aspirin and Praluent.  Continue Imdur at current dose.  We will try to avoid titrating unless he has more symptoms.

## 2021-07-28 ENCOUNTER — Other Ambulatory Visit: Payer: Self-pay

## 2021-07-28 MED ORDER — REPATHA SURECLICK 140 MG/ML ~~LOC~~ SOAJ: 140.0000 mg | SUBCUTANEOUS | 11 refills | Status: DC

## 2021-08-12 DIAGNOSIS — R197 Diarrhea, unspecified: Secondary | ICD-10-CM | POA: Diagnosis not present

## 2021-08-12 DIAGNOSIS — K591 Functional diarrhea: Secondary | ICD-10-CM | POA: Diagnosis not present

## 2021-08-12 DIAGNOSIS — R110 Nausea: Secondary | ICD-10-CM | POA: Diagnosis not present

## 2021-08-12 DIAGNOSIS — R1013 Epigastric pain: Secondary | ICD-10-CM | POA: Diagnosis not present

## 2021-08-12 DIAGNOSIS — K589 Irritable bowel syndrome without diarrhea: Secondary | ICD-10-CM | POA: Diagnosis not present

## 2021-09-01 ENCOUNTER — Other Ambulatory Visit (HOSPITAL_BASED_OUTPATIENT_CLINIC_OR_DEPARTMENT_OTHER): Payer: Self-pay

## 2021-09-01 ENCOUNTER — Ambulatory Visit: Payer: Medicare Other | Attending: Internal Medicine

## 2021-09-01 DIAGNOSIS — Z23 Encounter for immunization: Secondary | ICD-10-CM

## 2021-09-01 MED ORDER — PFIZER COVID-19 VAC BIVALENT 30 MCG/0.3ML IM SUSP
INTRAMUSCULAR | 0 refills | Status: DC
  Filled 2021-09-01: qty 0.3, 1d supply, fill #0

## 2021-09-01 NOTE — Progress Notes (Signed)
° °  Covid-19 Vaccination Clinic  Name:  Luis Guzman    MRN: [id number] DOB: 08-19-1945  09/01/2021  Mr. Sibley was observed post Covid-19 immunization for 15 minutes without incident. He was provided with Vaccine Information Sheet and instruction to access the V-Safe system.   Mr. Kaeding was instructed to call 911 with any severe reactions post vaccine: Difficulty breathing  Swelling of face and throat  A fast heartbeat  A bad rash all over body  Dizziness and weakness   Immunizations Administered     Name Date Dose VIS Date Route   Pfizer Covid-19 Vaccine Bivalent Booster 09/01/2021  1:19 PM 0.3 mL 04/14/2021 Intramuscular   Manufacturer: [place]   Lot: [id number]   [place]: [phone]

## 2021-09-27 ENCOUNTER — Telehealth: Payer: Self-pay | Admitting: Pharmacist

## 2021-09-27 NOTE — Chronic Care Management (AMB) (Signed)
Chronic Care Management Pharmacy Assistant   Name: Luis Guzman  MRN: [id number] DOB: 1946-06-06  Reason for Encounter: Disease State / Hypertension Assessment Call   Conditions to be addressed/monitored: HTN   Recent office visits:  None  Recent consult visits:  07/12/2021 Glenetta Hew MD(cardiology) - Patient was seen for Coronary artery disease involving native coronary artery of native heart with angina pectoris and additional issues. Discontinued Benzonatate.  Follow up in 12 months.  Hospital visits:  None  Medications: Outpatient Encounter Medications as of 09/27/2021  Medication Sig   acetaminophen (TYLENOL) 500 MG tablet TAKE TWO TABLETS BY MOUTH THREE TIMES A DAY AS NEEDED   amLODipine (NORVASC) 10 MG tablet Take 1 tablet (10 mg total) by mouth daily.   aspirin EC 81 MG tablet Take 1 tablet (81 mg total) by mouth daily. Swallow whole. ( start after stopping clopidogrel 75 mg)   bimatoprost (LUMIGAN) 0.03 % ophthalmic solution Place 1 drop into both eyes at bedtime.   Brinzolamide-Brimonidine (SIMBRINZA) 1-0.2 % SUSP Place 1 drop into both eyes in the morning and at bedtime.   COVID-19 mRNA bivalent vaccine, Pfizer, (PFIZER COVID-19 VAC BIVALENT) injection Inject into the muscle.   COVID-19 mRNA Vac-TriS, Pfizer, (PFIZER-BIONT COVID-19 VAC-TRIS) SUSP injection Inject into the muscle.   Evolocumab (REPATHA SURECLICK) 329 MG/ML SOAJ Inject 140 mg into the skin every 14 (fourteen) days.   ibuprofen (ADVIL) 200 MG tablet Take 200 mg by mouth every 6 (six) hours as needed.   isosorbide mononitrate (IMDUR) 30 MG 24 hr tablet TAKE 1 TABLET BY MOUTH IN  THE EVENING   losartan (COZAAR) 100 MG tablet Take 100 mg by mouth daily.   nitroGLYCERIN (NITROSTAT) 0.4 MG SL tablet Place 1 tablet (0.4 mg total) under the tongue every 5 (five) minutes as needed for chest pain.   traZODone (DESYREL) 50 MG tablet Take 50 mg by mouth at bedtime.   No facility-administered encounter  medications on file as of 09/27/2021.  Fill History:  HYOSCYAMINE 0.125 MG TAB SL 08/12/2021 30 ISOSORBIDE MONONITRATE ER  30 MG TB24 06/10/2021 90   [facility] OMEPRAZOLE-BICARB 20-1,100 08/12/2021   ONDANSETRON HCL 4 MG TABLET 08/12/2021 30   AMLODIPINE BESYLATE  10 MG TABS 04/02/2021 90   Reviewed chart prior to disease state call. Spoke with patient regarding BP  Recent Office Vitals: BP Readings from Last 3 Encounters:  07/12/21 (!) 142/76  05/18/21 130/62  12/10/20 (!) 144/82   Pulse Readings from Last 3 Encounters:  07/12/21 64  05/18/21 70  12/10/20 70    Wt Readings from Last 3 Encounters:  07/12/21 194 lb 6.4 oz (88.2 kg)  06/01/21 180 lb (81.6 kg)  05/18/21 188 lb (85.3 kg)     Kidney Function Lab Results  Component Value Date/Time   CREATININE 0.95 07/02/2021 09:17 AM   CREATININE 0.95 05/18/2021 08:40 AM   CREATININE 0.94 05/06/2020 08:42 AM   CREATININE 0.80 07/12/2013 03:53 PM   GFR 78.50 05/18/2021 08:40 AM   GFRNONAA >60 10/01/2020 05:01 AM   GFRAA 77 09/02/2020 04:05 PM    BMP Latest Ref Rng & Units 07/02/2021 05/18/2021 12/11/2020  Glucose 70 - 99 mg/dL 118(H) 110(H) 101(H)  BUN 8 - 27 mg/dL 15 15 10   Creatinine 0.76 - 1.27 mg/dL 0.95 0.95 0.85  BUN/Creat Ratio 10 - 24 16 - 12  Sodium 134 - 144 mmol/L 143 143 144  Potassium 3.5 - 5.2 mmol/L 4.1 3.5 3.5  Chloride 96 - 106 mmol/L  104 108 109(H)  CO2 20 - 29 mmol/L 24 28 22   Calcium 8.6 - 10.2 mg/dL 9.5 9.1 8.9    Current antihypertensive regimen:  Amlodipine 10 mg 1 tablet daily Losartan 100 mg 1 tablet daily  How often are you checking your Blood Pressure?  Patient states he is checking blood pressures occasionally.   Current home BP readings: Patient states his blood pressures are generally around 135/75.   What recent interventions/DTPs have been made by any provider to improve Blood Pressure control since last CPP Visit: No recent interventions  Any recent hospitalizations or ED visits since  last visit with CPP? No recent hospital visits.   What diet changes have been made to improve Blood Pressure Control?  Patient is planning to try a mediterranean diet to see if this will help with his GI issues.  Breakfast - Patient doesn't generally eat breakfast Lunch - Patient will snack light if he is [place] Dinner - Patient will eat a variety with a meat and vegetable.   What exercise is being done to improve your Blood Pressure Control?  Patient states he walks and does yard work   Adherence Review: Is the patient currently on ACE/ARB medication? Yes Does the patient have >5 day gap between last estimated fill dates? No  Care Gaps: AWV - scheduled 05/10/2022 Last BP - 142/76 on 07/12/2021 Foot Exam - over due  Star Rating Drugs: Losartan 100 mg - filled at [facility] Pharmacist Assistant [phone]

## 2021-10-07 ENCOUNTER — Telehealth: Payer: Self-pay | Admitting: Cardiology

## 2021-10-07 NOTE — Telephone Encounter (Signed)
Patient reports swelling in both legs down to ankles and feet for the past 2 weeks. He has gained 5-6 pounds over the past week. He has sob with putting on shoes or walking through the house. BP then was 100/66. When getting up from dental chair this morning, he became light-headed and wobbly. He was winded over the phone. He reports a "fullness in my chest" but denies back/jaw/arm/abdominal pain. This morning's BP was 144/77, P 77. Due to the "fullness in his chest", I had him take an NTG sublingual, after 5 minutes he took another NTG and began to feel relief. I had him rate the chest fullness from 0-10. Before the first ntg (10); after the second ntg (4) and he could catch his breath better with less chest fullness. After another 5 minutes, he took another NTG. He stated he felt better and rated it "way less than a 4". I advised patient that if the chest fullness returns, he should go to the ED to be evaluated. He voiced understanding. He wants an appointment with Dr. Ellyn Hack. Please advise.

## 2021-10-07 NOTE — Telephone Encounter (Signed)
Called patient to check on him. He stated, "I'm feeling fine." Scheduled him to see DOD Dr. Stanford Breed on 2/27.

## 2021-10-07 NOTE — Telephone Encounter (Signed)
Pt c/o swelling: STAT is pt has developed SOB within 24 hours  If swelling, where is the swelling located? Both feet and ankles  How much weight have you gained and in what time span? 5lbs a week  Have you gained 3 pounds in a day or 5 pounds in a week? 5lbs in a week  Do you have a log of your daily weights (if so, list)? no  Are you currently taking a fluid pill? no  Are you currently SOB? SOB for a week  Have you traveled recently? No

## 2021-10-11 ENCOUNTER — Other Ambulatory Visit: Payer: Self-pay

## 2021-10-11 ENCOUNTER — Other Ambulatory Visit: Payer: Self-pay | Admitting: *Deleted

## 2021-10-11 ENCOUNTER — Encounter: Payer: Self-pay | Admitting: Cardiology

## 2021-10-11 ENCOUNTER — Ambulatory Visit: Payer: Medicare Other | Admitting: Cardiology

## 2021-10-11 VITALS — BP 130/82 | HR 75 | Ht 72.0 in | Wt 195.0 lb

## 2021-10-11 DIAGNOSIS — R072 Precordial pain: Secondary | ICD-10-CM

## 2021-10-11 DIAGNOSIS — R0609 Other forms of dyspnea: Secondary | ICD-10-CM

## 2021-10-11 DIAGNOSIS — I25119 Atherosclerotic heart disease of native coronary artery with unspecified angina pectoris: Secondary | ICD-10-CM | POA: Diagnosis not present

## 2021-10-11 DIAGNOSIS — I10 Essential (primary) hypertension: Secondary | ICD-10-CM

## 2021-10-11 DIAGNOSIS — E785 Hyperlipidemia, unspecified: Secondary | ICD-10-CM

## 2021-10-11 MED ORDER — FUROSEMIDE 20 MG PO TABS: ORAL_TABLET | ORAL | 3 refills | Status: DC

## 2021-10-11 MED ORDER — AMLODIPINE BESYLATE 5 MG PO TABS
5.0000 mg | ORAL_TABLET | Freq: Every day | ORAL | 3 refills | Status: DC
Start: 2021-10-11 — End: 2022-11-28

## 2021-10-11 NOTE — Patient Instructions (Signed)
Medication Instructions:   DECREASE AMLODIPINE TO 5 MG ONCE DAILY= 1/2 OF THE 10 MG TABLET ONCE DAILY  START FUROSEMIDE 20 MG ONCE DAILY FOR 2 DAYS AND THEN TAKE ONCE DAILY AS NEEDED FOR SWELLING OR WEIGHT GAIN  *If you need a refill on your cardiac medications before your next appointment, please call your pharmacy*   Lab Work:  Your physician recommends that you return for lab work in: [place]- DO NOT NEED TO FAST    If you have labs (blood work) drawn today and your tests are completely normal, you will receive your results only by: [place] (if you have MyChart) OR A paper copy in the mail If you have any lab test that is abnormal or we need to change your treatment, we will call you to review the results.   Testing/Procedures:  Your physician has requested that you have an echocardiogram. Echocardiography is a painless test that uses sound waves to create images of your heart. It provides your doctor with information about the size and shape of your heart and how well your hearts chambers and valves are working. This procedure takes approximately one hour. There are no restrictions for this procedure. [place] has requested that you have en exercise stress myoview. For further information please visit [email or web address]. Please follow instruction sheet, as given. [place]   Follow-Up: At [facility], you and your health needs are our priority.  As part of our continuing mission to provide you with exceptional heart care, we have created designated Provider Care Teams.  These Care Teams include your primary Cardiologist (physician) and Advanced Practice Providers (APPs -  Physician Assistants and Nurse Practitioners) who all work together to provide you with the care you need, when you need it.  We recommend signing up for the patient portal called "MyChart".  Sign up information is provided on this After Visit Summary.   MyChart is used to connect with patients for Virtual Visits (Telemedicine).  Patients are able to view lab/test results, encounter notes, upcoming appointments, etc.  Non-urgent messages can be sent to your provider as well.   To learn more about what you can do with MyChart, go to [email or web address].    Your next appointment:    WITH DR HARDING AFTER TESTING COMPLETE.

## 2021-10-11 NOTE — Progress Notes (Signed)
HPI: FU CAD.  Patient of Dr. Allison Quarry.  Cardiac catheterization July 2020 showed normal LV function, 65% circumflex with negative FFR, 85% first diagonal and 90% PDA.  Patient had angioplasty of first diagonal and drug-eluting stent to PDA.  Had repeat scoring balloon angioplasty of first diagonal November 2020.  Last echocardiogram February 2022 showed normal LV function, mild left ventricular hypertrophy and mild aortic insufficiency.  Patient contacted the office February 23 with complaints of chest tightness, increased lower extremity edema, weight gain and dyspnea.  Added to my schedule today.  Over the past 2 weeks he has noticed increased dyspnea on exertion.  There is no orthopnea or PND but he does note lower extremity edema worse towards the end of the day.  He also has had chest tightness.  It has been persistent for 2 weeks without complete resolution.  He does have times when it feels increased and he has taken nitroglycerin.  He has not had syncope.  He has gained 5 to 6 pounds.  Current Outpatient Medications  Medication Sig Dispense Refill   acetaminophen (TYLENOL) 500 MG tablet TAKE TWO TABLETS BY MOUTH THREE TIMES A DAY AS NEEDED     Alirocumab (PRALUENT) 150 MG/ML SOAJ Inject 150 mg into the skin every 14 (fourteen) days.     aspirin EC 81 MG tablet Take 1 tablet (81 mg total) by mouth daily. Swallow whole. ( start after stopping clopidogrel 75 mg) 90 tablet 3   bimatoprost (LUMIGAN) 0.03 % ophthalmic solution Place 1 drop into both eyes at bedtime.     Brinzolamide-Brimonidine (SIMBRINZA) 1-0.2 % SUSP Place 1 drop into both eyes in the morning and at bedtime.     COVID-19 mRNA bivalent vaccine, Pfizer, (PFIZER COVID-19 VAC BIVALENT) injection Inject into the muscle. 0.3 mL 0   COVID-19 mRNA Vac-TriS, Pfizer, (PFIZER-BIONT COVID-19 VAC-TRIS) SUSP injection Inject into the muscle. 0.3 mL 0   ibuprofen (ADVIL) 200 MG tablet Take 200 mg by mouth every 6 (six) hours as needed.      isosorbide mononitrate (IMDUR) 30 MG 24 hr tablet TAKE 1 TABLET BY MOUTH IN  THE EVENING 90 tablet 3   losartan (COZAAR) 100 MG tablet Take 100 mg by mouth daily.     nitroGLYCERIN (NITROSTAT) 0.4 MG SL tablet Place 1 tablet (0.4 mg total) under the tongue every 5 (five) minutes as needed for chest pain. 25 tablet 3   traZODone (DESYREL) 50 MG tablet Take 50 mg by mouth at bedtime.     amLODipine (NORVASC) 10 MG tablet Take 1 tablet (10 mg total) by mouth daily. 90 tablet 1   Evolocumab (REPATHA SURECLICK) 960 MG/ML SOAJ Inject 140 mg into the skin every 14 (fourteen) days. (Patient not taking: Reported on 10/11/2021) 2 mL 11   No current facility-administered medications for this visit.     Past Medical History:  Diagnosis Date   AAA (abdominal aortic aneurysm)    Anemia    as infant history of   CAD S/P PTCA & DES PCI - for Progressive Angina 02/26/2019   Cath-PCI 02/26/2019: EF 55-65%. mCx 65% (FFR 0.82 - Med Rx). D2 85% - Wolverine Scoring Balloon PTCA (2.0 mm). Ost rPDA - 90% -Resolute Onyx 2.5 x 15 (2.6 mm). 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 lower back pain, s/p cervical neck surgery"stiffness" remains   Transfusion history    infant "anemia"    Past Surgical History:  Procedure Laterality Date   APPENDECTOMY  ~ [place] 07/30/2018   Procedure: Cervical five-six Cervical six-seven Artificial disc replacement;  Surgeon: Kristeen Miss, MD;  Location: [facility];  Service: Neurosurgery;  Laterality: N/A;   CERVICAL  LAMINECTOMY  1997   C 4 and C 5   CHOLECYSTECTOMY N/A 04/15/2014   Procedure: LAPAROSCOPIC CHOLECYSTECTOMY WITH INTRAOPERATIVE CHOLANGIOGRAM;  Surgeon: Armandina Gemma, MD;  Location: [facility];  Service: General;  Laterality: N/A;   COLONOSCOPY     CORONARY BALLOON ANGIOPLASTY N/A 02/26/2019   Procedure: CORONARY BALLOON ANGIOPLASTY;  Surgeon: Leonie Man, MD;  Location: [facility];  Service: Cardiovascular -> scoring balloon PTCA (Wolverine 2.0 mm) of ostial D2 85% reducing to 20-30%.   CORONARY STENT INTERVENTION N/A 02/26/2019   Procedure: CORONARY STENT INTERVENTION;  Surgeon: Leonie Man, MD;  Location: [facility];; PCI ostial RPDA (initial attempt of PTCA only led to small local tear/dissection covered with stent) Resolute Onyx 2.5 mm x 15 mm (2.6 mm   CORONARY STENT INTERVENTION N/A 07/05/2019   Procedure: CORONARY STENT INTERVENTION;  Surgeon: Leonie Man, MD;  Location: [facility];  Service: Cardiovascular;; ostial D1 95% (restenosis of PTCA site) -> DES PCI RESOLUTE ONYX 2.25 mm x 50 mm (2.6 mm) positioned to avoid the true ostium.    ESOPHAGOGASTRODUODENOSCOPY N/A 03/13/2014   Procedure: ESOPHAGOGASTRODUODENOSCOPY (EGD);  Surgeon: Wonda Horner, MD;  Location: [facility];  Service: Endoscopy;  Laterality: N/A;   INTRAVASCULAR PRESSURE WIRE/FFR STUDY N/A 02/26/2019   Procedure: INTRAVASCULAR PRESSURE WIRE/FFR STUDY;  Surgeon: Leonie Man, MD;  Location: [facility];  Service: Cardiovascular;; mCx ~65% - DFR 0.92, FR 0.82 - BORDERLINE--> MED Rx   LEFT HEART CATH AND CORONARY ANGIOGRAPHY N/A 02/26/2019   Procedure: LEFT HEART CATH AND CORONARY ANGIOGRAPHY;  Surgeon: Leonie Man, MD;  Location: [facility];  EF 55-65%. mCx 65% (FFR 0.82 - Med Rx). D2 85% - Wolverine Scoring Balloon PTCA (2.0 mm). Ost rPDA - 90% -Resolute Onyx 2.5 x 15 (2.6 mm).    LEFT HEART CATH AND CORONARY ANGIOGRAPHY N/A 07/05/2019   Procedure: LEFT HEART CATH AND  CORONARY ANGIOGRAPHY;  Surgeon: Leonie Man, MD;  Location: [facility];; ostial D1 95% (restenosis of PTCA site) -> DES PCI.  PDA stent widely patent.  Mid CX stable 65% lesion.  Proximal RCA 40%.  Mid RCA 45%.  Normal EDP.   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   SHOULDER ARTHROSCOPY Bilateral    SHOULDER ARTHROSCOPY W/ ROTATOR CUFF REPAIR Bilateral 2004-2009   left 2004, right 2009   TOTAL HIP ARTHROPLASTY Left 03/04/2020   Procedure: TOTAL HIP ARTHROPLASTY ANTERIOR APPROACH;  Surgeon: Gaynelle Arabian, MD;  Location: [facility];  Service: Orthopedics;  Laterality: Left;  148min   TOTAL HIP ARTHROPLASTY Right 09/30/2020   Procedure: TOTAL HIP ARTHROPLASTY ANTERIOR APPROACH;  Surgeon: Gaynelle Arabian, MD;  Location: [facility];  Service: Orthopedics;  Laterality: Right;  175min   TRANSTHORACIC ECHOCARDIOGRAM  02/2020; 10/01/2020   a) EF 55 to 60%.  GR 1 DD.  No R WMA.  Trivial MR.  Otherwise normal.; b) EF 55 to 60%.  Normal LV function and no wall motion abnormality.  Normal RV.  Normal valves.[name]   VASECTOMY     WISDOM TOOTH EXTRACTION      Social History   Socioeconomic History   Marital status: Married    Spouse name: Not on file   Number of children: Not on file   Years of education: Not on file   Highest education level: Not on file  Occupational History   Not on file  Tobacco Use   Smoking status: Former    Packs/day: 1.00    Years: 5.00    Pack years: 5.00    Types: Cigarettes    Quit date: 10/17/1968    Years since quitting: 53.0   Smokeless tobacco: Never  Vaping Use   Vaping Use: Never used  Substance and Sexual Activity   Alcohol use: Not Currently    Comment: "quit drinking ~ 10/1968   Drug use: No   Sexual activity: Not on file  Other Topics Concern   Not on file  Social History Narrative   Not on file   Social Determinants of Health   Financial Resource Strain: Low Risk    Difficulty of Paying Living Expenses: Not hard at all  Food  Insecurity: No Food Insecurity   Worried About Charity fundraiser in the Last Year: Never true   [place] in the Last Year: Never true  Transportation Needs: No Transportation Needs   Lack of Transportation (Medical): No   Lack of Transportation (Non-Medical): No  Physical Activity: Insufficiently Active   Days of Exercise per Week: 3 days   Minutes of Exercise per Session: 30 min  Stress: No Stress Concern Present   Feeling of Stress : Not at all  Social Connections: Moderately Isolated   Frequency of Communication with Friends and Family: Three times a week   Frequency of Social Gatherings with Friends and Family: Three times a week   Attends Religious Services: Never   Active Member of Clubs or Organizations: No   Attends Music therapist: Never   Marital Status: Married  Human resources officer Violence: Not At Risk   Fear of Current or Ex-Partner: No   Emotionally Abused: No   Physically Abused: No   Sexually Abused: No    Family History  Problem Relation Age of Onset   Hypertension Mother    Hypertension Father    Arthritis Sister        rheumatoid   Stroke Sister 60    ROS: no fevers or chills, productive cough, hemoptysis, dysphasia, odynophagia, melena, hematochezia, dysuria, hematuria, rash, seizure activity, orthopnea, PND, pedal edema, claudication. Remaining systems are negative.  Physical Exam: Well-developed well-nourished in no acute distress.  Skin is warm and dry.  HEENT is normal.  Neck is supple.  Chest is clear to auscultation with normal expansion.  Cardiovascular exam is regular rate and rhythm.  Abdominal exam nontender or distended. No masses palpated. Extremities show no edema. neuro grossly intact  ECG-normal sinus rhythm at a rate of 75, normal axis, no ST changes.  Personally reviewed  A/P  1 chest tightness-etiology unclear.  It has been persistent for 2 weeks and his electrocardiogram is normal.  I think ischemia is  unlikely.  We will arrange a stress nuclear study to screen for obstructive coronary disease.  He will follow-up with Dr. Ellyn Hack shortly after his nuclear study.  2 dyspnea-he does not appear to be volume overloaded on examination today but does state that he develops increasing lower extremity edema towards the end of the day and has gained 5 to 6 pounds.  I will give him Lasix 20 mg daily for 2 days then daily as needed thereafter.  In 1 week check potassium, renal function and BNP.  Repeat echocardiogram.  Note he has not traveled recently and there are no other risk factors for pulmonary embolus.  2 history of coronary artery disease-continue aspirin and Praluent.  He is intolerant to statins.  3 hypertension-patient's amlodipine was increased from 5 to 10 mg daily several months ago.  I wonder if this could be contributing to his lower extremity edema.  We will decrease to 5 mg daily and continue losartan.  We also adding low-dose diuretic.  Follow blood pressure and adjust medications as needed.  4 hyperlipidemia-patient is intolerant to statins.  Continue Praluent.  Kirk Ruths, MD

## 2021-10-13 ENCOUNTER — Telehealth (HOSPITAL_COMMUNITY): Payer: Self-pay | Admitting: *Deleted

## 2021-10-13 NOTE — Telephone Encounter (Signed)
Patient's wife per DPR was given detailed instructions per Myocardial Perfusion Study Information Sheet for the test on 03/08/223 at 0815. Patient notified to arrive 15 minutes early and that it is imperative to arrive on time for appointment to keep from having the test rescheduled. ? If you need to cancel or reschedule your appointment, please call the office within 24 hours of your appointment. . Patient verbalized understanding.Luis Guzman, Ranae [place] ? ? ?

## 2021-10-18 ENCOUNTER — Other Ambulatory Visit: Payer: Self-pay

## 2021-10-18 ENCOUNTER — Ambulatory Visit (INDEPENDENT_AMBULATORY_CARE_PROVIDER_SITE_OTHER): Payer: Medicare Other

## 2021-10-18 DIAGNOSIS — R0609 Other forms of dyspnea: Secondary | ICD-10-CM

## 2021-10-18 HISTORY — PX: TRANSTHORACIC ECHOCARDIOGRAM: SHX275

## 2021-10-18 LAB — ECHOCARDIOGRAM COMPLETE
AR max vel: 2.54 cm2
AV Area VTI: 2.55 cm2
AV Area mean vel: 2.39 cm2
AV Mean grad: 3 mmHg
AV Peak grad: 5.6 mmHg
AV Vena cont: 0.31 cm
Ao pk vel: 1.18 m/s
Area-P 1/2: 3.04 cm2
Calc EF: 66.1 %
P 1/2 time: 687 msec
S' Lateral: 3.02 cm
Single Plane A2C EF: 63.2 %
Single Plane A4C EF: 67.5 %

## 2021-10-19 LAB — BASIC METABOLIC PANEL
BUN/Creatinine Ratio: 14 (ref 10–24)
BUN: 13 mg/dL (ref 8–27)
CO2: 20 mmol/L (ref 20–29)
Calcium: 9.6 mg/dL (ref 8.6–10.2)
Chloride: 104 mmol/L (ref 96–106)
Creatinine, Ser: 0.9 mg/dL (ref 0.76–1.27)
Glucose: 107 mg/dL — ABNORMAL HIGH (ref 70–99)
Potassium: 3.6 mmol/L (ref 3.5–5.2)
Sodium: 143 mmol/L (ref 134–144)
eGFR: 89 mL/min/{1.73_m2} (ref >59)

## 2021-10-19 LAB — PRO B NATRIURETIC PEPTIDE: NT-Pro BNP: <36 pg/mL (ref 0–486)

## 2021-10-20 ENCOUNTER — Other Ambulatory Visit: Payer: Self-pay

## 2021-10-20 ENCOUNTER — Ambulatory Visit (HOSPITAL_COMMUNITY): Payer: Medicare Other | Attending: Internal Medicine

## 2021-10-20 ENCOUNTER — Encounter (HOSPITAL_COMMUNITY): Payer: Medicare Other

## 2021-10-20 DIAGNOSIS — R0609 Other forms of dyspnea: Secondary | ICD-10-CM | POA: Diagnosis not present

## 2021-10-20 HISTORY — PX: NM MYOVIEW LTD: HXRAD82

## 2021-10-20 LAB — MYOCARDIAL PERFUSION IMAGING
Angina Index: 2
Duke Treadmill Score: -6
Estimated workload: 8.9
Exercise duration (min): 7 min
Exercise duration (sec): 16 s
LV dias vol: 81 mL (ref 62–150)
LV sys vol: 34 mL
Nuc Stress EF: 58 %
Peak HR: 136 {beats}/min
Percent HR: 93 %
Rest HR: 66 {beats}/min
Rest Nuclear Isotope Dose: 9.6 mCi
SDS: 0
SRS: 0
SSS: 0
ST Depression (mm): 1 mm
Stress Nuclear Isotope Dose: 31.5 mCi
TID: 0.86

## 2021-10-20 MED ORDER — TECHNETIUM TC 99M TETROFOSMIN IV KIT
31.5000 | PACK | Freq: Once | INTRAVENOUS | Status: AC | PRN
  Administered 2021-10-20: 31.5 via INTRAVENOUS
  Filled 2021-10-20: qty 32

## 2021-10-20 MED ORDER — TECHNETIUM TC 99M TETROFOSMIN IV KIT
9.6000 | PACK | Freq: Once | INTRAVENOUS | Status: AC | PRN
  Administered 2021-10-20: 9.6 via INTRAVENOUS
  Filled 2021-10-20: qty 10

## 2021-10-22 ENCOUNTER — Encounter: Payer: Self-pay | Admitting: Cardiology

## 2021-10-25 NOTE — Telephone Encounter (Signed)
Patient called requesting his test results. Advised him they have not been reviewed by Dr. Stanford Breed yet at this time, but that he will be called back in regards to it once he has given his interpretation.  ?

## 2021-10-28 ENCOUNTER — Other Ambulatory Visit: Payer: Self-pay | Admitting: *Deleted

## 2021-11-02 ENCOUNTER — Telehealth: Payer: Self-pay | Admitting: Cardiology

## 2021-11-02 MED ORDER — METOPROLOL SUCCINATE ER 25 MG PO TB24: 12.5000 mg | ORAL_TABLET | Freq: Every day | ORAL | 3 refills | Status: DC

## 2021-11-02 NOTE — Telephone Encounter (Signed)
Spoke with patient about test results  ?Rx(s) sent to pharmacy electronically. ? ?He reports he has had to use NTG a few times every week - sometimes one a day, sometimes three per day.  ? ?He was give PRN lasix at last visit with Stanford Breed MD but is having to use daily and still has some slightly puffy ankles/feet. Reports weight is up 1-2lbs from baseline (but improved from where it was at last visit, which was up about 6lbs) ? ?Advised will send message to MD with this update and Ivin Booty RN to review schedule for sooner visit ?

## 2021-11-02 NOTE — Telephone Encounter (Signed)
Probably just need to have him seen & bring him in for Cath. ? ?Glenetta Hew, MD ? ?

## 2021-11-02 NOTE — Telephone Encounter (Signed)
Imaging portion of ST was Negative - but the ECG portion showed frequent PVCs -- would like to start Toprol 12.5 mg qAM ? ?Disp: Toprol 12.5 mg daily, 30d supply, 3 refills. ? ?Will need to have him seen by me or APP to reassess soon. ? ?Glenetta Hew ?c ?

## 2021-11-02 NOTE — Telephone Encounter (Signed)
Follow Up: ? ? ? ?Patient is calling to find out his results from his Stress Test from 10-20-21 please. ?

## 2021-11-03 NOTE — Telephone Encounter (Signed)
Spoke with patient and scheduled him for visit with APP on 11/05/21.  ?Dr. Ellyn Hack is out of office this week and no appointments available next week either  ?

## 2021-11-05 ENCOUNTER — Other Ambulatory Visit: Payer: Self-pay

## 2021-11-05 ENCOUNTER — Encounter: Payer: Self-pay | Admitting: Physician Assistant

## 2021-11-05 ENCOUNTER — Ambulatory Visit: Payer: Medicare Other | Admitting: Physician Assistant

## 2021-11-05 VITALS — BP 134/86 | HR 67 | Ht 73.0 in | Wt 192.2 lb

## 2021-11-05 DIAGNOSIS — I25118 Atherosclerotic heart disease of native coronary artery with other forms of angina pectoris: Secondary | ICD-10-CM

## 2021-11-05 DIAGNOSIS — I10 Essential (primary) hypertension: Secondary | ICD-10-CM | POA: Diagnosis not present

## 2021-11-05 DIAGNOSIS — E119 Type 2 diabetes mellitus without complications: Secondary | ICD-10-CM | POA: Diagnosis not present

## 2021-11-05 DIAGNOSIS — I25112 Atherosclerotic heart disease of native coronary artery with refractory angina pectoris: Secondary | ICD-10-CM

## 2021-11-05 DIAGNOSIS — E785 Hyperlipidemia, unspecified: Secondary | ICD-10-CM

## 2021-11-05 DIAGNOSIS — Z01810 Encounter for preprocedural cardiovascular examination: Secondary | ICD-10-CM

## 2021-11-05 NOTE — Patient Instructions (Addendum)
Medication Instructions:  ?Your physician recommends that you continue on your current medications as directed. Please refer to the Current Medication list given to you today. ? ?*If you need a refill on your cardiac medications before your next appointment, please call your pharmacy* ? ?Lab Work: ?Your physician recommends that you return for lab work TODAY:  ?BMET ?CBC ?If you have labs (blood work) drawn today and your tests are completely normal, you will receive your results only by: ?MyChart Message (if you have MyChart) OR ?A paper copy in the mail ?If you have any lab test that is abnormal or we need to change your treatment, we will call you to review the results. ? ?Testing/Procedures: ?Your physician has requested that you have a cardiac catheterization. Cardiac catheterization is used to diagnose and/or treat various heart conditions. Doctors may recommend this procedure for a number of different reasons. The most common reason is to evaluate chest pain. Chest pain can be a symptom of coronary artery disease (CAD), and cardiac catheterization can show whether plaque is narrowing or blocking your heart?s arteries. This procedure is also used to evaluate the valves, as well as measure the blood flow and oxygen levels in different parts of your heart. For further information please visit [email or web address]. Please follow instruction sheet, as given. ? ?Follow-Up: ?At [facility], you and your health needs are our priority.  As part of our continuing mission to provide you with exceptional heart care, we have created designated Provider Care Teams.  These Care Teams include your primary Cardiologist (physician) and Advanced Practice Providers (APPs -  Physician Assistants and Nurse Practitioners) who all work together to provide you with the care you need, when you need it. ? ?We recommend signing up for the patient portal called "MyChart".  Sign up information is provided on this After Visit Summary.   MyChart is used to connect with patients for Virtual Visits (Telemedicine).  Patients are able to view lab/test results, encounter notes, upcoming appointments, etc.  Non-urgent messages can be sent to your provider as well.   ?To learn more about what you can do with MyChart, go to [email or web address].   ? ?Your next appointment:   ?As previously scheduled-01/04/22 at 11:40 AM   ? ?The format for your next appointment:   ?In Person ? ?Provider:   ?Glenetta Hew, MD   ? ? ?Other Instructions ? ? ?[place] ?[place] [address] ?[place] [id number] ?Dept: [phone] ?Loc: [phone] ? ?Luis Guzman  11/05/2021 ? ?You are scheduled for a Cardiac Catheterization on Wednesday, March 29 with Dr. Glenetta Hew. ? ?1. Please arrive at the Main Entrance A at [facility]: [place], [place] [address] at 11:00 AM (This time is two hours before your procedure to ensure your preparation). Free valet parking service is available.  ? ?Special note: Every effort is made to have your procedure done on time. Please understand that emergencies sometimes delay scheduled procedures. ? ?2. Diet: Do not eat solid foods after midnight.  You may have clear liquids until 5 AM upon the day of the procedure. ? ?3. Labs: You will need to have blood drawn on Friday, March 24 at [place], [place]  ?Open: 8am - 5pm (Lunch 12:30 - 1:30)   Phone: [phone]. You do not need to be fasting. ? ?4. Medication instructions in preparation for your procedure: ? ? Contrast Allergy: No ? ? ?  Stop taking, Cozaar (Losartan) Wednesday, March 29, ?Stop taking Lasix (Furosemide) Monday, March 27 ? ? ?On the morning of your procedure, take Aspirin and any morning medicines NOT listed above.  You may use sips of water. ? ?5. Plan to go home the same day, you will only stay overnight if medically necessary. ?6. You MUST  have a responsible adult to drive you home. ?7. An adult MUST be with you the first 24 hours after you arrive home. ?8. Bring a current list of your medications, and the last time and date medication taken. ?9. Bring ID and current insurance cards. ?10.Please wear clothes that are easy to get on and off and wear slip-on shoes. ? ?Thank you for allowing [place] to care for you! ?  -- [facility] ? ? ?

## 2021-11-05 NOTE — Progress Notes (Addendum)
?Cardiology Office Note:   ? ?Date:  11/08/2021  ? ?ID:  Luis Guzman, DOB 1946-07-28, MRN [id number] ? ?PCP:  Eulas Post, MD ?  ?[facility] Providers ?Cardiologist:  Glenetta Hew, MD    ? ?Referring MD: Eulas Post, MD  ? ?Chief Complaint  ?Patient presents with  ? Chest Pain  ?  Discussed with Dr. Ellyn Hack  ? ? ?History of Present Illness:   ? ?Luis Guzman is a 76 y.o. male with a hx of CAD, AAA, hypertension, hyperlipidemia and DM2.  Previous cardiac catheterization in July 2020 demonstrated 65% left circumflex lesion with negative FFR, 85% D1 and a 90% PDA.  Patient underwent angioplasty of D1 and DES to PDA vessel.  He had repeat scoring balloon angioplasty of D1 in November 2020.  Echocardiogram in February 2022 showed normal EF, mild LVH, mild aortic insufficiency.  He contacted our office in February with complaint of chest tightness and increased lower extremity edema, weight gain and dyspnea.  He was seen by Dr. Stanford Breed.  He is chest pain was persistent for over 2 weeks without complete resolution.  He was prescribed 20 mg Lasix for 2 days before changed to as needed.  Amlodipine was decreased to 5 mg daily due to lower extremity edema.  Patient did not appear to be volume overloaded by physical exam.  Echocardiogram obtained on 10/18/2021 demonstrated EF 67%, grade 1 DD, mild MR, mild to moderate AI.  Subsequent Myoview demonstrated 1 mm horizontal ST depression in V4-V5, EF 58%, frequent exercise induced PVCs including triplet, inferior perfusion defect that improved with stress consistent with artifact.  Although EKG portion was positive, however stress test was found to be negative by perfusion imaging.  Overall moderate risk study. ? ?Talking with the patient today, he says he is experiencing intermittent chest pressure-like sensation since the last visit.  Symptoms seems to be increasing in frequency and associated with worsening dizziness and worsening dyspnea on exertion.  He  actually experienced chest tightness radiating to the neck while on the treadmill during the last stress test.  His overall symptom is concerning for angina.  I discussed the case with Dr. Ellyn Hack who recommended proceeding with cardiac catheterization.  We will obtain blood work today. ? ? ?Past Medical History:  ?Diagnosis Date  ? AAA (abdominal aortic aneurysm)   ? Anemia   ? as infant history of  ? CAD S/P PTCA & DES PCI - for Progressive Angina 02/26/2019  ? Cath-PCI 02/26/2019: EF 55-65%. mCx 65% (FFR 0.82 - Med Rx). D2 85% - Wolverine Scoring Balloon PTCA (2.0 mm). Ost rPDA - 90% -Resolute Onyx 2.5 x 15 (2.6 mm). --> 06/2019: staged DES PCI ost D1 (RESOLUTE ONYX 2.25 mm x 50 mm (2.6 mm) positioned to avoid the true ostium)  ? Elevated PSA   ? GERD (gastroesophageal reflux disease)   ? History of diabetes mellitus, type II   ? loss weight under control currently  ? History of hiatal hernia   ? 40 yrs ago  ? History of pancreatitis   ? elevated lipase levels   ? HYPERLIPIDEMIA 01/26/2009  ? HYPERTENSION 01/26/2009  ? 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 occ. lower back pain, s/p cervical neck surgery"stiffness" remains  ? Transfusion history   ? infant "anemia"  ? ? ?Past Surgical History:  ?Procedure Laterality Date  ? APPENDECTOMY  ~ 1959  ? BACK SURGERY    ? CERVICAL DISC ARTHROPLASTY N/A 07/30/2018  ? Procedure: Cervical  five-six Cervical six-seven Artificial disc replacement;  Surgeon: Kristeen Miss, MD;  Location: [place];  Service: Neurosurgery;  Laterality: N/A;  ? CERVICAL LAMINECTOMY  1997  ? C 4 and C 5  ? CHOLECYSTECTOMY N/A 04/15/2014  ? Procedure: LAPAROSCOPIC CHOLECYSTECTOMY WITH INTRAOPERATIVE CHOLANGIOGRAM;  Surgeon: Armandina Gemma, MD;  Location: [facility];  Service: General;  Laterality: N/A;  ? COLONOSCOPY    ? CORONARY BALLOON ANGIOPLASTY N/A 02/26/2019  ? Procedure: CORONARY BALLOON ANGIOPLASTY;  Surgeon: Leonie Man, MD;  Location: [place] [facility];  Service: Cardiovascular  -> scoring balloon PTCA (Wolverine 2.0 mm) of ostial D2 85% reducing to 20-30%.  ? CORONARY STENT INTERVENTION N/A 02/26/2019  ? Procedure: CORONARY STENT INTERVENTION;  Surgeon: Leonie Man, MD;  Location: [facility] INVASIVE CV LAB;; PCI ostial RPDA (initial attempt of PTCA only led to small local tear/dissection covered with stent) Resolute Onyx 2.5 mm x 15 mm (2.6 mm  ? CORONARY STENT INTERVENTION N/A 07/05/2019  ? Procedure: CORONARY STENT INTERVENTION;  Surgeon: Leonie Man, MD;  Location: [place] [facility];  Service: Cardiovascular;; ostial D1 95% (restenosis of PTCA site) -> DES PCI RESOLUTE ONYX 2.25 mm x 50 mm (2.6 mm) positioned to avoid the true ostium.   ? ESOPHAGOGASTRODUODENOSCOPY N/A 03/13/2014  ? Procedure: ESOPHAGOGASTRODUODENOSCOPY (EGD);  Surgeon: Wonda Horner, MD;  Location: [facility]COPY;  Service: Endoscopy;  Laterality: N/A;  ? INTRAVASCULAR PRESSURE WIRE/FFR STUDY N/A 02/26/2019  ? Procedure: INTRAVASCULAR PRESSURE WIRE/FFR STUDY;  Surgeon: Leonie Man, MD;  Location: [place] [facility];  Service: Cardiovascular;; mCx ~65% - DFR 0.92, FR 0.82 - BORDERLINE--> MED Rx  ? LEFT HEART CATH AND CORONARY ANGIOGRAPHY N/A 02/26/2019  ? Procedure: LEFT HEART CATH AND CORONARY ANGIOGRAPHY;  Surgeon: Leonie Man, MD;  Location: [place] [facility];  EF 55-65%. mCx 65% (FFR 0.82 - Med Rx). D2 85% - Wolverine Scoring Balloon PTCA (2.0 mm). Ost rPDA - 90% -Resolute Onyx 2.5 x 15 (2.6 mm).   ? LEFT HEART CATH AND CORONARY ANGIOGRAPHY N/A 07/05/2019  ? Procedure: LEFT HEART CATH AND CORONARY ANGIOGRAPHY;  Surgeon: Leonie Man, MD;  Location: [facility] INVASIVE CV LAB;; ostial D1 95% (restenosis of PTCA site) -> DES PCI.  PDA stent widely patent.  Mid CX stable 65% lesion.  Proximal RCA 40%.  Mid RCA 45%.  Normal EDP.  ? ORIF FIBULA FRACTURE Left 09/2008  ? compartment syndrome  ? SHOULDER ARTHROSCOPY Bilateral   ? SHOULDER ARTHROSCOPY W/ ROTATOR CUFF REPAIR Bilateral 2004-2009  ? left 2004, right 2009  ?  TOTAL HIP ARTHROPLASTY Left 03/04/2020  ? Procedure: TOTAL HIP ARTHROPLASTY ANTERIOR APPROACH;  Surgeon: Gaynelle Arabian, MD;  Location: [facility];  Service: Orthopedics;  Laterality: Left;  152mn  ? TOTAL HIP ARTHROPLASTY Right 09/30/2020  ? Procedure: TOTAL HIP ARTHROPLASTY ANTERIOR APPROACH;  Surgeon: AGaynelle Arabian MD;  Location[facility];  Service: Orthopedics;  Laterality: Right;  1052m  ? TRANSTHORACIC ECHOCARDIOGRAM  02/2020; 10/01/2020  ? a) EF 55 to 60%.  GR 1 DD.  No R WMA.  Trivial MR.  Otherwise normal.; b) EF 55 to 60%.  Normal LV function and no wall motion abnormality.  Normal RV.  Normal valves..  ? VASECTOMY    ? WISDOM TOOTH EXTRACTION    ? ? ?Current Medications: ?Current Meds  ?Medication Sig  ? acetaminophen (TYLENOL) 500 MG tablet TAKE TWO TABLETS BY MOUTH THREE TIMES A DAY AS NEEDED  ? Alirocumab (PRALUENT) 150 MG/ML SOAJ Inject  150 mg into the skin every 14 (fourteen) days.  ? amLODipine (NORVASC) 5 MG tablet Take 1 tablet (5 mg total) by mouth daily.  ? aspirin EC 81 MG tablet Take 1 tablet (81 mg total) by mouth daily. Swallow whole. ( start after stopping clopidogrel 75 mg)  ? bimatoprost (LUMIGAN) 0.03 % ophthalmic solution Place 1 drop into both eyes at bedtime.  ? Brinzolamide-Brimonidine (SIMBRINZA) 1-0.2 % SUSP Place 1 drop into both eyes in the morning and at bedtime.  ? furosemide (LASIX) 20 MG tablet Take 1 tablet daily as needed for shortness of breath or weight gain  ? ibuprofen (ADVIL) 200 MG tablet Take 200 mg by mouth every 6 (six) hours as needed.  ? isosorbide mononitrate (IMDUR) 30 MG 24 hr tablet TAKE 1 TABLET BY MOUTH IN  THE EVENING  ? losartan (COZAAR) 100 MG tablet Take 100 mg by mouth daily.  ? metoprolol succinate (TOPROL XL) 25 MG 24 hr tablet Take 0.5 tablets (12.5 mg total) by mouth daily.  ? nitroGLYCERIN (NITROSTAT) 0.4 MG SL tablet Place 1 tablet (0.4 mg total) under the tongue every 5 (five) minutes as needed for chest pain.  ? traZODone (DESYREL) 50 MG tablet Take  50 mg by mouth at bedtime.  ?  ? ?Allergies:   Atorvastatin and Penicillins  ? ?Social History  ? ?Socioeconomic History  ? Marital status: Married  ?  Spouse name: Not on file  ? Number of children: Not o

## 2021-11-05 NOTE — H&P (View-Only) (Signed)
?Cardiology Office Note:   ? ?Date:  11/08/2021  ? ?ID:  Luis Guzman, DOB [birth year], MRN [id number] ? ?PCP:  Eulas Post, MD ?  ?[facility] Providers ?Cardiologist:  Glenetta Hew, MD    ? ?Referring MD: Eulas Post, MD  ? ?Chief Complaint  ?Patient presents with  ? Chest Pain  ?  Discussed with Dr. Ellyn Hack  ? ? ?History of Present Illness:   ? ?Luis Guzman is a 76 y.o. male with a hx of CAD, AAA, hypertension, hyperlipidemia and DM2.  Previous cardiac catheterization in July 2020 demonstrated 65% left circumflex lesion with negative FFR, 85% D1 and a 90% PDA.  Patient underwent angioplasty of D1 and DES to PDA vessel.  He had repeat scoring balloon angioplasty of D1 in November 2020.  Echocardiogram in February 2022 showed normal EF, mild LVH, mild aortic insufficiency.  He contacted our office in February with complaint of chest tightness and increased lower extremity edema, weight gain and dyspnea.  He was seen by Dr. Stanford Breed.  He is chest pain was persistent for over 2 weeks without complete resolution.  He was prescribed 20 mg Lasix for 2 days before changed to as needed.  Amlodipine was decreased to 5 mg daily due to lower extremity edema.  Patient did not appear to be volume overloaded by physical exam.  Echocardiogram obtained on 10/18/2021 demonstrated EF 67%, grade 1 DD, mild MR, mild to moderate AI.  Subsequent Myoview demonstrated 1 mm horizontal ST depression in V4-V5, EF 58%, frequent exercise induced PVCs including triplet, inferior perfusion defect that improved with stress consistent with artifact.  Although EKG portion was positive, however stress test was found to be negative by perfusion imaging.  Overall moderate risk study. ? ?Talking with the patient today, he says he is experiencing intermittent chest pressure-like sensation since the last visit.  Symptoms seems to be increasing in frequency and associated with worsening dizziness and worsening dyspnea on exertion.  He  actually experienced chest tightness radiating to the neck while on the treadmill during the last stress test.  His overall symptom is concerning for angina.  I discussed the case with Dr. Ellyn Hack who recommended proceeding with cardiac catheterization.  We will obtain blood work today. ? ? ?Past Medical History:  ?Diagnosis Date  ? AAA (abdominal aortic aneurysm)   ? Anemia   ? as infant history of  ? CAD S/P PTCA & DES PCI - for Progressive Angina 02/26/2019  ? Cath-PCI 02/26/2019: EF 55-65%. mCx 65% (FFR 0.82 - Med Rx). D2 85% - Wolverine Scoring Balloon PTCA (2.0 mm). Ost rPDA - 90% -Resolute Onyx 2.5 x 15 (2.6 mm). --> 06/2019: staged DES PCI ost D1 (RESOLUTE ONYX 2.25 mm x 50 mm (2.6 mm) positioned to avoid the true ostium)  ? Elevated PSA   ? GERD (gastroesophageal reflux disease)   ? History of diabetes mellitus, type II   ? loss weight under control currently  ? History of hiatal hernia   ? 40 yrs ago  ? History of pancreatitis   ? elevated lipase levels   ? HYPERLIPIDEMIA 01/26/2009  ? HYPERTENSION 01/26/2009  ? 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 occ. lower back pain, s/p cervical neck surgery"stiffness" remains  ? Transfusion history   ? infant "anemia"  ? ? ?Past Surgical History:  ?Procedure Laterality Date  ? APPENDECTOMY  ~ 1959  ? BACK SURGERY    ? CERVICAL DISC ARTHROPLASTY N/A 07/30/2018  ? Procedure: Cervical  five-six Cervical six-seven Artificial disc replacement;  Surgeon: Kristeen Miss, MD;  Location: [place];  Service: Neurosurgery;  Laterality: N/A;  ? CERVICAL LAMINECTOMY  1997  ? C 4 and C 5  ? CHOLECYSTECTOMY N/A 04/15/2014  ? Procedure: LAPAROSCOPIC CHOLECYSTECTOMY WITH INTRAOPERATIVE CHOLANGIOGRAM;  Surgeon: Armandina Gemma, MD;  [facility];  Service: General;  Laterality: N/A;  ? COLONOSCOPY    ? CORONARY BALLOON ANGIOPLASTY N/A 02/26/2019  ? Procedure: CORONARY BALLOON ANGIOPLASTY;  Surgeon: Leonie Man, MD;  Location: [facility];  Service: Cardiovascular  -> scoring balloon PTCA (Wolverine 2.0 mm) of ostial D2 85% reducing to 20-30%.  ? CORONARY STENT INTERVENTION N/A 02/26/2019  ? Procedure: CORONARY STENT INTERVENTION;  Surgeon: Leonie Man, MD;  Location: [facility] INVASIVE CV LAB;; PCI ostial RPDA (initial attempt of PTCA only led to small local tear/dissection covered with stent) Resolute Onyx 2.5 mm x 15 mm (2.6 mm  ? CORONARY STENT INTERVENTION N/A 07/05/2019  ? Procedure: CORONARY STENT INTERVENTION;  Surgeon: Leonie Man, MD;  Location: [place] [facility];  Service: Cardiovascular;; ostial D1 95% (restenosis of PTCA site) -> DES PCI RESOLUTE ONYX 2.25 mm x 50 mm (2.6 mm) positioned to avoid the true ostium.   ? ESOPHAGOGASTRODUODENOSCOPY N/A 03/13/2014  ? Procedure: ESOPHAGOGASTRODUODENOSCOPY (EGD);  Surgeon: Wonda Horner, MD;  Location: [facility];  Service: Endoscopy;  Laterality: N/A;  ? INTRAVASCULAR PRESSURE WIRE/FFR STUDY N/A 02/26/2019  ? Procedure: INTRAVASCULAR PRESSURE WIRE/FFR STUDY;  Surgeon: Leonie Man, MD;  Location: [place] [facility];  Service: Cardiovascular;; mCx ~65% - DFR 0.92, FR 0.82 - BORDERLINE--> MED Rx  ? LEFT HEART CATH AND CORONARY ANGIOGRAPHY N/A 02/26/2019  ? Procedure: LEFT HEART CATH AND CORONARY ANGIOGRAPHY;  Surgeon: Leonie Man, MD;  Location: [place] [facility];  EF 55-65%. mCx 65% (FFR 0.82 - Med Rx). D2 85% - Wolverine Scoring Balloon PTCA (2.0 mm). Ost rPDA - 90% -Resolute Onyx 2.5 x 15 (2.6 mm).   ? LEFT HEART CATH AND CORONARY ANGIOGRAPHY N/A 07/05/2019  ? Procedure: LEFT HEART CATH AND CORONARY ANGIOGRAPHY;  Surgeon: Leonie Man, MD;  Location: [facility] INVASIVE CV LAB;; ostial D1 95% (restenosis of PTCA site) -> DES PCI.  PDA stent widely patent.  Mid CX stable 65% lesion.  Proximal RCA 40%.  Mid RCA 45%.  Normal EDP.  ? ORIF FIBULA FRACTURE Left 09/2008  ? compartment syndrome  ? SHOULDER ARTHROSCOPY Bilateral   ? SHOULDER ARTHROSCOPY W/ ROTATOR CUFF REPAIR Bilateral 2004-2009  ? left 2004, right 2009  ?  TOTAL HIP ARTHROPLASTY Left 03/04/2020  ? Procedure: TOTAL HIP ARTHROPLASTY ANTERIOR APPROACH;  Surgeon: Gaynelle Arabian, MD;  [facility];  Service: Orthopedics;  Laterality: Left;  154mn  ? TOTAL HIP ARTHROPLASTY Right 09/30/2020  ? Procedure: TOTAL HIP ARTHROPLASTY ANTERIOR APPROACH;  Surgeon: AGaynelle Arabian MD;  [facility];  Service: Orthopedics;  Laterality: Right;  1064m  ? TRANSTHORACIC ECHOCARDIOGRAM  02/2020; 10/01/2020  ? a) EF 55 to 60%.  GR 1 DD.  No R WMA.  Trivial MR.  Otherwise normal.; b) EF 55 to 60%.  Normal LV function and no wall motion abnormality.  Normal RV.  Normal valves..  ? VASECTOMY    ? WISDOM TOOTH EXTRACTION    ? ? ?Current Medications: ?Current Meds  ?Medication Sig  ? acetaminophen (TYLENOL) 500 MG tablet TAKE TWO TABLETS BY MOUTH THREE TIMES A DAY AS NEEDED  ? Alirocumab (PRALUENT) 150 MG/ML SOAJ Inject  150 mg into the skin every 14 (fourteen) days.  ? amLODipine (NORVASC) 5 MG tablet Take 1 tablet (5 mg total) by mouth daily.  ? aspirin EC 81 MG tablet Take 1 tablet (81 mg total) by mouth daily. Swallow whole. ( start after stopping clopidogrel 75 mg)  ? bimatoprost (LUMIGAN) 0.03 % ophthalmic solution Place 1 drop into both eyes at bedtime.  ? Brinzolamide-Brimonidine (SIMBRINZA) 1-0.2 % SUSP Place 1 drop into both eyes in the morning and at bedtime.  ? furosemide (LASIX) 20 MG tablet Take 1 tablet daily as needed for shortness of breath or weight gain  ? ibuprofen (ADVIL) 200 MG tablet Take 200 mg by mouth every 6 (six) hours as needed.  ? isosorbide mononitrate (IMDUR) 30 MG 24 hr tablet TAKE 1 TABLET BY MOUTH IN  THE EVENING  ? losartan (COZAAR) 100 MG tablet Take 100 mg by mouth daily.  ? metoprolol succinate (TOPROL XL) 25 MG 24 hr tablet Take 0.5 tablets (12.5 mg total) by mouth daily.  ? nitroGLYCERIN (NITROSTAT) 0.4 MG SL tablet Place 1 tablet (0.4 mg total) under the tongue every 5 (five) minutes as needed for chest pain.  ? traZODone (DESYREL) 50 MG tablet Take  50 mg by mouth at bedtime.  ?  ? ?Allergies:   Atorvastatin and Penicillins  ? ?Social History  ? ?Socioeconomic History  ? Marital status: Married  ?  Spouse name: Not on file  ? Number of children: Not o

## 2021-11-06 LAB — BASIC METABOLIC PANEL
BUN/Creatinine Ratio: 13 (ref 10–24)
BUN: 14 mg/dL (ref 8–27)
CO2: 20 mmol/L (ref 20–29)
Calcium: 9.5 mg/dL (ref 8.6–10.2)
Chloride: 103 mmol/L (ref 96–106)
Creatinine, Ser: 1.08 mg/dL (ref 0.76–1.27)
Glucose: 89 mg/dL (ref 70–99)
Potassium: 3.9 mmol/L (ref 3.5–5.2)
Sodium: 141 mmol/L (ref 134–144)
eGFR: 72 mL/min/{1.73_m2} (ref >59)

## 2021-11-06 LAB — CBC
Hematocrit: 46.1 % (ref 37.5–51.0)
Hemoglobin: 15.7 g/dL (ref 13.0–17.7)
MCH: 30.8 pg (ref 26.6–33.0)
MCHC: 34.1 g/dL (ref 31.5–35.7)
MCV: 91 fL (ref 79–97)
Platelets: 231 10*3/uL (ref 150–450)
RBC: 5.09 x10E6/uL (ref 4.14–5.80)
RDW: 12.2 % (ref 11.6–15.4)
WBC: 9.7 10*3/uL (ref 3.4–10.8)

## 2021-11-08 ENCOUNTER — Encounter: Payer: Self-pay | Admitting: Physician Assistant

## 2021-11-08 ENCOUNTER — Other Ambulatory Visit: Payer: Self-pay | Admitting: Physician Assistant

## 2021-11-08 MED ORDER — SODIUM CHLORIDE 0.9% FLUSH: 3.0000 mL | Freq: Two times a day (BID) | INTRAVENOUS | Status: DC

## 2021-11-08 NOTE — Progress Notes (Signed)
Normal red blood cell count, normal renal function. No contraindication for upcoming cath

## 2021-11-09 ENCOUNTER — Telehealth: Payer: Self-pay

## 2021-11-09 ENCOUNTER — Telehealth: Payer: Self-pay | Admitting: *Deleted

## 2021-11-09 NOTE — Telephone Encounter (Signed)
Cardiac Catheterization scheduled at [facility] for: Wednesday November 10, 2021 1 PM ?Arrival time and place: [facility] Main Entrance A at: 65 AM ? ? ?No solid food after midnight prior to cath, clear liquids until 5 AM day of procedure. ? ?Medication instructions: ?-Hold: ? Lasix-AM of procedure ?-Except hold medications usual morning medications can be taken with sips of water including aspirin 81 mg. ? ?Confirmed patient has responsible adult to drive home post procedure and be with patient first 24 hours after arriving home. ? ?Patient reports no new symptoms concerning for COVID-19/no exposure to COVID-19 in the past 10 days. ? ?Reviewed procedure instructions with patient.  ?

## 2021-11-09 NOTE — Telephone Encounter (Addendum)
Left voice message for patient to give me a cal back at the office. ? ?----- Message from Almyra Deforest, [place] sent at 11/08/2021  5:22 PM EDT ----- ?Normal red blood cell count, normal renal function. No contraindication for upcoming cath ?

## 2021-11-10 ENCOUNTER — Other Ambulatory Visit: Payer: Self-pay

## 2021-11-10 ENCOUNTER — Observation Stay (HOSPITAL_COMMUNITY)
Admission: RE | Admit: 2021-11-10 | Discharge: 2021-11-12 | Disposition: A | Discharge status: Home / Self Care | Payer: Medicare Other | Attending: Cardiology | Admitting: Cardiology

## 2021-11-10 ENCOUNTER — Encounter (HOSPITAL_COMMUNITY)
Admission: RE | Disposition: A | Discharge status: Home / Self Care | Payer: Self-pay | Source: Home / Self Care | Attending: Cardiology

## 2021-11-10 DIAGNOSIS — I200 Unstable angina: Secondary | ICD-10-CM | POA: Diagnosis present

## 2021-11-10 DIAGNOSIS — I209 Angina pectoris, unspecified: Secondary | ICD-10-CM | POA: Diagnosis present

## 2021-11-10 DIAGNOSIS — Z79899 Other long term (current) drug therapy: Secondary | ICD-10-CM | POA: Diagnosis not present

## 2021-11-10 DIAGNOSIS — Z87891 Personal history of nicotine dependence: Secondary | ICD-10-CM | POA: Insufficient documentation

## 2021-11-10 DIAGNOSIS — I25119 Atherosclerotic heart disease of native coronary artery with unspecified angina pectoris: Principal | ICD-10-CM | POA: Diagnosis present

## 2021-11-10 DIAGNOSIS — Z7982 Long term (current) use of aspirin: Secondary | ICD-10-CM | POA: Insufficient documentation

## 2021-11-10 DIAGNOSIS — I2510 Atherosclerotic heart disease of native coronary artery without angina pectoris: Secondary | ICD-10-CM | POA: Diagnosis present

## 2021-11-10 DIAGNOSIS — I10 Essential (primary) hypertension: Secondary | ICD-10-CM | POA: Insufficient documentation

## 2021-11-10 DIAGNOSIS — Z96643 Presence of artificial hip joint, bilateral: Secondary | ICD-10-CM | POA: Insufficient documentation

## 2021-11-10 DIAGNOSIS — E119 Type 2 diabetes mellitus without complications: Secondary | ICD-10-CM | POA: Diagnosis not present

## 2021-11-10 HISTORY — PX: LEFT HEART CATH AND CORONARY ANGIOGRAPHY: CATH118249

## 2021-11-10 HISTORY — PX: INTRAVASCULAR PRESSURE WIRE/FFR STUDY: CATH118243

## 2021-11-10 HISTORY — PX: CORONARY PRESSURE/FFR STUDY: CATH118243

## 2021-11-10 LAB — CBC
HCT: 40.6 % (ref 39.0–52.0)
Hemoglobin: 14.2 g/dL (ref 13.0–17.0)
MCH: 31.3 pg (ref 26.0–34.0)
MCHC: 35 g/dL (ref 30.0–36.0)
MCV: 89.6 fL (ref 80.0–100.0)
Platelets: 179 10*3/uL (ref 150–400)
RBC: 4.53 MIL/uL (ref 4.22–5.81)
RDW: 12.7 % (ref 11.5–15.5)
WBC: 8.1 10*3/uL (ref 4.0–10.5)
nRBC: 0 % (ref 0.0–0.2)

## 2021-11-10 LAB — POCT ACTIVATED CLOTTING TIME: Activated Clotting Time: 233 seconds

## 2021-11-10 LAB — CREATININE, SERUM
Creatinine, Ser: 0.84 mg/dL (ref 0.61–1.24)
GFR, Estimated: >60 mL/min (ref >60)

## 2021-11-10 SURGERY — LEFT HEART CATH AND CORONARY ANGIOGRAPHY
Anesthesia: LOCAL

## 2021-11-10 MED ORDER — SODIUM CHLORIDE 0.9 % IV SOLN
250.0000 mL | INTRAVENOUS | Status: DC | PRN
  Administered 2021-11-10: 250 mL via INTRAVENOUS

## 2021-11-10 MED ORDER — ONDANSETRON HCL 4 MG/2ML IJ SOLN: 4.0000 mg | Freq: Four times a day (QID) | INTRAMUSCULAR | Status: DC | PRN

## 2021-11-10 MED ORDER — HEPARIN SODIUM (PORCINE) 5000 UNIT/ML IJ SOLN: 5000.0000 [IU] | Freq: Three times a day (TID) | INTRAMUSCULAR | Status: DC

## 2021-11-10 MED ORDER — ACETAMINOPHEN 325 MG PO TABS
650.0000 mg | ORAL_TABLET | ORAL | Status: DC | PRN
  Administered 2021-11-11: 650 mg via ORAL

## 2021-11-10 MED ORDER — SODIUM CHLORIDE 0.9 % IV SOLN: INTRAVENOUS | Status: AC

## 2021-11-10 MED ORDER — HYDRALAZINE HCL 20 MG/ML IJ SOLN
10.0000 mg | INTRAMUSCULAR | Status: AC | PRN
  Administered 2021-11-10: 10 mg via INTRAVENOUS
  Filled 2021-11-10: qty 1

## 2021-11-10 MED ORDER — HEPARIN SODIUM (PORCINE) 1000 UNIT/ML IJ SOLN
INTRAMUSCULAR | Status: AC
Start: 2021-11-10 — End: ?
  Filled 2021-11-10: qty 10

## 2021-11-10 MED ORDER — SODIUM CHLORIDE 0.9 % WEIGHT BASED INFUSION
3.0000 mL/kg/h | INTRAVENOUS | Status: DC
  Administered 2021-11-10: 3 mL/kg/h via INTRAVENOUS

## 2021-11-10 MED ORDER — ACETAMINOPHEN 500 MG PO TABS: 1000.0000 mg | ORAL_TABLET | Freq: Four times a day (QID) | ORAL | Status: DC | PRN

## 2021-11-10 MED ORDER — NITROGLYCERIN 0.4 MG SL SUBL: 0.4000 mg | SUBLINGUAL_TABLET | SUBLINGUAL | Status: DC | PRN

## 2021-11-10 MED ORDER — SODIUM CHLORIDE 0.9 % WEIGHT BASED INFUSION: 1.0000 mL/kg/h | INTRAVENOUS | Status: DC

## 2021-11-10 MED ORDER — HYDRALAZINE HCL 25 MG PO TABS
25.0000 mg | ORAL_TABLET | Freq: Once | ORAL | Status: AC
  Administered 2021-11-10: 25 mg via ORAL
  Filled 2021-11-10: qty 1

## 2021-11-10 MED ORDER — ACETAMINOPHEN 325 MG PO TABS
650.0000 mg | ORAL_TABLET | ORAL | Status: DC | PRN
  Filled 2021-11-10: qty 2

## 2021-11-10 MED ORDER — METOPROLOL SUCCINATE 12.5 MG HALF TABLET
12.5000 mg | ORAL_TABLET | Freq: Every day | ORAL | Status: DC
  Administered 2021-11-11 – 2021-11-12 (×2): 12.5 mg via ORAL
  Filled 2021-11-10 (×2): qty 1

## 2021-11-10 MED ORDER — TRAZODONE HCL 50 MG PO TABS
50.0000 mg | ORAL_TABLET | Freq: Every day | ORAL | Status: DC
  Administered 2021-11-11 (×2): 50 mg via ORAL
  Filled 2021-11-10 (×3): qty 1

## 2021-11-10 MED ORDER — FENTANYL CITRATE (PF) 100 MCG/2ML IJ SOLN
INTRAMUSCULAR | Status: AC
  Filled 2021-11-10: qty 2

## 2021-11-10 MED ORDER — SODIUM CHLORIDE 0.9% FLUSH: 3.0000 mL | INTRAVENOUS | Status: DC | PRN

## 2021-11-10 MED ORDER — RANOLAZINE ER 500 MG PO TB12
500.0000 mg | ORAL_TABLET | Freq: Two times a day (BID) | ORAL | Status: DC
  Administered 2021-11-10 – 2021-11-12 (×4): 500 mg via ORAL
  Filled 2021-11-10 (×5): qty 1

## 2021-11-10 MED ORDER — ONDANSETRON HCL 4 MG/2ML IJ SOLN
4.0000 mg | Freq: Four times a day (QID) | INTRAMUSCULAR | Status: DC | PRN
Start: 2021-11-10 — End: 2021-11-12

## 2021-11-10 MED ORDER — HEPARIN (PORCINE) IN NACL 1000-0.9 UT/500ML-% IV SOLN
INTRAVENOUS | Status: DC | PRN
  Administered 2021-11-10 (×2): 500 mL

## 2021-11-10 MED ORDER — IOHEXOL 350 MG/ML SOLN
INTRAVENOUS | Status: DC | PRN
  Administered 2021-11-10: 90 mL

## 2021-11-10 MED ORDER — SODIUM CHLORIDE 0.9% FLUSH
3.0000 mL | Freq: Two times a day (BID) | INTRAVENOUS | Status: DC
  Administered 2021-11-10 – 2021-11-11 (×2): 3 mL via INTRAVENOUS

## 2021-11-10 MED ORDER — MIDAZOLAM HCL 2 MG/2ML IJ SOLN
INTRAMUSCULAR | Status: DC | PRN
  Administered 2021-11-10: 2 mg via INTRAVENOUS

## 2021-11-10 MED ORDER — ASPIRIN EC 81 MG PO TBEC
81.0000 mg | DELAYED_RELEASE_TABLET | Freq: Every day | ORAL | Status: DC
  Administered 2021-11-11 – 2021-11-12 (×2): 81 mg via ORAL
  Filled 2021-11-10 (×3): qty 1

## 2021-11-10 MED ORDER — LIDOCAINE HCL (PF) 1 % IJ SOLN
INTRAMUSCULAR | Status: DC | PRN
  Administered 2021-11-10: 10 mL

## 2021-11-10 MED ORDER — LATANOPROST 0.005 % OP SOLN
1.0000 [drp] | Freq: Every day | OPHTHALMIC | Status: DC
  Filled 2021-11-10: qty 2.5

## 2021-11-10 MED ORDER — ASPIRIN 81 MG PO CHEW
81.0000 mg | CHEWABLE_TABLET | ORAL | Status: DC
Start: 2021-11-11 — End: 2021-11-10

## 2021-11-10 MED ORDER — LABETALOL HCL 5 MG/ML IV SOLN: 10.0000 mg | INTRAVENOUS | Status: AC | PRN

## 2021-11-10 MED ORDER — HEPARIN (PORCINE) IN NACL 1000-0.9 UT/500ML-% IV SOLN
INTRAVENOUS | Status: AC
  Filled 2021-11-10: qty 1000

## 2021-11-10 MED ORDER — MIDAZOLAM HCL 2 MG/2ML IJ SOLN
INTRAMUSCULAR | Status: AC
  Filled 2021-11-10: qty 2

## 2021-11-10 MED ORDER — FENTANYL CITRATE (PF) 100 MCG/2ML IJ SOLN
INTRAMUSCULAR | Status: DC | PRN
Start: 2021-11-10 — End: 2021-11-10
  Administered 2021-11-10: 25 ug via INTRAVENOUS

## 2021-11-10 MED ORDER — LIDOCAINE HCL (PF) 1 % IJ SOLN
INTRAMUSCULAR | Status: AC
  Filled 2021-11-10: qty 30

## 2021-11-10 MED ORDER — RANOLAZINE ER 500 MG PO TB12
500.0000 mg | ORAL_TABLET | Freq: Two times a day (BID) | ORAL | 3 refills | Status: DC
Start: 2021-11-10 — End: 2021-11-11

## 2021-11-10 MED ORDER — MORPHINE SULFATE (PF) 2 MG/ML IV SOLN
1.0000 mg | INTRAVENOUS | Status: DC | PRN
  Administered 2021-11-10: 1 mg via INTRAVENOUS
  Filled 2021-11-10: qty 1

## 2021-11-10 MED ORDER — SODIUM CHLORIDE 0.9% FLUSH
3.0000 mL | INTRAVENOUS | Status: DC | PRN
Start: 2021-11-10 — End: 2021-11-10

## 2021-11-10 MED ORDER — AMLODIPINE BESYLATE 5 MG PO TABS
5.0000 mg | ORAL_TABLET | Freq: Every day | ORAL | Status: DC
  Administered 2021-11-11 – 2021-11-12 (×2): 5 mg via ORAL
  Filled 2021-11-10 (×3): qty 1

## 2021-11-10 MED ORDER — SODIUM CHLORIDE 0.9 % IV SOLN
250.0000 mL | INTRAVENOUS | Status: DC | PRN
Start: 2021-11-10 — End: 2021-11-10

## 2021-11-10 MED ORDER — ISOSORBIDE MONONITRATE ER 60 MG PO TB24
60.0000 mg | ORAL_TABLET | Freq: Every day | ORAL | Status: DC
  Administered 2021-11-10 – 2021-11-12 (×3): 60 mg via ORAL
  Filled 2021-11-10 (×4): qty 1

## 2021-11-10 MED ORDER — MORPHINE SULFATE (PF) 2 MG/ML IV SOLN
2.0000 mg | INTRAVENOUS | Status: DC | PRN
  Administered 2021-11-10 – 2021-11-11 (×4): 2 mg via INTRAVENOUS
  Filled 2021-11-10 (×4): qty 1

## 2021-11-10 MED ORDER — HEPARIN SODIUM (PORCINE) 1000 UNIT/ML IJ SOLN
INTRAMUSCULAR | Status: DC | PRN
  Administered 2021-11-10: 7000 [IU] via INTRAVENOUS
  Administered 2021-11-10: 3000 [IU] via INTRAVENOUS

## 2021-11-10 MED ORDER — ISOSORBIDE MONONITRATE ER 60 MG PO TB24: 60.0000 mg | ORAL_TABLET | Freq: Every evening | ORAL | 3 refills | Status: DC

## 2021-11-10 SURGICAL SUPPLY — 12 items
CATH INFINITI 5FR MULTPACK ANG (CATHETERS) ×1 IMPLANT
CATH LAUNCHER 5F EBU3.5 (CATHETERS) ×1 IMPLANT
CLOSURE MYNX CONTROL 5F (Vascular Products) ×1 IMPLANT
GUIDEWIRE PRESSURE X 175 (WIRE) ×1 IMPLANT
KIT ESSENTIALS PG (KITS) ×1 IMPLANT
KIT HEART LEFT (KITS) ×3 IMPLANT
PACK CARDIAC CATHETERIZATION (CUSTOM PROCEDURE TRAY) ×3 IMPLANT
SHEATH PINNACLE 5F 10CM (SHEATH) ×1 IMPLANT
SHEATH PROBE COVER 6X72 (BAG) ×1 IMPLANT
TRANSDUCER W/STOPCOCK (MISCELLANEOUS) ×3 IMPLANT
TUBING CIL FLEX 10 FLL-RA (TUBING) ×3 IMPLANT
WIRE EMERALD 3MM-J .035X150CM (WIRE) ×1 IMPLANT

## 2021-11-10 NOTE — Progress Notes (Signed)
Writer ambulated patient after bedrest was completed to the restroom. Writer rechecked patients right groin and noticed a hematoma above the procedure site. Writer placed patient back on stretcher and applied pressure to right groin for 15 minutes. Hematoma appeared to resolve. On call attending MD [facility] paged to be notified. Awaiting further orders.  ?

## 2021-11-10 NOTE — Progress Notes (Signed)
Spoke to Dr. Marlou Porch, on call attending for Dr. Ellyn Hack. Informed hims of patients hematoma post ambulation. Per Dr. Ellyn Hack, he wants to admit patient for further observation. Orders are being placed for bed placement to [place] room 25. Writer awaiting a call back from [employer] to give hand off report to.  ?

## 2021-11-10 NOTE — Interval H&P Note (Signed)
History and Physical Interval Note: ? ?11/10/2021 ?4:14 PM ? ?Luis Guzman  has presented today for surgery, with the diagnosis of progressive angina.  The various methods of treatment have been discussed with the patient and family. After consideration of risks, benefits and other options for treatment, the patient has consented to  Procedure(s): ?LEFT HEART CATH AND CORONARY ANGIOGRAPHY (N/A)  ?PERCUTANEOUS CORONARY INTERVENTION ? ?as a surgical intervention.  The patient's history has been reviewed, patient examined, no change in status, stable for surgery.  I have reviewed the patient's chart and labs.  Questions were answered to the patient's satisfaction.   ? ?Cath Lab Visit (complete for each Cath Lab visit) ? ?Clinical Evaluation Leading to the Procedure:  ? ?ACS: No. ? ?Non-ACS:   ? ?Anginal Classification: CCS III ? ?Anti-ischemic medical therapy: Maximal Therapy (2 or more classes of medications) ? ?Non-Invasive Test Results: Equivocal test results - read as Low Risk but progressive Sx. ? ?Prior CABG: No previous CABG ? ? ?Glenetta Hew ? ? ?

## 2021-11-10 NOTE — Progress Notes (Signed)
? ?  Call from short stay, after ambulation, rechecked right groin and hematoma began to develop.  Pressure was then reapplied for approximately 20 minutes.  Hematoma currently under control. ? ?I discussed case with Dr. Ellyn Hack.  He placed a Mynx closure device.  With hematoma development, we will place in observation.  Dr. Ellyn Hack would like him to also be on Ranexa. ? ?Continue close surveillance. ? ?Candee Furbish, MD ? ?

## 2021-11-11 ENCOUNTER — Telehealth: Payer: Self-pay

## 2021-11-11 ENCOUNTER — Observation Stay (HOSPITAL_BASED_OUTPATIENT_CLINIC_OR_DEPARTMENT_OTHER): Payer: Medicare Other

## 2021-11-11 ENCOUNTER — Encounter (HOSPITAL_COMMUNITY): Payer: Self-pay | Admitting: Cardiology

## 2021-11-11 DIAGNOSIS — R1031 Right lower quadrant pain: Secondary | ICD-10-CM

## 2021-11-11 DIAGNOSIS — I25119 Atherosclerotic heart disease of native coronary artery with unspecified angina pectoris: Secondary | ICD-10-CM | POA: Diagnosis not present

## 2021-11-11 DIAGNOSIS — I200 Unstable angina: Secondary | ICD-10-CM | POA: Diagnosis not present

## 2021-11-11 LAB — BASIC METABOLIC PANEL
Anion gap: 9 (ref 5–15)
BUN: 10 mg/dL (ref 8–23)
CO2: 21 mmol/L — ABNORMAL LOW (ref 22–32)
Calcium: 8.3 mg/dL — ABNORMAL LOW (ref 8.9–10.3)
Chloride: 109 mmol/L (ref 98–111)
Creatinine, Ser: 0.75 mg/dL (ref 0.61–1.24)
GFR, Estimated: >60 mL/min (ref >60)
Glucose, Bld: 113 mg/dL — ABNORMAL HIGH (ref 70–99)
Potassium: 3.1 mmol/L — ABNORMAL LOW (ref 3.5–5.1)
Sodium: 139 mmol/L (ref 135–145)

## 2021-11-11 LAB — CBC
HCT: 38.7 % — ABNORMAL LOW (ref 39.0–52.0)
Hemoglobin: 13.7 g/dL (ref 13.0–17.0)
MCH: 31.8 pg (ref 26.0–34.0)
MCHC: 35.4 g/dL (ref 30.0–36.0)
MCV: 89.8 fL (ref 80.0–100.0)
Platelets: 175 10*3/uL (ref 150–400)
RBC: 4.31 MIL/uL (ref 4.22–5.81)
RDW: 12.6 % (ref 11.5–15.5)
WBC: 10.8 10*3/uL — ABNORMAL HIGH (ref 4.0–10.5)
nRBC: 0 % (ref 0.0–0.2)

## 2021-11-11 MED ORDER — POTASSIUM CHLORIDE CRYS ER 20 MEQ PO TBCR: EXTENDED_RELEASE_TABLET | ORAL | 1 refills | Status: DC

## 2021-11-11 MED ORDER — RANOLAZINE ER 500 MG PO TB12: 500.0000 mg | ORAL_TABLET | Freq: Two times a day (BID) | ORAL | 3 refills | Status: DC

## 2021-11-11 MED ORDER — ISOSORBIDE MONONITRATE ER 60 MG PO TB24
60.0000 mg | ORAL_TABLET | Freq: Every day | ORAL | 3 refills | Status: DC
Start: 2021-11-11 — End: 2021-11-24

## 2021-11-11 MED ORDER — FUROSEMIDE 20 MG PO TABS: ORAL_TABLET | ORAL | 3 refills | Status: DC

## 2021-11-11 MED ORDER — POTASSIUM CHLORIDE CRYS ER 20 MEQ PO TBCR
40.0000 meq | EXTENDED_RELEASE_TABLET | Freq: Once | ORAL | Status: AC
  Administered 2021-11-11: 40 meq via ORAL
  Filled 2021-11-11: qty 2

## 2021-11-11 NOTE — Plan of Care (Signed)
  Problem: Health Behavior/Discharge Planning: Goal: Ability to manage health-related needs will improve Outcome: Progressing   

## 2021-11-11 NOTE — Progress Notes (Signed)
? ? ?  Asked to check on patient's right groin.  He had a hematoma after getting up and walking.  At the time I saw him, he was comfortable and laying flat.  2+ right femoral pulse palpable.  There was some mild swelling above and medial to the actual puncture site.  The area was soft.  There was bruising that was beginning to spread towards the midline and out laterally.  We decided to just keep him in bed until 4:30 PM and then allow him to get up. ? ?I went back to check on him at about 330.  The right groin area was again more firm and swollen.  Manual compression was held.  It was possible to push the hematoma towards the puncture site and soften the area above his groin crease.  We decided to place a FemoStop at low pressure to see if this would help seal what ever may be leaking.   ? ?He has morphine written for as needed.  Would use this at the time of FemoStop placement.  Discussed with Dr. Harrington Challenger. ? ?Jettie Booze, MD ? ?

## 2021-11-11 NOTE — Progress Notes (Signed)
After ambulation, pt had some swelling in the groin area. Pressure was held by RN for ~ 10 min. Groin now soft, but appreciate some swelling. Reviewed and examined by Dr. Irish Lack. Will complete another 2 hrs of bedrest and then try to ambulate again ~4pm. ? ? ? ?Ledora Bottcher, PA-C ?11/11/2021, 2:28 PM ?[phone] ?[place] ? ?

## 2021-11-11 NOTE — Discharge Summary (Signed)
?Discharge Summary  ?  ?Patient ID: Luis Guzman ?MRN: [id number]; DOB: 01-06-1946 ? ?Admit date: 11/10/2021 ?Discharge date: 11/12/2021 ? ?PCP:  Luis Post, MD ?  ?[facility] Providers ?Cardiologist:  Luis Hew, MD  ? ?Discharge Diagnoses  ?  ?Principal Problem: ?  Progressive angina ([place]) ?Active Problems: ?  CAD S/P & DES PCI -ostial PDA and D1 ?  Coronary artery disease involving native coronary artery of native heart with angina pectoris ([place]) ?  CAD (coronary artery disease), native coronary artery ? ? ? ?Diagnostic Studies/Procedures  ?  ?Left heart cath 11/10/21 ?  Mid Cx lesion is 55% stenosed.-RFR 0.95 ?  Prox RCA lesion is 40% stenosed. Mid RCA lesion is 55% stenosed.  Stable ?  1st Diag stent was widely patent ?  RPDA stent was widely patent ?  The left ventricular systolic function is normal. ?  LV end diastolic pressure is mildly elevated. ?  There is no aortic valve stenosis. ?  ?SUMMARY ?Widely patent stents in the diagonal and PDA.  ?RFR negative lesion in the LCx ?Otherwise moderate mid RCA disease. ?No culprit lesion to explain the patient's symptoms.  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 ?  ?  ?PLAN: ?Discharge home today ?Increase Imdur dosing to 60 mg daily (take additional dose as needed when sublingual nitroglycerin used. ?Take Lasix 3 days a week and as needed for chest pain or shortness of breath ?Add Ranexa 500 mg twice daily ?Follow-up-originally scheduled for May 23, will try to get it earlier. ?_____________ ?  ?VAS [place] LE arterial PSA ?Findings:  ?A mixed echogenic structure measuring approximately 4.9 cm x 2.4 cm is  ?visualized at the RT groin with ultrasound characteristics of a hematoma.    ? ?Summary: No evidence of pseudoaneurysm, AVF or DVT  ? ? ?_____________ ? ?Echo 10/18/21: ? 1. Left ventricular ejection fraction by 3D volume is 67 %. The left  ?ventricle has normal function. The left ventricle has no regional wall  ?motion abnormalities. There is mild concentric left  ventricular  ?hypertrophy. Left ventricular diastolic  ?parameters are consistent with Grade I diastolic dysfunction (impaired  ?relaxation).  ? 2. Right ventricular systolic function is normal. The right ventricular  ?size is normal.  ? 3. The mitral valve is normal in structure. Mild mitral valve  ?regurgitation. No evidence of mitral stenosis.  ? 4. The aortic valve is normal in structure. Aortic valve regurgitation is  ?mild to moderate. No aortic stenosis is present.  ? 5. There is borderline dilatation of the aortic root, measuring 37 mm.  ? 6. The inferior vena cava is normal in size with greater than 50%  ?respiratory variability, suggesting right atrial pressure of 3 mmHg.  ? ?History of Present Illness   ?  ?Luis Guzman is a 76 y.o. male with a hx of CAD, AAA, hypertension, hyperlipidemia and DM2.  Previous cardiac catheterization in July 2020 demonstrated 65% left circumflex lesion with negative FFR, 85% D1 and a 90% PDA.  Patient underwent angioplasty of D1 and DES to PDA vessel.  He had repeat scoring balloon angioplasty of D1 in November 2020.  Echocardiogram in February 2022 showed normal EF, mild LVH, mild aortic insufficiency.  He contacted our office in February with complaint of chest tightness and increased lower extremity edema, weight gain and dyspnea.  He was started on 20 mg Lasix for 2 days before changed to as needed.  Amlodipine was decreased to 5 mg daily due to lower extremity edema.  Patient did not appear to be volume overloaded by physical exam.  Echocardiogram obtained on 10/18/2021 demonstrated EF 67%, grade 1 DD, mild MR, mild to moderate AI.  Subsequent Myoview demonstrated 1 mm horizontal ST depression in V4-V5, EF 58%, frequent exercise induced PVCs including triplet, inferior perfusion defect that improved with stress consistent with artifact.  Although EKG portion was positive, however stress test was found to be negative by perfusion imaging.  Overall moderate risk study. ? ?He  was seen back in clinic with [facility] Gu[facility] 11/05/21 with intermittent chest pressure. Case discussed with Dr. Ellyn Guzman and he was scheduled for outpatient cardiac catheterization.  ? ?Hospital Course  ?   ?Consultants: none ? ?Chest pain ?CAD ?Pt underwent scheduled heart catheterization 11/10/21 that showed mid Cx lesion of 55% with RFR 0.95, stable RCA lesion, widely patent D1 and RPDA stents. No culprit lesion was found and titration of medical therapy was recommended. Dr. Ellyn Guzman did mention microvascular disease. Imdur was increased to 60 mg daily and ranexa 500 mg BID was added to his regimen.  ? ?LVEDP mildly elevated and lasix three times weekly as needed for shortness of breath or chest pain was recommended.  ? ? ?Right groin hematoma ?Groin hematoma was noted in short stay and he was admitted overnight for observation. Mynx in place. Hematoma noted on exam 3./30/23 and I ordered an [place] to rule out PSA. Groin US performed 11/11/21 showed hematoma but no PSA. Following these results, he was able to ambulate in the hall, but noted an increase in the size of hematoma and more firmness proximal to groin stick. Pressure was held by RN for 10 min with softening of groin. He remained on bed rest for another 3 hrs. Upon ambulation again, repeat swelling and enlargement in the groin area. Dr. Irish Guzman examined and recommended overnight stay. Repeat US this afternoon 11/12/21 again negative for PSA. He was deemed stable for discharge. Wife, who used to work in [employer] stay, aware of return precautions. ? ? ?Hypertension ?No change in therapy. ? ? ?Hyperlipidemia  ?Continue praluent ? ? ?Pt seen and examined by Dr. Harrington Guzman and felt stable for discharge. Cardiology follow up has been arranged. ? ? ? ?Did the patient have an acute coronary syndrome (MI, NSTEMI, STEMI, etc) this admission?:  No                               ?Did the patient have a percutaneous coronary intervention (stent / angioplasty)?:  No.   ? ?   ?The patient  will be scheduled for a TOC follow up appointment in 7-14 days.  A message has been sent to the [facility] and Scheduling Pool at the office where the patient should be seen for follow up.  ?_____________ ? ?Discharge Vitals ?Blood pressure 139/69, pulse 68, temperature 98.2 ?F (36.8 ?C), temperature source Oral, resp. rate 13, height '6\' 1"'$  (1.854 m), weight 84.8 kg, SpO2 95 %.  ?Filed Weights  ? 11/10/21 1102  ?Weight: 84.8 kg  ? ? ?Labs & Radiologic Studies  ?  ?CBC ?Recent Labs  ?  11/11/21 ?0229 11/12/21 ?0721  ?WBC 10.8* 9.3  ?HGB 13.7 13.7  ?HCT 38.7* 38.8*  ?MCV 89.8 90.2  ?PLT 175 174  ? ?Basic Metabolic Panel ?Recent Labs  ?  11/11/21 ?0229 11/12/21 ?0721  ?NA 139 138  ?K 3.1* 3.8  ?CL 109 108  ?CO2 21* 22  ?GLUCOSE 113*  110*  ?BUN 10 12  ?CREATININE 0.75 0.96  ?CALCIUM 8.3* 8.7*  ? ?Liver Function Tests ?No results for input(s): AST, ALT, ALKPHOS, BILITOT, PROT, ALBUMIN in the last 72 hours. ?No results for input(s): LIPASE, AMYLASE in the last 72 hours. ?High Sensitivity Troponin:   ?No results for input(s): TROPONINIHS in the last 720 hours.  ?BNP ?Invalid input(s): POCBNP ?D-Dimer ?No results for input(s): DDIMER in the last 72 hours. ?Hemoglobin A1C ?No results for input(s): HGBA1C in the last 72 hours. ?Fasting Lipid Panel ?No results for input(s): CHOL, HDL, LDLCALC, TRIG, CHOLHDL, LDLDIRECT in the last 72 hours. ?Thyroid Function Tests ?No results for input(s): TSH, T4TOTAL, T3FREE, THYROIDAB in the last 72 hours. ? ?Invalid input(s): FREET3 ?_____________  ?CARDIAC CATHETERIZATION ? ?Result Date: 11/10/2021 ?  Mid Cx lesion is 55% stenosed.-RFR 0.95   Prox RCA lesion is 40% stenosed. Mid RCA lesion is 55% stenosed.  Stable   1st Diag stent was widely patent   RPDA stent was widely patent   The left ventricular systolic function is normal.   LV end diastolic pressure is mildly elevated.   There is no aortic valve stenosis. SUMMARY Widely patent stents in the diagonal and PDA. RFR negative lesion in the  LCx Otherwise moderate mid RCA disease. No culprit lesion to explain the patient's symptoms.  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 PLAN: Discharge home today Increase Imdur dosing to 60 mg daily (take addit

## 2021-11-11 NOTE — Progress Notes (Signed)
Pain in right groin. Increase swelling. Elevated b/p. Patient c/o pain going to knee. Notified on call Dr. Kalman Shan.  ? ?Came to bedside. B/p meds given with additional pain meds given. ? ?Will continue to monitor ?

## 2021-11-11 NOTE — Progress Notes (Signed)
? ?Progress Note ? ?Patient Name: Luis Guzman ?Date of Encounter: 11/11/2021 ? ?[facility] Cardiologist: Glenetta Hew, MD  ? ?Subjective  ? ?No CP   No SOB   R Groin still sore  ? ?Inpatient Medications  ?  ?Scheduled Meds: ? amLODipine  5 mg Oral Daily  ? aspirin EC  81 mg Oral Daily  ? heparin  5,000 Units Subcutaneous Q8H  ? isosorbide mononitrate  60 mg Oral Daily  ? latanoprost  1 drop Both Eyes QHS  ? metoprolol succinate  12.5 mg Oral Daily  ? ranolazine  500 mg Oral BID  ? sodium chloride flush  3 mL Intravenous Q12H  ? traZODone  50 mg Oral QHS  ? ?Continuous Infusions: ? sodium chloride Stopped (11/10/21 2244)  ? ?PRN Meds: ?sodium chloride, acetaminophen, acetaminophen, acetaminophen, morphine injection, nitroGLYCERIN, ondansetron (ZOFRAN) IV, ondansetron (ZOFRAN) IV, sodium chloride flush  ? ?Vital Signs  ?  ?Vitals:  ? 11/10/21 2300 11/11/21 0001 11/11/21 0343 11/11/21 0914  ?BP: 132/70 133/69 130/62 132/72  ?Pulse: 68 67 66 72  ?Resp: '14 17 17 19  '$ ?Temp:  98.1 ?F (36.7 ?C) 97.6 ?F (36.4 ?C) 98 ?F (36.7 ?C)  ?TempSrc:  Oral Oral Oral  ?SpO2: 96% 96% 95% 98%  ?Weight:      ?Height:      ? ? ?Intake/Output Summary (Last 24 hours) at 11/11/2021 1000 ?Last data filed at 11/11/2021 0938 ?Gross per 24 hour  ?Intake 240.8 ml  ?Output 300 ml  ?Net -59.2 ml  ? ? ?  11/10/2021  ? 11:02 AM 11/05/2021  ?  2:33 PM 10/20/2021  ?  8:12 AM  ?Last 3 Weights  ?Weight (lbs) 187 lb 192 lb 3.2 oz 195 lb  ?Weight (kg) 84.823 kg 87.181 kg 88.451 kg  ?   ? ?Telemetry  ?  ?Sr  - Personally Reviewed ? ?ECG  ?  ? - Personally Reviewed ? ?Physical Exam  ? ?GEN: No acute distress.   ?Neck: No JVD ?Cardiac: RRR, no murmurs, rubs, or gallops.  ?Respiratory: Clear to auscultation bilaterally. ?GI: Soft, nontender, non-distended  ?MS: No edema;  R groin with swelling    No bruit   Soft   ?Neuro:  Nonfocal  ?Psych: Normal affect  ? ?Labs  ?  ?High Sensitivity Troponin:  No results for input(s): TROPONINIHS in the last 720 hours.    ?Chemistry ?Recent Labs  ?Lab 11/05/21 ?1534 11/10/21 ?2052 11/11/21 ?0229  ?NA 141  --  139  ?K 3.9  --  3.1*  ?CL 103  --  109  ?CO2 20  --  21*  ?GLUCOSE 89  --  113*  ?BUN 14  --  10  ?CREATININE 1.08 0.84 0.75  ?CALCIUM 9.5  --  8.3*  ?GFRNONAA  --  >60 >60  ?ANIONGAP  --   --  9  ?  ?Lipids No results for input(s): CHOL, TRIG, HDL, LABVLDL, LDLCALC, CHOLHDL in the last 168 hours.  ?Hematology ?Recent Labs  ?Lab 11/05/21 ?1534 11/10/21 ?2052 11/11/21 ?0229  ?WBC 9.7 8.1 10.8*  ?RBC 5.09 4.53 4.31  ?HGB 15.7 14.2 13.7  ?HCT 46.1 40.6 38.7*  ?MCV 91 89.6 89.8  ?MCH 30.8 31.3 31.8  ?MCHC 34.1 35.0 35.4  ?RDW 12.2 12.7 12.6  ?PLT 231 179 175  ? ?Thyroid No results for input(s): TSH, FREET4 in the last 168 hours.  ?BNPNo results for input(s): BNP, PROBNP in the last 168 hours.  ?DDimer No results for input(s):  DDIMER in the last 168 hours.  ? ?Radiology  ?  ?CARDIAC CATHETERIZATION ? ?Result Date: 11/10/2021 ?  Mid Cx lesion is 55% stenosed.-RFR 0.95   Prox RCA lesion is 40% stenosed. Mid RCA lesion is 55% stenosed.  Stable   1st Diag stent was widely patent   RPDA stent was widely patent   The left ventricular systolic function is normal.   LV end diastolic pressure is mildly elevated.   There is no aortic valve stenosis. SUMMARY Widely patent stents in the diagonal and PDA. RFR negative lesion in the LCx Otherwise moderate mid RCA disease. No culprit lesion to explain the patient's symptoms.  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 PLAN: Discharge home today Increase Imdur dosing to 60 mg daily (take additional dose as needed when sublingual nitroglycerin used. Take Lasix 3 days a week and as needed for chest pain or shortness of breath Add Ranexa 500 mg twice daily Follow-up-originally scheduled for May 23, will try to get it earlier. Glenetta Hew, MD  ? ?Cardiac Studies  ? ? ? ?Patient Profile  ?   ?76 y.o. male  with known CAD   Now s/p L heart cath       ? ?Assessment & Plan  ?  ?1  CAD  Pt had myoview that was  normal perfusion but some EKG changes   Based on this and continued symptoms set up for LHC   This was done yesterday   Stents were patent   Moderate dz in M RCA  No culprit   ? Microvascular dz. ? ?Recomm Imdur 60, Lasi 3x per week   Ranexa 500 bid     ?F/U in clinc  ? ?Post procedure had hematoma    Soft today but will get USN to confirm no vascular problems ? ?2  HTN  Controlled  ? ?3  HL    On Praluent ? ?4  Diet   Low sugar    Less than 36 g per day  ? ?For questions or updates, please contact [place] ?Please consult [email or web address] for contact info under  ? ?  ?   ?Signed, ?Ledora Bottcher, PA  ?11/11/2021, 10:00 AM   ? ?

## 2021-11-11 NOTE — Progress Notes (Addendum)
Patient's right groin hematoma seems to be decreasing. Pain is lower than it has been all night.  ? ?Wife at bedside. Helps with assessing groin site with this RN. ? ?Will continue to monitor ?

## 2021-11-11 NOTE — TOC Transition Note (Signed)
Transition of Care (TOC) - CM/SW Discharge Note ?Marvetta Gibbons Therapist, sports, BSN ?Transitions of Care ?Unit 4E- RN Case Manager ?See Treatment Team for direct phone #  ? ? ?Patient Details  ?Name: CAVEN PERINE ?MRN: [id number] ?Date of Birth: [birth year] ? ?Transition of Care (TOC) CM/SW Contact:  ?Dahlia Client, Romeo Rabon, RN ?Phone Number: ?11/11/2021, 11:50 AM ? ? ?Clinical Narrative:    ?Pt stable for transition home today, Transition of Care Department [facility]) has reviewed patient and no TOC needs have been identified at this time.  ? ? ?Final next level of care: Home/Self Care ?Barriers to Discharge: No Barriers Identified ? ? ?Patient Goals and CMS Choice ?  ?  ?Choice offered to / list presented to : NA ? ?Discharge Placement ?  ?           ? Home ?  ?  ?  ? ?Discharge Plan and Services ?  ?  ?           ?DME Arranged: N/A ?DME Agency: NA ?  ?  ?  ?HH Arranged: NA ?[place] Agency: NA ?  ?  ?  ? ?Social Determinants of Health (SDOH) Interventions ?  ? ? ?Readmission Risk Interventions ?   ? View : No data to display.  ?  ?  ?  ? ? ? ? ? ?

## 2021-11-11 NOTE — Telephone Encounter (Signed)
-----   Message from Ledora Bottcher, [place] sent at 11/11/2021  1:03 PM EDT ----- ?Pt will require a TOC phone call. Discharging this afternoon.  ? ?Thank you ?Angie ? ? ?

## 2021-11-11 NOTE — Telephone Encounter (Signed)
Call Friday PM ?

## 2021-11-11 NOTE — Progress Notes (Signed)
RLE pseudoaneurysm study completed.  Preliminary results given to [facility], RN at 1200. ? ? ?Results can be found under chart review under CV PROC. ?11/11/2021 3:22 PM ?Alick Lecomte RVT, RDMS ? ?

## 2021-11-11 NOTE — Progress Notes (Signed)
Patient c/o pain increasing to 8. Upon assessment, increase swelling right groin. Pressure held for 15 minutes. Pain medicine given after pressure held.  ? ?Notified on call Dr. Kalman Shan. Will continue to monitor ?

## 2021-11-11 NOTE — Progress Notes (Signed)
Called to apply femostop by Dr.Varanasi. Right femoral artery site presents with ecchymosis, and bruising. Slight, non hard hematoma present about 3 inches around stick site. Manual pressure applied for 5 minutes prior to applying femostop. ? ?Femostop applied at 9mHG. Dp and PT pulses present by palpation and doppler.  ? ? ?Femstop applied at 16:15:00 ?

## 2021-11-12 ENCOUNTER — Encounter (HOSPITAL_COMMUNITY): Payer: Self-pay | Admitting: Cardiology

## 2021-11-12 ENCOUNTER — Ambulatory Visit (HOSPITAL_COMMUNITY): Payer: Medicare Other

## 2021-11-12 DIAGNOSIS — R10813 Right lower quadrant abdominal tenderness: Secondary | ICD-10-CM | POA: Diagnosis not present

## 2021-11-12 DIAGNOSIS — I25119 Atherosclerotic heart disease of native coronary artery with unspecified angina pectoris: Secondary | ICD-10-CM | POA: Diagnosis not present

## 2021-11-12 DIAGNOSIS — I200 Unstable angina: Secondary | ICD-10-CM | POA: Diagnosis not present

## 2021-11-12 LAB — BASIC METABOLIC PANEL
Anion gap: 8 (ref 5–15)
BUN: 12 mg/dL (ref 8–23)
CO2: 22 mmol/L (ref 22–32)
Calcium: 8.7 mg/dL — ABNORMAL LOW (ref 8.9–10.3)
Chloride: 108 mmol/L (ref 98–111)
Creatinine, Ser: 0.96 mg/dL (ref 0.61–1.24)
GFR, Estimated: >60 mL/min (ref >60)
Glucose, Bld: 110 mg/dL — ABNORMAL HIGH (ref 70–99)
Potassium: 3.8 mmol/L (ref 3.5–5.1)
Sodium: 138 mmol/L (ref 135–145)

## 2021-11-12 LAB — CBC
HCT: 38.8 % — ABNORMAL LOW (ref 39.0–52.0)
Hemoglobin: 13.7 g/dL (ref 13.0–17.0)
MCH: 31.9 pg (ref 26.0–34.0)
MCHC: 35.3 g/dL (ref 30.0–36.0)
MCV: 90.2 fL (ref 80.0–100.0)
Platelets: 174 10*3/uL (ref 150–400)
RBC: 4.3 MIL/uL (ref 4.22–5.81)
RDW: 12.9 % (ref 11.5–15.5)
WBC: 9.3 10*3/uL (ref 4.0–10.5)
nRBC: 0 % (ref 0.0–0.2)

## 2021-11-12 NOTE — Progress Notes (Signed)
Called by nurse for concern of groin swelling. Per her report, patient assessed by fellow overnight and groin had been soft but now she appreciates a small area of firmness. Patient seen at bedside, denies any pain or discomfort, feeling well otherwise, VSS. Right groin is largely soft and not exquisitely tender; there is mild generalized swelling as well as a mild area of firmness more midline but no overt bulge or hard knots observed. Generalized ecchymosis noted to cath area and wrapping around. Will order CBC and BMET. Keep on bedrest for now. Will alert routing team/Dr. Harrington Challenger to assess this AM since they can comparatively eval to appearance yesterday. Will keep NPO in case of any further interventions needed and cancel DC order pending MD eval. ?

## 2021-11-12 NOTE — Progress Notes (Signed)
? ?Progress Note ? ?Patient Name: Luis Guzman ?Date of Encounter: 11/12/2021 ? ?[facility] Cardiologist: Glenetta Hew, MD  ? ?Subjective  ? ?Pt denies CP  No SOB   R groin   ? ?Inpatient Medications  ?  ?Scheduled Meds: ? amLODipine  5 mg Oral Daily  ? aspirin EC  81 mg Oral Daily  ? heparin  5,000 Units Subcutaneous Q8H  ? isosorbide mononitrate  60 mg Oral Daily  ? latanoprost  1 drop Both Eyes QHS  ? metoprolol succinate  12.5 mg Oral Daily  ? ranolazine  500 mg Oral BID  ? sodium chloride flush  3 mL Intravenous Q12H  ? traZODone  50 mg Oral QHS  ? ?Continuous Infusions: ? sodium chloride Stopped (11/10/21 2244)  ? ?PRN Meds: ?sodium chloride, acetaminophen, morphine injection, nitroGLYCERIN, ondansetron (ZOFRAN) IV, sodium chloride flush  ? ?Vital Signs  ?  ?Vitals:  ? 11/12/21 0000 11/12/21 0200 11/12/21 0400 11/12/21 0800  ?BP: 115/63  107/62 (!) 152/72  ?Pulse: (!) 53 (!) 57 60 (!) 59  ?Resp: '17 11 12 16  '$ ?Temp:      ?TempSrc:      ?SpO2: 93% 94% 94% 95%  ?Weight:      ?Height:      ? ? ?Intake/Output Summary (Last 24 hours) at 11/12/2021 0855 ?Last data filed at 11/12/2021 0005 ?Gross per 24 hour  ?Intake 480 ml  ?Output --  ?Net 480 ml  ? ? ?  11/10/2021  ? 11:02 AM 11/05/2021  ?  2:33 PM 10/20/2021  ?  8:12 AM  ?Last 3 Weights  ?Weight (lbs) 187 lb 192 lb 3.2 oz 195 lb  ?Weight (kg) 84.823 kg 87.181 kg 88.451 kg  ?   ? ?Telemetry  ?  ?SR - Personally Reviewed ? ?ECG  ?  ? - Personally Reviewed ? ?Physical Exam  ? ?GEN: No acute distress.   ?Neck: No JVD ?Cardiac: RRR, no murmurs ?Respiratory: Clear to auscultation bilaterally. ?GI: Soft, nontender, non-distended  ?MS: No edema;  R groin with swelling  is more diffuse   Firm laterally   Tender  ?Neuro:  Nonfocal  ?Psych: Normal affect  ? ?Labs  ?  ?High Sensitivity Troponin:  No results for input(s): TROPONINIHS in the last 720 hours.   ?Chemistry ?Recent Labs  ?Lab 11/05/21 ?1534 11/10/21 ?2052 11/11/21 ?6962 11/12/21 ?9528  ?NA 141  --  139 138  ?K 3.9   --  3.1* 3.8  ?CL 103  --  109 108  ?CO2 20  --  21* 22  ?GLUCOSE 89  --  113* 110*  ?BUN 14  --  10 12  ?CREATININE 1.08 0.84 0.75 0.96  ?CALCIUM 9.5  --  8.3* 8.7*  ?GFRNONAA  --  >60 >60 >60  ?ANIONGAP  --   --  9 8  ?  ?Lipids No results for input(s): CHOL, TRIG, HDL, LABVLDL, LDLCALC, CHOLHDL in the last 168 hours.  ?Hematology ?Recent Labs  ?Lab 11/10/21 ?2052 11/11/21 ?0229 11/12/21 ?4132  ?WBC 8.1 10.8* 9.3  ?RBC 4.53 4.31 4.30  ?HGB 14.2 13.7 13.7  ?HCT 40.6 38.7* 38.8*  ?MCV 89.6 89.8 90.2  ?MCH 31.3 31.8 31.9  ?MCHC 35.0 35.4 35.3  ?RDW 12.7 12.6 12.9  ?PLT 179 175 174  ? ?Thyroid No results for input(s): TSH, FREET4 in the last 168 hours.  ?BNPNo results for input(s): BNP, PROBNP in the last 168 hours.  ?DDimer No results for input(s): DDIMER in the  last 168 hours.  ? ?Radiology  ?  ?CARDIAC CATHETERIZATION ? ?Result Date: 11/10/2021 ?  Mid Cx lesion is 55% stenosed.-RFR 0.95   Prox RCA lesion is 40% stenosed. Mid RCA lesion is 55% stenosed.  Stable   1st Diag stent was widely patent   RPDA stent was widely patent   The left ventricular systolic function is normal.   LV end diastolic pressure is mildly elevated.   There is no aortic valve stenosis. SUMMARY Widely patent stents in the diagonal and PDA. RFR negative lesion in the LCx Otherwise moderate mid RCA disease. No culprit lesion to explain the patient's symptoms.  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 PLAN: Discharge home today Increase Imdur dosing to 60 mg daily (take additional dose as needed when sublingual nitroglycerin used. Take Lasix 3 days a week and as needed for chest pain or shortness of breath Add Ranexa 500 mg twice daily Follow-up-originally scheduled for May 23, will try to get it earlier. Glenetta Hew, MD ? ?VAS [place] GROIN PSEUDOANEURYSM ? ?Result Date: 11/11/2021 ? ARTERIAL PSEUDOANEURYSM  Patient Name:  Luis Guzman  Date of Exam:   11/11/2021 Medical Rec #: [id number]    Accession #:    [id number] Date of Birth: 1946/04/22    Patient Gender:  M Patient Age:   33 years Exam Location:  [facility] Procedure:      VAS [place] GROIN PSEUDOANEURYSM Referring Phys: Fabian Sharp --------------------------------------------------------------------------------  Exam: Right groin Indications: Patient complains of bruising. History: S/p catheterization. Performing Technologist: Rogelia Rohrer RVT, RDMS  Examination Guidelines: A complete evaluation includes B-mode imaging, spectral Doppler, color Doppler, and power Doppler as needed of all accessible portions of each vessel. Bilateral testing is considered an integral part of a complete examination. Limited examinations for reoccurring indications may be performed as noted. +------------+----------+---------+------+----------+ Right DuplexPSV (cm/s)Waveform PlaqueComment(s) +------------+----------+---------+------+----------+ Ext.Iliac      134    biphasic                  +------------+----------+---------+------+----------+ CFA             84    biphasic                  +------------+----------+---------+------+----------+ PFA             71    biphasic                  +------------+----------+---------+------+----------+ Prox SFA       113    triphasic                 +------------+----------+---------+------+----------+ Right Vein comments:CFV patent with phasic flow  Findings: A mixed echogenic structure measuring approximately 4.9 cm x 2.4 cm is visualized at the RT groin with ultrasound characteristics of a hematoma.  Summary: No evidence of pseudoaneurysm, AVF or DVT  Diagnosing physician: Jamelle Haring Electronically signed by Jamelle Haring on 11/11/2021 at 5:54:20 PM.    --------------------------------------------------------------------------------    Final    ? ?Cardiac Studies  ? ? ? ?Patient Profile  ?   ?76 y.o. male  with known CAD   Now s/p L heart cath       ? ?Assessment & Plan  ?  ?1  CAD  Pt had myoview that was normal perfusion but some EKG changes   Based on this  and continued symptoms set up for LHC   This was done yesterday   Stents were patent   Moderate dz in M RCA  No culprit   ? Microvascular dz. ?Recomm Imdur 60, Lasi 3x per week   Ranexa 500 bid     ?F/U in clinc  ?  ?2  R groin   Hematoma noted yesteday on USN  (4.9 x 2.4 cm)  No vascular abnormality   Pt had continued swelling (worsening last night)   had pressure held    This AM still tender, ? Larger    ?Exam   It is more prominent today    Sl hard   Ecchymotic      ?Will get USN again to document significnt changes     ?Possible d/c today  ? ?2  HTN  Controlled  ? ?3  HL    On Praluent ? ?4  Diet   Low sugar    Less than 36 g per day  ? ?For questions or updates, please contact [place] ?Please consult [email or web address] for contact info under  ? ?  ?   ?Signed, ?Dorris Carnes, MD  ?11/12/2021, 8:55 AM   ? ?

## 2021-11-12 NOTE — Plan of Care (Signed)
  Problem: Health Behavior/Discharge Planning: Goal: Ability to manage health-related needs will improve Outcome: Progressing   

## 2021-11-15 ENCOUNTER — Ambulatory Visit: Payer: Medicare Other | Admitting: Nurse Practitioner

## 2021-11-16 ENCOUNTER — Encounter: Payer: Self-pay | Admitting: Cardiology

## 2021-11-16 ENCOUNTER — Ambulatory Visit: Payer: Medicare Other | Admitting: Family Medicine

## 2021-11-18 ENCOUNTER — Other Ambulatory Visit: Payer: Self-pay

## 2021-11-18 MED ORDER — FUROSEMIDE 20 MG PO TABS
ORAL_TABLET | ORAL | 3 refills | Status: DC
Start: 2021-11-18 — End: 2021-11-24

## 2021-11-18 MED ORDER — POTASSIUM CHLORIDE CRYS ER 20 MEQ PO TBCR
EXTENDED_RELEASE_TABLET | ORAL | 1 refills | Status: DC
Start: 2021-11-18 — End: 2022-11-28

## 2021-11-18 NOTE — Telephone Encounter (Signed)
Patient contacted regarding discharge from [facility] on 03/31. ? ?Patient understands to follow up with provider Diona Browner, NP on 04/12 at 8:456 AM at [facility]. ?Patient understands discharge instructions? yes ?Patient understands medications and regiment? yes ?Patient understands to bring all medications to this visit? Yes ? ?Patient needed potassium sent to [place].  ? ? ? ?

## 2021-11-22 NOTE — Progress Notes (Signed)
? ? ?Office Visit  ?  ?Patient Name: Luis Guzman ?Date of Encounter: 11/24/2021 ? ?Primary Care Provider:  Eulas Post, MD ?Primary Cardiologist:  Glenetta Hew, MD ? ?Chief Complaint  ?  ?76 year old male with a history of CAD, borderline dilation of aortic root, hypertension, hyperlipidemia, and type 2 diabetes who presents for post-hospital follow-up related to CAD. ? ?Past Medical History  ?  ?Past Medical History:  ?Diagnosis Date  ? AAA (abdominal aortic aneurysm) ([place])   ? Anemia   ? as infant history of  ? CAD S/P PTCA & DES PCI - for Progressive Angina 02/26/2019  ? Cath-PCI 02/26/2019: EF 55-65%. mCx 65% (FFR 0.82 - Med Rx). D2 85% - Wolverine Scoring Balloon PTCA (2.0 mm). Ost rPDA - 90% -Resolute Onyx 2.5 x 15 (2.6 mm). --> 06/2019: staged DES PCI ost D1 (RESOLUTE ONYX 2.25 mm x 50 mm (2.6 mm) positioned to avoid the true ostium)  ? Elevated PSA   ? GERD (gastroesophageal reflux disease)   ? History of diabetes mellitus, type II   ? loss weight under control currently  ? History of hiatal hernia   ? 40 yrs ago  ? History of pancreatitis   ? elevated lipase levels   ? HYPERLIPIDEMIA 01/26/2009  ? HYPERTENSION 01/26/2009  ? 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 occ. lower back pain, s/p cervical neck surgery"stiffness" remains  ? Transfusion history   ? infant "anemia"  ? ?Past Surgical History:  ?Procedure Laterality Date  ? APPENDECTOMY  ~ 1959  ? BACK SURGERY    ? CERVICAL DISC ARTHROPLASTY N/A 07/30/2018  ? Procedure: Cervical five-six Cervical six-seven Artificial disc replacement;  Surgeon: Kristeen Miss, MD;  Location: [facility];  Service: Neurosurgery;  Laterality: N/A;  ? CERVICAL LAMINECTOMY  1997  ? C 4 and C 5  ? CHOLECYSTECTOMY N/A 04/15/2014  ? Procedure: LAPAROSCOPIC CHOLECYSTECTOMY WITH INTRAOPERATIVE CHOLANGIOGRAM;  Surgeon: Armandina Gemma, MD;  Location: [facility];  Service: General;  Laterality: N/A;  ? COLONOSCOPY    ? CORONARY BALLOON ANGIOPLASTY N/A 02/26/2019  ? Procedure:  CORONARY BALLOON ANGIOPLASTY;  Surgeon: Leonie Man, MD;  Location: [place] [facility];  Service: Cardiovascular -> scoring balloon PTCA (Wolverine 2.0 mm) of ostial D2 85% reducing to 20-30%.  ? CORONARY STENT INTERVENTION N/A 02/26/2019  ? Procedure: CORONARY STENT INTERVENTION;  Surgeon: Leonie Man, MD;  Location: [facility] INVASIVE CV LAB;; PCI ostial RPDA (initial attempt of PTCA only led to small local tear/dissection covered with stent) Resolute Onyx 2.5 mm x 15 mm (2.6 mm  ? CORONARY STENT INTERVENTION N/A 07/05/2019  ? Procedure: CORONARY STENT INTERVENTION;  Surgeon: Leonie Man, MD;  Location: [place] [facility];  Service: Cardiovascular;; ostial D1 95% (restenosis of PTCA site) -> DES PCI RESOLUTE ONYX 2.25 mm x 50 mm (2.6 mm) positioned to avoid the true ostium.   ? ESOPHAGOGASTRODUODENOSCOPY N/A 03/13/2014  ? Procedure: ESOPHAGOGASTRODUODENOSCOPY (EGD);  Surgeon: Wonda Horner, MD;  Location[facility] ENDOSCOPY;  Service: Endoscopy;  Laterality: N/A;  ? INTRAVASCULAR PRESSURE WIRE/FFR STUDY N/A 02/26/2019  ? Procedure: INTRAVASCULAR PRESSURE WIRE/FFR STUDY;  Surgeon: Leonie Man, MD;  Location: [place] [facility];  Service: Cardiovascular;; mCx ~65% - DFR 0.92, FR 0.82 - BORDERLINE--> MED Rx  ? INTRAVASCULAR PRESSURE WIRE/FFR STUDY N/A 11/10/2021  ? Procedure: INTRAVASCULAR PRESSURE WIRE/FFR STUDY;  Surgeon: Leonie Man, MD;  Location: [place] [facility];  Service: Cardiovascular;  Laterality: N/A;  ? LEFT HEART CATH  AND CORONARY ANGIOGRAPHY N/A 02/26/2019  ? Procedure: LEFT HEART CATH AND CORONARY ANGIOGRAPHY;  Surgeon: Leonie Man, MD;  Location: [place] [facility];  EF 55-65%. mCx 65% (FFR 0.82 - Med Rx). D2 85% - Wolverine Scoring Balloon PTCA (2.0 mm). Ost rPDA - 90% -Resolute Onyx 2.5 x 15 (2.6 mm).   ? LEFT HEART CATH AND CORONARY ANGIOGRAPHY N/A 07/05/2019  ? Procedure: LEFT HEART CATH AND CORONARY ANGIOGRAPHY;  Surgeon: Leonie Man, MD;  Location: [facility] INVASIVE CV LAB;; ostial  D1 95% (restenosis of PTCA site) -> DES PCI.  PDA stent widely patent.  Mid CX stable 65% lesion.  Proximal RCA 40%.  Mid RCA 45%.  Normal EDP.  ? LEFT HEART CATH AND CORONARY ANGIOGRAPHY N/A 11/10/2021  ? Procedure: LEFT HEART CATH AND CORONARY ANGIOGRAPHY;  Surgeon: Leonie Man, MD;  Location: [place] [facility];  Service: Cardiovascular;  Laterality: N/A;  ? ORIF FIBULA FRACTURE Left 09/2008  ? compartment syndrome  ? SHOULDER ARTHROSCOPY Bilateral   ? SHOULDER ARTHROSCOPY W/ ROTATOR CUFF REPAIR Bilateral 2004-2009  ? left 2004, right 2009  ? TOTAL HIP ARTHROPLASTY Left 03/04/2020  ? Procedure: TOTAL HIP ARTHROPLASTY ANTERIOR APPROACH;  Surgeon: Gaynelle Arabian, MD;  Location: [facility];  Service: Orthopedics;  Laterality: Left;  151mn  ? TOTAL HIP ARTHROPLASTY Right 09/30/2020  ? Procedure: TOTAL HIP ARTHROPLASTY ANTERIOR APPROACH;  Surgeon: AGaynelle Arabian MD;  Location: [facility];  Service: Orthopedics;  Laterality: Right;  1051m  ? TRANSTHORACIC ECHOCARDIOGRAM  02/2020; 10/01/2020  ? a) EF 55 to 60%.  GR 1 DD.  No R WMA.  Trivial MR.  Otherwise normal.; b) EF 55 to 60%.  Normal LV function and no wall motion abnormality.  Normal RV.  Normal valves..  ? VASECTOMY    ? WISDOM TOOTH EXTRACTION    ? ? ?Allergies ? ?Allergies  ?Allergen Reactions  ? Statins   ?  Muscle pain  ? Penicillins Itching  ?  Has patient had a PCN reaction causing immediate rash, facial/tongue/throat swelling, SOB or lightheadedness with hypotension: no ?Has patient had a PCN reaction causing severe rash involving mucus membranes or skin necrosis: unkn ?Has patient had a PCN reaction that required hospitalization: no ?Has patient had a PCN reaction occurring within the last 10 years: no ?If all of the above answers are "NO", then may proceed with Cephalosporin use. ? ?Tolerated Cephalosporin Date: 10/01/20.  ? ? ?History of Present Illness  ?  ?[age]ear old male with the above past medical history including CAD, borderline dilation of aortic  root, hypertension, hyperlipidemia, and type 2 diabetes. ? ?Cardiac catheterization July 2020 showed LCx 65% with negative FFR, D1 85%-0% s/p PCI, and PDA 90%-0% s/p DES.  Repeat scoring balloon angioplasty of diagonal 1 in November 2020.  Echo in February 2022 showed normal EF, mild LVH, mild aortic valve regurgitation.  He contacted our office in February 2023 with complaints of chest tightness and increased lower extremity edema, weight gain, and dyspnea.  He was started on 20 mg of Lasix as needed.  He did not appear volume overloaded at the time.  Repeat echocardiogram on 10/18/2021 showed EF 67%, G1 DD, mild MR, mild to moderate aortic valve regurgitation, borderline dilation of aortic root, 37 mm.  Lexiscan Myoview demonstrated 1 mm horizontal ST depression in V4 through V5, EF 58%, frequent exercise-induced PVCs including triplets, inferior perfusion defect that improved with stress, consistent with artifact, study was considered moderate risk.  He was last seen  in the office on 11/05/2021 and reported intermittent chest pressure.  He was scheduled for outpatient cardiac catheterization.  He underwent cardiac catheterization on 11/10/2021 that showed mCx 55% with RFR 0.95, stable RCA lesion, widely patent D1 and RPDA stents. No culprit lesion was found and titration of medical therapy was recommended. At the time of his cath, he was hospitalized from 11/10/2021 to 11/12/2021.  Imdur was increased to 60 mg daily and Ranexa 500 mg twice daily was added to his regimen. LVEDP was mildly elevated, Lasix 3 times weekly was recommended as needed for shortness of breath.  He did have a right groin hematoma groin ultrasound on 11/11/2021 showed no pseudoaneurysm.  He had recurrence of hematoma on 11/12/2021, repeat ultrasound again was negative for pseudoaneurysm.  He was deemed stable for discharge and was discharged home in stable condition on 11/12/2021. ? ?He presents today for follow-up accompanied by his wife.  Both  patient and his wife report feeling very frustrated with his hospital stay and the complications that occurred following his procedure. Since his hospitalization, he has been stable overall from a cardiac

## 2021-11-24 ENCOUNTER — Encounter: Payer: Self-pay | Admitting: Nurse Practitioner

## 2021-11-24 ENCOUNTER — Ambulatory Visit: Payer: Medicare Other | Admitting: Nurse Practitioner

## 2021-11-24 VITALS — BP 134/82 | HR 65 | Ht 73.0 in | Wt 192.6 lb

## 2021-11-24 DIAGNOSIS — I10 Essential (primary) hypertension: Secondary | ICD-10-CM

## 2021-11-24 DIAGNOSIS — I351 Nonrheumatic aortic (valve) insufficiency: Secondary | ICD-10-CM

## 2021-11-24 DIAGNOSIS — E785 Hyperlipidemia, unspecified: Secondary | ICD-10-CM

## 2021-11-24 DIAGNOSIS — I5189 Other ill-defined heart diseases: Secondary | ICD-10-CM

## 2021-11-24 DIAGNOSIS — S301XXS Contusion of abdominal wall, sequela: Secondary | ICD-10-CM | POA: Diagnosis not present

## 2021-11-24 DIAGNOSIS — S3012XS Contusion of groin, sequela: Secondary | ICD-10-CM

## 2021-11-24 DIAGNOSIS — I77810 Thoracic aortic ectasia: Secondary | ICD-10-CM

## 2021-11-24 DIAGNOSIS — I493 Ventricular premature depolarization: Secondary | ICD-10-CM

## 2021-11-24 DIAGNOSIS — I2510 Atherosclerotic heart disease of native coronary artery without angina pectoris: Secondary | ICD-10-CM | POA: Diagnosis not present

## 2021-11-24 LAB — CBC
Hematocrit: 46.6 % (ref 37.5–51.0)
Hemoglobin: 16.1 g/dL (ref 13.0–17.7)
MCH: 31.6 pg (ref 26.6–33.0)
MCHC: 34.5 g/dL (ref 31.5–35.7)
MCV: 91 fL (ref 79–97)
Platelets: 269 10*3/uL (ref 150–450)
RBC: 5.1 x10E6/uL (ref 4.14–5.80)
RDW: 12.3 % (ref 11.6–15.4)
WBC: 9.7 10*3/uL (ref 3.4–10.8)

## 2021-11-24 LAB — BASIC METABOLIC PANEL
BUN/Creatinine Ratio: 12 (ref 10–24)
BUN: 14 mg/dL (ref 8–27)
CO2: 25 mmol/L (ref 20–29)
Calcium: 9.6 mg/dL (ref 8.6–10.2)
Chloride: 106 mmol/L (ref 96–106)
Creatinine, Ser: 1.2 mg/dL (ref 0.76–1.27)
Glucose: 113 mg/dL — ABNORMAL HIGH (ref 70–99)
Potassium: 4.9 mmol/L (ref 3.5–5.2)
Sodium: 145 mmol/L — ABNORMAL HIGH (ref 134–144)
eGFR: 63 mL/min/{1.73_m2} (ref >59)

## 2021-11-24 MED ORDER — FUROSEMIDE 20 MG PO TABS: ORAL_TABLET | ORAL | 3 refills | Status: DC

## 2021-11-24 MED ORDER — ISOSORBIDE MONONITRATE ER 60 MG PO TB24: ORAL_TABLET | ORAL | 3 refills | Status: DC

## 2021-11-24 MED ORDER — ISOSORBIDE MONONITRATE ER 30 MG PO TB24: ORAL_TABLET | ORAL | 3 refills | Status: DC

## 2021-11-24 NOTE — Patient Instructions (Signed)
Medication Instructions:  ?Increase Imdur to 90 mg daily. ?Furosemide 20 mg daily as needed for shortness of breath, weight gain, swelling. ?*If you need a refill on your cardiac medications before your next appointment, please call your pharmacy* ? ? ?Lab Work: ?BMET and CBC today ?If you have labs (blood work) drawn today and your tests are completely normal, you will receive your results only by: ?MyChart Message (if you have MyChart) OR ?A paper copy in the mail ?If you have any lab test that is abnormal or we need to change your treatment, we will call you to review the results. ? ?Follow-Up: ?At [facility], you and your health needs are our priority.  As part of our continuing mission to provide you with exceptional heart care, we have created designated Provider Care Teams.  These Care Teams include your primary Cardiologist (physician) and Advanced Practice Providers (APPs -  Physician Assistants and Nurse Practitioners) who all work together to provide you with the care you need, when you need it. ? ?We recommend signing up for the patient portal called "MyChart".  Sign up information is provided on this After Visit Summary.  MyChart is used to connect with patients for Virtual Visits (Telemedicine).  Patients are able to view lab/test results, encounter notes, upcoming appointments, etc.  Non-urgent messages can be sent to your provider as well.   ?To learn more about what you can do with MyChart, go to [email or web address].   ? ?Your next appointment:   ?2 week(s) ? ?The format for your next appointment:   ?In Person ? ?Provider:   ?Diona Browner, NP or Almyra Deforest, PA-C ? ?Other Instructions ?Monitor your blood pressure. If it is consistently above 130/80, notify our office. ?Weigh each morning. If you have 3 pound gain over night or 5 pound gain in a week, notify clinic. ?Limit activity for the next 2 weeks. No heavy lifting or driving. ?

## 2021-11-29 ENCOUNTER — Telehealth: Payer: Self-pay

## 2021-11-29 NOTE — Telephone Encounter (Signed)
Spoke with pt. Pt was notified of lab results and will follow up as directed.  ?

## 2021-12-06 NOTE — Progress Notes (Signed)
? ? ?Office Visit  ?  ?Patient Name: Luis Guzman ?Date of Encounter: 12/08/2021 ? ?Primary Care Provider:  Eulas Post, MD ?Primary Cardiologist:  Glenetta Hew, MD ? ?Chief Complaint  ?  ?76 year old male with a history of CAD, borderline dilation of aortic root, hypertension, hyperlipidemia, and type 2 diabetes who presents for follow-up related to CAD. ? ?Past Medical History  ?  ?Past Medical History:  ?Diagnosis Date  ? AAA (abdominal aortic aneurysm) ([place])   ? Anemia   ? as infant history of  ? CAD S/P PTCA & DES PCI - for Progressive Angina 02/26/2019  ? Cath-PCI 02/26/2019: EF 55-65%. mCx 65% (FFR 0.82 - Med Rx). D2 85% - Wolverine Scoring Balloon PTCA (2.0 mm). Ost rPDA - 90% -Resolute Onyx 2.5 x 15 (2.6 mm). --> 06/2019: staged DES PCI ost D1 (RESOLUTE ONYX 2.25 mm x 50 mm (2.6 mm) positioned to avoid the true ostium)  ? Elevated PSA   ? GERD (gastroesophageal reflux disease)   ? History of diabetes mellitus, type II   ? loss weight under control currently  ? History of hiatal hernia   ? 40 yrs ago  ? History of pancreatitis   ? elevated lipase levels   ? HYPERLIPIDEMIA 01/26/2009  ? HYPERTENSION 01/26/2009  ? 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 occ. lower back pain, s/p cervical neck surgery"stiffness" remains  ? Transfusion history   ? infant "anemia"  ? ?Past Surgical History:  ?Procedure Laterality Date  ? APPENDECTOMY  ~ 1959  ? BACK SURGERY    ? CERVICAL DISC ARTHROPLASTY N/A 07/30/2018  ? Procedure: Cervical five-six Cervical six-seven Artificial disc replacement;  Surgeon: Kristeen Miss, MD;  Location: [place];  Service: Neurosurgery;  Laterality: N/A;  ? CERVICAL LAMINECTOMY  1997  ? C 4 and C 5  ? CHOLECYSTECTOMY N/A 04/15/2014  ? Procedure: LAPAROSCOPIC CHOLECYSTECTOMY WITH INTRAOPERATIVE CHOLANGIOGRAM;  Surgeon: Armandina Gemma, MD;  [facility];  Service: General;  Laterality: N/A;  ? COLONOSCOPY    ? CORONARY BALLOON ANGIOPLASTY N/A 02/26/2019  ? Procedure: CORONARY  BALLOON ANGIOPLASTY;  Surgeon: Leonie Man, MD;  Location: [place] [facility];  Service: Cardiovascular -> scoring balloon PTCA (Wolverine 2.0 mm) of ostial D2 85% reducing to 20-30%.  ? CORONARY STENT INTERVENTION N/A 02/26/2019  ? Procedure: CORONARY STENT INTERVENTION;  Surgeon: Leonie Man, MD;  Location: [facility] INVASIVE [facility] LAB;; PCI ostial RPDA (initial attempt of PTCA only led to small local tear/dissection covered with stent) Resolute Onyx 2.5 mm x 15 mm (2.6 mm  ? CORONARY STENT INTERVENTION N/A 07/05/2019  ? Procedure: CORONARY STENT INTERVENTION;  Surgeon: Leonie Man, MD;  Location: [place] [facility];  Service: Cardiovascular;; ostial D1 95% (restenosis of PTCA site) -> DES PCI RESOLUTE ONYX 2.25 mm x 50 mm (2.6 mm) positioned to avoid the true ostium.   ? ESOPHAGOGASTRODUODENOSCOPY N/A 03/13/2014  ? Procedure: ESOPHAGOGASTRODUODENOSCOPY (EGD);  Surgeon: Wonda Horner, MD;  Location: [facility] ENDOSCOPY;  Service: Endoscopy;  Laterality: N/A;  ? INTRAVASCULAR PRESSURE WIRE/FFR STUDY N/A 02/26/2019  ? Procedure: INTRAVASCULAR PRESSURE WIRE/FFR STUDY;  Surgeon: Leonie Man, MD;  Location: [place] [facility];  Service: Cardiovascular;; mCx ~65% - DFR 0.92, FR 0.82 - BORDERLINE--> MED Rx  ? INTRAVASCULAR PRESSURE WIRE/FFR STUDY N/A 11/10/2021  ? Procedure: INTRAVASCULAR PRESSURE WIRE/FFR STUDY;  Surgeon: Leonie Man, MD;  Location: [place] [facility];  Service: Cardiovascular;  Laterality: N/A;  ? LEFT HEART CATH AND  CORONARY ANGIOGRAPHY N/A 02/26/2019  ? Procedure: LEFT HEART CATH AND CORONARY ANGIOGRAPHY;  Surgeon: Leonie Man, MD;  Location: [place] [facility];  EF 55-65%. mCx 65% (FFR 0.82 - Med Rx). D2 85% - Wolverine Scoring Balloon PTCA (2.0 mm). Ost rPDA - 90% -Resolute Onyx 2.5 x 15 (2.6 mm).   ? LEFT HEART CATH AND CORONARY ANGIOGRAPHY N/A 07/05/2019  ? Procedure: LEFT HEART CATH AND CORONARY ANGIOGRAPHY;  Surgeon: Leonie Man, MD;  Location: [facility] INVASIVE CV LAB;; ostial D1 95%  (restenosis of PTCA site) -> DES PCI.  PDA stent widely patent.  Mid CX stable 65% lesion.  Proximal RCA 40%.  Mid RCA 45%.  Normal EDP.  ? LEFT HEART CATH AND CORONARY ANGIOGRAPHY N/A 11/10/2021  ? Procedure: LEFT HEART CATH AND CORONARY ANGIOGRAPHY;  Surgeon: Leonie Man, MD;  Location: [place] [facility];  Service: Cardiovascular;  Laterality: N/A;  ? ORIF FIBULA FRACTURE Left 09/2008  ? compartment syndrome  ? SHOULDER ARTHROSCOPY Bilateral   ? SHOULDER ARTHROSCOPY W/ ROTATOR CUFF REPAIR Bilateral 2004-2009  ? left 2004, right 2009  ? TOTAL HIP ARTHROPLASTY Left 03/04/2020  ? Procedure: TOTAL HIP ARTHROPLASTY ANTERIOR APPROACH;  Surgeon: Gaynelle Arabian, MD;  [facility];  Service: Orthopedics;  Laterality: Left;  169mn  ? TOTAL HIP ARTHROPLASTY Right 09/30/2020  ? Procedure: TOTAL HIP ARTHROPLASTY ANTERIOR APPROACH;  Surgeon: AGaynelle Arabian MD;  [facility];  Service: Orthopedics;  Laterality: Right;  1016m  ? TRANSTHORACIC ECHOCARDIOGRAM  02/2020; 10/01/2020  ? a) EF 55 to 60%.  GR 1 DD.  No R WMA.  Trivial MR.  Otherwise normal.; b) EF 55 to 60%.  Normal LV function and no wall motion abnormality.  Normal RV.  Normal valves..  ? VASECTOMY    ? WISDOM TOOTH EXTRACTION    ? ? ?Allergies ? ?Allergies  ?Allergen Reactions  ? Statins   ?  Muscle pain  ? Penicillins Itching  ?  Has patient had a PCN reaction causing immediate rash, facial/tongue/throat swelling, SOB or lightheadedness with hypotension: no ?Has patient had a PCN reaction causing severe rash involving mucus membranes or skin necrosis: unkn ?Has patient had a PCN reaction that required hospitalization: no ?Has patient had a PCN reaction occurring within the last 10 years: no ?If all of the above answers are "NO", then may proceed with Cephalosporin use. ? ?Tolerated Cephalosporin Date: 10/01/20.  ? ? ?History of Present Illness  ?  ?[age]ear old male with the above past medical history including CAD, borderline dilation of aortic root,  hypertension, hyperlipidemia, and type 2 diabetes. ?  ?Cardiac catheterization July 2020 showed LCx 65% with negative FFR, D1 85%-0% s/p PCI, and PDA 90%-0% s/p DES.  Repeat scoring balloon angioplasty of diagonal 1 in November 2020.  Echo in February 2022 showed normal EF, mild LVH, mild aortic valve regurgitation.  He contacted our office in February 2023 with complaints of chest tightness and increased lower extremity edema, weight gain, and dyspnea.  He was started on 20 mg of Lasix as needed.  He did not appear volume overloaded at the time.  Repeat echocardiogram on 10/18/2021 showed EF 67%, G1 DD, mild MR, mild to moderate aortic valve regurgitation, borderline dilation of aortic root, 37 mm.  Lexiscan Myoview demonstrated 1 mm horizontal ST depression in V4 through V5, EF 58%, frequent exercise-induced PVCs including triplets, inferior perfusion defect that improved with stress, consistent with artifact, study was considered moderate risk.  He was seen in  the office on 11/05/2021 and reported intermittent chest pressure.  He was scheduled for outpatient cardiac catheterization.  He underwent cardiac catheterization on 11/10/2021 that showed mCx 55% with RFR 0.95, stable RCA lesion, widely patent D1 and RPDA stents. No culprit lesion was found and titration of medical therapy was recommended.  Directly following his cath, he was hospitalized. Imdur was increased to 60 mg daily and Ranexa 500 mg twice daily was added to his regimen. LVEDP was mildly elevated, Lasix 3 times weekly was recommended as needed for shortness of breath.  He did have a right groin hematoma groin ultrasound on 11/11/2021 showed no pseudoaneurysm.  He had recurrence of hematoma on 11/12/2021, repeat ultrasound again was negative for pseudoaneurysm.  He was deemed stable for discharge and was discharged home in stable condition on 11/12/2021. ?  ?He was last seen in the office on 11/24/2021 and was stable overall from a cardiac standpoint.  He  reported 3 episodes of chest pain at rest, relieved with nitroglycerin.  Imdur was increased to 90 mg daily.  Initially, he was noted to have a moderate hematoma on exam, which appears to be healing.  He present

## 2021-12-08 ENCOUNTER — Other Ambulatory Visit: Payer: Self-pay

## 2021-12-08 ENCOUNTER — Ambulatory Visit: Payer: No Typology Code available for payment source | Admitting: Nurse Practitioner

## 2021-12-08 ENCOUNTER — Encounter: Payer: Self-pay | Admitting: Nurse Practitioner

## 2021-12-08 VITALS — BP 132/82 | HR 72 | Ht 73.0 in | Wt 191.4 lb

## 2021-12-08 DIAGNOSIS — I2510 Atherosclerotic heart disease of native coronary artery without angina pectoris: Secondary | ICD-10-CM | POA: Diagnosis not present

## 2021-12-08 DIAGNOSIS — Z79899 Other long term (current) drug therapy: Secondary | ICD-10-CM

## 2021-12-08 DIAGNOSIS — E785 Hyperlipidemia, unspecified: Secondary | ICD-10-CM

## 2021-12-08 DIAGNOSIS — I10 Essential (primary) hypertension: Secondary | ICD-10-CM

## 2021-12-08 DIAGNOSIS — I5189 Other ill-defined heart diseases: Secondary | ICD-10-CM

## 2021-12-08 DIAGNOSIS — I493 Ventricular premature depolarization: Secondary | ICD-10-CM

## 2021-12-08 DIAGNOSIS — R0602 Shortness of breath: Secondary | ICD-10-CM

## 2021-12-08 DIAGNOSIS — I77810 Thoracic aortic ectasia: Secondary | ICD-10-CM

## 2021-12-08 DIAGNOSIS — I351 Nonrheumatic aortic (valve) insufficiency: Secondary | ICD-10-CM | POA: Diagnosis not present

## 2021-12-08 DIAGNOSIS — S301XXS Contusion of abdominal wall, sequela: Secondary | ICD-10-CM | POA: Diagnosis not present

## 2021-12-08 MED ORDER — RANOLAZINE ER 500 MG PO TB12: 500.0000 mg | ORAL_TABLET | Freq: Two times a day (BID) | ORAL | 3 refills | Status: DC

## 2021-12-08 NOTE — Patient Instructions (Signed)
Medication Instructions:  ?Resume Lasix 20 mg daily ? ?*If you need a refill on your cardiac medications before your next appointment, please call your pharmacy* ? ? ?Lab Work: ?Your physician recommends that you return for lab work in 2 weeks ?BMET ? ?If you have labs (blood work) drawn today and your tests are completely normal, you will receive your results only by: ?MyChart Message (if you have MyChart) OR ?A paper copy in the mail ?If you have any lab test that is abnormal or we need to change your treatment, we will call you to review the results. ? ? ?Testing/Procedures: ?NONE ordered at this time of appointment  ? ? ?Follow-Up: ?At [facility], you and your health needs are our priority.  As part of our continuing mission to provide you with exceptional heart care, we have created designated Provider Care Teams.  These Care Teams include your primary Cardiologist (physician) and Advanced Practice Providers (APPs -  Physician Assistants and Nurse Practitioners) who all work together to provide you with the care you need, when you need it. ? ?We recommend signing up for the patient portal called "MyChart".  Sign up information is provided on this After Visit Summary.  MyChart is used to connect with patients for Virtual Visits (Telemedicine).  Patients are able to view lab/test results, encounter notes, upcoming appointments, etc.  Non-urgent messages can be sent to your provider as well.   ?To learn more about what you can do with MyChart, go to [email or web address].   ? ?Your next appointment:   ? Keep May 2023 ? ?The format for your next appointment:   ?In Person ? ?Provider:   ?Glenetta Hew, MD   ? ? ?Other Instructions ?Increase activities as tolerated.  ?Important Information About Sugar ? ? ? ? ? ? ?

## 2021-12-22 LAB — BASIC METABOLIC PANEL
BUN/Creatinine Ratio: 12 (ref 10–24)
BUN: 12 mg/dL (ref 8–27)
CO2: 20 mmol/L (ref 20–29)
Calcium: 9 mg/dL (ref 8.6–10.2)
Chloride: 108 mmol/L — ABNORMAL HIGH (ref 96–106)
Creatinine, Ser: 0.99 mg/dL (ref 0.76–1.27)
Glucose: 113 mg/dL — ABNORMAL HIGH (ref 70–99)
Potassium: 4.1 mmol/L (ref 3.5–5.2)
Sodium: 141 mmol/L (ref 134–144)
eGFR: 79 mL/min/{1.73_m2} (ref >59)

## 2021-12-24 ENCOUNTER — Telehealth: Payer: Self-pay

## 2021-12-24 NOTE — Telephone Encounter (Signed)
Spoke with pt. Pt was notified of result and will follow up as planned.  ?

## 2022-01-04 ENCOUNTER — Encounter: Payer: Self-pay | Admitting: Cardiology

## 2022-01-04 ENCOUNTER — Ambulatory Visit (INDEPENDENT_AMBULATORY_CARE_PROVIDER_SITE_OTHER): Payer: No Typology Code available for payment source | Admitting: Cardiology

## 2022-01-04 DIAGNOSIS — M791 Myalgia, unspecified site: Secondary | ICD-10-CM

## 2022-01-04 DIAGNOSIS — I10 Essential (primary) hypertension: Secondary | ICD-10-CM

## 2022-01-04 DIAGNOSIS — T466X5D Adverse effect of antihyperlipidemic and antiarteriosclerotic drugs, subsequent encounter: Secondary | ICD-10-CM

## 2022-01-04 DIAGNOSIS — I200 Unstable angina: Secondary | ICD-10-CM

## 2022-01-04 DIAGNOSIS — I25119 Atherosclerotic heart disease of native coronary artery with unspecified angina pectoris: Secondary | ICD-10-CM | POA: Diagnosis not present

## 2022-01-04 DIAGNOSIS — T466X5A Adverse effect of antihyperlipidemic and antiarteriosclerotic drugs, initial encounter: Secondary | ICD-10-CM

## 2022-01-04 DIAGNOSIS — R5383 Other fatigue: Secondary | ICD-10-CM

## 2022-01-04 DIAGNOSIS — E785 Hyperlipidemia, unspecified: Secondary | ICD-10-CM

## 2022-01-04 MED ORDER — OLMESARTAN MEDOXOMIL 20 MG PO TABS: 20.0000 mg | ORAL_TABLET | Freq: Every day | ORAL | 3 refills | Status: DC

## 2022-01-04 NOTE — Patient Instructions (Addendum)
Medication Instructions:  Stop taking Losartan   Start taking Olmesartan 20 mg   Monitor blood pressures keep log of the  readings  *If you need a refill on your cardiac medications before your next appointment, please call your pharmacy*   Lab Work:  Not needed   Testing/Procedures: not needed   Follow-Up: At [facility], you and your health needs are our priority.  As part of our continuing mission to provide you with exceptional heart care, we have created designated Provider Care Teams.  These Care Teams include your primary Cardiologist (physician) and Advanced Practice Providers (APPs -  Physician Assistants and Nurse Practitioners) who all work together to provide you with the care you need, when you need it.     Your next appointment:   1 month(s)  The format for your next appointment:   In Person  Provider:   Glenetta Hew, MD    Other Instructions

## 2022-01-04 NOTE — Progress Notes (Unsigned)
Primary Care Provider: Eulas Post, MD Cardiologist: Glenetta Hew, MD Electrophysiologist: None  Clinic Note: No chief complaint on file.   ===================================  ASSESSMENT/PLAN   Problem List Items Addressed This Visit   None   ===================================  HPI:    Luis Guzman is a 76 y.o. male with a PMH below who presents today for ***. Luis Guzman is a 76 y.o. male who is being seen today for the evaluation of *** at the request of Burchette, Alinda Sierras, MD.  Luis Guzman was last seen on ***  Recent Hospitalizations: ***  Reviewed  CV studies:    The following studies were reviewed today: (if available, images/films reviewed: From Epic Chart or Care Everywhere) ***:   Interval History:   Luis Guzman   CV Review of Symptoms (Summary) Cardiovascular ROS: {roscv:[id number]}  REVIEWED OF SYSTEMS   ROS  I have reviewed and (if needed) personally updated the patient's problem list, medications, allergies, past medical and surgical history, social and family history.   PAST MEDICAL HISTORY   Past Medical History:  Diagnosis Date   AAA (abdominal aortic aneurysm) ([place])    Anemia    as infant history of   CAD S/P PTCA & DES PCI - for Progressive Angina 02/26/2019   Cath-PCI 02/26/2019: EF 55-65%. mCx 65% (FFR 0.82 - Med Rx). D2 85% - Wolverine Scoring Balloon PTCA (2.0 mm). Ost rPDA - 90% -Resolute Onyx 2.5 x 15 (2.6 mm). 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 lower back pain, s/p cervical neck surgery"stiffness"  remains   Transfusion history    infant "anemia"    PAST SURGICAL HISTORY   Past Surgical History:  Procedure Laterality Date   APPENDECTOMY  ~ [place] N/A 07/30/2018   Procedure: Cervical five-six Cervical six-seven Artificial disc replacement;  Surgeon: Kristeen Miss, MD;  Location: [place];  Service: Neurosurgery;  Laterality: N/A;   CERVICAL LAMINECTOMY  1997   C 4 and C 5   CHOLECYSTECTOMY N/A 04/15/2014   Procedure: LAPAROSCOPIC CHOLECYSTECTOMY WITH INTRAOPERATIVE CHOLANGIOGRAM;  Surgeon: Armandina Gemma, MD;  [facility];  Service: General;  Laterality: N/A;   COLONOSCOPY     CORONARY BALLOON ANGIOPLASTY N/A 02/26/2019   Procedure: CORONARY BALLOON ANGIOPLASTY;  Surgeon: Leonie Man, MD;  Location: [place] [facility];  Service: Cardiovascular -> scoring balloon PTCA (Wolverine 2.0 mm) of ostial D2 85% reducing to 20-30%.   CORONARY STENT INTERVENTION N/A 02/26/2019   Procedure: CORONARY STENT INTERVENTION;  Surgeon: Leonie Man, MD;  Location: [place] [facility];; PCI ostial RPDA (initial attempt of PTCA only led to small local tear/dissection covered with stent) Resolute Onyx 2.5 mm x 15 mm (2.6 mm   CORONARY STENT INTERVENTION N/A 07/05/2019   Procedure: CORONARY STENT INTERVENTION;  Surgeon: Leonie Man, MD;  Location: [place] [facility];  Service: Cardiovascular;; ostial D1 95% (restenosis of PTCA site) -> DES PCI RESOLUTE  ONYX 2.25 mm x 50 mm (2.6 mm) positioned to avoid the true ostium.    ESOPHAGOGASTRODUODENOSCOPY N/A 03/13/2014   Procedure: ESOPHAGOGASTRODUODENOSCOPY (EGD);  Surgeon: Wonda Horner, MD;  Location: [facility];  Service: Endoscopy;  Laterality: N/A;   INTRAVASCULAR PRESSURE WIRE/FFR STUDY N/A 02/26/2019   Procedure: INTRAVASCULAR PRESSURE WIRE/FFR STUDY;  Surgeon: Leonie Man, MD;  Location: [place] [facility];  Service: Cardiovascular;; mCx ~65% - DFR 0.92, FR 0.82 - BORDERLINE--> MED Rx    INTRAVASCULAR PRESSURE WIRE/FFR STUDY N/A 11/10/2021   Procedure: INTRAVASCULAR PRESSURE WIRE/FFR STUDY;  Surgeon: Leonie Man, MD;  Location: [place] [facility];  Service: Cardiovascular;  Laterality: N/A;   LEFT HEART CATH AND CORONARY ANGIOGRAPHY N/A 02/26/2019   Procedure: LEFT HEART CATH AND CORONARY ANGIOGRAPHY;  Surgeon: Leonie Man, MD;  Location: [place] [facility];  EF 55-65%. mCx 65% (FFR 0.82 - Med Rx). D2 85% - Wolverine Scoring Balloon PTCA (2.0 mm). Ost rPDA - 90% -Resolute Onyx 2.5 x 15 (2.6 mm).    LEFT HEART CATH AND CORONARY ANGIOGRAPHY N/A 07/05/2019   Procedure: LEFT HEART CATH AND CORONARY ANGIOGRAPHY;  Surgeon: Leonie Man, MD;  Location: [facility] INVASIVE CV LAB;; ostial D1 95% (restenosis of PTCA site) -> DES PCI.  PDA stent widely patent.  Mid CX stable 65% lesion.  Proximal RCA 40%.  Mid RCA 45%.  Normal EDP.   LEFT HEART CATH AND CORONARY ANGIOGRAPHY N/A 11/10/2021   Procedure: LEFT HEART CATH AND CORONARY ANGIOGRAPHY;  Surgeon: Leonie Man, MD;  Location: [place] [facility];  Service: Cardiovascular;  Laterality: N/A;   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   SHOULDER ARTHROSCOPY Bilateral    SHOULDER ARTHROSCOPY W/ ROTATOR CUFF REPAIR Bilateral 2004-2009   left 2004, right 2009   TOTAL HIP ARTHROPLASTY Left 03/04/2020   Procedure: TOTAL HIP ARTHROPLASTY ANTERIOR APPROACH;  Surgeon: Gaynelle Arabian, MD;  [facility];  Service: Orthopedics;  Laterality: Left;  168mn   TOTAL HIP ARTHROPLASTY Right 09/30/2020   Procedure: TOTAL HIP ARTHROPLASTY ANTERIOR APPROACH;  Surgeon: AGaynelle Arabian MD;  [facility];  Service: Orthopedics;  Laterality: Right;  1041m   TRANSTHORACIC ECHOCARDIOGRAM  02/2020; 10/01/2020   a) EF 55 to 60%.  GR 1 DD.  No R WMA.  Trivial MR.  Otherwise normal.; b) EF 55 to 60%.  Normal LV function and no wall motion abnormality.  Normal RV.  Normal valves.. [name] VASECTOMY     WISDOM TOOTH EXTRACTION      Immunization  History  Administered Date(s) Administered   Fluad Quad(high Dose 65+) 05/06/2019, 05/06/2020, 05/18/2021   Influenza Split 05/15/2012   Influenza Whole 06/15/2010   Influenza, High Dose Seasonal PF 04/15/2016, 04/18/2017, 05/02/2018   Influenza,inj,Quad PF,6+ Mos 08/02/2013, 05/05/2014   Influenza,inj,quad, With Preservative 05/16/2019   Influenza-Unspecified 07/30/2015   PFIZER Comirnaty(Gray Top)Covid-19 Tri-Sucrose Vaccine 12/10/2020   PFIZER(Purple Top)SARS-COV-2 Vaccination 08/30/2019, 09/20/2019, 06/13/2020   Pfizer Covid-19 Vaccine Bivalent Booster 1272yr up 09/01/2021   Pneumococcal Conjugate-13 09/27/2013   Pneumococcal Polysaccharide-23 09/15/2008, 04/16/2015   Td 08/16/2003   Tdap 05/05/2014   Zoster Recombinat (Shingrix) 10/14/2018, 10/14/2018, 03/20/2019, 03/20/2019   Zoster, Live 06/17/2011    MEDICATIONS/ALLERGIES   Current Meds  Medication Sig   acetaminophen (TYLENOL) 500 MG tablet Take 1,000 mg by mouth every 6 (six) hours as needed for moderate pain.   Alirocumab (PRALUENT) 150 MG/ML SOAJ Inject 150 mg into the skin every 14 (fourteen) days.  amLODipine (NORVASC) 5 MG tablet Take 1 tablet (5 mg total) by mouth daily. (Patient taking differently: Take 5 mg by mouth every evening.)   aspirin EC 81 MG tablet Take 1 tablet (81 mg total) by mouth daily. Swallow whole. ( start after stopping clopidogrel 75 mg)   bimatoprost (LUMIGAN) 0.03 % ophthalmic solution Place 1 drop into both eyes at bedtime.   Brinzolamide-Brimonidine (SIMBRINZA) 1-0.2 % SUSP Place 1 drop into both eyes in the morning and at bedtime.   furosemide (LASIX) 20 MG tablet Take daily as needed for shortness of breath, wt gain, swelling. (Patient taking differently: 20 mg daily.)   ibuprofen (ADVIL) 200 MG tablet Take 400 mg by mouth every 6 (six) hours as needed for moderate pain.   isosorbide mononitrate (IMDUR) 30 MG 24 hr tablet Take one 30 mg with your 60 mg tablet daily (90 mg total)    isosorbide mononitrate (IMDUR) 60 MG 24 hr tablet Take one 60 mg tablet with your 30 mg tablet daily (90 mg total)   losartan (COZAAR) 100 MG tablet Take 100 mg by mouth daily.   metoprolol succinate (TOPROL XL) 25 MG 24 hr tablet Take 0.5 tablets (12.5 mg total) by mouth daily.   nitroGLYCERIN (NITROSTAT) 0.4 MG SL tablet Place 1 tablet (0.4 mg total) under the tongue every 5 (five) minutes as needed for chest pain.   potassium chloride SA (KLOR-CON M) 20 MEQ tablet Take 10 mEq (0.5 tablets) on days that you take lasix.   ranolazine (RANEXA) 500 MG 12 hr tablet Take 1 tablet (500 mg total) by mouth 2 (two) times daily.   traZODone (DESYREL) 50 MG tablet Take 50 mg by mouth at bedtime.    Allergies  Allergen Reactions   Statins     Muscle pain   Penicillins Itching    Has patient had a PCN reaction causing immediate rash, facial/tongue/throat swelling, SOB or lightheadedness with hypotension: no Has patient had a PCN reaction causing severe rash involving mucus membranes or skin necrosis: unkn Has patient had a PCN reaction that required hospitalization: no Has patient had a PCN reaction occurring within the last 10 years: no If all of the above answers are "NO", then may proceed with Cephalosporin use.  Tolerated Cephalosporin Date: 10/01/20.    SOCIAL HISTORY/FAMILY HISTORY   Reviewed in Epic:  Pertinent findings:  Social History   Tobacco Use   Smoking status: Former    Packs/day: 1.00    Years: 5.00    Pack years: 5.00    Types: Cigarettes    Quit date: 10/17/1968    Years since quitting: 53.2   Smokeless tobacco: Never  Vaping Use   Vaping Use: Never used  Substance Use Topics   Alcohol use: Not Currently    Comment: "quit drinking ~ 10/1968   Drug use: No   Social History   Social History Narrative   Not on file    OBJCTIVE -PE, EKG, labs   Wt Readings from Last 3 Encounters:  01/04/22 193 lb 6.4 oz (87.7 kg)  12/08/21 191 lb 6.4 oz (86.8 kg)  11/24/21 192  lb 9.6 oz (87.4 kg)    Physical Exam: BP (!) 144/78   Pulse 73   Ht '6\' 1"'$  (1.854 m)   Wt 193 lb 6.4 oz (87.7 kg)   SpO2 99%   BMI 25.52 kg/m  Physical Exam   Adult ECG Report  Rate: *** ;  Rhythm: {rhythm:17366};   Narrative Interpretation: ***  Recent  Labs:  ***  Lab Results  Component Value Date   CHOL 123 07/02/2021   HDL 46 07/02/2021   LDLCALC 63 07/02/2021   LDLDIRECT 129.0 08/19/2016   TRIG 64 07/02/2021   CHOLHDL 2.7 07/02/2021   Lab Results  Component Value Date   CREATININE 0.99 12/22/2021   BUN 12 12/22/2021   NA 141 12/22/2021   K 4.1 12/22/2021   CL 108 (H) 12/22/2021   CO2 20 12/22/2021      Latest Ref Rng & Units 11/24/2021   10:01 AM 11/12/2021    7:21 AM 11/11/2021    2:29 AM  CBC  WBC 3.4 - 10.8 x10E3/uL 9.7   9.3   10.8    Hemoglobin 13.0 - 17.7 g/dL 16.1   13.7   13.7    Hematocrit 37.5 - 51.0 % 46.6   38.8   38.7    Platelets 150 - 450 x10E3/uL 269   174   175      Lab Results  Component Value Date   HGBA1C 6.0 05/18/2021   Lab Results  Component Value Date   TSH 0.61 05/06/2019    ==================================================  COVID-19 Education: The signs and symptoms of COVID-19 were discussed with the patient and how to seek care for testing (follow up with PCP or arrange E-visit).    I spent a total of ***minutes with the patient spent in direct patient consultation.  Additional time spent with chart review  / charting (studies, outside notes, etc): *** min Total Time: *** min  Current medicines are reviewed at length with the patient today.  (+/- concerns) ***  This visit occurred during the SARS-CoV-2 public health emergency.  Safety protocols were in place, including screening questions prior to the visit, additional usage of staff PPE, and extensive cleaning of exam room while observing appropriate contact time as indicated for disinfecting solutions.  Notice: This dictation was prepared with Dragon dictation along  with smart phrase technology. Any transcriptional errors that result from this process are unintentional and may not be corrected upon review.  Studies Ordered:   No orders of the defined types were placed in this encounter.  No orders of the defined types were placed in this encounter.   Patient Instructions / Medication Changes & Studies & Tests Ordered   There are no Patient Instructions on file for this visit.     Glenetta Hew, M.D., M.S. Interventional Cardiologist   Pager # [phone] Phone # [phone] [address] [place], [place] [address]   Thank you for choosing Heartcare at [facility]!!

## 2022-01-06 ENCOUNTER — Encounter: Payer: Self-pay | Admitting: Cardiology

## 2022-01-06 NOTE — Assessment & Plan Note (Signed)
Off statin, and on low-dose beta-blocker.  We added the beta-blocker back because of the PVCs during treadmill.  For now I think we we will stay off statin but would like to keep low-dose Toprol on board.

## 2022-01-06 NOTE — Assessment & Plan Note (Signed)
Still having intermittent episodes of chest discomfort which I think does not sound anginal from of exertional angina standpoint.  There could be some spasm component.  We have added Ranexa, increased Imdur and added Lasix.  I wonder if there is some component of diastolic dysfunction also involved.  Plan:  Leery of pushing Toprol further because of some fatigue issues.  But this may be another option (or switch to different beta-blocker.  Did not tolerate 10 mg of amlodipine because of edema, now back to 5.  Convert from losartan to olmesartan.  This would avoid any ACE inhibitor cross-reactivityy cough.  We will also allow for better BP control.    Currently on 500 mg twice daily Ranexa.  We do have room to titrate up further  He is also on high-dose Imdur which I would like to eventually come back down on.  I would like to go back to 60 mg and then use additional 30 mg for couple days if he does not take nitroglycerin.  Otherwise we run the risk of tachyphylaxis.  For now continue with 90 mg, but provided he does not require any more nitroglycerin in the next several months, we would like to back back down.

## 2022-01-06 NOTE — Assessment & Plan Note (Signed)
Now on Praluent.  Was noticing significant myalgias symptoms with statins.  Doing well now off statin.

## 2022-01-06 NOTE — Assessment & Plan Note (Signed)
Evaluated with heart catheterization and found to have stable findings.  No new lesions and patent stents.  Interestingly the symptoms he is having are at rest and not exertional.  He is having exertional dyspnea and edema which is probably more consistent with maybe some diastolic dysfunction. Plan: Continue Ranexa and Imdur along amlodipine and low-dose Toprol.

## 2022-01-06 NOTE — Assessment & Plan Note (Addendum)
Blood pressure is high today, and he is noticing a cough that may be potentially related to losartan.  There is some cross-reactivity between ACE inhibitor's and and ARB's.    Plan:  Convert from losartan to olmesartan 20 mg, anticipate increasing dose as time goes by.  Continue Toprol 25 mg amlodipine 5 mg.-Stated that he had more edema with 10 mg dosing

## 2022-01-06 NOTE — Assessment & Plan Note (Signed)
No longer on statin.  He is on Praluent.  Labs pretty well controlled.

## 2022-01-20 ENCOUNTER — Telehealth: Payer: Self-pay | Admitting: Pharmacist

## 2022-01-20 NOTE — Chronic Care Management (AMB) (Unsigned)
Chronic Care Management Pharmacy Assistant   Name: Luis Guzman  MRN: [id number] DOB: 1945/10/24  Reason for Encounter: Disease State / Hypertension Assessment Call   Conditions to be addressed/monitored: HTN  Recent office visits:  None  Recent consult visits:  01/04/2022 Glenetta Hew MD (cardiology) - Patient was seen for Coronary artery disease involving native coronary artery of native heart with angina pectoris and additional issues. Started Olmesartan 20 mg daily. Discontinued Losartan. Follow up in 1 month.  12/08/2021 Diona Browner NP [facility]) - Patient was seen for Coronary artery disease involving native coronary artery of native heart without angina pectoris and additional issues. No medication changes. Follow up May 2023 appointment.   11/30/2021 [place]  and [employer] [facility]. No other chart notes.   11/24/2021 Diona Browner NP [facility]) - Patient was seen for Coronary artery disease involving native coronary artery of native heart without angina pectoris and additional issues. Changed Furosemide to 20 mg daily as needed for SOB, wt gain and swelling and Isosorbide 30 mg and 60 mg take 1 tablet of each daily, a total of 90 mg daily. Follow up in 2 weeks.   11/10/2021 Glenetta Hew MD (cardiology) - Patient was seen for a left heart cath and coronary angiography.  11/05/2021 Almyra Deforest PA (cardiology) - Patient was seen for Coronary artery disease involving native coronary artery of native heart with refractory angina pectoris and additional issues. Discontinued Repatha sureclick.  Follow up at appointment scheduled 01/04/2022  10/11/2021 Kirk Ruths MD (cardiology) - Patient was seen for Coronary artery disease involving native coronary artery of native heart with angina pectoris and additional issues. Started furosemide 20 mg daily prn SOB or weight gain. Decreased Amlodipine to 5 mg daily. Follow up after testing is  complete.  Hospital visits:  Admitted to [facility] on 11/10/2021 due to progressive angina. Discharge date was 11/12/2021.   New?Medications Started at [facility] Discharge:?? potassium chloride SA (KLOR-CON M) ranolazine (Ranexa) Medication Changes at Hospital Discharge: furosemide (LASIX) isosorbide mononitrate (IMDUR) Medications Discontinued at Hospital Discharge: No medications discontinued.  Medications that remain the same after Hospital Discharge:??  -All other medications will remain the same.    Medications: Outpatient Encounter Medications as of 01/20/2022  Medication Sig   acetaminophen (TYLENOL) 500 MG tablet Take 1,000 mg by mouth every 6 (six) hours as needed for moderate pain.   Alirocumab (PRALUENT) 150 MG/ML SOAJ Inject 150 mg into the skin every 14 (fourteen) days.   amLODipine (NORVASC) 5 MG tablet Take 1 tablet (5 mg total) by mouth daily. (Patient taking differently: Take 5 mg by mouth every evening.)   aspirin EC 81 MG tablet Take 1 tablet (81 mg total) by mouth daily. Swallow whole. ( start after stopping clopidogrel 75 mg)   bimatoprost (LUMIGAN) 0.03 % ophthalmic solution Place 1 drop into both eyes at bedtime.   Brinzolamide-Brimonidine (SIMBRINZA) 1-0.2 % SUSP Place 1 drop into both eyes in the morning and at bedtime.   furosemide (LASIX) 20 MG tablet Take daily as needed for shortness of breath, wt gain, swelling. (Patient taking differently: 20 mg daily.)   ibuprofen (ADVIL) 200 MG tablet Take 400 mg by mouth every 6 (six) hours as needed for moderate pain.   isosorbide mononitrate (IMDUR) 30 MG 24 hr tablet Take one 30 mg with your 60 mg tablet daily (90 mg total)   isosorbide mononitrate (IMDUR) 60 MG 24 hr tablet Take one 60 mg tablet  with your 30 mg tablet daily (90 mg total)   metoprolol succinate (TOPROL XL) 25 MG 24 hr tablet Take 0.5 tablets (12.5 mg total) by mouth daily.   nitroGLYCERIN (NITROSTAT) 0.4 MG SL tablet Place 1 tablet  (0.4 mg total) under the tongue every 5 (five) minutes as needed for chest pain.   olmesartan (BENICAR) 20 MG tablet Take 1 tablet (20 mg total) by mouth daily.   potassium chloride SA (KLOR-CON M) 20 MEQ tablet Take 10 mEq (0.5 tablets) on days that you take lasix.   ranolazine (RANEXA) 500 MG 12 hr tablet Take 1 tablet (500 mg total) by mouth 2 (two) times daily.   traZODone (DESYREL) 50 MG tablet Take 50 mg by mouth at bedtime.   No facility-administered encounter medications on file as of 01/20/2022.  Fill History: AMLODIPINE BESYLATE 5 MG TAB 10/11/2021 90   FUROSEMIDE 20 MG TABLET 10/11/2021 90   ISOSORBIDE MONONITRATE ER  30 MG TB24 06/10/2021 90   METOPROLOL SUCC ER 25 MG TAB 11/02/2021 90   NITROGLYCERIN 0.4 MG TABLET SL 12/10/2020 5   OLMESARTAN MEDOXOMIL 20 MG TAB 01/04/2022 90   [facility] OMEPRAZOLE-BICARB 20-1,100 09/20/2021 42   Reviewed chart prior to disease state call. Spoke with patient regarding BP  Recent Office Vitals: BP Readings from Last 3 Encounters:  01/04/22 (!) 144/78  12/08/21 132/82  11/24/21 134/82   Pulse Readings from Last 3 Encounters:  01/04/22 73  12/08/21 72  11/24/21 65    Wt Readings from Last 3 Encounters:  01/04/22 193 lb 6.4 oz (87.7 kg)  12/08/21 191 lb 6.4 oz (86.8 kg)  11/24/21 192 lb 9.6 oz (87.4 kg)     Kidney Function Lab Results  Component Value Date/Time   CREATININE 0.99 12/22/2021 08:25 AM   CREATININE 1.20 11/24/2021 10:01 AM   CREATININE 0.94 05/06/2020 08:42 AM   CREATININE 0.80 07/12/2013 03:53 PM   GFR 78.50 05/18/2021 08:40 AM   GFRNONAA >60 11/12/2021 07:21 AM   GFRAA 77 09/02/2020 04:05 PM       Latest Ref Rng & Units 12/22/2021    8:25 AM 11/24/2021   10:01 AM 11/12/2021    7:21 AM  BMP  Glucose 70 - 99 mg/dL 113  113  110   BUN 8 - 27 mg/dL '12  14  12   '$ Creatinine 0.76 - 1.27 mg/dL 0.99  1.20  0.96   BUN/Creat Ratio 10 - '24 12  12    '$ Sodium 134 - 144 mmol/L 141  145  138   Potassium 3.5 - 5.2 mmol/L 4.1   4.9  3.8   Chloride 96 - 106 mmol/L 108  106  108   CO2 20 - 29 mmol/L '20  25  22   '$ Calcium 8.6 - 10.2 mg/dL 9.0  9.6  8.7     Current antihypertensive regimen:  Amlodipine 5 mg daily Olmesartan 20 mg daily Metoprolol 25 mg 1/2 tablet daily Isosorbide 30 mg (90 mg total) daily  How often are you checking your Blood Pressure? Patient checks his blood pressures almost daily.  Current home BP readings: Patients last 3 readings were 119/67, 124/67, 124/66. He states this is where all his readings are always running.   What recent interventions/DTPs have been made by any provider to improve Blood Pressure control since last CPP Visit: Amlodipine decreased to 5 mg daily, discontinued Losartan and started Olmesartan 20 mg daily.   Any recent hospitalizations or ED visits since last visit with  CPP? Admitted to [facility] on 11/10/2021 due to progressive angina. Discharge date was 11/12/2021.     What diet changes have been made to improve Blood Pressure Control?  Patient follows a mediterranean diet   Breakfast - Patient doesn't generally eat breakfast Lunch - Patient will snack light if he is [place] Dinner - Patient will eat a variety with a meat and vegetable.  What exercise is being done to improve your Blood Pressure Control?  Patient states he walks and does yard work   Adherence Review: Is the patient currently on ACE/ARB medication? Yes Does the patient have >5 day gap between last estimated fill dates? No  Care Gaps: AWV - scheduled 05/10/2022 Last BP - 144/78 on 01/04/2022 Foot exam - overdue A1C - overdue Eye exam - overdue  Star Rating Drugs: Olmesartan 20 mg - last filled 01/04/2022 90 DS at [place] Pharmacist Assistant [phone]

## 2022-01-26 IMAGING — XA DG FLUORO GUIDE NDL PLC/BX
1 series · 1 of 1 positions shown · non-contrast
Comparison: none

CLINICAL DATA: Left hip pain. Partial relief with the previous
injection.

EXAM:
LEFT HIP INJECTION UNDER FLUOROSCOPY
FLUOROSCOPY TIME:  10 seconds; 4.8 uCymA DAP
TECHNIQUE: The procedure, risks (including but not limited to bleeding,
infection, organ damage ), benefits, and alternatives were explained
to the patient. Questions regarding the procedure were encouraged
and answered. The patient understands and consents to the procedure.

[Series 1: ortho adipose · 1 of 1 slices shown]
[im 1/1]
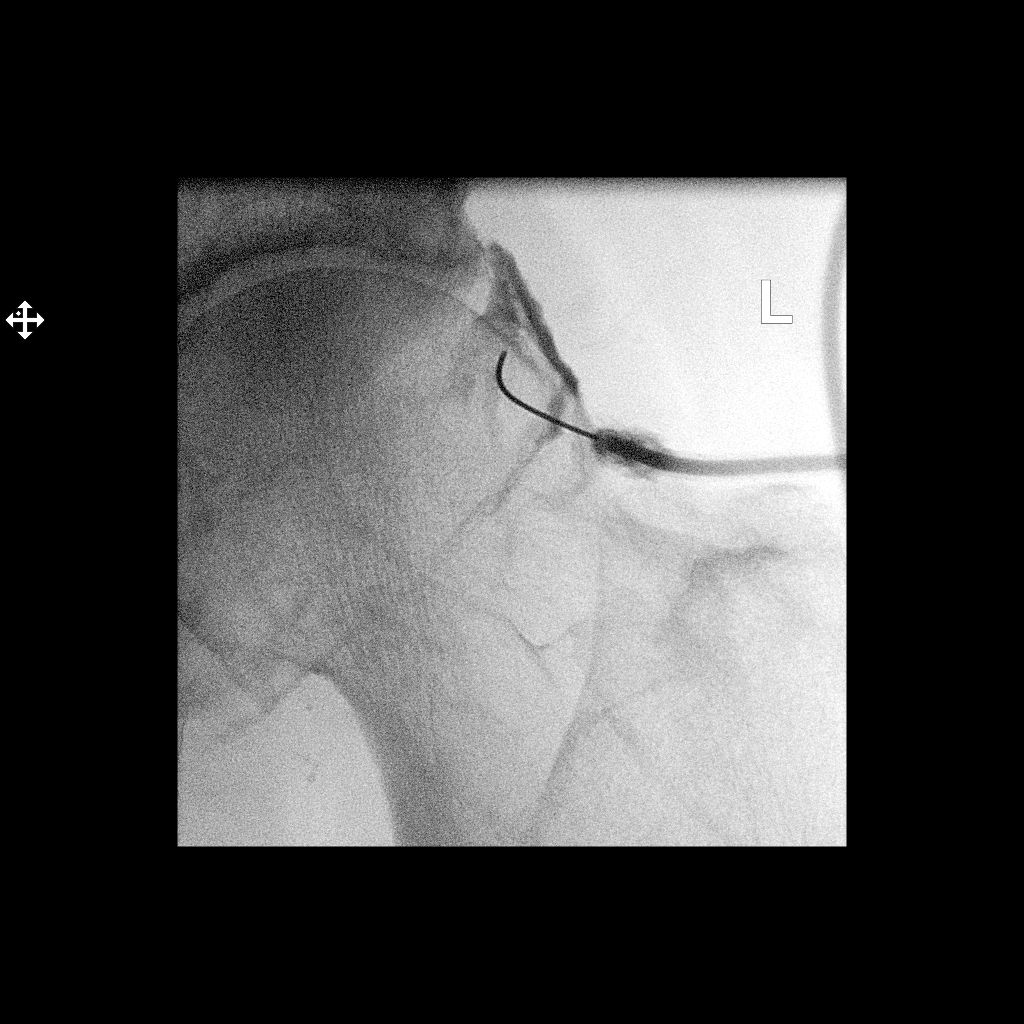

[1 of 1 positions shown; findings below may reference images not displayed]

An appropriate skin entry site was determined under fluoroscopy.
Site was marked, prepped with Betadine, draped in usual sterile
fashion, infiltrated locally with 1% lidocaine. A 22 gauge spinal
needle was advanced to the superior lateral margin of the femoral
head. 1 ml bupivacaine 0.5 % injected easily. Contrast injection of
2 ml showed intraarticular spread without any intravascular
component. 120 mg Depo-Medrol and 5 ml bupivacaine 0.5% was
administered. The patient tolerated procedure well.

COMPLICATIONS:
None immediate
IMPRESSION: 1. Technically successful left hip injection under fluoroscopy

## 2022-01-28 ENCOUNTER — Other Ambulatory Visit: Payer: Self-pay | Admitting: Cardiology

## 2022-01-29 NOTE — Progress Notes (Unsigned)
{Choose 1 Note Type (Telehealth Visit or Telephone Visit):[phone]}   Patient has given verbal permission to conduct this visit via virtual appointment and to bill insurance 01/29/2022 12:31 PM     Evaluation Performed:  Follow-up visit  Date:  01/29/2022   ID:  Luis Guzman, Luis Guzman October 17, 1945, MRN [id number]  {Patient Location:[phone]::"Home"} {Provider [phone]::"Home Office"}  PCP:  Eulas Post, MD  Cardiologist:  Glenetta Hew, MD *** Electrophysiologist:  None   Chief Complaint:   No chief complaint on file.   ====================================  ASSESSMENT & PLAN:    Problem List Items Addressed This Visit   None   ====================================  History of Present Illness:    Luis Guzman is a 76 y.o. male with PMH notable for *** who presents via audio/video conferencing for a telehealth visit today as a ***.  Luis Guzman was last seen on Jan 04, 2022 for post-cath follow-up.  He was still noticing intermittent chest discomfort-not exertional.  Potentially spasm component.  Dry weights at home between 185-186 pounds.  Reduced Imdur back to 60 mg with 30 mg PRN, added Ranexa and as well as starting Lasix, converted from losartan to olmesartan for better BP control (and potential cross-reactivity of ACE inhibitor to ARB cough from losartan l, with consideration of possible microvascular disease.  Unfortunate did not tolerate higher dose of amlodipine due to edema. ->  Noted that beta-blocker at low-dose was started due to PVCs-palpitations seem controlled.[name]  Hospitalizations:  ***   Recent - Interim CV studies:   The following studies were reviewed today: ***:  Inerval History   ***  Cardiovascular ROS: {roscv:[id number]}   ROS:  Please see the history of present illness.     ROS-malaise, cough and dyspnea  Past Medical History:  Diagnosis Date   AAA (abdominal aortic aneurysm) ([place])    Anemia    as infant history of   CAD S/P PTCA &  DES PCI - for Progressive Angina 02/26/2019   Cath-PCI 02/26/2019: EF 55-65%. mCx 65% (FFR 0.82 - Med Rx). D2 85% - Wolverine Scoring Balloon PTCA (2.0 mm). Ost rPDA - 90% -Resolute Onyx 2.5 x 15 (2.6 mm). 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 lower back pain, s/p cervical neck surgery"stiffness" remains   Transfusion history    infant "anemia"   Past Surgical History:  Procedure Laterality Date   APPENDECTOMY  ~ [place] N/A 07/30/2018   Procedure: Cervical five-six Cervical six-seven Artificial disc replacement;  Surgeon: Kristeen Miss, MD;  Location: [place];  Service: Neurosurgery;  Laterality: N/A;   CERVICAL LAMINECTOMY  1997   C 4 and C 5   CHOLECYSTECTOMY N/A 04/15/2014   Procedure: LAPAROSCOPIC CHOLECYSTECTOMY WITH INTRAOPERATIVE CHOLANGIOGRAM;  Surgeon: Armandina Gemma, MD;  Location: [facility];  Service: General;  Laterality: N/A;   COLONOSCOPY     CORONARY BALLOON ANGIOPLASTY N/A 02/26/2019   Procedure: CORONARY BALLOON ANGIOPLASTY;  Surgeon: Leonie Man, MD;  Location: [place] [facility];  Service: Cardiovascular -> scoring balloon PTCA (Wolverine 2.0 mm) of ostial D2 85% reducing to 20-30%.   CORONARY STENT INTERVENTION N/A 02/26/2019   Procedure: CORONARY STENT INTERVENTION;  Surgeon: Leonie Man, MD;  Location: [place] [facility];; PCI ostial RPDA (initial attempt of PTCA only led to small local tear/dissection covered with stent) Resolute Onyx 2.5 mm x 15 mm (2.6 mm   CORONARY STENT INTERVENTION N/A 07/05/2019    Procedure: CORONARY STENT INTERVENTION;  Surgeon: Leonie Man, MD;  Location: [place] [facility];  Service: Cardiovascular;; ostial D1 95% (restenosis of PTCA site) -> DES PCI RESOLUTE ONYX 2.25 mm x 50 mm (2.6 mm) positioned to avoid the true ostium.    ESOPHAGOGASTRODUODENOSCOPY N/A 03/13/2014   Procedure: ESOPHAGOGASTRODUODENOSCOPY (EGD);  Surgeon: Wonda Horner, MD;  Location: [facility];  Service: Endoscopy;  Laterality: N/A;   INTRAVASCULAR PRESSURE WIRE/FFR STUDY N/A 02/26/2019   Procedure: INTRAVASCULAR PRESSURE WIRE/FFR STUDY;  Surgeon: Leonie Man, MD;  Location: [place] [facility];  Service: Cardiovascular;; mCx ~65% - DFR 0.92, FR 0.82 - BORDERLINE--> MED Rx   INTRAVASCULAR PRESSURE WIRE/FFR STUDY N/A 11/10/2021   Procedure: INTRAVASCULAR PRESSURE WIRE/FFR STUDY;  Surgeon: Leonie Man, MD;  Location: [place] [facility];  Service: Cardiovascular;  Laterality: N/A;   LEFT HEART CATH AND CORONARY ANGIOGRAPHY N/A 02/26/2019   Procedure: LEFT HEART CATH AND CORONARY ANGIOGRAPHY;  Surgeon: Leonie Man, MD;  Location: [place] [facility];  EF 55-65%. mCx 65% (FFR 0.82 - Med Rx). D2 85% - Wolverine Scoring Balloon PTCA (2.0 mm). Ost rPDA - 90% -Resolute Onyx 2.5 x 15 (2.6 mm).    LEFT HEART CATH AND CORONARY ANGIOGRAPHY N/A 07/05/2019   Procedure: LEFT HEART CATH AND CORONARY ANGIOGRAPHY;  Surgeon: Leonie Man, MD;  Location: [facility] INVASIVE CV LAB;; ostial D1 95% (restenosis of PTCA site) -> DES PCI.  PDA stent widely patent.  Mid CX stable 65% lesion.  Proximal RCA 40%.  Mid RCA 45%.  Normal EDP.   LEFT HEART CATH AND CORONARY ANGIOGRAPHY N/A 11/10/2021   Procedure: LEFT HEART CATH AND CORONARY ANGIOGRAPHY;  Surgeon: Leonie Man, MD;  Location: [place] [facility];  Service: Cardiovascular;  Laterality: N/A;   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   SHOULDER ARTHROSCOPY Bilateral    SHOULDER ARTHROSCOPY W/ ROTATOR CUFF REPAIR Bilateral 2004-2009   left  2004, right 2009   TOTAL HIP ARTHROPLASTY Left 03/04/2020   Procedure: TOTAL HIP ARTHROPLASTY ANTERIOR APPROACH;  Surgeon: Gaynelle Arabian, MD;  Location: [facility];  Service: Orthopedics;  Laterality: Left;  179mn   TOTAL HIP ARTHROPLASTY Right 09/30/2020   Procedure: TOTAL HIP ARTHROPLASTY ANTERIOR APPROACH;  Surgeon: AGaynelle Arabian MD;  [facility]: [facility];  Service: Orthopedics;  Laterality: Right;  1032m   TRANSTHORACIC ECHOCARDIOGRAM  02/2020; 10/01/2020   a) EF 55 to 60%.  GR 1 DD.  No R WMA.  Trivial MR.  Otherwise normal.; b) EF 55 to 60%.  Normal LV function and no wall motion abnormality.  Normal RV.  Normal valves.. [name] VASECTOMY     WISDOM TOOTH EXTRACTION      Left Heart Cath with Coronary Angiography 11/10/2021: Stable mid LCx with 5% (RFR 0.95).  Stable proximal RCA 40% and mid RCA 55%.  D1 and RPDA stents widely patent. => Lasix dose increased to 3 days a week, Ranexa 500 mg twice daily and Imdur increased to 60 mg.  Post-cath complication with failed minx closure device.  Patient developed a hematoma (no surgery) several hours after device placement.  Patient spent the next 2 days in the hospital.   No outpatient medications have been marked as taking for the 02/04/22 encounter (Appointment) with Leonie Man, MD.     Allergies:   Statins and Penicillins   Social History   Tobacco Use   Smoking status: Former    Packs/day: 1.00    Years: 5.00    Total pack years: 5.00    Types: Cigarettes    Quit date: 10/17/1968    Years since quitting: 53.3   Smokeless tobacco: Never  Vaping Use   Vaping Use: Never used  Substance Use Topics   Alcohol use: Not Currently    Comment: "quit drinking ~ 10/1968   Drug use: No     Family Hx: The patient's family history includes Arthritis in his sister; Hypertension in his father and mother; Stroke (age of onset: 21) in his sister.   Labs/Other Tests and Data Reviewed:    EKG:  {KGS:[id number]}  Recent Labs: 07/02/2021: ALT  16 10/18/2021: NT-Pro BNP <36 11/24/2021: Hemoglobin 16.1; Platelets 269 12/22/2021: BUN 12; Creatinine, Ser 0.99; Potassium 4.1; Sodium 141   Recent Lipid Panel Lab Results  Component Value Date/Time   CHOL 123 07/02/2021 09:18 AM   TRIG 64 07/02/2021 09:18 AM   HDL 46 07/02/2021 09:18 AM   CHOLHDL 2.7 07/02/2021 09:18 AM   CHOLHDL 2 05/18/2021 08:40 AM   LDLCALC 63 07/02/2021 09:18 AM   LDLCALC 98 05/06/2020 08:42 AM   LDLDIRECT 129.0 08/19/2016 08:38 AM    Wt Readings from Last 3 Encounters:  01/04/22 193 lb 6.4 oz (87.7 kg)  12/08/21 191 lb 6.4 oz (86.8 kg)  11/24/21 192 lb 9.6 oz (87.4 kg)     Objective:    Vital Signs:  There were no vitals taken for this visit.  {[facility] Virtual Exam (Optional):[phone]::"VITAL SIGNS:  reviewed"}   ==========================================  COVID-19 Education: The signs and symptoms of COVID-19 were discussed with the patient and how to seek care for testing (follow up with PCP or arrange E-visit).   The importance of social distancing was discussed today.  Time:   Today, I have spent *** minutes with the patient with telehealth technology discussing the above problems.   An additional ***minutes spent charting (reviewing prior notes, hospital records, studies, labs etc.) Total ***minutes   Medication Adjustments/Labs and Tests Ordered: Current medicines are reviewed at length with the patient today.  Concerns regarding medicines are outlined above.   There are no Patient Instructions on file for this visit.   Signed, Glenetta Hew, MD  01/29/2022 12:31 PM    [place]

## 2022-02-04 ENCOUNTER — Encounter: Payer: Self-pay | Admitting: Cardiology

## 2022-02-04 ENCOUNTER — Telehealth (INDEPENDENT_AMBULATORY_CARE_PROVIDER_SITE_OTHER): Payer: No Typology Code available for payment source | Admitting: Cardiology

## 2022-02-04 ENCOUNTER — Telehealth: Payer: Self-pay | Admitting: *Deleted

## 2022-02-04 VITALS — BP 111/66 | HR 53 | Ht 73.0 in | Wt 186.0 lb

## 2022-02-04 DIAGNOSIS — I25119 Atherosclerotic heart disease of native coronary artery with unspecified angina pectoris: Secondary | ICD-10-CM | POA: Diagnosis not present

## 2022-02-04 DIAGNOSIS — I10 Essential (primary) hypertension: Secondary | ICD-10-CM

## 2022-02-04 DIAGNOSIS — I7143 Infrarenal abdominal aortic aneurysm, without rupture: Secondary | ICD-10-CM | POA: Diagnosis not present

## 2022-02-04 DIAGNOSIS — T466X5D Adverse effect of antihyperlipidemic and antiarteriosclerotic drugs, subsequent encounter: Secondary | ICD-10-CM

## 2022-02-04 DIAGNOSIS — I2510 Atherosclerotic heart disease of native coronary artery without angina pectoris: Secondary | ICD-10-CM

## 2022-02-04 DIAGNOSIS — E785 Hyperlipidemia, unspecified: Secondary | ICD-10-CM | POA: Diagnosis not present

## 2022-02-04 DIAGNOSIS — M791 Myalgia, unspecified site: Secondary | ICD-10-CM

## 2022-02-04 DIAGNOSIS — R5383 Other fatigue: Secondary | ICD-10-CM

## 2022-02-04 DIAGNOSIS — Z9861 Coronary angioplasty status: Secondary | ICD-10-CM

## 2022-02-04 NOTE — Assessment & Plan Note (Signed)
He had horrible myalgias with multiple different statins.  Is now doing well on PCSK9 inhibitor.

## 2022-02-10 ENCOUNTER — Encounter: Payer: Self-pay | Admitting: Cardiology

## 2022-02-13 NOTE — Telephone Encounter (Signed)
Lets go with irbesartan 150 mg p.o. daily.  Dispense #90, 3 refills.  Glenetta Hew, MD

## 2022-02-14 ENCOUNTER — Other Ambulatory Visit: Payer: Self-pay

## 2022-02-14 ENCOUNTER — Telehealth: Payer: Self-pay

## 2022-02-14 MED ORDER — IRBESARTAN 150 MG PO TABS: 150.0000 mg | ORAL_TABLET | Freq: Every day | ORAL | 3 refills | Status: DC

## 2022-02-14 MED ORDER — IRBESARTAN 150 MG PO TABS
150.0000 mg | ORAL_TABLET | Freq: Every day | ORAL | 3 refills | Status: DC
Start: 2022-02-14 — End: 2022-02-14

## 2022-02-14 NOTE — Telephone Encounter (Signed)
Patient returned call.  He would like the medication to be sent to the [place] in [place].

## 2022-02-14 NOTE — Telephone Encounter (Signed)
Spoke to patient . He is aware medication was sent to the [facility] pharmacy in [place].

## 2022-02-14 NOTE — Telephone Encounter (Signed)
LMTCB regarding new prescription. 

## 2022-03-21 ENCOUNTER — Encounter: Payer: Self-pay | Admitting: Family Medicine

## 2022-03-21 ENCOUNTER — Telehealth (INDEPENDENT_AMBULATORY_CARE_PROVIDER_SITE_OTHER): Payer: No Typology Code available for payment source | Admitting: Family Medicine

## 2022-03-21 VITALS — BP 141/75 | Ht 73.0 in | Wt 186.0 lb

## 2022-03-21 DIAGNOSIS — U071 COVID-19: Secondary | ICD-10-CM | POA: Diagnosis not present

## 2022-03-21 MED ORDER — MOLNUPIRAVIR EUA 200MG CAPSULE: 4.0000 | ORAL_CAPSULE | Freq: Two times a day (BID) | ORAL | 0 refills | Status: AC

## 2022-03-21 MED ORDER — HYDROCODONE BIT-HOMATROP MBR 5-1.5 MG/5ML PO SOLN: 5.0000 mL | Freq: Four times a day (QID) | ORAL | 0 refills | Status: DC | PRN

## 2022-03-21 NOTE — Progress Notes (Signed)
Patient ID: Luis Guzman, male   DOB: 04/30/1946, 76 y.o.   MRN: [id number]  Virtual Visit via Video Note  I connected with [name] on 03/21/22 at  4:15 PM EDT by a video enabled telemedicine application and verified that I am speaking with the correct person using two identifiers.  Location patient: home Location provider:work or home office Persons participating in the virtual visit: patient, provider  I discussed the limitations of evaluation and management by telemedicine and the availability of in person appointments. The patient expressed understanding and agreed to proceed.   HPI: Luis Guzman is a 76 year old with history of CAD, hypertension, hyperlipidemia.  Positive COVID test earlier today.  His wife was diagnosed with COVID over the weekend.  He started having symptoms Saturday of sore throat, cough, nasal congestion, headache.  Cough mostly dry.  Interfering with sleep at night.  No relief with Robitussin.  Has taken Tessalon in the past without relief.  No nausea or vomiting.  No dyspnea.  Vital signs stable.  Past Medical History:  Diagnosis Date   AAA (abdominal aortic aneurysm) ([place])    Anemia    as infant history of   CAD S/P PTCA & DES PCI - for Progressive Angina 02/26/2019   Cath-PCI 02/26/2019: EF 55-65%. mCx 65% (FFR 0.82 - Med Rx). D2 85% - Wolverine Scoring Balloon PTCA (2.0 mm). Ost rPDA - 90% -Resolute Onyx 2.5 x 15 (2.6 mm). 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 lower back pain, s/p cervical neck surgery"stiffness" remains   Transfusion history     infant "anemia"   Past Surgical History:  Procedure Laterality Date   APPENDECTOMY  ~ [place] N/A 07/30/2018   Procedure: Cervical five-six Cervical six-seven Artificial disc replacement;  Surgeon: Kristeen Miss, MD;  Location: [place];  Service: Neurosurgery;  Laterality: N/A;   CERVICAL LAMINECTOMY  1997   C 4 and C 5   CHOLECYSTECTOMY N/A 04/15/2014   Procedure: LAPAROSCOPIC CHOLECYSTECTOMY WITH INTRAOPERATIVE CHOLANGIOGRAM;  Surgeon: Armandina Gemma, MD;  [facility];  Service: General;  Laterality: N/A;   COLONOSCOPY     CORONARY BALLOON ANGIOPLASTY N/A 02/26/2019   Procedure: CORONARY BALLOON ANGIOPLASTY;  Surgeon: Leonie Man, MD;  Location: [place] [facility];  Service: Cardiovascular -> scoring balloon PTCA (Wolverine 2.0 mm) of ostial D2 85% reducing to 20-30%.   CORONARY STENT INTERVENTION N/A 02/26/2019   Procedure: CORONARY STENT INTERVENTION;  Surgeon: Leonie Man, MD;  Location: [place] [facility];; PCI ostial RPDA (initial attempt of PTCA only led to small local tear/dissection covered with stent) Resolute Onyx 2.5 mm x 15 mm (2.6 mm   CORONARY STENT INTERVENTION N/A 07/05/2019   Procedure: CORONARY STENT INTERVENTION;  Surgeon: Leonie Man, MD;  Location: [place] [facility];  Service: Cardiovascular;; ostial D1 95% (restenosis of PTCA site) -> DES PCI RESOLUTE ONYX 2.25 mm x 50 mm (2.6 mm) positioned to avoid the true  ostium.    ESOPHAGOGASTRODUODENOSCOPY N/A 03/13/2014   Procedure: ESOPHAGOGASTRODUODENOSCOPY (EGD);  Surgeon: Wonda Horner, MD;  Location: [facility];  Service: Endoscopy;  Laterality: N/A;   INTRAVASCULAR PRESSURE WIRE/FFR STUDY N/A 02/26/2019   Procedure: INTRAVASCULAR PRESSURE WIRE/FFR STUDY;  Surgeon: Leonie Man, MD;  Location: [place] [facility];  Service: Cardiovascular;; mCx ~65% - DFR 0.92, FR 0.82 - BORDERLINE--> MED Rx   INTRAVASCULAR PRESSURE WIRE/FFR STUDY N/A 11/10/2021   Procedure:  INTRAVASCULAR PRESSURE WIRE/FFR STUDY;  Surgeon: Leonie Man, MD;  Location: [place] [facility];  Service: Cardiovascular;  Laterality: N/A;   LEFT HEART CATH AND CORONARY ANGIOGRAPHY N/A 02/26/2019   Procedure: LEFT HEART CATH AND CORONARY ANGIOGRAPHY;  Surgeon: Leonie Man, MD;  Location: [place] [facility];  EF 55-65%. mCx 65% (FFR 0.82 - Med Rx). D2 85% - Wolverine Scoring Balloon PTCA (2.0 mm). Ost rPDA - 90% -Resolute Onyx 2.5 x 15 (2.6 mm).    LEFT HEART CATH AND CORONARY ANGIOGRAPHY N/A 07/05/2019   Procedure: LEFT HEART CATH AND CORONARY ANGIOGRAPHY;  Surgeon: Leonie Man, MD;  Location: [facility] IN[facility]ASIVE [facility];; ostial D1 95% (restenosis of PTCA site) -> DES PCI.  PDA stent widely patent.  Mid CX stable 65% lesion.  Proximal RCA 40%.  Mid RCA 45%.  Normal EDP.   LEFT HEART CATH AND CORONARY ANGIOGRAPHY N/A 11/10/2021   Procedure: LEFT HEART CATH AND CORONARY ANGIOGRAPHY;  Surgeon: Leonie Man, MD;  Location: [place] [facility];  Service: Cardiovascular;  Laterality: N/A;   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   SHOULDER ARTHROSCOPY Bilateral    SHOULDER ARTHROSCOPY W/ ROTATOR CUFF REPAIR Bilateral 2004-2009   left 2004, right 2009   TOTAL HIP ARTHROPLASTY Left 03/04/2020   Procedure: TOTAL HIP ARTHROPLASTY ANTERIOR APPROACH;  Surgeon: Gaynelle Arabian, MD;  [facility];  Service: Orthopedics;  Laterality: Left;  168mn   TOTAL HIP ARTHROPLASTY Right 09/30/2020   Procedure: TOTAL HIP ARTHROPLASTY ANTERIOR APPROACH;  Surgeon: AGaynelle Arabian MD;  [facility];  Service: Orthopedics;  Laterality: Right;  1014m   TRANSTHORACIC ECHOCARDIOGRAM  02/2020; 10/01/2020   a) EF 55 to 60%.  GR 1 DD.  No R WMA.  Trivial MR.  Otherwise normal.; b) EF 55 to 60%.  Normal LV function and no wall motion abnormality.  Normal RV.  Normal valves.. [name] VASECTOMY     WISDOM TOOTH EXTRACTION      reports that he quit smoking about 53 years ago. His smoking use included  cigarettes. He has a 5.00 pack-year smoking history. He has never used smokeless tobacco. He reports that he does not currently use alcohol. He reports that he does not use drugs. family history includes Arthritis in his sister; Hypertension in his father and mother; Stroke (age of onset: 6076in his sister. Allergies  Allergen Reactions   Statins     Muscle pain   Penicillins Itching    Has patient had a PCN reaction causing immediate rash, facial/tongue/throat swelling, SOB or lightheadedness with hypotension: no Has patient had a PCN reaction causing severe rash involving mucus membranes or skin necrosis: unkn Has patient had a PCN reaction that required hospitalization: no Has patient had a PCN reaction occurring within the last 10 years: no If all of the above answers are "NO", then may proceed with Cephalosporin use.  Tolerated Cephalosporin Date: 10/01/20.      ROS: See pertinent positives and negatives per HPI.  Past Medical History:  Diagnosis Date   AAA (abdominal aortic aneurysm) ([place])    Anemia    as infant history of   CAD S/P PTCA & DES PCI - for Progressive Angina 02/26/2019   Cath-PCI 02/26/2019: EF 55-65%. mCx 65% (FFR 0.82 - Med Rx). D2 85% - Wolverine Scoring Balloon PTCA (2.0 mm). Ost rPDA - 90% -Resolute Onyx 2.5 x 15 (2.6 mm). 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 lower back pain, s/p cervical neck surgery"stiffness" remains   Transfusion history    infant "anemia"    Past Surgical History:  Procedure Laterality Date   APPENDECTOMY  ~ [place] N/A 07/30/2018   Procedure: Cervical five-six Cervical six-seven Artificial disc replacement;  Surgeon: Kristeen Miss, MD;  Location: [place];  Service: Neurosurgery;  Laterality: N/A;   CERVICAL LAMINECTOMY  1997   C 4 and C 5   CHOLECYSTECTOMY N/A 04/15/2014   Procedure: LAPAROSCOPIC CHOLECYSTECTOMY WITH INTRAOPERATIVE CHOLANGIOGRAM;  Surgeon: Armandina Gemma, MD;  [facility];  Service: General;  Laterality: N/A;   COLONOSCOPY     CORONARY BALLOON ANGIOPLASTY N/A 02/26/2019   Procedure: CORONARY BALLOON ANGIOPLASTY;  Surgeon: Leonie Man, MD;  Location: [place] [facility];  Service: Cardiovascular -> scoring balloon PTCA (Wolverine 2.0 mm) of ostial D2 85% reducing to 20-30%.   CORONARY STENT INTERVENTION N/A 02/26/2019   Procedure: CORONARY STENT INTERVENTION;  Surgeon: Leonie Man, MD;  Location: [place] [facility];; PCI ostial RPDA (initial attempt of PTCA only led to small local tear/dissection covered with stent) Resolute Onyx 2.5 mm x 15 mm (2.6 mm   CORONARY STENT INTERVENTION N/A 07/05/2019   Procedure: CORONARY STENT INTERVENTION;  Surgeon: Leonie Man, MD;  Location: [place] [facility];  Service: Cardiovascular;; ostial D1 95% (restenosis of PTCA site) -> DES PCI RESOLUTE ONYX 2.25 mm x 50 mm (2.6 mm) positioned to avoid the true ostium.    ESOPHAGOGASTRODUODENOSCOPY N/A 03/13/2014   Procedure: ESOPHAGOGASTRODUODENOSCOPY (EGD);  Surgeon: Wonda Horner, MD;  Location: [facility] ENDOSCOPY;  Service: Endoscopy;  Laterality: N/A;   INTRAVASCULAR PRESSURE WIRE/FFR STUDY N/A 02/26/2019   Procedure: INTRAVASCULAR PRESSURE WIRE/FFR STUDY;  Surgeon: Leonie Man, MD;  Location: [place] [facility];  Service: Cardiovascular;; mCx ~65% - DFR 0.92, FR 0.82 - BORDERLINE--> MED Rx   INTRAVASCULAR PRESSURE WIRE/FFR STUDY N/A 11/10/2021   Procedure: INTRAVASCULAR PRESSURE WIRE/FFR STUDY;  Surgeon: Leonie Man, MD;  Location: [facility];   Service: Cardiovascular;  Laterality: N/A;   LEFT HEART CATH AND CORONARY ANGIOGRAPHY N/A 02/26/2019   Procedure: LEFT HEART CATH AND CORONARY ANGIOGRAPHY;  Surgeon: Leonie Man, MD;  Location: [place] [facility];  EF 55-65%. mCx 65% (FFR 0.82 - Med Rx). D2 85% - Wolverine Scoring Balloon PTCA (2.0 mm). Ost rPDA - 90% -Resolute Onyx 2.5 x 15 (2.6 mm).    LEFT HEART CATH AND CORONARY ANGIOGRAPHY N/A 07/05/2019  Procedure: LEFT HEART CATH AND CORONARY ANGIOGRAPHY;  Surgeon: Leonie Man, MD;  Location: [facility] IN[facility]ASIVE [facility];; ostial D1 95% (restenosis of PTCA site) -> DES PCI.  PDA stent widely patent.  Mid CX stable 65% lesion.  Proximal RCA 40%.  Mid RCA 45%.  Normal EDP.   LEFT HEART CATH AND CORONARY ANGIOGRAPHY N/A 11/10/2021   Procedure: LEFT HEART CATH AND CORONARY ANGIOGRAPHY;  Surgeon: Leonie Man, MD;  Location: [place] [facility];  Service: Cardiovascular;  Laterality: N/A;   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   SHOULDER ARTHROSCOPY Bilateral    SHOULDER ARTHROSCOPY W/ ROTATOR CUFF REPAIR Bilateral 2004-2009   left 2004, right 2009   TOTAL HIP ARTHROPLASTY Left 03/04/2020   Procedure: TOTAL HIP ARTHROPLASTY ANTERIOR APPROACH;  Surgeon: Gaynelle Arabian, MD;  [facility];  Service: Orthopedics;  Laterality: Left;  117mn   TOTAL HIP ARTHROPLASTY Right 09/30/2020   Procedure: TOTAL HIP ARTHROPLASTY ANTERIOR APPROACH;  Surgeon: AGaynelle Arabian MD;  [facility];  Service: Orthopedics;  Laterality: Right;  1045m   TRANSTHORACIC ECHOCARDIOGRAM  02/2020; 10/01/2020   a) EF 55 to 60%.  GR 1 DD.  No R WMA.  Trivial MR.  Otherwise normal.; b) EF 55 to 60%.  Normal LV function and no wall motion abnormality.  Normal RV.  Normal valves.. [name] VASECTOMY     WISDOM TOOTH EXTRACTION      Family History  Problem Relation Age of Onset   Hypertension Mother    Hypertension Father    Arthritis Sister        rheumatoid   Stroke Sister [id number]  SOCIAL HX:  Ex-smoker.   Current Outpatient Medications:    acetaminophen (TYLENOL) 500 MG tablet, Take 1,000 mg by mouth every 6 (six) hours as needed for moderate pain., Disp: , Rfl:    Alirocumab (PRALUENT) 150 MG/ML SOAJ, Inject 150 mg into the skin every 14 (fourteen) days., Disp: , Rfl:    aspirin EC 81 MG tablet, Take 1 tablet (81 mg total) by mouth daily. Swallow whole. ( start after stopping clopidogrel 75 mg), Disp: 90 tablet, Rfl: 3   bimatoprost (LUMIGAN) 0.03 % ophthalmic solution, Place 1 drop into both eyes at bedtime., Disp: , Rfl:    Brinzolamide-Brimonidine (SIMBRINZA) 1-0.2 % SUSP, Place 1 drop into both eyes in the morning and at bedtime., Disp: , Rfl:    furosemide (LASIX) 20 MG tablet, Take daily as needed for shortness of breath, wt gain, swelling. (Patient taking differently: 20 mg daily.), Disp: 90 tablet, Rfl: 3   HYDROcodone bit-homatropine (HYCODAN) 5-1.5 MG/5ML syrup, Take 5 mLs by mouth every 6 (six) hours as needed for cough., Disp: 120 mL, Rfl: 0   ibuprofen (ADVIL) 200 MG tablet, Take 400 mg by mouth every 6 (six) hours as needed for moderate pain., Disp: , Rfl:    irbesartan (AVAPRO) 150 MG tablet, Take 1 tablet (150 mg total) by mouth daily., Disp: 90 tablet, Rfl: 3   isosorbide mononitrate (IMDUR) 60 MG 24 hr tablet, Take one 60 mg tablet with your 30 mg tablet daily (90 mg total), Disp: 90 tablet, Rfl: 3   metoprolol succinate (TOPROL-XL) 25 MG 24 hr tablet, TAKE 1/2 TABLET BY MOUTH EVERY DAY, Disp: 45 tablet, Rfl: 1   molnupiravir EUA (LAGEVRIO) 200 mg CAPS capsule, Take 4 capsules (800 mg total) by mouth 2 (two) times daily for 5 days., Disp: 40 capsule, Rfl: 0   nitroGLYCERIN (NITROSTAT)  0.4 MG SL tablet, Place 1 tablet (0.4 mg total) under the tongue every 5 (five) minutes as needed for chest pain., Disp: 25 tablet, Rfl: 3   potassium chloride SA (KLOR-CON M) 20 MEQ tablet, Take 10 mEq (0.5 tablets) on days that you take lasix., Disp: 90 tablet, Rfl: 1   ranolazine  (RANEXA) 500 MG 12 hr tablet, Take 1 tablet (500 mg total) by mouth 2 (two) times daily., Disp: 180 tablet, Rfl: 3   traZODone (DESYREL) 50 MG tablet, Take 50 mg by mouth at bedtime., Disp: , Rfl:    amLODipine (NORVASC) 5 MG tablet, Take 1 tablet (5 mg total) by mouth daily. (Patient taking differently: Take 5 mg by mouth every evening.), Disp: 90 tablet, Rfl: 3  EXAM:  VITALS per patient if applicable:  GENERAL: alert, oriented, appears well and in no acute distress  HEENT: atraumatic, conjunttiva clear, no obvious abnormalities on inspection of external nose and ears  NECK: normal movements of the head and neck  LUNGS: on inspection no signs of respiratory distress, breathing rate appears normal, no obvious gross SOB, gasping or wheezing  CV: no obvious cyanosis  MS: moves all visible extremities without noticeable abnormality  PSYCH/NEURO: pleasant and cooperative, no obvious depression or anxiety, speech and thought processing grossly intact  ASSESSMENT AND PLAN:  Discussed the following assessment and plan:  COVID.  Patient has comorbidities as above and age 85.  He is doing Environmental health practitioner well but we discussed sending in molnupiravir given above factors.  Also agreed to send in limited amount of Hycodan cough syrup 1 teaspoon nightly for severe cough. -He is aware of isolation guidelines -Follow-up for any persistent or worsening symptoms     I discussed the assessment and treatment plan with the patient. The patient was provided an opportunity to ask questions and all were answered. The patient agreed with the plan and demonstrated an understanding of the instructions.   The patient was advised to call back or seek an in-person evaluation if the symptoms worsen or if the condition fails to improve as anticipated.     Carolann Littler, MD

## 2022-04-04 ENCOUNTER — Other Ambulatory Visit: Payer: Self-pay | Admitting: Cardiology

## 2022-04-22 ENCOUNTER — Other Ambulatory Visit: Payer: Self-pay | Admitting: Cardiology

## 2022-05-10 ENCOUNTER — Ambulatory Visit: Payer: Medicare Other

## 2022-05-13 ENCOUNTER — Other Ambulatory Visit: Payer: Self-pay | Admitting: Cardiology

## 2022-05-16 ENCOUNTER — Ambulatory Visit (INDEPENDENT_AMBULATORY_CARE_PROVIDER_SITE_OTHER): Payer: Medicare Other

## 2022-05-16 VITALS — Ht 73.0 in | Wt 186.0 lb

## 2022-05-16 DIAGNOSIS — Z0000 Encounter for general adult medical examination without abnormal findings: Secondary | ICD-10-CM | POA: Diagnosis not present

## 2022-05-16 NOTE — Progress Notes (Signed)
I connected with  [name] today via telehealth video enabled device and verified that I am speaking with the correct person using two identifiers.   Location: Patient: home  Provider: work  Persons participating in virtual visit: Glenna Durand LPN, Dashun Borre  I discussed the limitations, risks, security and privacy concerns of performing an evaluation and management service by video and the availability of in person appointments. The patient expressed understanding and agreed to proceed.   Some vital signs may be absent or patient reported.     Subjective:   Luis Guzman is a 76 y.o. male who presents for Medicare Annual/Subsequent preventive examination.  Review of Systems     Cardiac Risk Factors include: advanced age (>72mn, >>46women);dyslipidemia;hypertension;male gender     Objective:    Today's Vitals   05/16/22 0801  Weight: 186 lb (84.4 kg)  Height: '6\' 1"'$  (1.854 m)   Body mass index is 24.54 kg/m.     05/16/2022    8:06 AM 11/10/2021   11:18 AM 05/07/2021    8:27 AM 09/30/2020    3:30 PM 09/30/2020   10:37 AM 09/07/2020    7:56 AM 05/15/2020    8:23 AM  Advanced Directives  Does Patient Have a Medical Advance Directive? Yes Yes Yes Yes;No Yes Yes Yes  Type of AParamedicof [place]shlandLiving will HSebringLiving will HWestwoodLiving will HHollandaleLiving will HKotlikLiving will HHarvey CedarsLiving will HHales CornersLiving will  Does patient want to make changes to medical advance directive?  No - Patient declined  No - Guardian declined   No - Patient declined  Copy of HPrestonin Chart? No - copy requested No - copy requested No - copy requested  No - copy requested  No - copy requested    Current Medications (verified) Outpatient Encounter Medications as of 05/16/2022  Medication Sig   acetaminophen  (TYLENOL) 500 MG tablet Take 1,000 mg by mouth every 6 (six) hours as needed for moderate pain.   Alirocumab (PRALUENT) 150 MG/ML SOAJ Inject 150 mg into the skin every 14 (fourteen) days.   aspirin EC 81 MG tablet Take 1 tablet (81 mg total) by mouth daily. Swallow whole. ( start after stopping clopidogrel 75 mg)   bimatoprost (LUMIGAN) 0.03 % ophthalmic solution Place 1 drop into both eyes at bedtime.   Brinzolamide-Brimonidine (SIMBRINZA) 1-0.2 % SUSP Place 1 drop into both eyes in the morning and at bedtime.   furosemide (LASIX) 20 MG tablet Take daily as needed for shortness of breath, wt gain, swelling. (Patient taking differently: 20 mg daily.)   HYDROcodone bit-homatropine (HYCODAN) 5-1.5 MG/5ML syrup Take 5 mLs by mouth every 6 (six) hours as needed for cough.   ibuprofen (ADVIL) 200 MG tablet Take 400 mg by mouth every 6 (six) hours as needed for moderate pain.   irbesartan (AVAPRO) 150 MG tablet Take 1 tablet (150 mg total) by mouth daily.   isosorbide mononitrate (IMDUR) 60 MG 24 hr tablet Take one 60 mg tablet with your 30 mg tablet daily (90 mg total)   metoprolol succinate (TOPROL-XL) 25 MG 24 hr tablet TAKE 1/2 TABLET BY MOUTH EVERY DAY   nitroGLYCERIN (NITROSTAT) 0.4 MG SL tablet Place 1 tablet (0.4 mg total) under the tongue every 5 (five) minutes as needed for chest pain.   potassium chloride SA (KLOR-CON M) 20 MEQ tablet Take 10 mEq (  0.5 tablets) on days that you take lasix.   ranolazine (RANEXA) 500 MG 12 hr tablet Take 1 tablet (500 mg total) by mouth 2 (two) times daily.   traZODone (DESYREL) 50 MG tablet Take 50 mg by mouth at bedtime.   amLODipine (NORVASC) 5 MG tablet Take 1 tablet (5 mg total) by mouth daily. (Patient taking differently: Take 5 mg by mouth every evening.)   No facility-administered encounter medications on file as of 05/16/2022.    Allergies (verified) Statins and Penicillins   History: Past Medical History:  Diagnosis Date   AAA (abdominal aortic  aneurysm) (HCC)    Anemia    as infant history of   CAD S/P PTCA & DES PCI - for Progressive Angina 02/26/2019   Cath-PCI 02/26/2019: EF 55-65%. mCx 65% (FFR 0.82 - Med Rx). D2 85% - Wolverine Scoring Balloon PTCA (2.0 mm). Ost rPDA - 90% -Resolute Onyx 2.5 x 15 (2.6 mm). 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 lower back pain, s/p cervical neck surgery"stiffness" remains   Transfusion history    infant "anemia"   Past Surgical History:  Procedure Laterality Date   APPENDECTOMY  ~ [place] N/A 07/30/2018   Procedure: Cervical five-six Cervical six-seven Artificial disc replacement;  Surgeon: Kristeen Miss, MD;  Location: [facility];  Service: Neurosurgery;  Laterality: N/A;   CERVICAL LAMINECTOMY  1997   C 4 and C 5   CHOLECYSTECTOMY N/A 04/15/2014   Procedure: LAPAROSCOPIC CHOLECYSTECTOMY WITH INTRAOPERATIVE CHOLANGIOGRAM;  Surgeon: Armandina Gemma, MD;  Location: [facility];  Service: General;  Laterality: N/A;   COLONOSCOPY     CORONARY BALLOON ANGIOPLASTY N/A 02/26/2019   Procedure: CORONARY BALLOON ANGIOPLASTY;  Surgeon: Leonie Man, MD;  Location: [facility];  Service: Cardiovascular -> scoring balloon PTCA (Wolverine 2.0 mm) of ostial D2 85% reducing to 20-30%.   CORONARY STENT INTERVENTION N/A 02/26/2019   Procedure: CORONARY STENT INTERVENTION;  Surgeon: Leonie Man, MD;  Location: [place];; PCI ostial RPDA (initial attempt of PTCA only led to small local tear/dissection covered with stent) Resolute Onyx 2.5 mm  x 15 mm (2.6 mm   CORONARY STENT INTERVENTION N/A 07/05/2019   Procedure: CORONARY STENT INTERVENTION;  Surgeon: Leonie Man, MD;  Location: [facility] [facility];  Service: Cardiovascular;; ostial D1 95% (restenosis of PTCA site) -> DES PCI RESOLUTE ONYX 2.25 mm x 50 mm (2.6 mm) positioned to avoid the true ostium.    ESOPHAGOGASTRODUODENOSCOPY N/A 03/13/2014   Procedure: ESOPHAGOGASTRODUODENOSCOPY (EGD);  Surgeon: Wonda Horner, MD;  Location: [facility];  Service: Endoscopy;  Laterality: N/A;   INTRAVASCULAR PRESSURE WIRE/FFR STUDY N/A 02/26/2019   Procedure: INTRAVASCULAR PRESSURE WIRE/FFR STUDY;  Surgeon: Leonie Man, MD;  Location: [place] [facility];  Service: Cardiovascular;; mCx ~65% - DFR 0.92, FR 0.82 - BORDERLINE--> MED Rx   INTRAVASCULAR PRESSURE WIRE/FFR STUDY N/A 11/10/2021   Procedure: INTRAVASCULAR PRESSURE WIRE/FFR STUDY;  Surgeon: Leonie Man, MD;  Location: [facility];  Service: Cardiovascular;  Laterality: N/A;   LEFT HEART CATH AND CORONARY ANGIOGRAPHY N/A 02/26/2019   Procedure: LEFT HEART CATH AND CORONARY ANGIOGRAPHY;  Surgeon: Leonie Man, MD;  Location: [place] [facility];  EF 55-65%. mCx 65% (FFR 0.82 - Med Rx). D2 85% - Wolverine Scoring Balloon PTCA (2.0 mm). Ost rPDA - 90% -Resolute Onyx 2.5 x 15 (2.6 mm).    LEFT HEART CATH AND CORONARY ANGIOGRAPHY N/A 07/05/2019   Procedure: LEFT HEART CATH AND CORONARY ANGIOGRAPHY;  Surgeon: Leonie Man, MD;  Location: [facility];; ostial D1 95% (restenosis of PTCA site) -> DES PCI.  PDA stent widely patent.  Mid CX stable 65% lesion.  Proximal RCA 40%.  Mid RCA 45%.  Normal EDP.   LEFT HEART CATH AND CORONARY ANGIOGRAPHY N/A 11/10/2021   Procedure: LEFT HEART CATH AND CORONARY ANGIOGRAPHY;  Surgeon: Leonie Man, MD;  Location: [place] [facility];  Service: Cardiovascular;  Laterality: N/A;   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   SHOULDER ARTHROSCOPY Bilateral    SHOULDER  ARTHROSCOPY W/ ROTATOR CUFF REPAIR Bilateral 2004-2009   left 2004, right 2009   TOTAL HIP ARTHROPLASTY Left 03/04/2020   Procedure: TOTAL HIP ARTHROPLASTY ANTERIOR APPROACH;  Surgeon: Gaynelle Arabian, MD;  [facility];  Service: Orthopedics;  Laterality: Left;  156mn   TOTAL HIP ARTHROPLASTY Right 09/30/2020   Procedure: TOTAL HIP ARTHROPLASTY ANTERIOR APPROACH;  Surgeon: AGaynelle Arabian MD;  [facility];  Service: Orthopedics;  Laterality: Right;  1062m   TRANSTHORACIC ECHOCARDIOGRAM  02/2020; 10/01/2020   a) EF 55 to 60%.  GR 1 DD.  No R WMA.  Trivial MR.  Otherwise normal.; b) EF 55 to 60%.  Normal LV function and no wall motion abnormality.  Normal RV.  Normal valves.. [name] VASECTOMY     WISDOM TOOTH EXTRACTION     Family History  Problem Relation Age of Onset   Hypertension Mother    Hypertension Father    Arthritis Sister        rheumatoid   Stroke Sister 606 Social History   Socioeconomic History   Marital status: Married    Spouse name: Not on file   Number of children: Not on file   Years of education: Not on file   Highest education level: Not on file  Occupational History   Not on file  Tobacco Use   Smoking status: Former    Packs/day: 1.00    Years: 5.00    Total pack years: 5.00    Types: Cigarettes    Quit date: 10/17/1968    Years since quitting: 53.6   Smokeless tobacco: Never  Vaping Use   Vaping Use: Never used  Substance and Sexual Activity   Alcohol use: Not Currently    Comment: "quit drinking ~ 10/1968   Drug use: No   Sexual activity: Not on file  Other Topics Concern   Not on file  Social History Narrative   Not on file   Social Determinants of Health   Financial Resource Strain: Low Risk  (05/16/2022)   Overall Financial Resource Strain (CARDIA)    Difficulty of Paying Living Expenses: Not hard at all  Food Insecurity: No Food Insecurity (05/16/2022)   Hunger Vital Sign    Worried About Running Out of Food in the Last Year: Never  true    Ran Out  of Food in the Last Year: Never true  Transportation Needs: No Transportation Needs (05/16/2022)   PRAPARE - Hydrologist (Medical): No    Lack of Transportation (Non-Medical): No  Physical Activity: Inactive (05/16/2022)   Exercise Vital Sign    Days of Exercise per Week: 0 days    Minutes of Exercise per Session: 0 min  Stress: No Stress Concern Present (05/16/2022)   [place]    Feeling of Stress : Not at all  Social Connections: Moderately Isolated (05/07/2021)   Social Connection and Isolation Panel [NHANES]    Frequency of Communication with Friends and Family: Three times a week    Frequency of Social Gatherings with Friends and Family: Three times a week    Attends Religious Services: Never    Active Member of Clubs or Organizations: No    Attends Archivist Meetings: Never    Marital Status: Married    Tobacco Counseling Counseling given: Not Answered   Clinical Intake:  Pre-visit preparation completed: Yes  Pain : No/denies pain     Nutritional Status: BMI of 19-24  Normal Nutritional Risks: None Diabetes: No  How often do you need to have someone help you when you read instructions, pamphlets, or other written materials from your doctor or pharmacy?: 1 - Never What is the last grade level you completed in school?: college  Diabetic? no  Interpreter Needed?: No  Information entered by :: NAllen LPN   Activities of Daily Living    05/16/2022    8:07 AM 11/12/2021   12:04 AM  In your present state of health, do you have any difficulty performing the following activities:  Hearing? 1 1  Comment have hearing aids but does not wear   Vision? 1 0  Comment sometimes glaucoma and cataracts   Difficulty concentrating or making decisions? 0 0  Walking or climbing stairs? 0 0  Dressing or bathing? 0 0  Doing errands, shopping? 0 1  Preparing  Food and eating ? N   Using the Toilet? N   In the past six months, have you accidently leaked urine? N   Do you have problems with loss of bowel control? N   Managing your Medications? N   Managing your Finances? N   Housekeeping or managing your Housekeeping? N     Patient Care Team: Eulas Post, MD as PCP - General Leonie Man, MD as PCP - Cardiology (Cardiology) Kristeen Miss, MD as Consulting Physician (Neurosurgery) Garald Balding, MD as Consulting Physician (Orthopedic Surgery) Viona Gilmore, [facility] as Pharmacist (Pharmacist)  Indicate any recent Medical Services you may have received from other than [facility] providers in the past year (date may be approximate).     Assessment:   This is a routine wellness examination for Judah.  Hearing/Vision screen Vision Screening - Comments:: Regular eye exams, [facility] and [place]  Dietary issues and exercise activities discussed: Current Exercise Habits: The patient does not participate in regular exercise at present   Goals Addressed             This Visit's Progress    Patient Stated       05/16/2022, keep bp in check and keep weight in check       Depression Screen    05/16/2022    8:06 AM 05/07/2021    8:25 AM 05/15/2020    8:25 AM 05/06/2020  7:58 AM 05/06/2019    8:25 AM 04/04/2019    8:21 AM 05/02/2018    8:19 AM  PHQ 2/9 Scores  PHQ - 2 Score 0 0 0 0 0 0 0  PHQ- 9 Score   0 4 0      Fall Risk    05/16/2022    8:06 AM 05/11/2022   11:25 AM 05/07/2021    8:28 AM 05/15/2020    8:24 AM 05/06/2020    7:58 AM  Fall Risk   Falls in the past year? 0 0 0 0 0  Number falls in past yr: 0  0 0 0  Injury with Fall? 0   0 0  Risk for fall due to : Medication side effect  No Fall Risks    Follow up Falls prevention discussed;Education provided;Falls evaluation completed  Falls evaluation completed Falls evaluation completed;Falls prevention discussed     FALL RISK PREVENTION PERTAINING TO THE  HOME:  Any stairs in or around the home? No  If so, are there any without handrails? N/a Home free of loose throw rugs in walkways, pet beds, electrical cords, etc? Yes  Adequate lighting in your home to reduce risk of falls? Yes   ASSISTIVE DEVICES UTILIZED TO PREVENT FALLS:  Life alert? No  Use of a cane, walker or w/c? No  Grab bars in the bathroom? Yes  Shower chair or bench in shower? No  Elevated toilet seat or a handicapped toilet? No   TIMED UP AND GO:  Was the test performed? No .      Cognitive Function:        05/16/2022    8:08 AM  6CIT Screen  What Year? 0 points  What month? 0 points  What time? 0 points  Count back from 20 0 points  Months in reverse 0 points  Repeat phrase 2 points  Total Score 2 points    Immunizations Immunization History  Administered Date(s) Administered   Fluad Quad(high Dose 65+) 05/06/2019, 05/06/2020, 05/18/2021   Influenza Split 05/15/2012   Influenza Whole 06/15/2010   Influenza, High Dose Seasonal PF 04/15/2016, 04/18/2017, 05/02/2018   Influenza,inj,Quad PF,6+ Mos 08/02/2013, 05/05/2014   Influenza,inj,quad, With Preservative 05/16/2019   Influenza-Unspecified 07/30/2015   PFIZER Comirnaty(Gray Top)Covid-19 Tri-Sucrose Vaccine 12/10/2020   PFIZER(Purple Top)SARS-COV-2 Vaccination 08/30/2019, 09/20/2019, 06/13/2020   Pfizer Covid-19 Vaccine Bivalent Booster 13yr & up 09/01/2021   Pneumococcal Conjugate-13 09/27/2013   Pneumococcal Polysaccharide-23 09/15/2008, 04/16/2015   Td 08/16/2003   Tdap 05/05/2014   Zoster Recombinat (Shingrix) 10/14/2018, 10/14/2018, 03/20/2019, 03/20/2019   Zoster, Live 06/17/2011    TDAP status: Up to date  Flu Vaccine status: Due, Education has been provided regarding the importance of this vaccine. Advised may receive this vaccine at local pharmacy or Health Dept. Aware to provide a copy of the vaccination record if obtained from local pharmacy or Health Dept. Verbalized acceptance  and understanding.  Pneumococcal vaccine status: Up to date  Covid-19 vaccine status: Completed vaccines  Qualifies for Shingles Vaccine? Yes   Zostavax completed Yes   Shingrix Completed?: Yes  Screening Tests Health Maintenance  Topic Date Due   Diabetic kidney evaluation - Urine ACR  08/19/2017   FOOT EXAM  05/03/2019   COVID-19 Vaccine (8 - Mixed Product risk series) 10/27/2021   HEMOGLOBIN A1C  11/16/2021   OPHTHALMOLOGY EXAM  01/20/2022   INFLUENZA VACCINE  03/15/2022   Diabetic kidney evaluation - GFR measurement  12/23/2022   TETANUS/TDAP  09/17/2025  Pneumonia Vaccine 64+ Years old  Completed   Hepatitis C Screening  Completed   Zoster Vaccines- Shingrix  Completed   HPV VACCINES  Aged Out   COLONOSCOPY (Pts 45-37yr Insurance coverage will need to be confirmed)  Discontinued    Health Maintenance  Health Maintenance Due  Topic Date Due   Diabetic kidney evaluation - Urine ACR  08/19/2017   FOOT EXAM  05/03/2019   COVID-19 Vaccine (8 - Mixed Product risk series) 10/27/2021   HEMOGLOBIN A1C  11/16/2021   OPHTHALMOLOGY EXAM  01/20/2022   INFLUENZA VACCINE  03/15/2022    Colorectal cancer screening: No longer required.   Lung Cancer Screening: (Low Dose CT Chest recommended if Age 76-80years, 30 pack-year currently smoking OR have quit w/in 15years.) does not qualify.   Lung Cancer Screening Referral: no  Additional Screening:  Hepatitis C Screening: does qualify; Completed 02/26/2014  Vision Screening: Recommended annual ophthalmology exams for early detection of glaucoma and other disorders of the eye. Is the patient up to date with their annual eye exam?  Yes  Who is the provider or what is the name of the office in which the patient attends annual eye exams? [facility] If pt is not established with a provider, would they like to be referred to a provider to establish care? No .   Dental Screening: Recommended annual dental exams for proper  oral hygiene  Community Resource Referral / Chronic Care Management: CRR required this visit?  No   CCM required this visit?  No      Plan:     I have personally reviewed and noted the following in the patient's chart:   Medical and social history Use of alcohol, tobacco or illicit drugs  Current medications and supplements including opioid prescriptions. Patient is not currently taking opioid prescriptions. Functional ability and status Nutritional status Physical activity Advanced directives List of other physicians Hospitalizations, surgeries, and ER visits in previous 12 months Vitals Screenings to include cognitive, depression, and falls Referrals and appointments  In addition, I have reviewed and discussed with patient certain preventive protocols, quality metrics, and best practice recommendations. A written personalized care plan for preventive services as well as general preventive health recommendations were provided to patient.     NKellie Simmering LPN   103/0/0923  Nurse Notes: none  Due to this being a virtual visit, the after visit summary with patients personalized plan was offered to patient via mail or my-chart. Patient would like to access on my-chart

## 2022-05-16 NOTE — Patient Instructions (Signed)
[name] , Thank you for taking time to come for your Medicare Wellness Visit. I appreciate your ongoing commitment to your health goals. Please review the following plan we discussed and let me know if I can assist you in the future.   Screening recommendations/referrals: Colonoscopy: not required Recommended yearly ophthalmology/optometry visit for glaucoma screening and checkup Recommended yearly dental visit for hygiene and checkup  Vaccinations: Influenza vaccine: due Pneumococcal vaccine: completed 04/16/2015 Tdap vaccine: completed 09/18/2015, due 09/17/2025 Shingles vaccine: completed   Covid-19:  09/01/2021, 12/10/2020, 06/13/2020, 09/20/2019, 08/30/2019  Advanced directives: Please bring a copy of your POA (Power of Attorney) and/or Living Will to your next appointment.   Conditions/risks identified: none  Next appointment: Follow up in one year for your annual wellness visit.   Preventive Care 16 Years and Older, Male Preventive care refers to lifestyle choices and visits with your health care provider that can promote health and wellness. What does preventive care include? A yearly physical exam. This is also called an annual well check. Dental exams once or twice a year. Routine eye exams. Ask your health care provider how often you should have your eyes checked. Personal lifestyle choices, including: Daily care of your teeth and gums. Regular physical activity. Eating a healthy diet. Avoiding tobacco and drug use. Limiting alcohol use. Practicing safe sex. Taking low doses of aspirin every day. Taking vitamin and mineral supplements as recommended by your health care provider. What happens during an annual well check? The services and screenings done by your health care provider during your annual well check will depend on your age, overall health, lifestyle risk factors, and family history of disease. Counseling  Your health care provider may ask you questions about  your: Alcohol use. Tobacco use. Drug use. Emotional well-being. Home and relationship well-being. Sexual activity. Eating habits. History of falls. Memory and ability to understand (cognition). Work and work Statistician. Screening  You may have the following tests or measurements: Height, weight, and BMI. Blood pressure. Lipid and cholesterol levels. These may be checked every 5 years, or more frequently if you are over 58 years old. Skin check. Lung cancer screening. You may have this screening every year starting at age 28 if you have a 30-pack-year history of smoking and currently smoke or have quit within the past 15 years. Fecal occult blood test (FOBT) of the stool. You may have this test every year starting at age 51. Flexible sigmoidoscopy or colonoscopy. You may have a sigmoidoscopy every 5 years or a colonoscopy every 10 years starting at age 67. Prostate cancer screening. Recommendations will vary depending on your family history and other risks. Hepatitis C blood test. Hepatitis B blood test. Sexually transmitted disease (STD) testing. Diabetes screening. This is done by checking your blood sugar (glucose) after you have not eaten for a while (fasting). You may have this done every 1-3 years. Abdominal aortic aneurysm (AAA) screening. You may need this if you are a current or former smoker. Osteoporosis. You may be screened starting at age 44 if you are at high risk. Talk with your health care provider about your test results, treatment options, and if necessary, the need for more tests. Vaccines  Your health care provider may recommend certain vaccines, such as: Influenza vaccine. This is recommended every year. Tetanus, diphtheria, and acellular pertussis (Tdap, Td) vaccine. You may need a Td booster every 10 years. Zoster vaccine. You may need this after age 70. Pneumococcal 13-valent conjugate (PCV13) vaccine. One dose is  recommended after age 66. Pneumococcal  polysaccharide (PPSV23) vaccine. One dose is recommended after age 43. Talk to your health care provider about which screenings and vaccines you need and how often you need them. This information is not intended to replace advice given to you by your health care provider. Make sure you discuss any questions you have with your health care provider. Document Released: 08/28/2015 Document Revised: 04/20/2016 Document Reviewed: 06/02/2015 Elsevier Interactive Patient Education  2017 [place] Prevention in the Home Falls can cause injuries. They can happen to people of all ages. There are many things you can do to make your home safe and to help prevent falls. What can I do on the outside of my home? Regularly fix the edges of walkways and driveways and fix any cracks. Remove anything that might make you trip as you walk through a door, such as a raised step or threshold. Trim any bushes or trees on the path to your home. Use bright outdoor lighting. Clear any walking paths of anything that might make someone trip, such as rocks or tools. Regularly check to see if handrails are loose or broken. Make sure that both sides of any steps have handrails. Any raised decks and porches should have guardrails on the edges. Have any leaves, snow, or ice cleared regularly. Use sand or salt on walking paths during winter. Clean up any spills in your garage right away. This includes oil or grease spills. What can I do in the bathroom? Use night lights. Install grab bars by the toilet and in the tub and shower. Do not use towel bars as grab bars. Use non-skid mats or decals in the tub or shower. If you need to sit down in the shower, use a plastic, non-slip stool. Keep the floor dry. Clean up any water that spills on the floor as soon as it happens. Remove soap buildup in the tub or shower regularly. Attach bath mats securely with double-sided non-slip rug tape. Do not have throw rugs and other  things on the floor that can make you trip. What can I do in the bedroom? Use night lights. Make sure that you have a light by your bed that is easy to reach. Do not use any sheets or blankets that are too big for your bed. They should not hang down onto the floor. Have a firm chair that has side arms. You can use this for support while you get dressed. Do not have throw rugs and other things on the floor that can make you trip. What can I do in the kitchen? Clean up any spills right away. Avoid walking on wet floors. Keep items that you use a lot in easy-to-reach places. If you need to reach something above you, use a strong step stool that has a grab bar. Keep electrical cords out of the way. Do not use floor polish or wax that makes floors slippery. If you must use wax, use non-skid floor wax. Do not have throw rugs and other things on the floor that can make you trip. What can I do with my stairs? Do not leave any items on the stairs. Make sure that there are handrails on both sides of the stairs and use them. Fix handrails that are broken or loose. Make sure that handrails are as long as the stairways. Check any carpeting to make sure that it is firmly attached to the stairs. Fix any carpet that is loose or worn. Avoid having  throw rugs at the top or bottom of the stairs. If you do have throw rugs, attach them to the floor with carpet tape. Make sure that you have a light switch at the top of the stairs and the bottom of the stairs. If you do not have them, ask someone to add them for you. What else can I do to help prevent falls? Wear shoes that: Do not have high heels. Have rubber bottoms. Are comfortable and fit you well. Are closed at the toe. Do not wear sandals. If you use a stepladder: Make sure that it is fully opened. Do not climb a closed stepladder. Make sure that both sides of the stepladder are locked into place. Ask someone to hold it for you, if possible. Clearly  mark and make sure that you can see: Any grab bars or handrails. First and last steps. Where the edge of each step is. Use tools that help you move around (mobility aids) if they are needed. These include: Canes. Walkers. Scooters. Crutches. Turn on the lights when you go into a dark area. Replace any light bulbs as soon as they burn out. Set up your furniture so you have a clear path. Avoid moving your furniture around. If any of your floors are uneven, fix them. If there are any pets around you, be aware of where they are. Review your medicines with your doctor. Some medicines can make you feel dizzy. This can increase your chance of falling. Ask your doctor what other things that you can do to help prevent falls. This information is not intended to replace advice given to you by your health care provider. Make sure you discuss any questions you have with your health care provider. Document Released: 05/28/2009 Document Revised: 01/07/2016 Document Reviewed: 09/05/2014 Elsevier Interactive Patient Education  2017 [employer].

## 2022-05-20 ENCOUNTER — Ambulatory Visit (INDEPENDENT_AMBULATORY_CARE_PROVIDER_SITE_OTHER): Payer: No Typology Code available for payment source | Admitting: Family Medicine

## 2022-05-20 ENCOUNTER — Encounter: Payer: Self-pay | Admitting: Family Medicine

## 2022-05-20 VITALS — BP 114/60 | HR 67 | Temp 97.9°F | Ht 72.05 in | Wt 189.5 lb

## 2022-05-20 DIAGNOSIS — I10 Essential (primary) hypertension: Secondary | ICD-10-CM | POA: Diagnosis not present

## 2022-05-20 DIAGNOSIS — I25119 Atherosclerotic heart disease of native coronary artery with unspecified angina pectoris: Secondary | ICD-10-CM | POA: Diagnosis not present

## 2022-05-20 DIAGNOSIS — R739 Hyperglycemia, unspecified: Secondary | ICD-10-CM

## 2022-05-20 DIAGNOSIS — E785 Hyperlipidemia, unspecified: Secondary | ICD-10-CM | POA: Diagnosis not present

## 2022-05-20 DIAGNOSIS — Z23 Encounter for immunization: Secondary | ICD-10-CM

## 2022-05-20 DIAGNOSIS — Z0000 Encounter for general adult medical examination without abnormal findings: Secondary | ICD-10-CM

## 2022-05-20 LAB — COMPREHENSIVE METABOLIC PANEL
ALT: 17 U/L (ref 0–53)
AST: 21 U/L (ref 0–37)
Albumin: 4.4 g/dL (ref 3.5–5.2)
Alkaline Phosphatase: 105 U/L (ref 39–117)
BUN: 15 mg/dL (ref 6–23)
CO2: 31 mEq/L (ref 19–32)
Calcium: 9.3 mg/dL (ref 8.4–10.5)
Chloride: 103 mEq/L (ref 96–112)
Creatinine, Ser: 1.19 mg/dL (ref 0.40–1.50)
GFR: 59.48 mL/min — ABNORMAL LOW (ref >60.00)
Glucose, Bld: 125 mg/dL — ABNORMAL HIGH (ref 70–99)
Potassium: 3.7 mEq/L (ref 3.5–5.1)
Sodium: 140 mEq/L (ref 135–145)
Total Bilirubin: 0.8 mg/dL (ref 0.2–1.2)
Total Protein: 7 g/dL (ref 6.0–8.3)

## 2022-05-20 LAB — CBC WITH DIFFERENTIAL/PLATELET
Basophils Absolute: 0.1 10*3/uL (ref 0.0–0.1)
Basophils Relative: 1.2 % (ref 0.0–3.0)
Eosinophils Absolute: 0.1 10*3/uL (ref 0.0–0.7)
Eosinophils Relative: 0.8 % (ref 0.0–5.0)
HCT: 44.9 % (ref 39.0–52.0)
Hemoglobin: 15.5 g/dL (ref 13.0–17.0)
Lymphocytes Relative: 22.4 % (ref 12.0–46.0)
Lymphs Abs: 1.8 10*3/uL (ref 0.7–4.0)
MCHC: 34.6 g/dL (ref 30.0–36.0)
MCV: 93 fl (ref 78.0–100.0)
Monocytes Absolute: 0.8 10*3/uL (ref 0.1–1.0)
Monocytes Relative: 9.7 % (ref 3.0–12.0)
Neutro Abs: 5.4 10*3/uL (ref 1.4–7.7)
Neutrophils Relative %: 65.9 % (ref 43.0–77.0)
Platelets: 211 10*3/uL (ref 150.0–400.0)
RBC: 4.82 Mil/uL (ref 4.22–5.81)
RDW: 13.1 % (ref 11.5–15.5)
WBC: 8.2 10*3/uL (ref 4.0–10.5)

## 2022-05-20 LAB — LIPID PANEL
Cholesterol: 144 mg/dL (ref 0–200)
HDL: 41.9 mg/dL (ref >39.00)
LDL Cholesterol: 73 mg/dL (ref 0–99)
NonHDL: 102.22
Total CHOL/HDL Ratio: 3
Triglycerides: 148 mg/dL (ref 0.0–149.0)
VLDL: 29.6 mg/dL (ref 0.0–40.0)

## 2022-05-20 LAB — HEMOGLOBIN A1C: Hgb A1c MFr Bld: 5.9 % (ref 4.6–6.5)

## 2022-05-20 NOTE — Progress Notes (Signed)
Established Patient Office Visit  Subjective   Patient ID: Luis Guzman, male    DOB: 05-13-1946  Age: 76 y.o. MRN: [id number]  Chief Complaint  Patient presents with   Annual Exam    HPI   Seen today for physical exam.  He has history of CAD, hypertension, hyperlipidemia, past history of pancreatitis, osteoarthritis involving multiple joints, history of prediabetes range blood sugars.  He had some progressive dyspnea and chest fullness during the past year.  Ended up getting catheterization back in March which showed patent stents and no acute findings.  Addition of Ranexa at that time.  Stays active with things like mowing and yard work.  Still has some limitation with activities but overall stable.  Health maintenance reviewed  -Needs flu vaccine -No further colonoscopies -Shingrix already completed -Vaccines complete -Tetanus is due 2027  Family history-both parents had hypertension.  He had a sister with history of stroke at age 76.  Sister with history of rheumatoid arthritis  Social history-he is married.  Retired.  Smoking 1970.  No regular alcohol use.  Past Medical History:  Diagnosis Date   AAA (abdominal aortic aneurysm) ([place])    Anemia    as infant history of   CAD S/P PTCA & DES PCI - for Progressive Angina 02/26/2019   Cath-PCI 02/26/2019: EF 55-65%. mCx 65% (FFR 0.82 - Med Rx). D2 85% - Wolverine Scoring Balloon PTCA (2.0 mm). Ost rPDA - 90% -Resolute Onyx 2.5 x 15 (2.6 mm). 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 lower back pain, s/p cervical  neck surgery"stiffness" remains   Transfusion history    infant "anemia"   Past Surgical History:  Procedure Laterality Date   APPENDECTOMY  ~ [place] N/A 07/30/2018   Procedure: Cervical five-six Cervical six-seven Artificial disc replacement;  Surgeon: Kristeen Miss, MD;  Location: [facility];  Service: Neurosurgery;  Laterality: N/A;   CERVICAL LAMINECTOMY  1997   C 4 and C 5   CHOLECYSTECTOMY N/A 04/15/2014   Procedure: LAPAROSCOPIC CHOLECYSTECTOMY WITH INTRAOPERATIVE CHOLANGIOGRAM;  Surgeon: Armandina Gemma, MD;  Location: [facility];  Service: General;  Laterality: N/A;   COLONOSCOPY     CORONARY BALLOON ANGIOPLASTY N/A 02/26/2019   Procedure: CORONARY BALLOON ANGIOPLASTY;  Surgeon: Leonie Man, MD;  Location: [facility];  Service: Cardiovascular -> scoring balloon PTCA (Wolverine 2.0 mm) of ostial D2 85% reducing to 20-30%.   CORONARY STENT INTERVENTION N/A 02/26/2019   Procedure: CORONARY STENT INTERVENTION;  Surgeon: Leonie Man, MD;  Location: [facility] [facility] [facility];; PCI ostial RPDA (initial attempt of PTCA only led to small local tear/dissection covered with stent) Resolute Onyx 2.5 mm x 15 mm (2.6 mm   CORONARY STENT INTERVENTION N/A 07/05/2019   Procedure: CORONARY STENT INTERVENTION;  Surgeon: Leonie Man, MD;  Location: [facility];  Service: Cardiovascular;; ostial D1 95% (restenosis of PTCA site) -> DES PCI RESOLUTE ONYX 2.25 mm  x 50 mm (2.6 mm) positioned to avoid the true ostium.    ESOPHAGOGASTRODUODENOSCOPY N/A 03/13/2014   Procedure: ESOPHAGOGASTRODUODENOSCOPY (EGD);  Surgeon: Wonda Horner, MD;  Location: [facility];  Service: Endoscopy;  Laterality: N/A;   INTRAVASCULAR PRESSURE WIRE/FFR STUDY N/A 02/26/2019   Procedure: INTRAVASCULAR PRESSURE WIRE/FFR STUDY;  Surgeon: Leonie Man, MD;  Location: [facility];  Service: Cardiovascular;; mCx ~65% - DFR 0.92, FR 0.82 - BORDERLINE--> MED Rx   INTRAVASCULAR  PRESSURE WIRE/FFR STUDY N/A 11/10/2021   Procedure: INTRAVASCULAR PRESSURE WIRE/FFR STUDY;  Surgeon: Leonie Man, MD;  Location: [facility];  Service: Cardiovascular;  Laterality: N/A;   LEFT HEART CATH AND CORONARY ANGIOGRAPHY N/A 02/26/2019   Procedure: LEFT HEART CATH AND CORONARY ANGIOGRAPHY;  Surgeon: Leonie Man, MD;  Location: [place] [facility];  EF 55-65%. mCx 65% (FFR 0.82 - Med Rx). D2 85% - Wolverine Scoring Balloon PTCA (2.0 mm). Ost rPDA - 90% -Resolute Onyx 2.5 x 15 (2.6 mm).    LEFT HEART CATH AND CORONARY ANGIOGRAPHY N/A 07/05/2019   Procedure: LEFT HEART CATH AND CORONARY ANGIOGRAPHY;  Surgeon: Leonie Man, MD;  Location: [facility];; ostial D1 95% (restenosis of PTCA site) -> DES PCI.  PDA stent widely patent.  Mid CX stable 65% lesion.  Proximal RCA 40%.  Mid RCA 45%.  Normal EDP.   LEFT HEART CATH AND CORONARY ANGIOGRAPHY N/A 11/10/2021   Procedure: LEFT HEART CATH AND CORONARY ANGIOGRAPHY;  Surgeon: Leonie Man, MD;  Location: [facility];  Service: Cardiovascular;  Laterality: N/A;   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   SHOULDER ARTHROSCOPY Bilateral    SHOULDER ARTHROSCOPY W/ ROTATOR CUFF REPAIR Bilateral 2004-2009   left 2004, right 2009   TOTAL HIP ARTHROPLASTY Left 03/04/2020   Procedure: TOTAL HIP ARTHROPLASTY ANTERIOR APPROACH;  Surgeon: Gaynelle Arabian, MD;  [facility];  Service: Orthopedics;  Laterality: Left;  14mn   TOTAL HIP ARTHROPLASTY Right 09/30/2020   Procedure: TOTAL HIP ARTHROPLASTY ANTERIOR APPROACH;  Surgeon: AGaynelle Arabian MD;  [facility];  Service: Orthopedics;  Laterality: Right;  1027m   TRANSTHORACIC ECHOCARDIOGRAM  02/2020; 10/01/2020   a) EF 55 to 60%.  GR 1 DD.  No R WMA.  Trivial MR.  Otherwise normal.; b) EF 55 to 60%.  Normal LV function and no wall motion abnormality.  Normal RV.  Normal valves.. [name] VASECTOMY     WISDOM TOOTH EXTRACTION      reports that he quit smoking  about 53 years ago. His smoking use included cigarettes. He has a 5.00 pack-year smoking history. He has never used smokeless tobacco. He reports that he does not currently use alcohol. He reports that he does not use drugs. family history includes Arthritis in his sister; Hypertension in his father and mother; Stroke (age of onset: 6033in his sister. Allergies  Allergen Reactions   Statins     Muscle pain   Penicillins Itching    Has patient had a PCN reaction causing immediate rash, facial/tongue/throat swelling, SOB or lightheadedness with hypotension: no Has patient had a PCN reaction causing severe rash involving mucus membranes or skin necrosis: unkn Has patient had a PCN reaction that required hospitalization: no Has patient had a PCN reaction occurring within the last 10 years: no If all of the above answers are "NO", then may proceed with Cephalosporin use.  Tolerated Cephalosporin Date: 10/01/20.  Review of Systems  Constitutional:  Negative for chills, fever, malaise/fatigue and weight loss.  HENT:  Negative for hearing loss.   Eyes:  Negative for blurred vision and double vision.  Respiratory:  Negative for cough and shortness of breath.   Cardiovascular:  Negative for chest pain, palpitations and leg swelling.  Gastrointestinal:  Negative for abdominal pain, blood in stool, constipation and diarrhea.  Genitourinary:  Negative for dysuria.  Skin:  Negative for rash.  Neurological:  Negative for dizziness, speech change, seizures, loss of consciousness and headaches.  Psychiatric/Behavioral:  Negative for depression.       Objective:     BP 114/60 (BP Location: Left Arm, Patient Position: Sitting, Cuff Size: Normal)   Pulse 67   Temp 97.9 F (36.6 C) (Oral)   Ht 6' 0.05" (1.83 m)   Wt 189 lb 8 oz (86 kg)   SpO2 98%   BMI 25.67 kg/m  BP Readings from Last 3 Encounters:  05/20/22 114/60  03/21/22 (!) 141/75  02/04/22 111/66   Wt Readings from Last 3  Encounters:  05/20/22 189 lb 8 oz (86 kg)  05/16/22 186 lb (84.4 kg)  03/21/22 186 lb (84.4 kg)      Physical Exam Vitals reviewed.  Constitutional:      General: He is not in acute distress.    Appearance: He is well-developed.  HENT:     Head: Normocephalic and atraumatic.     Right Ear: External ear normal.     Left Ear: External ear normal.  Eyes:     Conjunctiva/sclera: Conjunctivae normal.     Pupils: Pupils are equal, round, and reactive to light.  Neck:     Thyroid: No thyromegaly.  Cardiovascular:     Rate and Rhythm: Normal rate and regular rhythm.     Heart sounds: Normal heart sounds. No murmur heard. Pulmonary:     Effort: No respiratory distress.     Breath sounds: No wheezing or rales.  Abdominal:     General: Bowel sounds are normal. There is no distension.     Palpations: Abdomen is soft. There is no mass.     Tenderness: There is no abdominal tenderness. There is no guarding or rebound.  Musculoskeletal:     Cervical back: Normal range of motion and neck supple.     Right lower leg: No edema.     Left lower leg: No edema.  Lymphadenopathy:     Cervical: No cervical adenopathy.  Skin:    Findings: No rash.  Neurological:     Mental Status: He is alert and oriented to person, place, and time.     Cranial Nerves: No cranial nerve deficit.     Deep Tendon Reflexes: Reflexes normal.      No results found for any visits on 05/20/22.    The ASCVD Risk score (Arnett DK, et al., 2019) failed to calculate for the following reasons:   The valid total cholesterol range is 130 to 320 mg/dL    Assessment & Plan:   Problem List Items Addressed This Visit       Unprioritized   Hyperlipidemia with target LDL less than 70 - statin intolerant (Chronic)   Relevant Orders   Lipid panel   Essential hypertension - Primary (Chronic)   Relevant Orders   CBC with Differential/Platelet   CMP   Coronary artery disease involving native coronary artery of  native heart with angina pectoris (HCC) (Chronic)   Relevant Orders   CBC  with Differential/Platelet   Hyperglycemia   Relevant Orders   Hemoglobin A1c  -Influenza vaccine given -Check labs as above -Continue regular exercise habits.  Goal of minimum 150 minutes/week of moderate intensity exercise  No follow-ups on file.    Carolann Littler, MD

## 2022-05-20 NOTE — Addendum Note (Signed)
Addended by: Nilda Riggs on: 05/20/2022 09:37 AM   Modules accepted: Orders

## 2022-05-23 ENCOUNTER — Ambulatory Visit: Payer: Medicare Other | Attending: Cardiology | Admitting: Cardiology

## 2022-05-23 ENCOUNTER — Encounter: Payer: Self-pay | Admitting: Cardiology

## 2022-05-23 VITALS — BP 120/68 | HR 73 | Ht 73.0 in | Wt 190.8 lb

## 2022-05-23 DIAGNOSIS — E785 Hyperlipidemia, unspecified: Secondary | ICD-10-CM | POA: Diagnosis not present

## 2022-05-23 DIAGNOSIS — I2510 Atherosclerotic heart disease of native coronary artery without angina pectoris: Secondary | ICD-10-CM

## 2022-05-23 DIAGNOSIS — I2089 Other forms of angina pectoris: Secondary | ICD-10-CM

## 2022-05-23 DIAGNOSIS — I10 Essential (primary) hypertension: Secondary | ICD-10-CM

## 2022-05-23 DIAGNOSIS — R5383 Other fatigue: Secondary | ICD-10-CM | POA: Diagnosis not present

## 2022-05-23 DIAGNOSIS — T466X5A Adverse effect of antihyperlipidemic and antiarteriosclerotic drugs, initial encounter: Secondary | ICD-10-CM

## 2022-05-23 DIAGNOSIS — I25119 Atherosclerotic heart disease of native coronary artery with unspecified angina pectoris: Secondary | ICD-10-CM | POA: Diagnosis not present

## 2022-05-23 DIAGNOSIS — I7143 Infrarenal abdominal aortic aneurysm, without rupture: Secondary | ICD-10-CM

## 2022-05-23 DIAGNOSIS — M791 Myalgia, unspecified site: Secondary | ICD-10-CM

## 2022-05-23 MED ORDER — FUROSEMIDE 20 MG PO TABS: ORAL_TABLET | ORAL | 3 refills | Status: DC

## 2022-05-23 MED ORDER — RANOLAZINE ER 500 MG PO TB12: ORAL_TABLET | ORAL | 3 refills | Status: DC

## 2022-05-23 MED ORDER — ISOSORBIDE MONONITRATE ER 60 MG PO TB24: 60.0000 mg | ORAL_TABLET | Freq: Every day | ORAL | 3 refills | Status: DC

## 2022-05-23 NOTE — Patient Instructions (Addendum)
Medication Instructions:    Changes  Ranexa 1000 mg ( 2 tab) in the morning   500 mg  daily   Decrease Imdur to 60 mg daily    Lasix   *If you need a refill on your cardiac medications before your next appointment, please call your pharmacy*   Lab [facility] April 2024 CMP Lipid  If you have labs (blood work) drawn today and your tests are completely normal, you will receive your results only by: [facility] (if you have MyChart) OR A paper copy in the mail If you have any lab test that is abnormal or we need to change your treatment, we will call you to review the results.   Testing/Procedures:  WILL BE SCHEDULE IN APRIL 2024 at [facility] has requested that you have an abdominal aorta duplex. During this test, an ultrasound is used to evaluate the aorta. Allow 30 minutes for this exam. Do not eat after midnight the day before and avoid carbonated beverages   Follow-Up: At [facility], you and your health needs are our priority.  As part of our continuing mission to provide you with exceptional heart care, we have created designated Provider Care Teams.  These Care Teams include your primary Cardiologist (physician) and Advanced Practice Providers (APPs -  Physician Assistants and Nurse Practitioners) who all work together to provide you with the care you need, when you need it.     Your next appointment:    6 TO 7 month(s)  The format for your next appointment:   In Person  Provider:   Glenetta Hew, MD    Other Instructions

## 2022-05-23 NOTE — Progress Notes (Signed)
Primary Care Provider: Eulas Post, MD [place] Cardiologist: Glenetta Hew, MD Electrophysiologist: None  Clinic Note: No chief complaint on file.  ===================================  ASSESSMENT/PLAN   Problem List Items Addressed This Visit       Cardiology Problems   Atypical angina (Chronic)     patient gaining at this point, he is not really having what sounds like true angina symptoms he has these were chest discomfort symptoms with or without exertion and we just completed a full evaluation with a Myoview that led to heart catheterization that did not show anything significant to work on.  He is very reluctant to go back to the Cath Lab.  (Has not had good luck with access site issues both radial and femoral)  Not sure what to make of his symptoms, but if there very well could be some microvascular component.  His stents are widely patent on cath and he does not have much other macrovascular disease.  Plan: Reduce Imdur to 60 mg daily and increase Ranexa to 1000 mg in the morning and 500 at night.--If he does take nitroglycerin, then for that they would take him back to 30 mg Imdur. 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  This was confirmed by cardiac cath showing stable widely patent coronaries.  Plan:  Continue amlodipine and Toprol at current dose. To avoid tachyphylaxis, we will reduce Imdur to 60 mg daily (using the 30 mg tablet as needed if he has had to use sublingual NTG) Also on  irbesartan for blood pressure control; Praluent for lipids. On aspirin; will had to stop Plavix because of bruising.       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  Most recent labs did not look as good as we would like him to be with LDL of 73, but in light of all of the symptoms, will hold off on further adjustments.      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      Relevant Medications   furosemide (LASIX) 20 MG tablet   isosorbide mononitrate (IMDUR) 60 MG 24 hr tablet   ranolazine (RANEXA) 500 MG 12 hr tablet   Other Relevant Orders   EKG 12-Lead (  Completed)   Lipid panel   Comprehensive metabolic panel   VAS [place] AAA DUPLEX   Essential hypertension (Chronic)    Blood pressure pretty well controlled on current meds. Amlodipine 10 mg daily, irbesartan 150 mg daily, Toprol 12.5 mg daily and Imdur that we are reducing to 60 mg daily.      Relevant Medications   furosemide (LASIX) 20 MG tablet   isosorbide mononitrate (IMDUR) 60 MG 24 hr tablet   ranolazine (RANEXA) 500 MG 12 hr tablet   Other Relevant Orders   Lipid panel   Comprehensive metabolic panel     Other   Myalgia due to statin (Chronic)    Extremely limiting symptoms while on statin.  Better off of statin.  Now on Praluent.      Relevant Orders   Lipid panel    Comprehensive metabolic panel   Fatigue due to treatment (Chronic)    Hard to tell what is causing fatigue.  I told her that he is switching from Toprol to nondihydropyridine calcium channel blocker which would potentially be an option but we would then have to switch amlodipine as well.  Since his BP levels are pretty stable I would prefer to avoid changing.      Relevant Orders   EKG 12-Lead (Completed)   Lipid panel   Comprehensive metabolic panel   ===================================  HPI:    Luis Guzman is a 76 y.o. male with a PMH notable for persistent atypical angina with CAD-PCI x2 separate lesions along with HTN, HLD and palpitations who presents today for 82-monthfollow-up.  Luis BOOHERwas last seen in person on Jan 04, 2022, following his cardiac catheterization-> hide still having intermittent episodes of chest discomfort-not usually exertional.  We had tried previously to go up to 10 mg of amlodipine weight did not tolerate that because of edema. => plan was to change ARB, reduced Imdur to 60 mg  daily w/ additional 30 mg (if he has to take a NTG SL).  I also added Ranexa 500 mg twice daily.  Converted losartan to olmesartan for better blood pressure control.  (This was subsequently converted to a different ARB due to insurance issues.  Currently on irbesartan. I saw him back by telehealth 1 month later he said he had 4 episodes where he had taken extra nitroglycerin in 1 month.  He also noticed lightheadedness and dizziness after switching his ARB.  We will try to target weight at home of somewhere between 185 to 187 pounds with is "happy zone being between 185 and 194 pounds.  Recent Hospitalizations: None  Reviewed  CV studies:    The following studies were reviewed today: (if available, images/films reviewed: From Epic Chart or Care Everywhere) No new studies:  Interval History:   Luis HOOPESreturns today stating that he feels okay.  Still has multiple complaints but  not as really having classic exertional anginal symptoms that he is having. Complaints include: Has a sense of chest fullness and tightness.  Presents here today for visit.  Not necessarily exertional in nature. Occasional heavy palpitations but nothing fast. Occasionally gets short of breath at night as well as occasionally at rest, but no real exertional component.  On September 26 through the 27th he was noting episodes where he was getting short of breath with the palpitations and sharp discomfort that took about an hour and a half to fully resolve.  CV Review of Symptoms (Summary): positive for - chest pain, dyspnea  on exertion, edema, irregular heartbeat, palpitations, paroxysmal nocturnal dyspnea, and shortness of breath negative for - orthopnea, rapid heart rate, or irregular rhythm, syncope/near syncope or TIA/amaurosis fugax, claudication  Apparently is currently on the process of getting evaluated by the [facility]   Review of Systems  Constitutional:  Negative for malaise/fatigue and weight loss.  HENT:  Positive for congestion.   Respiratory:  Positive for shortness of breath (As noted in HPI.). Negative for cough and sputum production.        Describes ache chest congestion/tightness that is persistent.  Cardiovascular:  Positive for chest pain (Sharp pains noted).  Musculoskeletal:  Positive for joint pain (Hips are definitely doing better).  Neurological:  Positive for dizziness. Negative for focal weakness and weakness.  Psychiatric/Behavioral:  Negative for depression and memory loss. The patient is nervous/anxious. The patient does not have insomnia.    I have reviewed and (if needed) personally updated the patient's problem list, medications, allergies, past medical and surgical history, social and family history.   PAST MEDICAL HISTORY   Past Medical History:  Diagnosis Date   AAA (abdominal aortic aneurysm) ([place])    Anemia    as infant history of    CAD S/P PTCA & DES PCI - for Progressive Angina 02/26/2019   Cath-PCI 02/26/2019: EF 55-65%. mCx 65% (FFR 0.82 - Med Rx). D2 85% - Wolverine Scoring Balloon PTCA (2.0 mm). Ost rPDA - 90% -Resolute Onyx 2.5 x 15 (2.6 mm). 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 lower back pain, s/p cervical neck surgery"stiffness" remains   Transfusion history    infant "anemia"    PAST SURGICAL HISTORY   Past Surgical History:  Procedure Laterality Date   APPENDECTOMY  ~ [place] N/A 07/30/2018   Procedure: Cervical five-six Cervical six-seven Artificial disc replacement;  Surgeon: Kristeen Miss, MD;  Location: [facility];  Service: Neurosurgery;  Laterality: N/A;   CERVICAL LAMINECTOMY  1997   C 4 and C 5   CHOLECYSTECTOMY N/A 04/15/2014   Procedure: LAPAROSCOPIC CHOLECYSTECTOMY WITH INTRAOPERATIVE CHOLANGIOGRAM;  Surgeon: Armandina Gemma, MD;  Location: [facility];  Service: General;  Laterality: N/A;   COLONOSCOPY     CORONARY BALLOON ANGIOPLASTY N/A 02/26/2019   Procedure: CORONARY BALLOON ANGIOPLASTY;  Surgeon: Leonie Man, MD;  Location: [facility];  Service: Cardiovascular -> scoring balloon PTCA (Wolverine 2.0 mm) of ostial D2 85% reducing to 20-30%.   CORONARY STENT INTERVENTION N/A 02/26/2019   Procedure: CORONARY STENT INTERVENTION;  Surgeon: Leonie Man, MD;  Location: [facility];; PCI ostial RPDA (initial attempt of PTCA only led to small local tear/dissection covered with stent) Resolute Onyx 2.5 mm x 15 mm (2.6 mm    CORONARY STENT INTERVENTION N/A 07/05/2019   Procedure: CORONARY STENT INTERVENTION;  Surgeon: Leonie Man, MD;  Location: [facility];  Service: Cardiovascular;; ostial D1 95% (restenosis of PTCA site) -> DES  PCI RESOLUTE ONYX 2.25 mm x 50 mm (2.6 mm) positioned to avoid the true ostium.    ESOPHAGOGASTRODUODENOSCOPY N/A 03/13/2014   Procedure: ESOPHAGOGASTRODUODENOSCOPY (EGD);  Surgeon: Wonda Horner, MD;  Location[facility];  Service: Endoscopy;  Laterality: N/A;   INTRAVASCULAR PRESSURE WIRE/FFR STUDY N/A 02/26/2019   Procedure: INTRAVASCULAR PRESSURE WIRE/FFR STUDY;  Surgeon: Leonie Man, MD;  Location: [facility];  Service: Cardiovascular;; mCx ~65% - DFR 0.92, FR 0.82 - BORDERLINE--> MED Rx   INTRAVASCULAR PRESSURE WIRE/FFR STUDY N/A 11/10/2021   Procedure: INTRAVASCULAR PRESSURE WIRE/FFR STUDY;  Surgeon: Leonie Man, MD;  Location: [facility];  Service: Cardiovascular;  Laterality: N/A;   LEFT HEART CATH AND CORONARY ANGIOGRAPHY N/A 02/26/2019   Procedure: LEFT HEART CATH AND CORONARY ANGIOGRAPHY;  Surgeon: Leonie Man, MD;  Location: [facility];  EF 55-65%. mCx 65% (FFR 0.82 - Med Rx). D2 85% - Wolverine Scoring Balloon PTCA (2.0 mm). Ost rPDA - 90% -Resolute Onyx 2.5 x 15 (2.6 mm).    LEFT HEART CATH AND CORONARY ANGIOGRAPHY N/A 07/05/2019   Procedure: LEFT HEART CATH AND CORONARY ANGIOGRAPHY;  Surgeon: Leonie Man, MD;  Location: [facility] INVASIVE CV LAB;; ostial D1 95% (restenosis of PTCA site) -> DES PCI.  PDA stent widely patent.  Mid CX stable 65% lesion.  Proximal RCA 40%.  Mid RCA 45%.  Normal EDP.   LEFT HEART CATH AND CORONARY ANGIOGRAPHY N/A 11/10/2021   Procedure: LEFT HEART CATH AND CORONARY ANGIOGRAPHY;  Surgeon: Leonie Man, MD;  Location: [place] [facility];  Service: CV:: : Stable mid LCx with 5% (RFR 0.95).  Stable proximal RCA 40% and mid RCA 55%.  D1 and RPDA stents widely patent (   NM MYOVIEW LTD  10/20/2021    Horizontal ST depressions in V4 and V5, frequent exercise-induced PVCs.  Inferior patient defect improves with stress-consistent with artifact.  Positive EKG, negative perfusion imaging.  Read as moderate risk study because of exertional ectopy.  Normal EF.   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   SHOULDER ARTHROSCOPY Bilateral    SHOULDER ARTHROSCOPY W/ ROTATOR CUFF REPAIR Bilateral 2004-2009   left 2004, right 2009   TOTAL HIP ARTHROPLASTY Left 03/04/2020   Procedure: TOTAL HIP ARTHROPLASTY ANTERIOR APPROACH;  Surgeon: Gaynelle Arabian, MD;  [facility];  Service: Orthopedics;  Laterality: Left;  152mn   TOTAL HIP ARTHROPLASTY Right 09/30/2020   Procedure: TOTAL HIP ARTHROPLASTY ANTERIOR APPROACH;  Surgeon: AGaynelle Arabian MD;  [facility];  Service: Orthopedics;  Laterality: Right;  10106m   TRANSTHORACIC ECHOCARDIOGRAM  02/2020; 10/01/2020   a) EF 55 to 60%.  GR 1 DD.  No R WMA.  Trivial MR.  Otherwise normal.; b) EF 55 to 60%.  Normal LV function and no wall motion abnormality.  Normal RV.  Normal valves..   TRANSTHORACIC ECHOCARDIOGRAM  10/18/2021   Normal LV function with a EF of 65%.  No RWMA.  GR 1 DD.  Normal valves.  Mild concentric LVH.  Mild MR and mild-moderate AI.   VASECTOMY     WISDOM TOOTH EXTRACTION     Left Heart Cath with Coronary Angiography 11/10/2021: Stable mid LCx with 5% (RFR 0.95).  Stable proximal RCA 40% and mid RCA 55%.  D1 and RPDA stents widely patent. => Lasix dose increased to 3 days a week, Ranexa 500 mg twice daily and Imdur increased to 60 mg. Post-cath complication with failed minx closure  device.  Patient developed a hematoma (no surgery) several hours after device placement.  Patient spent the next 2 days in the hospital.    Immunization History  Administered Date(s) Administered   Fluad Quad(high Dose 65+) 05/06/2019, 05/06/2020, 05/18/2021, 05/20/2022   Influenza Split 05/15/2012   Influenza Whole 06/15/2010   Influenza,  High Dose Seasonal PF 04/15/2016, 04/18/2017, 05/02/2018   Influenza,inj,Quad PF,6+ Mos 08/02/2013, 05/05/2014   Influenza,inj,quad, With Preservative 05/16/2019   Influenza-Unspecified 07/30/2015   PFIZER Comirnaty(Gray Top)Covid-19 Tri-Sucrose Vaccine 12/10/2020   PFIZER(Purple Top)SARS-COV-2 Vaccination 08/30/2019, 09/20/2019, 06/13/2020   Pfizer Covid-19 Vaccine Bivalent Booster 49yr & up 09/01/2021   Pneumococcal Conjugate-13 09/27/2013   Pneumococcal Polysaccharide-23 09/15/2008, 04/16/2015   Td 08/16/2003   Tdap 05/05/2014   Zoster Recombinat (Shingrix) 10/14/2018, 10/14/2018, 03/20/2019, 03/20/2019   Zoster, Live 06/17/2011    MEDICATIONS/ALLERGIES   Current Meds  Medication Sig   acetaminophen (TYLENOL) 500 MG tablet Take 1,000 mg by mouth every 6 (six) hours as needed for moderate pain.   Alirocumab (PRALUENT) 150 MG/ML SOAJ Inject 150 mg into the skin every 14 (fourteen) days.   aspirin EC 81 MG tablet Take 1 tablet (81 mg total) by mouth daily. Swallow whole. ( start after stopping clopidogrel 75 mg)   bimatoprost (LUMIGAN) 0.03 % ophthalmic solution Place 1 drop into both eyes at bedtime.   Brinzolamide-Brimonidine (SIMBRINZA) 1-0.2 % SUSP Place 1 drop into both eyes in the morning and at bedtime.   ibuprofen (ADVIL) 200 MG tablet Take 400 mg by mouth every 6 (six) hours as needed for moderate pain.   irbesartan (AVAPRO) 150 MG tablet Take 1 tablet (150 mg total) by mouth daily.   metoprolol succinate (TOPROL-XL) 25 MG 24 hr tablet TAKE 1/2 TABLET BY MOUTH EVERY DAY   nitroGLYCERIN (NITROSTAT) 0.4 MG SL tablet Place 1 tablet (0.4 mg total) under the tongue every 5 (five) minutes as needed for chest pain.   potassium chloride SA (KLOR-CON M) 20 MEQ tablet Take 10 mEq (0.5 tablets) on days that you take lasix.   traZODone (DESYREL) 50 MG tablet Take 50 mg by mouth at bedtime.   '[]'$  furosemide (LASIX) 20 MG tablet Take daily as needed for shortness of breath, wt gain,  swelling. (Patient taking differently: 20 mg daily.)   '[]'$  isosorbide mononitrate (IMDUR) 60 MG 24 hr tablet Take one 60 mg tablet with your 30 mg tablet daily (90 mg total)   '[]'$  ranolazine (RANEXA) 500 MG 12 hr tablet Take 1 tablet (500 mg total) by mouth 2 (two) times daily.    Allergies  Allergen Reactions   Statins     Muscle pain   Penicillins Itching    Has patient had a PCN reaction causing immediate rash, facial/tongue/throat swelling, SOB or lightheadedness with hypotension: no Has patient had a PCN reaction causing severe rash involving mucus membranes or skin necrosis: unkn Has patient had a PCN reaction that required hospitalization: no Has patient had a PCN reaction occurring within the last 10 years: no If all of the above answers are "NO", then may proceed with Cephalosporin use.  Tolerated Cephalosporin Date: 10/01/20.    SOCIAL HISTORY/FAMILY HISTORY   Reviewed in Epic:  Pertinent findings:  Social History   Tobacco Use   Smoking status: Former    Packs/day: 1.00    Years: 5.00    Total pack years: 5.00    Types: Cigarettes    Quit date: 10/17/1968    Years since quitting: 53.6   Smokeless tobacco:  Never  Vaping Use   Vaping Use: Never used  Substance Use Topics   Alcohol use: Not Currently    Comment: "quit drinking ~ 10/1968   Drug use: No   Social History   Social History Narrative   Not on file    OBJCTIVE -PE, EKG, labs   Wt Readings from Last 3 Encounters:  05/23/22 190 lb 12.8 oz (86.5 kg)  05/20/22 189 lb 8 oz (86 kg)  05/16/22 186 lb (84.4 kg)    Physical Exam: BP 120/68   Pulse 73   Ht '6\' 1"'$  (1.854 m)   Wt 190 lb 12.8 oz (86.5 kg)   SpO2 100%   BMI 25.17 kg/m  Physical Exam Vitals reviewed.  Constitutional:      General: He is not in acute distress.    Appearance: Normal appearance. He is normal weight. He is not ill-appearing or toxic-appearing.  HENT:     Head: Normocephalic and atraumatic.  Neck:     Vascular: No carotid  bruit.  Cardiovascular:     Rate and Rhythm: Normal rate and regular rhythm.     Pulses: Normal pulses.     Heart sounds: No murmur heard.    No friction rub. No gallop.  Pulmonary:     Effort: Pulmonary effort is normal. No respiratory distress.     Breath sounds: Normal breath sounds. No wheezing, rhonchi or rales.  Chest:     Chest wall: Tenderness present.  Musculoskeletal:        General: No swelling. Normal range of motion.     Cervical back: Normal range of motion and neck supple.  Skin:    General: Skin is warm and dry.  Neurological:     General: No focal deficit present.     Mental Status: He is alert and oriented to person, place, and time.     Motor: No weakness.     Gait: Gait abnormal.  Psychiatric:        Mood and Affect: Mood normal.        Behavior: Behavior normal.        Thought Content: Thought content normal.        Judgment: Judgment normal.     Adult ECG Report  Rate: 73 ;  Rhythm: normal sinus rhythm, premature ventricular contractions (PVC), and specific ST-T wave changes. ;   Narrative Interpretation: Stable  Recent Labs: Labs just checked.  Creatinine reviewed. Lab Results  Component Value Date   CHOL 144 05/20/2022   HDL 41.90 05/20/2022   LDLCALC 73 05/20/2022   LDLDIRECT 129.0 08/19/2016   TRIG 148.0 05/20/2022   CHOLHDL 3 05/20/2022   Lab Results  Component Value Date   CREATININE 1.19 05/20/2022   BUN 15 05/20/2022   NA 140 05/20/2022   K 3.7 05/20/2022   CL 103 05/20/2022   CO2 31 05/20/2022      Latest Ref Rng & Units 05/20/2022    9:13 AM 11/24/2021   10:01 AM 11/12/2021    7:21 AM  CBC  WBC 4.0 - 10.5 K/uL 8.2  9.7  9.3   Hemoglobin 13.0 - 17.0 g/dL 15.5  16.1  13.7   Hematocrit 39.0 - 52.0 % 44.9  46.6  38.8   Platelets 150.0 - 400.0 K/uL 211.0  269  174     Lab Results  Component Value Date   HGBA1C 5.9 05/20/2022   Lab Results  Component Value Date   TSH 0.61 05/06/2019     ==================================================  I spent a total of 34 minutes with the patient spent in direct patient consultation.  Additional time spent with chart review  / charting (studies, outside notes, etc): 18 min Total Time: 52 min  Current medicines are reviewed at length with the patient today.  (+/- concerns) none  Notice: This dictation was prepared with Dragon dictation along with smart phrase technology. Any transcriptional errors that result from this process are unintentional and may not be corrected upon review.  Studies Ordered:   Orders Placed This Encounter  Procedures   Lipid panel   Comprehensive metabolic panel   EKG [id number]   VAS [place] AAA DUPLEX   Meds ordered this encounter  Medications   furosemide (LASIX) 20 MG tablet    Sig: Take  20 to 40 mg daily as needed for shortness of breath, wt gain, swelling.    Dispense:  180 tablet    Refill:  3    Take daily as needed for shortness of breath, wt gain, swelling.   isosorbide mononitrate (IMDUR) 60 MG 24 hr tablet    Sig: Take 1 tablet (60 mg total) by mouth daily.    Dispense:  90 tablet    Refill:  3   ranolazine (RANEXA) 500 MG 12 hr tablet    Sig: Take 1000 mg in the morning and 500 mg in the evening    Dispense:  270 tablet    Refill:  3    Patient Instructions / Medication Changes & Studies & Tests Ordered   Patient Instructions  Medication Instructions:    Changes  Ranexa 1000 mg ( 2 tab) in the morning   500 mg  daily   Decrease Imdur to 60 mg daily    Lasix   *If you need a refill on your cardiac medications before your next appointment, please call your pharmacy*   Lab [facility] April 2024 CMP Lipid  If you have labs (blood work) drawn today and your tests are completely normal, you will receive your results only by: MyChart Message (if you have MyChart) OR A paper copy in the mail If you have any lab test that is abnormal or we need to change your treatment, we will  call you to review the results.   Testing/Procedures:  WILL BE SCHEDULE IN APRIL 2024 at [facility] has requested that you have an abdominal aorta duplex. During this test, an ultrasound is used to evaluate the aorta. Allow 30 minutes for this exam. Do not eat after midnight the day before and avoid carbonated beverages   Follow-Up: At [facility], you and your health needs are our priority.  As part of our continuing mission to provide you with exceptional heart care, we have created designated Provider Care Teams.  These Care Teams include your primary Cardiologist (physician) and Advanced Practice Providers (APPs -  Physician Assistants and Nurse Practitioners) who all work together to provide you with the care you need, when you need it.     Your next appointment:    6 TO 7 month(s)  The format for your next appointment:   In Person  Provider:   Glenetta Hew, MD    Other Instructions      Leonie Man, MD, MS Glenetta Hew, M.D., M.S. Interventional Cardiologist  [facility]  Pager # [phone] Phone # [phone] [address] [place], [place] [address]   Thank you for choosing [facility]!!

## 2022-06-03 ENCOUNTER — Telehealth: Payer: Self-pay | Admitting: Pharmacist

## 2022-06-03 NOTE — Progress Notes (Unsigned)
Chronic Care Management Pharmacy Note  06/06/2022 Name:  Luis Guzman MRN:  [id number] DOB:  1946/07/17  Summary: BP is at goal < 130/80 per home readings   Recommendations/Changes made from today's visit: -Recommended bringing BP cuff to next office visit to ensure accuracy -Recommend repeat urine microalbumin   Plan: BP assessment in 6 months Follow up in 1 year  Subjective: Luis Guzman is an 76 y.o. year old male who is a primary patient of Burchette, Alinda Sierras, MD.  The CCM team was consulted for assistance with disease management and care coordination needs.    Engaged with patient by telephone for follow up visit in response to provider referral for pharmacy case management and/or care coordination services.   Consent to Services:  The patient was given information about Chronic Care Management services, agreed to services, and gave verbal consent prior to initiation of services.  Please see initial visit note for detailed documentation.   Patient Care Team: Eulas Post, MD as PCP - General Leonie Man, MD as PCP - Cardiology (Cardiology) Kristeen Miss, MD as Consulting Physician (Neurosurgery) Garald Balding, MD as Consulting Physician (Orthopedic Surgery) Viona Gilmore, [facility] as Pharmacist (Pharmacist)  Recent office visits: 05/20/22 Carolann Littler, MD: Patient presented for annual exam. No medication changes.  05/16/22 Glenna Durand, LPN: Patient presented for AWV.  03/21/22 Carolann Littler, MD: Patient presented for video visit due to COVID 19 infection. Prescribed Hydrocan and molnupiravir x 5 days.  Recent consult visits: 05/23/22 Glenetta Hew, MD (cardiology): Patient presented for chest pain and CAD follow up.  Changed Ranexa to 1000 mg in the morning and decreased Imdur to 60 mg daily. Follow up in 6 months.  02/04/22 Glenetta Hew, MD (cardiology): Patient presented for chest pain and CAD follow up. Switched losartan to irbesartan.  Follow up in  4 months.  01/04/2022 Glenetta Hew MD (cardiology) - Patient was seen for Coronary artery disease involving native coronary artery of native heart with angina pectoris and additional issues. Started Olmesartan 20 mg daily. Discontinued Losartan. Follow up in 1 month.   12/08/2021 Diona Browner NP [facility]) - Patient was seen for Coronary artery disease involving native coronary artery of native heart without angina pectoris and additional issues. No medication changes. Follow up May 2023 appointment.   Hospital visits: None in previous 6 months.  Objective:  Lab Results  Component Value Date   CREATININE 1.19 05/20/2022   BUN 15 05/20/2022   GFR 59.48 (L) 05/20/2022   GFRNONAA >60 11/12/2021   GFRAA 77 09/02/2020   NA 140 05/20/2022   K 3.7 05/20/2022   CALCIUM 9.3 05/20/2022   CO2 31 05/20/2022   GLUCOSE 125 (H) 05/20/2022    Lab Results  Component Value Date/Time   HGBA1C 5.9 05/20/2022 09:13 AM   HGBA1C 6.0 05/18/2021 08:40 AM   GFR 59.48 (L) 05/20/2022 09:13 AM   GFR 78.50 05/18/2021 08:40 AM   MICROALBUR 1.2 08/19/2016 08:38 AM    Last diabetic Eye exam: No results found for: "HMDIABEYEEXA"  Last diabetic Foot exam: No results found for: "HMDIABFOOTEX"   Lab Results  Component Value Date   CHOL 144 05/20/2022   HDL 41.90 05/20/2022   LDLCALC 73 05/20/2022   LDLDIRECT 129.0 08/19/2016   TRIG 148.0 05/20/2022   CHOLHDL 3 05/20/2022       Latest Ref Rng & Units 05/20/2022    9:13 AM 07/02/2021    9:17 AM 05/18/2021    8:40 AM  Hepatic Function  Total Protein 6.0 - 8.3 g/dL 7.0  6.4  6.5   Albumin 3.5 - 5.2 g/dL 4.4  4.4  4.2   AST 0 - 37 U/L 21  22  20    ALT 0 - 53 U/L 17  16  17    Alk Phosphatase 39 - 117 U/L 105  122  117   Total Bilirubin 0.2 - 1.2 mg/dL 0.8  0.4  0.4     Lab Results  Component Value Date/Time   TSH 0.61 05/06/2019 09:07 AM   TSH 0.79 08/19/2016 08:38 AM       Latest Ref Rng & Units 05/20/2022    9:13 AM 11/24/2021   10:01 AM  11/12/2021    7:21 AM  CBC  WBC 4.0 - 10.5 K/uL 8.2  9.7  9.3   Hemoglobin 13.0 - 17.0 g/dL 15.5  16.1  13.7   Hematocrit 39.0 - 52.0 % 44.9  46.6  38.8   Platelets 150.0 - 400.0 K/uL 211.0  269  174     No results found for: "VD25OH"  Clinical ASCVD: Yes  The 10-year ASCVD risk score (Arnett DK, et al., 2019) is: 44.4%   Values used to calculate the score:     Age: 55 years     Sex: Male     Is Non-Hispanic African American: No     Diabetic: Yes     Tobacco smoker: No     Systolic Blood Pressure: 009 mmHg     Is BP treated: Yes     HDL Cholesterol: 41.9 mg/dL     Total Cholesterol: 144 mg/dL       05/20/2022    8:58 AM 05/16/2022    8:06 AM 05/07/2021    8:25 AM  Depression screen PHQ 2/9  Decreased Interest 0 0 0  Down, Depressed, Hopeless 0 0 0  PHQ - 2 Score 0 0 0      Social History   Tobacco Use  Smoking Status Former   Packs/day: 1.00   Years: 5.00   Total pack years: 5.00   Types: Cigarettes   Quit date: 10/17/1968   Years since quitting: 53.6  Smokeless Tobacco Never   BP Readings from Last 3 Encounters:  05/23/22 120/68  05/20/22 114/60  03/21/22 (!) 141/75   Pulse Readings from Last 3 Encounters:  05/23/22 73  05/20/22 67  02/04/22 (!) 53   Wt Readings from Last 3 Encounters:  05/23/22 190 lb 12.8 oz (86.5 kg)  05/20/22 189 lb 8 oz (86 kg)  05/16/22 186 lb (84.4 kg)   BMI Readings from Last 3 Encounters:  05/23/22 25.17 kg/m  05/20/22 25.67 kg/m  05/16/22 24.54 kg/m    Assessment/Interventions: Review of patient past medical history, allergies, medications, health status, including review of consultants reports, laboratory and other test data, was performed as part of comprehensive evaluation and provision of chronic care management services.   SDOH:  (Social Determinants of Health) assessments and interventions performed: Yes  SDOH Interventions    Flowsheet Row Chronic Care Management from 06/06/2022 in [place] at  [place] from 05/16/2022 in [place] at [place] from 05/07/2021 in [place] at [place] from 05/15/2020 in [place] at [place] Interventions -- Intervention Not Indicated Intervention Not Indicated Intervention Not Indicated  Housing Interventions -- -- Intervention Not Indicated Intervention Not Indicated  Transportation Interventions -- Intervention Not Indicated  Intervention Not Indicated Intervention Not Indicated  Depression Interventions/Treatment  -- -- -- VQM0-8 Score <4 Follow-up Not Indicated  Financial Strain Interventions Intervention Not Indicated Intervention Not Indicated Intervention Not Indicated Intervention Not Indicated  Physical Activity Interventions -- Patient Refused, Other (Comments) Intervention Not Indicated Intervention Not Indicated  Stress Interventions -- Intervention Not Indicated Intervention Not Indicated Intervention Not Indicated  Social Connections Interventions -- -- Intervention Not Indicated Intervention Not Indicated      SDOH Screenings   Food Insecurity: No Food Insecurity (05/16/2022)  Housing: Low Risk  (05/07/2021)  Transportation Needs: No Transportation Needs (05/16/2022)  Alcohol Screen: Low Risk  (05/07/2021)  Depression (PHQ2-9): Low Risk  (05/20/2022)  Financial Resource Strain: Low Risk  (06/06/2022)  Physical Activity: Inactive (05/16/2022)  Social Connections: Moderately Isolated (05/07/2021)  Stress: No Stress Concern Present (05/16/2022)  Tobacco Use: Medium Risk (06/05/2022)    CCM Care Plan  Allergies  Allergen Reactions   Statins     Muscle pain   Penicillins Itching    Has patient had a PCN reaction causing immediate rash, facial/tongue/throat swelling, SOB or lightheadedness with hypotension: no Has patient had a PCN reaction causing severe rash involving mucus membranes or skin necrosis: unkn Has  patient had a PCN reaction that required hospitalization: no Has patient had a PCN reaction occurring within the last 10 years: no If all of the above answers are "NO", then may proceed with Cephalosporin use.  Tolerated Cephalosporin Date: 10/01/20.    Medications Reviewed Today     Reviewed by Viona Gilmore, [facility] (Pharmacist) on 06/06/22 at [phone]  Med List Status: <None>   Medication Order Taking? Sig Documenting Provider Last Dose Status Informant  acetaminophen (TYLENOL) 500 MG tablet [id number]  Take 1,000 mg by mouth every 6 (six) hours as needed for moderate pain. [provider]  Active Self  Alirocumab (PRALUENT) 150 MG/ML SOAJ [id number] Yes Inject 150 mg into the skin every 14 (fourteen) days. [provider] Taking Active Self  amLODipine (NORVASC) 5 MG tablet [id number]  Take 1 tablet (5 mg total) by mouth daily.  Patient taking differently: Take 5 mg by mouth every evening.   Lelon Perla, MD  Expired 02/04/22 2359 Self  aspirin EC 81 MG tablet [id number]  Take 1 tablet (81 mg total) by mouth daily. Swallow whole. ( start after stopping clopidogrel 75 mg) Leonie Man, MD  Active Self  bimatoprost (LUMIGAN) 0.03 % ophthalmic solution [id number]  Place 1 drop into both eyes at bedtime. [provider]  Active Self  Brinzolamide-Brimonidine (SIMBRINZA) 1-0.2 % SUSP [id number]  Place 1 drop into both eyes in the morning and at bedtime. [provider]  Active Self  furosemide (LASIX) 20 MG tablet [id number]  Take  20 to 40 mg daily as needed for shortness of breath, wt gain, swelling. Leonie Man, MD  Active   ibuprofen (ADVIL) 200 MG tablet [id number]  Take 400 mg by mouth every 6 (six) hours as needed for moderate pain. [provider]  Active Self  irbesartan (AVAPRO) 150 MG tablet [id number]  Take 1 tablet (150 mg total) by mouth daily. Leonie Man, MD  Active   isosorbide mononitrate (IMDUR) 60 MG 24 hr tablet [id number]  Yes Take 1 tablet (60 mg total) by mouth daily. Leonie Man, MD Taking Active   metoprolol succinate (TOPROL-XL) 25 MG 24 hr tablet [id number]  TAKE 1/2 TABLET BY MOUTH EVERY DAY Leonie Man, MD  Active  nitroGLYCERIN (NITROSTAT) 0.4 MG SL tablet [id number]  Place 1 tablet (0.4 mg total) under the tongue every 5 (five) minutes as needed for chest pain. Leonie Man, MD  Active Self  potassium chloride SA (KLOR-CON M) 20 MEQ tablet [id number]  Take 10 mEq (0.5 tablets) on days that you take lasix. Ledora Bottcher, PA  Active   ranolazine (RANEXA) 500 MG 12 hr tablet [id number] Yes Take 1000 mg in the morning and 500 mg in the evening Leonie Man, MD Taking Active   traZODone (DESYREL) 50 MG tablet [id number] Yes Take 50 mg by mouth at bedtime. [provider] Taking Active Self            Patient Active Problem List   Diagnosis Date Noted   Myalgia due to statin 12/10/2020   Primary osteoarthritis of right hip 09/30/2020   OA (osteoarthritis) of hip 03/04/2020   Pain in left knee 10/30/2019   Fatigue due to treatment 07/18/2019   Coronary artery disease involving native coronary artery of native heart with angina pectoris ([place]) 06/28/2019   Preop cardiovascular exam 05/31/2019   Hypokalemia 02/27/2019   CAD S/P & DES PCI -ostial PDA and D1 02/26/2019   Atypical angina 02/14/2019   Cervical spondylosis with myelopathy 07/30/2018   AAA (abdominal aortic aneurysm) without rupture ([place]) 04/15/2016   Biliary dyskinesia 04/08/2014   Abdominal pain, epigastric 04/08/2014   Pancreatitis, acute 03/12/2014   Hyperglycemia 06/25/2012   ACUTE BRONCHITIS 03/11/2010   OSTEOARTHRITIS, GENERALIZED, MULTIPLE JOINTS 01/06/2010   SINUSITIS, ACUTE 06/11/2009   Hyperlipidemia with target LDL less than 70 - statin intolerant 01/26/2009   Essential hypertension 01/26/2009    Immunization History  Administered Date(s) Administered   Fluad Quad(high Dose 65+) 05/06/2019,  05/06/2020, 05/18/2021, 05/20/2022   Influenza Split 05/15/2012   Influenza Whole 06/15/2010   Influenza, High Dose Seasonal PF 04/15/2016, 04/18/2017, 05/02/2018   Influenza,inj,Quad PF,6+ Mos 08/02/2013, 05/05/2014   Influenza,inj,quad, With Preservative 05/16/2019   Influenza-Unspecified 07/30/2015   PFIZER Comirnaty(Gray Top)Covid-19 Tri-Sucrose Vaccine 12/10/2020   PFIZER(Purple Top)SARS-COV-2 Vaccination 08/30/2019, 09/20/2019, 06/13/2020   Pfizer Covid-19 Vaccine Bivalent Booster 54yr & up 09/01/2021   Pneumococcal Conjugate-13 09/27/2013   Pneumococcal Polysaccharide-23 09/15/2008, 04/16/2015   Td 08/16/2003   Tdap 05/05/2014   Zoster Recombinat (Shingrix) 10/14/2018, 10/14/2018, 03/20/2019, 03/20/2019   Zoster, Live 06/17/2011   Patient reports he hasn't noticed a change in "chest pressure" since the medications were adjusted. He reports he doesn't get chest pain but has been having take nitro because of chest pressure. He took some with doing some yard work last week and was told it was ok to take a few every month by cardiology.  Patient has been having gut issues over this past weekend and is taking a medication for nausea and pain. He had this issue earlier this year and treated it the same way with success in the past. He had followed with GI who gave him this regimen.  Patient has been sleeping normally lately. He denies any recent changes with his sleep. He reports he sometimes goes back to sleep easily and other times not. They do have a dog who needs to go out to the bathroom during the night which wakes him up. He does feel pretty tired during the day but does not nap.  Conditions to be addressed/monitored:  Hypertension, Hyperlipidemia, Coronary Artery Disease, and Insomnia, Glaucoma and Pre-diabetes  Conditions addressed this visit: Hypertension, CAD, insomnia  Care [place] : [place]  Updates made by Viona Gilmore, [place] since 06/06/2022 12:00 AM      Problem: Problem: Hypertension, Hyperlipidemia, Coronary Artery Disease and Insomnia, Glaucoma      Long-Range Goal: Patient-Specific Goal   Start Date: 12/07/2020  Expected End Date: 12/07/2021  Recent Progress: On track  Priority: High  Note:   Current Barriers:  Unable to independently monitor therapeutic efficacy Unable to achieve control of cholesterol   Pharmacist Clinical Goal(s):  Patient will achieve adherence to monitoring guidelines and medication adherence to achieve therapeutic efficacy achieve control of cholesterol as evidenced by next lipid panel  through collaboration with PharmD and provider.   Interventions: 1:1 collaboration with Eulas Post, MD regarding development and update of comprehensive plan of care as evidenced by provider attestation and co-signature Inter-disciplinary care team collaboration (see longitudinal plan of care) Comprehensive medication review performed; medication list updated in electronic medical record  Hypertension (BP goal <130/80) -Uncontrolled (based on office readings) -Current treatment: Irbesartan 150 mg 1 tablet daily - Appropriate, Effective, Safe, Accessible Amlodipine 5 mg 1 tablet daily - Appropriate, Effective, Safe, Accessible Metoprolol succinate 25 mg 1/2 tablet daily - Appropriate, Effective, Safe, Accessible -Medications previously tried: HCTZ (kidney function), valsartan (formulary change), losartan (elevated BP) -Current home readings: 120/62, 125/65, 112/69 (checking every day) - arm cuff; has not brought check it with office cuff -Current dietary habits: patient doesn't use much salt and does not eat frozen or canned foods  -Current exercise habits: using an elliptical for 30 minutes a day but doing some physical therapy -Denies hypotensive/hypertensive symptoms -Educated on Exercise goal of 150 minutes per week; Importance of home blood pressure monitoring; Proper BP monitoring technique; -Counseled to  monitor BP at home weekly, document, and provide log at future appointments -Counseled on diet and exercise extensively Recommended to continue current medication  Hyperlipidemia: (LDL goal < 70) -Not ideally controlled -Current treatment: Praluent 150 mg/mL inject every 14 days - Appropriate, Query effective, Safe, Accessible -Medications previously tried: Repatha (formulary change), statins (muscle pain), Zetia -Current dietary patterns: doesn't eat a lot of sweets -Current exercise habits: hasn't been doing much for the last 2 months except PT exercises -Educated on Cholesterol goals;  Importance of limiting foods high in cholesterol; Exercise goal of 150 minutes per week; -Counseled on diet and exercise extensively Recommended to continue current medication  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) -Controlled -Current treatment  Isosorbide mononitrate 60 mg 1 tablet daily - Appropriate, Effective, Safe, Accessible Aspirin 81 mg 1 tablet daily - Appropriate, Effective, Safe, Accessible Nitroglycerin 0.4 mg SL 1 tablet as needed - Appropriate, Effective, Safe, Accessible Ranolazine  500 mg 2 tablets in the morning and 1 tablet in the evening - Appropriate, Effective, Safe, Accessible Metoprolol succinate 25 mg 1/2 tablet daily - Appropriate, Effective, Safe, Accessible -Medications previously tried: clopidogrel  -Recommended to continue current medication  Insomnia (Goal: improve quality and quantity of sleep) -Not ideally controlled -Current treatment  Trazodone 50 mg 1 tablet at bedtime every night - Appropriate, Effective, Safe, Accessible -Medications previously tried: melatonin (ineffective)  -Counseled on practicing good sleep hygiene by setting a sleep schedule and maintaining it, avoid excessive napping, following a nightly routine, avoiding screen time for 30-60 minutes before going to bed, and making the bedroom a cool, quiet and dark space  Glaucoma (Goal:  lower intraocular pressure) -Controlled -Current treatment  Simbrinza 1-0.2% 1 drop in both eyes twice daily - Appropriate, Effective, Safe, Accessible -Medications previously tried: Lumigan -Recommended to continue  current medication  Pre-diabetes (A1c goal <6.5%) -Controlled -Current medications: No medications -Medications previously tried: none  -Current home glucose readings fasting glucose: does not need to check post prandial glucose: does not need to check -Denies hypoglycemic/hyperglycemic symptoms -Current meal patterns: lots of meat; sometimes 1-2 meals a day breakfast: n/a lunch: n/a dinner: sometimes the biggest snacks: n/a drinks: n/a -Current exercise: doing physical therapy -Educated on Carbohydrate counting and/or plate method -Counseled to check feet daily and get yearly eye exams -Counseled on diet and exercise extensively  Swelling (Goal: minimize fluid retention) -Controlled -Current treatment  Furosemide 20 mg 1 tablet as needed - Appropriate, Effective, Safe, Accessible Potassium chloride 10 mEq take on days you are taking Lasix - Appropriate, Effective, Safe, Accessible -Medications previously tried: none  -Recommended to continue current medication   Health Maintenance -Vaccine gaps:COVID booster -Current therapy:  Tylenol 500 mg - taking 2 daily - depends on the need -Educated on Cost vs benefit of each product must be carefully weighed by individual consumer -Patient is satisfied with current therapy and denies issues -Recommended to continue current medication  Patient Goals/Self-Care Activities Patient will:  - check blood pressure weekly, document, and provide at future appointments target a minimum of 150 minutes of moderate intensity exercise weekly  Follow Up Plan: Telephone follow up appointment with care management team member scheduled for: 1 year        Medication Assistance: None required.  Patient affirms current coverage  meets needs.  Compliance/Adherence/Medication fill history: Care Gaps: Foot exam, urine microalbumin, COVID booster Last BP- 120/68 (05/23/22) Last A1c - 5.9% (05/20/22)   Star-Rating Drugs: [facility]  Patient's preferred [facility] is:  [id number] [place], [place] - [address] [place]Phone: [phone]: [phone] [facility] [address]Phone: [phone]: [address] [address] [address] Manor[place]Phone: [phone]: [phone]  Uses pill box? No - spice rack in his cabinet Pt endorses 100% compliance  We discussed: Current pharmacy is preferred with insurance plan and patient is satisfied with pharmacy services Patient decided to: Continue current medication management strategy  Care Plan and Follow Up Patient Decision:  Patient agrees to Care Plan and Follow-up.  Plan: Telephone follow up appointment with care management team member scheduled for:  1 year  MJeni Salles PharmD BSanta ClaraPharmacist LOccidental Petroleumat [phone]

## 2022-06-03 NOTE — Chronic Care Management (AMB) (Signed)
    Chronic Care Management Pharmacy Assistant   Name: Luis Guzman  MRN: [id number] DOB: 1945-10-10  06/03/22 APPOINTMENT REMINDER    Patient has checked in and is aware to have all medications, supplements and any blood glucose and blood pressure readings available for review with Jeni Salles, Pharm. D, for telephone visit on 06/06/22 at 8:30.    Care Gaps: Diabetic Kidney Urine -Overdue Foot Exam - Overdue COVID Booster - Overdue BP- 120/68 05/23/22 AWV- 10/23  Star Rating Drug: Irbesartan 150 mg - No fill hx      Medications: Outpatient Encounter Medications as of 06/03/2022  Medication Sig   acetaminophen (TYLENOL) 500 MG tablet Take 1,000 mg by mouth every 6 (six) hours as needed for moderate pain.   Alirocumab (PRALUENT) 150 MG/ML SOAJ Inject 150 mg into the skin every 14 (fourteen) days.   amLODipine (NORVASC) 5 MG tablet Take 1 tablet (5 mg total) by mouth daily. (Patient taking differently: Take 5 mg by mouth every evening.)   aspirin EC 81 MG tablet Take 1 tablet (81 mg total) by mouth daily. Swallow whole. ( start after stopping clopidogrel 75 mg)   bimatoprost (LUMIGAN) 0.03 % ophthalmic solution Place 1 drop into both eyes at bedtime.   Brinzolamide-Brimonidine (SIMBRINZA) 1-0.2 % SUSP Place 1 drop into both eyes in the morning and at bedtime.   furosemide (LASIX) 20 MG tablet Take  20 to 40 mg daily as needed for shortness of breath, wt gain, swelling.   ibuprofen (ADVIL) 200 MG tablet Take 400 mg by mouth every 6 (six) hours as needed for moderate pain.   irbesartan (AVAPRO) 150 MG tablet Take 1 tablet (150 mg total) by mouth daily.   isosorbide mononitrate (IMDUR) 60 MG 24 hr tablet Take 1 tablet (60 mg total) by mouth daily.   metoprolol succinate (TOPROL-XL) 25 MG 24 hr tablet TAKE 1/2 TABLET BY MOUTH EVERY DAY   nitroGLYCERIN (NITROSTAT) 0.4 MG SL tablet Place 1 tablet (0.4 mg total) under the tongue every 5 (five) minutes as needed for chest pain.    potassium chloride SA (KLOR-CON M) 20 MEQ tablet Take 10 mEq (0.5 tablets) on days that you take lasix.   ranolazine (RANEXA) 500 MG 12 hr tablet Take 1000 mg in the morning and 500 mg in the evening   traZODone (DESYREL) 50 MG tablet Take 50 mg by mouth at bedtime.   No facility-administered encounter medications on file as of 06/03/2022.      [place] Clinical Pharmacist Assistant [phone]

## 2022-06-05 ENCOUNTER — Encounter: Payer: Self-pay | Admitting: Cardiology

## 2022-06-05 NOTE — Assessment & Plan Note (Signed)
Hard to tell what is causing fatigue.  I told her that he is switching from Toprol to nondihydropyridine calcium channel blocker which would potentially be an option but we would then have to switch amlodipine as well.  Since his BP levels are pretty stable I would prefer to avoid changing.

## 2022-06-05 NOTE — Assessment & Plan Note (Signed)
patient gaining at this point, he is not really having what sounds like true angina symptoms he has these were chest discomfort symptoms with or without exertion and we just completed a full evaluation with a Myoview that led to heart catheterization that did not show anything significant to work on.  He is very reluctant to go back to the Cath Lab.  (Has not had good luck with access site issues both radial and femoral)  Not sure what to make of his symptoms, but if there very well could be some microvascular component.  His stents are widely patent on cath and he does not have much other macrovascular disease.  Plan: Reduce Imdur to 60 mg daily and increase Ranexa to 1000 mg in the morning and 500 at night.--If he does take nitroglycerin, then for that they would take him back to 30 mg Imdur. Continue low-dose Toprol 12.5 mg, and amlodipine 5 mg

## 2022-06-05 NOTE — Assessment & Plan Note (Signed)
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  Most recent labs did not look as good as we would like him to be with LDL of 73, but in light of all of the symptoms, will hold off on further adjustments.

## 2022-06-05 NOTE — Assessment & Plan Note (Signed)
Extremely limiting symptoms while on statin.  Better off of statin.  Now on Praluent.

## 2022-06-05 NOTE — Assessment & Plan Note (Signed)
Per my last note, plan was to reassess AAA with a AAA Doppler.

## 2022-06-05 NOTE — Assessment & Plan Note (Signed)
Blood pressure pretty well controlled on current meds. Amlodipine 10 mg daily, irbesartan 150 mg daily, Toprol 12.5 mg daily and Imdur that we are reducing to 60 mg daily.

## 2022-06-05 NOTE — Assessment & Plan Note (Signed)
Not really sure what to make of all these episodes he is having, but the fact is having some symptoms at rest and some exertion would argue against it being a fixed lesion.  This was confirmed by cardiac cath showing stable widely patent coronaries.  Plan:   Continue amlodipine and Toprol at current dose.  To avoid tachyphylaxis, we will reduce Imdur to 60 mg daily (using the 30 mg tablet as needed if he has had to use sublingual NTG)  Also on irbesartan for blood pressure control; Praluent for lipids.  On aspirin; will had to stop Plavix because of bruising.

## 2022-06-06 ENCOUNTER — Encounter: Payer: Self-pay | Admitting: Cardiology

## 2022-06-06 ENCOUNTER — Ambulatory Visit: Payer: Medicare Other | Admitting: Pharmacist

## 2022-06-06 DIAGNOSIS — I25119 Atherosclerotic heart disease of native coronary artery with unspecified angina pectoris: Secondary | ICD-10-CM

## 2022-06-06 DIAGNOSIS — I10 Essential (primary) hypertension: Secondary | ICD-10-CM

## 2022-06-06 MED ORDER — NITROGLYCERIN 0.4 MG SL SUBL: 0.4000 mg | SUBLINGUAL_TABLET | SUBLINGUAL | 3 refills | Status: DC | PRN

## 2022-06-06 MED ORDER — PRALUENT 150 MG/ML ~~LOC~~ SOAJ: 150.0000 mg | SUBCUTANEOUS | 11 refills | Status: DC

## 2022-06-06 NOTE — Patient Instructions (Signed)
Hi Luis Guzman,  It was great to catch up with you again! Let us know when you get your next COVID booster so we can update your chart.  Also, don't forget to bring your blood pressure cuff to your next office visit to make sure it's accurate.  Please reach out to me if you have any questions or need anything before our follow up!  Best, Maddie  Jeni Salles, PharmD, [facility]   Visit Information   Goals Addressed   None    Patient Care Plan: CCM Pharmacy Care Plan     Problem Identified: Problem: Hypertension, Hyperlipidemia, Coronary Artery Disease and Insomnia, Glaucoma      Long-Range Goal: Patient-Specific Goal   Start Date: 12/07/2020  Expected End Date: 12/07/2021  Recent Progress: On track  Priority: High  Note:   Current Barriers:  Unable to independently monitor therapeutic efficacy Unable to achieve control of cholesterol   Pharmacist Clinical Goal(s):  Patient will achieve adherence to monitoring guidelines and medication adherence to achieve therapeutic efficacy achieve control of cholesterol as evidenced by next lipid panel  through collaboration with PharmD and provider.   Interventions: 1:1 collaboration with Eulas Post, MD regarding development and update of comprehensive plan of care as evidenced by provider attestation and co-signature Inter-disciplinary care team collaboration (see longitudinal plan of care) Comprehensive medication review performed; medication list updated in electronic medical record  Hypertension (BP goal <130/80) -Uncontrolled (based on office readings) -Current treatment: Irbesartan 150 mg 1 tablet daily - Appropriate, Effective, Safe, Accessible Amlodipine 5 mg 1 tablet daily - Appropriate, Effective, Safe, Accessible Metoprolol succinate 25 mg 1/2 tablet daily - Appropriate, Effective, Safe, Accessible -Medications previously tried: HCTZ (kidney function),  valsartan (formulary change), losartan (elevated BP) -Current home readings: 120/62, 125/65, 112/69 (checking every day) - arm cuff; has not brought check it with office cuff -Current dietary habits: patient doesn't use much salt and does not eat frozen or canned foods  -Current exercise habits: using an elliptical for 30 minutes a day but doing some physical therapy -Denies hypotensive/hypertensive symptoms -Educated on Exercise goal of 150 minutes per week; Importance of home blood pressure monitoring; Proper BP monitoring technique; -Counseled to monitor BP at home weekly, document, and provide log at future appointments -Counseled on diet and exercise extensively Recommended to continue current medication  Hyperlipidemia: (LDL goal < 70) -Not ideally controlled -Current treatment: Praluent 150 mg/mL inject every 14 days - Appropriate, Query effective, Safe, Accessible -Medications previously tried: Repatha (formulary change), statins (muscle pain), Zetia -Current dietary patterns: doesn't eat a lot of sweets -Current exercise habits: hasn't been doing much for the last 2 months except PT exercises -Educated on Cholesterol goals;  Importance of limiting foods high in cholesterol; Exercise goal of 150 minutes per week; -Counseled on diet and exercise extensively Recommended to continue current medication  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) -Controlled -Current treatment  Isosorbide mononitrate 60 mg 1 tablet daily - Appropriate, Effective, Safe, Accessible Aspirin 81 mg 1 tablet daily - Appropriate, Effective, Safe, Accessible Nitroglycerin 0.4 mg SL 1 tablet as needed - Appropriate, Effective, Safe, Accessible Ranolazine  500 mg 2 tablets in the morning and 1 tablet in the evening - Appropriate, Effective, Safe, Accessible Metoprolol succinate 25 mg 1/2 tablet daily - Appropriate, Effective, Safe, Accessible -Medications previously tried: clopidogrel  -Recommended  to continue current medication  Insomnia (Goal: improve quality and quantity of sleep) -Not ideally controlled -Current treatment  Trazodone  50 mg 1 tablet at bedtime every night - Appropriate, Effective, Safe, Accessible -Medications previously tried: melatonin (ineffective)  -Counseled on practicing good sleep hygiene by setting a sleep schedule and maintaining it, avoid excessive napping, following a nightly routine, avoiding screen time for 30-60 minutes before going to bed, and making the bedroom a cool, quiet and dark space  Glaucoma (Goal: lower intraocular pressure) -Controlled -Current treatment  Simbrinza 1-0.2% 1 drop in both eyes twice daily - Appropriate, Effective, Safe, Accessible -Medications previously tried: Lumigan -Recommended to continue current medication  Pre-diabetes (A1c goal <6.5%) -Controlled -Current medications: No medications -Medications previously tried: none  -Current home glucose readings fasting glucose: does not need to check post prandial glucose: does not need to check -Denies hypoglycemic/hyperglycemic symptoms -Current meal patterns: lots of meat; sometimes 1-2 meals a day breakfast: n/a lunch: n/a dinner: sometimes the biggest snacks: n/a drinks: n/a -Current exercise: doing physical therapy -Educated on Carbohydrate counting and/or plate method -Counseled to check feet daily and get yearly eye exams -Counseled on diet and exercise extensively  Swelling (Goal: minimize fluid retention) -Controlled -Current treatment  Furosemide 20 mg 1 tablet as needed - Appropriate, Effective, Safe, Accessible Potassium chloride 10 mEq take on days you are taking Lasix - Appropriate, Effective, Safe, Accessible -Medications previously tried: none  -Recommended to continue current medication   Health Maintenance -Vaccine gaps:COVID booster -Current therapy:  Tylenol 500 mg - taking 2 daily - depends on the need -Educated on Cost vs benefit of  each product must be carefully weighed by individual consumer -Patient is satisfied with current therapy and denies issues -Recommended to continue current medication  Patient Goals/Self-Care Activities Patient will:  - check blood pressure weekly, document, and provide at future appointments target a minimum of 150 minutes of moderate intensity exercise weekly  Follow Up Plan: Telephone follow up appointment with care management team member scheduled for: 1 year       Patient verbalizes understanding of instructions and care plan provided today and agrees to view in [place]. Active MyChart status and patient understanding of how to access instructions and care plan via MyChart confirmed with patient.    Telephone follow up appointment with pharmacy team member scheduled for: 1 year  Viona Gilmore, [facility]

## 2022-06-06 NOTE — Addendum Note (Signed)
Addended by: Caprice Beaver T on: 06/06/2022 02:16 PM   Modules accepted: Orders

## 2022-06-13 ENCOUNTER — Telehealth: Payer: Self-pay | Admitting: Cardiology

## 2022-06-13 MED ORDER — RANOLAZINE ER 500 MG PO TB12: 500.0000 mg | ORAL_TABLET | Freq: Two times a day (BID) | ORAL | 3 refills | Status: DC

## 2022-06-13 NOTE — Telephone Encounter (Signed)
Pt states that he started taking Ranexa 1000 mg BID on 10/10. Pt has since been SOB. On 10/13 pt took a nitro for SOB with no relief. Pt states that on 10/16 he had Abd pain, nausea and diarrhea. His belly was tinder to the touch. On 10/19 pt c/o SOB and states that it is constant thing. On 10/25 pt tried to cut his grass with a push mower and was unable to do so with out stopping. He also states that he is unable to climb steps with out stopping. He is denying chest pain at this time. Please advise.

## 2022-06-13 NOTE — Telephone Encounter (Signed)
Pt c/o medication issue:  1. Name of Medication: ranolazine (RANEXA) 500 MG 12 hr tablet  2. How are you currently taking this medication (dosage and times per day)? As prescribed   3. Are you having a reaction (difficulty breathing--STAT)? Yes  4. What is your medication issue? Patient began having abdominal pain and nausea 6 days after increasing this medication. On 10/24 he also began having SOB both moving around and sitting down as well as weakness in right arm. He reports he took Nitroglycerin 10/13, 10/19, & 10/27, but it did not help with SOB like it normally does. He described the SOB as not being able to take a full breath and states he had to stop several times when mowing his lawn, which is not normal for him. Please advise.

## 2022-06-13 NOTE — Telephone Encounter (Signed)
Go back to the previous dosing of Ranexa.    [facility]    RN called patient the above direction from Dr Ellyn Hack .   Medication list changed  If symptoms persist after 2- 3 weeks call office. Patient verbalized understanding.

## 2022-06-21 ENCOUNTER — Other Ambulatory Visit: Payer: Self-pay | Admitting: Family Medicine

## 2022-06-21 DIAGNOSIS — E785 Hyperlipidemia, unspecified: Secondary | ICD-10-CM

## 2022-06-21 DIAGNOSIS — I10 Essential (primary) hypertension: Secondary | ICD-10-CM

## 2022-06-27 ENCOUNTER — Telehealth: Payer: Self-pay

## 2022-06-27 NOTE — Progress Notes (Signed)
  Chronic Care Management   Note  06/27/2022 Name: Luis Guzman MRN: [id number] DOB: 06-Feb-1946  COPELAN MAULTSBY is a 76 y.o. year old male who is a primary care patient of Burchette, Alinda Sierras, MD. I reached out to [name] by phone today in response to a referral sent by Mr. Amr [facility] PCP.  Mr. KEMAURI MUSA was not successfully contacted today. A HIPAA compliant voice message was left requesting a return call.   Follow up plan: Additional outreach attempts will be made.  Noreene Larsson, [place], [place] [address] Direct Dial: [phone] [email or web address]

## 2022-06-27 NOTE — Progress Notes (Signed)
  Chronic Care Management   Note  06/27/2022 Name: Luis Guzman MRN: [id number] DOB: 1946-02-28  Luis Guzman is a 76 y.o. year old male who is a primary care patient of Burchette, Alinda Sierras, MD. I reached out to [name] by phone today in response to a referral sent by Luis Guzman PCP.  Luis Guzman  agreedto scheduling an appointment with the CCM RN Case Manager   Follow up plan: Patient agreed to scheduled appointment with RN Case Manager on 08/22/2021.   Noreene Larsson, [place], [place] [address] Direct Dial: [phone] [email or web address]

## 2022-08-16 ENCOUNTER — Other Ambulatory Visit (HOSPITAL_BASED_OUTPATIENT_CLINIC_OR_DEPARTMENT_OTHER): Payer: Self-pay

## 2022-08-16 MED ORDER — AREXVY 120 MCG/0.5ML IM SUSR
0.5000 mL | Freq: Once | INTRAMUSCULAR | 0 refills | Status: AC
  Filled 2022-08-16: qty 0.5, 1d supply, fill #0

## 2022-08-19 ENCOUNTER — Other Ambulatory Visit (HOSPITAL_BASED_OUTPATIENT_CLINIC_OR_DEPARTMENT_OTHER): Payer: Self-pay

## 2022-08-22 ENCOUNTER — Ambulatory Visit (INDEPENDENT_AMBULATORY_CARE_PROVIDER_SITE_OTHER): Payer: Medicare Other

## 2022-08-22 DIAGNOSIS — E785 Hyperlipidemia, unspecified: Secondary | ICD-10-CM

## 2022-08-22 DIAGNOSIS — I10 Essential (primary) hypertension: Secondary | ICD-10-CM

## 2022-08-22 NOTE — Chronic Care Management (AMB) (Signed)
Chronic Care Management   CCM RN Visit Note  08/22/2022 Name: Luis Guzman MRN: [id number] DOB: 1946/02/04  Subjective: Luis Guzman is a 77 y.o. year old male who is a primary care patient of Burchette, Alinda Sierras, MD. The patient was referred to the Chronic Care Management team for assistance with care management needs subsequent to provider initiation of CCM services and plan of care.    Today's Visit:  Engaged with patient by telephone for initial visit.     SDOH Interventions Today    Flowsheet Row Most Recent Value  SDOH Interventions   Food Insecurity Interventions Intervention Not Indicated  Housing Interventions Intervention Not Indicated  Transportation Interventions Intervention Not Indicated  Utilities Interventions Intervention Not Indicated  Alcohol Usage Interventions Intervention Not Indicated (Score <7)  Financial Strain Interventions Intervention Not Indicated  Stress Interventions Intervention Not Indicated  Social Connections Interventions Intervention Not Indicated          Goals Addressed             This Visit's Progress    Goal: CCM (Hyperlipidemia) Expected Outcome:  Monitor, Self-Manage And Reduce Symptoms of Hyperlipidemia       Current Barriers:  Chronic Disease Management support and education needs related to Hyperlipidemia  Planned Interventions: Reviewed plan for management of hyperlipidemia. Discussed established cholesterol goals. Reviewed medications. Reports taking medications as prescribed. Currently being managed with Praluent injections. Discussed importance of regular laboratory monitoring as prescribed. Reviewed importance of limiting foods high in cholesterol. Advised to continue readings nutrition labels and avoiding highly processed foods when possible. Reviewed activity goals. Encouraged to continue engaging in low impact exercises as tolerated. Assessed social determinant of health barriers. Reviewed worsening s/sx that require  immediate medical attention.   Lab Results  Component Value Date   CHOL 144 05/20/2022   HDL 41.90 05/20/2022   LDLCALC 73 05/20/2022   LDLDIRECT 129.0 08/19/2016   TRIG 148.0 05/20/2022   CHOLHDL 3 05/20/2022    Symptom Management: Take medications as prescribed   Complete labs as ordered Attend all scheduled provider appointments Call doctor with any symptoms you believe are related to your medicine Adhere to recommended cardiac prudent/heart healthy diet Call provider office for new concerns or questions   Follow Up Plan:  Will follow up in three months       Goal: CCM (Hypertension) Expected Outcome:  Monitor, Self-Manage And Reduce Symptoms of Hypertension       Current Barriers:  Chronic Disease Management support and education needs related to Hypertension  Planned Interventions: Reviewed current treatment plan related to Hypertension, self-management, and patient's adherence to plan as established by provider.  Reviewed medications and indications for use. Reports taking all medications as prescribed. Reports tolerating current regimen well. Denies current concerns related to medication management and prescription cost. Provided information regarding established blood pressure parameters along with indications for notifying a provider. Advised to monitor and record reading. Reviewed symptoms. Denies chest pain or palpitations. Denies headaches, dizziness, or visual changes. Reports occasional edema in lower extremities. Discussed activity tolerance. Experiences dyspnea with mild to moderate exertion. Reports this has been a chronic issue. Able to complete ADLs and IADLs independently. Discussed compliance with recommended cardiac prudent diet. Encouraged to continue readings nutrition labels, monitor sodium intake, and avoid highly processed foods when possible.  Reviewed s/sx of heart attack, stroke and worsening symptoms that require immediate medical  attention.     BP Readings from Last 3 Encounters:  05/23/22 120/68  05/20/22  114/60  03/21/22 (!) 141/75    Symptom Management Take all medications as prescribed Attend all scheduled provider appointments Call pharmacy for medication refills 3-7 days in advance of running out of medications Call provider office for new concerns or questions  Check blood pressure  Keep a blood pressure log Call doctor for signs and symptoms of high blood pressure Continue engaging in exercises and mild activity as tolerated Read nutrition labels. Eat whole grains, fruits and vegetables, lean meats and healthy fats Limit salt intake  Report new symptoms to your doctor   Follow Up Plan: Will follow up in three months            PLAN: A member of the care management team will follow up in three months   [place] Manager/Chronic Care Management [phone]

## 2022-08-22 NOTE — Patient Instructions (Addendum)
Thank you for allowing the Chronic Care Management team to participate in your care. It was great speaking with you!   Our next outreach is scheduled for December 13, 2022 at 1000.  Please do not hesitate to contact me if you require assistance prior to our next outreach. Please call the care guide team at [phone] if you need to cancel or reschedule your appointment.     Following is a copy of the CCM Program Consent:  CCM service includes personalized support from designated clinical staff supervised by the physician, including individualized plan of care and coordination with other care providers 24/7 contact phone numbers for assistance for urgent and routine care needs. Service will only be billed when office clinical staff spend 20 minutes or more in a month to coordinate care. Only one practitioner may furnish and bill the service in a calendar month. The patient may stop CCM services at amy time (effective at the end of the month) by phone call to the office staff. The patient will be responsible for cost sharing (co-pay) or up to 20% of the service fee (after annual deductible is met)   Following is a copy of your full provider care plan:   Goals Addressed             This Visit's Progress    Goal: CCM (Hyperlipidemia) Expected Outcome:  Monitor, Self-Manage And Reduce Symptoms of Hyperlipidemia       Current Barriers:  Chronic Disease Management support and education needs related to Hyperlipidemia  Planned Interventions: Reviewed plan for management of hyperlipidemia. Discussed established cholesterol goals. Reviewed medications. Reports taking medications as prescribed. Currently being managed with Praluent injections. Discussed importance of regular laboratory monitoring as prescribed. Reviewed importance of limiting foods high in cholesterol. Advised to continue readings nutrition labels and avoiding highly processed foods when possible. Reviewed activity goals.  Encouraged to continue engaging in low impact exercises as tolerated. Assessed social determinant of health barriers. Reviewed worsening s/sx that require immediate medical attention.   Lab Results  Component Value Date   CHOL 144 05/20/2022   HDL 41.90 05/20/2022   LDLCALC 73 05/20/2022   LDLDIRECT 129.0 08/19/2016   TRIG 148.0 05/20/2022   CHOLHDL 3 05/20/2022    Symptom Management: Take medications as prescribed   Complete labs as ordered Attend all scheduled provider appointments Call doctor with any symptoms you believe are related to your medicine Adhere to recommended cardiac prudent/heart healthy diet Call provider office for new concerns or questions   Follow Up Plan:  Will follow up in three months       Goal: CCM (Hypertension) Expected Outcome:  Monitor, Self-Manage And Reduce Symptoms of Hypertension       Current Barriers:  Chronic Disease Management support and education needs related to Hypertension  Planned Interventions: Reviewed current treatment plan related to Hypertension, self-management, and patient's adherence to plan as established by provider.  Reviewed medications and indications for use. Reports taking all medications as prescribed. Reports tolerating current regimen well. Denies current concerns related to medication management and prescription cost. Provided information regarding established blood pressure parameters along with indications for notifying a provider. Advised to monitor and record reading. Reviewed symptoms. Denies chest pain or palpitations. Denies headaches, dizziness, or visual changes. Reports occasional edema in lower extremities. Discussed activity tolerance. Experiences dyspnea with mild to moderate exertion. Reports this has been a chronic issue. Able to complete ADLs and IADLs independently. Discussed compliance with recommended cardiac prudent diet. Encouraged to continue  readings nutrition labels, monitor sodium intake, and  avoid highly processed foods when possible.  Reviewed s/sx of heart attack, stroke and worsening symptoms that require immediate medical attention.     BP Readings from Last 3 Encounters:  05/23/22 120/68  05/20/22 114/60  03/21/22 (!) 141/75    Symptom Management Take all medications as prescribed Attend all scheduled provider appointments Call pharmacy for medication refills 3-7 days in advance of running out of medications Call provider office for new concerns or questions  Check blood pressure  Keep a blood pressure log Call doctor for signs and symptoms of high blood pressure Continue engaging in exercises and mild activity as tolerated Read nutrition labels. Eat whole grains, fruits and vegetables, lean meats and healthy fats Limit salt intake  Report new symptoms to your doctor   Follow Up Plan: Will follow up in three months            [name] understanding of instructions and care plan provided today. Agrees to view in MyChart.   A member of the care management team will follow up in three months.     Luis Latino RN Care Manager/Chronic Care Management [phone]

## 2022-08-22 NOTE — Plan of Care (Signed)
Chronic Care Management Provider Comprehensive Care Plan    08/22/2022 Name: Luis Guzman MRN: [id number] DOB: 1945-11-20  Referral to Chronic Care Management (CCM) services was placed by Provider:  Carolann Littler on Date: 06/21/2022.  Chronic Condition 1: HTN Provider Assessment and Plan Essential hypertension - Primary (Chronic)     Relevant Orders    CBC with Differential/Platelet    CMP    Expected Outcome/Goals Addressed This Visit (Provider CCM goals/Provider Assessment and plan  Goal: CCM (Hypertension) Expected Outcome:  Monitor, Self-Manage And Reduce Symptoms of Hypertension  Symptom Management Condition 1: Take all medications as prescribed Attend all scheduled provider appointments Call pharmacy for medication refills 3-7 days in advance of running out of medications Call provider office for new concerns or questions  Check blood pressure  Keep a blood pressure log Call doctor for signs and symptoms of high blood pressure Continue engaging in exercises and mild activity as tolerated Read nutrition labels. Eat whole grains, fruits and vegetables, lean meats and healthy fats Limit salt intake  Report new symptoms to your doctor    Chronic Condition 2: HLD Provider Assessment and Plan    Hyperlipidemia with target LDL less than 70 - statin intolerant (Chronic)     Relevant Orders    Lipid panel    Expected Outcome/Goals Addressed This Visit (Provider CCM goals/Provider Assessment and plan  Goal: CCM (Hyperlipidemia) Expected Outcome:  Monitor, Self-Manage And Reduce Symptoms of Hyperlipidemia  Symptom Management Condition 2: Take medications as prescribed   Complete labs as ordered Attend all scheduled provider appointments Call doctor with any symptoms you believe are related to your medicine Adhere to recommended cardiac prudent/heart healthy diet Call provider office for new concerns or questions   Problem List Patient Active Problem List   Diagnosis  Date Noted   Myalgia due to statin 12/10/2020   Primary osteoarthritis of right hip 09/30/2020   OA (osteoarthritis) of hip 03/04/2020   Pain in left knee 10/30/2019   Fatigue due to treatment 07/18/2019   Coronary artery disease involving native coronary artery of native heart with angina pectoris ([place]) 06/28/2019   Preop cardiovascular exam 05/31/2019   Hypokalemia 02/27/2019   CAD S/P & DES PCI -ostial PDA and D1 02/26/2019   Atypical angina 02/14/2019   Cervical spondylosis with myelopathy 07/30/2018   AAA (abdominal aortic aneurysm) without rupture ([place]) 04/15/2016   Biliary dyskinesia 04/08/2014   Abdominal pain, epigastric 04/08/2014   Pancreatitis, acute 03/12/2014   Hyperglycemia 06/25/2012   ACUTE BRONCHITIS 03/11/2010   OSTEOARTHRITIS, GENERALIZED, MULTIPLE JOINTS 01/06/2010   SINUSITIS, ACUTE 06/11/2009   Hyperlipidemia with target LDL less than 70 - statin intolerant 01/26/2009   Essential hypertension 01/26/2009    Medication Management  Current Outpatient Medications:    acetaminophen (TYLENOL) 500 MG tablet, Take 1,000 mg by mouth every 6 (six) hours as needed for moderate pain., Disp: , Rfl:    Alirocumab (PRALUENT) 150 MG/ML SOAJ, Inject 150 mg into the skin every 14 (fourteen) days., Disp: 2 mL, Rfl: 11   aspirin EC 81 MG tablet, Take 1 tablet (81 mg total) by mouth daily. Swallow whole. ( start after stopping clopidogrel 75 mg), Disp: 90 tablet, Rfl: 3   bimatoprost (LUMIGAN) 0.03 % ophthalmic solution, Place 1 drop into both eyes at bedtime., Disp: , Rfl:    Brinzolamide-Brimonidine (SIMBRINZA) 1-0.2 % SUSP, Place 1 drop into both eyes in the morning and at bedtime., Disp: , Rfl:    ibuprofen (ADVIL) 200 MG tablet,  Take 400 mg by mouth every 6 (six) hours as needed for moderate pain., Disp: , Rfl:    irbesartan (AVAPRO) 150 MG tablet, Take 1 tablet (150 mg total) by mouth daily., Disp: 90 tablet, Rfl: 3   isosorbide mononitrate (IMDUR) 60 MG 24 hr tablet, Take 1  tablet (60 mg total) by mouth daily., Disp: 90 tablet, Rfl: 3   metoprolol succinate (TOPROL-XL) 25 MG 24 hr tablet, TAKE 1/2 TABLET BY MOUTH EVERY DAY, Disp: 45 tablet, Rfl: 1   nitroGLYCERIN (NITROSTAT) 0.4 MG SL tablet, Place 1 tablet (0.4 mg total) under the tongue every 5 (five) minutes as needed for chest pain., Disp: 25 tablet, Rfl: 3   ranolazine (RANEXA) 500 MG 12 hr tablet, Take 1 tablet (500 mg total) by mouth 2 (two) times daily. Take 1000 mg in the morning and 500 mg in the evening, Disp: 270 tablet, Rfl: 3   amLODipine (NORVASC) 5 MG tablet, Take 1 tablet (5 mg total) by mouth daily. (Patient taking differently: Take 5 mg by mouth every evening.), Disp: 90 tablet, Rfl: 3   furosemide (LASIX) 20 MG tablet, Take  20 to 40 mg daily as needed for shortness of breath, wt gain, swelling., Disp: 180 tablet, Rfl: 3   potassium chloride SA (KLOR-CON M) 20 MEQ tablet, Take 10 mEq (0.5 tablets) on days that you take lasix., Disp: 90 tablet, Rfl: 1   traZODone (DESYREL) 50 MG tablet, Take 50 mg by mouth at bedtime., Disp: , Rfl:   Cognitive Assessment Identity Confirmed: : Name; DOB Cognitive Status: Normal   Functional Assessment Hearing Difficulty or Deaf: yes Hearing Management: Has hearing aides Wear Glasses or Blind: yes Vision Management: Wears Prescription Concentrating, Remembering or Making Decisions Difficulty (CP): no Difficulty Communicating: no Difficulty Eating/Swallowing: no Walking or Climbing Stairs Difficulty: yes Walking or Climbing Stairs: -- (D/t shortness of breath with exertion) Mobility Management: D/t shortness of breath with exertion. Reports taking breaks as needed. Dressing/Bathing Difficulty: no Doing Errands Independently Difficulty (such as shopping) (CP): no Change in Functional Status Since Onset of Current Illness/Injury: no   Caregiver Assessment  Primary Source of Support/Comfort: friend; spouse People in Home: spouse Name(s) of People in Home:  [place] if Needed: none   Planned Interventions  HTN Reviewed current treatment plan related to Hypertension, self-management, and patient's adherence to plan as established by provider.  Reviewed medications and indications for use. Reports taking all medications as prescribed. Reports tolerating current regimen well. Denies current concerns related to medication management and prescription cost. Provided information regarding established blood pressure parameters along with indications for notifying a provider. Advised to monitor and record reading. Reviewed symptoms. Denies chest pain or palpitations. Denies headaches, dizziness, or visual changes. Reports occasional edema in lower extremities. Discussed activity tolerance. Experiences dyspnea with mild to moderate exertion. Reports this has been a chronic issue. Able to complete ADLs and IADLs independently. Discussed compliance with recommended cardiac prudent diet. Encouraged to continue readings nutrition labels, monitor sodium intake, and avoid highly processed foods when possible.  Reviewed s/sx of heart attack, stroke and worsening symptoms that require immediate medical attention.  HLD Reviewed plan for management of hyperlipidemia. Discussed established cholesterol goals. Reviewed medications. Reports taking medications as prescribed. Currently being managed with Praluent injections. Discussed importance of regular laboratory monitoring as prescribed. Reviewed importance of limiting foods high in cholesterol. Advised to continue readings nutrition labels and avoiding highly processed foods when possible. Reviewed activity goals. Encouraged to continue engaging in  low impact exercises as tolerated. Assessed social determinant of health barriers. Reviewed worsening s/sx that require immediate medical attention.    Interaction and coordination with outside resources, practitioners, and providers See CCM  Referral  Care Plan: Available in MyChart

## 2022-09-02 ENCOUNTER — Other Ambulatory Visit (HOSPITAL_COMMUNITY): Payer: Self-pay

## 2022-09-14 DIAGNOSIS — E785 Hyperlipidemia, unspecified: Secondary | ICD-10-CM

## 2022-09-14 DIAGNOSIS — I10 Essential (primary) hypertension: Secondary | ICD-10-CM | POA: Diagnosis not present

## 2022-09-23 ENCOUNTER — Other Ambulatory Visit (HOSPITAL_BASED_OUTPATIENT_CLINIC_OR_DEPARTMENT_OTHER): Payer: Self-pay

## 2022-09-23 MED ORDER — COMIRNATY 30 MCG/0.3ML IM SUSY
PREFILLED_SYRINGE | INTRAMUSCULAR | 0 refills | Status: DC
  Filled 2022-09-23: qty 0.3, 1d supply, fill #0

## 2022-10-10 ENCOUNTER — Ambulatory Visit (HOSPITAL_COMMUNITY): Payer: Medicare Other | Attending: Cardiology

## 2022-10-10 DIAGNOSIS — I359 Nonrheumatic aortic valve disorder, unspecified: Secondary | ICD-10-CM | POA: Diagnosis present

## 2022-10-10 LAB — ECHOCARDIOGRAM COMPLETE
Area-P 1/2: 2.55 cm2
P 1/2 time: 593 msec
S' Lateral: 3 cm

## 2022-10-14 ENCOUNTER — Telehealth (INDEPENDENT_AMBULATORY_CARE_PROVIDER_SITE_OTHER): Payer: Medicare Other | Admitting: Family Medicine

## 2022-10-14 ENCOUNTER — Telehealth: Payer: Self-pay | Admitting: Family Medicine

## 2022-10-14 DIAGNOSIS — Z20828 Contact with and (suspected) exposure to other viral communicable diseases: Secondary | ICD-10-CM | POA: Diagnosis not present

## 2022-10-14 DIAGNOSIS — R6883 Chills (without fever): Secondary | ICD-10-CM | POA: Diagnosis not present

## 2022-10-14 DIAGNOSIS — R051 Acute cough: Secondary | ICD-10-CM

## 2022-10-14 DIAGNOSIS — R52 Pain, unspecified: Secondary | ICD-10-CM | POA: Diagnosis not present

## 2022-10-14 MED ORDER — PROMETHAZINE-DM 6.25-15 MG/5ML PO SYRP: 5.0000 mL | ORAL_SOLUTION | Freq: Four times a day (QID) | ORAL | 0 refills | Status: DC | PRN

## 2022-10-14 MED ORDER — OSELTAMIVIR PHOSPHATE 75 MG PO CAPS: 75.0000 mg | ORAL_CAPSULE | Freq: Two times a day (BID) | ORAL | 0 refills | Status: DC

## 2022-10-14 NOTE — Telephone Encounter (Signed)
Pt is calling and was sent to triage nurse and his wife was dx with flu  yesterday and he is having cough and chest congestion per pt triage nurse told him to call and ask for tamiflu . There is no openings  [id number] [place], [place] - [address]: [phone] Fax: [phone]

## 2022-10-14 NOTE — Telephone Encounter (Signed)
Patient scheduled today with Harland Dingwall PA

## 2022-10-14 NOTE — Progress Notes (Signed)
MyChart Video Visit    Virtual Visit via Video Note    Patient location: Home. Patient and provider in visit Provider location: Office  I discussed the limitations of evaluation and management by telemedicine and the availability of in person appointments. The patient expressed understanding and agreed to proceed.  Visit Date: 10/14/2022  Today's healthcare provider: Harland Dingwall, NP-C     Subjective:    Patient ID: Luis Guzman, male    DOB: November 19, 1945, 77 y.o.   MRN: [id number]  Chief Complaint  Patient presents with   Cough    Started last night, Wife tested positive for flu yesterday, wanted to get something started for the weekend. Having same symptoms as her   Nasal Congestion   Chills    Saw triage nurse and could not prescribe tamaflu without being seen    HPI  C/o chills, body aches, headache, rhinorrhea, NP cough since last night.  Wife diagnosed with flu yesterday. Requesting Tamiflu.   Taking ibuprofen and Mucinex.   No dizziness, N/V/D. No chest pain or shortness of breath outside of his norm.    Past Medical History:  Diagnosis Date   AAA (abdominal aortic aneurysm) ([place])    Anemia    as infant history of   CAD S/P PTCA & DES PCI - for Progressive Angina 02/26/2019   Cath-PCI 02/26/2019: EF 55-65%. mCx 65% (FFR 0.82 - Med Rx). D2 85% - Wolverine Scoring Balloon PTCA (2.0 mm). Ost rPDA - 90% -Resolute Onyx 2.5 x 15 (2.6 mm). 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 lower back pain, s/p cervical neck surgery"stiffness" remains    Transfusion history    infant "anemia"    Past Surgical History:  Procedure Laterality Date   APPENDECTOMY  ~ [place] N/A 07/30/2018   Procedure: Cervical five-six Cervical six-seven Artificial disc replacement;  Surgeon: Kristeen Miss, MD;  Location: [facility];  Service: Neurosurgery;  Laterality: N/A;   CERVICAL LAMINECTOMY  1997   C 4 and C 5   CHOLECYSTECTOMY N/A 04/15/2014   Procedure: LAPAROSCOPIC CHOLECYSTECTOMY WITH INTRAOPERATIVE CHOLANGIOGRAM;  Surgeon: Armandina Gemma, MD;  Location: [facility];  Service: General;  Laterality: N/A;   COLONOSCOPY     CORONARY BALLOON ANGIOPLASTY N/A 02/26/2019   Procedure: CORONARY BALLOON ANGIOPLASTY;  Surgeon: Leonie Man, MD;  Location: [facility];  Service: Cardiovascular -> scoring balloon PTCA (Wolverine 2.0 mm) of ostial D2 85% reducing to 20-30%.   CORONARY STENT INTERVENTION N/A 02/26/2019   Procedure: CORONARY STENT INTERVENTION;  Surgeon: Leonie Man, MD;  Location: [facility];; PCI ostial RPDA (initial attempt of PTCA only led to small local tear/dissection covered with stent) Resolute Onyx 2.5 mm x 15 mm (2.6 mm   CORONARY STENT INTERVENTION N/A 07/05/2019   Procedure: CORONARY STENT INTERVENTION;  Surgeon: Leonie Man, MD;  Location: [facility];  Service: Cardiovascular;; ostial D1 95% (restenosis of PTCA  site) -> DES PCI RESOLUTE ONYX 2.25 mm x 50 mm (2.6 mm) positioned to avoid the true ostium.    ESOPHAGOGASTRODUODENOSCOPY N/A 03/13/2014   Procedure: ESOPHAGOGASTRODUODENOSCOPY (EGD);  Surgeon: Wonda Horner, MD;  Location[facility];  Service: Endoscopy;  Laterality: N/A;   INTRAVASCULAR PRESSURE WIRE/FFR STUDY N/A 02/26/2019   Procedure: INTRAVASCULAR PRESSURE WIRE/FFR STUDY;  Surgeon: Leonie Man, MD;  Location: [facility];  Service: Cardiovascular;; mCx ~65% - DFR 0.92, FR 0.82 - BORDERLINE--> MED Rx   INTRAVASCULAR PRESSURE WIRE/FFR STUDY N/A  11/10/2021   Procedure: INTRAVASCULAR PRESSURE WIRE/FFR STUDY;  Surgeon: Leonie Man, MD;  Location: [facility] [place] [facility];  Service: Cardiovascular;  Laterality: N/A;   LEFT HEART CATH AND CORONARY ANGIOGRAPHY N/A 02/26/2019   Procedure: LEFT HEART CATH AND CORONARY ANGIOGRAPHY;  Surgeon: Leonie Man, MD;  Location: [facility];  EF 55-65%. mCx 65% (FFR 0.82 - Med Rx). D2 85% - Wolverine Scoring Balloon PTCA (2.0 mm). Ost rPDA - 90% -Resolute Onyx 2.5 x 15 (2.6 mm).    LEFT HEART CATH AND CORONARY ANGIOGRAPHY N/A 07/05/2019   Procedure: LEFT HEART CATH AND CORONARY ANGIOGRAPHY;  Surgeon: Leonie Man, MD;  Location: [facility]ASIVE [facility];; ostial D1 95% (restenosis of PTCA site) -> DES PCI.  PDA stent widely patent.  Mid CX stable 65% lesion.  Proximal RCA 40%.  Mid RCA 45%.  Normal EDP.   LEFT HEART CATH AND CORONARY ANGIOGRAPHY N/A 11/10/2021   Procedure: LEFT HEART CATH AND CORONARY ANGIOGRAPHY;  Surgeon: Leonie Man, MD;  Location: [facility];  Service: CV:: : Stable mid LCx with 5% (RFR 0.95).  Stable proximal RCA 40% and mid RCA 55%.  D1 and RPDA stents widely patent (   NM MYOVIEW LTD  10/20/2021   Horizontal ST depressions in V4 and V5, frequent exercise-induced PVCs.  Inferior patient defect improves with stress-consistent with artifact.  Positive EKG, negative perfusion imaging.  Read as moderate risk study because of exertional ectopy.  Normal EF.   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   SHOULDER ARTHROSCOPY Bilateral    SHOULDER ARTHROSCOPY W/ ROTATOR CUFF REPAIR Bilateral 2004-2009   left 2004, right 2009   TOTAL HIP ARTHROPLASTY Left 03/04/2020   Procedure: TOTAL HIP ARTHROPLASTY ANTERIOR APPROACH;  Surgeon: Gaynelle Arabian, MD;  [facility];  Service: Orthopedics;  Laterality: Left;  146mn   TOTAL HIP ARTHROPLASTY Right 09/30/2020   Procedure: TOTAL HIP ARTHROPLASTY ANTERIOR APPROACH;  Surgeon: AGaynelle Arabian MD;  [facility];  Service: Orthopedics;  Laterality: Right;  1017m   TRANSTHORACIC ECHOCARDIOGRAM  02/2020; 10/01/2020   a) EF 55 to 60%.  GR 1 DD.  No R WMA.  Trivial MR.  Otherwise normal.; b) EF 55 to 60%.  Normal LV function and no wall motion abnormality.  Normal RV.  Normal valves..   TRANSTHORACIC ECHOCARDIOGRAM  10/18/2021   Normal LV function with a EF of 65%.  No RWMA.  GR 1 DD.  Normal valves.  Mild concentric LVH.  Mild MR and mild-moderate AI.   VASECTOMY     WISDOM TOOTH EXTRACTION      Family History  Problem Relation Age of Onset   Hypertension Mother    Hypertension Father    Arthritis Sister        rheumatoid   Stroke Sister [id number]  Social History   Socioeconomic History   Marital status: Married  Spouse name: Not on file   Number of children: Not on file   Years of education: Not on file   Highest education level: Not on file  Occupational History   Not on file  Tobacco Use   Smoking status: Former    Packs/day: 1.00    Years: 5.00    Total pack years: 5.00    Types: Cigarettes    Quit date: 10/17/1968    Years since quitting: 54.0   Smokeless tobacco: Never  Vaping Use   Vaping Use: Never used  Substance and Sexual Activity   Alcohol use: Not Currently    Comment: "quit drinking ~ 10/1968   Drug use: No   Sexual activity: Not on file  Other Topics Concern   Not on file  Social History Narrative   Not on file   Social Determinants of Health   Financial Resource Strain: Low Risk  (08/22/2022)   Overall Financial Resource Strain (CARDIA)    Difficulty of Paying Living Expenses: Not very hard  Food Insecurity: No Food Insecurity (08/22/2022)   Hunger Vital Sign    Worried About Running Out of Food in the Last Year: Never true    [place] in the Last Year: Never true  Transportation Needs: No Transportation Needs (08/22/2022)   PRAPARE - Hydrologist (Medical): No    Lack of Transportation (Non-Medical): No  Physical Activity:  Inactive (05/16/2022)   Exercise Vital Sign    Days of Exercise per Week: 0 days    Minutes of Exercise per Session: 0 min  Stress: No Stress Concern Present (08/22/2022)   [place]    Feeling of Stress : Not at all  Social Connections: Moderately Integrated (08/22/2022)   Social Connection and Isolation Panel [NHANES]    Frequency of Communication with Friends and Family: More than three times a week    Frequency of Social Gatherings with Friends and Family: More than three times a week    Attends Religious Services: Never    Marine scientist or Organizations: Yes    Attends Archivist Meetings: Never    Marital Status: Married  Human resources officer Violence: Not At Risk (05/07/2021)   Humiliation, Afraid, Rape, and Kick questionnaire    Fear of Current or Ex-Partner: No    Emotionally Abused: No    Physically Abused: No    Sexually Abused: No    Outpatient Medications Prior to Visit  Medication Sig Dispense Refill   acetaminophen (TYLENOL) 500 MG tablet Take 1,000 mg by mouth every 6 (six) hours as needed for moderate pain.     Alirocumab (PRALUENT) 150 MG/ML SOAJ Inject 150 mg into the skin every 14 (fourteen) days. 2 mL 11   amLODipine (NORVASC) 5 MG tablet Take 1 tablet (5 mg total) by mouth daily. (Patient taking differently: Take 5 mg by mouth every evening.) 90 tablet 3   aspirin EC 81 MG tablet Take 1 tablet (81 mg total) by mouth daily. Swallow whole. ( start after stopping clopidogrel 75 mg) 90 tablet 3   bimatoprost (LUMIGAN) 0.03 % ophthalmic solution Place 1 drop into both eyes at bedtime.     Brinzolamide-Brimonidine (SIMBRINZA) 1-0.2 % SUSP Place 1 drop into both eyes in the morning and at bedtime.     furosemide (LASIX) 20 MG tablet Take  20 to 40 mg daily as needed for shortness of breath, wt gain, swelling.  180 tablet 3   ibuprofen (ADVIL) 200 MG tablet Take 400 mg by mouth every 6 (six) hours  as needed for moderate pain.     irbesartan (AVAPRO) 150 MG tablet Take 1 tablet (150 mg total) by mouth daily. 90 tablet 3   isosorbide mononitrate (IMDUR) 60 MG 24 hr tablet Take 1 tablet (60 mg total) by mouth daily. 90 tablet 3   metoprolol succinate (TOPROL-XL) 25 MG 24 hr tablet TAKE 1/2 TABLET BY MOUTH EVERY DAY 45 tablet 1   nitroGLYCERIN (NITROSTAT) 0.4 MG SL tablet Place 1 tablet (0.4 mg total) under the tongue every 5 (five) minutes as needed for chest pain. 25 tablet 3   potassium chloride SA (KLOR-CON M) 20 MEQ tablet Take 10 mEq (0.5 tablets) on days that you take lasix. 90 tablet 1   ranolazine (RANEXA) 500 MG 12 hr tablet Take 1 tablet (500 mg total) by mouth 2 (two) times daily. Take 1000 mg in the morning and 500 mg in the evening 270 tablet 3   traZODone (DESYREL) 50 MG tablet Take 50 mg by mouth at bedtime.     COVID-19 mRNA vaccine 2023-2024 (COMIRNATY) syringe Inject into the muscle. (Patient not taking: Reported on 10/14/2022) 0.3 mL 0   No facility-administered medications prior to visit.    Allergies  Allergen Reactions   Statins     Muscle pain   Penicillins Itching    Has patient had a PCN reaction causing immediate rash, facial/tongue/throat swelling, SOB or lightheadedness with hypotension: no Has patient had a PCN reaction causing severe rash involving mucus membranes or skin necrosis: unkn Has patient had a PCN reaction that required hospitalization: no Has patient had a PCN reaction occurring within the last 10 years: no If all of the above answers are "NO", then may proceed with Cephalosporin use.  Tolerated Cephalosporin Date: 10/01/20.    ROS     Objective:    Physical Exam  There were no vitals taken for this visit. Wt Readings from Last 3 Encounters:  05/23/22 190 lb 12.8 oz (86.5 kg)  05/20/22 189 lb 8 oz (86 kg)  05/16/22 186 lb (84.4 kg)   Alert and oriented and in no acute distress.  Respirations unlabored.  Congested cough during the  visit.  Able to speak in complete sentences.    Assessment & Plan:   Problem List Items Addressed This Visit   None Visit Diagnoses     Exposure to the flu    -  Primary   Relevant Medications   oseltamivir (TAMIFLU) 75 MG capsule   Acute cough       Relevant Medications   oseltamivir (TAMIFLU) 75 MG capsule   promethazine-dextromethorphan (PROMETHAZINE-DM) 6.25-15 MG/5ML syrup   Chills       Relevant Medications   oseltamivir (TAMIFLU) 75 MG capsule   Body aches       Relevant Medications   oseltamivir (TAMIFLU) 75 MG capsule      Tamiflu prescribed.  Discussed potential side effects.  Discussed symptomatic management.  Promethazine DM prescribed for nighttime cough and advised of sedation side effect.  Encourage good hydration and rest.  Follow-up if worsening.  I have discontinued [place]. I am also having him start on oseltamivir and promethazine-dextromethorphan. Additionally, I am having him maintain his bimatoprost, Simbrinza, traZODone, acetaminophen, aspirin EC, ibuprofen, amLODipine, potassium chloride SA, metoprolol succinate, irbesartan, furosemide, isosorbide mononitrate, Praluent, nitroGLYCERIN, and ranolazine.  Meds ordered this encounter  Medications   oseltamivir (  TAMIFLU) 75 MG capsule    Sig: Take 1 capsule (75 mg total) by mouth 2 (two) times daily.    Dispense:  10 capsule    Refill:  0    Order Specific Question:   Supervising Provider    Answer:   Pricilla Holm A [[id number]]   promethazine-dextromethorphan (PROMETHAZINE-DM) 6.25-15 MG/5ML syrup    Sig: Take 5 mLs by mouth 4 (four) times daily as needed for cough.    Dispense:  118 mL    Refill:  0    Order Specific Question:   Supervising Provider    Answer:   Pricilla Holm A [id number]    I discussed the assessment and treatment plan with the patient. The patient was provided an opportunity to ask questions and all were answered. The patient agreed with the plan and demonstrated  an understanding of the instructions.   The patient was advised to call back or seek an in-person evaluation if the symptoms worsen or if the condition fails to improve as anticipated.  I provided 15 minutes of face-to-face time during this encounter.   Harland Dingwall, NP-C [facility] [phone] (phone) [phone] (fax)  [place]

## 2022-11-15 LAB — LIPID PANEL
Chol/HDL Ratio: 2.9 ratio (ref 0.0–5.0)
Cholesterol, Total: 127 mg/dL (ref 100–199)
HDL: 44 mg/dL (ref >39)
LDL Chol Calc (NIH): 55 mg/dL (ref 0–99)
Triglycerides: 169 mg/dL — ABNORMAL HIGH (ref 0–149)
VLDL Cholesterol Cal: 28 mg/dL (ref 5–40)

## 2022-11-15 LAB — COMPREHENSIVE METABOLIC PANEL
ALT: 18 IU/L (ref 0–44)
AST: 18 IU/L (ref 0–40)
Albumin/Globulin Ratio: 2 (ref 1.2–2.2)
Albumin: 4 g/dL (ref 3.8–4.8)
Alkaline Phosphatase: 135 IU/L — ABNORMAL HIGH (ref 44–121)
BUN/Creatinine Ratio: 14 (ref 10–24)
BUN: 13 mg/dL (ref 8–27)
Bilirubin Total: 0.5 mg/dL (ref 0.0–1.2)
CO2: 24 mmol/L (ref 20–29)
Calcium: 9.5 mg/dL (ref 8.6–10.2)
Chloride: 104 mmol/L (ref 96–106)
Creatinine, Ser: 0.91 mg/dL (ref 0.76–1.27)
Globulin, Total: 2 g/dL (ref 1.5–4.5)
Glucose: 109 mg/dL — ABNORMAL HIGH (ref 70–99)
Potassium: 3.3 mmol/L — ABNORMAL LOW (ref 3.5–5.2)
Sodium: 143 mmol/L (ref 134–144)
Total Protein: 6 g/dL (ref 6.0–8.5)
eGFR: 87 mL/min/{1.73_m2} (ref >59)

## 2022-11-17 ENCOUNTER — Other Ambulatory Visit: Payer: Self-pay | Admitting: *Deleted

## 2022-11-17 DIAGNOSIS — I10 Essential (primary) hypertension: Secondary | ICD-10-CM

## 2022-11-17 MED ORDER — FUROSEMIDE 20 MG PO TABS
ORAL_TABLET | ORAL | 3 refills | Status: DC
Start: 2022-11-17 — End: 2022-11-18

## 2022-11-18 ENCOUNTER — Telehealth: Payer: Self-pay | Admitting: Cardiology

## 2022-11-18 ENCOUNTER — Other Ambulatory Visit: Payer: Self-pay

## 2022-11-18 DIAGNOSIS — I10 Essential (primary) hypertension: Secondary | ICD-10-CM

## 2022-11-18 MED ORDER — FUROSEMIDE 20 MG PO TABS
ORAL_TABLET | ORAL | 0 refills | Status: DC
Start: 2022-11-18 — End: 2022-11-28

## 2022-11-18 NOTE — Telephone Encounter (Signed)
Medication refilled and sent to desired pharmacy. Called patient and patient is aware.

## 2022-11-18 NOTE — Telephone Encounter (Signed)
*  STAT* If patient is at the pharmacy, call can be transferred to refill team.   1. Which medications need to be refilled? (please list name of each medication and dose if known)   furosemide (LASIX) 20 MG tablet   2. Which pharmacy/location (including street and city if local pharmacy) is medication to be sent to?  [id number] [place], [place] - [address] AT [address] [facility] [address]   3. Do they need a 30 day or 90 day supply?   90 day  Patient stated he is completely out of this medication and will be going to the [place] next week.

## 2022-11-18 NOTE — Addendum Note (Signed)
Addended by: Derenda Fennel on: 11/18/2022 12:05 PM   Modules accepted: Orders

## 2022-11-18 NOTE — Telephone Encounter (Signed)
Patient states that pharmacy still has not received this prescription refill. Patient would like call back for verification.

## 2022-11-22 ENCOUNTER — Ambulatory Visit (HOSPITAL_COMMUNITY)
Admission: RE | Admit: 2022-11-22 | Discharge: 2022-11-22 | Disposition: A | Discharge status: Home / Self Care | Payer: No Typology Code available for payment source | Source: Ambulatory Visit | Attending: Cardiology | Admitting: Cardiology

## 2022-11-22 DIAGNOSIS — I7143 Infrarenal abdominal aortic aneurysm, without rupture: Secondary | ICD-10-CM | POA: Insufficient documentation

## 2022-11-22 DIAGNOSIS — I25119 Atherosclerotic heart disease of native coronary artery with unspecified angina pectoris: Secondary | ICD-10-CM | POA: Insufficient documentation

## 2022-11-28 ENCOUNTER — Encounter: Payer: Self-pay | Admitting: Cardiology

## 2022-11-28 ENCOUNTER — Ambulatory Visit: Payer: Medicare Other | Attending: Cardiology | Admitting: Cardiology

## 2022-11-28 VITALS — BP 90/54 | HR 69 | Ht 73.0 in | Wt 191.6 lb

## 2022-11-28 DIAGNOSIS — Z9861 Coronary angioplasty status: Secondary | ICD-10-CM

## 2022-11-28 DIAGNOSIS — I2510 Atherosclerotic heart disease of native coronary artery without angina pectoris: Secondary | ICD-10-CM

## 2022-11-28 DIAGNOSIS — I10 Essential (primary) hypertension: Secondary | ICD-10-CM | POA: Diagnosis not present

## 2022-11-28 DIAGNOSIS — R5383 Other fatigue: Secondary | ICD-10-CM

## 2022-11-28 DIAGNOSIS — E785 Hyperlipidemia, unspecified: Secondary | ICD-10-CM | POA: Diagnosis not present

## 2022-11-28 DIAGNOSIS — I25119 Atherosclerotic heart disease of native coronary artery with unspecified angina pectoris: Secondary | ICD-10-CM

## 2022-11-28 DIAGNOSIS — R0609 Other forms of dyspnea: Secondary | ICD-10-CM

## 2022-11-28 DIAGNOSIS — I2089 Other forms of angina pectoris: Secondary | ICD-10-CM

## 2022-11-28 DIAGNOSIS — T466X5A Adverse effect of antihyperlipidemic and antiarteriosclerotic drugs, initial encounter: Secondary | ICD-10-CM

## 2022-11-28 DIAGNOSIS — M791 Myalgia, unspecified site: Secondary | ICD-10-CM

## 2022-11-28 DIAGNOSIS — I7143 Infrarenal abdominal aortic aneurysm, without rupture: Secondary | ICD-10-CM

## 2022-11-28 MED ORDER — ISOSORBIDE MONONITRATE ER 60 MG PO TB24
60.0000 mg | ORAL_TABLET | Freq: Every morning | ORAL | 3 refills | Status: DC
Start: 2022-11-28 — End: 2022-11-28

## 2022-11-28 MED ORDER — IRBESARTAN 150 MG PO TABS: 150.0000 mg | ORAL_TABLET | Freq: Every day | ORAL | 3 refills | Status: DC

## 2022-11-28 MED ORDER — RANOLAZINE ER 500 MG PO TB12: ORAL_TABLET | ORAL | 3 refills | Status: DC

## 2022-11-28 MED ORDER — METOPROLOL SUCCINATE ER 25 MG PO TB24: 12.5000 mg | ORAL_TABLET | Freq: Every day | ORAL | 3 refills | Status: DC

## 2022-11-28 MED ORDER — FUROSEMIDE 20 MG PO TABS
ORAL_TABLET | ORAL | 3 refills | Status: DC
Start: 2022-11-28 — End: 2022-12-07

## 2022-11-28 MED ORDER — POTASSIUM CHLORIDE CRYS ER 10 MEQ PO TBCR: EXTENDED_RELEASE_TABLET | ORAL | 3 refills | Status: DC

## 2022-11-28 MED ORDER — ISOSORBIDE MONONITRATE ER 60 MG PO TB24
60.0000 mg | ORAL_TABLET | Freq: Every morning | ORAL | 3 refills | Status: DC
Start: 2022-11-28 — End: 2023-04-12

## 2022-11-28 MED ORDER — AMLODIPINE BESYLATE 5 MG PO TABS: 5.0000 mg | ORAL_TABLET | Freq: Every day | ORAL | 3 refills | Status: DC

## 2022-11-28 MED ORDER — NITROGLYCERIN 0.4 MG SL SUBL: 0.4000 mg | SUBLINGUAL_TABLET | SUBLINGUAL | 6 refills | Status: DC | PRN

## 2022-11-28 NOTE — Progress Notes (Signed)
Primary Care Provider: Kristian Covey, MD [facility] Cardiologist: Bryan Lemma, MD Electrophysiologist: None  Clinic Note: No chief complaint on file.  ===================================  ASSESSMENT/PLAN   Problem List Items Addressed This Visit       Cardiology Problems   Hyperlipidemia with target LDL less than 70 - statin intolerant (Chronic)    Intolerant of statin.  Now on Praluent.  Labs seem to be pretty well-controlled with most recent LDL of 55.      Relevant Medications   amLODipine (NORVASC) 5 MG tablet   furosemide (LASIX) 20 MG tablet   irbesartan (AVAPRO) 150 MG tablet   isosorbide mononitrate (IMDUR) 60 MG 24 hr tablet   metoprolol succinate (TOPROL-XL) 25 MG 24 hr tablet   nitroGLYCERIN (NITROSTAT) 0.4 MG SL tablet   ranolazine (RANEXA) 500 MG 12 hr tablet   Other Relevant Orders   EKG 12-Lead (Completed)   Essential hypertension (Chronic)    Labile blood pressure goes up and down.  We reduce his amlodipine dose, recommend that we split the amlodipine and irbesartan dosing.  Plan: Continue amlodipine.  Monitor for blood pressures if the pressure is low elevated times a day he will either not take the amlodipine or the irbesartan that day. He continue Toprol at 12.5 mg daily. 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  Still having chest discomfort which is plus or minus exertional.  He is on pretty stable  regimen.  Will evaluate with CPX.   For now continue current dose of Toprol and assess chronotropic competence with CPX. On irbesartan and amlodipine-recommend taking it at different times of the day.  Monitor BP. Switch dosing of Imdur to a.m. Continue Ranexa 500 mg twice daily. Continue Praluent with statin intolerance/myalgias and memory loss. Continue aspirin      Relevant Medications   amLODipine (NORVASC) 5 MG tablet   furosemide (LASIX) 20 MG tablet   irbesartan (AVAPRO) 150 MG tablet   isosorbide mononitrate (IMDUR) 60 MG 24 hr tablet   metoprolol succinate (TOPROL-XL) 25 MG 24 hr tablet   nitroGLYCERIN (NITROSTAT) 0.4 MG SL tablet   ranolazine (RANEXA) 500 MG 12 hr tablet   Other Relevant Orders   EKG 12-Lead (Completed)   Cardiac Stress Test: Informed Consent Details: Physician/Practitioner Attestation; Transcribe to consent form and obtain patient signature   CAD S/P & DES PCI -ostial PDA and D1 (Chronic)    Now on aspirin monotherapy for two-vessel PCI.  Had significant bruising on Plavix.  Okay to hold aspirin for surgical procedures 5 to 7 days preop      Relevant Medications   amLODipine (NORVASC) 5 MG tablet   furosemide (LASIX) 20 MG tablet   irbesartan (AVAPRO) 150 MG tablet   isosorbide mononitrate (IMDUR) 60 MG 24 hr tablet   metoprolol succinate (TOPROL-XL) 25 MG 24 hr tablet   nitroGLYCERIN (NITROSTAT) 0.4 MG SL tablet   ranolazine (  RANEXA) 500 MG 12 hr tablet   Other Relevant Orders   EKG 12-Lead (Completed)   Cardiac Stress Test: Informed Consent Details: Physician/Practitioner Attestation; Transcribe to consent form and obtain patient signature   Atypical angina - Primary (Chronic)    He continues to have these episodes where he is using nitroglycerin several doses at a time.  Did not really get much benefit/note to tolerate the higher dose of Ranexa. Episodes are usually during the day.  Since the episode seem to be nitro responsive it would  suggest that maybe there is a spasm component.  8 we have done a cardiac catheterization in the past year showing no significant lesions with patent stents.  At this point, I am not sure if the symptoms are truly related angina or related to pulmonary issues.  Plan: Evaluate with CPX Shared Decision Making/Informed Consent The risks [chest pain, shortness of breath, cardiac arrhythmias, dizziness, blood pressure fluctuations, myocardial infarction, stroke/transient ischemic attack, and life-threatening complications (estimated to be 1 in 10,000)], benefits (risk stratification, diagnosing coronary artery disease, treatment guidance) and alternatives of an exercise tolerance test were discussed in detail with Mr. [name] and he agrees to proceed. Change Imdur from nighttime to morning since his symptoms are usually during the day.-If he does use nitroglycerin 1 day he should increase Imdur dose still at 120 mg for couple days. Continue Ranexa 500 mg twice daily He is on Toprol 12.5 mg daily, heart rate 69.  Should be okay, but need to evaluate chronotropic competence.  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 Continue current dose of amlodipine although blood pressure being low is concerning.  Recommend that he takes amlodipine and irbesartan at several times a day. Continue ARB for afterload reduction, but will need to watch his blood pressures closely. If CPX is not helpful, make me consider Stress PET       Relevant Medications   amLODipine (NORVASC) 5 MG tablet   furosemide (LASIX) 20 MG tablet   irbesartan (AVAPRO) 150 MG tablet   isosorbide mononitrate (IMDUR) 60 MG 24 hr tablet   metoprolol succinate (TOPROL-XL) 25 MG 24 hr tablet   nitroGLYCERIN (NITROSTAT) 0.4 MG SL tablet   ranolazine (RANEXA) 500 MG 12 hr tablet   Other Relevant Orders   EKG 12-Lead (Completed)   Cardiac Stress Test: Informed Consent Details: Physician/Practitioner Attestation; Transcribe to  consent form and obtain patient signature   Cardiopulmonary exercise test   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  No evidence of dilation.  It looks like the previously recorded number was probably not accurate.      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  Doing well on Praluent.      Relevant Orders   EKG 12-Lead (Completed)   Fatigue due to treatment (Chronic)    We backed off on a beta-blocker to see if there is any potential chronotropic incompetence related symptoms.  Has not really changed.  Will see what his exercise tolerance is CPX.      Relevant Orders  EKG 12-Lead (Completed)   Cardiac Stress Test: Informed Consent Details: Physician/Practitioner Attestation; Transcribe to consent form and obtain patient signature   Other Visit Diagnoses     Dyspnea on exertion       Relevant Orders   EKG 12-Lead (Completed)   Cardiac Stress Test: Informed Consent Details: Physician/Practitioner Attestation; Transcribe to consent form and obtain patient signature   Cardiopulmonary exercise test      ===================================  HPI:    Luis Guzman is a 77 y.o. male with a PMH notable for CAD-PCI x 2 with Persistent Atypical Angina, HTN, HLD and palpitations who presents today for 84-month follow-up.  He follows up today at the request of Kristian Covey, MD.  NEILSON OEHLERT was last seen on May 23, 2022.  At that time actually he was feeling "okay.  Not really having his anginal symptoms as much.  Just a sense of chest tightness here and there, but not always  exertional in nature.  Occasionally noting some heavy palpitations but not fast.  Notes dyspnea at night but not really exertion.  He did have 1 episode on September 27 where he had dyspnea and palpitations as well as sharp discomfort in the left side in our resolved.  Very sharp chest pains at that time.  Recent Hospitalizations: n/a  Reviewed  CV studies:    The following studies were reviewed today: (if available, images/films reviewed: From Epic Chart or Care Everywhere) 10/10/2022 2D Echo: EF 55 to 60%.  Normal wall motion.  Mild asymmetric basal septal hypertrophy 1 DD.  Normal RV, RVP and RAP.  Normal aortic and mitral valves. 11/22/2022 AAA duplex: No evidence of abdominal aortic aneurysm.  Enlarging aortic measurement was 2.2 cm-had previously been measured at 2.7 mm.  Interval History:   SAHEJ SCHRIEBER returns here today for 14-month follow-up with his wife.  He states that he is having a hard time tolerating the increased dose of rectal Ranexa.  He tried both the 1000 g at night and in the morning and could not tolerate the higher dose.  He also says he is taking his Lasix maybe 2 to 3 days a week but he is having a hard time keeping his weight in the 185 range.  He is not really noticing any PND orthopnea but does have occasional edema.  Just rarely has orthopnea. He also says that his blood pressure is unusually low today.  His most typically is in the 120s/60s to the 130s/70s.  Despite this, he still has a bit dizzy spells but should not usually associate with low blood pressures.  Sometimes they are associated with dyspnea.  Most prominent however is that he has been having still episodes of unusual type of angina type symptoms he said he continues nitroglycerin 8 times since I last saw him and for time since February mostly is because of shortness of breath spells but occasionally there is some chest tightness.  Usually it is with exertion and usually more than usual exertion but sometimes  this is simply walking.  He said 1 time it was when he was out walking but the rest are usually with minimal activity.  He has not had prolonged episodes but there have been times return to take up to 2 or 3 doses of nitroglycerin.  CV Review of Symptoms (Summary): Cardiovascular ROS: positive for - chest pain, dyspnea on exertion, irregular heartbeat, orthopnea, palpitations, and shortness of breath negative for - paroxysmal nocturnal dyspnea, rapid heart rate,  or syncope or near syncope.  TIA or amaurosis fugax, claudication  REVIEWED OF SYSTEMS   Review of Systems  Constitutional:  Negative for malaise/fatigue (Energy level seems to be doing better.) and weight loss.  HENT:  Positive for congestion.   Respiratory:  Positive for shortness of breath (Per HPI).   Cardiovascular:  Negative for leg swelling.  Musculoskeletal:  Positive for joint pain (Hips are doing better.).  Neurological:  Positive for dizziness (Does not seem to be always a to low BP). Negative for focal weakness, loss of consciousness and weakness.  Psychiatric/Behavioral:  Negative for depression and memory loss. The patient is nervous/anxious. The patient does not have insomnia.    I have reviewed and (if needed) personally updated the patient's problem list, medications, allergies, past medical and surgical history, social and family history.   PAST MEDICAL HISTORY   Past Medical History:  Diagnosis Date   AAA (abdominal aortic aneurysm)    Anemia    as infant history of   CAD S/P PTCA & DES PCI - for Progressive Angina 02/26/2019   Cath-PCI 02/26/2019: EF 55-65%. mCx 65% (FFR 0.82 - Med Rx). D2 85% - Wolverine Scoring Balloon PTCA (2.0 mm). Ost rPDA - 90% -Resolute Onyx 2.5 x 15 (2.6 mm). 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 lower back pain, s/p cervical neck surgery"stiffness" remains   Transfusion history    infant "anemia"    PAST SURGICAL HISTORY   Past Surgical History:  Procedure Laterality Date   APPENDECTOMY  ~ 1959   BACK SURGERY     CERVICAL DISC ARTHROPLASTY N/A 07/30/2018   Procedure: Cervical five-six Cervical six-seven Artificial disc replacement;  Surgeon: Barnett Abu, MD;  Location: [facility] OR;  Service: Neurosurgery;  Laterality: N/A;   CERVICAL LAMINECTOMY  1997   C 4 and C 5   CHOLECYSTECTOMY N/A 04/15/2014   Procedure: LAPAROSCOPIC CHOLECYSTECTOMY WITH INTRAOPERATIVE CHOLANGIOGRAM;  Surgeon: Darnell Level, MD;  Location: [facility] ORS;  Service: General;  Laterality: N/A;   COLONOSCOPY     CORONARY BALLOON ANGIOPLASTY N/A 02/26/2019   Procedure: CORONARY BALLOON ANGIOPLASTY;  Surgeon: Marykay Lex, MD;  Location: [facility] INVASIVE CV LAB;  Service: Cardiovascular -> scoring balloon PTCA (Wolverine 2.0 mm) of ostial D2 85% reducing to 20-30%.   CORONARY PRESSURE/FFR STUDY N/A 02/26/2019   Procedure: INTRAVASCULAR PRESSURE WIRE/FFR STUDY;  Surgeon: Marykay Lex, MD;  Location: [facility] INVASIVE CV LAB;  Service: Cardiovascular;; mCx ~65% - DFR 0.92, FR 0.82 - BORDERLINE--> MED Rx   CORONARY PRESSURE/FFR STUDY N/A 11/10/2021   Procedure: INTRAVASCULAR PRESSURE WIRE/FFR STUDY;  Surgeon: Marykay Lex, MD;  Location: [facility] INVASIVE CV LAB;  Service: Cardiovascular;  Laterality: N/A;   CORONARY STENT INTERVENTION N/A 02/26/2019   Procedure: CORONARY STENT INTERVENTION;  Surgeon: Marykay Lex, MD;  Location: [facility] INVASIVE CV LAB;; PCI ostial RPDA (initial attempt of PTCA only led to small local tear/dissection covered with stent) Resolute Onyx 2.5 mm x 15 mm (2.6 mm  CORONARY STENT INTERVENTION N/A 07/05/2019   Procedure: CORONARY STENT INTERVENTION;  Surgeon: Marykay Lex, MD;  Location: [facility] INVASIVE CV LAB;  Service: Cardiovascular;; ostial D1 95% (restenosis of PTCA site) -> DES PCI RESOLUTE ONYX 2.25 mm x 50 mm (2.6 mm) positioned to avoid the true ostium.    ESOPHAGOGASTRODUODENOSCOPY N/A 03/13/2014   Procedure: ESOPHAGOGASTRODUODENOSCOPY (EGD);  Surgeon: Graylin Shiver, MD;  Location: [facility] ENDOSCOPY;  Service: Endoscopy;  Laterality: N/A;   LEFT HEART CATH AND CORONARY ANGIOGRAPHY N/A 02/26/2019   Procedure: LEFT HEART CATH AND CORONARY ANGIOGRAPHY;  Surgeon: Marykay Lex, MD;  Location: [facility] INVASIVE CV LAB;  EF 55-65%. mCx 65% (FFR 0.82 - Med Rx). D2 85% - Wolverine Scoring Balloon PTCA (2.0 mm). Ost rPDA - 90% -Resolute Onyx 2.5 x 15 (2.6 mm).    LEFT HEART CATH AND CORONARY ANGIOGRAPHY N/A 07/05/2019   Procedure: LEFT HEART CATH AND CORONARY ANGIOGRAPHY;  Surgeon: Marykay Lex, MD;  Location: [facility] INVASIVE CV LAB;; ostial D1 95% (restenosis of PTCA site) -> DES PCI.  PDA stent widely patent.  Mid CX stable 65% lesion.  Proximal RCA 40%.  Mid RCA 45%.  Normal EDP.   LEFT HEART CATH AND CORONARY ANGIOGRAPHY N/A 11/10/2021   Procedure: LEFT HEART CATH AND CORONARY ANGIOGRAPHY;  Surgeon: Marykay Lex, MD;  Location: [facility] INVASIVE CV LAB;  Service: CV:: : Stable mid LCx with 5% (RFR 0.95).  Stable proximal RCA 40% and mid RCA 55%.  D1 and RPDA stents widely patent (   NM MYOVIEW LTD  10/20/2021   Horizontal ST depressions in V4 and V5, frequent exercise-induced PVCs.  Inferior patient defect improves with stress-consistent with artifact.  Positive EKG, negative perfusion imaging.  Read as moderate risk study because of exertional ectopy.  Normal EF.   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   SHOULDER ARTHROSCOPY Bilateral    SHOULDER ARTHROSCOPY W/ ROTATOR CUFF REPAIR Bilateral 2004-2009   left 2004, right 2009   TOTAL HIP  ARTHROPLASTY Left 03/04/2020   Procedure: TOTAL HIP ARTHROPLASTY ANTERIOR APPROACH;  Surgeon: Ollen Gross, MD;  Location: [facility] ORS;  Service: Orthopedics;  Laterality: Left;    TOTAL HIP ARTHROPLASTY Right 09/30/2020   Procedure: TOTAL HIP ARTHROPLASTY ANTERIOR APPROACH;  Surgeon: Ollen Gross, MD;  Location: [facility] ORS;  Service: Orthopedics;  Laterality: Right;    TRANSTHORACIC ECHOCARDIOGRAM  02/2020; 10/01/2020   a) EF 55 to 60%.  GR 1 DD.  No R WMA.  Trivial MR.  Otherwise normal.; b) EF 55 to 60%.  Normal LV function and no wall motion abnormality.  Normal RV.  Normal valves..   TRANSTHORACIC ECHOCARDIOGRAM  10/18/2021   Normal LV function with a EF of 65%.  No RWMA.  GR 1 DD.  Normal valves.  Mild concentric LVH.  Mild MR and mild-moderate AI.   VASECTOMY     WISDOM TOOTH EXTRACTION     Left Heart Cath with Coronary Angiography 11/10/2021: Stable mid LCx with 5% (RFR 0.95).  Stable proximal RCA 40% and mid RCA 55%.  D1 and RPDA stents widely patent. => Lasix dose increased to 3 days a week, Ranexa 500 mg twice daily and Imdur increased to 60 mg. Post-cath complication with failed minx closure device.  Patient developed a hematoma (no surgery) several hours after device placement.  Patient spent the next 2 days in the hospital.     MEDICATIONS/ALLERGIES   Current Meds  Medication Sig   acetaminophen (TYLENOL)  500 MG tablet Take 1,000 mg by mouth every 6 (six) hours as needed for moderate pain.   Alirocumab (PRALUENT) 150 MG/ML SOAJ Inject 150 mg into the skin every 14 (fourteen) days.   aspirin EC 81 MG tablet Take 1 tablet (81 mg total) by mouth daily. Swallow whole. ( start after stopping clopidogrel 75 mg)   bimatoprost (LUMIGAN) 0.03 % ophthalmic solution Place 1 drop into both eyes at bedtime.   Brinzolamide-Brimonidine (SIMBRINZA) 1-0.2 % SUSP Place 1 drop into both eyes in the morning and at bedtime.   ibuprofen (ADVIL) 200 MG tablet Take 400 mg by mouth every 6  (six) hours as needed for moderate pain.   traZODone (DESYREL) 50 MG tablet Take 50 mg by mouth at bedtime.   [Refilled] amLODipine (NORVASC) 5 MG tablet Take 1 tablet (5 mg total) by mouth daily.   [Refilled] furosemide (LASIX) 20 MG tablet Take  20 to 40 mg daily as needed for shortness of breath, wt gain, swelling.   [Refilled] irbesartan (AVAPRO) 150 MG tablet Take 1 tablet (150 mg total) by mouth daily.   [Refilled] isosorbide mononitrate (IMDUR) 60 MG 24 hr tablet Take 1 tablet (60 mg total) by mouth daily.   [Refilled] metoprolol succinate (TOPROL-XL) 25 MG 24 hr tablet TAKE 1/2 TABLET BY MOUTH EVERY DAY   [Refilled] nitroGLYCERIN (NITROSTAT) 0.4 MG SL tablet Place 1 tablet (0.4 mg total) under the tongue every 5 (five) minutes as needed for chest pain.   [Refilled] potassium chloride SA (KLOR-CON M) 20 MEQ tablet Take 10 mEq (0.5 tablets) on days that you take lasix.   [Refilled] ranolazine (RANEXA) 500 MG 12 hr tablet Take 1 tablet (500 mg total) by mouth 2 (two) times daily. Take 1000 mg in the morning and 500 mg in the evening -> did not tolerate the increased dose in the morning.    Allergies  Allergen Reactions   Statins     Muscle pain   Penicillins Itching    Has patient had a PCN reaction causing immediate rash, facial/tongue/throat swelling, SOB or lightheadedness with hypotension: no Has patient had a PCN reaction causing severe rash involving mucus membranes or skin necrosis: unkn Has patient had a PCN reaction that required hospitalization: no Has patient had a PCN reaction occurring within the last 10 years: no If all of the above answers are "NO", then may proceed with Cephalosporin use.  Tolerated Cephalosporin Date: 10/01/20.    SOCIAL HISTORY/FAMILY HISTORY   Reviewed in Epic:  Pertinent findings:  Social History   Tobacco Use   Smoking status: Former    Packs/day: 1.00    Years: 5.00    Additional pack years: 0.00    Total pack years: 5.00    Types:  Cigarettes    Quit date: 10/17/1968    Years since quitting: 54.1   Smokeless tobacco: Never  Vaping Use   Vaping Use: Never used  Substance Use Topics   Alcohol use: Not Currently    Comment: "quit drinking ~ 10/1968   Drug use: No   Social History   Social History Narrative   Not on file    OBJCTIVE -PE, EKG, labs   Wt Readings from Last 3 Encounters:  11/28/22 191 lb 9.6 oz (86.9 kg)  05/23/22 190 lb 12.8 oz (86.5 kg)  05/20/22 189 lb 8 oz (86 kg)    Physical Exam: BP (!) 90/54   Pulse 69   Ht 6\' 1"  (1.854 m)   Wt 191 lb  9.6 oz (86.9 kg)   SpO2 96%   BMI 25.28 kg/m  Physical Exam Vitals reviewed.  Constitutional:      General: He is not in acute distress.    Appearance: Normal appearance. He is normal weight. He is not ill-appearing or toxic-appearing.  HENT:     Head: Normocephalic and atraumatic.  Neck:     Vascular: No carotid bruit or JVD.  Cardiovascular:     Rate and Rhythm: Normal rate and regular rhythm. No extrasystoles are present.    Chest Wall: PMI is not displaced.     Pulses: Normal pulses.     Heart sounds: Normal heart sounds, S1 normal and S2 normal. No murmur heard.    No friction rub. No gallop.  Pulmonary:     Effort: Pulmonary effort is normal. No respiratory distress.     Breath sounds: Normal breath sounds. No wheezing, rhonchi or rales.  Musculoskeletal:        General: No swelling. Normal range of motion.     Cervical back: Normal range of motion and neck supple.  Skin:    General: Skin is warm and dry.  Neurological:     General: No focal deficit present.     Mental Status: He is alert and oriented to person, place, and time. Mental status is at baseline.     Gait: Gait abnormal.  Psychiatric:        Mood and Affect: Mood normal.        Behavior: Behavior normal.        Thought Content: Thought content normal.        Judgment: Judgment normal.    Adult ECG Report  Rate: 69;  Rhythm: normal sinus rhythm and normal axis,  intervals and durations. ;   Narrative Interpretation: Stable  Recent Labs: Reviewed Lab Results  Component Value Date   CHOL 127 11/14/2022   HDL 44 11/14/2022   LDLCALC 55 11/14/2022   LDLDIRECT 129.0 08/19/2016   TRIG 169 (H) 11/14/2022   CHOLHDL 2.9 11/14/2022   Lab Results  Component Value Date   CREATININE 0.91 11/14/2022   BUN 13 11/14/2022   NA 143 11/14/2022   K 3.3 (L) 11/14/2022   CL 104 11/14/2022   CO2 24 11/14/2022      Latest Ref Rng & Units 05/20/2022    9:13 AM 11/24/2021   10:01 AM 11/12/2021    7:21 AM  CBC  WBC 4.0 - 10.5 K/uL 8.2  9.7  9.3   Hemoglobin 13.0 - 17.0 g/dL 16.1  09.6  04.5   Hematocrit 39.0 - 52.0 % 44.9  46.6  38.8   Platelets 150.0 - 400.0 K/uL 211.0  269  174     Lab Results  Component Value Date   HGBA1C 5.9 05/20/2022   Lab Results  Component Value Date   TSH 0.61 05/06/2019    ================================================== I spent a total of 38 minutes with the patient spent in direct patient consultation.  Additional time spent with chart review  / charting (studies, outside notes, etc): 18 min Total Time: 56 min  Current medicines are reviewed at length with the patient today.  (+/- concerns) N/A  Notice: This dictation was prepared with Dragon dictation along with smart phrase technology. Any transcriptional errors that result from this process are unintentional and may not be corrected upon review.  Studies Ordered:   Orders Placed This Encounter  Procedures   Cardiopulmonary exercise test   Cardiac Stress Test: Informed  Consent Details: Physician/Practitioner Attestation; Transcribe to consent form and obtain patient signature   EKG 12-Lead   Meds ordered this encounter  Medications   DISCONTD: isosorbide mononitrate (IMDUR) 60 MG 24 hr tablet    Sig: Take 1 tablet (60 mg total) by mouth every morning.    Dispense:  90 tablet    Refill:  3   amLODipine (NORVASC) 5 MG tablet    Sig: Take 1 tablet (5 mg  total) by mouth daily.    Dispense:  90 tablet    Refill:  3   furosemide (LASIX) 20 MG tablet    Sig: Take  20 to 60 mg daily as needed for shortness of breath, wt gain, swelling.    Dispense:  90 tablet    Refill:  3    Take daily as needed for shortness of breath, wt gain, swelling.   irbesartan (AVAPRO) 150 MG tablet    Sig: Take 1 tablet (150 mg total) by mouth daily.    Dispense:  90 tablet    Refill:  3   isosorbide mononitrate (IMDUR) 60 MG 24 hr tablet    Sig: Take 1 tablet (60 mg total) by mouth every morning.    Dispense:  90 tablet    Refill:  3   metoprolol succinate (TOPROL-XL) 25 MG 24 hr tablet    Sig: Take 0.5 tablets (12.5 mg total) by mouth daily.    Dispense:  45 tablet    Refill:  3   nitroGLYCERIN (NITROSTAT) 0.4 MG SL tablet    Sig: Place 1 tablet (0.4 mg total) under the tongue every 5 (five) minutes as needed for chest pain.    Dispense:  25 tablet    Refill:  6   potassium chloride SA (KLOR-CON M) 10 MEQ tablet    Sig: Take 10 mEq ( 1 capsule)  on days that you take lasix.    Dispense:  90 tablet    Refill:  3   ranolazine (RANEXA) 500 MG 12 hr tablet    Sig: Take 1000 mg in the morning and 500 mg in the evening    Dispense:  270 tablet    Refill:  3    Discontinue previous dose    Patient Instructions / Medication Changes & Studies & Tests Ordered   Patient Instructions  Medication Instructions:   Start taking Isosorbide mononitrate ( Imdur) in the morning   *If you need a refill on your cardiac medications before your next appointment, please call your pharmacy*   Lab Work: Not needed .   Testing/Procedures:  Will be schedule at  Heart and Vascular at [facility] [address]  Your physician has recommended that you have a cardiopulmonary stress test (CPX). CPX testing is a non-invasive measurement of heart and lung function. It replaces a traditional treadmill stress test. This type of test provides a tremendous amount of information  that relates not only to your present condition but also for future outcomes. This test combines measurements of you ventilation, respiratory gas exchange in the lungs, electrocardiogram (EKG), blood pressure and physical response before, during, and following an exercise protocol.   Follow-Up: At [facility], you and your health needs are our priority.  As part of our continuing mission to provide you with exceptional heart care, we have created designated Provider Care Teams.  These Care Teams include your primary Cardiologist (physician) and Advanced Practice Providers (APPs -  Physician Assistants and Nurse Practitioners) who all work together to  provide you with the care you need, when you need it.     Your next appointment:   6 month(s)  The format for your next appointment:   In Person  Provider:   Bryan Lemma, MD     Other Instructions   CPX TEST PATIENT INSTRUCTIONS    You are scheduled for a Cardiopulmonary Exercise (CPX) Test at [facility] on:  ____/____/____ at ____:____ am/pm.  Expect to be in the lab for 2 hours. Please plan to arrive 30 MINUTES PRIOR to your appointment. You may be asked to reschedule your test if you arrive 20 minutes or more after your scheduled appointment time.  Main Campus address: [address] [place], [place] [address]  *You may arrive to Main Entrance A or Entrance C (Free valet parking is available at both).  - Main Entrance A (from [facility]): proceed to admitting for check-in  - Entrance C (from [facility]): proceed to [employer] parking or under hospital deck parking using this code _______________  Check-in: [facility] waiting room   General instructions for the day of the test (Please follow all instructions from your physician):   Refrain from ingesting a heavy meal, alcohol, or caffeine or using tobacco products within 2 hours of the test (DO NOT FAST for more than 8 hours). You may have all  other non-alcoholic, non-caffeinated beverages a light snack (crackers, a piece of fruit, carrot sticks, toast, bagel, etc.) up to your appointment time.   Avoid significant exertion or exercise within 24 hours of your test.   Be prepared to exercise and sweat. Your clothing should permit freedom of movement and include walking or running shoes. Women bring loose-fitting, short-sleeved blouse.   This evaluation may be fatiguing, and you may wish to have someone accompany you to the assessment to drive you home afterward.   Bring a list of your medications with you, including dosage and frequency you take the medication (i.e., once per day, twice per day, etc).   Take all medications as prescribed, unless noted below or instructed to do so by your physician.  o Please do not take the following medications prior to your CPX:N/A  Brief description of the test:  A brief lung function test will be performed. This will involve you taking deep breaths and blowing out hard and fast through your mouth. During these, a clip will be on your nose and you will be breathing through a breathing device.  For the exercise portion of the test you will be walking on a treadmill, or riding a stationary bike, to your maximal effort or until symptoms such as chest pain, shortness of breath, leg pain or dizziness limit your exercise. You will be breathing in and out of a breathing device through your mouth (a clip will be on your nose again). Your heart rate, ECG, blood pressure, oxygen saturation, breathing rate and depth, amount of oxygen you consume and amount of carbon dioxide you produce will be measured and monitored throughout the exercise test.  If you need to cancel or reschedule your appointment please call: [phone]  If you have any further questions please call your physician      Marykay Lex, MD, MS Bryan Lemma, M.D., M.S. Interventional Cardiologist  [facility]  Pager  # [phone] Phone # [phone] [address] [place], [place] [address]   Thank you for choosing [facility] [place]!!

## 2022-11-28 NOTE — Patient Instructions (Addendum)
Medication Instructions:   Start taking Isosorbide mononitrate ( Imdur) in the morning   *If you need a refill on your cardiac medications before your next appointment, please call your pharmacy*   Lab Work: Not needed .   Testing/Procedures:  Will be schedule at  Heart and Vascular at [facility]  Your physician has recommended that you have a cardiopulmonary stress test (CPX). CPX testing is a non-invasive measurement of heart and lung function. It replaces a traditional treadmill stress test. This type of test provides a tremendous amount of information that relates not only to your present condition but also for future outcomes. This test combines measurements of you ventilation, respiratory gas exchange in the lungs, electrocardiogram (EKG), blood pressure and physical response before, during, and following an exercise protocol.   Follow-Up: At [facility], you and your health needs are our priority.  As part of our continuing mission to provide you with exceptional heart care, we have created designated Provider Care Teams.  These Care Teams include your primary Cardiologist (physician) and Advanced Practice Providers (APPs -  Physician Assistants and Nurse Practitioners) who all work together to provide you with the care you need, when you need it.     Your next appointment:   6 month(s)  The format for your next appointment:   In Person  Provider:   Bryan Lemma, MD     Other Instructions   CPX TEST PATIENT INSTRUCTIONS    You are scheduled for a Cardiopulmonary Exercise (CPX) Test at [facility] on:  ____/____/____ at ____:____ am/pm.  Expect to be in the lab for 2 hours. Please plan to arrive 30 MINUTES PRIOR to your appointment. You may be asked to reschedule your test if you arrive 20 minutes or more after your scheduled appointment time.  Main Campus address: [address] [place], [place] [address]  *You may arrive to Main Entrance A  or Entrance C (Free valet parking is available at both).  - Main Entrance A (from [facility]): proceed to admitting for check-in  - Entrance C (from [facility]): proceed to [employer] parking or under hospital deck parking using this code _______________  Check-in: [facility] waiting room   General instructions for the day of the test (Please follow all instructions from your physician):   Refrain from ingesting a heavy meal, alcohol, or caffeine or using tobacco products within 2 hours of the test (DO NOT FAST for more than 8 hours). You may have all other non-alcoholic, non-caffeinated beverages a light snack (crackers, a piece of fruit, carrot sticks, toast, bagel, etc.) up to your appointment time.   Avoid significant exertion or exercise within 24 hours of your test.   Be prepared to exercise and sweat. Your clothing should permit freedom of movement and include walking or running shoes. Women bring loose-fitting, short-sleeved blouse.   This evaluation may be fatiguing, and you may wish to have someone accompany you to the assessment to drive you home afterward.   Bring a list of your medications with you, including dosage and frequency you take the medication (i.e., once per day, twice per day, etc).   Take all medications as prescribed, unless noted below or instructed to do so by your physician.  o Please do not take the following medications prior to your CPX:N/A  Brief description of the test:  A brief lung function test will be performed. This will involve you taking deep breaths and blowing  out hard and fast through your mouth. During these, a clip will be on your nose and you will be breathing through a breathing device.  For the exercise portion of the test you will be walking on a treadmill, or riding a stationary bike, to your maximal effort or until symptoms such as chest pain, shortness of breath, leg pain or dizziness limit your exercise.  You will be breathing in and out of a breathing device through your mouth (a clip will be on your nose again). Your heart rate, ECG, blood pressure, oxygen saturation, breathing rate and depth, amount of oxygen you consume and amount of carbon dioxide you produce will be measured and monitored throughout the exercise test.  If you need to cancel or reschedule your appointment please call: [phone]  If you have any further questions please call your physician

## 2022-11-29 ENCOUNTER — Telehealth: Payer: Self-pay | Admitting: Cardiology

## 2022-11-29 NOTE — Telephone Encounter (Signed)
Office called to see if notes from office visit on 11/28/22 can be emailed over to [email or web address]

## 2022-11-29 NOTE — Telephone Encounter (Signed)
[facility] for patient.  She states she can see the nurses note but not the Office note signed by physician on their portal. She also states the OV note may not be uploaded yet  She ask if it can be uploaded to the [facility] portal where she sees the nursing note.  As not familiar with this, advised I would send it to the nurse to review. If able to upload to their portal please do.  Otherwise I have already faxed it to [phone], though this takes a while for her to get it from their faxes.

## 2022-11-30 NOTE — Telephone Encounter (Signed)
Left message on secure voicemail - will send office note once it is available.  Any question may call back

## 2022-12-04 ENCOUNTER — Encounter: Payer: Self-pay | Admitting: Cardiology

## 2022-12-04 NOTE — Assessment & Plan Note (Addendum)
He continues to have these episodes where he is using nitroglycerin several doses at a time.  Did not really get much benefit/note to tolerate the higher dose of Ranexa. Episodes are usually during the day.  Since the episode seem to be nitro responsive it would suggest that maybe there is a spasm component.  8 we have done a cardiac catheterization in the past year showing no significant lesions with patent stents.  At this point, I am not sure if the symptoms are truly related angina or related to pulmonary issues.  Plan: Evaluate with CPX Shared Decision Making/Informed Consent The risks [chest pain, shortness of breath, cardiac arrhythmias, dizziness, blood pressure fluctuations, myocardial infarction, stroke/transient ischemic attack, and life-threatening complications (estimated to be 1 in 10,000)], benefits (risk stratification, diagnosing coronary artery disease, treatment guidance) and alternatives of an exercise tolerance test were discussed in detail with [name] and he agrees to proceed. Change Imdur from nighttime to morning since his symptoms are usually during the day.-If he does use nitroglycerin 1 day he should increase Imdur dose still at 120 mg for couple days. Continue Ranexa 500 mg twice daily He is on Toprol 12.5 mg daily, heart rate 69.  Should be okay, but need to evaluate chronotropic competence.  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 Continue current dose of amlodipine although blood pressure being low is concerning.  Recommend that he takes amlodipine and irbesartan at several times a day. Continue ARB for afterload reduction, but will need to watch his blood pressures closely. If CPX is not helpful, make me consider Stress PET

## 2022-12-04 NOTE — Assessment & Plan Note (Signed)
Was very limited Relestat.  Doing well on Praluent.

## 2022-12-04 NOTE — Assessment & Plan Note (Signed)
Now on aspirin monotherapy for two-vessel PCI.  Had significant bruising on Plavix.  Okay to hold aspirin for surgical procedures 5 to 7 days preop

## 2022-12-04 NOTE — Assessment & Plan Note (Signed)
Labile blood pressure goes up and down.  We reduce his amlodipine dose, recommend that we split the amlodipine and irbesartan dosing.  Plan: Continue amlodipine.  Monitor for blood pressures if the pressure is low elevated times a day he will either not take the amlodipine or the irbesartan that day. He continue Toprol at 12.5 mg daily. Continue Imdur 60 mg

## 2022-12-04 NOTE — Assessment & Plan Note (Signed)
We backed off on a beta-blocker to see if there is any potential chronotropic incompetence related symptoms.  Has not really changed.  Will see what his exercise tolerance is CPX.

## 2022-12-04 NOTE — Assessment & Plan Note (Signed)
This did seem patent last year in March by catheterization.  Still having chest discomfort which is plus or minus exertional.  He is on pretty stable regimen.  Will evaluate with CPX.   For now continue current dose of Toprol and assess chronotropic competence with CPX. On irbesartan and amlodipine-recommend taking it at different times of the day.  Monitor BP. Switch dosing of Imdur to a.m. Continue Ranexa 500 mg twice daily. Continue Praluent with statin intolerance/myalgias and memory loss. Continue aspirin

## 2022-12-04 NOTE — Assessment & Plan Note (Signed)
AAA Doppler look normal.  No evidence of dilation.  It looks like the previously recorded number was probably not accurate.

## 2022-12-04 NOTE — Assessment & Plan Note (Signed)
Intolerant of statin.  Now on Praluent.  Labs seem to be pretty well-controlled with most recent LDL of 55.

## 2022-12-07 ENCOUNTER — Encounter: Payer: Self-pay | Admitting: Cardiology

## 2022-12-07 DIAGNOSIS — I10 Essential (primary) hypertension: Secondary | ICD-10-CM

## 2022-12-07 MED ORDER — FUROSEMIDE 20 MG PO TABS
ORAL_TABLET | ORAL | 3 refills | Status: DC
Start: 2022-12-07 — End: 2022-12-30

## 2022-12-07 NOTE — Telephone Encounter (Signed)
Left message -  to inform Luis Guzman  the completed office note from 11/28/22 has been faxed as requested. Any question may call back .

## 2022-12-13 ENCOUNTER — Telehealth: Payer: Medicare Other

## 2022-12-14 ENCOUNTER — Ambulatory Visit (HOSPITAL_COMMUNITY): Payer: Medicare Other | Attending: Cardiology

## 2022-12-14 DIAGNOSIS — E119 Type 2 diabetes mellitus without complications: Secondary | ICD-10-CM | POA: Insufficient documentation

## 2022-12-14 DIAGNOSIS — I2089 Other forms of angina pectoris: Secondary | ICD-10-CM

## 2022-12-14 DIAGNOSIS — E785 Hyperlipidemia, unspecified: Secondary | ICD-10-CM | POA: Insufficient documentation

## 2022-12-14 DIAGNOSIS — I10 Essential (primary) hypertension: Secondary | ICD-10-CM | POA: Diagnosis not present

## 2022-12-14 DIAGNOSIS — D649 Anemia, unspecified: Secondary | ICD-10-CM | POA: Insufficient documentation

## 2022-12-14 DIAGNOSIS — R0600 Dyspnea, unspecified: Secondary | ICD-10-CM | POA: Diagnosis not present

## 2022-12-14 DIAGNOSIS — I714 Abdominal aortic aneurysm, without rupture, unspecified: Secondary | ICD-10-CM | POA: Diagnosis not present

## 2022-12-14 DIAGNOSIS — I25118 Atherosclerotic heart disease of native coronary artery with other forms of angina pectoris: Secondary | ICD-10-CM | POA: Insufficient documentation

## 2022-12-14 DIAGNOSIS — K219 Gastro-esophageal reflux disease without esophagitis: Secondary | ICD-10-CM | POA: Diagnosis not present

## 2022-12-14 DIAGNOSIS — R0609 Other forms of dyspnea: Secondary | ICD-10-CM

## 2022-12-14 DIAGNOSIS — I209 Angina pectoris, unspecified: Secondary | ICD-10-CM | POA: Diagnosis not present

## 2022-12-20 ENCOUNTER — Telehealth: Payer: Self-pay | Admitting: Cardiology

## 2022-12-20 NOTE — Telephone Encounter (Signed)
Pt would like a callback regarding results and f/u per provider. Please advise

## 2022-12-20 NOTE — Telephone Encounter (Signed)
Patient ask regarding pulmonary results. Physician wants to review via an appointment.  Will send to scheduling

## 2022-12-24 NOTE — Progress Notes (Unsigned)
Cardiology Clinic Note   Date: 12/26/2022 ID: Hewlett, Barrueta May 02, 1946, MRN [id number]  Primary Cardiologist:  Bryan Lemma, MD  Patient Profile    Luis Guzman is a 77 y.o. male who presents to the clinic today for follow up of testing.   Past medical history significant for: CAD. LHC 02/26/2019 (angina): Mid CX 65% not physiologically significant per FFR.  D1 85%.  RPDA 90%.  PCI with scoring balloon angioplasty to D1.  PCI with scoring balloon angioplasty and DES to RPDA. LHC 07/05/2019 (angina): Patent PDA stent.  Stable mid LCx lesion.  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  PCI with DES to D1. LHC 11/10/2021 (angina): Mid Cx 55%-RFR 0.95.  Proximal RCA 40%.  Mid RCA 55%.  D1 stent widely patent.  RPDA stent widely patent.  Imdur was increased and Ranexa added. Echo 10/10/2022: EF 55 to 60%.  Mild LVH of basal-septal segment.  Grade I DD.  Normal RV function.  No significant valvular abnormality. CPX 12/14/2022: Exercise testing with gas exchange demonstrates a normal functional capacity when compared to matched sedentary norms. There is no clear cardiopulmonary limitation. However, VE/VCO2 slope is severely elevated and cannot exclude elevated pulmonary pressures during exercise.  AAA. AAA ultrasound 11/22/2022: No evidence of abdominal aortic aneurysm.  Largest aortic measurement 2.2 cm widely patent bilateral common and external iliac arteries without evidence of focal stenosis.  Prior largest measurement 2.7 cm June 2017. Hypertension. Hyperlipidemia. Lipid panel 11/14/2022: LDL 55, HDL 44, TG 169, total 127.   History of Present Illness    Luis Guzman first evaluated by Dr. Herbie Baltimore on 02/14/2019 for chest tightness and shortness of breath at the request of Dr. Caryl Never.  It was decided to forego stress testing based on patient's concerning symptoms.  LHC showed severe three-vessel CAD and he underwent balloon angioplasty to D1 and scoring balloon angioplasty and DES to RPDA.  In  November 2020 patient reported exertional chest pain.  He underwent repeat LHC which showed severe restenosis of ostial to proximal D1 and received DES to D1.  Patient continues to be followed for the above outlined history.  Was last seen in the office by Dr. Herbie Baltimore on 11/28/2022 with continued complaints of chest discomfort +/- exertion.  Patient reported using several doses of as needed NTG with episodes with little benefit.  Question of symptoms being related to angina or pulmonary issues.  He underwent cardiopulmonary testing.  Per Dr. Elissa Hefty interpretation of CPX: "Somewhat difficult to interpret.  Normal functional capacity but there was evidence of chest pain at peak exercise maybe 2 out of 10.  EKG was normal.  There is suggestion of high pressures in the lungs with exercise.  However there is also significant contradictory data.  We can talk about the possibility of doing exercise right heart catheterization which is something that I would probably have Dr. Gala Romney or one of the other heart failure doctors perform.  Probably easier to discuss in a follow-up clinic visit. I think if we were going to consider exercise right heart cath, we may want to also relook at left heart cath consider evaluating for for small vessel (microvascular) disease."  Today, patient is accompanied by his wife. He reports continued episodes of chest fullness/tightness with shortness of breath that occasionally requires NTG for relief. Patient reports 3 significant episodes since his visit with Dr. Herbie Baltimore on 4/15. He describes shortness of breath as feeling "like I can't get enough air in." He does not  do a dedicated exercise program but stays busy with things around his home. He states he uses yard work as his litmus for how he is doing. He performed yard work this past weekend and felt he needed to rest every 20 minutes to catch his breath and wait for chest discomfort to resolve. The week or two prior when it was  warm out he felt his tolerance was decreased. He and his wife are very concerned with his symptoms. Discussed recent cardiopulmonary testing and Dr. Elissa Hefty recommendations. They would like to pursue [facility] if that is the best option.  Case is discussed with Dr. Herbie Baltimore and he feels patient would be best served with an office visit with Dr. Gala Romney to discuss possibility of exercise RHC.     ROS: All other systems reviewed and are otherwise negative except as noted in History of Present Illness.  Studies Reviewed    ECG is not ordered today.         Physical Exam    VS:  BP 110/66   Pulse 70   Ht 6\' 1"  (1.854 m)   Wt 191 lb (86.6 kg)   SpO2 98%   BMI 25.20 kg/m  , BMI Body mass index is 25.2 kg/m.  GEN: Well nourished, well developed, in no acute distress. Neck: No JVD or carotid bruits. Cardiac:  RRR. No murmurs. No rubs or gallops.   Respiratory:  Respirations regular and unlabored. Clear to auscultation without rales, wheezing or rhonchi. GI: Soft, nontender, nondistended. Extremities: Radials/DP/PT 2+ and equal bilaterally. No clubbing or cyanosis. No edema.  Skin: Warm and dry, no rash. Neuro: Strength intact.  Assessment & Plan    CAD/chest discomfort.  S/p PCI with scoring balloon angioplasty to D1 and DES to RPDA July 2020, PCI with DES to D1 November 2020, patent stents and stable disease RCA March 2023.  Patient with continued angina.  CPX was difficult to interpret raising the question of performing R/LHC with advanced heart failure MD.  Patient reports continued episodes of chest fullness/tightness and associated shortness of breath that occasionally requires NTG for relief. Patient does not perform regular exercise but stays busy with activities around his home. He reports needing to rest after about 20 minutes of yard work before resuming activity. He has had 3 significant episodes since seeing Dr. Herbie Baltimore on 4/15. Discussed with Dr. Herbie Baltimore who feels patient  should see Dr. Gala Romney for an office visit to discuss exercise RHC and potentially shooting coronaries at that time to look for microvascular disease. Patient and wife are in agreement with plan.  Continue Praluent, aspirin, amlodipine, metoprolol, isosorbide, Ranexa, as needed SL NTG. AAA.  Ultrasound April 2024 showed no abdominal aortic aneurysm with stable dilatation.  Denies back pain, abdominal pain, lightheadedness, dizziness, presyncope, syncope.  Continue to monitor with ultrasound as clinically indicated. Hypertension. BP today 110/66. Patient denies headaches, dizziness or vision changes. Continue amlodipine, metoprolol, irbesartan, isosorbide. Hyperlipidemia.  LDL April 2024 55, at goal.  Continue Praluent.  Disposition: Refer to Dr. Gala Romney for possible exercise RHC. Keep previously scheduled visit with Dr. Herbie Baltimore in 5 months or return sooner as needed.          Signed, Etta Grandchild. Alyia Lacerte, DNP, NP-C

## 2022-12-26 ENCOUNTER — Encounter: Payer: Self-pay | Admitting: Student

## 2022-12-26 ENCOUNTER — Ambulatory Visit: Payer: Medicare Other | Attending: Student | Admitting: Student

## 2022-12-26 VITALS — BP 110/66 | HR 70 | Ht 73.0 in | Wt 191.0 lb

## 2022-12-26 DIAGNOSIS — I7143 Infrarenal abdominal aortic aneurysm, without rupture: Secondary | ICD-10-CM

## 2022-12-26 DIAGNOSIS — I2510 Atherosclerotic heart disease of native coronary artery without angina pectoris: Secondary | ICD-10-CM

## 2022-12-26 DIAGNOSIS — Z9861 Coronary angioplasty status: Secondary | ICD-10-CM

## 2022-12-26 DIAGNOSIS — R0609 Other forms of dyspnea: Secondary | ICD-10-CM | POA: Diagnosis not present

## 2022-12-26 DIAGNOSIS — R0789 Other chest pain: Secondary | ICD-10-CM

## 2022-12-26 DIAGNOSIS — I10 Essential (primary) hypertension: Secondary | ICD-10-CM

## 2022-12-26 DIAGNOSIS — E785 Hyperlipidemia, unspecified: Secondary | ICD-10-CM

## 2022-12-26 NOTE — Patient Instructions (Addendum)
Medication Instructions:  Your physician recommends that you continue on your current medications as directed. Please refer to the Current Medication list given to you today.  *If you need a refill on your cardiac medications before your next appointment, please call your pharmacy*   Lab Work: NONE  If you have labs (blood work) drawn today and your tests are completely normal, you will receive your results only by: MyChart Message (if you have MyChart) OR A paper copy in the mail If you have any lab test that is abnormal or we need to change your treatment, we will call you to review the results.   Testing/Procedures: NONE   Follow-Up: At [facility], you and your health needs are our priority.  As part of our continuing mission to provide you with exceptional heart care, we have created designated Provider Care Teams.  These Care Teams include your primary Cardiologist (physician) and Advanced Practice Providers (APPs -  Physician Assistants and Nurse Practitioners) who all work together to provide you with the care you need, when you need it.  We recommend signing up for the patient portal called "MyChart".  Sign up information is provided on this After Visit Summary.  MyChart is used to connect with patients for Virtual Visits (Telemedicine).  Patients are able to view lab/test results, encounter notes, upcoming appointments, etc.  Non-urgent messages can be sent to your provider as well.   To learn more about what you can do with MyChart, go to [email or web address].    Your next appointment:   Keep upcoming appointment with Dr. Herbie Baltimore.   We have gotten you scheduled to see Dr. Gala Romney at our [facility].

## 2022-12-30 ENCOUNTER — Other Ambulatory Visit: Payer: Self-pay | Admitting: Cardiology

## 2022-12-30 DIAGNOSIS — I10 Essential (primary) hypertension: Secondary | ICD-10-CM

## 2023-01-16 NOTE — Progress Notes (Signed)
I called & talked to Luis Guzman - about Exercise RHC with Luis Guzman.  He is agreeable to go ahead with the procedure prior to scheduled appointment with Luis Guzman.   Would like to see if we can post him for Dr. B prior to 6/17 appt.  Shared Decision Making/Informed Consent - Cardiac Catheterization The risks [stroke (1 in 1000), death (1 in 1000), kidney failure [usually temporary] (1 in 500), bleeding (1 in 200), allergic reaction [possibly serious] (1 in 200)], benefits (diagnostic support and management of coronary artery disease) and alternatives of a cardiac catheterization were discussed in detail with Luis Guzman and he is willing to proceed.  Right Heart Catheterization Risk - bleeding, arrhythmia, heart block & very low risk of PA rupture.   Shared Decision Making/Informed Consent The risks [chest pain, shortness of breath, cardiac arrhythmias, dizziness, blood pressure fluctuations, myocardial infarction, stroke/transient ischemic attack, and life-threatening complications (estimated to be 1 in 10,000)], benefits (risk stratification, diagnosing coronary artery disease, treatment guidance) and alternatives of an exercise tolerance test were discussed in detail with Luis Guzman and he agrees to proceed.   Bryan Lemma, MD

## 2023-01-17 ENCOUNTER — Telehealth: Payer: Self-pay | Admitting: *Deleted

## 2023-01-17 ENCOUNTER — Other Ambulatory Visit (HOSPITAL_BASED_OUTPATIENT_CLINIC_OR_DEPARTMENT_OTHER): Payer: Self-pay | Admitting: *Deleted

## 2023-01-17 DIAGNOSIS — R0609 Other forms of dyspnea: Secondary | ICD-10-CM

## 2023-01-17 DIAGNOSIS — Z01812 Encounter for preprocedural laboratory examination: Secondary | ICD-10-CM

## 2023-01-17 NOTE — Progress Notes (Signed)
I have sent message to [facility], Dr Bensimhon's office for date Dr Gala Romney would be available to do this.

## 2023-01-17 NOTE — Progress Notes (Signed)
RHC with exercise 01/25/23 @ 10 AM with Dr Gala Romney

## 2023-01-17 NOTE — Telephone Encounter (Signed)
Right Heart Cath with exercise scheduled with Dr Gala Romney at [facility] for: Wednesday January 25, 2023 10 AM Arrival time [facility] Main Entrance A at: 8 AM  Nothing to eat after midnight prior to procedure, clear liquids until 5 AM day of procedure.  Medication instructions: -Hold:  Lasix/KCl-AM of procedure -Other usual morning medications can be taken with sips of water.  Confirmed patient has responsible adult to drive home post procedure and be with patient first 24 hours after arriving home.  Plan to go home the same day, you will only stay overnight if medically necessary.  Pt will plan to get pre-procedure BMP/CBC 01/18/23 [facility] location.

## 2023-01-19 LAB — BASIC METABOLIC PANEL
BUN/Creatinine Ratio: 12 (ref 10–24)
BUN: 11 mg/dL (ref 8–27)
CO2: 21 mmol/L (ref 20–29)
Calcium: 9.5 mg/dL (ref 8.6–10.2)
Chloride: 106 mmol/L (ref 96–106)
Creatinine, Ser: 0.95 mg/dL (ref 0.76–1.27)
Glucose: 117 mg/dL — ABNORMAL HIGH (ref 70–99)
Potassium: 3.8 mmol/L (ref 3.5–5.2)
Sodium: 144 mmol/L (ref 134–144)
eGFR: 83 mL/min/{1.73_m2} (ref >59)

## 2023-01-19 LAB — CBC
Hematocrit: 47 % (ref 37.5–51.0)
Hemoglobin: 15.3 g/dL (ref 13.0–17.7)
MCH: 30.8 pg (ref 26.6–33.0)
MCHC: 32.6 g/dL (ref 31.5–35.7)
MCV: 95 fL (ref 79–97)
Platelets: 235 10*3/uL (ref 150–450)
RBC: 4.97 x10E6/uL (ref 4.14–5.80)
RDW: 12.3 % (ref 11.6–15.4)
WBC: 7.9 10*3/uL (ref 3.4–10.8)

## 2023-01-24 ENCOUNTER — Telehealth: Payer: Self-pay | Admitting: *Deleted

## 2023-01-24 NOTE — Telephone Encounter (Signed)
Right Heart Cath with exercise scheduled at [facility] for: Wednesday January 25, 2023 10 AM Arrival time [facility] Main Entrance A at: 8 AM  Nothing to eat after midnight prior to procedure, clear liquids until 5 AM day of procedure.  Medication instructions: -Hold:  Lasix/KCl-AM of procedure -Other usual morning medications can be taken with sips of water.  Confirmed patient has responsible adult to drive home post procedure and be with patient first 24 hours after arriving home.  Plan to go home the same day, you will only stay overnight if medically necessary.  Reviewed procedure instructions with patient/wear comfortable clothes.

## 2023-01-26 ENCOUNTER — Other Ambulatory Visit: Payer: Self-pay

## 2023-01-26 ENCOUNTER — Encounter (HOSPITAL_COMMUNITY)
Admission: RE | Disposition: A | Discharge status: Home / Self Care | Payer: Self-pay | Source: Home / Self Care | Attending: Internal Medicine

## 2023-01-26 ENCOUNTER — Ambulatory Visit (HOSPITAL_COMMUNITY)
Admission: RE | Admit: 2023-01-26 | Discharge: 2023-01-26 | Disposition: A | Discharge status: Home / Self Care | Payer: No Typology Code available for payment source | Attending: Internal Medicine | Admitting: Internal Medicine

## 2023-01-26 DIAGNOSIS — R0609 Other forms of dyspnea: Secondary | ICD-10-CM | POA: Insufficient documentation

## 2023-01-26 DIAGNOSIS — Z87891 Personal history of nicotine dependence: Secondary | ICD-10-CM | POA: Insufficient documentation

## 2023-01-26 DIAGNOSIS — I10 Essential (primary) hypertension: Secondary | ICD-10-CM | POA: Insufficient documentation

## 2023-01-26 DIAGNOSIS — R079 Chest pain, unspecified: Secondary | ICD-10-CM | POA: Diagnosis not present

## 2023-01-26 DIAGNOSIS — K219 Gastro-esophageal reflux disease without esophagitis: Secondary | ICD-10-CM | POA: Diagnosis not present

## 2023-01-26 DIAGNOSIS — Z955 Presence of coronary angioplasty implant and graft: Secondary | ICD-10-CM | POA: Insufficient documentation

## 2023-01-26 DIAGNOSIS — I2510 Atherosclerotic heart disease of native coronary artery without angina pectoris: Secondary | ICD-10-CM | POA: Insufficient documentation

## 2023-01-26 DIAGNOSIS — E119 Type 2 diabetes mellitus without complications: Secondary | ICD-10-CM | POA: Insufficient documentation

## 2023-01-26 HISTORY — PX: RIGHT HEART CATH: CATH118263

## 2023-01-26 LAB — POCT I-STAT EG7
Acid-base deficit: 1 mmol/L (ref 0.0–2.0)
Acid-base deficit: 3 mmol/L — ABNORMAL HIGH (ref 0.0–2.0)
Bicarbonate: 22.1 mmol/L (ref 20.0–28.0)
Bicarbonate: 23.9 mmol/L (ref 20.0–28.0)
Calcium, Ion: 1.09 mmol/L — ABNORMAL LOW (ref 1.15–1.40)
Calcium, Ion: 1.21 mmol/L (ref 1.15–1.40)
HCT: 40 % (ref 39.0–52.0)
HCT: 42 % (ref 39.0–52.0)
Hemoglobin: 13.6 g/dL (ref 13.0–17.0)
Hemoglobin: 14.3 g/dL (ref 13.0–17.0)
O2 Saturation: 71 %
O2 Saturation: 72 %
Potassium: 3.8 mmol/L (ref 3.5–5.1)
Potassium: 4.2 mmol/L (ref 3.5–5.1)
Sodium: 139 mmol/L (ref 135–145)
Sodium: 142 mmol/L (ref 135–145)
TCO2: 23 mmol/L (ref 22–32)
TCO2: 25 mmol/L (ref 22–32)
pCO2, Ven: 37.7 mmHg — ABNORMAL LOW (ref 44–60)
pCO2, Ven: 40.6 mmHg — ABNORMAL LOW (ref 44–60)
pH, Ven: 7.376 (ref 7.25–7.43)
pH, Ven: 7.377 (ref 7.25–7.43)
pO2, Ven: 38 mmHg (ref 32–45)
pO2, Ven: 38 mmHg (ref 32–45)

## 2023-01-26 SURGERY — RIGHT HEART CATH
Anesthesia: LOCAL

## 2023-01-26 MED ORDER — SODIUM CHLORIDE 0.9 % IV SOLN: 250.0000 mL | INTRAVENOUS | Status: DC | PRN

## 2023-01-26 MED ORDER — ACETAMINOPHEN 325 MG PO TABS: 650.0000 mg | ORAL_TABLET | ORAL | Status: DC | PRN

## 2023-01-26 MED ORDER — HEPARIN (PORCINE) IN NACL 1000-0.9 UT/500ML-% IV SOLN
INTRAVENOUS | Status: DC | PRN
  Administered 2023-01-26: 500 mL

## 2023-01-26 MED ORDER — LIDOCAINE HCL (PF) 1 % IJ SOLN
INTRAMUSCULAR | Status: DC | PRN
  Administered 2023-01-26: 2 mL

## 2023-01-26 MED ORDER — LABETALOL HCL 5 MG/ML IV SOLN: 10.0000 mg | INTRAVENOUS | Status: DC | PRN

## 2023-01-26 MED ORDER — LIDOCAINE HCL (PF) 1 % IJ SOLN
INTRAMUSCULAR | Status: AC
  Filled 2023-01-26: qty 30

## 2023-01-26 MED ORDER — SODIUM CHLORIDE 0.9% FLUSH: 3.0000 mL | INTRAVENOUS | Status: DC | PRN

## 2023-01-26 MED ORDER — SODIUM CHLORIDE 0.9% FLUSH: 3.0000 mL | Freq: Two times a day (BID) | INTRAVENOUS | Status: DC

## 2023-01-26 MED ORDER — ONDANSETRON HCL 4 MG/2ML IJ SOLN: 4.0000 mg | Freq: Four times a day (QID) | INTRAMUSCULAR | Status: DC | PRN

## 2023-01-26 MED ORDER — SODIUM CHLORIDE 0.9 % IV SOLN: INTRAVENOUS | Status: DC

## 2023-01-26 MED ORDER — HYDRALAZINE HCL 20 MG/ML IJ SOLN: 10.0000 mg | INTRAMUSCULAR | Status: DC | PRN

## 2023-01-26 SURGICAL SUPPLY — 5 items
CATH SWAN GANZ 7F STRAIGHT (CATHETERS) IMPLANT
GLIDESHEATH SLENDER 7FR .021G (SHEATH) IMPLANT
PACK CARDIAC CATHETERIZATION (CUSTOM PROCEDURE TRAY) ×2 IMPLANT
TRANSDUCER W/STOPCOCK (MISCELLANEOUS) ×2 IMPLANT
TUBING ART PRESS 72 MALE/FEM (TUBING) IMPLANT

## 2023-01-26 NOTE — Discharge Instructions (Signed)

## 2023-01-26 NOTE — H&P (Signed)
ADVANCED HF H&P NOTE  Referring Physician: Kristian Covey, MD Primary Care: Kristian Covey, MD Primary Cardiologist: Bryan Lemma, MD  HPI:  77 y/o male with CAD s/p PCI. DM2, HTN, HL, GERD referred by Dr. Herbie Baltimore for exercise RHC due to persistent exertional CP and dyspnea of unclear etiology.   CPX testing  12/14/22 showed:   Normal functional capacity when compared to match subjects. There was mild exertional CP (2/10) at peak. The exercise ECG was normal. All parameters appear normal except for the VE/VCO2 slope which is markedly elevated and suggestive of significantly elevated pulmonary pressures with exercise. However the end-exercise PETCO2 is quite low and can falsely elevated the VE/VCO2 slope. If symptoms persis, would recommend repeat exercise testing with measurement of invasive hemodynamics (exercise right heart catheterization).    Review of Systems: [y] = yes, [ ]  = no   General: Weight gain [ ] ; Weight loss [ ] ; Anorexia [ ] ; Fatigue [ ] ; Fever [ ] ; Chills [ ] ; Weakness [ ]   Cardiac: Chest pain/pressure [ y]; Resting SOB [ ] ; Exertional SOB [name] ]; Orthopnea [ ] ; Pedal Edema [ ] ; Palpitations [ ] ; Syncope [ ] ; Presyncope [ ] ; Paroxysmal nocturnal dyspnea[ ]   Pulmonary: Cough [ ] ; Wheezing[ ] ; Hemoptysis[ ] ; Sputum [ ] ; Snoring [ ]   GI: Vomiting[ ] ; Dysphagia[ ] ; Melena[ ] ; Hematochezia [ ] ; Heartburn[ ] ; Abdominal pain [ ] ; Constipation [ ] ; Diarrhea [ ] ; BRBPR [ ]   GU: Hematuria[ ] ; Dysuria [ ] ; Nocturia[ ]   Vascular: Pain in legs with walking [ ] ; Pain in feet with lying flat [ ] ; Non-healing sores [ ] ; Stroke [ ] ; TIA [ ] ; Slurred speech [ ] ;  Neuro: Headaches[ ] ; Vertigo[ ] ; Seizures[ ] ; Paresthesias[ ] ;Blurred vision [ ] ; Diplopia [ ] ; Vision changes [ ]   Ortho/Skin: Arthritis [name] ]; Joint pain [ ] ; Muscle pain [ ] ; Joint swelling [ ] ; Back Pain [ ] ; Rash [ ]   Psych: Depression[ ] ; Anxiety[ ]   Heme: Bleeding problems [ ] ; Clotting disorders [ ] ; Anemia [ ]    Endocrine: Diabetes [name] ]; Thyroid dysfunction[ ]    Past Medical History:  Diagnosis Date   Anemia    as infant history of   CAD S/P PTCA & DES PCI - for Progressive Angina 02/26/2019   Cath-PCI 02/26/2019: EF 55-65%. mCx 65% (FFR 0.82 - Med Rx). D2 85% - Wolverine Scoring Balloon PTCA (2.0 mm). Ost rPDA - 90% -Resolute Onyx 2.5 x 15 (2.6 mm). 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 lower back pain, s/p cervical neck surgery"stiffness" remains   Transfusion history    infant "anemia"    Current Facility-Administered Medications  Medication Dose Route Frequency Provider Last Rate Last Admin   0.9 %  sodium chloride infusion  250 mL Intravenous PRN Marykay Lex, MD       0.9 %  sodium chloride infusion   Intravenous Continuous Marykay Lex, MD       sodium chloride flush (NS) 0.9 %  injection 3 mL  3 mL Intravenous Q12H Marykay Lex, MD       sodium chloride flush (NS) 0.9 % injection 3 mL  3 mL Intravenous PRN Marykay Lex, MD        Allergies  Allergen Reactions   Statins     Muscle pain   Penicillins Itching    Has patient had a PCN reaction causing immediate rash, facial/tongue/throat swelling, SOB or lightheadedness with hypotension: no Has patient had a PCN reaction causing severe rash involving mucus membranes or skin necrosis: unkn Has patient had a PCN reaction that required hospitalization: no Has patient had a PCN reaction occurring within the last 10 years: no If all of the above answers are "NO", then may proceed with Cephalosporin  use.  Tolerated Cephalosporin Date: 10/01/20.      Social History   Socioeconomic History   Marital status: Married    Spouse name: Not on file   Number of children: Not on file   Years of education: Not on file   Highest education level: Not on file  Occupational History   Not on file  Tobacco Use   Smoking status: Former    Packs/day: 1.00    Years: 5.00    Additional pack years: 0.00    Total pack years: 5.00    Types: Cigarettes    Quit date: 10/17/1968    Years since quitting: 54.3   Smokeless tobacco: Never  Vaping Use   Vaping Use: Never used  Substance and Sexual Activity   Alcohol use: Not Currently    Comment: "quit drinking ~ 10/1968   Drug use: No   Sexual activity: Not on file  Other Topics Concern   Not on file  Social History Narrative   Not on file   Social Determinants of Health   Financial Resource Strain: Low Risk  (08/22/2022)   Overall Financial Resource Strain (CARDIA)    Difficulty of Paying Living Expenses: Not very hard  Food Insecurity: No Food Insecurity (08/22/2022)   Hunger Vital Sign    Worried About Running Out of Food in the Last Year: Never true    Ran Out of Food in the Last Year: Never true  Transportation Needs: No Transportation Needs (08/22/2022)   PRAPARE - Administrator, Civil Service (Medical): No    Lack of Transportation (Non-Medical): No  Physical Activity: Inactive (05/16/2022)   Exercise Vital Sign    Days of Exercise per Week: 0 days    Minutes of Exercise per Session: 0 min  Stress: No Stress Concern Present (08/22/2022)   [employer] of Occupational Health - Occupational Stress Questionnaire    Feeling of Stress : Not at all  Social Connections: Moderately Integrated (08/22/2022)   Social Connection and Isolation Panel [NHANES]    Frequency of Communication with Friends and Family: More than three times a week    Frequency of Social Gatherings with Friends and Family: More than three times a week     Attends Religious Services: Never    Database administrator or Organizations: Yes    Attends Banker Meetings: Never    Marital Status: Married  Catering manager Violence: Not At Risk (05/07/2021)   Humiliation, Afraid, Rape, and Kick questionnaire    Fear of Current or Ex-Partner: No    Emotionally Abused: No    Physically Abused: No    Sexually Abused: No      Family History  Problem Relation  Age of Onset   Hypertension Mother    Hypertension Father    Arthritis Sister        rheumatoid   Stroke Sister 78    Vitals:   01/26/23 0552  BP: 130/66  Pulse: 66  Resp: 18  Temp: 98.4 F (36.9 C)  TempSrc: Temporal  SpO2: 97%  Weight: 83.9 kg  Height: 6\' 1"  (1.854 m)    PHYSICAL EXAM: General:  Well appearing. No respiratory difficulty HEENT: normal Neck: supple. no JVD. Carotids 2+ bilat; no bruits. No lymphadenopathy or thryomegaly appreciated. Cor: PMI nondisplaced. Regular rate & rhythm. No rubs, gallops or murmurs. Lungs: clear Abdomen: soft, nontender, nondistended. No hepatosplenomegaly. No bruits or masses. Good bowel sounds. Extremities: no cyanosis, clubbing, rash, edema Neuro: alert & oriented x 3, cranial nerves grossly intact. moves all 4 extremities w/o difficulty. Affect pleasant.   ASSESSMENT & PLAN:  1. Exertional CP and dyspnea - symptoms not well explained by results of last coronary angio. CPX testing suggests possible increased pulmonary pressures with exercise.  - will plan exercise RHC to further evaluate.  - procedure d/w patient.   Arvilla Meres, MD  7:52 AM

## 2023-01-27 ENCOUNTER — Encounter (HOSPITAL_COMMUNITY): Payer: Self-pay | Admitting: Internal Medicine

## 2023-01-30 ENCOUNTER — Encounter (HOSPITAL_COMMUNITY): Payer: Self-pay | Admitting: Internal Medicine

## 2023-01-30 ENCOUNTER — Ambulatory Visit (HOSPITAL_COMMUNITY)
Admission: RE | Admit: 2023-01-30 | Discharge: 2023-01-30 | Disposition: A | Discharge status: Home / Self Care | Payer: No Typology Code available for payment source | Source: Ambulatory Visit | Attending: Internal Medicine | Admitting: Internal Medicine

## 2023-01-30 VITALS — BP 120/70 | HR 66 | Wt 189.4 lb

## 2023-01-30 DIAGNOSIS — Z7182 Exercise counseling: Secondary | ICD-10-CM | POA: Insufficient documentation

## 2023-01-30 DIAGNOSIS — I25118 Atherosclerotic heart disease of native coronary artery with other forms of angina pectoris: Secondary | ICD-10-CM | POA: Diagnosis not present

## 2023-01-30 DIAGNOSIS — I10 Essential (primary) hypertension: Secondary | ICD-10-CM | POA: Insufficient documentation

## 2023-01-30 DIAGNOSIS — E119 Type 2 diabetes mellitus without complications: Secondary | ICD-10-CM | POA: Diagnosis not present

## 2023-01-30 DIAGNOSIS — I5189 Other ill-defined heart diseases: Secondary | ICD-10-CM

## 2023-01-30 DIAGNOSIS — R0602 Shortness of breath: Secondary | ICD-10-CM | POA: Insufficient documentation

## 2023-01-30 DIAGNOSIS — I2510 Atherosclerotic heart disease of native coronary artery without angina pectoris: Secondary | ICD-10-CM | POA: Diagnosis not present

## 2023-01-30 DIAGNOSIS — R0609 Other forms of dyspnea: Secondary | ICD-10-CM

## 2023-01-30 DIAGNOSIS — I2089 Other forms of angina pectoris: Secondary | ICD-10-CM

## 2023-01-30 DIAGNOSIS — Z955 Presence of coronary angioplasty implant and graft: Secondary | ICD-10-CM | POA: Insufficient documentation

## 2023-01-30 NOTE — Patient Instructions (Signed)
There has been no changes to your medications.  Please try and walk at least 3 times a week for at least 30-45 minutes.  Your physician recommends that you schedule a follow-up appointment in: 4 months (October) ** please call the office in August to arrange your follow up appointment. **  If you have any questions or concerns before your next appointment please send [place] a message through [place] or call our office at [phone].    TO LEAVE A MESSAGE FOR THE NURSE SELECT OPTION 2, PLEASE LEAVE A MESSAGE INCLUDING: YOUR NAME DATE OF BIRTH CALL BACK NUMBER REASON FOR CALL**this is important as we prioritize the call backs  YOU WILL RECEIVE A CALL BACK THE SAME DAY AS LONG AS YOU CALL BEFORE 4:00 PM  At the [facility], you and your health needs are our priority. As part of our continuing mission to provide you with exceptional heart care, we have created designated Provider Care Teams. These Care Teams include your primary Cardiologist (physician) and Advanced Practice Providers (APPs- Physician Assistants and Nurse Practitioners) who all work together to provide you with the care you need, when you need it.   You may see any of the following providers on your designated Care Team at your next follow up: Dr Arvilla Meres Dr Marca Ancona Dr. Marcos Eke, NP Robbie Lis, [place] [facility] [place], [place] Brynda Peon, NP Karle Plumber, PharmD   Please be sure to bring in all your medications bottles to every appointment.    Thank you for choosing [facility]

## 2023-01-30 NOTE — Progress Notes (Signed)
ADVANCED HF CLINIC NOTE  Referring Physician: Kristian Covey, MD Primary Care: Kristian Covey, MD Primary Cardiologist: Bryan Lemma, MD  HPI:  Luis Guzman is a 77 y/o male with CAD s/p PCI. DM2, HTN, HL, GERD referred by Dr. Herbie Baltimore for further evaluation of persistent exertional CP and dyspnea of unclear etiology.   Cath 3/23 LAD ok D1 stent patent LCx 55%m RCA 40%prox 55%m RPDA stent patent  Echo 2/24 EF 55-60% G1DD RV ok  CPX testing  12/14/22 showed:  FVC 4.12 (92%)      FEV1 3.34 (101%)        FEV1/FVC 81 (108%)        MVV 151 (115%)   Resting HR: 83 Standing HR: 85 Peak HR: 127   (88% age predicted max HR)  BP rest: 130/74 BP Standing: 108/74 BP peak: 178/64  Peak VO2: 20.9 (84% predicted peak VO2)  VE/VCO2 slope:  43  OUES: 1.68  Peak RER: 1.17  Ventilatory Threshold: 16.5 (66% predicted peak VO2)  Peak RR 36  Peak Ventilation:  91.7  VE/MVV:  61%  PETCO2 at peak:  29  O2pulse:  14   (93% predicted O2pulse)   Exercise RHC showed normal pulmonary pressures at rest. PA pressures increased with exercise with no significant change in PCWP    Here today with his wife to reviewed cath data.  Says he feels ok. Able to push mow grass but gets. Does not exercise regularly. Says SOB started about 3 years ago. Was in [place] in January and walked a lot. Had to stop a lot but did fine. Has CP about 3x/week. Can come with rest or exertion   Rest Ao = 136/75 RA = 2 RV = 31/5 PA = 32/16 (18) PCW = 11 Fick cardiac output/index = 4.9/2.4 Thermo CO/CI = 6.2/3.0  PVR = 1.2 WU FA sat = 99% PA sat = 71%, 72%   Peak exercise Ao = 164/83 PA = 61/15 (36) PCW = 17 Thermo CO/CI = 13.8/6.6 PVR = 1.4 WU     Review of Systems: [y] = yes, [ ]  = no   General: Weight gain [ ] ; Weight loss [ ] ; Anorexia [ ] ; Fatigue [ ] ; Fever [ ] ; Chills [ ] ; Weakness [ ]   Cardiac: Chest pain/pressure [ y]; Resting SOB [ ] ; Exertional SOB [name] ]; Orthopnea [ ] ; Pedal Edema [ ] ;  Palpitations [ ] ; Syncope [ ] ; Presyncope [ ] ; Paroxysmal nocturnal dyspnea[ ]   Pulmonary: Cough [ ] ; Wheezing[ ] ; Hemoptysis[ ] ; Sputum [ ] ; Snoring [ ]   GI: Vomiting[ ] ; Dysphagia[ ] ; Melena[ ] ; Hematochezia [ ] ; Heartburn[ ] ; Abdominal pain [ ] ; Constipation [ ] ; Diarrhea [ ] ; BRBPR [ ]   GU: Hematuria[ ] ; Dysuria [ ] ; Nocturia[ ]   Vascular: Pain in legs with walking [ ] ; Pain in feet with lying flat [ ] ; Non-healing sores [ ] ; Stroke [ ] ; TIA [ ] ; Slurred speech [ ] ;  Neuro: Headaches[ ] ; Vertigo[ ] ; Seizures[ ] ; Paresthesias[ ] ;Blurred vision [ ] ; Diplopia [ ] ; Vision changes [ ]   Ortho/Skin: Arthritis [name] ]; Joint pain [ ] ; Muscle pain [ ] ; Joint swelling [ ] ; Back Pain [ ] ; Rash [ ]   Psych: Depression[ ] ; Anxiety[ ]   Heme: Bleeding problems [ ] ; Clotting disorders [ ] ; Anemia [ ]   Endocrine: Diabetes [name] ]; Thyroid dysfunction[ ]    Past Medical History:  Diagnosis Date   Anemia    as infant history of   CAD S/P PTCA &  DES PCI - for Progressive Angina 02/26/2019   Cath-PCI 02/26/2019: EF 55-65%. mCx 65% (FFR 0.82 - Med Rx). D2 85% - Wolverine Scoring Balloon PTCA (2.0 mm). Ost rPDA - 90% -Resolute Onyx 2.5 x 15 (2.6 mm). 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 lower back pain, s/p cervical neck surgery"stiffness" remains   Transfusion history    infant "anemia"    Current Outpatient Medications  Medication Sig Dispense Refill   acetaminophen (TYLENOL) 500 MG tablet Take 1,000 mg by mouth every 6 (six) hours as needed for moderate pain.     Alirocumab (PRALUENT)  150 MG/ML SOAJ Inject 150 mg into the skin every 14 (fourteen) days. 2 mL 11   amLODipine (NORVASC) 5 MG tablet Take 1 tablet (5 mg total) by mouth daily. 90 tablet 3   aspirin EC 81 MG tablet Take 1 tablet (81 mg total) by mouth daily. Swallow whole. ( start after stopping clopidogrel 75 mg) 90 tablet 3   bimatoprost (LUMIGAN) 0.03 % ophthalmic solution Place 1 drop into both eyes at bedtime.     Brinzolamide-Brimonidine (SIMBRINZA) 1-0.2 % SUSP Place 1 drop into both eyes in the morning and at bedtime.     furosemide (LASIX) 20 MG tablet Take 20 mg by mouth as needed. 1-3 tabs as needed     ibuprofen (ADVIL) 200 MG tablet Take 400 mg by mouth every 6 (six) hours as needed for moderate pain.     irbesartan (AVAPRO) 150 MG tablet Take 1 tablet (150 mg total) by mouth daily. 90 tablet 3   isosorbide mononitrate (IMDUR) 60 MG 24 hr tablet Take 1 tablet (60 mg total) by mouth every morning. 90 tablet 3   metoprolol succinate (TOPROL-XL) 25 MG 24 hr tablet Take 0.5 tablets (12.5 mg total) by mouth daily. 45 tablet 3   nitroGLYCERIN (NITROSTAT) 0.4 MG SL tablet Place 1 tablet (0.4 mg total) under the tongue every 5 (five) minutes as needed for chest pain. 25 tablet 6   potassium chloride SA (KLOR-CON M) 10 MEQ tablet Take 10 mEq ( 1 capsule)  on days that you take lasix. 90 tablet 3   ranolazine (RANEXA) 500 MG 12 hr tablet Take 500 mg by mouth 2 (two) times daily.     traZODone (DESYREL) 50 MG tablet Take 50 mg by mouth at bedtime.     No current facility-administered medications for this encounter.    Allergies  Allergen Reactions   Statins     Muscle pain   Penicillins Itching    Has patient had a PCN reaction causing immediate rash, facial/tongue/throat swelling, SOB or lightheadedness with hypotension: no Has patient had a PCN reaction causing severe rash involving mucus membranes or skin necrosis: unkn Has patient had a PCN reaction that required hospitalization: no Has patient had a PCN  reaction occurring within the last 10 years: no If all of the above answers are "NO", then may proceed with Cephalosporin use.  Tolerated Cephalosporin Date: 10/01/20.  Social History   Socioeconomic History   Marital status: Married    Spouse name: Not on file   Number of children: Not on file   Years of education: Not on file   Highest education level: Not on file  Occupational History   Not on file  Tobacco Use   Smoking status: Former    Packs/day: 1.00    Years: 5.00    Additional pack years: 0.00    Total pack years: 5.00    Types: Cigarettes    Quit date: 10/17/1968    Years since quitting: 54.3   Smokeless tobacco: Never  Vaping Use   Vaping Use: Never used  Substance and Sexual Activity   Alcohol use: Not Currently    Comment: "quit drinking ~ 10/1968   Drug use: No   Sexual activity: Not on file  Other Topics Concern   Not on file  Social History Narrative   Not on file   Social Determinants of Health   Financial Resource Strain: Low Risk  (08/22/2022)   Overall Financial Resource Strain (CARDIA)    Difficulty of Paying Living Expenses: Not very hard  Food Insecurity: No Food Insecurity (08/22/2022)   Hunger Vital Sign    Worried About Running Out of Food in the Last Year: Never true    Ran Out of Food in the Last Year: Never true  Transportation Needs: No Transportation Needs (08/22/2022)   PRAPARE - Administrator, Civil Service (Medical): No    Lack of Transportation (Non-Medical): No  Physical Activity: Inactive (05/16/2022)   Exercise Vital Sign    Days of Exercise per Week: 0 days    Minutes of Exercise per Session: 0 min  Stress: No Stress Concern Present (08/22/2022)   [employer] of Occupational Health - Occupational Stress Questionnaire    Feeling of Stress : Not at all  Social Connections: Moderately Integrated (08/22/2022)   Social Connection and Isolation Panel [NHANES]    Frequency of Communication with Friends and Family:  More than three times a week    Frequency of Social Gatherings with Friends and Family: More than three times a week    Attends Religious Services: Never    Database administrator or Organizations: Yes    Attends Banker Meetings: Never    Marital Status: Married  Catering manager Violence: Not At Risk (05/07/2021)   Humiliation, Afraid, Rape, and Kick questionnaire    Fear of Current or Ex-Partner: No    Emotionally Abused: No    Physically Abused: No    Sexually Abused: No      Family History  Problem Relation Age of Onset   Hypertension Mother    Hypertension Father    Arthritis Sister        rheumatoid   Stroke Sister 87    Vitals:   01/30/23 1431  BP: 120/70  Pulse: 66  SpO2: 97%  Weight: 85.9 kg (189 lb 6.4 oz)    PHYSICAL EXAM: General:  Well appearing. No resp difficulty HEENT: normal Neck: supple. no JVD. Carotids 2+ bilat; no bruits. No lymphadenopathy or thryomegaly appreciated. Cor: PMI nondisplaced. Regular rate & rhythm. No rubs, gallops or murmurs. Lungs: clear Abdomen: soft, nontender, nondistended. No hepatosplenomegaly. No bruits or masses. Good bowel sounds. Extremities: no cyanosis, clubbing, rash, edema Neuro: alert & orientedx3, cranial nerves grossly intact. moves all 4 extremities w/o difficulty. Affect pleasant   ASSESSMENT & PLAN:  1. Exertional CP and dyspnea -  CPX testing shows relatively well preserved functional capacity but high VE/VCO2 slope suggestive of elevated pulmonary pressures with exercise +/- end-exercisr hyperventilation - exercise RHC shows normal hemodynamics at rest but significant elevation in PA pressures with exercise with a normal PCWP. I explained to Aban and his wife that moderate increases in PA pressures during exercise is not an uncommon in elderly subjects and may represent "normal" physiology however this is almost certainly contributing to his exertional symptoms. Unfortunately there is no direct  therapy for this except chronic exercise training - will refer to CR and I also encouraged him to walk regularly with walking intervals - I will see him back in 6 months to reassess  2. CAD - cath as above - seems to have a component of chronic stable angina which may be related to microvascular disease versus elevated pulmonary pressures as above - continue current regimen - refer CR  Total time spent 45 minutes. Over half that time spent discussing above.   Arvilla Meres, MD  10:11 PM    Arvilla Meres, MD  2:55 PM

## 2023-02-07 ENCOUNTER — Telehealth (HOSPITAL_COMMUNITY): Payer: Self-pay | Admitting: Cardiology

## 2023-02-07 DIAGNOSIS — I2089 Other forms of angina pectoris: Secondary | ICD-10-CM

## 2023-02-07 NOTE — Telephone Encounter (Signed)
Pt called to check the status of CR referral Per Dr Gala Romney ok for CR Order placed

## 2023-03-08 ENCOUNTER — Encounter (HOSPITAL_COMMUNITY): Payer: Self-pay

## 2023-03-14 ENCOUNTER — Encounter (HOSPITAL_COMMUNITY): Payer: Self-pay

## 2023-03-14 ENCOUNTER — Encounter (HOSPITAL_COMMUNITY)
Admission: RE | Admit: 2023-03-14 | Discharge: 2023-03-14 | Disposition: A | Discharge status: Home / Self Care | Payer: No Typology Code available for payment source | Source: Ambulatory Visit | Attending: Internal Medicine | Admitting: Internal Medicine

## 2023-03-14 VITALS — BP 110/60 | HR 76 | Ht 72.75 in | Wt 192.0 lb

## 2023-03-14 DIAGNOSIS — I2089 Other forms of angina pectoris: Secondary | ICD-10-CM | POA: Diagnosis not present

## 2023-03-14 NOTE — Progress Notes (Signed)
Cardiac Individual Treatment Plan  Patient Details  Name: Luis Guzman MRN: [id number] Date of Birth: Mar 17, 1946 Referring Provider:   Flowsheet Row INTENSIVE CARDIAC REHAB ORIENT from 03/14/2023 in [facility]  Referring Provider Bensimhon, Bevelyn Buckles, MD       Initial Encounter Date:  Flowsheet Row INTENSIVE CARDIAC REHAB ORIENT from 03/14/2023 in [facility]  Date 03/14/23       Visit Diagnosis: Chronic stable angina  Patient's Home Medications on Admission:  Current Outpatient Medications:    acetaminophen (TYLENOL) 500 MG tablet, Take 1,000 mg by mouth every 6 (six) hours as needed for moderate pain., Disp: , Rfl:    Alirocumab (PRALUENT) 150 MG/ML SOAJ, Inject 150 mg into the skin every 14 (fourteen) days., Disp: 2 mL, Rfl: 11   amLODipine (NORVASC) 5 MG tablet, Take 1 tablet (5 mg total) by mouth daily., Disp: 90 tablet, Rfl: 3   aspirin EC 81 MG tablet, Take 1 tablet (81 mg total) by mouth daily. Swallow whole. ( start after stopping clopidogrel 75 mg), Disp: 90 tablet, Rfl: 3   bimatoprost (LUMIGAN) 0.03 % ophthalmic solution, Place 1 drop into both eyes at bedtime., Disp: , Rfl:    Brinzolamide-Brimonidine (SIMBRINZA) 1-0.2 % SUSP, Place 1 drop into both eyes in the morning and at bedtime., Disp: , Rfl:    furosemide (LASIX) 20 MG tablet, Take 20 mg by mouth as needed. 1-3 tabs as needed, Disp: , Rfl:    ibuprofen (ADVIL) 200 MG tablet, Take 400 mg by mouth every 6 (six) hours as needed for moderate pain., Disp: , Rfl:    irbesartan (AVAPRO) 150 MG tablet, Take 1 tablet (150 mg total) by mouth daily., Disp: 90 tablet, Rfl: 3   isosorbide mononitrate (IMDUR) 60 MG 24 hr tablet, Take 1 tablet (60 mg total) by mouth every morning., Disp: 90 tablet, Rfl: 3   metoprolol succinate (TOPROL-XL) 25 MG 24 hr tablet, Take 0.5 tablets (12.5 mg total) by mouth daily., Disp:  45 tablet, Rfl: 3   nitroGLYCERIN (NITROSTAT) 0.4 MG SL tablet, Place 1 tablet (0.4 mg total) under the tongue every 5 (five) minutes as needed for chest pain., Disp: 25 tablet, Rfl: 6   potassium chloride SA (KLOR-CON M) 10 MEQ tablet, Take 10 mEq ( 1 capsule)  on days that you take lasix., Disp: 90 tablet, Rfl: 3   ranolazine (RANEXA) 500 MG 12 hr tablet, Take 500 mg by mouth 2 (two) times daily., Disp: , Rfl:    traZODone (DESYREL) 50 MG tablet, Take 50 mg by mouth at bedtime., Disp: , Rfl:   Past Medical History: Past Medical History:  Diagnosis Date   Anemia    as infant history of   CAD S/P PTCA & DES PCI - for Progressive Angina 02/26/2019   Cath-PCI 02/26/2019: EF 55-65%. mCx 65% (FFR 0.82 - Med Rx). D2 85% - Wolverine Scoring Balloon PTCA (2.0 mm). Ost rPDA - 90% -Resolute Onyx 2.5 x 15 (2.6 mm). 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 lower back pain, s/p cervical neck surgery"stiffness" remains   Transfusion history    infant "anemia"    Tobacco Use: Social History   Tobacco Use  Smoking Status Former   Current packs/day: 0.00   Average packs/day: 1 pack/day for 5.0 years (5.0 ttl pk-yrs)   Types: Cigarettes   Start date: 10/18/1963   Quit date: 10/17/1968   Years since quitting: 54.4  Smokeless Tobacco Never    Labs: Review Flowsheet  More data exists      Latest Ref Rng & Units 05/18/2021 07/02/2021 05/20/2022 11/14/2022 01/26/2023  Labs for ITP Cardiac and Pulmonary Rehab  Cholestrol 100 - 199 mg/dL 914  782  956  213  -  LDL (calc) 0 - 99 mg/dL 46   63  73  55  -  HDL-C >39 mg/dL 08.65  46  78.46  44  -  Trlycerides 0 - 149 mg/dL 96.2  64  952.8  413  -  Hemoglobin A1c 4.6 - 6.5 % 6.0  - 5.9  - -  Bicarbonate 20.0 - 28.0 mmol/L 20.0 - 28.0 mmol/L - - - - 22.1  23.9   TCO2 22 - 32 mmol/L 22 - 32 mmol/L - - - - 23  25   Acid-base deficit 0.0 - 2.0 mmol/L 0.0 - 2.0 mmol/L - - - - 3.0  1.0   O2 Saturation % % - - - - 72  71     Details       Multiple values from one day are sorted in reverse-chronological order         Capillary Blood Glucose: Lab Results  Component Value Date   GLUCAP 95 03/04/2020     Exercise Target Goals: Exercise Program Goal: Individual exercise prescription set using results from initial 6 min walk test and THRR while considering  patient's activity barriers and safety.   Exercise Prescription Goal: Initial exercise prescription builds to 30-45 minutes a day of aerobic activity, 2-3 days per week.  Home exercise guidelines will be given to patient during program as part of exercise prescription that the participant will acknowledge.  Activity Barriers & Risk Stratification:  Activity Barriers & Cardiac Risk Stratification - 03/14/23 0836       Activity Barriers & Cardiac Risk Stratification   Activity Barriers Left Hip Replacement;Right Hip Replacement;Neck/Spine Problems;Other (comment);Shortness of Breath;Chest Pain/Angina    Comments Two artificial discs in neck. Left leg fracture in 2010, screws, pins, rod from knee to ankle.    Cardiac Risk Stratification High             6 Minute Walk:  6 Minute Walk     Row Name 03/14/23 0937         6 Minute Walk   Phase Initial     Distance 1511 feet     Walk Time 6 minutes     # of Rest Breaks 0     MPH 2.86     METS 2.94     RPE 11     Perceived Dyspnea  1     VO2 Peak 10.28     Symptoms Yes (comment)     Comments Mild shortness of breath     Resting HR 76 bpm     Resting BP 110/60     Resting Oxygen Saturation  97 %      Exercise  Oxygen Saturation  during 6 min walk 95 %     Max Ex. HR 91 bpm     Max Ex. BP 110/70     2 Minute Post BP 102/62              Oxygen Initial Assessment:   Oxygen Re-Evaluation:   Oxygen Discharge (Final Oxygen Re-Evaluation):   Initial Exercise Prescription:  Initial Exercise Prescription - 03/14/23 0900       Date of Initial Exercise RX and Referring Provider   Date 03/14/23    Referring Provider [name], MD    Expected Discharge Date 06/07/23      Recumbant Bike   Level 2    Watts 26    Minutes 15    METs 2.9      Recumbant Elliptical   Level 1    Minutes 15    METs 2.9      Prescription Details   Frequency (times per week) 3    Duration Progress to 30 minutes of continuous aerobic without signs/symptoms of physical distress      Intensity   THRR 40-80% of Max Heartrate 58-115    Ratings of Perceived Exertion 11-13    Perceived Dyspnea 0-4      Progression   Progression Continue to progress workloads to maintain intensity without signs/symptoms of physical distress.      Resistance Training   Training Prescription Yes    Weight 4 lbs    Reps 10-15             Perform Capillary Blood Glucose checks as needed.  Exercise Prescription Changes:   Exercise Comments:   Exercise Goals and Review:   Exercise Goals     Row Name 03/14/23 0756             Exercise Goals   Increase Physical Activity Yes       Intervention Provide advice, education, support and counseling about physical activity/exercise needs.;Develop an individualized exercise prescription for aerobic and resistive training based on initial evaluation findings, risk stratification, comorbidities and participant's personal goals.       Expected Outcomes Short Term: Attend rehab on a regular basis to increase amount of physical activity.;Long Term: Exercising regularly at least 3-5 days a week.;Long Term: Add in home exercise to make exercise part of  routine and to increase amount of physical activity.       Increase Strength and Stamina Yes       Intervention Provide advice, education, support and counseling about physical activity/exercise needs.;Develop an individualized exercise prescription for aerobic and resistive training based on initial evaluation findings, risk stratification, comorbidities and participant's personal goals.       Expected Outcomes Short Term: Increase workloads from initial exercise prescription for resistance, speed, and METs.;Short Term: Perform resistance training exercises routinely during rehab and add in resistance training at home;Long Term: Improve cardiorespiratory fitness, muscular endurance and strength as measured by increased METs and functional capacity ( )       Able to understand and use rate of perceived exertion (RPE) scale Yes       Intervention Provide education and explanation on how to use RPE scale       Expected Outcomes Short Term: Able to use RPE daily in rehab to express subjective intensity level;Long Term:  Able to use RPE to guide intensity level when exercising independently       Knowledge and understanding of Target Heart Rate Range (THRR) Yes  Intervention Provide education and explanation of THRR including how the numbers were predicted and where they are located for reference       Expected Outcomes Short Term: Able to state/look up THRR;Long Term: Able to use THRR to govern intensity when exercising independently;Short Term: Able to use daily as guideline for intensity in rehab       Able to check pulse independently Yes       Intervention Provide education and demonstration on how to check pulse in carotid and radial arteries.;Review the importance of being able to check your own pulse for safety during independent exercise       Expected Outcomes Short Term: Able to explain why pulse checking is important during independent exercise;Long Term: Able to check pulse independently  and accurately       Understanding of Exercise Prescription Yes       Intervention Provide education, explanation, and written materials on patient's individual exercise prescription       Expected Outcomes Short Term: Able to explain program exercise prescription;Long Term: Able to explain home exercise prescription to exercise independently                Exercise Goals Re-Evaluation :   Discharge Exercise Prescription (Final Exercise Prescription Changes):   Nutrition:  Target Goals: Understanding of nutrition guidelines, daily intake of sodium 1500mg , cholesterol 200mg , calories 30% from fat and 7% or less from saturated fats, daily to have 5 or more servings of fruits and vegetables.  Biometrics:  Pre Biometrics - 03/14/23 0744       Pre Biometrics   Waist Circumference 39.75 inches    Hip Circumference 39.25 inches    Waist to Hip Ratio 1.01 %    Triceps Skinfold 8 mm    % Body Fat 24 %    Grip Strength 36 kg    Flexibility 11.25 in    Single Leg Stand 30 seconds              Nutrition Therapy Plan and Nutrition Goals:   Nutrition Assessments:  MEDIFICTS Score Key: ?70 Need to make dietary changes  40-70 Heart Healthy Diet ? 40 Therapeutic Level Cholesterol Diet    Picture Your Plate Scores: <09 Unhealthy dietary pattern with much room for improvement. 41-50 Dietary pattern unlikely to meet recommendations for good health and room for improvement. 51-60 More healthful dietary pattern, with some room for improvement.  >60 Healthy dietary pattern, although there may be some specific behaviors that could be improved.    Nutrition Goals Re-Evaluation:   Nutrition Goals Re-Evaluation:   Nutrition Goals Discharge (Final Nutrition Goals Re-Evaluation):   Psychosocial: Target Goals: Acknowledge presence or absence of significant depression and/or stress, maximize coping skills, provide positive support system. Participant is able to verbalize types  and ability to use techniques and skills needed for reducing stress and depression.  Initial Review & Psychosocial Screening:  Initial Psych Review & Screening - 03/14/23 0832       Initial Review   Current issues with None Identified      Family Dynamics   Good Support System? Yes   Najah has his wife for support     Barriers   Psychosocial barriers to participate in program There are no identifiable barriers or psychosocial needs.      Screening Interventions   Interventions Encouraged to exercise             Quality of Life Scores:  Quality of Life - 03/14/23 0940  Quality of Life   Select Quality of Life      Quality of Life Scores   Health/Function Pre 22.77 %    Socioeconomic Pre 26.5 %    Psych/Spiritual Pre 30 %    Family Pre 28.8 %    GLOBAL Pre 26.1 %            Scores of 19 and below usually indicate a poorer quality of life in these areas.  A difference of  2-3 points is a clinically meaningful difference.  A difference of 2-3 points in the total score of the Quality of Life Index has been associated with significant improvement in overall quality of life, self-image, physical symptoms, and general health in studies assessing change in quality of life.  PHQ-9: Review Flowsheet  More data exists      03/14/2023 08/22/2022 05/20/2022 05/16/2022 05/07/2021  Depression screen PHQ 2/9  Decreased Interest 0 0 0 0 0  Down, Depressed, Hopeless 0 0 0 0 0  PHQ - 2 Score 0 0 0 0 0  Altered sleeping 1 - - - -  Tired, decreased energy 1 - - - -  Change in appetite 0 - - - -  Feeling bad or failure about yourself  0 - - - -  Trouble concentrating 0 - - - -  Moving slowly or fidgety/restless 0 - - - -  Suicidal thoughts 0 - - - -  PHQ-9 Score 2 - - - -  Difficult doing work/chores Not difficult at all - - - -    Details           Interpretation of Total Score  Total Score Depression Severity:  1-4 = Minimal depression, 5-9 = Mild depression, 10-14 =  Moderate depression, 15-19 = Moderately severe depression, 20-27 = Severe depression   Psychosocial Evaluation and Intervention:   Psychosocial Re-Evaluation:   Psychosocial Discharge (Final Psychosocial Re-Evaluation):   Vocational Rehabilitation: Provide vocational rehab assistance to qualifying candidates.   Vocational Rehab Evaluation & Intervention:  Vocational Rehab - 03/14/23 0834       Initial Vocational Rehab Evaluation & Intervention   Assessment shows need for Vocational Rehabilitation No   Gregorie is retired and does not need vocational rehab at this time            Education: Education Goals: Education classes will be provided on a weekly basis, covering required topics. Participant will state understanding/return demonstration of topics presented.     Core Videos: Exercise    Move It!  Clinical staff conducted group or individual video education with verbal and written material and guidebook.  Patient learns the recommended Pritikin exercise program. Exercise with the goal of living a long, healthy life. Some of the health benefits of exercise include controlled diabetes, healthier blood pressure levels, improved cholesterol levels, improved heart and lung capacity, improved sleep, and better body composition. Everyone should speak with their doctor before starting or changing an exercise routine.  Biomechanical Limitations Clinical staff conducted group or individual video education with verbal and written material and guidebook.  Patient learns how biomechanical limitations can impact exercise and how we can mitigate and possibly overcome limitations to have an impactful and balanced exercise routine.  Body Composition Clinical staff conducted group or individual video education with verbal and written material and guidebook.  Patient learns that body composition (ratio of muscle mass to fat mass) is a key component to assessing overall fitness, rather than  body weight alone. Increased fat  mass, especially visceral belly fat, can put [place] at increased risk for metabolic syndrome, type 2 diabetes, heart disease, and even death. It is recommended to combine diet and exercise (cardiovascular and resistance training) to improve your body composition. Seek guidance from your physician and exercise physiologist before implementing an exercise routine.  Exercise Action Plan Clinical staff conducted group or individual video education with verbal and written material and guidebook.  Patient learns the recommended strategies to achieve and enjoy long-term exercise adherence, including variety, self-motivation, self-efficacy, and positive decision making. Benefits of exercise include fitness, good health, weight management, more energy, better sleep, less stress, and overall well-being.  Medical   Heart Disease Risk Reduction Clinical staff conducted group or individual video education with verbal and written material and guidebook.  Patient learns our heart is our most vital organ as it circulates oxygen, nutrients, white blood cells, and hormones throughout the entire body, and carries waste away. Data supports a plant-based eating plan like the Pritikin Program for its effectiveness in slowing progression of and reversing heart disease. The video provides a number of recommendations to address heart disease.   Metabolic Syndrome and Belly Fat  Clinical staff conducted group or individual video education with verbal and written material and guidebook.  Patient learns what metabolic syndrome is, how it leads to heart disease, and how one can reverse it and keep it from coming back. You have metabolic syndrome if you have 3 of the following 5 criteria: abdominal obesity, high blood pressure, high triglycerides, low HDL cholesterol, and high blood sugar.  Hypertension and Heart Disease Clinical staff conducted group or individual video education with verbal and  written material and guidebook.  Patient learns that high blood pressure, or hypertension, is very common in the [place]. Hypertension is largely due to excessive salt intake, but other important risk factors include being overweight, physical inactivity, drinking too much alcohol, smoking, and not eating enough potassium from fruits and vegetables. High blood pressure is a leading risk factor for heart attack, stroke, congestive heart failure, dementia, kidney failure, and premature death. Long-term effects of excessive salt intake include stiffening of the arteries and thickening of heart muscle and organ damage. Recommendations include ways to reduce hypertension and the risk of heart disease.  Diseases of Our Time - Focusing on Diabetes Clinical staff conducted group or individual video education with verbal and written material and guidebook.  Patient learns why the best way to stop diseases of our time is prevention, through food and other lifestyle changes. Medicine (such as prescription pills and surgeries) is often only a Band-Aid on the problem, not a long-term solution. Most common diseases of our time include obesity, type 2 diabetes, hypertension, heart disease, and cancer. The Pritikin Program is recommended and has been proven to help reduce, reverse, and/or prevent the damaging effects of metabolic syndrome.  Nutrition   Overview of the Pritikin Eating Plan  Clinical staff conducted group or individual video education with verbal and written material and guidebook.  Patient learns about the Pritikin Eating Plan for disease risk reduction. The Pritikin Eating Plan emphasizes a wide variety of unrefined, minimally-processed carbohydrates, like fruits, vegetables, whole grains, and legumes. Go, Caution, and Stop food choices are explained. Plant-based and lean animal proteins are emphasized. Rationale provided for low sodium intake for blood pressure control, low added sugars for blood  sugar stabilization, and low added fats and oils for coronary artery disease risk reduction and weight management.  Calorie Density  Clinical  staff conducted group or individual video education with verbal and written material and guidebook.  Patient learns about calorie density and how it impacts the Pritikin Eating Plan. Knowing the characteristics of the food you choose will help you decide whether those foods will lead to weight gain or weight loss, and whether you want to consume more or less of them. Weight loss is usually a side effect of the Pritikin Eating Plan because of its focus on low calorie-dense foods.  Label Reading  Clinical staff conducted group or individual video education with verbal and written material and guidebook.  Patient learns about the Pritikin recommended label reading guidelines and corresponding recommendations regarding calorie density, added sugars, sodium content, and whole grains.  Dining Out - Part 1  Clinical staff conducted group or individual video education with verbal and written material and guidebook.  Patient learns that restaurant meals can be sabotaging because they can be so high in calories, fat, sodium, and/or sugar. Patient learns recommended strategies on how to positively address this and avoid unhealthy pitfalls.  Facts on Fats  Clinical staff conducted group or individual video education with verbal and written material and guidebook.  Patient learns that lifestyle modifications can be just as effective, if not more so, as many medications for lowering your risk of heart disease. A Pritikin lifestyle can help to reduce your risk of inflammation and atherosclerosis (cholesterol build-up, or plaque, in the artery walls). Lifestyle interventions such as dietary choices and physical activity address the cause of atherosclerosis. A review of the types of fats and their impact on blood cholesterol levels, along with dietary recommendations to reduce  fat intake is also included.  Nutrition Action Plan  Clinical staff conducted group or individual video education with verbal and written material and guidebook.  Patient learns how to incorporate Pritikin recommendations into their lifestyle. Recommendations include planning and keeping personal health goals in mind as an important part of their success.  Healthy Mind-Set    Healthy Minds, Bodies, Hearts  Clinical staff conducted group or individual video education with verbal and written material and guidebook.  Patient learns how to identify when they are stressed. Video will discuss the impact of that stress, as well as the many benefits of stress management. Patient will also be introduced to stress management techniques. The way we think, act, and feel has an impact on our hearts.  How Our Thoughts Can Heal Our Hearts  Clinical staff conducted group or individual video education with verbal and written material and guidebook.  Patient learns that negative thoughts can cause depression and anxiety. This can result in negative lifestyle behavior and serious health problems. Cognitive behavioral therapy is an effective method to help control our thoughts in order to change and improve our emotional outlook.  Additional Videos:  Exercise    Improving Performance  Clinical staff conducted group or individual video education with verbal and written material and guidebook.  Patient learns to use a non-linear approach by alternating intensity levels and lengths of time spent exercising to help burn more calories and lose more body fat. Cardiovascular exercise helps improve heart health, metabolism, hormonal balance, blood sugar control, and recovery from fatigue. Resistance training improves strength, endurance, balance, coordination, reaction time, metabolism, and muscle mass. Flexibility exercise improves circulation, posture, and balance. Seek guidance from your physician and exercise  physiologist before implementing an exercise routine and learn your capabilities and proper form for all exercise.  Introduction to Yoga  Clinical staff conducted group  or individual video education with verbal and written material and guidebook.  Patient learns about yoga, a discipline of the coming together of mind, breath, and body. The benefits of yoga include improved flexibility, improved range of motion, better posture and core strength, increased lung function, weight loss, and positive self-image. Yoga's heart health benefits include lowered blood pressure, healthier heart rate, decreased cholesterol and triglyceride levels, improved immune function, and reduced stress. Seek guidance from your physician and exercise physiologist before implementing an exercise routine and learn your capabilities and proper form for all exercise.  Medical   Aging: Enhancing Your Quality of Life  Clinical staff conducted group or individual video education with verbal and written material and guidebook.  Patient learns key strategies and recommendations to stay in good physical health and enhance quality of life, such as prevention strategies, having an advocate, securing a Health Care Proxy and Power of Attorney, and keeping a list of medications and system for tracking them. It also discusses how to avoid risk for bone loss.  Biology of Weight Control  Clinical staff conducted group or individual video education with verbal and written material and guidebook.  Patient learns that weight gain occurs because we consume more calories than we burn (eating more, moving less). Even if your body weight is normal, you may have higher ratios of fat compared to muscle mass. Too much body fat puts you at increased risk for cardiovascular disease, heart attack, stroke, type 2 diabetes, and obesity-related cancers. In addition to exercise, following the Pritikin Eating Plan can help reduce your risk.  Decoding Lab Results   Clinical staff conducted group or individual video education with verbal and written material and guidebook.  Patient learns that lab test reflects one measurement whose values change over time and are influenced by many factors, including medication, stress, sleep, exercise, food, hydration, pre-existing medical conditions, and more. It is recommended to use the knowledge from this video to become more involved with your lab results and evaluate your numbers to speak with your doctor.   Diseases of Our Time - Overview  Clinical staff conducted group or individual video education with verbal and written material and guidebook.  Patient learns that according to the CDC, 50% to 70% of chronic diseases (such as obesity, type 2 diabetes, elevated lipids, hypertension, and heart disease) are avoidable through lifestyle improvements including healthier food choices, listening to satiety cues, and increased physical activity.  Sleep Disorders Clinical staff conducted group or individual video education with verbal and written material and guidebook.  Patient learns how good quality and duration of sleep are important to overall health and well-being. Patient also learns about sleep disorders and how they impact health along with recommendations to address them, including discussing with a physician.  Nutrition  Dining Out - Part 2 Clinical staff conducted group or individual video education with verbal and written material and guidebook.  Patient learns how to plan ahead and communicate in order to maximize their dining experience in a healthy and nutritious manner. Included are recommended food choices based on the type of restaurant the patient is visiting.   Fueling a Banker conducted group or individual video education with verbal and written material and guidebook.  There is a strong connection between our food choices and our health. Diseases like obesity and type 2 diabetes  are very prevalent and are in large-part due to lifestyle choices. The Pritikin Eating Plan provides plenty of food and hunger-curbing satisfaction. It  is easy to follow, affordable, and helps reduce health risks.  Menu Workshop  Clinical staff conducted group or individual video education with verbal and written material and guidebook.  Patient learns that restaurant meals can sabotage health goals because they are often packed with calories, fat, sodium, and sugar. Recommendations include strategies to plan ahead and to communicate with the manager, chef, or server to help order a healthier meal.  Planning Your Eating Strategy  Clinical staff conducted group or individual video education with verbal and written material and guidebook.  Patient learns about the Pritikin Eating Plan and its benefit of reducing the risk of disease. The Pritikin Eating Plan does not focus on calories. Instead, it emphasizes high-quality, nutrient-rich foods. By knowing the characteristics of the foods, we choose, we can determine their calorie density and make informed decisions.  Targeting Your Nutrition Priorities  Clinical staff conducted group or individual video education with verbal and written material and guidebook.  Patient learns that lifestyle habits have a tremendous impact on disease risk and progression. This video provides eating and physical activity recommendations based on your personal health goals, such as reducing LDL cholesterol, losing weight, preventing or controlling type 2 diabetes, and reducing high blood pressure.  Vitamins and Minerals  Clinical staff conducted group or individual video education with verbal and written material and guidebook.  Patient learns different ways to obtain key vitamins and minerals, including through a recommended healthy diet. It is important to discuss all supplements you take with your doctor.   Healthy Mind-Set    Smoking Cessation  Clinical staff  conducted group or individual video education with verbal and written material and guidebook.  Patient learns that cigarette smoking and tobacco addiction pose a serious health risk which affects millions of people. Stopping smoking will significantly reduce the risk of heart disease, lung disease, and many forms of cancer. Recommended strategies for quitting are covered, including working with your doctor to develop a successful plan.  Culinary   Becoming a Set designer conducted group or individual video education with verbal and written material and guidebook.  Patient learns that cooking at home can be healthy, cost-effective, quick, and puts them in control. Keys to cooking healthy recipes will include looking at your recipe, assessing your equipment needs, planning ahead, making it simple, choosing cost-effective seasonal ingredients, and limiting the use of added fats, salts, and sugars.  Cooking - Breakfast and Snacks  Clinical staff conducted group or individual video education with verbal and written material and guidebook.  Patient learns how important breakfast is to satiety and nutrition through the entire day. Recommendations include key foods to eat during breakfast to help stabilize blood sugar levels and to prevent overeating at meals later in the day. Planning ahead is also a key component.  Cooking - Educational psychologist conducted group or individual video education with verbal and written material and guidebook.  Patient learns eating strategies to improve overall health, including an approach to cook more at home. Recommendations include thinking of animal protein as a side on your plate rather than center stage and focusing instead on lower calorie dense options like vegetables, fruits, whole grains, and plant-based proteins, such as beans. Making sauces in large quantities to freeze for later and leaving the skin on your vegetables are also  recommended to maximize your experience.  Cooking - Healthy Salads and Dressing Clinical staff conducted group or individual video education with verbal and written material and  guidebook.  Patient learns that vegetables, fruits, whole grains, and legumes are the foundations of the Pritikin Eating Plan. Recommendations include how to incorporate each of these in flavorful and healthy salads, and how to create homemade salad dressings. Proper handling of ingredients is also covered. Cooking - Soups and [employer] - Soups and Desserts Clinical staff conducted group or individual video education with verbal and written material and guidebook.  Patient learns that Pritikin soups and desserts make for easy, nutritious, and delicious snacks and meal components that are low in sodium, fat, sugar, and calorie density, while high in vitamins, minerals, and filling fiber. Recommendations include simple and healthy ideas for soups and desserts.   Overview     The Pritikin Solution Program Overview Clinical staff conducted group or individual video education with verbal and written material and guidebook.  Patient learns that the results of the Pritikin Program have been documented in more than 100 articles published in peer-reviewed journals, and the benefits include reducing risk factors for (and, in some cases, even reversing) high cholesterol, high blood pressure, type 2 diabetes, obesity, and more! An overview of the three key pillars of the Pritikin Program will be covered: eating well, doing regular exercise, and having a healthy mind-set.  WORKSHOPS  Exercise: Exercise Basics: Building Your Action Plan Clinical staff led group instruction and group discussion with PowerPoint presentation and patient guidebook. To enhance the learning environment the use of posters, models and videos may be added. At the conclusion of this workshop, patients will comprehend the difference between physical  activity and exercise, as well as the benefits of incorporating both, into their routine. Patients will understand the FITT (Frequency, Intensity, Time, and Type) principle and how to use it to build an exercise action plan. In addition, safety concerns and other considerations for exercise and cardiac rehab will be addressed by the presenter. The purpose of this lesson is to promote a comprehensive and effective weekly exercise routine in order to improve patients' overall level of fitness.   Managing Heart Disease: Your Path to a Healthier Heart Clinical staff led group instruction and group discussion with PowerPoint presentation and patient guidebook. To enhance the learning environment the use of posters, models and videos may be added.At the conclusion of this workshop, patients will understand the anatomy and physiology of the heart. Additionally, they will understand how Pritikin's three pillars impact the risk factors, the progression, and the management of heart disease.  The purpose of this lesson is to provide a high-level overview of the heart, heart disease, and how the Pritikin lifestyle positively impacts risk factors.  Exercise Biomechanics Clinical staff led group instruction and group discussion with PowerPoint presentation and patient guidebook. To enhance the learning environment the use of posters, models and videos may be added. Patients will learn how the structural parts of their bodies function and how these functions impact their daily activities, movement, and exercise. Patients will learn how to promote a neutral spine, learn how to manage pain, and identify ways to improve their physical movement in order to promote healthy living. The purpose of this lesson is to expose patients to common physical limitations that impact physical activity. Participants will learn practical ways to adapt and manage aches and pains, and to minimize their effect on regular exercise.  Patients will learn how to maintain good posture while sitting, walking, and lifting.  Balance Training and Fall Prevention  Clinical staff led group instruction and group discussion with PowerPoint presentation and  patient guidebook. To enhance the learning environment the use of posters, models and videos may be added. At the conclusion of this workshop, patients will understand the importance of their sensorimotor skills (vision, proprioception, and the vestibular system) in maintaining their ability to balance as they age. Patients will apply a variety of balancing exercises that are appropriate for their current level of function. Patients will understand the common causes for poor balance, possible solutions to these problems, and ways to modify their physical environment in order to minimize their fall risk. The purpose of this lesson is to teach patients about the importance of maintaining balance as they age and ways to minimize their risk of falling.  WORKSHOPS   Nutrition:  Fueling a Ship broker led group instruction and group discussion with PowerPoint presentation and patient guidebook. To enhance the learning environment the use of posters, models and videos may be added. Patients will review the foundational principles of the Pritikin Eating Plan and understand what constitutes a serving size in each of the food groups. Patients will also learn Pritikin-friendly foods that are better choices when away from home and review make-ahead meal and snack options. Calorie density will be reviewed and applied to three nutrition priorities: weight maintenance, weight loss, and weight gain. The purpose of this lesson is to reinforce (in a group setting) the key concepts around what patients are recommended to eat and how to apply these guidelines when away from home by planning and selecting Pritikin-friendly options. Patients will understand how calorie density may be adjusted for  different weight management goals.  Mindful Eating  Clinical staff led group instruction and group discussion with PowerPoint presentation and patient guidebook. To enhance the learning environment the use of posters, models and videos may be added. Patients will briefly review the concepts of the Pritikin Eating Plan and the importance of low-calorie dense foods. The concept of mindful eating will be introduced as well as the importance of paying attention to internal hunger signals. Triggers for non-hunger eating and techniques for dealing with triggers will be explored. The purpose of this lesson is to provide patients with the opportunity to review the basic principles of the Pritikin Eating Plan, discuss the value of eating mindfully and how to measure internal cues of hunger and fullness using the Hunger Scale. Patients will also discuss reasons for non-hunger eating and learn strategies to use for controlling emotional eating.  Targeting Your Nutrition Priorities Clinical staff led group instruction and group discussion with PowerPoint presentation and patient guidebook. To enhance the learning environment the use of posters, models and videos may be added. Patients will learn how to determine their genetic susceptibility to disease by reviewing their family history. Patients will gain insight into the importance of diet as part of an overall healthy lifestyle in mitigating the impact of genetics and other environmental insults. The purpose of this lesson is to provide patients with the opportunity to assess their personal nutrition priorities by looking at their family history, their own health history and current risk factors. Patients will also be able to discuss ways of prioritizing and modifying the Pritikin Eating Plan for their highest risk areas  Menu  Clinical staff led group instruction and group discussion with PowerPoint presentation and patient guidebook. To enhance the learning  environment the use of posters, models and videos may be added. Using menus brought in from [employer], or printed from [employer], patients will apply the Pritikin dining out guidelines that were  presented in the [employer] video. Patients will also be able to practice these guidelines in a variety of provided scenarios. The purpose of this lesson is to provide patients with the opportunity to practice hands-on learning of the Pritikin Dining Out guidelines with actual menus and practice scenarios.  Label Reading Clinical staff led group instruction and group discussion with PowerPoint presentation and patient guidebook. To enhance the learning environment the use of posters, models and videos may be added. Patients will review and discuss the Pritikin label reading guidelines presented in Pritikin's Label Reading Educational series video. Using fool labels brought in from local grocery stores and markets, patients will apply the label reading guidelines and determine if the packaged food meet the Pritikin guidelines. The purpose of this lesson is to provide patients with the opportunity to review, discuss, and practice hands-on learning of the Pritikin Label Reading guidelines with actual packaged food labels. Cooking School  Pritikin's [employer] are designed to teach patients ways to prepare quick, simple, and affordable recipes at home. The importance of nutrition's role in chronic disease risk reduction is reflected in its emphasis in the overall Pritikin program. By learning how to prepare essential core Pritikin Eating Plan recipes, patients will increase control over what they eat; be able to customize the flavor of foods without the use of added salt, sugar, or fat; and improve the quality of the food they consume. By learning a set of core recipes which are easily assembled, quickly prepared, and affordable, patients are more likely to prepare more healthy  foods at home. These workshops focus on convenient breakfasts, simple entres, side dishes, and desserts which can be prepared with minimal effort and are consistent with nutrition recommendations for cardiovascular risk reduction. Cooking [employer] are taught by a Armed forces logistics/support/administrative officer (RD) who has been trained by the [employer]. The chef or RD has a clear understanding of the importance of minimizing - if not completely eliminating - added fat, sugar, and sodium in recipes. Throughout the series of Cooking School Workshop sessions, patients will learn about healthy ingredients and efficient methods of cooking to build confidence in their capability to prepare    Cooking School weekly topics:  Adding Flavor- Sodium-Free  Fast and Healthy Breakfasts  Powerhouse Plant-Based Proteins  Satisfying Salads and Dressings  Simple Sides and Sauces  International Cuisine-Spotlight on the [employer] Zones  Delicious Desserts  Savory Soups  [employer] - Meals in a Astronomer Appetizers and Snacks  Comforting Weekend Breakfasts  One-Pot Wonders   Fast Evening Meals  Landscape architect Your Pritikin Plate  WORKSHOPS   Healthy Mindset (Psychosocial):  Focused Goals, Sustainable Changes Clinical staff led group instruction and group discussion with PowerPoint presentation and patient guidebook. To enhance the learning environment the use of posters, models and videos may be added. Patients will be able to apply effective goal setting strategies to establish at least one personal goal, and then take consistent, meaningful action toward that goal. They will learn to identify common barriers to achieving personal goals and develop strategies to overcome them. Patients will also gain an understanding of how our mind-set can impact our ability to achieve goals and the importance of cultivating a positive and growth-oriented mind-set. The purpose of this lesson is to  provide patients with a deeper understanding of how to set and achieve personal goals, as well as the tools and strategies needed to overcome common obstacles which may  arise along the way.  From Head to Heart: The Power of a Healthy Outlook  Clinical staff led group instruction and group discussion with PowerPoint presentation and patient guidebook. To enhance the learning environment the use of posters, models and videos may be added. Patients will be able to recognize and describe the impact of emotions and mood on physical health. They will discover the importance of self-care and explore self-care practices which may work for them. Patients will also learn how to utilize the 4 C's to cultivate a healthier outlook and better manage stress and challenges. The purpose of this lesson is to demonstrate to patients how a healthy outlook is an essential part of maintaining good health, especially as they continue their cardiac rehab journey.  Healthy Sleep for a Healthy Heart Clinical staff led group instruction and group discussion with PowerPoint presentation and patient guidebook. To enhance the learning environment the use of posters, models and videos may be added. At the conclusion of this workshop, patients will be able to demonstrate knowledge of the importance of sleep to overall health, well-being, and quality of life. They will understand the symptoms of, and treatments for, common sleep disorders. Patients will also be able to identify daytime and nighttime behaviors which impact sleep, and they will be able to apply these tools to help manage sleep-related challenges. The purpose of this lesson is to provide patients with a general overview of sleep and outline the importance of quality sleep. Patients will learn about a few of the most common sleep disorders. Patients will also be introduced to the concept of "sleep hygiene," and discover ways to self-manage certain sleeping problems through  simple daily behavior changes. Finally, the workshop will motivate patients by clarifying the links between quality sleep and their goals of heart-healthy living.   Recognizing and Reducing Stress Clinical staff led group instruction and group discussion with PowerPoint presentation and patient guidebook. To enhance the learning environment the use of posters, models and videos may be added. At the conclusion of this workshop, patients will be able to understand the types of stress reactions, differentiate between acute and chronic stress, and recognize the impact that chronic stress has on their health. They will also be able to apply different coping mechanisms, such as reframing negative self-talk. Patients will have the opportunity to practice a variety of stress management techniques, such as deep abdominal breathing, progressive muscle relaxation, and/or guided imagery.  The purpose of this lesson is to educate patients on the role of stress in their lives and to provide healthy techniques for coping with it.  Learning Barriers/Preferences:  Learning Barriers/Preferences - 03/14/23 0949       Learning Barriers/Preferences   Learning Barriers Hearing    Learning Preferences Written Material;Video             Education Topics:  Knowledge Questionnaire Score:  Knowledge Questionnaire Score - 03/14/23 0940       Knowledge Questionnaire Score   Pre Score 23/24             Core Components/Risk Factors/Patient Goals at Admission:  Personal Goals and Risk Factors at Admission - 03/14/23 0940       Core Components/Risk Factors/Patient Goals on Admission   Hypertension Yes    Intervention Provide education on lifestyle modifcations including regular physical activity/exercise, weight management, moderate sodium restriction and increased consumption of fresh fruit, vegetables, and low fat dairy, alcohol moderation, and smoking cessation.;Monitor prescription use compliance.     Expected Outcomes  Short Term: Continued assessment and intervention until BP is < 140/68mm HG in hypertensive participants. < 130/61mm HG in hypertensive participants with diabetes, heart failure or chronic kidney disease.;Long Term: Maintenance of blood pressure at goal levels.    Lipids Yes    Intervention Provide education and support for participant on nutrition & aerobic/resistive exercise along with prescribed medications to achieve LDL 70mg , HDL >40mg .    Expected Outcomes Short Term: Participant states understanding of desired cholesterol values and is compliant with medications prescribed. Participant is following exercise prescription and nutrition guidelines.;Long Term: Cholesterol controlled with medications as prescribed, with individualized exercise RX and with personalized nutrition plan. Value goals: LDL < 70mg , HDL > 40 mg.    Personal Goal Other Yes    Personal Goal Increase exercise frequency and duration. Find healthy dishes. Be able to do things without becoming short of breath and having chest pain.    Intervention Christoph will attend Exercise Workshop and get individual exercise prescription to help develop an Exercise Action Plan to help increase cardiorespiratory fitness and increase exercise tolerance. He will attend Nutrition classes and cooking school to help find new heart healthy dishes.    Expected Outcomes Tyjay will comply with Exercise Action Plan and be able to do more activities for longer duration without SOB and angina as measured by self-report. He will have access to heart healthy recipies that he can prepare at home.             Core Components/Risk Factors/Patient Goals Review:    Core Components/Risk Factors/Patient Goals at Discharge (Final Review):    ITP Comments:  ITP Comments     Row Name 03/14/23 0744           ITP Comments Medical Director- Dr. Armanda Magic, MD. Introduced Mr. Lovin to the Pritikin Education Program / Intensive Cardiac  Rehab. Reviewed intial orientation folder with him.                Comments: Participant attended orientation for the cardiac rehabilitation program on  03/14/2023  to perform initial intake and exercise walk test. Patient introduced to the [facility] education and orientation packet was reviewed. Completed 6-minute walk test, measurements, initial ITP, and exercise prescription. Vital signs stable. Telemetry-normal sinus rhythm, with occasional PVC, rare couplet, mild shortness of breath during walk test.Resolved with rest, denies chest pain/ angina.   Service time was from 744 to 921.

## 2023-03-14 NOTE — Progress Notes (Signed)
Cardiac Rehab Medication Review by a Nurse  Does the patient  feel that his/her medications are working for him/her?  no  Has the patient been experiencing any side effects to the medications prescribed?  yes  Does the patient measure his/her own blood pressure or blood glucose at home?  yes   Does the patient have any problems obtaining medications due to transportation or finances?   no  Understanding of regimen: good Understanding of indications: good Potential of compliance: good    Nurse comments: Luis Guzman is taking his medications as prescribed and has a good understanding of what his medications are for. Luis Guzman has a BP cuff/ monitor. Luis Guzman takes his blood pressure 2-3 times a week. Luis Guzman says he sometimes feels lightheaded when he changes position.    [name] [name] RN 03/14/2023 7:57 AM

## 2023-03-22 ENCOUNTER — Telehealth: Payer: Self-pay

## 2023-03-22 NOTE — Patient Outreach (Signed)
  Care Management   Visit Note  03/22/2023 Name: Luis Guzman MRN: [id number] DOB: 04-24-1946  Subjective: Luis Guzman is a 77 y.o. year old male who is a primary care patient of Burchette, Elberta Fortis, MD. The Care Management team was consulted for assistance.      A unsuccessful outreach attempt was made today.  PLAN:  A HIPAA compliant phone message was left for the patient providing contact information and requesting a return call.   [facility]Care Management [phone]

## 2023-03-24 ENCOUNTER — Encounter (HOSPITAL_COMMUNITY)
Admission: RE | Admit: 2023-03-24 | Discharge: 2023-03-24 | Disposition: A | Discharge status: Home / Self Care | Payer: No Typology Code available for payment source | Source: Ambulatory Visit | Attending: Internal Medicine | Admitting: Internal Medicine

## 2023-03-24 DIAGNOSIS — I2089 Other forms of angina pectoris: Secondary | ICD-10-CM | POA: Insufficient documentation

## 2023-03-24 NOTE — Progress Notes (Signed)
Daily Session Note  Patient Details  Name: Luis Guzman MRN: [id number] Date of Birth: 10-Oct-1945 Referring Provider:   Flowsheet Row INTENSIVE CARDIAC REHAB ORIENT from 03/14/2023 in [facility]  Referring Provider [name], MD       Encounter Date: 03/24/2023  Check In:  Session Check In - 03/24/23 3086       Check-In   Supervising physician immediately available to respond to emergencies [facility] MD immediately available    Physician(s) Luis Guzman    Location [facility]    Staff Present Luis Hilts, MS, ACSM-CEP, Exercise Physiologist;Luis Hale Bogus, MS, Exercise Physiologist;Luis Guzman BS, ACSM-CEP, Exercise Physiologist;Luis Manus Gunning, MS, ACSM-CEP, CCRP, Exercise Physiologist; , RN, BSN    Virtual Visit No    Medication changes reported     No    Fall or balance concerns reported    No    Tobacco Cessation No Change    Warm-up and Cool-down Not performed (comment)   Cardiac Rehab Orientation   Resistance Training Performed Yes    VAD Patient? No    PAD/SET Patient? No      Pain Assessment   Currently in Pain? No/denies    Pain Score 0-No pain    Multiple Pain Sites No             Capillary Blood Glucose: No results found for this or any previous visit (from the past 24 hour(s)).    Social History   Tobacco Use  Smoking Status Former   Current packs/day: 0.00   Average packs/day: 1 pack/day for 5.0 years (5.0 ttl pk-yrs)   Types: Cigarettes   Start date: 10/18/1963   Quit date: 10/17/1968   Years since quitting: 54.4  Smokeless Tobacco Never    Goals Met:  Exercise tolerated well No report of concerns or symptoms today Strength training completed today  Goals Unmet:  Not Applicable  Comments: Pt started cardiac rehab today. Pt tolerated light exercise without difficulty. VSS, telemetry-Sinus Rhythm. Medication list reconciled. Pt denies barriers to  medication compliance. PSYCHOSOCIAL ASSESSMENT:  PHQ-2/PHQ-9 scores 0/2. Pt exhibits positive coping skills, hopeful outlook with supportive family. No psychosocial needs identified at this time, no psychosocial interventions necessary. Pt is retired and enjoys cooking. His goals for cardiac rehab are to be active without dyspnea and increase his cardiorespiratory fitness. Pt oriented to exercise equipment and routine. Pt verbalized understanding.     Dr. Armanda Guzman is Medical Director for Cardiac Rehab at [facility].

## 2023-03-27 ENCOUNTER — Encounter (HOSPITAL_COMMUNITY)
Admission: RE | Admit: 2023-03-27 | Discharge: 2023-03-27 | Disposition: A | Discharge status: Home / Self Care | Payer: No Typology Code available for payment source | Source: Ambulatory Visit | Attending: Internal Medicine | Admitting: Internal Medicine

## 2023-03-27 DIAGNOSIS — I2089 Other forms of angina pectoris: Secondary | ICD-10-CM | POA: Diagnosis not present

## 2023-03-29 ENCOUNTER — Telehealth: Payer: Self-pay | Admitting: Cardiology

## 2023-03-29 ENCOUNTER — Encounter (HOSPITAL_COMMUNITY)
Admission: RE | Admit: 2023-03-29 | Discharge: 2023-03-29 | Discharge status: Home / Self Care | Payer: No Typology Code available for payment source | Source: Ambulatory Visit | Attending: Internal Medicine

## 2023-03-29 DIAGNOSIS — I2089 Other forms of angina pectoris: Secondary | ICD-10-CM | POA: Diagnosis not present

## 2023-03-29 NOTE — Telephone Encounter (Signed)
Pt c/o medication issue:  1. Name of Medication:   nitroGLYCERIN (NITROSTAT) 0.4 MG SL tablet    2. How are you currently taking this medication (dosage and times per day)?   3. Are you having a reaction (difficulty breathing--STAT)?   4. What is your medication issue? Cardiac rehab is requesting call back to discuss the patient taking this medication 2-3 times per week. She states she is needing advisement on how to treat patient while in rehab when he takes this medication. Please advise.

## 2023-03-29 NOTE — Telephone Encounter (Signed)
Luis Guzman from cardiac rehab stated Luis Guzman informed her he has been taking nitroglycerin at least three x a week. It doesn't get better or worse with exercise. It comes and goes.   Luis Guzman was in cardiac rehab today and was having some chest pressure and increased shortness of breath and wanted to take a nitroglycerin. He did  not take nitroglycerin and the pain did not change with exertion.  Corrie Dandy would like to know while he is cardiac rehab if he develops chest pain does he not to stop cardiac rehab if he takes a nitroglycerin and go home. Or can he continue cardiac rehab if the pain goes away with nitroglycerin. She also would like to know would you like an EKG if he develops chest pain while in cardiac rehab.      Tried to reach Luis Guzman to see how if he was still having chest pain. Left voicemail to return call to office

## 2023-03-30 ENCOUNTER — Telehealth (HOSPITAL_COMMUNITY): Payer: Self-pay

## 2023-03-30 NOTE — Telephone Encounter (Signed)
-----   Message from Bryan Lemma sent at 03/30/2023  2:59 PM EDT ----- Regarding: RE: Nitro use while in Cardiac Rehab If the NTG rfelieve sx - ok to walk  DH ----- Message ----- From: Essie Hart, RN Sent: 03/28/2023  11:29 AM EDT To: [name], MD; Marykay Lex, MD Subject: Annell Greening: Nitro use while in Cardiac Rehab           Not sure who would address below. Patient was referred by Dr. Gala Romney to Cardiac Rehab but sees Dr. Herbie Baltimore.  (Informed me he takes nitroglycerin about 2-3x/week)  Please see below and advise. ----- Message ----- From: Carlos Levering, NP Sent: 03/28/2023   9:33 AM EDT To: Essie Hart, RN Subject: RE: Nitro use while in Cardiac Rehab           I am not in cardiac rehab today. Sorry. Were you able to talk to someone? ----- Message ----- From: Essie Hart, RN Sent: 03/28/2023   8:31 AM EDT To: Carlos Levering, NP Subject: Nitro use while in Cardiac Rehab               Just wanted to get some direction if patient takes nitroglycerin pre/post exercise at Cardiac Rehab. Pt states he takes nitroglycerin weekly. Would you like for the patient to stop exercising for the day if he takes nitroglycerin, if pain resolves and BP stable to continue exercising, get an EKG, or any other instructions for the patient?

## 2023-03-30 NOTE — Telephone Encounter (Signed)
If he takes Nexlizet arson and the pain goes away, then he can continue to exercise.  Unclear whether he is actually having anginal symptoms or not.  But we want him to continue to exercise.  Angina usually is worse with exertion and if it gets worse with exertion, then he can stop and take a nitro if necessary.   Bryan Lemma, MD

## 2023-03-31 ENCOUNTER — Telehealth: Payer: Self-pay | Admitting: General Practice

## 2023-03-31 ENCOUNTER — Encounter (HOSPITAL_COMMUNITY)
Admission: RE | Admit: 2023-03-31 | Discharge: 2023-03-31 | Disposition: A | Discharge status: Home / Self Care | Payer: No Typology Code available for payment source | Source: Ambulatory Visit | Attending: Internal Medicine | Admitting: Internal Medicine

## 2023-03-31 DIAGNOSIS — I2089 Other forms of angina pectoris: Secondary | ICD-10-CM | POA: Diagnosis not present

## 2023-03-31 DIAGNOSIS — R079 Chest pain, unspecified: Secondary | ICD-10-CM

## 2023-03-31 NOTE — Progress Notes (Signed)
Cardiac Rehab Session Note  Patient Details  Name: Luis Guzman MRN: [id number] Date of Birth: 09-11-1945 Referring Provider:   Flowsheet Row INTENSIVE CARDIAC REHAB ORIENT from 03/14/2023 in [facility]  Referring Provider Bensimhon, Bevelyn Buckles, MD       [name] c/o chest discomfort, pressure this AM with SOB. Pt sitting in education class. RN has been speaking to Dr. Herbie Baltimore regarding his chronic angina. RN spoke with pt. Pt stated he had this discomfort on Wednesday when he attended class. Pt exercised with no change in discomfort, pt did NOT take NTG. Pt went home and woke up Thursday AM with discomfort. Pt stated he did his morning walk, but when he got home was still having discomfort so took 2 NTG. Pressure resolved, pt able to cut grass around noon. Today, at [facility] he reports the same discomfort as yesterday, while in education class, pt took 2 NTG. Stated he had relieft, but reports SOB. Post VS 106/62, NSR 60s. 12 lead perfomed, results posted, NSR, no changes from prior. RN f/u with Dr. Herbie Baltimore. Also, spoke to the provider here at the clinic, Edd Fabian, NP who assessed the patient and recommends pt see Dr. Gala Romney before resuming exercise.   VS on exit WNL, BP 102/54, pressure resolved, pt reporting mild SOB, OK to drive home from J. Cleaver, NP, NTG taken >1 hour ago.

## 2023-03-31 NOTE — Telephone Encounter (Signed)
Patient seen in cardiac rehab today.  He reported that he  had intermittent episodes of chest pressure and shortness of breath.  Yesterday he noticed that he walked his usual mile in the morning and noted chest pressure.  He took sublingual nitroglycerin.  His symptoms improved and he was able to mow his yard in the afternoon.  He denies exertional chest pain with yesterday's activities and today's as well.  He took 2 sublingual nitroglycerin while at cardiac rehab.  His EKG showed normal sinus rhythm.  He was also given supplemental oxygen.  He reported that the supplemental oxygen did not help with his shortness of breath.  Reports medication compliance.  He notes that his chest discomfort and symptoms are fairly usual for him.  His chest discomfort is atypical in nature.  He was last seen by Dr. Gala Romney on 01/30/2023.  During that time he reported that he felt okay.  He was able to push mow his grass.  He reported that his shortness of breath started about 3 years ago.  He underwent cardiopulmonary exercise stress testing which showed capacity mild exertional chest pain at peak.  He was noted to have slight elevation in his PA pressures with exercise which were felt to be normal for his age.  It was felt that his symptoms were best described as chronic stable angina which may be related to microvascular disease versus elevated pressures.    Given safety concerns for taking sublingual nitroglycerin prior to exercising at cardiac rehab I would recommend follow-up visit with advanced heart failure team for further recommendations/direction or consult with pulmonology for further evaluation.  Thomasene Ripple. Filiberto Wamble NP-C     03/31/2023, 10:37 AM [facility] [address] Office [phone] Fax [phone]

## 2023-04-03 ENCOUNTER — Encounter (HOSPITAL_COMMUNITY): Payer: No Typology Code available for payment source

## 2023-04-03 NOTE — Telephone Encounter (Signed)
If pain resolves with NTG - continue exercise.  Don't need EKG unless pain doesn't resolve.   The whole point is for him to do Rehab!.  We have evaluated this chest pain with multiple modalities & have not found anything that we can "fix" - so we need [facility] to help him with monitored exercise.   - will see if Dr. Gala Romney has any other input.    DH

## 2023-04-04 NOTE — Telephone Encounter (Signed)
Returned call to cardiac rehab. Left message with Hailey for Baird Lyons to return call to office.

## 2023-04-05 ENCOUNTER — Encounter (HOSPITAL_COMMUNITY): Payer: No Typology Code available for payment source

## 2023-04-05 NOTE — Telephone Encounter (Signed)
Spoke with [facility] from cardiac rehab. Gave provider recommendations. She verbalized ubderstanding

## 2023-04-07 ENCOUNTER — Encounter (HOSPITAL_COMMUNITY): Payer: No Typology Code available for payment source

## 2023-04-10 ENCOUNTER — Encounter (HOSPITAL_COMMUNITY): Payer: No Typology Code available for payment source

## 2023-04-11 ENCOUNTER — Encounter (HOSPITAL_COMMUNITY): Payer: Self-pay | Admitting: *Deleted

## 2023-04-11 DIAGNOSIS — I2089 Other forms of angina pectoris: Secondary | ICD-10-CM

## 2023-04-11 NOTE — Progress Notes (Signed)
Cardiac Individual Treatment Plan  Patient Details  Name: Luis Guzman MRN: [id number] Date of Birth: 10-Dec-1945 Referring Provider:   Flowsheet Row INTENSIVE CARDIAC REHAB ORIENT from 03/14/2023 in [facility]  Referring Provider Bensimhon, Bevelyn Buckles, MD       Initial Encounter Date:  Flowsheet Row INTENSIVE CARDIAC REHAB ORIENT from 03/14/2023 in [facility]  Date 03/14/23       Visit Diagnosis: Chronic stable angina  Patient's Home Medications on Admission:  Current Outpatient Medications:    acetaminophen (TYLENOL) 500 MG tablet, Take 1,000 mg by mouth every 6 (six) hours as needed for moderate pain., Disp: , Rfl:    Alirocumab (PRALUENT) 150 MG/ML SOAJ, Inject 150 mg into the skin every 14 (fourteen) days., Disp: 2 mL, Rfl: 11   amLODipine (NORVASC) 5 MG tablet, Take 1 tablet (5 mg total) by mouth daily., Disp: 90 tablet, Rfl: 3   aspirin EC 81 MG tablet, Take 1 tablet (81 mg total) by mouth daily. Swallow whole. ( start after stopping clopidogrel 75 mg), Disp: 90 tablet, Rfl: 3   bimatoprost (LUMIGAN) 0.03 % ophthalmic solution, Place 1 drop into both eyes at bedtime., Disp: , Rfl:    Brinzolamide-Brimonidine (SIMBRINZA) 1-0.2 % SUSP, Place 1 drop into both eyes in the morning and at bedtime., Disp: , Rfl:    furosemide (LASIX) 20 MG tablet, Take 20 mg by mouth as needed. 1-3 tabs as needed, Disp: , Rfl:    ibuprofen (ADVIL) 200 MG tablet, Take 400 mg by mouth every 6 (six) hours as needed for moderate pain., Disp: , Rfl:    irbesartan (AVAPRO) 150 MG tablet, Take 1 tablet (150 mg total) by mouth daily., Disp: 90 tablet, Rfl: 3   isosorbide mononitrate (IMDUR) 60 MG 24 hr tablet, Take 1 tablet (60 mg total) by mouth every morning., Disp: 90 tablet, Rfl: 3   metoprolol succinate (TOPROL-XL) 25 MG 24 hr tablet, Take 0.5 tablets (12.5 mg total) by mouth daily., Disp:  45 tablet, Rfl: 3   nitroGLYCERIN (NITROSTAT) 0.4 MG SL tablet, Place 1 tablet (0.4 mg total) under the tongue every 5 (five) minutes as needed for chest pain., Disp: 25 tablet, Rfl: 6   potassium chloride SA (KLOR-CON M) 10 MEQ tablet, Take 10 mEq ( 1 capsule)  on days that you take lasix., Disp: 90 tablet, Rfl: 3   ranolazine (RANEXA) 500 MG 12 hr tablet, Take 500 mg by mouth 2 (two) times daily., Disp: , Rfl:    traZODone (DESYREL) 50 MG tablet, Take 50 mg by mouth at bedtime., Disp: , Rfl:   Past Medical History: Past Medical History:  Diagnosis Date   Anemia    as infant history of   CAD S/P PTCA & DES PCI - for Progressive Angina 02/26/2019   Cath-PCI 02/26/2019: EF 55-65%. mCx 65% (FFR 0.82 - Med Rx). D2 85% - Wolverine Scoring Balloon PTCA (2.0 mm). Ost rPDA - 90% -Resolute Onyx 2.5 x 15 (2.6 mm). 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 lower back pain, s/p cervical neck surgery"stiffness" remains   Transfusion history    infant "anemia"    Tobacco Use: Social History   Tobacco Use  Smoking Status Former   Current packs/day: 0.00   Average packs/day: 1 pack/day for 5.0 years (5.0 ttl pk-yrs)   Types: Cigarettes   Start date: 10/18/1963   Quit date: 10/17/1968   Years since quitting: 54.5  Smokeless Tobacco Never    Labs: Review Flowsheet  More data exists      Latest Ref Rng & Units 05/18/2021 07/02/2021 05/20/2022 11/14/2022 01/26/2023  Labs for ITP Cardiac and Pulmonary Rehab  Cholestrol 100 - 199 mg/dL 161  096  045  409  -  LDL (calc) 0 - 99 mg/dL 46   63  73  55  -  HDL-C >39 mg/dL 81.19  46  14.78  44  -  Trlycerides 0 - 149 mg/dL 29.5  64  621.3  086  -  Hemoglobin A1c 4.6 - 6.5 % 6.0  - 5.9  - -  Bicarbonate 20.0 - 28.0 mmol/L 20.0 - 28.0 mmol/L - - - - 22.1  23.9   TCO2 22 - 32 mmol/L 22 - 32 mmol/L - - - - 23  25   Acid-base deficit 0.0 - 2.0 mmol/L 0.0 - 2.0 mmol/L - - - - 3.0  1.0   O2 Saturation % % - - - - 72  71     Details       Multiple values from one day are sorted in reverse-chronological order         Capillary Blood Glucose: Lab Results  Component Value Date   GLUCAP 95 03/04/2020     Exercise Target Goals: Exercise Program Goal: Individual exercise prescription set using results from initial 6 min walk test and THRR while considering  patient's activity barriers and safety.   Exercise Prescription Goal: Initial exercise prescription builds to 30-45 minutes a day of aerobic activity, 2-3 days per week.  Home exercise guidelines will be given to patient during program as part of exercise prescription that the participant will acknowledge.  Activity Barriers & Risk Stratification:  Activity Barriers & Cardiac Risk Stratification - 03/14/23 0836       Activity Barriers & Cardiac Risk Stratification   Activity Barriers Left Hip Replacement;Right Hip Replacement;Neck/Spine Problems;Other (comment);Shortness of Breath;Chest Pain/Angina    Comments Two artificial discs in neck. Left leg fracture in 2010, screws, pins, rod from knee to ankle.    Cardiac Risk Stratification High             6 Minute Walk:  6 Minute Walk     Row Name 03/14/23 0937         6 Minute Walk   Phase Initial     Distance 1511 feet     Walk Time 6 minutes     # of Rest Breaks 0     MPH 2.86     METS 2.94     RPE 11     Perceived Dyspnea  1     VO2 Peak 10.28     Symptoms Yes (comment)     Comments Mild shortness of breath     Resting HR 76 bpm     Resting BP 110/60     Resting Oxygen Saturation  97 %      Exercise  Oxygen Saturation  during 6 min walk 95 %     Max Ex. HR 91 bpm     Max Ex. BP 110/70     2 Minute Post BP 102/62              Oxygen Initial Assessment:   Oxygen Re-Evaluation:   Oxygen Discharge (Final Oxygen Re-Evaluation):   Initial Exercise Prescription:  Initial Exercise Prescription - 03/14/23 0900       Date of Initial Exercise RX and Referring Provider   Date 03/14/23    Referring Provider [name], MD    Expected Discharge Date 06/07/23      Recumbant Bike   Level 2    Watts 26    Minutes 15    METs 2.9      Recumbant Elliptical   Level 1    Minutes 15    METs 2.9      Prescription Details   Frequency (times per week) 3    Duration Progress to 30 minutes of continuous aerobic without signs/symptoms of physical distress      Intensity   THRR 40-80% of Max Heartrate 58-115    Ratings of Perceived Exertion 11-13    Perceived Dyspnea 0-4      Progression   Progression Continue to progress workloads to maintain intensity without signs/symptoms of physical distress.      Resistance Training   Training Prescription Yes    Weight 4 lbs    Reps 10-15             Perform Capillary Blood Glucose checks as needed.  Exercise Prescription Changes:   Exercise Prescription Changes     Row Name 03/24/23 1000             Response to Exercise   Blood Pressure (Admit) 94/58       Blood Pressure (Exercise) 140/68       Blood Pressure (Exit) 104/62       Heart Rate (Admit) 70 bpm       Heart Rate (Exercise) 122 bpm       Heart Rate (Exit) 80 bpm       Rating of Perceived Exertion (Exercise) 13       Symptoms None       Comments Pt's first day in the CRP2 program       Duration Progress to 30 minutes of  aerobic without signs/symptoms of physical distress       Intensity THRR unchanged         Progression   Progression Continue to progress workloads to maintain intensity without signs/symptoms of physical distress.        Average METs 3.25         Resistance Training   Training Prescription Yes       Weight 4 lbs       Reps 10-15       Time 10 Minutes         Interval Training   Interval Training No         Recumbant Bike   Level 2       RPM 76       Watts 23       Minutes 15       METs 2.4         Recumbant Elliptical   Level 1       RPM 68       Watts 96  Minutes 15       METs 4.1                Exercise Comments:   Exercise Comments     Row Name 03/24/23 1042 04/07/23 0825         Exercise Comments Pt's first day in the CRP2 program. Pt tolerated exercise session well with no complaints. Pt due for MET review but is out pending medical clearance. Will complete upon return.               Exercise Goals and Review:   Exercise Goals     Row Name 03/14/23 0756             Exercise Goals   Increase Physical Activity Yes       Intervention Provide advice, education, support and counseling about physical activity/exercise needs.;Develop an individualized exercise prescription for aerobic and resistive training based on initial evaluation findings, risk stratification, comorbidities and participant's personal goals.       Expected Outcomes Short Term: Attend rehab on a regular basis to increase amount of physical activity.;Long Term: Exercising regularly at least 3-5 days a week.;Long Term: Add in home exercise to make exercise part of routine and to increase amount of physical activity.       Increase Strength and Stamina Yes       Intervention Provide advice, education, support and counseling about physical activity/exercise needs.;Develop an individualized exercise prescription for aerobic and resistive training based on initial evaluation findings, risk stratification, comorbidities and participant's personal goals.       Expected Outcomes Short Term: Increase workloads from initial exercise prescription for resistance, speed, and METs.;Short Term: Perform resistance  training exercises routinely during rehab and add in resistance training at home;Long Term: Improve cardiorespiratory fitness, muscular endurance and strength as measured by increased METs and functional capacity ( )       Able to understand and use rate of perceived exertion (RPE) scale Yes       Intervention Provide education and explanation on how to use RPE scale       Expected Outcomes Short Term: Able to use RPE daily in rehab to express subjective intensity level;Long Term:  Able to use RPE to guide intensity level when exercising independently       Knowledge and understanding of Target Heart Rate Range (THRR) Yes       Intervention Provide education and explanation of THRR including how the numbers were predicted and where they are located for reference       Expected Outcomes Short Term: Able to state/look up THRR;Long Term: Able to use THRR to govern intensity when exercising independently;Short Term: Able to use daily as guideline for intensity in rehab       Able to check pulse independently Yes       Intervention Provide education and demonstration on how to check pulse in carotid and radial arteries.;Review the importance of being able to check your own pulse for safety during independent exercise       Expected Outcomes Short Term: Able to explain why pulse checking is important during independent exercise;Long Term: Able to check pulse independently and accurately       Understanding of Exercise Prescription Yes       Intervention Provide education, explanation, and written materials on patient's individual exercise prescription       Expected Outcomes Short Term: Able to explain program exercise prescription;Long Term: Able to explain home exercise prescription to  exercise independently                Exercise Goals Re-Evaluation :  Exercise Goals Re-Evaluation     Row Name 03/24/23 1040             Exercise Goal Re-Evaluation   Exercise Goals Review Increase  Physical Activity;Increase Strength and Stamina;Able to understand and use rate of perceived exertion (RPE) scale;Knowledge and understanding of Target Heart Rate Range (THRR);Understanding of Exercise Prescription       Comments Pt's first day in the CRP2 program. Pt understands the THRR, RPE scale and exercise Rx.       Expected Outcomes Will continue to increase workloads as tolerated.                Discharge Exercise Prescription (Final Exercise Prescription Changes):  Exercise Prescription Changes - 03/24/23 1000       Response to Exercise   Blood Pressure (Admit) 94/58    Blood Pressure (Exercise) 140/68    Blood Pressure (Exit) 104/62    Heart Rate (Admit) 70 bpm    Heart Rate (Exercise) 122 bpm    Heart Rate (Exit) 80 bpm    Rating of Perceived Exertion (Exercise) 13    Symptoms None    Comments Pt's first day in the CRP2 program    Duration Progress to 30 minutes of  aerobic without signs/symptoms of physical distress    Intensity THRR unchanged      Progression   Progression Continue to progress workloads to maintain intensity without signs/symptoms of physical distress.    Average METs 3.25      Resistance Training   Training Prescription Yes    Weight 4 lbs    Reps 10-15    Time 10 Minutes      Interval Training   Interval Training No      Recumbant Bike   Level 2    RPM 76    Watts 23    Minutes 15    METs 2.4      Recumbant Elliptical   Level 1    RPM 68    Watts 96    Minutes 15    METs 4.1             Nutrition:  Target Goals: Understanding of nutrition guidelines, daily intake of sodium 1500mg , cholesterol 200mg , calories 30% from fat and 7% or less from saturated fats, daily to have 5 or more servings of fruits and vegetables.  Biometrics:  Pre Biometrics - 03/14/23 0744       Pre Biometrics   Waist Circumference 39.75 inches    Hip Circumference 39.25 inches    Waist to Hip Ratio 1.01 %    Triceps Skinfold 8 mm    % Body  Fat 24 %    Grip Strength 36 kg    Flexibility 11.25 in    Single Leg Stand 30 seconds              Nutrition Therapy Plan and Nutrition Goals:  Nutrition Therapy & Goals - 03/24/23 1400       Nutrition Therapy   Diet Heart Healthy Diet      Personal Nutrition Goals   Nutrition Goal Patient to identify strategies for reducing cardiovascular risk by attending the Pritikin education and nutrition series weekly.    Personal Goal #2 Patient to improve diet quality by using the plate method as a guide for meal planning to include lean protein/plant protein, fruits,  vegetables, whole grains, nonfat dairy as part of a well-balanced diet.    Personal Goal #3 Patient to limit sodium intake to 1500mg  per day    Comments Khilan reports motivation to begin the [place] Diet. He typically eats 1-2 meals per day and he does the majority of cooking at home. His wife is supportive of lifestyle changes. Patient will benefit from participation in intensive cardiac rehab for nutrition, exercise, and lifestyle modification.      Intervention Plan   Intervention Prescribe, educate and counsel regarding individualized specific dietary modifications aiming towards targeted core components such as weight, hypertension, lipid management, diabetes, heart failure and other comorbidities.;Nutrition handout(s) given to patient.    Expected Outcomes Short Term Goal: Understand basic principles of dietary content, such as calories, fat, sodium, cholesterol and nutrients.;Long Term Goal: Adherence to prescribed nutrition plan.             Nutrition Assessments:  MEDIFICTS Score Key: ?70 Need to make dietary changes  40-70 Heart Healthy Diet ? 40 Therapeutic Level Cholesterol Diet    Picture Your Plate Scores: <46 Unhealthy dietary pattern with much room for improvement. 41-50 Dietary pattern unlikely to meet recommendations for good health and room for improvement. 51-60 More healthful dietary  pattern, with some room for improvement.  >60 Healthy dietary pattern, although there may be some specific behaviors that could be improved.    Nutrition Goals Re-Evaluation:  Nutrition Goals Re-Evaluation     Row Name 03/24/23 1400             Goals   Current Weight 192 lb 7.4 oz (87.3 kg)       Comment per labs through [place] on 11/24/22: triglycerides 162, LDL 67 (goal <70)- patient is statin intolerant, A1c 6.1       Expected Outcome Bobby reports motivation to begin the [place] Diet. He typically eats 1-2 meals per day and he does the majority of cooking at home. His wife is supportive of lifestyle changes. Patient will benefit from participation in intensive cardiac rehab for nutrition, exercise, and lifestyle modification.                Nutrition Goals Re-Evaluation:  Nutrition Goals Re-Evaluation     Row Name 03/24/23 1400             Goals   Current Weight 192 lb 7.4 oz (87.3 kg)       Comment per labs through [place] on 11/24/22: triglycerides 162, LDL 67 (goal <70)- patient is statin intolerant, A1c 6.1       Expected Outcome Eddie reports motivation to begin the [place] Diet. He typically eats 1-2 meals per day and he does the majority of cooking at home. His wife is supportive of lifestyle changes. Patient will benefit from participation in intensive cardiac rehab for nutrition, exercise, and lifestyle modification.                Nutrition Goals Discharge (Final Nutrition Goals Re-Evaluation):  Nutrition Goals Re-Evaluation - 03/24/23 1400       Goals   Current Weight 192 lb 7.4 oz (87.3 kg)    Comment per labs through [place] on 11/24/22: triglycerides 162, LDL 67 (goal <70)- patient is statin intolerant, A1c 6.1    Expected Outcome Batu reports motivation to begin the [place] Diet. He typically eats 1-2 meals per day and he does the majority of cooking at home. His wife is supportive of lifestyle changes. Patient will benefit from participation in  intensive cardiac rehab for nutrition, exercise, and lifestyle modification.             Psychosocial: Target Goals: Acknowledge presence or absence of significant depression and/or stress, maximize coping skills, provide positive support system. Participant is able to verbalize types and ability to use techniques and skills needed for reducing stress and depression.  Initial Review & Psychosocial Screening:  Initial Psych Review & Screening - 03/14/23 0832       Initial Review   Current issues with None Identified      Family Dynamics   Good Support System? Yes   Kahne has his wife for support     Barriers   Psychosocial barriers to participate in program There are no identifiable barriers or psychosocial needs.      Screening Interventions   Interventions Encouraged to exercise             Quality of Life Scores:  Quality of Life - 03/14/23 0940       Quality of Life   Select Quality of Life      Quality of Life Scores   Health/Function Pre 22.77 %    Socioeconomic Pre 26.5 %    Psych/Spiritual Pre 30 %    Family Pre 28.8 %    GLOBAL Pre 26.1 %            Scores of 19 and below usually indicate a poorer quality of life in these areas.  A difference of  2-3 points is a clinically meaningful difference.  A difference of 2-3 points in the total score of the Quality of Life Index has been associated with significant improvement in overall quality of life, self-image, physical symptoms, and general health in studies assessing change in quality of life.  PHQ-9: Review Flowsheet  More data exists      03/14/2023 08/22/2022 05/20/2022 05/16/2022 05/07/2021  Depression screen PHQ 2/9  Decreased Interest 0 0 0 0 0  Down, Depressed, Hopeless 0 0 0 0 0  PHQ - 2 Score 0 0 0 0 0  Altered sleeping 1 - - - -  Tired, decreased energy 1 - - - -  Change in appetite 0 - - - -  Feeling bad or failure about yourself  0 - - - -  Trouble concentrating 0 - - - -  Moving slowly  or fidgety/restless 0 - - - -  Suicidal thoughts 0 - - - -  PHQ-9 Score 2 - - - -  Difficult doing work/chores Not difficult at all - - - -    Details           Interpretation of Total Score  Total Score Depression Severity:  1-4 = Minimal depression, 5-9 = Mild depression, 10-14 = Moderate depression, 15-19 = Moderately severe depression, 20-27 = Severe depression   Psychosocial Evaluation and Intervention:   Psychosocial Re-Evaluation:   Psychosocial Discharge (Final Psychosocial Re-Evaluation):   Vocational Rehabilitation: Provide vocational rehab assistance to qualifying candidates.   Vocational Rehab Evaluation & Intervention:  Vocational Rehab - 03/14/23 0834       Initial Vocational Rehab Evaluation & Intervention   Assessment shows need for Vocational Rehabilitation No   Cael is retired and does not need vocational rehab at this time            Education: Education Goals: Education classes will be provided on a weekly basis, covering required topics. Participant will state understanding/return demonstration of topics presented.    Education  Row Name 03/24/23 0900     Education   Cardiac Education Topics Pritikin   Select Core Videos     Core Videos   Educator Exercise Physiologist   Select Psychosocial   Psychosocial How Our Thoughts Can Heal Our Hearts   Instruction Review Code 1- Verbalizes Understanding   Class Start Time 0807   Class Stop Time 0842   Class Time Calculation (min) 35 min    Row Name 03/27/23 0900     Education   Cardiac Education Topics Pritikin   Select Workshops     Core Videos   Educator --   Select --   General Education --   Instruction Review Code --     Workshops   Biomedical scientist Exercise   Exercise Workshop Managing Heart Disease: Your Path to a Healthier Heart   Instruction Review Code 1- Verbalizes Understanding   Class Start Time [phone]   Class Stop Time 0908   Class Time  Calculation (min) 60 min    Row Name 03/29/23 1000     Education   Cardiac Education Topics Pritikin   Secondary school teacher School   Educator Dietitian   Weekly Topic Powerhouse Plant-Based Proteins   Instruction Review Code 1- Verbalizes Understanding   Class Start Time 0815   Class Stop Time 0856   Class Time Calculation (min) 41 min    Row Name 03/31/23 0900     Education   Cardiac Education Topics Pritikin   Select Core Videos     Core Videos   Educator Exercise Physiologist   Select General Education   General Education Hypertension and Heart Disease   Instruction Review Code 1- Verbalizes Understanding   Class Start Time [phone]   Class Stop Time 0850   Class Time Calculation (min) 40 min            Core Videos: Exercise    Move It!  Clinical staff conducted group or individual video education with verbal and written material and guidebook.  Patient learns the recommended Pritikin exercise program. Exercise with the goal of living a long, healthy life. Some of the health benefits of exercise include controlled diabetes, healthier blood pressure levels, improved cholesterol levels, improved heart and lung capacity, improved sleep, and better body composition. Everyone should speak with their doctor before starting or changing an exercise routine.  Biomechanical Limitations Clinical staff conducted group or individual video education with verbal and written material and guidebook.  Patient learns how biomechanical limitations can impact exercise and how we can mitigate and possibly overcome limitations to have an impactful and balanced exercise routine.  Body Composition Clinical staff conducted group or individual video education with verbal and written material and guidebook.  Patient learns that body composition (ratio of muscle mass to fat mass) is a key component to assessing overall fitness, rather than body weight alone. Increased fat mass,  especially visceral belly fat, can put [place] at increased risk for metabolic syndrome, type 2 diabetes, heart disease, and even death. It is recommended to combine diet and exercise (cardiovascular and resistance training) to improve your body composition. Seek guidance from your physician and exercise physiologist before implementing an exercise routine.  Exercise Action Plan Clinical staff conducted group or individual video education with verbal and written material and guidebook.  Patient learns the recommended strategies to achieve and enjoy long-term exercise adherence, including variety, self-motivation, self-efficacy, and positive decision making. Benefits of exercise include  fitness, good health, weight management, more energy, better sleep, less stress, and overall well-being.  Medical   Heart Disease Risk Reduction Clinical staff conducted group or individual video education with verbal and written material and guidebook.  Patient learns our heart is our most vital organ as it circulates oxygen, nutrients, white blood cells, and hormones throughout the entire body, and carries waste away. Data supports a plant-based eating plan like the Pritikin Program for its effectiveness in slowing progression of and reversing heart disease. The video provides a number of recommendations to address heart disease.   Metabolic Syndrome and Belly Fat  Clinical staff conducted group or individual video education with verbal and written material and guidebook.  Patient learns what metabolic syndrome is, how it leads to heart disease, and how one can reverse it and keep it from coming back. You have metabolic syndrome if you have 3 of the following 5 criteria: abdominal obesity, high blood pressure, high triglycerides, low HDL cholesterol, and high blood sugar.  Hypertension and Heart Disease Clinical staff conducted group or individual video education with verbal and written material and guidebook.  Patient  learns that high blood pressure, or hypertension, is very common in the [place]. Hypertension is largely due to excessive salt intake, but other important risk factors include being overweight, physical inactivity, drinking too much alcohol, smoking, and not eating enough potassium from fruits and vegetables. High blood pressure is a leading risk factor for heart attack, stroke, congestive heart failure, dementia, kidney failure, and premature death. Long-term effects of excessive salt intake include stiffening of the arteries and thickening of heart muscle and organ damage. Recommendations include ways to reduce hypertension and the risk of heart disease.  Diseases of Our Time - Focusing on Diabetes Clinical staff conducted group or individual video education with verbal and written material and guidebook.  Patient learns why the best way to stop diseases of our time is prevention, through food and other lifestyle changes. Medicine (such as prescription pills and surgeries) is often only a Band-Aid on the problem, not a long-term solution. Most common diseases of our time include obesity, type 2 diabetes, hypertension, heart disease, and cancer. The Pritikin Program is recommended and has been proven to help reduce, reverse, and/or prevent the damaging effects of metabolic syndrome.  Nutrition   Overview of the Pritikin Eating Plan  Clinical staff conducted group or individual video education with verbal and written material and guidebook.  Patient learns about the Pritikin Eating Plan for disease risk reduction. The Pritikin Eating Plan emphasizes a wide variety of unrefined, minimally-processed carbohydrates, like fruits, vegetables, whole grains, and legumes. Go, Caution, and Stop food choices are explained. Plant-based and lean animal proteins are emphasized. Rationale provided for low sodium intake for blood pressure control, low added sugars for blood sugar stabilization, and low added fats and  oils for coronary artery disease risk reduction and weight management.  Calorie Density  Clinical staff conducted group or individual video education with verbal and written material and guidebook.  Patient learns about calorie density and how it impacts the Pritikin Eating Plan. Knowing the characteristics of the food you choose will help you decide whether those foods will lead to weight gain or weight loss, and whether you want to consume more or less of them. Weight loss is usually a side effect of the Pritikin Eating Plan because of its focus on low calorie-dense foods.  Label Reading  Clinical staff conducted group or individual video education with verbal  and written material and guidebook.  Patient learns about the Pritikin recommended label reading guidelines and corresponding recommendations regarding calorie density, added sugars, sodium content, and whole grains.  Dining Out - Part 1  Clinical staff conducted group or individual video education with verbal and written material and guidebook.  Patient learns that restaurant meals can be sabotaging because they can be so high in calories, fat, sodium, and/or sugar. Patient learns recommended strategies on how to positively address this and avoid unhealthy pitfalls.  Facts on Fats  Clinical staff conducted group or individual video education with verbal and written material and guidebook.  Patient learns that lifestyle modifications can be just as effective, if not more so, as many medications for lowering your risk of heart disease. A Pritikin lifestyle can help to reduce your risk of inflammation and atherosclerosis (cholesterol build-up, or plaque, in the artery walls). Lifestyle interventions such as dietary choices and physical activity address the cause of atherosclerosis. A review of the types of fats and their impact on blood cholesterol levels, along with dietary recommendations to reduce fat intake is also included.  Nutrition  Action Plan  Clinical staff conducted group or individual video education with verbal and written material and guidebook.  Patient learns how to incorporate Pritikin recommendations into their lifestyle. Recommendations include planning and keeping personal health goals in mind as an important part of their success.  Healthy Mind-Set    Healthy Minds, Bodies, Hearts  Clinical staff conducted group or individual video education with verbal and written material and guidebook.  Patient learns how to identify when they are stressed. Video will discuss the impact of that stress, as well as the many benefits of stress management. Patient will also be introduced to stress management techniques. The way we think, act, and feel has an impact on our hearts.  How Our Thoughts Can Heal Our Hearts  Clinical staff conducted group or individual video education with verbal and written material and guidebook.  Patient learns that negative thoughts can cause depression and anxiety. This can result in negative lifestyle behavior and serious health problems. Cognitive behavioral therapy is an effective method to help control our thoughts in order to change and improve our emotional outlook.  Additional Videos:  Exercise    Improving Performance  Clinical staff conducted group or individual video education with verbal and written material and guidebook.  Patient learns to use a non-linear approach by alternating intensity levels and lengths of time spent exercising to help burn more calories and lose more body fat. Cardiovascular exercise helps improve heart health, metabolism, hormonal balance, blood sugar control, and recovery from fatigue. Resistance training improves strength, endurance, balance, coordination, reaction time, metabolism, and muscle mass. Flexibility exercise improves circulation, posture, and balance. Seek guidance from your physician and exercise physiologist before implementing an exercise routine  and learn your capabilities and proper form for all exercise.  Introduction to Yoga  Clinical staff conducted group or individual video education with verbal and written material and guidebook.  Patient learns about yoga, a discipline of the coming together of mind, breath, and body. The benefits of yoga include improved flexibility, improved range of motion, better posture and core strength, increased lung function, weight loss, and positive self-image. Yoga's heart health benefits include lowered blood pressure, healthier heart rate, decreased cholesterol and triglyceride levels, improved immune function, and reduced stress. Seek guidance from your physician and exercise physiologist before implementing an exercise routine and learn your capabilities and proper form for all exercise.  Medical   Aging: Enhancing Your Quality of Life  Clinical staff conducted group or individual video education with verbal and written material and guidebook.  Patient learns key strategies and recommendations to stay in good physical health and enhance quality of life, such as prevention strategies, having an advocate, securing a Health Care Proxy and Power of Attorney, and keeping a list of medications and system for tracking them. It also discusses how to avoid risk for bone loss.  Biology of Weight Control  Clinical staff conducted group or individual video education with verbal and written material and guidebook.  Patient learns that weight gain occurs because we consume more calories than we burn (eating more, moving less). Even if your body weight is normal, you may have higher ratios of fat compared to muscle mass. Too much body fat puts you at increased risk for cardiovascular disease, heart attack, stroke, type 2 diabetes, and obesity-related cancers. In addition to exercise, following the Pritikin Eating Plan can help reduce your risk.  Decoding Lab Results  Clinical staff conducted group or individual video  education with verbal and written material and guidebook.  Patient learns that lab test reflects one measurement whose values change over time and are influenced by many factors, including medication, stress, sleep, exercise, food, hydration, pre-existing medical conditions, and more. It is recommended to use the knowledge from this video to become more involved with your lab results and evaluate your numbers to speak with your doctor.   Diseases of Our Time - Overview  Clinical staff conducted group or individual video education with verbal and written material and guidebook.  Patient learns that according to the CDC, 50% to 70% of chronic diseases (such as obesity, type 2 diabetes, elevated lipids, hypertension, and heart disease) are avoidable through lifestyle improvements including healthier food choices, listening to satiety cues, and increased physical activity.  Sleep Disorders Clinical staff conducted group or individual video education with verbal and written material and guidebook.  Patient learns how good quality and duration of sleep are important to overall health and well-being. Patient also learns about sleep disorders and how they impact health along with recommendations to address them, including discussing with a physician.  Nutrition  Dining Out - Part 2 Clinical staff conducted group or individual video education with verbal and written material and guidebook.  Patient learns how to plan ahead and communicate in order to maximize their dining experience in a healthy and nutritious manner. Included are recommended food choices based on the type of restaurant the patient is visiting.   Fueling a Banker conducted group or individual video education with verbal and written material and guidebook.  There is a strong connection between our food choices and our health. Diseases like obesity and type 2 diabetes are very prevalent and are in large-part due to  lifestyle choices. The Pritikin Eating Plan provides plenty of food and hunger-curbing satisfaction. It is easy to follow, affordable, and helps reduce health risks.  Menu Workshop  Clinical staff conducted group or individual video education with verbal and written material and guidebook.  Patient learns that restaurant meals can sabotage health goals because they are often packed with calories, fat, sodium, and sugar. Recommendations include strategies to plan ahead and to communicate with the manager, chef, or server to help order a healthier meal.  Planning Your Eating Strategy  Clinical staff conducted group or individual video education with verbal and written material and guidebook.  Patient learns about the  Pritikin Eating Plan and its benefit of reducing the risk of disease. The Pritikin Eating Plan does not focus on calories. Instead, it emphasizes high-quality, nutrient-rich foods. By knowing the characteristics of the foods, we choose, we can determine their calorie density and make informed decisions.  Targeting Your Nutrition Priorities  Clinical staff conducted group or individual video education with verbal and written material and guidebook.  Patient learns that lifestyle habits have a tremendous impact on disease risk and progression. This video provides eating and physical activity recommendations based on your personal health goals, such as reducing LDL cholesterol, losing weight, preventing or controlling type 2 diabetes, and reducing high blood pressure.  Vitamins and Minerals  Clinical staff conducted group or individual video education with verbal and written material and guidebook.  Patient learns different ways to obtain key vitamins and minerals, including through a recommended healthy diet. It is important to discuss all supplements you take with your doctor.   Healthy Mind-Set    Smoking Cessation  Clinical staff conducted group or individual video education with  verbal and written material and guidebook.  Patient learns that cigarette smoking and tobacco addiction pose a serious health risk which affects millions of people. Stopping smoking will significantly reduce the risk of heart disease, lung disease, and many forms of cancer. Recommended strategies for quitting are covered, including working with your doctor to develop a successful plan.  Culinary   Becoming a Set designer conducted group or individual video education with verbal and written material and guidebook.  Patient learns that cooking at home can be healthy, cost-effective, quick, and puts them in control. Keys to cooking healthy recipes will include looking at your recipe, assessing your equipment needs, planning ahead, making it simple, choosing cost-effective seasonal ingredients, and limiting the use of added fats, salts, and sugars.  Cooking - Breakfast and Snacks  Clinical staff conducted group or individual video education with verbal and written material and guidebook.  Patient learns how important breakfast is to satiety and nutrition through the entire day. Recommendations include key foods to eat during breakfast to help stabilize blood sugar levels and to prevent overeating at meals later in the day. Planning ahead is also a key component.  Cooking - Educational psychologist conducted group or individual video education with verbal and written material and guidebook.  Patient learns eating strategies to improve overall health, including an approach to cook more at home. Recommendations include thinking of animal protein as a side on your plate rather than center stage and focusing instead on lower calorie dense options like vegetables, fruits, whole grains, and plant-based proteins, such as beans. Making sauces in large quantities to freeze for later and leaving the skin on your vegetables are also recommended to maximize your experience.  Cooking - Healthy  Salads and Dressing Clinical staff conducted group or individual video education with verbal and written material and guidebook.  Patient learns that vegetables, fruits, whole grains, and legumes are the foundations of the Pritikin Eating Plan. Recommendations include how to incorporate each of these in flavorful and healthy salads, and how to create homemade salad dressings. Proper handling of ingredients is also covered. Cooking - Soups and [employer] - Soups and Desserts Clinical staff conducted group or individual video education with verbal and written material and guidebook.  Patient learns that Pritikin soups and desserts make for easy, nutritious, and delicious snacks and meal components that are low in sodium, fat, sugar,  and calorie density, while high in vitamins, minerals, and filling fiber. Recommendations include simple and healthy ideas for soups and desserts.   Overview     The Pritikin Solution Program Overview Clinical staff conducted group or individual video education with verbal and written material and guidebook.  Patient learns that the results of the Pritikin Program have been documented in more than 100 articles published in peer-reviewed journals, and the benefits include reducing risk factors for (and, in some cases, even reversing) high cholesterol, high blood pressure, type 2 diabetes, obesity, and more! An overview of the three key pillars of the Pritikin Program will be covered: eating well, doing regular exercise, and having a healthy mind-set.  WORKSHOPS  Exercise: Exercise Basics: Building Your Action Plan Clinical staff led group instruction and group discussion with PowerPoint presentation and patient guidebook. To enhance the learning environment the use of posters, models and videos may be added. At the conclusion of this workshop, patients will comprehend the difference between physical activity and exercise, as well as the benefits of  incorporating both, into their routine. Patients will understand the FITT (Frequency, Intensity, Time, and Type) principle and how to use it to build an exercise action plan. In addition, safety concerns and other considerations for exercise and cardiac rehab will be addressed by the presenter. The purpose of this lesson is to promote a comprehensive and effective weekly exercise routine in order to improve patients' overall level of fitness.   Managing Heart Disease: Your Path to a Healthier Heart Clinical staff led group instruction and group discussion with PowerPoint presentation and patient guidebook. To enhance the learning environment the use of posters, models and videos may be added.At the conclusion of this workshop, patients will understand the anatomy and physiology of the heart. Additionally, they will understand how Pritikin's three pillars impact the risk factors, the progression, and the management of heart disease.  The purpose of this lesson is to provide a high-level overview of the heart, heart disease, and how the Pritikin lifestyle positively impacts risk factors.  Exercise Biomechanics Clinical staff led group instruction and group discussion with PowerPoint presentation and patient guidebook. To enhance the learning environment the use of posters, models and videos may be added. Patients will learn how the structural parts of their bodies function and how these functions impact their daily activities, movement, and exercise. Patients will learn how to promote a neutral spine, learn how to manage pain, and identify ways to improve their physical movement in order to promote healthy living. The purpose of this lesson is to expose patients to common physical limitations that impact physical activity. Participants will learn practical ways to adapt and manage aches and pains, and to minimize their effect on regular exercise. Patients will learn how to maintain good posture  while sitting, walking, and lifting.  Balance Training and Fall Prevention  Clinical staff led group instruction and group discussion with PowerPoint presentation and patient guidebook. To enhance the learning environment the use of posters, models and videos may be added. At the conclusion of this workshop, patients will understand the importance of their sensorimotor skills (vision, proprioception, and the vestibular system) in maintaining their ability to balance as they age. Patients will apply a variety of balancing exercises that are appropriate for their current level of function. Patients will understand the common causes for poor balance, possible solutions to these problems, and ways to modify their physical environment in order to minimize their fall risk. The purpose of this lesson  is to teach patients about the importance of maintaining balance as they age and ways to minimize their risk of falling.  WORKSHOPS   Nutrition:  Fueling a Ship broker led group instruction and group discussion with PowerPoint presentation and patient guidebook. To enhance the learning environment the use of posters, models and videos may be added. Patients will review the foundational principles of the Pritikin Eating Plan and understand what constitutes a serving size in each of the food groups. Patients will also learn Pritikin-friendly foods that are better choices when away from home and review make-ahead meal and snack options. Calorie density will be reviewed and applied to three nutrition priorities: weight maintenance, weight loss, and weight gain. The purpose of this lesson is to reinforce (in a group setting) the key concepts around what patients are recommended to eat and how to apply these guidelines when away from home by planning and selecting Pritikin-friendly options. Patients will understand how calorie density may be adjusted for different weight management goals.  Mindful  Eating  Clinical staff led group instruction and group discussion with PowerPoint presentation and patient guidebook. To enhance the learning environment the use of posters, models and videos may be added. Patients will briefly review the concepts of the Pritikin Eating Plan and the importance of low-calorie dense foods. The concept of mindful eating will be introduced as well as the importance of paying attention to internal hunger signals. Triggers for non-hunger eating and techniques for dealing with triggers will be explored. The purpose of this lesson is to provide patients with the opportunity to review the basic principles of the Pritikin Eating Plan, discuss the value of eating mindfully and how to measure internal cues of hunger and fullness using the Hunger Scale. Patients will also discuss reasons for non-hunger eating and learn strategies to use for controlling emotional eating.  Targeting Your Nutrition Priorities Clinical staff led group instruction and group discussion with PowerPoint presentation and patient guidebook. To enhance the learning environment the use of posters, models and videos may be added. Patients will learn how to determine their genetic susceptibility to disease by reviewing their family history. Patients will gain insight into the importance of diet as part of an overall healthy lifestyle in mitigating the impact of genetics and other environmental insults. The purpose of this lesson is to provide patients with the opportunity to assess their personal nutrition priorities by looking at their family history, their own health history and current risk factors. Patients will also be able to discuss ways of prioritizing and modifying the Pritikin Eating Plan for their highest risk areas  Menu  Clinical staff led group instruction and group discussion with PowerPoint presentation and patient guidebook. To enhance the learning environment the use of posters, models and videos may  be added. Using menus brought in from [employer], or printed from [employer], patients will apply the Pritikin dining out guidelines that were presented in the [employer] video. Patients will also be able to practice these guidelines in a variety of provided scenarios. The purpose of this lesson is to provide patients with the opportunity to practice hands-on learning of the Pritikin Dining Out guidelines with actual menus and practice scenarios.  Label Reading Clinical staff led group instruction and group discussion with PowerPoint presentation and patient guidebook. To enhance the learning environment the use of posters, models and videos may be added. Patients will review and discuss the Pritikin label reading guidelines presented in Pritikin's Label  Reading Educational series video. Using fool labels brought in from local grocery stores and markets, patients will apply the label reading guidelines and determine if the packaged food meet the Pritikin guidelines. The purpose of this lesson is to provide patients with the opportunity to review, discuss, and practice hands-on learning of the Pritikin Label Reading guidelines with actual packaged food labels. Cooking School  Pritikin's [employer] are designed to teach patients ways to prepare quick, simple, and affordable recipes at home. The importance of nutrition's role in chronic disease risk reduction is reflected in its emphasis in the overall Pritikin program. By learning how to prepare essential core Pritikin Eating Plan recipes, patients will increase control over what they eat; be able to customize the flavor of foods without the use of added salt, sugar, or fat; and improve the quality of the food they consume. By learning a set of core recipes which are easily assembled, quickly prepared, and affordable, patients are more likely to prepare more healthy foods at home. These workshops focus on convenient  breakfasts, simple entres, side dishes, and desserts which can be prepared with minimal effort and are consistent with nutrition recommendations for cardiovascular risk reduction. Cooking [employer] are taught by a [employer] logistics/support/administrative officer (RD) who has been trained by the [employer]. The chef or RD has a clear understanding of the importance of minimizing - if not completely eliminating - added fat, sugar, and sodium in recipes. Throughout the series of Cooking School Workshop sessions, patients will learn about healthy ingredients and efficient methods of cooking to build confidence in their capability to prepare    Cooking School weekly topics:  Adding Flavor- Sodium-Free  Fast and Healthy Breakfasts  Powerhouse Plant-Based Proteins  Satisfying Salads and Dressings  Simple Sides and Sauces  International Cuisine-Spotlight on the [employer] Zones  Delicious Desserts  Savory Soups  [employer] - Meals in a Astronomer Appetizers and Snacks  Comforting Weekend Breakfasts  One-Pot Wonders   Fast Evening Meals  Landscape architect Your Pritikin Plate  WORKSHOPS   Healthy Mindset (Psychosocial):  Focused Goals, Sustainable Changes Clinical staff led group instruction and group discussion with PowerPoint presentation and patient guidebook. To enhance the learning environment the use of posters, models and videos may be added. Patients will be able to apply effective goal setting strategies to establish at least one personal goal, and then take consistent, meaningful action toward that goal. They will learn to identify common barriers to achieving personal goals and develop strategies to overcome them. Patients will also gain an understanding of how our mind-set can impact our ability to achieve goals and the importance of cultivating a positive and growth-oriented mind-set. The purpose of this lesson is to provide patients with a deeper understanding of  how to set and achieve personal goals, as well as the tools and strategies needed to overcome common obstacles which may arise along the way.  From Head to Heart: The Power of a Healthy Outlook  Clinical staff led group instruction and group discussion with PowerPoint presentation and patient guidebook. To enhance the learning environment the use of posters, models and videos may be added. Patients will be able to recognize and describe the impact of emotions and mood on physical health. They will discover the importance of self-care and explore self-care practices which may work for them. Patients will also learn how to utilize the 4 C's to cultivate a healthier outlook and better manage stress  and challenges. The purpose of this lesson is to demonstrate to patients how a healthy outlook is an essential part of maintaining good health, especially as they continue their cardiac rehab journey.  Healthy Sleep for a Healthy Heart Clinical staff led group instruction and group discussion with PowerPoint presentation and patient guidebook. To enhance the learning environment the use of posters, models and videos may be added. At the conclusion of this workshop, patients will be able to demonstrate knowledge of the importance of sleep to overall health, well-being, and quality of life. They will understand the symptoms of, and treatments for, common sleep disorders. Patients will also be able to identify daytime and nighttime behaviors which impact sleep, and they will be able to apply these tools to help manage sleep-related challenges. The purpose of this lesson is to provide patients with a general overview of sleep and outline the importance of quality sleep. Patients will learn about a few of the most common sleep disorders. Patients will also be introduced to the concept of "sleep hygiene," and discover ways to self-manage certain sleeping problems through simple daily behavior changes. Finally, the workshop  will motivate patients by clarifying the links between quality sleep and their goals of heart-healthy living.   Recognizing and Reducing Stress Clinical staff led group instruction and group discussion with PowerPoint presentation and patient guidebook. To enhance the learning environment the use of posters, models and videos may be added. At the conclusion of this workshop, patients will be able to understand the types of stress reactions, differentiate between acute and chronic stress, and recognize the impact that chronic stress has on their health. They will also be able to apply different coping mechanisms, such as reframing negative self-talk. Patients will have the opportunity to practice a variety of stress management techniques, such as deep abdominal breathing, progressive muscle relaxation, and/or guided imagery.  The purpose of this lesson is to educate patients on the role of stress in their lives and to provide healthy techniques for coping with it.  Learning Barriers/Preferences:  Learning Barriers/Preferences - 03/14/23 0949       Learning Barriers/Preferences   Learning Barriers Hearing    Learning Preferences Written Material;Video             Education Topics:  Knowledge Questionnaire Score:  Knowledge Questionnaire Score - 03/14/23 0940       Knowledge Questionnaire Score   Pre Score 23/24             Core Components/Risk Factors/Patient Goals at Admission:  Personal Goals and Risk Factors at Admission - 03/14/23 0940       Core Components/Risk Factors/Patient Goals on Admission   Hypertension Yes    Intervention Provide education on lifestyle modifcations including regular physical activity/exercise, weight management, moderate sodium restriction and increased consumption of fresh fruit, vegetables, and low fat dairy, alcohol moderation, and smoking cessation.;Monitor prescription use compliance.    Expected Outcomes Short Term: Continued assessment and  intervention until BP is < 140/72mm HG in hypertensive participants. < 130/78mm HG in hypertensive participants with diabetes, heart failure or chronic kidney disease.;Long Term: Maintenance of blood pressure at goal levels.    Lipids Yes    Intervention Provide education and support for participant on nutrition & aerobic/resistive exercise along with prescribed medications to achieve LDL 70mg , HDL >40mg .    Expected Outcomes Short Term: Participant states understanding of desired cholesterol values and is compliant with medications prescribed. Participant is following exercise prescription and nutrition guidelines.;Long Term: Cholesterol  controlled with medications as prescribed, with individualized exercise RX and with personalized nutrition plan. Value goals: LDL < 70mg , HDL > 40 mg.    Personal Goal Other Yes    Personal Goal Increase exercise frequency and duration. Find healthy dishes. Be able to do things without becoming short of breath and having chest pain.    Intervention Yohance will attend Exercise Workshop and get individual exercise prescription to help develop an Exercise Action Plan to help increase cardiorespiratory fitness and increase exercise tolerance. He will attend Nutrition classes and cooking school to help find new heart healthy dishes.    Expected Outcomes Indalecio will comply with Exercise Action Plan and be able to do more activities for longer duration without SOB and angina as measured by self-report. He will have access to heart healthy recipies that he can prepare at home.             Core Components/Risk Factors/Patient Goals Review:    Core Components/Risk Factors/Patient Goals at Discharge (Final Review):    ITP Comments:  ITP Comments     Row Name 03/14/23 0744 04/11/23 1102         ITP Comments Medical Director- Dr. Armanda Magic, MD. Introduced Mr. Boelens to the [facility] Education Program / Intensive Cardiac Rehab. Reviewed intial orientation folder with  him. 30 Day ITP Review. Cardiac Rehab is currently on hold due to ongoing Angina requiring sublingual nitroglycerin. Richter has an appointment with the heart failure clinic on 04/12/23.               Comments: See ITP comments. Exercise is currently on hold.Thayer Headings RN BSN

## 2023-04-12 ENCOUNTER — Ambulatory Visit (HOSPITAL_COMMUNITY)
Admission: RE | Admit: 2023-04-12 | Discharge: 2023-04-12 | Disposition: A | Discharge status: Home / Self Care | Payer: Medicare Other | Source: Ambulatory Visit | Attending: Family Medicine | Admitting: Family Medicine

## 2023-04-12 ENCOUNTER — Encounter (HOSPITAL_COMMUNITY): Payer: Self-pay

## 2023-04-12 ENCOUNTER — Encounter (HOSPITAL_COMMUNITY): Payer: No Typology Code available for payment source

## 2023-04-12 VITALS — BP 114/78 | HR 73 | Wt 192.2 lb

## 2023-04-12 DIAGNOSIS — Z955 Presence of coronary angioplasty implant and graft: Secondary | ICD-10-CM | POA: Insufficient documentation

## 2023-04-12 DIAGNOSIS — I2510 Atherosclerotic heart disease of native coronary artery without angina pectoris: Secondary | ICD-10-CM

## 2023-04-12 DIAGNOSIS — I25118 Atherosclerotic heart disease of native coronary artery with other forms of angina pectoris: Secondary | ICD-10-CM | POA: Insufficient documentation

## 2023-04-12 DIAGNOSIS — R079 Chest pain, unspecified: Secondary | ICD-10-CM

## 2023-04-12 DIAGNOSIS — E119 Type 2 diabetes mellitus without complications: Secondary | ICD-10-CM | POA: Diagnosis not present

## 2023-04-12 DIAGNOSIS — Z79899 Other long term (current) drug therapy: Secondary | ICD-10-CM | POA: Insufficient documentation

## 2023-04-12 DIAGNOSIS — R0609 Other forms of dyspnea: Secondary | ICD-10-CM | POA: Diagnosis not present

## 2023-04-12 DIAGNOSIS — K219 Gastro-esophageal reflux disease without esophagitis: Secondary | ICD-10-CM | POA: Diagnosis not present

## 2023-04-12 DIAGNOSIS — I10 Essential (primary) hypertension: Secondary | ICD-10-CM | POA: Insufficient documentation

## 2023-04-12 MED ORDER — ISOSORBIDE MONONITRATE ER 60 MG PO TB24
ORAL_TABLET | ORAL | 3 refills | Status: DC
Start: 2023-04-12 — End: 2023-06-16

## 2023-04-12 NOTE — Progress Notes (Signed)
ReDS Vest / Clip - 04/12/23 1500       ReDS Vest / Clip   Station Marker D    Ruler Value 33    ReDS Value Range Low volume    ReDS Actual Value 28

## 2023-04-12 NOTE — Patient Instructions (Addendum)
Medication Changes:  INCREASE IMDUR (ISOSORBIDE) TO 60MG  IN THE MORNING AND 30MG  IN THE EVENING   Follow-Up in: KEEP FOLLOW UP AS SCHEDULED WITH DR. Gala Romney   At the [facility], you and your health needs are our priority. We have a designated team specialized in the treatment of Heart Failure. This Care Team includes your primary Heart Failure Specialized Cardiologist (physician), Advanced Practice Providers (APPs- Physician Assistants and Nurse Practitioners), and Pharmacist who all work together to provide you with the care you need, when you need it.   You may see any of the following providers on your designated Care Team at your next follow up:  Dr. Arvilla Meres Dr. Marca Ancona Dr. Marcos Eke, NP Robbie Lis, [place] [facility] [place], [place] Brynda Peon, NP Karle Plumber, PharmD   Please be sure to bring in all your medications bottles to every appointment.   Need to Contact us:  If you have any questions or concerns before your next appointment please send [place] a message through [place] or call our office at [phone].    TO LEAVE A MESSAGE FOR THE NURSE SELECT OPTION 2, PLEASE LEAVE A MESSAGE INCLUDING: YOUR NAME DATE OF BIRTH CALL BACK NUMBER REASON FOR CALL**this is important as we prioritize the call backs  YOU WILL RECEIVE A CALL BACK THE SAME DAY AS LONG AS YOU CALL BEFORE 4:00 PM

## 2023-04-12 NOTE — Addendum Note (Signed)
Encounter addended by: Jacklynn Ganong, FNP on: 04/12/2023 4:45 PM  Actions taken: Clinical Note Signed

## 2023-04-12 NOTE — Progress Notes (Addendum)
ADVANCED HF CLINIC NOTE   Primary Care: Kristian Covey, MD Primary Cardiologist: Bryan Lemma, MD HF Cardiologist: Dr. Gala Romney  HPI: Amontae is a 77 y.o. male with CAD s/p PCI. DM2, HTN, HL, GERD referred by Dr. Herbie Baltimore for further evaluation of persistent exertional CP and dyspnea of unclear etiology.   Cath 3/23 LAD ok D1 stent patent LCx 55%m RCA 40%prox 55%m RPDA stent patent  Echo 2/24 EF 55-60% G1DD RV ok  CPX testing  12/14/22 showed:  FVC 4.12 (92%)      FEV1 3.34 (101%)        FEV1/FVC 81 (108%)        MVV 151 (115%)   Resting HR: 83 Standing HR: 85 Peak HR: 127   (88% age predicted max HR)  BP rest: 130/74 BP Standing: 108/74 BP peak: 178/64  Peak VO2: 20.9 (84% predicted peak VO2)  VE/VCO2 slope:  43  OUES: 1.68  Peak RER: 1.17  Ventilatory Threshold: 16.5 (66% predicted peak VO2)  Peak RR 36  Peak Ventilation:  91.7  VE/MVV:  61%  PETCO2 at peak:  29  O2pulse:  14   (93% predicted O2pulse)   Exercise RHC showed normal pulmonary pressures at rest. PA pressures increased with exercise with no significant change in PCWP  Follow up 01/30/23 with Dr. Gala Romney, continues with CP 3x/week. SOB started 3 years ago. Discussed CR and mainstay of treatment is chronic exercise training. 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  Today he returns for an acute visit with his wife. He is having CP and SOB during CR sessions, requiring him to take SL nitroglycerin, pain usually subsides. Taking SL nitroglycerin about 1-2x/week. Frustrated that he is not tolerating CR as well as he had hoped; has completed 3 sessions. He has SOB generally with activity. Denies palpitations, dizziness, edema, or PND/Orthopnea. Appetite ok. No fever or chills. Weight stable.  Taking all medications. BP at home 110s/60s. Takes Lasix almost daily. Wife worried about pulmonary hypertension. Ramez and wife have noticed functional decline in last 2  months.   Cardiac Studies  Rest Ao = 136/75 RA = 2 RV = 31/5 PA = 32/16 (18) PCW = 11 Fick cardiac output/index = 4.9/2.4 Thermo CO/CI = 6.2/3.0  PVR = 1.2 WU FA sat = 99% PA sat = 71%, 72%   Peak exercise Ao = 164/83 PA = 61/15 (36) PCW = 17 Thermo CO/CI = 13.8/6.6 PVR = 1.4 WU  Past Medical History:  Diagnosis Date   Anemia    as infant history of   CAD S/P PTCA & DES PCI - for Progressive Angina 02/26/2019   Cath-PCI 02/26/2019: EF 55-65%. mCx 65% (FFR 0.82 - Med Rx). D2 85% - Wolverine Scoring Balloon PTCA (2.0 mm). Ost rPDA - 90% -Resolute Onyx 2.5 x 15 (2.6 mm). 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 lower back pain, s/p cervical neck surgery"stiffness" remains   Transfusion history    infant "  anemia"   Current Outpatient Medications  Medication Sig Dispense Refill   acetaminophen (TYLENOL) 500 MG tablet Take 1,000 mg by mouth every 6 (six) hours as needed for moderate pain.     Alirocumab (PRALUENT) 150 MG/ML SOAJ Inject 150 mg into the skin every 14 (fourteen) days. 2 mL 11   amLODipine (NORVASC) 5 MG tablet Take 1 tablet (5 mg total) by mouth daily. 90 tablet 3   aspirin EC 81 MG tablet Take 1 tablet (81 mg total) by mouth daily. Swallow whole. ( start after stopping clopidogrel 75 mg) 90 tablet 3   bimatoprost (LUMIGAN) 0.03 % ophthalmic solution Place 1 drop into both eyes at bedtime.     Brinzolamide-Brimonidine (SIMBRINZA) 1-0.2 % SUSP Place 1 drop into both eyes in the morning and at bedtime.     furosemide (LASIX) 20 MG tablet Take 20  mg by mouth as needed. 1-3 tabs as needed     ibuprofen (ADVIL) 200 MG tablet Take 400 mg by mouth every 6 (six) hours as needed for moderate pain.     irbesartan (AVAPRO) 150 MG tablet Take 1 tablet (150 mg total) by mouth daily. 90 tablet 3   isosorbide mononitrate (IMDUR) 60 MG 24 hr tablet Take 1 tablet (60 mg total) by mouth every morning. 90 tablet 3   metoprolol succinate (TOPROL-XL) 25 MG 24 hr tablet Take 0.5 tablets (12.5 mg total) by mouth daily. 45 tablet 3   nitroGLYCERIN (NITROSTAT) 0.4 MG SL tablet Place 1 tablet (0.4 mg total) under the tongue every 5 (five) minutes as needed for chest pain. 25 tablet 6   potassium chloride SA (KLOR-CON M) 10 MEQ tablet Take 10 mEq ( 1 capsule)  on days that you take lasix. 90 tablet 3   ranolazine (RANEXA) 500 MG 12 hr tablet Take 500 mg by mouth 2 (two) times daily.     traZODone (DESYREL) 50 MG tablet Take 50 mg by mouth at bedtime.     No current facility-administered medications for this encounter.   Allergies  Allergen Reactions   Statins     Muscle pain   Penicillins Itching    Has patient had a PCN reaction causing immediate rash, facial/tongue/throat swelling, SOB or lightheadedness with hypotension: no Has patient had a PCN reaction causing severe rash involving mucus membranes or skin necrosis: unkn Has patient had a PCN reaction that required hospitalization: no Has patient had a PCN reaction occurring within the last 10 years: no If all of the above answers are "NO", then may proceed with Cephalosporin use.  Tolerated Cephalosporin Date: 10/01/20.   Social History   Socioeconomic History   Marital status: Married    Spouse name: Not on file   Number of children: Not on file   Years of education: 16   Highest education level: Bachelor's degree (e.g., BA, AB, BS)  Occupational History   Not on file  Tobacco Use   Smoking status: Former    Current packs/day: 0.00    Average packs/day: 1 pack/day for 5.0 years (5.0 ttl  pk-yrs)    Types: Cigarettes    Start date: 10/18/1963    Quit date: 10/17/1968    Years since quitting: 54.5   Smokeless tobacco: Never  Vaping Use   Vaping status: Never Used  Substance and Sexual Activity   Alcohol use: Not Currently    Comment: "quit drinking ~ 10/1968   Drug use: No   Sexual activity: Not on file  Other Topics Concern  Not on file  Social History Narrative   Not on file   Social Determinants of Health   Financial Resource Strain: Low Risk  (08/22/2022)   Overall Financial Resource Strain (CARDIA)    Difficulty of Paying Living Expenses: Not very hard  Food Insecurity: No Food Insecurity (08/22/2022)   Hunger Vital Sign    Worried About Running Out of Food in the Last Year: Never true    Ran Out of Food in the Last Year: Never true  Transportation Needs: No Transportation Needs (08/22/2022)   PRAPARE - Administrator, Civil Service (Medical): No    Lack of Transportation (Non-Medical): No  Physical Activity: Inactive (05/16/2022)   Exercise Vital Sign    Days of Exercise per Week: 0 days    Minutes of Exercise per Session: 0 min  Stress: No Stress Concern Present (08/22/2022)   [employer] of Occupational Health - Occupational Stress Questionnaire    Feeling of Stress : Not at all  Social Connections: Moderately Integrated (08/22/2022)   Social Connection and Isolation Panel [NHANES]    Frequency of Communication with Friends and Family: More than three times a week    Frequency of Social Gatherings with Friends and Family: More than three times a week    Attends Religious Services: Never    Database administrator or Organizations: Yes    Attends Banker Meetings: Never    Marital Status: Married  Catering manager Violence: Not At Risk (05/07/2021)   Humiliation, Afraid, Rape, and Kick questionnaire    Fear of Current or Ex-Partner: No    Emotionally Abused: No    Physically Abused: No    Sexually Abused: No   Family History   Problem Relation Age of Onset   Hypertension Mother    Hypertension Father    Arthritis Sister        rheumatoid   Stroke Sister 60   BP 114/78   Pulse 73   Wt 87.2 kg (192 lb 3.2 oz)   SpO2 96%   BMI 25.53 kg/m   Wt Readings from Last 3 Encounters:  04/12/23 87.2 kg (192 lb 3.2 oz)  03/14/23 87.1 kg (192 lb 0.3 oz)  01/30/23 85.9 kg (189 lb 6.4 oz)   PHYSICAL EXAM: General:  NAD. No resp difficulty, appears younger than stated age HEENT: Normal Neck: Supple. No JVD. Carotids 2+ bilat; no bruits. No lymphadenopathy or thryomegaly appreciated. Cor: PMI nondisplaced. Regular rate & rhythm. No rubs, gallops or murmurs. Lungs: Clear Abdomen: Soft, nontender, nondistended. No hepatosplenomegaly. No bruits or masses. Good bowel sounds. Extremities: No cyanosis, clubbing, rash, edema Neuro: Alert & oriented x 3, cranial nerves grossly intact. Moves all 4 extremities w/o difficulty. Affect pleasant.  ECG (personally reviewed): NSR 76 bpm, no acute ST-T changes  ReDs: 28%  ASSESSMENT & PLAN:  1. Exertional CP and dyspnea - CPX testing shows relatively well preserved functional capacity but high VE/VCO2 slope suggestive of elevated pulmonary pressures with exercise +/- end-exercise hyperventilation - exercise RHC shows normal hemodynamics at rest but significant elevation in PA pressures with exercise with a normal PCWP. Dr. Gala Romney previously explained to Shazil and his wife that moderate increases in PA pressures during exercise is not an uncommon in elderly subjects and may represent "normal" physiology however, this is almost certainly contributing to his exertional symptoms. Unfortunately there is no direct therapy for this except chronic exercise training. I discussed this again with Benen and his wife today. -  His dyspnea and CP sound like his typical pattern. - Recommend he stick with CR, also encouraged him to walk regularly with walking intervals; OK to stop and take  nitroglycerin if he is symptomatic with exertion. - Increase Imdur to 60 am/30 pm - Dr Gala Romney to see in 3 months to re-assess.  2. CAD - Cath as above - Seems to have a component of chronic stable angina which may be related to microvascular disease vs. elevated pulmonary pressures as above - As above, Increase Imdur - Continue CR. Stressed importance of continuing monitored exercise program.  Follow up with Dr. Gala Romney, as scheduled.   Jacklynn Ganong, FNP  3:13 PM

## 2023-04-14 ENCOUNTER — Encounter (HOSPITAL_COMMUNITY)
Admission: RE | Admit: 2023-04-14 | Discharge: 2023-04-14 | Disposition: A | Discharge status: Home / Self Care | Payer: No Typology Code available for payment source | Source: Ambulatory Visit | Attending: Internal Medicine | Admitting: Internal Medicine

## 2023-04-14 DIAGNOSIS — I2089 Other forms of angina pectoris: Secondary | ICD-10-CM | POA: Diagnosis not present

## 2023-04-14 NOTE — Progress Notes (Signed)
Returned to exercise at cardiac rehab per [facility] NP. Medication changes noted. Report having continued chest pressure has not taken any sublingual nitroglycerin since last week.Thayer Headings RN BSN

## 2023-04-18 ENCOUNTER — Encounter (HOSPITAL_COMMUNITY): Payer: Medicare Other

## 2023-04-19 ENCOUNTER — Encounter (HOSPITAL_COMMUNITY)
Admission: RE | Admit: 2023-04-19 | Discharge: 2023-04-19 | Disposition: A | Discharge status: Home / Self Care | Payer: No Typology Code available for payment source | Source: Ambulatory Visit | Attending: Internal Medicine | Admitting: Internal Medicine

## 2023-04-19 DIAGNOSIS — I2089 Other forms of angina pectoris: Secondary | ICD-10-CM | POA: Insufficient documentation

## 2023-04-19 DIAGNOSIS — R079 Chest pain, unspecified: Secondary | ICD-10-CM

## 2023-04-21 ENCOUNTER — Encounter (HOSPITAL_COMMUNITY)
Admission: RE | Admit: 2023-04-21 | Discharge: 2023-04-21 | Disposition: A | Discharge status: Home / Self Care | Payer: No Typology Code available for payment source | Source: Ambulatory Visit | Attending: Internal Medicine | Admitting: Internal Medicine

## 2023-04-21 DIAGNOSIS — R079 Chest pain, unspecified: Secondary | ICD-10-CM

## 2023-04-21 DIAGNOSIS — I2089 Other forms of angina pectoris: Secondary | ICD-10-CM | POA: Diagnosis not present

## 2023-04-24 ENCOUNTER — Encounter (HOSPITAL_COMMUNITY): Payer: No Typology Code available for payment source

## 2023-04-26 ENCOUNTER — Encounter (HOSPITAL_COMMUNITY)
Admission: RE | Admit: 2023-04-26 | Discharge: 2023-04-26 | Disposition: A | Discharge status: Home / Self Care | Payer: No Typology Code available for payment source | Source: Ambulatory Visit | Attending: Internal Medicine

## 2023-04-26 DIAGNOSIS — R079 Chest pain, unspecified: Secondary | ICD-10-CM

## 2023-04-26 DIAGNOSIS — I2089 Other forms of angina pectoris: Secondary | ICD-10-CM

## 2023-04-26 NOTE — Progress Notes (Signed)
Reviewed home exercise Rx with patient today.  Encouraged warm-up, cool-down, and stretching. Reviewed THRR of 58 - 115 and keeping RPE between 11-13. Encouraged to hydrate with activity.  Reviewed weather parameters for temperature and humidity for safe exercise outdoors. Reviewed S/S to terminate exercise and when to call 911 vs MD. Reviewed the use of NTG and pt was encouraged to carry at all times. Pt encouraged to always carry a cell phone for safety when exercising outdoors. Pt verbalized understanding of the home exercise Rx and was provided a copy.   Lesly Rubenstein MS, ACSM-CEP, CCRP

## 2023-04-27 ENCOUNTER — Other Ambulatory Visit (HOSPITAL_BASED_OUTPATIENT_CLINIC_OR_DEPARTMENT_OTHER): Payer: Self-pay

## 2023-04-27 MED ORDER — FLUAD 0.5 ML IM SUSY
0.5000 mL | PREFILLED_SYRINGE | Freq: Once | INTRAMUSCULAR | 0 refills | Status: AC
  Filled 2023-04-27: qty 0.5, 1d supply, fill #0

## 2023-04-28 ENCOUNTER — Encounter (HOSPITAL_COMMUNITY)
Admission: RE | Admit: 2023-04-28 | Discharge: 2023-04-28 | Disposition: A | Discharge status: Home / Self Care | Payer: No Typology Code available for payment source | Source: Ambulatory Visit | Attending: Internal Medicine | Admitting: Internal Medicine

## 2023-04-28 DIAGNOSIS — I2089 Other forms of angina pectoris: Secondary | ICD-10-CM | POA: Diagnosis not present

## 2023-04-28 DIAGNOSIS — R079 Chest pain, unspecified: Secondary | ICD-10-CM

## 2023-05-01 ENCOUNTER — Telehealth: Payer: Self-pay | Admitting: *Deleted

## 2023-05-01 ENCOUNTER — Encounter (HOSPITAL_COMMUNITY)
Admission: RE | Admit: 2023-05-01 | Discharge: 2023-05-01 | Disposition: A | Discharge status: Home / Self Care | Payer: No Typology Code available for payment source | Source: Ambulatory Visit | Attending: Internal Medicine | Admitting: Internal Medicine

## 2023-05-01 DIAGNOSIS — I2510 Atherosclerotic heart disease of native coronary artery without angina pectoris: Secondary | ICD-10-CM

## 2023-05-01 DIAGNOSIS — I2089 Other forms of angina pectoris: Secondary | ICD-10-CM

## 2023-05-01 DIAGNOSIS — R079 Chest pain, unspecified: Secondary | ICD-10-CM

## 2023-05-01 MED ORDER — NITROGLYCERIN 0.4 MG SL SUBL: 0.4000 mg | SUBLINGUAL_TABLET | SUBLINGUAL | Status: DC | PRN

## 2023-05-01 NOTE — Telephone Encounter (Signed)
this gentleman is having cp going into his neck getting 12 lead on 02 and going to give him a nitro  MW he is in the treatment room  9:16 AM TC Sharlene Dory, PA-C I dont think we should let him go without chest pain resolving  9:33 AM Rip Harbour, NP was added by Sharlene Dory, PA-C. 9:33 AM MW Cammy Copa, RN does he need to go to the ED?  9:33 AM TC Sharlene Dory, PA-C if he thinks its what he has been dealing with no  59 mins Marykay Lex, MD was added by Cammy Copa, RN. 59 mins TC Sharlene Dory, PA-C looks like his chest pain is chronic  59 mins MW Cammy Copa, RN Reports having increased chest pressure and shortness of breath at cardiac rehab radiating into his jaw.Erdi rates the discomfort a 5-6 on a 1-10 scale.Patient taken into the treatment room. Placed on oxygen at 2l/min. Patient given a sublingual nitroglycerin. 12 lead ECG obtained. Jari Favre PAC on site provider came in to assess and talk to the patient. Blood pressure 126/62. Telemetry rhythm Sinus rate 65. Tessa told Rolin to increase his Isosorbide to 90 mg in the morning and 30 mg at night. 3244. Shaquiel reports his chest pressure has improved significantly.  59 mins DH Marykay Lex, MD - - we can order a Stress Pet to assess. Has chronic CP issues - caths have been OK

## 2023-05-01 NOTE — Progress Notes (Signed)
Reports having increased chest pressure and shortness of breath at cardiac rehab radiating into his jaw.Luis Guzman rates the discomfort a 5-6 on a 1-10 scale.Patient taken into the treatment room. Placed on oxygen at 2l/min. Patient given a sublingual nitroglycerin. 12 lead ECG obtained. Luis Guzman PAC on site provider came in to assess and talk to the patient. Reviewed 12 lead ECG. Blood pressure 126/62. Telemetry rhythm Sinus rate 65. Tessa told Luis Guzman to increase his Isosorbide to 90 mg in the morning and 30 mg at night. 6213. Luis Guzman reports his chest pressure has improved significantly. Dr Herbie Baltimore also notified and recommends stress test and office follow up. Luis Guzman says that now his pain is resolved. Will hold off on further exercise until stress test is completed and Luis Guzman is seen in the office per Dr Herbie Baltimore. Patient aware of medication changes and know to the ED if his chest pain returns and does not resolve with Nitroglycerin. Oxygen discontinued . Exit BP 122/70 heart rate 59. Oxygen saturation 100% on room air. Okay to discharge from cardiac rehab. Dr Elissa Hefty office will contact Luis Guzman with follow up testing and office follow up.  Thayer Headings RN BSN

## 2023-05-01 NOTE — Telephone Encounter (Signed)
Order placed  for  Cardiac PET SCAN

## 2023-05-03 ENCOUNTER — Telehealth (HOSPITAL_COMMUNITY): Payer: Self-pay

## 2023-05-03 ENCOUNTER — Encounter (HOSPITAL_COMMUNITY): Payer: No Typology Code available for payment source

## 2023-05-03 ENCOUNTER — Telehealth: Payer: Self-pay | Admitting: Cardiology

## 2023-05-03 NOTE — Telephone Encounter (Signed)
  [facility]ED [facility] FAILURE CLINIC   Pre-operative Risk Assessment     Request for Surgical Clearance    Procedure:   Cataract RIGHT EYE- w/Goniotomy  Date of Surgery:  Clearance 06/22/23                                 Surgeon:  Dr. Carolin Coy Group or Practice Name:  [facility] Phone number:  [phone] Fax number:  [phone]   Type of Clearance Requested:   - Medical  - Pharmacy:  Hold Aspirin HOW LONG IS SAFE?    Type of Anesthesia:  MAC   Additional requests/questions:  Please fax a copy of CLEARANCE WITH RECOMMENDATIONS to the surgeon's office.  Signed, Baird Cancer  BSN, RN 05/03/2023, 3:42 PM   [facility]

## 2023-05-03 NOTE — Telephone Encounter (Signed)
Patient is calling because he is leaving the country on Oct 4th and will be gone for a week. Patient would like to know if it is okay for him to leave for a week. Please advise.

## 2023-05-03 NOTE — Telephone Encounter (Signed)
Patient states he is going on a cruise Oct 4th and wanted to know do you think its ok for him to go for a week.

## 2023-05-04 NOTE — Telephone Encounter (Signed)
Name: Luis Guzman  DOB: February 20, 1946  MRN: [id number]   Primary Cardiologist: Bryan Lemma, MD  Chart reviewed as part of pre-operative protocol coverage. Luis Guzman was last seen on 04/12/23 by Prince Rome, FNP.    Per correspondence with Prince Rome, FNP on 05/03/23 patient is "Stable for procedure from cardiac perspective."   Ideally patient would continue aspirin throughout perioperative period, if surgeon feels bleeding risk is too high the patient can hold aspirin 5 to 7 days prior to procedure. Please resume aspirin when felt safe to medically do so.   I will route this recommendation to the requesting party via Epic fax function and remove from pre-op pool. Please call with questions.  Rip Harbour, NP 05/04/2023, 10:29 AM

## 2023-05-04 NOTE — Telephone Encounter (Signed)
I think it is fine for him to go.

## 2023-05-04 NOTE — Telephone Encounter (Signed)
Patient is aware provider recommendations. Verbalized understanding

## 2023-05-05 ENCOUNTER — Encounter (HOSPITAL_COMMUNITY): Payer: No Typology Code available for payment source

## 2023-05-08 ENCOUNTER — Encounter (HOSPITAL_COMMUNITY): Payer: No Typology Code available for payment source

## 2023-05-09 NOTE — Telephone Encounter (Signed)
  Shared Decision Making/Informed Consent The risks [chest pain, shortness of breath, cardiac arrhythmias, dizziness, blood pressure fluctuations, myocardial infarction, stroke/transient ischemic attack, nausea, vomiting, allergic reaction, radiation exposure, metallic taste sensation and life-threatening complications (estimated to be 1 in 10,000)], benefits (risk stratification, diagnosing coronary artery disease, treatment guidance) and alternatives of a cardiac PET stress test were discussed in detail with Mr. Utrera and he agrees to proceed.   Bryan Lemma, MD

## 2023-05-09 NOTE — Progress Notes (Signed)
Cardiac Individual Treatment Plan  Patient Details  Name: Luis Guzman MRN: [id number] Date of Birth: Apr 01, 1946 Referring Provider:   Flowsheet Row INTENSIVE CARDIAC REHAB ORIENT from 03/14/2023 in [facility]  Referring Provider Bensimhon, Bevelyn Buckles, MD       Initial Encounter Date:  Flowsheet Row INTENSIVE CARDIAC REHAB ORIENT from 03/14/2023 in [facility]  Date 03/14/23       Visit Diagnosis: Chronic stable angina  Chest pain, unspecified type - Plan: EKG - 12 lead, EKG - 12 lead  Patient's Home Medications on Admission:  Current Outpatient Medications:    amLODipine (NORVASC) 5 MG tablet, Take 1 tablet (5 mg total) by mouth daily., Disp: 90 tablet, Rfl: 3   aspirin EC 81 MG tablet, Take 1 tablet (81 mg total) by mouth daily. Swallow whole. ( start after stopping clopidogrel 75 mg), Disp: 90 tablet, Rfl: 3   irbesartan (AVAPRO) 150 MG tablet, Take 1 tablet (150 mg total) by mouth daily., Disp: 90 tablet, Rfl: 3   isosorbide mononitrate (IMDUR) 60 MG 24 hr tablet, TAKE 1 FULL TABLET (60MG ) IN THE MORNING, AND HALF A TABLET (30MG ) IN THE EVENING (Patient taking differently: 60 mg. TAKE 1 FULL TABLET (90 MG) IN THE MORNING, AND HALF A TABLET (30MG ) IN THE EVENING per Jari Favre [facility] 05/01/23), Disp: 45 tablet, Rfl: 3   metoprolol succinate (TOPROL-XL) 25 MG 24 hr tablet, Take 0.5 tablets (12.5 mg total) by mouth daily., Disp: 45 tablet, Rfl: 3   nitroGLYCERIN (NITROSTAT) 0.4 MG SL tablet, Place 1 tablet (0.4 mg total) under the tongue every 5 (five) minutes as needed for chest pain., Disp: 25 tablet, Rfl: 6   ranolazine (RANEXA) 500 MG 12 hr tablet, Take 500 mg by mouth 2 (two) times daily., Disp: , Rfl:    acetaminophen (TYLENOL) 500 MG tablet, Take 1,000 mg by mouth every 6 (six) hours as needed for moderate pain., Disp: , Rfl:    Alirocumab (PRALUENT) 150 MG/ML SOAJ,  Inject 150 mg into the skin every 14 (fourteen) days., Disp: 2 mL, Rfl: 11   bimatoprost (LUMIGAN) 0.03 % ophthalmic solution, Place 1 drop into both eyes at bedtime., Disp: , Rfl:    Brinzolamide-Brimonidine (SIMBRINZA) 1-0.2 % SUSP, Place 1 drop into both eyes in the morning and at bedtime., Disp: , Rfl:    furosemide (LASIX) 20 MG tablet, Take 20 mg by mouth as needed. 1-3 tabs as needed, Disp: , Rfl:    ibuprofen (ADVIL) 200 MG tablet, Take 400 mg by mouth every 6 (six) hours as needed for moderate pain., Disp: , Rfl:    potassium chloride SA (KLOR-CON M) 10 MEQ tablet, Take 10 mEq ( 1 capsule)  on days that you take lasix., Disp: 90 tablet, Rfl: 3   traZODone (DESYREL) 50 MG tablet, Take 50 mg by mouth at bedtime., Disp: , Rfl:   Past Medical History: Past Medical History:  Diagnosis Date   Anemia    as infant history of   CAD S/P PTCA & DES PCI - for Progressive Angina 02/26/2019   Cath-PCI 02/26/2019: EF 55-65%. mCx 65% (FFR 0.82 - Med Rx). D2 85% - [employer] Scoring Balloon PTCA (2.0 mm). Ost rPDA - 90% -Resolute Onyx 2.5 x 15 (2.6 mm). 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 lower back pain, s/p cervical neck surgery"stiffness" remains   Transfusion history    infant "anemia"    Tobacco Use: Social History   Tobacco Use  Smoking Status Former   Current packs/day: 0.00   Average packs/day: 1 pack/day for 5.0 years (5.0 ttl pk-yrs)   Types: Cigarettes   Start date: 10/18/1963   Quit date: 10/17/1968   Years since quitting: 54.5  Smokeless Tobacco Never     Labs: Review Flowsheet  More data exists      Latest Ref Rng & Units 05/18/2021 07/02/2021 05/20/2022 11/14/2022 01/26/2023  Labs for ITP Cardiac and Pulmonary Rehab  Cholestrol 100 - 199 mg/dL 161  096  045  409  -  LDL (calc) 0 - 99 mg/dL 46  63  73  55  -  HDL-C >39 mg/dL 81.19  46  14.78  44  -  Trlycerides 0 - 149 mg/dL 29.5  64  621.3  086  -  Hemoglobin A1c 4.6 - 6.5 % 6.0  - 5.9  - -  Bicarbonate 20.0 - 28.0 mmol/L 20.0 - 28.0 mmol/L - - - - 22.1  23.9   TCO2 22 - 32 mmol/L 22 - 32 mmol/L - - - - 23  25   Acid-base deficit 0.0 - 2.0 mmol/L 0.0 - 2.0 mmol/L - - - - 3.0  1.0   O2 Saturation % % - - - - 72  71     Details       Multiple values from one day are sorted in reverse-chronological order         Capillary Blood Glucose: Lab Results  Component Value Date   GLUCAP 95 03/04/2020     Exercise Target Goals: Exercise Program Goal: Individual exercise prescription set using results from initial 6 min walk test and THRR while considering  patient's activity barriers and safety.   Exercise Prescription Goal: Initial exercise prescription builds to 30-45 minutes a day of aerobic activity, 2-3 days per week.  Home exercise guidelines will be given to patient during program as part of exercise prescription that the participant will acknowledge.  Activity Barriers & Risk Stratification:  Activity Barriers & Cardiac Risk Stratification - 03/14/23 0836       Activity Barriers & Cardiac Risk Stratification   Activity Barriers Left Hip Replacement;Right Hip Replacement;Neck/Spine Problems;Other (comment);Shortness of Breath;Chest Pain/Angina    Comments Two artificial discs in neck. Left leg fracture in 2010, screws, pins, rod from knee to ankle.    Cardiac Risk Stratification High             6 Minute Walk:  6 Minute Walk     Row Name 03/14/23 0937         6 Minute Walk   Phase Initial     Distance 1511 feet     Walk Time 6 minutes     # of Rest  Breaks 0     MPH 2.86     METS 2.94     RPE 11     Perceived Dyspnea  1     VO2  Peak 10.28     Symptoms Yes (comment)     Comments Mild shortness of breath     Resting HR 76 bpm     Resting BP 110/60     Resting Oxygen Saturation  97 %     Exercise Oxygen Saturation  during 6 min walk 95 %     Max Ex. HR 91 bpm     Max Ex. BP 110/70     2 Minute Post BP 102/62              Oxygen Initial Assessment:   Oxygen Re-Evaluation:   Oxygen Discharge (Final Oxygen Re-Evaluation):   Initial Exercise Prescription:  Initial Exercise Prescription - 03/14/23 0900       Date of Initial Exercise RX and Referring Provider   Date 03/14/23    Referring Provider [name], MD    Expected Discharge Date 06/07/23      Recumbant Bike   Level 2    Watts 26    Minutes 15    METs 2.9      Recumbant Elliptical   Level 1    Minutes 15    METs 2.9      Prescription Details   Frequency (times per week) 3    Duration Progress to 30 minutes of continuous aerobic without signs/symptoms of physical distress      Intensity   THRR 40-80% of Max Heartrate 58-115    Ratings of Perceived Exertion 11-13    Perceived Dyspnea 0-4      Progression   Progression Continue to progress workloads to maintain intensity without signs/symptoms of physical distress.      Resistance Training   Training Prescription Yes    Weight 4 lbs    Reps 10-15             Perform Capillary Blood Glucose checks as needed.  Exercise Prescription Changes:   Exercise Prescription Changes     Row Name 03/24/23 1000 04/14/23 1200 04/21/23 1100 04/26/23 1400       Response to Exercise   Blood Pressure (Admit) 94/58 103/69 110/60 120/70    Blood Pressure (Exercise) 140/68 138/70 136/70 158/82    Blood Pressure (Exit) 104/62 104/60 104/66 114/70    Heart Rate (Admit) 70 bpm 70 bpm 60 bpm 64 bpm    Heart Rate (Exercise) 122 bpm 112 bpm 109 bpm 117 bpm    Heart Rate (Exit) 80 bpm 79 bpm 73 bpm  80 bpm    Rating of Perceived Exertion (Exercise) 13 12 12 13     Symptoms None Chest pressure None None    Comments Pt's first day in the [facility] program Reviewed METs Reviewed goals Reviewed METs/Home exercise Rx    Duration Progress to 30 minutes of  aerobic without signs/symptoms of physical distress Continue with 30 min of aerobic exercise without signs/symptoms of physical distress. Continue with 30 min of aerobic exercise without signs/symptoms of physical distress. Continue with 30 min of aerobic exercise without signs/symptoms of physical distress.    Intensity THRR unchanged THRR unchanged Other (comment) THRR unchanged      Progression   Progression Continue to progress workloads to maintain intensity without signs/symptoms of physical distress. Continue to progress workloads to maintain intensity without signs/symptoms of physical distress. Continue to progress workloads to maintain intensity without signs/symptoms of physical distress. Continue to progress workloads to maintain intensity without signs/symptoms of physical distress.    Average METs 3.25 3.45 3.8 3.05  Resistance Training   Training Prescription Yes Yes Yes No    Weight 4 lbs 4 lbs 4 lbs No weights on wednesdays    Reps 10-15 10-15 10-15 --    Time 10 Minutes 10 Minutes 10 Minutes --      Interval Training   Interval Training No No No No      Bike   Level -- -- -- --    Watts -- -- -- --    Minutes -- -- -- --    METs -- -- -- --      Recumbant Bike   Level 2 2 2 2     RPM 76 -- -- --    Watts 23 -- 23 --    Minutes 15 15 15 15     METs 2.4 2.4 2.4 2.5      Recumbant Elliptical   Level 1 1 1 1     RPM 68 71 80 80    Watts 96 105 122 117    Minutes 15 15 15 15     METs 4.1 4.5 5.2 5      Home Exercise Plan   Plans to continue exercise at -- -- -- Home (comment)    Frequency -- -- -- Add 3 additional days to program exercise sessions.    Initial Home Exercises Provided -- -- -- 04/26/23              Exercise Comments:   Exercise Comments     Row Name 03/24/23 1042 04/07/23 0825 04/14/23 1208 04/26/23 1122     Exercise Comments Pt's first day in the [facility] program. Pt tolerated exercise session well with no complaints. Pt due for MET review but is out pending medical clearance. Will complete upon return. Reviewed METs. Pt on to return to CR. Pt is progressing. Instructed to decrease workload/intensity if have sob/pressure. Reviewed METs and Home exercise Rx. Pt is progressing on workloads. Pt is walking at home 3x/week for 20-25 minutes. Pt verbalized understanding of the home exericse Rx and was provided a copy.             Exercise Goals and Review:   Exercise Goals     Row Name 03/14/23 0756             Exercise Goals   Increase Physical Activity Yes       Intervention Provide advice, education, support and counseling about physical activity/exercise needs.;Develop an individualized exercise prescription for aerobic and resistive training based on initial evaluation findings, risk stratification, comorbidities and participant's personal goals.       Expected Outcomes Short Term: Attend rehab on a regular basis to increase amount of physical activity.;Long Term: Exercising regularly at least 3-5 days a week.;Long Term: Add in home exercise to make exercise part of routine and to increase amount of physical activity.       Increase Strength and Stamina Yes       Intervention Provide advice, education, support and counseling about physical activity/exercise needs.;Develop an individualized exercise prescription for aerobic and resistive training based on initial evaluation findings, risk stratification, comorbidities and participant's personal goals.       Expected Outcomes Short Term: Increase workloads from initial exercise prescription for resistance, speed, and METs.;Short Term: Perform resistance training exercises routinely during rehab and add in resistance training at  home;Long Term: Improve cardiorespiratory fitness, muscular endurance and strength as measured by increased METs and functional capacity ( )       Able to understand  and use rate of perceived exertion (RPE) scale Yes       Intervention Provide education and explanation on how to use RPE scale       Expected Outcomes Short Term: Able to use RPE daily in rehab to express subjective intensity level;Long Term:  Able to use RPE to guide intensity level when exercising independently       Knowledge and understanding of Target Heart Rate Range (THRR) Yes       Intervention Provide education and explanation of THRR including how the numbers were predicted and where they are located for reference       Expected Outcomes Short Term: Able to state/look up THRR;Long Term: Able to use THRR to govern intensity when exercising independently;Short Term: Able to use daily as guideline for intensity in rehab       Able to check pulse independently Yes       Intervention Provide education and demonstration on how to check pulse in carotid and radial arteries.;Review the importance of being able to check your own pulse for safety during independent exercise       Expected Outcomes Short Term: Able to explain why pulse checking is important during independent exercise;Long Term: Able to check pulse independently and accurately       Understanding of Exercise Prescription Yes       Intervention Provide education, explanation, and written materials on patient's individual exercise prescription       Expected Outcomes Short Term: Able to explain program exercise prescription;Long Term: Able to explain home exercise prescription to exercise independently                Exercise Goals Re-Evaluation :  Exercise Goals Re-Evaluation     Row Name 03/24/23 1040 04/21/23 1104           Exercise Goal Re-Evaluation   Exercise Goals Review Increase Physical Activity;Increase Strength and Stamina;Able to understand and  use rate of perceived exertion (RPE) scale;Knowledge and understanding of Target Heart Rate Range (THRR);Understanding of Exercise Prescription Increase Physical Activity;Increase Strength and Stamina;Able to understand and use rate of perceived exertion (RPE) scale;Knowledge and understanding of Target Heart Rate Range (THRR);Understanding of Exercise Prescription      Comments Pt's first day in the CRP2 program. Pt understands the THRR, RPE scale and exercise Rx. Reviewed goals. Pt has made progress on his goal of increased exercise time and frequency. He is walking at home 2xweek for 25 minutes with his wife. For pt's goal of less SOB and angina, it is imrpoving since the MD increased his medication.      Expected Outcomes Will continue to increase workloads as tolerated. Will continue to increase workloads as tolerated.               Discharge Exercise Prescription (Final Exercise Prescription Changes):  Exercise Prescription Changes - 04/26/23 1400       Response to Exercise   Blood Pressure (Admit) 120/70    Blood Pressure (Exercise) 158/82    Blood Pressure (Exit) 114/70    Heart Rate (Admit) 64 bpm    Heart Rate (Exercise) 117 bpm    Heart Rate (Exit) 80 bpm    Rating of Perceived Exertion (Exercise) 13    Symptoms None    Comments Reviewed METs/Home exercise Rx    Duration Continue with 30 min of aerobic exercise without signs/symptoms of physical distress.    Intensity THRR unchanged      Progression   Progression Continue  to progress workloads to maintain intensity without signs/symptoms of physical distress.    Average METs 3.05      Resistance Training   Training Prescription No    Weight No weights on wednesdays      Interval Training   Interval Training No      Bike   Level --    Watts --    Minutes --    METs --      Recumbant Bike   Level 2    Minutes 15    METs 2.5      Recumbant Elliptical   Level 1    RPM 80    Watts 117    Minutes 15    METs  5      Home Exercise Plan   Plans to continue exercise at Home (comment)    Frequency Add 3 additional days to program exercise sessions.    Initial Home Exercises Provided 04/26/23             Nutrition:  Target Goals: Understanding of nutrition guidelines, daily intake of sodium 1500mg , cholesterol 200mg , calories 30% from fat and 7% or less from saturated fats, daily to have 5 or more servings of fruits and vegetables.  Biometrics:  Pre Biometrics - 03/14/23 0744       Pre Biometrics   Waist Circumference 39.75 inches    Hip Circumference 39.25 inches    Waist to Hip Ratio 1.01 %    Triceps Skinfold 8 mm    % Body Fat 24 %    Grip Strength 36 kg    Flexibility 11.25 in    Single Leg Stand 30 seconds              Nutrition Therapy Plan and Nutrition Goals:  Nutrition Therapy & Goals - 04/24/23 0935       Nutrition Therapy   Diet Heart Healthy Diet      Personal Nutrition Goals   Nutrition Goal Patient to identify strategies for reducing cardiovascular risk by attending the [employer] education and nutrition series weekly.   goal in progress.   Personal Goal #2 Patient to improve diet quality by using the plate method as a guide for meal planning to include lean protein/plant protein, fruits, vegetables, whole grains, nonfat dairy as part of a well-balanced diet.   goal in progress.   Personal Goal #3 Patient to limit sodium intake to 1500mg  per day   goal in progress.   Comments Goals in progress. Luis Guzman continues to attend the [employer] and nutrition series. He has better understanding of fiber recommendations, strategies for reducing saturated fat, and reading food labels for sodium and sugar. He has maintained his weight since starting with our program. No new labs at this time; his A1c remains in a prediabetic range. His wife is supportive of lifestyle changes. Patient will benefit from participation in intensive cardiac rehab for nutrition, exercise,  and lifestyle modification.      Intervention Plan   Intervention Prescribe, educate and counsel regarding individualized specific dietary modifications aiming towards targeted core components such as weight, hypertension, lipid management, diabetes, heart failure and other comorbidities.;Nutrition handout(s) given to patient.    Expected Outcomes Short Term Goal: Understand basic principles of dietary content, such as calories, fat, sodium, cholesterol and nutrients.;Long Term Goal: Adherence to prescribed nutrition plan.             Nutrition Assessments:  MEDIFICTS Score Key: >=70 Need to make dietary changes  40-70 Heart Healthy Diet <= 40 Therapeutic Level Cholesterol Diet    Picture Your Plate Scores: <62 Unhealthy dietary pattern with much room for improvement. 41-50 Dietary pattern unlikely to meet recommendations for good health and room for improvement. 51-60 More healthful dietary pattern, with some room for improvement.  >60 Healthy dietary pattern, although there may be some specific behaviors that could be improved.    Nutrition Goals Re-Evaluation:  Nutrition Goals Re-Evaluation     Row Name 03/24/23 1400 04/24/23 0935           Goals   Current Weight 192 lb 7.4 oz (87.3 kg) 191 lb 5.8 oz (86.8 kg)      Comment per labs through [place] on 11/24/22: triglycerides 162, LDL 67 (goal <70)- patient is statin intolerant, A1c 6.1 No new labs; per labs through [place] on 11/24/22: triglycerides 162, LDL 67 (goal <70)- patient is statin intolerant, A1c 6.1      Expected Outcome Luis Guzman reports motivation to begin the [place] Diet. He typically eats 1-2 meals per day and he does the majority of cooking at home. His wife is supportive of lifestyle changes. Patient will benefit from participation in intensive cardiac rehab for nutrition, exercise, and lifestyle modification. Goals in progress. Luis Guzman continues to attend the [employer] and nutrition series. He has better  understanding of fiber recommendations, strategies for reducing saturated fat, and reading food labels for sodium and sugar. He has maintained his weight since starting with our program. No new labs at this time; his A1c remains in a prediabetic range. His wife is supportive of lifestyle changes. Patient will benefit from participation in intensive cardiac rehab for nutrition, exercise, and lifestyle modification.               Nutrition Goals Re-Evaluation:  Nutrition Goals Re-Evaluation     Row Name 03/24/23 1400 04/24/23 0935           Goals   Current Weight 192 lb 7.4 oz (87.3 kg) 191 lb 5.8 oz (86.8 kg)      Comment per labs through [place] on 11/24/22: triglycerides 162, LDL 67 (goal <70)- patient is statin intolerant, A1c 6.1 No new labs; per labs through [place] on 11/24/22: triglycerides 162, LDL 67 (goal <70)- patient is statin intolerant, A1c 6.1      Expected Outcome Luis Guzman reports motivation to begin the [place] Diet. He typically eats 1-2 meals per day and he does the majority of cooking at home. His wife is supportive of lifestyle changes. Patient will benefit from participation in intensive cardiac rehab for nutrition, exercise, and lifestyle modification. Goals in progress. Luis Guzman continues to attend the [employer] and nutrition series. He has better understanding of fiber recommendations, strategies for reducing saturated fat, and reading food labels for sodium and sugar. He has maintained his weight since starting with our program. No new labs at this time; his A1c remains in a prediabetic range. His wife is supportive of lifestyle changes. Patient will benefit from participation in intensive cardiac rehab for nutrition, exercise, and lifestyle modification.               Nutrition Goals Discharge (Final Nutrition Goals Re-Evaluation):  Nutrition Goals Re-Evaluation - 04/24/23 0935       Goals   Current Weight 191 lb 5.8 oz (86.8 kg)    Comment No new labs; per labs  through [place] on 11/24/22: triglycerides 162, LDL 67 (goal <70)- patient is statin intolerant, A1c 6.1    Expected Outcome  Goals in progress. Luis Guzman continues to attend the [employer] and nutrition series. He has better understanding of fiber recommendations, strategies for reducing saturated fat, and reading food labels for sodium and sugar. He has maintained his weight since starting with our program. No new labs at this time; his A1c remains in a prediabetic range. His wife is supportive of lifestyle changes. Patient will benefit from participation in intensive cardiac rehab for nutrition, exercise, and lifestyle modification.             Psychosocial: Target Goals: Acknowledge presence or absence of significant depression and/or stress, maximize coping skills, provide positive support system. Participant is able to verbalize types and ability to use techniques and skills needed for reducing stress and depression.  Initial Review & Psychosocial Screening:  Initial Psych Review & Screening - 03/14/23 0832       Initial Review   Current issues with None Identified      Family Dynamics   Good Support System? Yes   Luis Guzman has his wife for support     Barriers   Psychosocial barriers to participate in program There are no identifiable barriers or psychosocial needs.      Screening Interventions   Interventions Encouraged to exercise             Quality of Life Scores:  Quality of Life - 03/14/23 0940       Quality of Life   Select Quality of Life      Quality of Life Scores   Health/Function Pre 22.77 %    Socioeconomic Pre 26.5 %    Psych/Spiritual Pre 30 %    Family Pre 28.8 %    GLOBAL Pre 26.1 %            Scores of 19 and below usually indicate a poorer quality of life in these areas.  A difference of  2-3 points is a clinically meaningful difference.  A difference of 2-3 points in the total score of the Quality of Life Index has been associated with  significant improvement in overall quality of life, self-image, physical symptoms, and general health in studies assessing change in quality of life.  PHQ-9: Review Flowsheet  More data exists      03/14/2023 08/22/2022 05/20/2022 05/16/2022 05/07/2021  Depression screen PHQ 2/9  Decreased Interest 0 0 0 0 0  Down, Depressed, Hopeless 0 0 0 0 0  PHQ - 2 Score 0 0 0 0 0  Altered sleeping 1 - - - -  Tired, decreased energy 1 - - - -  Change in appetite 0 - - - -  Feeling bad or failure about yourself  0 - - - -  Trouble concentrating 0 - - - -  Moving slowly or fidgety/restless 0 - - - -  Suicidal thoughts 0 - - - -  PHQ-9 Score 2 - - - -  Difficult doing work/chores Not difficult at all - - - -    Details           Interpretation of Total Score  Total Score Depression Severity:  1-4 = Minimal depression, 5-9 = Mild depression, 10-14 = Moderate depression, 15-19 = Moderately severe depression, 20-27 = Severe depression   Psychosocial Evaluation and Intervention:   Psychosocial Re-Evaluation:  Psychosocial Re-Evaluation     Row Name 05/09/23 1051             Psychosocial Re-Evaluation   Comments unable to asess as exercise is currently on  hold                Psychosocial Discharge (Final Psychosocial Re-Evaluation):  Psychosocial Re-Evaluation - 05/09/23 1051       Psychosocial Re-Evaluation   Comments unable to asess as exercise is currently on hold             Vocational Rehabilitation: Provide vocational rehab assistance to qualifying candidates.   Vocational Rehab Evaluation & Intervention:  Vocational Rehab - 03/14/23 0834       Initial Vocational Rehab Evaluation & Intervention   Assessment shows need for Vocational Rehabilitation No   Luis Guzman is retired and does not need vocational rehab at this time            Education: Education Goals: Education classes will be provided on a weekly basis, covering required topics. Participant will state  understanding/return demonstration of topics presented.    Education     Row Name 03/24/23 0900     Education   Cardiac Education Topics Pritikin   Select Core Videos     Core Videos   Educator Exercise Physiologist   Select Psychosocial   Psychosocial How Our Thoughts Can Heal Our Hearts   Instruction Review Code 1- Verbalizes Understanding   Class Start Time 0807   Class Stop Time 0842   Class Time Calculation (min) 35 min    Row Name 03/27/23 0900     Education   Cardiac Education Topics Pritikin   Select Workshops     Core Videos   Educator --   Select --   General Education --   Instruction Review Code --     Workshops   Biomedical scientist Exercise   Exercise Workshop Managing Heart Disease: Your Path to a Healthier Heart   Instruction Review Code 1- Verbalizes Understanding   Class Start Time [phone]   Class Stop Time 0908   Class Time Calculation (min) 60 min    Row Name 03/29/23 1000     Education   Cardiac Education Topics Pritikin   Secondary school teacher School   Educator Dietitian   Weekly Topic [employer] Plant-Based Proteins   Instruction Review Code 1- Verbalizes Understanding   Class Start Time 0815   Class Stop Time 0856   Class Time Calculation (min) 41 min    Row Name 03/31/23 0900     Education   Cardiac Education Topics Pritikin   Select Core Videos     Core Videos   Educator Exercise Physiologist   Select General Education   General Education Hypertension and Heart Disease   Instruction Review Code 1- Verbalizes Understanding   Class Start Time 0810   Class Stop Time 0850   Class Time Calculation (min) 40 min    Row Name 04/14/23 0800     Education   Cardiac Education Topics Pritikin   Nurse, children's   Educator Exercise Physiologist   Select Psychosocial   Psychosocial Healthy Minds, Bodies, Hearts   Instruction Review Code 1- Verbalizes Understanding   Class Start  Time 0813   Class Stop Time 0845   Class Time Calculation (min) 32 min    Row Name 04/19/23 1000     Education   Cardiac Education Topics Pritikin   Customer service manager   Weekly Topic Personalizing Your Pritikin Plate   Instruction Review Code 1- Verbalizes Understanding  Class Start Time 0815   Class Stop Time 0848   Class Time Calculation (min) 33 min    Row Name 04/21/23 0900     Education   Cardiac Education Topics Pritikin   Select Workshops     Workshops   Educator Exercise Physiologist   Select Exercise   Exercise Workshop Location manager and Fall Prevention   Instruction Review Code 1- Verbalizes Understanding   Class Start Time [phone]   Class Stop Time 0856   Class Time Calculation (min) 45 min    Row Name 04/26/23 1100     Education   Cardiac Education Topics Pritikin   Customer service manager   Weekly Topic [employer] Desserts   Instruction Review Code 1- Verbalizes Understanding   Class Start Time 0820   Class Stop Time 0900   Class Time Calculation (min) 40 min    Row Name 04/28/23 1000     Education   Cardiac Education Topics Pritikin   Licensed conveyancer Nutrition   Nutrition Other   Instruction Review Code 1- Verbalizes Understanding   Class Start Time 0815   Class Stop Time 0900   Class Time Calculation (min) 45 min    Row Name 05/01/23 1100     Education   Cardiac Education Topics Pritikin   [employer]     Workshops   Educator Exercise Physiologist   Select Psychosocial   Psychosocial Workshop Recognizing and Reducing Stress   Instruction Review Code 1- Verbalizes Understanding   Class Start Time 0815   Class Stop Time 0900   Class Time Calculation (min) 45 min            Core Videos: Exercise    Move It!  Clinical staff conducted group or individual video education with verbal and  written material and guidebook.  Patient learns the recommended Pritikin exercise program. Exercise with the goal of living a long, healthy life. Some of the health benefits of exercise include controlled diabetes, healthier blood pressure levels, improved cholesterol levels, improved heart and lung capacity, improved sleep, and better body composition. Everyone should speak with their doctor before starting or changing an exercise routine.  Biomechanical Limitations Clinical staff conducted group or individual video education with verbal and written material and guidebook.  Patient learns how biomechanical limitations can impact exercise and how we can mitigate and possibly overcome limitations to have an impactful and balanced exercise routine.  Body Composition Clinical staff conducted group or individual video education with verbal and written material and guidebook.  Patient learns that body composition (ratio of muscle mass to fat mass) is a key component to assessing overall fitness, rather than body weight alone. Increased fat mass, especially visceral belly fat, can put [place] at increased risk for metabolic syndrome, type 2 diabetes, heart disease, and even death. It is recommended to combine diet and exercise (cardiovascular and resistance training) to improve your body composition. Seek guidance from your physician and exercise physiologist before implementing an exercise routine.  Exercise Action Plan Clinical staff conducted group or individual video education with verbal and written material and guidebook.  Patient learns the recommended strategies to achieve and enjoy long-term exercise adherence, including variety, self-motivation, self-efficacy, and positive decision making. Benefits of exercise include fitness, good health, weight management, more energy, better sleep, less stress, and overall well-being.  Medical   Heart Disease  Risk Reduction Clinical staff conducted group or  individual video education with verbal and written material and guidebook.  Patient learns our heart is our most vital organ as it circulates oxygen, nutrients, white blood cells, and hormones throughout the entire body, and carries waste away. Data supports a plant-based eating plan like the Pritikin Program for its effectiveness in slowing progression of and reversing heart disease. The video provides a number of recommendations to address heart disease.   Metabolic Syndrome and Belly Fat  Clinical staff conducted group or individual video education with verbal and written material and guidebook.  Patient learns what metabolic syndrome is, how it leads to heart disease, and how one can reverse it and keep it from coming back. You have metabolic syndrome if you have 3 of the following 5 criteria: abdominal obesity, high blood pressure, high triglycerides, low HDL cholesterol, and high blood sugar.  Hypertension and Heart Disease Clinical staff conducted group or individual video education with verbal and written material and guidebook.  Patient learns that high blood pressure, or hypertension, is very common in the [place]. Hypertension is largely due to excessive salt intake, but other important risk factors include being overweight, physical inactivity, drinking too much alcohol, smoking, and not eating enough potassium from fruits and vegetables. High blood pressure is a leading risk factor for heart attack, stroke, congestive heart failure, dementia, kidney failure, and premature death. Long-term effects of excessive salt intake include stiffening of the arteries and thickening of heart muscle and organ damage. Recommendations include ways to reduce hypertension and the risk of heart disease.  Diseases of Our Time - Focusing on Diabetes Clinical staff conducted group or individual video education with verbal and written material and guidebook.  Patient learns why the best way to stop diseases  of our time is prevention, through food and other lifestyle changes. Medicine (such as prescription pills and surgeries) is often only a Band-Aid on the problem, not a long-term solution. Most common diseases of our time include obesity, type 2 diabetes, hypertension, heart disease, and cancer. The Pritikin Program is recommended and has been proven to help reduce, reverse, and/or prevent the damaging effects of metabolic syndrome.  Nutrition   Overview of the Pritikin Eating Plan  Clinical staff conducted group or individual video education with verbal and written material and guidebook.  Patient learns about the Pritikin Eating Plan for disease risk reduction. The Pritikin Eating Plan emphasizes a wide variety of unrefined, minimally-processed carbohydrates, like fruits, vegetables, whole grains, and legumes. Go, Caution, and Stop food choices are explained. Plant-based and lean animal proteins are emphasized. Rationale provided for low sodium intake for blood pressure control, low added sugars for blood sugar stabilization, and low added fats and oils for coronary artery disease risk reduction and weight management.  Calorie Density  Clinical staff conducted group or individual video education with verbal and written material and guidebook.  Patient learns about calorie density and how it impacts the Pritikin Eating Plan. Knowing the characteristics of the food you choose will help you decide whether those foods will lead to weight gain or weight loss, and whether you want to consume more or less of them. Weight loss is usually a side effect of the Pritikin Eating Plan because of its focus on low calorie-dense foods.  Label Reading  Clinical staff conducted group or individual video education with verbal and written material and guidebook.  Patient learns about the Pritikin recommended label reading guidelines and corresponding recommendations regarding calorie  density, added sugars, sodium content,  and whole grains.  Dining Out - Part 1  Clinical staff conducted group or individual video education with verbal and written material and guidebook.  Patient learns that restaurant meals can be sabotaging because they can be so high in calories, fat, sodium, and/or sugar. Patient learns recommended strategies on how to positively address this and avoid unhealthy pitfalls.  Facts on Fats  Clinical staff conducted group or individual video education with verbal and written material and guidebook.  Patient learns that lifestyle modifications can be just as effective, if not more so, as many medications for lowering your risk of heart disease. A Pritikin lifestyle can help to reduce your risk of inflammation and atherosclerosis (cholesterol build-up, or plaque, in the artery walls). Lifestyle interventions such as dietary choices and physical activity address the cause of atherosclerosis. A review of the types of fats and their impact on blood cholesterol levels, along with dietary recommendations to reduce fat intake is also included.  Nutrition Action Plan  Clinical staff conducted group or individual video education with verbal and written material and guidebook.  Patient learns how to incorporate Pritikin recommendations into their lifestyle. Recommendations include planning and keeping personal health goals in mind as an important part of their success.  Healthy Mind-Set    Healthy Minds, Bodies, Hearts  Clinical staff conducted group or individual video education with verbal and written material and guidebook.  Patient learns how to identify when they are stressed. Video will discuss the impact of that stress, as well as the many benefits of stress management. Patient will also be introduced to stress management techniques. The way we think, act, and feel has an impact on our hearts.  How Our Thoughts Can Heal Our Hearts  Clinical staff conducted group or individual video education with verbal  and written material and guidebook.  Patient learns that negative thoughts can cause depression and anxiety. This can result in negative lifestyle behavior and serious health problems. Cognitive behavioral therapy is an effective method to help control our thoughts in order to change and improve our emotional outlook.  Additional Videos:  Exercise    Improving Performance  Clinical staff conducted group or individual video education with verbal and written material and guidebook.  Patient learns to use a non-linear approach by alternating intensity levels and lengths of time spent exercising to help burn more calories and lose more body fat. Cardiovascular exercise helps improve heart health, metabolism, hormonal balance, blood sugar control, and recovery from fatigue. Resistance training improves strength, endurance, balance, coordination, reaction time, metabolism, and muscle mass. Flexibility exercise improves circulation, posture, and balance. Seek guidance from your physician and exercise physiologist before implementing an exercise routine and learn your capabilities and proper form for all exercise.  Introduction to Yoga  Clinical staff conducted group or individual video education with verbal and written material and guidebook.  Patient learns about yoga, a discipline of the coming together of mind, breath, and body. The benefits of yoga include improved flexibility, improved range of motion, better posture and core strength, increased lung function, weight loss, and positive self-image. Yoga's heart health benefits include lowered blood pressure, healthier heart rate, decreased cholesterol and triglyceride levels, improved immune function, and reduced stress. Seek guidance from your physician and exercise physiologist before implementing an exercise routine and learn your capabilities and proper form for all exercise.  Medical   Aging: Enhancing Your Quality of Life  Clinical staff conducted  group or individual video education  with verbal and written material and guidebook.  Patient learns key strategies and recommendations to stay in good physical health and enhance quality of life, such as prevention strategies, having an advocate, securing a Health Care Proxy and Power of Attorney, and keeping a list of medications and system for tracking them. It also discusses how to avoid risk for bone loss.  Biology of Weight Control  Clinical staff conducted group or individual video education with verbal and written material and guidebook.  Patient learns that weight gain occurs because we consume more calories than we burn (eating more, moving less). Even if your body weight is normal, you may have higher ratios of fat compared to muscle mass. Too much body fat puts you at increased risk for cardiovascular disease, heart attack, stroke, type 2 diabetes, and obesity-related cancers. In addition to exercise, following the Pritikin Eating Plan can help reduce your risk.  Decoding Lab Results  Clinical staff conducted group or individual video education with verbal and written material and guidebook.  Patient learns that lab test reflects one measurement whose values change over time and are influenced by many factors, including medication, stress, sleep, exercise, food, hydration, pre-existing medical conditions, and more. It is recommended to use the knowledge from this video to become more involved with your lab results and evaluate your numbers to speak with your doctor.   Diseases of Our Time - Overview  Clinical staff conducted group or individual video education with verbal and written material and guidebook.  Patient learns that according to the CDC, 50% to 70% of chronic diseases (such as obesity, type 2 diabetes, elevated lipids, hypertension, and heart disease) are avoidable through lifestyle improvements including healthier food choices, listening to satiety cues, and increased physical  activity.  Sleep Disorders Clinical staff conducted group or individual video education with verbal and written material and guidebook.  Patient learns how good quality and duration of sleep are important to overall health and well-being. Patient also learns about sleep disorders and how they impact health along with recommendations to address them, including discussing with a physician.  Nutrition  Dining Out - Part 2 Clinical staff conducted group or individual video education with verbal and written material and guidebook.  Patient learns how to plan ahead and communicate in order to maximize their dining experience in a healthy and nutritious manner. Included are recommended food choices based on the type of restaurant the patient is visiting.   Fueling a Banker conducted group or individual video education with verbal and written material and guidebook.  There is a strong connection between our food choices and our health. Diseases like obesity and type 2 diabetes are very prevalent and are in large-part due to lifestyle choices. The Pritikin Eating Plan provides plenty of food and hunger-curbing satisfaction. It is easy to follow, affordable, and helps reduce health risks.  Menu Workshop  Clinical staff conducted group or individual video education with verbal and written material and guidebook.  Patient learns that restaurant meals can sabotage health goals because they are often packed with calories, fat, sodium, and sugar. Recommendations include strategies to plan ahead and to communicate with the manager, chef, or server to help order a healthier meal.  Planning Your Eating Strategy  Clinical staff conducted group or individual video education with verbal and written material and guidebook.  Patient learns about the Pritikin Eating Plan and its benefit of reducing the risk of disease. The Pritikin Eating Plan does not focus  on calories. Instead, it emphasizes  high-quality, nutrient-rich foods. By knowing the characteristics of the foods, we choose, we can determine their calorie density and make informed decisions.  Targeting Your Nutrition Priorities  Clinical staff conducted group or individual video education with verbal and written material and guidebook.  Patient learns that lifestyle habits have a tremendous impact on disease risk and progression. This video provides eating and physical activity recommendations based on your personal health goals, such as reducing LDL cholesterol, losing weight, preventing or controlling type 2 diabetes, and reducing high blood pressure.  Vitamins and Minerals  Clinical staff conducted group or individual video education with verbal and written material and guidebook.  Patient learns different ways to obtain key vitamins and minerals, including through a recommended healthy diet. It is important to discuss all supplements you take with your doctor.   Healthy Mind-Set    Smoking Cessation  Clinical staff conducted group or individual video education with verbal and written material and guidebook.  Patient learns that cigarette smoking and tobacco addiction pose a serious health risk which affects millions of people. Stopping smoking will significantly reduce the risk of heart disease, lung disease, and many forms of cancer. Recommended strategies for quitting are covered, including working with your doctor to develop a successful plan.  Culinary   Becoming a Set designer conducted group or individual video education with verbal and written material and guidebook.  Patient learns that cooking at home can be healthy, cost-effective, quick, and puts them in control. Keys to cooking healthy recipes will include looking at your recipe, assessing your equipment needs, planning ahead, making it simple, choosing cost-effective seasonal ingredients, and limiting the use of added fats, salts, and  sugars.  Cooking - Breakfast and Snacks  Clinical staff conducted group or individual video education with verbal and written material and guidebook.  Patient learns how important breakfast is to satiety and nutrition through the entire day. Recommendations include key foods to eat during breakfast to help stabilize blood sugar levels and to prevent overeating at meals later in the day. Planning ahead is also a key component.  Cooking - Educational psychologist conducted group or individual video education with verbal and written material and guidebook.  Patient learns eating strategies to improve overall health, including an approach to cook more at home. Recommendations include thinking of animal protein as a side on your plate rather than center stage and focusing instead on lower calorie dense options like vegetables, fruits, whole grains, and plant-based proteins, such as beans. Making sauces in large quantities to freeze for later and leaving the skin on your vegetables are also recommended to maximize your experience.  Cooking - Healthy Salads and Dressing Clinical staff conducted group or individual video education with verbal and written material and guidebook.  Patient learns that vegetables, fruits, whole grains, and legumes are the foundations of the Pritikin Eating Plan. Recommendations include how to incorporate each of these in flavorful and healthy salads, and how to create homemade salad dressings. Proper handling of ingredients is also covered. Cooking - Soups and [employer] - Soups and Desserts Clinical staff conducted group or individual video education with verbal and written material and guidebook.  Patient learns that Pritikin soups and desserts make for easy, nutritious, and delicious snacks and meal components that are low in sodium, fat, sugar, and calorie density, while high in vitamins, minerals, and filling fiber. Recommendations include simple and healthy  ideas for  soups and desserts.   Overview     The Pritikin Solution Program Overview Clinical staff conducted group or individual video education with verbal and written material and guidebook.  Patient learns that the results of the Pritikin Program have been documented in more than 100 articles published in peer-reviewed journals, and the benefits include reducing risk factors for (and, in some cases, even reversing) high cholesterol, high blood pressure, type 2 diabetes, obesity, and more! An overview of the three key pillars of the Pritikin Program will be covered: eating well, doing regular exercise, and having a healthy mind-set.  WORKSHOPS  Exercise: Exercise Basics: Building Your Action Plan Clinical staff led group instruction and group discussion with PowerPoint presentation and patient guidebook. To enhance the learning environment the use of posters, models and videos may be added. At the conclusion of this workshop, patients will comprehend the difference between physical activity and exercise, as well as the benefits of incorporating both, into their routine. Patients will understand the FITT (Frequency, Intensity, Time, and Type) principle and how to use it to build an exercise action plan. In addition, safety concerns and other considerations for exercise and cardiac rehab will be addressed by the presenter. The purpose of this lesson is to promote a comprehensive and effective weekly exercise routine in order to improve patients' overall level of fitness.   Managing Heart Disease: Your Path to a Healthier Heart Clinical staff led group instruction and group discussion with PowerPoint presentation and patient guidebook. To enhance the learning environment the use of posters, models and videos may be added.At the conclusion of this workshop, patients will understand the anatomy and physiology of the heart. Additionally, they will understand how Pritikin's three pillars impact the  risk factors, the progression, and the management of heart disease.  The purpose of this lesson is to provide a high-level overview of the heart, heart disease, and how the Pritikin lifestyle positively impacts risk factors.  Exercise Biomechanics Clinical staff led group instruction and group discussion with PowerPoint presentation and patient guidebook. To enhance the learning environment the use of posters, models and videos may be added. Patients will learn how the structural parts of their bodies function and how these functions impact their daily activities, movement, and exercise. Patients will learn how to promote a neutral spine, learn how to manage pain, and identify ways to improve their physical movement in order to promote healthy living. The purpose of this lesson is to expose patients to common physical limitations that impact physical activity. Participants will learn practical ways to adapt and manage aches and pains, and to minimize their effect on regular exercise. Patients will learn how to maintain good posture while sitting, walking, and lifting.  Balance Training and Fall Prevention  Clinical staff led group instruction and group discussion with PowerPoint presentation and patient guidebook. To enhance the learning environment the use of posters, models and videos may be added. At the conclusion of this workshop, patients will understand the importance of their sensorimotor skills (vision, proprioception, and the vestibular system) in maintaining their ability to balance as they age. Patients will apply a variety of balancing exercises that are appropriate for their current level of function. Patients will understand the common causes for poor balance, possible solutions to these problems, and ways to modify their physical environment in order to minimize their fall risk. The purpose of this lesson is to teach patients about the importance of maintaining balance as they age  and ways to minimize their  risk of falling.  WORKSHOPS   Nutrition:  Fueling a Ship broker led group instruction and group discussion with PowerPoint presentation and patient guidebook. To enhance the learning environment the use of posters, models and videos may be added. Patients will review the foundational principles of the Pritikin Eating Plan and understand what constitutes a serving size in each of the food groups. Patients will also learn Pritikin-friendly foods that are better choices when away from home and review make-ahead meal and snack options. Calorie density will be reviewed and applied to three nutrition priorities: weight maintenance, weight loss, and weight gain. The purpose of this lesson is to reinforce (in a group setting) the key concepts around what patients are recommended to eat and how to apply these guidelines when away from home by planning and selecting Pritikin-friendly options. Patients will understand how calorie density may be adjusted for different weight management goals.  Mindful Eating  Clinical staff led group instruction and group discussion with PowerPoint presentation and patient guidebook. To enhance the learning environment the use of posters, models and videos may be added. Patients will briefly review the concepts of the Pritikin Eating Plan and the importance of low-calorie dense foods. The concept of mindful eating will be introduced as well as the importance of paying attention to internal hunger signals. Triggers for non-hunger eating and techniques for dealing with triggers will be explored. The purpose of this lesson is to provide patients with the opportunity to review the basic principles of the Pritikin Eating Plan, discuss the value of eating mindfully and how to measure internal cues of hunger and fullness using the Hunger Scale. Patients will also discuss reasons for non-hunger eating and learn strategies to use for controlling  emotional eating.  Targeting Your Nutrition Priorities Clinical staff led group instruction and group discussion with PowerPoint presentation and patient guidebook. To enhance the learning environment the use of posters, models and videos may be added. Patients will learn how to determine their genetic susceptibility to disease by reviewing their family history. Patients will gain insight into the importance of diet as part of an overall healthy lifestyle in mitigating the impact of genetics and other environmental insults. The purpose of this lesson is to provide patients with the opportunity to assess their personal nutrition priorities by looking at their family history, their own health history and current risk factors. Patients will also be able to discuss ways of prioritizing and modifying the Pritikin Eating Plan for their highest risk areas  Menu  Clinical staff led group instruction and group discussion with PowerPoint presentation and patient guidebook. To enhance the learning environment the use of posters, models and videos may be added. Using menus brought in from [employer], or printed from [employer], patients will apply the Pritikin dining out guidelines that were presented in the [employer] video. Patients will also be able to practice these guidelines in a variety of provided scenarios. The purpose of this lesson is to provide patients with the opportunity to practice hands-on learning of the Pritikin Dining Out guidelines with actual menus and practice scenarios.  Label Reading Clinical staff led group instruction and group discussion with PowerPoint presentation and patient guidebook. To enhance the learning environment the use of posters, models and videos may be added. Patients will review and discuss the Pritikin label reading guidelines presented in [employer]'s Label Reading Educational series video. Using fool labels brought in from local grocery stores  and markets, patients will apply  the label reading guidelines and determine if the packaged food meet the Pritikin guidelines. The purpose of this lesson is to provide patients with the opportunity to review, discuss, and practice hands-on learning of the Pritikin Label Reading guidelines with actual packaged food labels. Cooking School  P[facility]'s [employer] are designed to teach patients ways to prepare quick, simple, and affordable recipes at home. The importance of nutrition's role in chronic disease risk reduction is reflected in its emphasis in the overall Pritikin program. By learning how to prepare essential core Pritikin Eating Plan recipes, patients will increase control over what they eat; be able to customize the flavor of foods without the use of added salt, sugar, or fat; and improve the quality of the food they consume. By learning a set of core recipes which are easily assembled, quickly prepared, and affordable, patients are more likely to prepare more healthy foods at home. These workshops focus on convenient breakfasts, simple entres, side dishes, and desserts which can be prepared with minimal effort and are consistent with nutrition recommendations for cardiovascular risk reduction. Cooking [employer] are taught by a Armed forces logistics/support/administrative officer (RD) who has been trained by the [employer]. The chef or RD has a clear understanding of the importance of minimizing - if not completely eliminating - added fat, sugar, and sodium in recipes. Throughout the series of Cooking School Workshop sessions, patients will learn about healthy ingredients and efficient methods of cooking to build confidence in their capability to prepare    Cooking School weekly topics:  Adding Flavor- Sodium-Free  Fast and Healthy Breakfasts  Powerhouse Plant-Based Proteins  Satisfying Salads and Dressings  Simple Sides and Sauces  International Cuisine-Spotlight on the [employer]  Zones  Delicious Desserts  Savory Soups  [employer] - Meals in a Astronomer Appetizers and Snacks  Comforting Weekend Breakfasts  One-Pot Wonders   Fast Evening Meals  Landscape architect Your Pritikin Plate  WORKSHOPS   Healthy Mindset (Psychosocial):  Focused Goals, Sustainable Changes Clinical staff led group instruction and group discussion with PowerPoint presentation and patient guidebook. To enhance the learning environment the use of posters, models and videos may be added. Patients will be able to apply effective goal setting strategies to establish at least one personal goal, and then take consistent, meaningful action toward that goal. They will learn to identify common barriers to achieving personal goals and develop strategies to overcome them. Patients will also gain an understanding of how our mind-set can impact our ability to achieve goals and the importance of cultivating a positive and growth-oriented mind-set. The purpose of this lesson is to provide patients with a deeper understanding of how to set and achieve personal goals, as well as the tools and strategies needed to overcome common obstacles which may arise along the way.  From Head to Heart: The Power of a Healthy Outlook  Clinical staff led group instruction and group discussion with PowerPoint presentation and patient guidebook. To enhance the learning environment the use of posters, models and videos may be added. Patients will be able to recognize and describe the impact of emotions and mood on physical health. They will discover the importance of self-care and explore self-care practices which may work for them. Patients will also learn how to utilize the 4 C's to cultivate a healthier outlook and better manage stress and challenges. The purpose of this lesson is to demonstrate to patients how a healthy outlook is an essential  part of maintaining good health, especially as they continue their  cardiac rehab journey.  Healthy Sleep for a Healthy Heart Clinical staff led group instruction and group discussion with PowerPoint presentation and patient guidebook. To enhance the learning environment the use of posters, models and videos may be added. At the conclusion of this workshop, patients will be able to demonstrate knowledge of the importance of sleep to overall health, well-being, and quality of life. They will understand the symptoms of, and treatments for, common sleep disorders. Patients will also be able to identify daytime and nighttime behaviors which impact sleep, and they will be able to apply these tools to help manage sleep-related challenges. The purpose of this lesson is to provide patients with a general overview of sleep and outline the importance of quality sleep. Patients will learn about a few of the most common sleep disorders. Patients will also be introduced to the concept of "sleep hygiene," and discover ways to self-manage certain sleeping problems through simple daily behavior changes. Finally, the workshop will motivate patients by clarifying the links between quality sleep and their goals of heart-healthy living.   Recognizing and Reducing Stress Clinical staff led group instruction and group discussion with PowerPoint presentation and patient guidebook. To enhance the learning environment the use of posters, models and videos may be added. At the conclusion of this workshop, patients will be able to understand the types of stress reactions, differentiate between acute and chronic stress, and recognize the impact that chronic stress has on their health. They will also be able to apply different coping mechanisms, such as reframing negative self-talk. Patients will have the opportunity to practice a variety of stress management techniques, such as deep abdominal breathing, progressive muscle relaxation, and/or guided imagery.  The purpose of this lesson is to educate  patients on the role of stress in their lives and to provide healthy techniques for coping with it.  Learning Barriers/Preferences:  Learning Barriers/Preferences - 03/14/23 0949       Learning Barriers/Preferences   Learning Barriers Hearing    Learning Preferences Written Material;Video             Education Topics:  Knowledge Questionnaire Score:  Knowledge Questionnaire Score - 03/14/23 0940       Knowledge Questionnaire Score   Pre Score 23/24             Core Components/Risk Factors/Patient Goals at Admission:  Personal Goals and Risk Factors at Admission - 03/14/23 0940       Core Components/Risk Factors/Patient Goals on Admission   Hypertension Yes    Intervention Provide education on lifestyle modifcations including regular physical activity/exercise, weight management, moderate sodium restriction and increased consumption of fresh fruit, vegetables, and low fat dairy, alcohol moderation, and smoking cessation.;Monitor prescription use compliance.    Expected Outcomes Short Term: Continued assessment and intervention until BP is < 140/30mm HG in hypertensive participants. < 130/34mm HG in hypertensive participants with diabetes, heart failure or chronic kidney disease.;Long Term: Maintenance of blood pressure at goal levels.    Lipids Yes    Intervention Provide education and support for participant on nutrition & aerobic/resistive exercise along with prescribed medications to achieve LDL 70mg , HDL >40mg .    Expected Outcomes Short Term: Participant states understanding of desired cholesterol values and is compliant with medications prescribed. Participant is following exercise prescription and nutrition guidelines.;Long Term: Cholesterol controlled with medications as prescribed, with individualized exercise RX and with personalized nutrition plan. Value goals: LDL < 70mg ,  HDL > 40 mg.    Personal Goal Other Yes    Personal Goal Increase exercise frequency and  duration. Find healthy dishes. Be able to do things without becoming short of breath and having chest pain.    Intervention Bowdrie will attend Exercise Workshop and get individual exercise prescription to help develop an Exercise Action Plan to help increase cardiorespiratory fitness and increase exercise tolerance. He will attend Nutrition classes and cooking school to help find new heart healthy dishes.    Expected Outcomes Luis Guzman will comply with Exercise Action Plan and be able to do more activities for longer duration without SOB and angina as measured by self-report. He will have access to heart healthy recipies that he can prepare at home.             Core Components/Risk Factors/Patient Goals Review:   Goals and Risk Factor Review     Row Name 05/09/23 1051             Core Components/Risk Factors/Patient Goals Review   Personal Goals Review Weight Management/Obesity;Hypertension;Lipids       Review Exercise is currently on hold due to ongoing chest pressure and shortness of breath during exercise at cardiac rehab.       Expected Outcomes Luis Guzman will continue to participate in cardiac rehab for exercise, nutrition and lifestyle modifications when cleared to return to exercise.                Core Components/Risk Factors/Patient Goals at Discharge (Final Review):   Goals and Risk Factor Review - 05/09/23 1051       Core Components/Risk Factors/Patient Goals Review   Personal Goals Review Weight Management/Obesity;Hypertension;Lipids    Review Exercise is currently on hold due to ongoing chest pressure and shortness of breath during exercise at cardiac rehab.    Expected Outcomes Luis Guzman will continue to participate in cardiac rehab for exercise, nutrition and lifestyle modifications when cleared to return to exercise.             ITP Comments:  ITP Comments     Row Name 03/14/23 0744 04/11/23 1102 05/09/23 1049       ITP Comments Medical Director- Dr. Armanda Magic, MD. Introduced Mr. Skeans to the [facility]in Education Program / Intensive Cardiac Rehab. Reviewed intial orientation folder with him. 30 Day ITP Review. Cardiac Rehab is currently on hold due to ongoing Angina requiring sublingual nitroglycerin. Evrett has an appointment with the heart failure clinic on 04/12/23. 30 Day ITP Review. Cardiac Rehab is currently on hold due to ongoing Angina requiring sublingual nitroglycerin per Dr Herbie Baltimore. Adesh is to have a PET scan than follow up with Dr Herbie Baltimore. Appointmrent with Dr Herbie Baltimore is on 06/07/23.              Comments: See ITP comments. Exercise is currently on hold.Thayer Headings RN BSN

## 2023-05-09 NOTE — Telephone Encounter (Signed)
Spoke to patient advised 04/2024 date is when cardiac pet scan order expires.Stated he would like to have done before he goes on cruise 10/3 if possible.Advised I will send message to that dept and let them know.

## 2023-05-09 NOTE — Telephone Encounter (Signed)
Pt called in about his order for PET scan. He wants to know if the notes in his order can be changed. He states he saw the expected date of service was 2025 because he was going on vacation, but he would like done this year anytime after 05/26/23.

## 2023-05-10 ENCOUNTER — Encounter (HOSPITAL_COMMUNITY): Payer: No Typology Code available for payment source

## 2023-05-10 ENCOUNTER — Telehealth: Payer: Self-pay

## 2023-05-10 NOTE — Telephone Encounter (Signed)
How to Prepare for Your Cardiac PET/CT Stress Test:  1. Please do not take these medications before your test:   Medications that may interfere with the cardiac pharmacological stress agent (ex. nitrates - including erectile dysfunction medications, isosorbide mononitrate, tamulosin or beta-blockers) the day of the exam. (Erectile dysfunction medication should be held for at least 72 hrs prior to test)  Your remaining medications may be taken with water.  2. Nothing to eat or drink, except water, 3 hours prior to arrival time.   NO caffeine/decaffeinated products, or chocolate 12 hours prior to arrival.  3. NO perfume, cologne or lotion on chest or abdomen area.           4. Total time is 1 to 2 hours; you may want to bring reading material for the waiting time.  5. Please report to Radiology at the [facility] Main Entrance 30 minutes early for your test.  [address] [place], [place] [address]  6. Please report to Radiology at [facility] Main Entrance, medical mall, 30 mins prior to your test.  [address]  [place], [place]  [phone]  In preparation for your appointment, medication and supplies will be purchased.  Appointment availability is limited, so if you need to cancel or reschedule, please call the Radiology Department at [phone] [facility]) OR [phone] [facility])  24 hours in advance to avoid a cancellation fee of $100.00  What to Expect After you Arrive:  Once you arrive and check in for your appointment, you will be taken to a preparation room within the Radiology Department.  A technologist or Nurse will obtain your medical history, verify that you are correctly prepped for the exam, and explain the procedure.  Afterwards,  an IV will be started in your arm and electrodes will be placed on your skin for EKG monitoring during the stress portion of the exam. Then you will be escorted to the PET/CT scanner.  There, staff  will get you positioned on the scanner and obtain a blood pressure and EKG.  During the exam, you will continue to be connected to the EKG and blood pressure machines.  A small, safe amount of a radioactive tracer will be injected in your IV to obtain a series of pictures of your heart along with an injection of a stress agent.    After your Exam:  It is recommended that you eat a meal and drink a caffeinated beverage to counter act any effects of the stress agent.  Drink plenty of fluids for the remainder of the day and urinate frequently for the first couple of hours after the exam.  Your doctor will inform you of your test results within 7-10 business days.  For more information and frequently asked questions, please visit our website : [email or web address]  For questions about your test or how to prepare for your test, please call: Cardiac Imaging Nurse Navigators Office: [phone]

## 2023-05-12 ENCOUNTER — Encounter (HOSPITAL_COMMUNITY): Payer: No Typology Code available for payment source

## 2023-05-12 ENCOUNTER — Telehealth (HOSPITAL_COMMUNITY): Payer: Self-pay | Admitting: *Deleted

## 2023-05-12 NOTE — Telephone Encounter (Signed)
Reaching out to patient to offer assistance regarding upcoming cardiac imaging study; pt verbalizes understanding of appt date/time, parking situation and where to check in, pre-test NPO status and medications ordered, and verified current allergies; name and call back number provided for further questions should they arise Johney Frame RN Navigator Cardiac Imaging [facility] [phone] office [phone] cell  Patient aware to hold imdur and avoid caffeine for 12 hours prior.

## 2023-05-15 ENCOUNTER — Encounter (HOSPITAL_COMMUNITY): Payer: No Typology Code available for payment source

## 2023-05-16 ENCOUNTER — Encounter (HOSPITAL_COMMUNITY): Payer: No Typology Code available for payment source

## 2023-05-17 ENCOUNTER — Encounter (HOSPITAL_COMMUNITY): Payer: No Typology Code available for payment source

## 2023-05-19 ENCOUNTER — Encounter (HOSPITAL_COMMUNITY): Payer: No Typology Code available for payment source

## 2023-05-20 ENCOUNTER — Other Ambulatory Visit: Payer: Self-pay | Admitting: Cardiology

## 2023-05-20 DIAGNOSIS — I10 Essential (primary) hypertension: Secondary | ICD-10-CM

## 2023-05-22 ENCOUNTER — Encounter: Payer: Medicare Other | Admitting: Family Medicine

## 2023-05-22 ENCOUNTER — Encounter (HOSPITAL_COMMUNITY): Payer: No Typology Code available for payment source

## 2023-05-24 ENCOUNTER — Encounter (HOSPITAL_COMMUNITY): Payer: No Typology Code available for payment source

## 2023-05-26 ENCOUNTER — Encounter (HOSPITAL_COMMUNITY): Payer: No Typology Code available for payment source

## 2023-05-29 ENCOUNTER — Encounter (HOSPITAL_COMMUNITY): Payer: No Typology Code available for payment source

## 2023-05-31 ENCOUNTER — Encounter (HOSPITAL_COMMUNITY): Payer: No Typology Code available for payment source

## 2023-06-01 ENCOUNTER — Encounter (HOSPITAL_COMMUNITY): Payer: Self-pay | Admitting: *Deleted

## 2023-06-01 DIAGNOSIS — I2089 Other forms of angina pectoris: Secondary | ICD-10-CM

## 2023-06-01 NOTE — Progress Notes (Signed)
Cardiac Individual Treatment Plan  Patient Details  Name: Luis Guzman MRN: [id number] Date of Birth: 1946-07-24 Referring Provider:   Flowsheet Row INTENSIVE CARDIAC REHAB ORIENT from 03/14/2023 in [facility]  Referring Provider Bensimhon, Bevelyn Buckles, MD       Initial Encounter Date:  Flowsheet Row INTENSIVE CARDIAC REHAB ORIENT from 03/14/2023 in [facility]  Date 03/14/23       Visit Diagnosis: Chronic stable angina (HCC)  Patient's Home Medications on Admission:  Current Outpatient Medications:    acetaminophen (TYLENOL) 500 MG tablet, Take 1,000 mg by mouth every 6 (six) hours as needed for moderate pain., Disp: , Rfl:    Alirocumab (PRALUENT) 150 MG/ML SOAJ, Inject 150 mg into the skin every 14 (fourteen) days., Disp: 2 mL, Rfl: 11   amLODipine (NORVASC) 5 MG tablet, Take 1 tablet (5 mg total) by mouth daily., Disp: 90 tablet, Rfl: 3   aspirin EC 81 MG tablet, Take 1 tablet (81 mg total) by mouth daily. Swallow whole. ( start after stopping clopidogrel 75 mg), Disp: 90 tablet, Rfl: 3   bimatoprost (LUMIGAN) 0.03 % ophthalmic solution, Place 1 drop into both eyes at bedtime., Disp: , Rfl:    Brinzolamide-Brimonidine (SIMBRINZA) 1-0.2 % SUSP, Place 1 drop into both eyes in the morning and at bedtime., Disp: , Rfl:    furosemide (LASIX) 20 MG tablet, TAKE 1 TO 2 TABLETS DAILY AS NEEDED FOR SHORTNESS OF BREATH OR WEIGHT GAIN, Disp: 180 tablet, Rfl: 1   ibuprofen (ADVIL) 200 MG tablet, Take 400 mg by mouth every 6 (six) hours as needed for moderate pain., Disp: , Rfl:    irbesartan (AVAPRO) 150 MG tablet, Take 1 tablet (150 mg total) by mouth daily., Disp: 90 tablet, Rfl: 3   isosorbide mononitrate (IMDUR) 60 MG 24 hr tablet, TAKE 1 FULL TABLET (60MG ) IN THE MORNING, AND HALF A TABLET (30MG ) IN THE EVENING (Patient taking differently: 60 mg. TAKE 1 FULL TABLET (90 MG) IN  THE MORNING, AND HALF A TABLET (30MG ) IN THE EVENING per Jari Favre [facility] 05/01/23), Disp: 45 tablet, Rfl: 3   metoprolol succinate (TOPROL-XL) 25 MG 24 hr tablet, Take 0.5 tablets (12.5 mg total) by mouth daily., Disp: 45 tablet, Rfl: 3   nitroGLYCERIN (NITROSTAT) 0.4 MG SL tablet, Place 1 tablet (0.4 mg total) under the tongue every 5 (five) minutes as needed for chest pain., Disp: 25 tablet, Rfl: 6   potassium chloride SA (KLOR-CON M) 10 MEQ tablet, Take 10 mEq ( 1 capsule)  on days that you take lasix., Disp: 90 tablet, Rfl: 3   ranolazine (RANEXA) 500 MG 12 hr tablet, Take 500 mg by mouth 2 (two) times daily., Disp: , Rfl:    traZODone (DESYREL) 50 MG tablet, Take 50 mg by mouth at bedtime., Disp: , Rfl:   Past Medical History: Past Medical History:  Diagnosis Date   Anemia    as infant history of   CAD S/P PTCA & DES PCI - for Progressive Angina 02/26/2019   Cath-PCI 02/26/2019: EF 55-65%. mCx 65% (FFR 0.82 - Med Rx). D2 85% - Wolverine Scoring Balloon PTCA (2.0 mm). Ost rPDA - 90% -Resolute Onyx 2.5 x 15 (2.6 mm). 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 lower back pain, s/p cervical neck surgery"stiffness" remains   Transfusion history    infant "anemia"    Tobacco Use: Social History   Tobacco Use  Smoking Status Former   Current packs/day: 0.00   Average packs/day: 1 pack/day for 5.0 years (5.0 ttl pk-yrs)   Types: Cigarettes   Start date: 10/18/1963   Quit date: 10/17/1968   Years since quitting: 54.6  Smokeless Tobacco Never    Labs: Review Flowsheet   More data exists      Latest Ref Rng & Units 05/18/2021 07/02/2021 05/20/2022 11/14/2022 01/26/2023  Labs for ITP Cardiac and Pulmonary Rehab  Cholestrol 100 - 199 mg/dL 629  528  413  244  -  LDL (calc) 0 - 99 mg/dL 46  63  73  55  -  HDL-C >39 mg/dL 01.02  46  72.53  44  -  Trlycerides 0 - 149 mg/dL 66.4  64  403.4  742  -  Hemoglobin A1c 4.6 - 6.5 % 6.0  - 5.9  - -  Bicarbonate 20.0 - 28.0 mmol/L 20.0 - 28.0 mmol/L - - - - 22.1  23.9   TCO2 22 - 32 mmol/L 22 - 32 mmol/L - - - - 23  25   Acid-base deficit 0.0 - 2.0 mmol/L 0.0 - 2.0 mmol/L - - - - 3.0  1.0   O2 Saturation % % - - - - 72  71     Details       Multiple values from one day are sorted in reverse-chronological order         Capillary Blood Glucose: Lab Results  Component Value Date   GLUCAP 95 03/04/2020     Exercise Target Goals: Exercise Program Goal: Individual exercise prescription set using results from initial 6 min walk test and THRR while considering  patient's activity barriers and safety.   Exercise Prescription Goal: Initial exercise prescription builds to 30-45 minutes a day of aerobic activity, 2-3 days per week.  Home exercise guidelines will be given to patient during program as part of exercise prescription that the participant will acknowledge.  Activity Barriers & Risk Stratification:   6 Minute Walk:   Oxygen Initial Assessment:   Oxygen Re-Evaluation:   Oxygen Discharge (Final Oxygen Re-Evaluation):   Initial Exercise Prescription:   Perform Capillary Blood Glucose checks as needed.  Exercise Prescription Changes:   Exercise Prescription Changes     Row Name 04/14/23 1200 04/21/23 1100 04/26/23 1400         Response to Exercise   Blood Pressure (Admit) 103/69 110/60 120/70     Blood Pressure (Exercise) 138/70 136/70 158/82     Blood Pressure (Exit) 104/60 104/66 114/70     Heart Rate (Admit) 70 bpm 60 bpm 64 bpm     Heart Rate (Exercise) 112 bpm 109 bpm 117 bpm      Heart Rate (Exit) 79 bpm 73 bpm 80 bpm     Rating of Perceived Exertion (Exercise) 12 12 13      Symptoms Chest pressure None None     Comments Reviewed METs Reviewed goals Reviewed METs/Home exercise Rx     Duration  Continue with 30 min of aerobic exercise without signs/symptoms of physical distress. Continue with 30 min of aerobic exercise without signs/symptoms of physical distress. Continue with 30 min of aerobic exercise without signs/symptoms of physical distress.     Intensity THRR unchanged Other (comment) THRR unchanged       Progression   Progression Continue to progress workloads to maintain intensity without signs/symptoms of physical distress. Continue to progress workloads to maintain intensity without signs/symptoms of physical distress. Continue to progress workloads to maintain intensity without signs/symptoms of physical distress.     Average METs 3.45 3.8 3.05       Resistance Training   Training Prescription Yes Yes No     Weight 4 lbs 4 lbs No weights on wednesdays     Reps 10-15 10-15 --     Time 10 Minutes 10 Minutes --       Interval Training   Interval Training No No No       Bike   Level -- -- --     Watts -- -- --     Minutes -- -- --     METs -- -- --       Recumbant Bike   Level 2 2 2      Watts -- 23 --     Minutes 15 15 15      METs 2.4 2.4 2.5       Recumbant Elliptical   Level 1 1 1      RPM 71 80 80     Watts 105 122 117     Minutes 15 15 15      METs 4.5 5.2 5       Home Exercise Plan   Plans to continue exercise at -- -- Home (comment)     Frequency -- -- Add 3 additional days to program exercise sessions.     Initial Home Exercises Provided -- -- 04/26/23              Exercise Comments:   Exercise Comments     Row Name 04/14/23 1208 04/26/23 1122         Exercise Comments Reviewed METs. Pt on to return to CR. Pt is progressing. Instructed to decrease workload/intensity if have sob/pressure. Reviewed METs and Home exercise  Rx. Pt is progressing on workloads. Pt is walking at home 3x/week for 20-25 minutes. Pt verbalized understanding of the home exericse Rx and was provided a copy.               Exercise Goals and Review:   Exercise Goals Re-Evaluation :  Exercise Goals Re-Evaluation     Row Name 04/21/23 1104             Exercise Goal Re-Evaluation   Exercise Goals Review Increase Physical Activity;Increase Strength and Stamina;Able to understand and use rate of perceived exertion (RPE) scale;Knowledge and understanding of Target Heart Rate Range (THRR);Understanding of Exercise Prescription       Comments Reviewed goals. Pt has made progress on his goal of increased exercise time and frequency. He is walking at home 2xweek for 25 minutes with his wife. For pt's goal of less SOB and angina, it is imrpoving since the MD increased his medication.       Expected Outcomes Will continue to increase workloads as tolerated.                Discharge Exercise Prescription (Final Exercise Prescription Changes):  Exercise Prescription Changes - 04/26/23 1400  Response to Exercise   Blood Pressure (Admit) 120/70    Blood Pressure (Exercise) 158/82    Blood Pressure (Exit) 114/70    Heart Rate (Admit) 64 bpm    Heart Rate (Exercise) 117 bpm    Heart Rate (Exit) 80 bpm    Rating of Perceived Exertion (Exercise) 13    Symptoms None    Comments Reviewed METs/Home exercise Rx    Duration Continue with 30 min of aerobic exercise without signs/symptoms of physical distress.    Intensity THRR unchanged      Progression   Progression Continue to progress workloads to maintain intensity without signs/symptoms of physical distress.    Average METs 3.05      Resistance Training   Training Prescription No    Weight No weights on wednesdays      Interval Training   Interval Training No      Bike   Level --    Watts --    Minutes --    METs --      Recumbant Bike   Level 2    Minutes 15     METs 2.5      Recumbant Elliptical   Level 1    RPM 80    Watts 117    Minutes 15    METs 5      Home Exercise Plan   Plans to continue exercise at Home (comment)    Frequency Add 3 additional days to program exercise sessions.    Initial Home Exercises Provided 04/26/23             Nutrition:  Target Goals: Understanding of nutrition guidelines, daily intake of sodium 1500mg , cholesterol 200mg , calories 30% from fat and 7% or less from saturated fats, daily to have 5 or more servings of fruits and vegetables.  Biometrics:    Nutrition Therapy Plan and Nutrition Goals:  Nutrition Therapy & Goals - 05/22/23 1154       Nutrition Therapy   Diet Heart Healthy Diet      Personal Nutrition Goals   Nutrition Goal Patient to identify strategies for reducing cardiovascular risk by attending the [facility] education and nutrition series weekly.   goal in progress.   Personal Goal #2 Patient to improve diet quality by using the plate method as a guide for meal planning to include lean protein/plant protein, fruits, vegetables, whole grains, nonfat dairy as part of a well-balanced diet.   goal in progress.   Personal Goal #3 Patient to limit sodium intake to 1500mg  per day   goal in progress.   Comments Mohamed has not attended intensive cardiac rehab since 9/16; he continues to await further cardiac work-up/clearance. Prior to his absences, Temur continued to attend the [employer] education and nutrition series. He had better understanding of fiber recommendations, strategies for reducing saturated fat, and reading food labels for sodium and sugar. No new labs at this time; his A1c remains in a prediabetic range. His wife is supportive of lifestyle changes. Patient will benefit from participation in intensive cardiac rehab for nutrition, exercise, and lifestyle modification.      Intervention Plan   Intervention Prescribe, educate and counsel regarding individualized specific dietary  modifications aiming towards targeted core components such as weight, hypertension, lipid management, diabetes, heart failure and other comorbidities.;Nutrition handout(s) given to patient.    Expected Outcomes Short Term Goal: Understand basic principles of dietary content, such as calories, fat, sodium, cholesterol and nutrients.;Long Term Goal: Adherence to prescribed nutrition  plan.             Nutrition Assessments:  MEDIFICTS Score Key: >=70 Need to make dietary changes  40-70 Heart Healthy Diet <= 40 Therapeutic Level Cholesterol Diet    Picture Your Plate Scores: <21 Unhealthy dietary pattern with much room for improvement. 41-50 Dietary pattern unlikely to meet recommendations for good health and room for improvement. 51-60 More healthful dietary pattern, with some room for improvement.  >60 Healthy dietary pattern, although there may be some specific behaviors that could be improved.    Nutrition Goals Re-Evaluation:  Nutrition Goals Re-Evaluation     Row Name 04/24/23 0935 05/22/23 1154           Goals   Current Weight 191 lb 5.8 oz (86.8 kg) --      Comment No new labs; per labs through [place] on 11/24/22: triglycerides 162, LDL 67 (goal <70)- patient is statin intolerant, A1c 6.1 No new labs; per labs through [place] on 11/24/22: triglycerides 162, LDL 67 (goal <70)- patient is statin intolerant, A1c 6.1      Expected Outcome Goals in progress. Cylus continues to attend the [employer] and nutrition series. He has better understanding of fiber recommendations, strategies for reducing saturated fat, and reading food labels for sodium and sugar. He has maintained his weight since starting with our program. No new labs at this time; his A1c remains in a prediabetic range. His wife is supportive of lifestyle changes. Patient will benefit from participation in intensive cardiac rehab for nutrition, exercise, and lifestyle modification. Rohen has not attended intensive cardiac  rehab since 9/16; he continues to await further cardiac work-up/clearance. Prior to his absences, Boyan continued to attend the [employer] education and nutrition series. He had better understanding of fiber recommendations, strategies for reducing saturated fat, and reading food labels for sodium and sugar. No new labs at this time; his A1c remains in a prediabetic range. His wife is supportive of lifestyle changes. Patient will benefit from participation in intensive cardiac rehab for nutrition, exercise, and lifestyle modification.               Nutrition Goals Re-Evaluation:  Nutrition Goals Re-Evaluation     Row Name 04/24/23 0935 05/22/23 1154           Goals   Current Weight 191 lb 5.8 oz (86.8 kg) --      Comment No new labs; per labs through [place] on 11/24/22: triglycerides 162, LDL 67 (goal <70)- patient is statin intolerant, A1c 6.1 No new labs; per labs through [place] on 11/24/22: triglycerides 162, LDL 67 (goal <70)- patient is statin intolerant, A1c 6.1      Expected Outcome Goals in progress. Roma continues to attend the [employer] and nutrition series. He has better understanding of fiber recommendations, strategies for reducing saturated fat, and reading food labels for sodium and sugar. He has maintained his weight since starting with our program. No new labs at this time; his A1c remains in a prediabetic range. His wife is supportive of lifestyle changes. Patient will benefit from participation in intensive cardiac rehab for nutrition, exercise, and lifestyle modification. Kayde has not attended intensive cardiac rehab since 9/16; he continues to await further cardiac work-up/clearance. Prior to his absences, Chidozie continued to attend the [employer] education and nutrition series. He had better understanding of fiber recommendations, strategies for reducing saturated fat, and reading food labels for sodium and sugar. No new labs at this time; his A1c remains in a  prediabetic range.  His wife is supportive of lifestyle changes. Patient will benefit from participation in intensive cardiac rehab for nutrition, exercise, and lifestyle modification.               Nutrition Goals Discharge (Final Nutrition Goals Re-Evaluation):  Nutrition Goals Re-Evaluation - 05/22/23 1154       Goals   Comment No new labs; per labs through [place] on 11/24/22: triglycerides 162, LDL 67 (goal <70)- patient is statin intolerant, A1c 6.1    Expected Outcome Gabriella has not attended intensive cardiac rehab since 9/16; he continues to await further cardiac work-up/clearance. Prior to his absences, Wallis continued to attend the [employer] education and nutrition series. He had better understanding of fiber recommendations, strategies for reducing saturated fat, and reading food labels for sodium and sugar. No new labs at this time; his A1c remains in a prediabetic range. His wife is supportive of lifestyle changes. Patient will benefit from participation in intensive cardiac rehab for nutrition, exercise, and lifestyle modification.             Psychosocial: Target Goals: Acknowledge presence or absence of significant depression and/or stress, maximize coping skills, provide positive support system. Participant is able to verbalize types and ability to use techniques and skills needed for reducing stress and depression.  Initial Review & Psychosocial Screening:   Quality of Life Scores:  Scores of 19 and below usually indicate a poorer quality of life in these areas.  A difference of  2-3 points is a clinically meaningful difference.  A difference of 2-3 points in the total score of the Quality of Life Index has been associated with significant improvement in overall quality of life, self-image, physical symptoms, and general health in studies assessing change in quality of life.  PHQ-9: Review Flowsheet  More data exists      03/14/2023 08/22/2022 05/20/2022 05/16/2022 05/07/2021  Depression screen  PHQ 2/9  Decreased Interest 0 0 0 0 0  Down, Depressed, Hopeless 0 0 0 0 0  PHQ - 2 Score 0 0 0 0 0  Altered sleeping 1 - - - -  Tired, decreased energy 1 - - - -  Change in appetite 0 - - - -  Feeling bad or failure about yourself  0 - - - -  Trouble concentrating 0 - - - -  Moving slowly or fidgety/restless 0 - - - -  Suicidal thoughts 0 - - - -  PHQ-9 Score 2 - - - -  Difficult doing work/chores Not difficult at all - - - -    Details           Interpretation of Total Score  Total Score Depression Severity:  1-4 = Minimal depression, 5-9 = Mild depression, 10-14 = Moderate depression, 15-19 = Moderately severe depression, 20-27 = Severe depression   Psychosocial Evaluation and Intervention:   Psychosocial Re-Evaluation:  Psychosocial Re-Evaluation     Row Name 05/09/23 1051 06/01/23 1237           Psychosocial Re-Evaluation   Comments unable to asess as exercise is currently on hold unable to asess as exercise remains  on hold               Psychosocial Discharge (Final Psychosocial Re-Evaluation):  Psychosocial Re-Evaluation - 06/01/23 1237       Psychosocial Re-Evaluation   Comments unable to asess as exercise remains  on hold             Vocational  Rehabilitation: Provide vocational rehab assistance to qualifying candidates.   Vocational Rehab Evaluation & Intervention:   Education: Education Goals: Education classes will be provided on a weekly basis, covering required topics. Participant will state understanding/return demonstration of topics presented.    Education     Row Name 04/14/23 0800     Education   Cardiac Education Topics [facility]   Nurse, children's Exercise Physiologist   Select Psychosocial   Psychosocial Healthy Minds, Bodies, Hearts   Instruction Review Code 1- Verbalizes Understanding   Class Start Time 0813   Class Stop Time 0845   Class Time Calculation (min) 32 min    Row Name  04/19/23 1000     Education   Cardiac Education Topics [facility]rit[facility]in   Secondary school teacher School   Educator Dietitian   Weekly Topic Personalizing Your Pritikin Plate   Instruction Review Code 1- Verbalizes Understanding   Class Start Time 0815   Class Stop Time 0848   Class Time Calculation (min) 33 min    Row Name 04/21/23 0900     Education   Cardiac Education Topics Pritikin   [employer] Workshops     Workshops   Educator Exercise Physiologist   Select Exercise   Exercise Workshop Location manager and Fall Prevention   Instruction Review Code 1- Verbalizes Understanding   Class Start Time [phone]   Class Stop Time 0856   Class Time Calculation (min) 45 min    Row Name 04/26/23 1100     Education   Cardiac Education Topics Pritikin   Customer service manager   Weekly Topic [employer] Desserts   Instruction Review Code 1- Verbalizes Understanding   Class Start Time 0820   Class Stop Time 0900   Class Time Calculation (min) 40 min    Row Name 04/28/23 1000     Education   Cardiac Education Topics Pritikin   Licensed conveyancer Nutrition   Nutrition Other   Instruction Review Code 1- Verbalizes Understanding   Class Start Time 0815   Class Stop Time 0900   Class Time Calculation (min) 45 min    Row Name 05/01/23 1100     Education   Cardiac Education Topics [facility]in   [employer]     Workshops   Educator Exercise Physiologist   Select Psychosocial   Psychosocial Workshop Recognizing and Reducing Stress   Instruction Review Code 1- Verbalizes Understanding   Class Start Time 0815   Class Stop Time 0900   Class Time Calculation (min) 45 min            Core Videos: Exercise    Move It!  Clinical staff conducted group or individual video education with verbal and written material and guidebook.  Patient learns the recommended Prit[facility]in exercise  program. Exercise with the goal of living a long, healthy life. Some of the health benefits of exercise include controlled diabetes, healthier blood pressure levels, improved cholesterol levels, improved heart and lung capacity, improved sleep, and better body composition. Everyone should speak with their doctor before starting or changing an exercise routine.  Biomechanical Limitations Clinical staff conducted group or individual video education with verbal and written material and guidebook.  Patient learns how biomechanical limitations can impact exercise and how we can mitigate and possibly overcome limitations  to have an impactful and balanced exercise routine.  Body Composition Clinical staff conducted group or individual video education with verbal and written material and guidebook.  Patient learns that body composition (ratio of muscle mass to fat mass) is a key component to assessing overall fitness, rather than body weight alone. Increased fat mass, especially visceral belly fat, can put [place] at increased risk for metabolic syndrome, type 2 diabetes, heart disease, and even death. It is recommended to combine diet and exercise (cardiovascular and resistance training) to improve your body composition. Seek guidance from your physician and exercise physiologist before implementing an exercise routine.  Exercise Action Plan Clinical staff conducted group or individual video education with verbal and written material and guidebook.  Patient learns the recommended strategies to achieve and enjoy long-term exercise adherence, including variety, self-motivation, self-efficacy, and positive decision making. Benefits of exercise include fitness, good health, weight management, more energy, better sleep, less stress, and overall well-being.  Medical   Heart Disease Risk Reduction Clinical staff conducted group or individual video education with verbal and written material and guidebook.  Patient  learns our heart is our most vital organ as it circulates oxygen, nutrients, white blood cells, and hormones throughout the entire body, and carries waste away. Data supports a plant-based eating plan like the Pritikin Program for its effectiveness in slowing progression of and reversing heart disease. The video provides a number of recommendations to address heart disease.   Metabolic Syndrome and Belly Fat  Clinical staff conducted group or individual video education with verbal and written material and guidebook.  Patient learns what metabolic syndrome is, how it leads to heart disease, and how one can reverse it and keep it from coming back. You have metabolic syndrome if you have 3 of the following 5 criteria: abdominal obesity, high blood pressure, high triglycerides, low HDL cholesterol, and high blood sugar.  Hypertension and Heart Disease Clinical staff conducted group or individual video education with verbal and written material and guidebook.  Patient learns that high blood pressure, or hypertension, is very common in the [place]. Hypertension is largely due to excessive salt intake, but other important risk factors include being overweight, physical inactivity, drinking too much alcohol, smoking, and not eating enough potassium from fruits and vegetables. High blood pressure is a leading risk factor for heart attack, stroke, congestive heart failure, dementia, kidney failure, and premature death. Long-term effects of excessive salt intake include stiffening of the arteries and thickening of heart muscle and organ damage. Recommendations include ways to reduce hypertension and the risk of heart disease.  Diseases of Our Time - Focusing on Diabetes Clinical staff conducted group or individual video education with verbal and written material and guidebook.  Patient learns why the best way to stop diseases of our time is prevention, through food and other lifestyle changes. Medicine (such  as prescription pills and surgeries) is often only a Band-Aid on the problem, not a long-term solution. Most common diseases of our time include obesity, type 2 diabetes, hypertension, heart disease, and cancer. The Pritikin Program is recommended and has been proven to help reduce, reverse, and/or prevent the damaging effects of metabolic syndrome.  Nutrition   Overview of the Pritikin Eating Plan  Clinical staff conducted group or individual video education with verbal and written material and guidebook.  Patient learns about the Pritikin Eating Plan for disease risk reduction. The Pritikin Eating Plan emphasizes a wide variety of unrefined, minimally-processed carbohydrates, like fruits, vegetables, whole grains,  and legumes. Go, Caution, and Stop food choices are explained. Plant-based and lean animal proteins are emphasized. Rationale provided for low sodium intake for blood pressure control, low added sugars for blood sugar stabilization, and low added fats and oils for coronary artery disease risk reduction and weight management.  Calorie Density  Clinical staff conducted group or individual video education with verbal and written material and guidebook.  Patient learns about calorie density and how it impacts the Pritikin Eating Plan. Knowing the characteristics of the food you choose will help you decide whether those foods will lead to weight gain or weight loss, and whether you want to consume more or less of them. Weight loss is usually a side effect of the Pritikin Eating Plan because of its focus on low calorie-dense foods.  Label Reading  Clinical staff conducted group or individual video education with verbal and written material and guidebook.  Patient learns about the Pritikin recommended label reading guidelines and corresponding recommendations regarding calorie density, added sugars, sodium content, and whole grains.  Dining Out - Part 1  Clinical staff conducted group or  individual video education with verbal and written material and guidebook.  Patient learns that restaurant meals can be sabotaging because they can be so high in calories, fat, sodium, and/or sugar. Patient learns recommended strategies on how to positively address this and avoid unhealthy pitfalls.  Facts on Fats  Clinical staff conducted group or individual video education with verbal and written material and guidebook.  Patient learns that lifestyle modifications can be just as effective, if not more so, as many medications for lowering your risk of heart disease. A Pritikin lifestyle can help to reduce your risk of inflammation and atherosclerosis (cholesterol build-up, or plaque, in the artery walls). Lifestyle interventions such as dietary choices and physical activity address the cause of atherosclerosis. A review of the types of fats and their impact on blood cholesterol levels, along with dietary recommendations to reduce fat intake is also included.  Nutrition Action Plan  Clinical staff conducted group or individual video education with verbal and written material and guidebook.  Patient learns how to incorporate Pritikin recommendations into their lifestyle. Recommendations include planning and keeping personal health goals in mind as an important part of their success.  Healthy Mind-Set    Healthy Minds, Bodies, Hearts  Clinical staff conducted group or individual video education with verbal and written material and guidebook.  Patient learns how to identify when they are stressed. Video will discuss the impact of that stress, as well as the many benefits of stress management. Patient will also be introduced to stress management techniques. The way we think, act, and feel has an impact on our hearts.  How Our Thoughts Can Heal Our Hearts  Clinical staff conducted group or individual video education with verbal and written material and guidebook.  Patient learns that negative thoughts  can cause depression and anxiety. This can result in negative lifestyle behavior and serious health problems. Cognitive behavioral therapy is an effective method to help control our thoughts in order to change and improve our emotional outlook.  Additional Videos:  Exercise    Improving Performance  Clinical staff conducted group or individual video education with verbal and written material and guidebook.  Patient learns to use a non-linear approach by alternating intensity levels and lengths of time spent exercising to help burn more calories and lose more body fat. Cardiovascular exercise helps improve heart health, metabolism, hormonal balance, blood sugar control, and recovery  from fatigue. Resistance training improves strength, endurance, balance, coordination, reaction time, metabolism, and muscle mass. Flexibility exercise improves circulation, posture, and balance. Seek guidance from your physician and exercise physiologist before implementing an exercise routine and learn your capabilities and proper form for all exercise.  Introduction to Yoga  Clinical staff conducted group or individual video education with verbal and written material and guidebook.  Patient learns about yoga, a discipline of the coming together of mind, breath, and body. The benefits of yoga include improved flexibility, improved range of motion, better posture and core strength, increased lung function, weight loss, and positive self-image. Yoga's heart health benefits include lowered blood pressure, healthier heart rate, decreased cholesterol and triglyceride levels, improved immune function, and reduced stress. Seek guidance from your physician and exercise physiologist before implementing an exercise routine and learn your capabilities and proper form for all exercise.  Medical   Aging: Enhancing Your Quality of Life  Clinical staff conducted group or individual video education with verbal and written material and  guidebook.  Patient learns key strategies and recommendations to stay in good physical health and enhance quality of life, such as prevention strategies, having an advocate, securing a Health Care Proxy and Power of Attorney, and keeping a list of medications and system for tracking them. It also discusses how to avoid risk for bone loss.  Biology of Weight Control  Clinical staff conducted group or individual video education with verbal and written material and guidebook.  Patient learns that weight gain occurs because we consume more calories than we burn (eating more, moving less). Even if your body weight is normal, you may have higher ratios of fat compared to muscle mass. Too much body fat puts you at increased risk for cardiovascular disease, heart attack, stroke, type 2 diabetes, and obesity-related cancers. In addition to exercise, following the Pritikin Eating Plan can help reduce your risk.  Decoding Lab Results  Clinical staff conducted group or individual video education with verbal and written material and guidebook.  Patient learns that lab test reflects one measurement whose values change over time and are influenced by many factors, including medication, stress, sleep, exercise, food, hydration, pre-existing medical conditions, and more. It is recommended to use the knowledge from this video to become more involved with your lab results and evaluate your numbers to speak with your doctor.   Diseases of Our Time - Overview  Clinical staff conducted group or individual video education with verbal and written material and guidebook.  Patient learns that according to the CDC, 50% to 70% of chronic diseases (such as obesity, type 2 diabetes, elevated lipids, hypertension, and heart disease) are avoidable through lifestyle improvements including healthier food choices, listening to satiety cues, and increased physical activity.  Sleep Disorders Clinical staff conducted group or individual  video education with verbal and written material and guidebook.  Patient learns how good quality and duration of sleep are important to overall health and well-being. Patient also learns about sleep disorders and how they impact health along with recommendations to address them, including discussing with a physician.  Nutrition  Dining Out - Part 2 Clinical staff conducted group or individual video education with verbal and written material and guidebook.  Patient learns how to plan ahead and communicate in order to maximize their dining experience in a healthy and nutritious manner. Included are recommended food choices based on the type of restaurant the patient is visiting.   Fueling a Banker conducted group  or individual video education with verbal and written material and guidebook.  There is a strong connection between our food choices and our health. Diseases like obesity and type 2 diabetes are very prevalent and are in large-part due to lifestyle choices. The Pritikin Eating Plan provides plenty of food and hunger-curbing satisfaction. It is easy to follow, affordable, and helps reduce health risks.  Menu Workshop  Clinical staff conducted group or individual video education with verbal and written material and guidebook.  Patient learns that restaurant meals can sabotage health goals because they are often packed with calories, fat, sodium, and sugar. Recommendations include strategies to plan ahead and to communicate with the manager, chef, or server to help order a healthier meal.  Planning Your Eating Strategy  Clinical staff conducted group or individual video education with verbal and written material and guidebook.  Patient learns about the Pritikin Eating Plan and its benefit of reducing the risk of disease. The Pritikin Eating Plan does not focus on calories. Instead, it emphasizes high-quality, nutrient-rich foods. By knowing the characteristics of the  foods, we choose, we can determine their calorie density and make informed decisions.  Targeting Your Nutrition Priorities  Clinical staff conducted group or individual video education with verbal and written material and guidebook.  Patient learns that lifestyle habits have a tremendous impact on disease risk and progression. This video provides eating and physical activity recommendations based on your personal health goals, such as reducing LDL cholesterol, losing weight, preventing or controlling type 2 diabetes, and reducing high blood pressure.  Vitamins and Minerals  Clinical staff conducted group or individual video education with verbal and written material and guidebook.  Patient learns different ways to obtain key vitamins and minerals, including through a recommended healthy diet. It is important to discuss all supplements you take with your doctor.   Healthy Mind-Set    Smoking Cessation  Clinical staff conducted group or individual video education with verbal and written material and guidebook.  Patient learns that cigarette smoking and tobacco addiction pose a serious health risk which affects millions of people. Stopping smoking will significantly reduce the risk of heart disease, lung disease, and many forms of cancer. Recommended strategies for quitting are covered, including working with your doctor to develop a successful plan.  Culinary   Becoming a Set designer conducted group or individual video education with verbal and written material and guidebook.  Patient learns that cooking at home can be healthy, cost-effective, quick, and puts them in control. Keys to cooking healthy recipes will include looking at your recipe, assessing your equipment needs, planning ahead, making it simple, choosing cost-effective seasonal ingredients, and limiting the use of added fats, salts, and sugars.  Cooking - Breakfast and Snacks  Clinical staff conducted group or  individual video education with verbal and written material and guidebook.  Patient learns how important breakfast is to satiety and nutrition through the entire day. Recommendations include key foods to eat during breakfast to help stabilize blood sugar levels and to prevent overeating at meals later in the day. Planning ahead is also a key component.  Cooking - Educational psychologist conducted group or individual video education with verbal and written material and guidebook.  Patient learns eating strategies to improve overall health, including an approach to cook more at home. Recommendations include thinking of animal protein as a side on your plate rather than center stage and focusing instead on lower calorie dense options like  vegetables, fruits, whole grains, and plant-based proteins, such as beans. Making sauces in large quantities to freeze for later and leaving the skin on your vegetables are also recommended to maximize your experience.  Cooking - Healthy Salads and Dressing Clinical staff conducted group or individual video education with verbal and written material and guidebook.  Patient learns that vegetables, fruits, whole grains, and legumes are the foundations of the Pritikin Eating Plan. Recommendations include how to incorporate each of these in flavorful and healthy salads, and how to create homemade salad dressings. Proper handling of ingredients is also covered. Cooking - Soups and [employer] - Soups and Desserts Clinical staff conducted group or individual video education with verbal and written material and guidebook.  Patient learns that Pritikin soups and desserts make for easy, nutritious, and delicious snacks and meal components that are low in sodium, fat, sugar, and calorie density, while high in vitamins, minerals, and filling fiber. Recommendations include simple and healthy ideas for soups and desserts.   Overview     The Pritikin Solution Program  Overview Clinical staff conducted group or individual video education with verbal and written material and guidebook.  Patient learns that the results of the Pritikin Program have been documented in more than 100 articles published in peer-reviewed journals, and the benefits include reducing risk factors for (and, in some cases, even reversing) high cholesterol, high blood pressure, type 2 diabetes, obesity, and more! An overview of the three key pillars of the Pritikin Program will be covered: eating well, doing regular exercise, and having a healthy mind-set.  WORKSHOPS  Exercise: Exercise Basics: Building Your Action Plan Clinical staff led group instruction and group discussion with PowerPoint presentation and patient guidebook. To enhance the learning environment the use of posters, models and videos may be added. At the conclusion of this workshop, patients will comprehend the difference between physical activity and exercise, as well as the benefits of incorporating both, into their routine. Patients will understand the FITT (Frequency, Intensity, Time, and Type) principle and how to use it to build an exercise action plan. In addition, safety concerns and other considerations for exercise and cardiac rehab will be addressed by the presenter. The purpose of this lesson is to promote a comprehensive and effective weekly exercise routine in order to improve patients' overall level of fitness.   Managing Heart Disease: Your Path to a Healthier Heart Clinical staff led group instruction and group discussion with PowerPoint presentation and patient guidebook. To enhance the learning environment the use of posters, models and videos may be added.At the conclusion of this workshop, patients will understand the anatomy and physiology of the heart. Additionally, they will understand how Pritikin's three pillars impact the risk factors, the progression, and the management of heart disease.  The  purpose of this lesson is to provide a high-level overview of the heart, heart disease, and how the Pritikin lifestyle positively impacts risk factors.  Exercise Biomechanics Clinical staff led group instruction and group discussion with PowerPoint presentation and patient guidebook. To enhance the learning environment the use of posters, models and videos may be added. Patients will learn how the structural parts of their bodies function and how these functions impact their daily activities, movement, and exercise. Patients will learn how to promote a neutral spine, learn how to manage pain, and identify ways to improve their physical movement in order to promote healthy living. The purpose of this lesson is to expose patients to common physical limitations that impact  physical activity. Participants will learn practical ways to adapt and manage aches and pains, and to minimize their effect on regular exercise. Patients will learn how to maintain good posture while sitting, walking, and lifting.  Balance Training and Fall Prevention  Clinical staff led group instruction and group discussion with PowerPoint presentation and patient guidebook. To enhance the learning environment the use of posters, models and videos may be added. At the conclusion of this workshop, patients will understand the importance of their sensorimotor skills (vision, proprioception, and the vestibular system) in maintaining their ability to balance as they age. Patients will apply a variety of balancing exercises that are appropriate for their current level of function. Patients will understand the common causes for poor balance, possible solutions to these problems, and ways to modify their physical environment in order to minimize their fall risk. The purpose of this lesson is to teach patients about the importance of maintaining balance as they age and ways to minimize their risk of falling.  WORKSHOPS   Nutrition:   Fueling a Ship broker led group instruction and group discussion with PowerPoint presentation and patient guidebook. To enhance the learning environment the use of posters, models and videos may be added. Patients will review the foundational principles of the Pritikin Eating Plan and understand what constitutes a serving size in each of the food groups. Patients will also learn Pritikin-friendly foods that are better choices when away from home and review make-ahead meal and snack options. Calorie density will be reviewed and applied to three nutrition priorities: weight maintenance, weight loss, and weight gain. The purpose of this lesson is to reinforce (in a group setting) the key concepts around what patients are recommended to eat and how to apply these guidelines when away from home by planning and selecting Pritikin-friendly options. Patients will understand how calorie density may be adjusted for different weight management goals.  Mindful Eating  Clinical staff led group instruction and group discussion with PowerPoint presentation and patient guidebook. To enhance the learning environment the use of posters, models and videos may be added. Patients will briefly review the concepts of the Pritikin Eating Plan and the importance of low-calorie dense foods. The concept of mindful eating will be introduced as well as the importance of paying attention to internal hunger signals. Triggers for non-hunger eating and techniques for dealing with triggers will be explored. The purpose of this lesson is to provide patients with the opportunity to review the basic principles of the Pritikin Eating Plan, discuss the value of eating mindfully and how to measure internal cues of hunger and fullness using the Hunger Scale. Patients will also discuss reasons for non-hunger eating and learn strategies to use for controlling emotional eating.  Targeting Your Nutrition Priorities Clinical staff led  group instruction and group discussion with PowerPoint presentation and patient guidebook. To enhance the learning environment the use of posters, models and videos may be added. Patients will learn how to determine their genetic susceptibility to disease by reviewing their family history. Patients will gain insight into the importance of diet as part of an overall healthy lifestyle in mitigating the impact of genetics and other environmental insults. The purpose of this lesson is to provide patients with the opportunity to assess their personal nutrition priorities by looking at their family history, their own health history and current risk factors. Patients will also be able to discuss ways of prioritizing and modifying the Pritikin Eating Plan for their highest risk areas  Menu  Clinical staff led group instruction and group discussion with PowerPoint presentation and patient guidebook. To enhance the learning environment the use of posters, models and videos may be added. Using menus brought in from [employer], or printed from [employer], patients will apply the Pritikin dining out guidelines that were presented in the [employer] video. Patients will also be able to practice these guidelines in a variety of provided scenarios. The purpose of this lesson is to provide patients with the opportunity to practice hands-on learning of the Pritikin Dining Out guidelines with actual menus and practice scenarios.  Label Reading Clinical staff led group instruction and group discussion with PowerPoint presentation and patient guidebook. To enhance the learning environment the use of posters, models and videos may be added. Patients will review and discuss the Pritikin label reading guidelines presented in Pritikin's Label Reading Educational series video. Using fool labels brought in from local grocery stores and markets, patients will apply the label reading guidelines and determine  if the packaged food meet the Pritikin guidelines. The purpose of this lesson is to provide patients with the opportunity to review, discuss, and practice hands-on learning of the Pritikin Label Reading guidelines with actual packaged food labels. Cooking School  Pritikin's [employer] are designed to teach patients ways to prepare quick, simple, and affordable recipes at home. The importance of nutrition's role in chronic disease risk reduction is reflected in its emphasis in the overall Pritikin program. By learning how to prepare essential core Pritikin Eating Plan recipes, patients will increase control over what they eat; be able to customize the flavor of foods without the use of added salt, sugar, or fat; and improve the quality of the food they consume. By learning a set of core recipes which are easily assembled, quickly prepared, and affordable, patients are more likely to prepare more healthy foods at home. These workshops focus on convenient breakfasts, simple entres, side dishes, and desserts which can be prepared with minimal effort and are consistent with nutrition recommendations for cardiovascular risk reduction. Cooking [employer] are taught by a Armed forces logistics/support/administrative officer (RD) who has been trained by the [employer]. The chef or RD has a clear understanding of the importance of minimizing - if not completely eliminating - added fat, sugar, and sodium in recipes. Throughout the series of Cooking School Workshop sessions, patients will learn about healthy ingredients and efficient methods of cooking to build confidence in their capability to prepare    Cooking School weekly topics:  Adding Flavor- Sodium-Free  Fast and Healthy Breakfasts  Powerhouse Plant-Based Proteins  Satisfying Salads and Dressings  Simple Sides and Sauces  International Cuisine-Spotlight on the [employer] Zones  Delicious Desserts  Savory Soups  [employer] - Meals in a  Astronomer Appetizers and Snacks  Comforting Weekend Breakfasts  One-Pot Wonders   Fast Evening Meals  Landscape architect [employer]  WORKSHOPS   Healthy Mindset (Psychosocial):  Focused Goals, Sustainable Changes Clinical staff led group instruction and group discussion with PowerPoint presentation and patient guidebook. To enhance the learning environment the use of posters, models and videos may be added. Patients will be able to apply effective goal setting strategies to establish at least one personal goal, and then take consistent, meaningful action toward that goal. They will learn to identify common barriers to achieving personal goals and develop strategies to overcome them. Patients will also gain an understanding of how our  mind-set can impact our ability to achieve goals and the importance of cultivating a positive and growth-oriented mind-set. The purpose of this lesson is to provide patients with a deeper understanding of how to set and achieve personal goals, as well as the tools and strategies needed to overcome common obstacles which may arise along the way.  From Head to Heart: The Power of a Healthy Outlook  Clinical staff led group instruction and group discussion with PowerPoint presentation and patient guidebook. To enhance the learning environment the use of posters, models and videos may be added. Patients will be able to recognize and describe the impact of emotions and mood on physical health. They will discover the importance of self-care and explore self-care practices which may work for them. Patients will also learn how to utilize the 4 C's to cultivate a healthier outlook and better manage stress and challenges. The purpose of this lesson is to demonstrate to patients how a healthy outlook is an essential part of maintaining good health, especially as they continue their cardiac rehab journey.  Healthy Sleep for a Healthy Heart Clinical staff  led group instruction and group discussion with PowerPoint presentation and patient guidebook. To enhance the learning environment the use of posters, models and videos may be added. At the conclusion of this workshop, patients will be able to demonstrate knowledge of the importance of sleep to overall health, well-being, and quality of life. They will understand the symptoms of, and treatments for, common sleep disorders. Patients will also be able to identify daytime and nighttime behaviors which impact sleep, and they will be able to apply these tools to help manage sleep-related challenges. The purpose of this lesson is to provide patients with a general overview of sleep and outline the importance of quality sleep. Patients will learn about a few of the most common sleep disorders. Patients will also be introduced to the concept of "sleep hygiene," and discover ways to self-manage certain sleeping problems through simple daily behavior changes. Finally, the workshop will motivate patients by clarifying the links between quality sleep and their goals of heart-healthy living.   Recognizing and Reducing Stress Clinical staff led group instruction and group discussion with PowerPoint presentation and patient guidebook. To enhance the learning environment the use of posters, models and videos may be added. At the conclusion of this workshop, patients will be able to understand the types of stress reactions, differentiate between acute and chronic stress, and recognize the impact that chronic stress has on their health. They will also be able to apply different coping mechanisms, such as reframing negative self-talk. Patients will have the opportunity to practice a variety of stress management techniques, such as deep abdominal breathing, progressive muscle relaxation, and/or guided imagery.  The purpose of this lesson is to educate patients on the role of stress in their lives and to provide healthy techniques  for coping with it.  Learning Barriers/Preferences:   Education Topics:  Knowledge Questionnaire Score:   Core Components/Risk Factors/Patient Goals at Admission:   Core Components/Risk Factors/Patient Goals Review:   Goals and Risk Factor Review     Row Name 05/09/23 1051 06/01/23 1237           Core Components/Risk Factors/Patient Goals Review   Personal Goals Review Weight Management/Obesity;Hypertension;Lipids Weight Management/Obesity;Hypertension;Lipids      Review Exercise is currently on hold due to ongoing chest pressure and shortness of breath during exercise at cardiac rehab. Exercise remains on hold per Dr Herbie Baltimore.  Expected Outcomes [place] will continue to participate in cardiac rehab for exercise, nutrition and lifestyle modifications when cleared to return to exercise. [place] will continue to participate in cardiac rehab for exercise, nutrition and lifestyle modifications when cleared to return to exercise.               Core Components/Risk Factors/Patient Goals at Discharge (Final Review):   Goals and Risk Factor Review - 06/01/23 1237       Core Components/Risk Factors/Patient Goals Review   Personal Goals Review Weight Management/Obesity;Hypertension;Lipids    Review Exercise remains on hold per Dr Herbie Baltimore.    Expected Outcomes [place] will continue to participate in cardiac rehab for exercise, nutrition and lifestyle modifications when cleared to return to exercise.             ITP Comments:  ITP Comments     Row Name 04/11/23 1102 05/09/23 1049 06/01/23 1236       ITP Comments 30 Day ITP Review. Cardiac Rehab is currently on hold due to ongoing Angina requiring sublingual nitroglycerin. Cyson has an appointment with the heart failure clinic on 04/12/23. 30 Day ITP Review. Cardiac Rehab is currently on hold due to ongoing Angina requiring sublingual nitroglycerin per Dr Herbie Baltimore. Ulis is to have a PET scan than follow up with Dr Herbie Baltimore.  Appointmrent with Dr Herbie Baltimore is on 06/07/23. 30 Day ITP Review. Cardiac Rehab is remains on hold.  Yeremy is to have a PET scan than follow up with Dr Herbie Baltimore. Appointmrent with Dr Herbie Baltimore is on 06/07/23.              Comments: See ITP comments. Exercise remains on hold.Thayer Headings RN BSN

## 2023-06-02 ENCOUNTER — Encounter (HOSPITAL_COMMUNITY): Payer: No Typology Code available for payment source

## 2023-06-05 ENCOUNTER — Telehealth: Payer: Self-pay | Admitting: *Deleted

## 2023-06-05 ENCOUNTER — Encounter (HOSPITAL_COMMUNITY): Payer: No Typology Code available for payment source

## 2023-06-05 ENCOUNTER — Telehealth: Payer: Medicare Other

## 2023-06-05 ENCOUNTER — Encounter (HOSPITAL_COMMUNITY): Payer: Self-pay

## 2023-06-05 NOTE — Telephone Encounter (Signed)
Called patient -  patient is due to have PET SCAN on 06/07/23. RN informed patient  it would be best if PET SCAN is completed prior to appointment with Dr Herbie Baltimore.   Patient  verbalized understanding . He stated he would like call back to confirm appointment , he needs to discuss with   wife about another  prior commitment   Rn informed patient will call back later today

## 2023-06-06 ENCOUNTER — Ambulatory Visit: Payer: No Typology Code available for payment source | Admitting: Cardiology

## 2023-06-07 ENCOUNTER — Ambulatory Visit (HOSPITAL_COMMUNITY)
Admission: RE | Admit: 2023-06-07 | Discharge: 2023-06-07 | Disposition: A | Discharge status: Home / Self Care | Payer: No Typology Code available for payment source | Source: Ambulatory Visit | Attending: Cardiology | Admitting: Cardiology

## 2023-06-07 ENCOUNTER — Encounter (HOSPITAL_COMMUNITY): Payer: No Typology Code available for payment source

## 2023-06-07 DIAGNOSIS — I2089 Other forms of angina pectoris: Secondary | ICD-10-CM | POA: Diagnosis present

## 2023-06-07 DIAGNOSIS — I2510 Atherosclerotic heart disease of native coronary artery without angina pectoris: Secondary | ICD-10-CM | POA: Insufficient documentation

## 2023-06-07 DIAGNOSIS — Z9861 Coronary angioplasty status: Secondary | ICD-10-CM | POA: Insufficient documentation

## 2023-06-07 LAB — NM PET CT CARDIAC PERFUSION MULTI W/ABSOLUTE BLOODFLOW
LV dias vol: 115 mL (ref 62–150)
LV sys vol: 43 mL
MBFR: 2.17
Nuc Rest EF: 63 %
Nuc Stress EF: 61 %
Rest MBF: 1.15 ml/g/min
Rest Nuclear Isotope Dose: 22.5 mCi
ST Depression (mm): 0 mm
Stress MBF: 2.5 ml/g/min
Stress Nuclear Isotope Dose: 22.5 mCi
TID: 0.94

## 2023-06-07 MED ORDER — RUBIDIUM RB82 GENERATOR (RUBYFILL)
22.5000 | PACK | Freq: Once | INTRAVENOUS | Status: AC
  Administered 2023-06-07: 22.5 via INTRAVENOUS

## 2023-06-07 MED ORDER — REGADENOSON 0.4 MG/5ML IV SOLN
INTRAVENOUS | Status: AC
  Filled 2023-06-07: qty 5

## 2023-06-07 MED ORDER — REGADENOSON 0.4 MG/5ML IV SOLN
0.4000 mg | Freq: Once | INTRAVENOUS | Status: AC
  Administered 2023-06-07: 0.4 mg via INTRAVENOUS

## 2023-06-07 NOTE — Progress Notes (Signed)
Patient presents for a cardiac PET stress test and tolerated procedure without incident. Patient maintained acceptable vital signs throughout the test and was offered caffeine after test.  Patient ambulated out of department with a steady gait.  

## 2023-06-09 ENCOUNTER — Ambulatory Visit (HOSPITAL_COMMUNITY): Payer: Medicare Other

## 2023-06-12 ENCOUNTER — Encounter (HOSPITAL_COMMUNITY): Payer: Medicare Other | Admitting: Internal Medicine

## 2023-06-12 ENCOUNTER — Ambulatory Visit (HOSPITAL_COMMUNITY): Payer: Medicare Other

## 2023-06-13 ENCOUNTER — Ambulatory Visit (INDEPENDENT_AMBULATORY_CARE_PROVIDER_SITE_OTHER): Payer: Medicare Other | Admitting: Family Medicine

## 2023-06-13 ENCOUNTER — Encounter: Payer: Self-pay | Admitting: Family Medicine

## 2023-06-13 VITALS — BP 108/60 | HR 63 | Temp 97.9°F | Ht 72.0 in | Wt 193.8 lb

## 2023-06-13 DIAGNOSIS — R739 Hyperglycemia, unspecified: Secondary | ICD-10-CM | POA: Diagnosis not present

## 2023-06-13 DIAGNOSIS — R0600 Dyspnea, unspecified: Secondary | ICD-10-CM | POA: Diagnosis not present

## 2023-06-13 DIAGNOSIS — I10 Essential (primary) hypertension: Secondary | ICD-10-CM

## 2023-06-13 DIAGNOSIS — Z0000 Encounter for general adult medical examination without abnormal findings: Secondary | ICD-10-CM | POA: Diagnosis not present

## 2023-06-13 DIAGNOSIS — E785 Hyperlipidemia, unspecified: Secondary | ICD-10-CM

## 2023-06-13 LAB — CBC WITH DIFFERENTIAL/PLATELET
Basophils Absolute: 0.1 10*3/uL (ref 0.0–0.1)
Basophils Relative: 1.7 % (ref 0.0–3.0)
Eosinophils Absolute: 0.1 10*3/uL (ref 0.0–0.7)
Eosinophils Relative: 1.4 % (ref 0.0–5.0)
HCT: 45.1 % (ref 39.0–52.0)
Hemoglobin: 15.1 g/dL (ref 13.0–17.0)
Lymphocytes Relative: 27.6 % (ref 12.0–46.0)
Lymphs Abs: 2.3 10*3/uL (ref 0.7–4.0)
MCHC: 33.4 g/dL (ref 30.0–36.0)
MCV: 94.3 fL (ref 78.0–100.0)
Monocytes Absolute: 0.8 10*3/uL (ref 0.1–1.0)
Monocytes Relative: 9.5 % (ref 3.0–12.0)
Neutro Abs: 5 10*3/uL (ref 1.4–7.7)
Neutrophils Relative %: 59.8 % (ref 43.0–77.0)
Platelets: 246 10*3/uL (ref 150.0–400.0)
RBC: 4.79 Mil/uL (ref 4.22–5.81)
RDW: 13 % (ref 11.5–15.5)
WBC: 8.3 10*3/uL (ref 4.0–10.5)

## 2023-06-13 LAB — LIPID PANEL
Cholesterol: 130 mg/dL (ref 0–200)
HDL: 40.6 mg/dL (ref >39.00)
LDL Cholesterol: 52 mg/dL (ref 0–99)
NonHDL: 89.53
Total CHOL/HDL Ratio: 3
Triglycerides: 188 mg/dL — ABNORMAL HIGH (ref 0.0–149.0)
VLDL: 37.6 mg/dL (ref 0.0–40.0)

## 2023-06-13 LAB — COMPREHENSIVE METABOLIC PANEL
ALT: 21 U/L (ref 0–53)
AST: 24 U/L (ref 0–37)
Albumin: 4.2 g/dL (ref 3.5–5.2)
Alkaline Phosphatase: 113 U/L (ref 39–117)
BUN: 12 mg/dL (ref 6–23)
CO2: 28 meq/L (ref 19–32)
Calcium: 9.1 mg/dL (ref 8.4–10.5)
Chloride: 105 meq/L (ref 96–112)
Creatinine, Ser: 1.04 mg/dL (ref 0.40–1.50)
GFR: 69.4 mL/min (ref >60.00)
Glucose, Bld: 115 mg/dL — ABNORMAL HIGH (ref 70–99)
Potassium: 3.4 meq/L — ABNORMAL LOW (ref 3.5–5.1)
Sodium: 143 meq/L (ref 135–145)
Total Bilirubin: 0.5 mg/dL (ref 0.2–1.2)
Total Protein: 6.8 g/dL (ref 6.0–8.3)

## 2023-06-13 LAB — HEMOGLOBIN A1C: Hgb A1c MFr Bld: 5.8 % (ref 4.6–6.5)

## 2023-06-13 MED ORDER — TRAZODONE HCL 50 MG PO TABS: ORAL_TABLET | ORAL | 3 refills | Status: DC

## 2023-06-13 NOTE — Progress Notes (Signed)
Established Patient Office Visit  Subjective   Patient ID: Luis Guzman, male    DOB: 20-Oct-1945  Age: 77 y.o. MRN: [id number]  Chief Complaint  Patient presents with   Annual Exam    HPI   Luis Guzman is seen for physical exam.  He has chronic problems including history of CAD, hypertension, past history of pancreatitis, osteoarthritis involving multiple joints, chronic dyspnea on exertion, mild hyperglycemia, hyperlipidemia.  He has had progressive dyspnea especially with exertion but some at rest as well along with some chest pains and has had extensive evaluations per cardiology.  He has follow-up with cardiology tomorrow.  His dyspnea is increasingly limiting activities.  Current medications reviewed.  He has ongoing issues with insomnia.  Sometimes has difficulty getting more than 3 to 4 hours of sleep even with trazodone 50 mg.  Avoids late the use of caffeine.  Health maintenance reviewed:  Health Maintenance  Topic Date Due   Diabetic kidney evaluation - Urine ACR  08/19/2017   FOOT EXAM  05/03/2019   HEMOGLOBIN A1C  11/19/2022   COVID-19 Vaccine (9 - 2023-24 season) 04/16/2023   OPHTHALMOLOGY EXAM  04/27/2023   Medicare Annual Wellness (AWV)  05/21/2023   Diabetic kidney evaluation - eGFR measurement  01/18/2024   DTaP/Tdap/Td (7 - Td or Tdap) 09/17/2025   Pneumonia Vaccine 94+ Years old  Completed   INFLUENZA VACCINE  Completed   Hepatitis C Screening  Completed   Zoster Vaccines- Shingrix  Completed   HPV VACCINES  Aged Out   Colonoscopy  Discontinued   -Flu vaccine already given -He is considering whether to get COVID booster.  Social history and family history reviewed with no significant changes  Past Medical History:  Diagnosis Date   Anemia    as infant history of   CAD S/P PTCA & DES PCI - for Progressive Angina 02/26/2019   Cath-PCI 02/26/2019: EF 55-65%. mCx 65% (FFR 0.82 - Med Rx). D2 85% - Wolverine Scoring Balloon PTCA (2.0 mm). Ost rPDA - 90%  -Resolute Onyx 2.5 x 15 (2.6 mm). 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 lower back pain, s/p cervical neck surgery"stiffness" remains   Transfusion history    infant "anemia"   Past Surgical History:  Procedure Laterality Date   APPENDECTOMY  ~ 1959   BACK SURGERY     CARDIAC CATHETERIZATION     CERVICAL DISC ARTHROPLASTY N/A 07/30/2018   Procedure: Cervical five-six Cervical six-seven Artificial disc replacement;  Surgeon: Barnett Abu, MD;  Location: [facility] OR;  Service: Neurosurgery;  Laterality: N/A;   CERVICAL LAMINECTOMY  1997   C 4 and C 5   CHOLECYSTECTOMY N/A 04/15/2014   Procedure: LAPAROSCOPIC CHOLECYSTECTOMY WITH INTRAOPERATIVE CHOLANGIOGRAM;  Surgeon: Darnell Level, MD;  Location: [facility] ORS;  Service: General;  Laterality: N/A;   COLONOSCOPY     CORONARY BALLOON ANGIOPLASTY N/A 02/26/2019   Procedure: CORONARY BALLOON ANGIOPLASTY;  Surgeon: Marykay Lex, MD;  Location: [facility] INVASIVE CV LAB;  Service: Cardiovascular ->  scoring balloon PTCA (Wolverine 2.0 mm) of ostial D2 85% reducing to 20-30%.   CORONARY PRESSURE/FFR STUDY N/A 02/26/2019   Procedure: INTRAVASCULAR PRESSURE WIRE/FFR STUDY;  Surgeon: Marykay Lex, MD;  Location: [facility] INVASIVE CV LAB;  Service: Cardiovascular;; mCx ~65% - DFR 0.92, FR 0.82 - BORDERLINE--> MED Rx   CORONARY PRESSURE/FFR STUDY N/A 11/10/2021   Procedure: INTRAVASCULAR PRESSURE WIRE/FFR STUDY;  Surgeon: Marykay Lex, MD;  Location: [facility] INVASIVE CV LAB;  Service: Cardiovascular;  Laterality: N/A;   CORONARY STENT INTERVENTION  N/A 02/26/2019   Procedure: CORONARY STENT INTERVENTION;  Surgeon: Marykay Lex, MD;  Location: [facility] INVASIVE CV LAB;; PCI ostial RPDA (initial attempt of PTCA only led to small local tear/dissection covered with stent) Resolute Onyx 2.5 mm x 15 mm (2.6 mm   CORONARY STENT INTERVENTION N/A 07/05/2019   Procedure: CORONARY STENT INTERVENTION;  Surgeon: Marykay Lex, MD;  Location: [facility] INVASIVE CV LAB;  Service: Cardiovascular;; ostial D1 95% (restenosis of PTCA site) -> DES PCI RESOLUTE ONYX 2.25 mm x 50 mm (2.6 mm) positioned to avoid the true ostium.    ESOPHAGOGASTRODUODENOSCOPY N/A 03/13/2014   Procedure: ESOPHAGOGASTRODUODENOSCOPY (EGD);  Surgeon: Graylin Shiver, MD;  Location: [facility] ENDOSCOPY;  Service: Endoscopy;  Laterality: N/A;   LEFT HEART CATH AND CORONARY ANGIOGRAPHY N/A 02/26/2019   Procedure: LEFT HEART CATH AND CORONARY ANGIOGRAPHY;  Surgeon: Marykay Lex, MD;  Location: [facility] INVASIVE CV LAB;  EF 55-65%. mCx 65% (FFR 0.82 - Med Rx). D2 85% - Wolverine Scoring Balloon PTCA (2.0 mm). Ost rPDA - 90% -Resolute Onyx 2.5 x 15 (2.6 mm).    LEFT HEART CATH AND CORONARY ANGIOGRAPHY N/A 07/05/2019   Procedure: LEFT HEART CATH AND CORONARY ANGIOGRAPHY;  Surgeon: Marykay Lex, MD;  Location: [facility] INVASIVE CV LAB;; ostial D1 95% (restenosis of PTCA site) -> DES PCI.  PDA stent widely patent.  Mid CX stable 65% lesion.  Proximal RCA 40%.  Mid RCA 45%.  Normal EDP.   LEFT HEART CATH AND CORONARY ANGIOGRAPHY N/A 11/10/2021   Procedure: LEFT HEART CATH AND CORONARY ANGIOGRAPHY;  Surgeon: Marykay Lex, MD;  Location: [facility] INVASIVE CV LAB;  Service: CV:: : Stable mid LCx with 5% (RFR 0.95).  Stable proximal RCA 40% and mid RCA 55%.  D1 and RPDA stents widely patent (   NM MYOVIEW LTD  10/20/2021   Horizontal ST depressions in V4 and V5, frequent exercise-induced PVCs.  Inferior patient defect improves with stress-consistent with artifact.  Positive EKG, negative perfusion imaging.  Read as moderate  risk study because of exertional ectopy.  Normal EF.   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   RIGHT HEART CATH N/A 01/26/2023   Procedure: RIGHT HEART CATH;  Surgeon: [name], MD;  Location: [facility] INVASIVE CV LAB;  Service: Cardiovascular;  Laterality: N/A;   SHOULDER ARTHROSCOPY Bilateral    SHOULDER ARTHROSCOPY W/ ROTATOR CUFF REPAIR Bilateral 2004-2009   left 2004, right 2009   TOTAL HIP ARTHROPLASTY Left 03/04/2020   Procedure: TOTAL HIP ARTHROPLASTY ANTERIOR APPROACH;  Surgeon: Ollen Gross, MD;  Location: [facility] ORS;  Service: Orthopedics;  Laterality: Left;    TOTAL HIP ARTHROPLASTY Right 09/30/2020   Procedure: TOTAL HIP ARTHROPLASTY ANTERIOR APPROACH;  Surgeon: Ollen Gross, MD;  Location: [facility] ORS;  Service: Orthopedics;  Laterality: Right;    TRANSTHORACIC ECHOCARDIOGRAM  02/2020; 10/01/2020   a) EF 55 to 60%.  GR 1 DD.  No R WMA.  Trivial  MR.  Otherwise normal.; b) EF 55 to 60%.  Normal LV function and no wall motion abnormality.  Normal RV.  Normal valves..   TRANSTHORACIC ECHOCARDIOGRAM  10/18/2021   Normal LV function with a EF of 65%.  No RWMA.  GR 1 DD.  Normal valves.  Mild concentric LVH.  Mild MR and mild-moderate AI.   VASECTOMY     WISDOM TOOTH EXTRACTION      reports that he quit smoking about 54 years ago. His smoking use included cigarettes. He started smoking about 59 years ago. He has a 5 pack-year smoking history. He has never used smokeless tobacco. He reports that he does not currently use alcohol. He reports that he does not use drugs. family history includes Arthritis in his sister; Hypertension in his father and mother; Stroke (age of onset: 29) in his sister. Allergies  Allergen Reactions   Statins     Muscle pain   Penicillins Itching    Has patient had a PCN reaction causing immediate rash, facial/tongue/throat swelling, SOB or lightheadedness with hypotension: no Has patient had a PCN reaction causing severe rash  involving mucus membranes or skin necrosis: unkn Has patient had a PCN reaction that required hospitalization: no Has patient had a PCN reaction occurring within the last 10 years: no If all of the above answers are "NO", then may proceed with Cephalosporin use.  Tolerated Cephalosporin Date: 10/01/20.    Review of Systems  Constitutional:  Negative for chills, fever and malaise/fatigue.  Eyes:  Negative for blurred vision.  Respiratory:  Positive for shortness of breath. Negative for cough and hemoptysis.   Cardiovascular:  Negative for chest pain.  Gastrointestinal:  Negative for abdominal pain.  Neurological:  Negative for dizziness, weakness and headaches.      Objective:     BP 108/60 (BP Location: Left Arm, Patient Position: Sitting, Cuff Size: Normal)   Pulse 63   Temp 97.9 F (36.6 C) (Oral)   Ht 6' (1.829 m)   Wt 193 lb 12.8 oz (87.9 kg)   SpO2 98%   BMI 26.28 kg/m  BP Readings from Last 3 Encounters:  06/13/23 108/60  06/07/23 (!) 122/52  04/12/23 114/78   Wt Readings from Last 3 Encounters:  06/13/23 193 lb 12.8 oz (87.9 kg)  04/12/23 192 lb 3.2 oz (87.2 kg)  03/14/23 192 lb 0.3 oz (87.1 kg)      Physical Exam Vitals reviewed.  Constitutional:      Appearance: He is well-developed.  HENT:     Right Ear: External ear normal.     Left Ear: External ear normal.  Eyes:     Pupils: Pupils are equal, round, and reactive to light.  Neck:     Thyroid: No thyromegaly.  Cardiovascular:     Rate and Rhythm: Normal rate and regular rhythm.  Pulmonary:     Effort: Pulmonary effort is normal. No respiratory distress.     Breath sounds: Normal breath sounds. No wheezing or rales.  Musculoskeletal:     Cervical back: Neck supple.     Right lower leg: No edema.     Left lower leg: No edema.  Neurological:     Mental Status: He is alert and oriented to person, place, and time.      No results found for any visits on 06/13/23.  Last CBC Lab Results   Component Value Date   WBC 8.3 06/13/2023   HGB 15.1 06/13/2023   HCT 45.1 06/13/2023  MCV 94.3 06/13/2023   MCH 30.8 01/18/2023   RDW 13.0 06/13/2023   PLT 246.0 06/13/2023   Last metabolic panel Lab Results  Component Value Date   GLUCOSE 115 (H) 06/13/2023   NA 143 06/13/2023   K 3.4 (L) 06/13/2023   CL 105 06/13/2023   CO2 28 06/13/2023   BUN 12 06/13/2023   CREATININE 1.04 06/13/2023   GFR 69.40 06/13/2023   CALCIUM 9.1 06/13/2023   PHOS 1.6 (L) 09/30/2020   PROT 6.8 06/13/2023   ALBUMIN 4.2 06/13/2023   LABGLOB 2.0 11/14/2022   AGRATIO 2.0 11/14/2022   BILITOT 0.5 06/13/2023   ALKPHOS 113 06/13/2023   AST 24 06/13/2023   ALT 21 06/13/2023   ANIONGAP 8 11/12/2021   Last lipids Lab Results  Component Value Date   CHOL 130 06/13/2023   HDL 40.60 06/13/2023   LDLCALC 52 06/13/2023   LDLDIRECT 129.0 08/19/2016   TRIG 188.0 (H) 06/13/2023   CHOLHDL 3 06/13/2023   Last hemoglobin A1c Lab Results  Component Value Date   HGBA1C 5.8 06/13/2023   Last thyroid functions Lab Results  Component Value Date   TSH 0.61 05/06/2019      The 10-year ASCVD risk score (Arnett DK, et al., 2019) is: 40.5%    Assessment & Plan:   Physical exam.  He has chronic problems as above with some progressive dyspnea with exertion being worked up per cardiology.  He has past history of prediabetes range blood sugars.  We discussed the following health maintenance items  -Check labs with CBC, CMP, lipid panel, A1c -Flu vaccine already given -Pneumonia vaccinations complete -Prior hepatitis C screen negative -Aged out of further colonoscopies -Shingrix complete -Tetanus due 2027  Continue close follow-up with cardiology regarding his ongoing chest pain and exertional dyspnea   No follow-ups on file.    Evelena Peat, MD

## 2023-06-14 ENCOUNTER — Other Ambulatory Visit: Payer: Self-pay

## 2023-06-14 ENCOUNTER — Inpatient Hospital Stay (HOSPITAL_COMMUNITY)
Admission: RE | Admit: 2023-06-14 | Discharge: 2023-06-14 | Discharge status: Home / Self Care | Payer: No Typology Code available for payment source | Source: Ambulatory Visit | Attending: Internal Medicine | Admitting: Internal Medicine

## 2023-06-14 ENCOUNTER — Ambulatory Visit (INDEPENDENT_AMBULATORY_CARE_PROVIDER_SITE_OTHER): Payer: No Typology Code available for payment source | Admitting: Cardiology

## 2023-06-14 ENCOUNTER — Encounter: Payer: Self-pay | Admitting: Cardiology

## 2023-06-14 ENCOUNTER — Ambulatory Visit (HOSPITAL_COMMUNITY): Payer: Medicare Other

## 2023-06-14 ENCOUNTER — Encounter (HOSPITAL_COMMUNITY): Payer: Self-pay | Admitting: Internal Medicine

## 2023-06-14 VITALS — BP 92/62 | HR 72 | Wt 192.6 lb

## 2023-06-14 VITALS — BP 118/62 | HR 68 | Ht 72.0 in | Wt 193.4 lb

## 2023-06-14 DIAGNOSIS — R0609 Other forms of dyspnea: Secondary | ICD-10-CM

## 2023-06-14 DIAGNOSIS — R079 Chest pain, unspecified: Secondary | ICD-10-CM | POA: Diagnosis not present

## 2023-06-14 DIAGNOSIS — K219 Gastro-esophageal reflux disease without esophagitis: Secondary | ICD-10-CM | POA: Diagnosis not present

## 2023-06-14 DIAGNOSIS — I2089 Other forms of angina pectoris: Secondary | ICD-10-CM

## 2023-06-14 DIAGNOSIS — Z01810 Encounter for preprocedural cardiovascular examination: Secondary | ICD-10-CM

## 2023-06-14 DIAGNOSIS — Z955 Presence of coronary angioplasty implant and graft: Secondary | ICD-10-CM | POA: Insufficient documentation

## 2023-06-14 DIAGNOSIS — E785 Hyperlipidemia, unspecified: Secondary | ICD-10-CM | POA: Diagnosis not present

## 2023-06-14 DIAGNOSIS — I119 Hypertensive heart disease without heart failure: Secondary | ICD-10-CM | POA: Insufficient documentation

## 2023-06-14 DIAGNOSIS — E119 Type 2 diabetes mellitus without complications: Secondary | ICD-10-CM | POA: Insufficient documentation

## 2023-06-14 DIAGNOSIS — Z87891 Personal history of nicotine dependence: Secondary | ICD-10-CM | POA: Diagnosis not present

## 2023-06-14 DIAGNOSIS — I25119 Atherosclerotic heart disease of native coronary artery with unspecified angina pectoris: Secondary | ICD-10-CM | POA: Diagnosis not present

## 2023-06-14 DIAGNOSIS — I10 Essential (primary) hypertension: Secondary | ICD-10-CM

## 2023-06-14 DIAGNOSIS — R0789 Other chest pain: Secondary | ICD-10-CM | POA: Diagnosis present

## 2023-06-14 DIAGNOSIS — Z9861 Coronary angioplasty status: Secondary | ICD-10-CM

## 2023-06-14 DIAGNOSIS — M791 Myalgia, unspecified site: Secondary | ICD-10-CM

## 2023-06-14 DIAGNOSIS — I5189 Other ill-defined heart diseases: Secondary | ICD-10-CM | POA: Diagnosis present

## 2023-06-14 DIAGNOSIS — I2510 Atherosclerotic heart disease of native coronary artery without angina pectoris: Secondary | ICD-10-CM

## 2023-06-14 DIAGNOSIS — T466X5A Adverse effect of antihyperlipidemic and antiarteriosclerotic drugs, initial encounter: Secondary | ICD-10-CM

## 2023-06-14 DIAGNOSIS — I7143 Infrarenal abdominal aortic aneurysm, without rupture: Secondary | ICD-10-CM

## 2023-06-14 MED ORDER — METOPROLOL SUCCINATE ER 25 MG PO TB24: 12.5000 mg | ORAL_TABLET | Freq: Every day | ORAL | 3 refills | Status: DC

## 2023-06-14 MED ORDER — PRALUENT 150 MG/ML ~~LOC~~ SOAJ: 150.0000 mg | SUBCUTANEOUS | 3 refills | Status: DC

## 2023-06-14 MED ORDER — TRAZODONE HCL 50 MG PO TABS: ORAL_TABLET | ORAL | 3 refills | Status: DC

## 2023-06-14 MED ORDER — AMLODIPINE BESYLATE 5 MG PO TABS: 5.0000 mg | ORAL_TABLET | Freq: Every day | ORAL | 3 refills | Status: DC

## 2023-06-14 MED ORDER — FUROSEMIDE 20 MG PO TABS: ORAL_TABLET | ORAL | 1 refills | Status: DC

## 2023-06-14 NOTE — Patient Instructions (Signed)
Good to see you today!       If you have any questions or concerns before your next appointment please send [place] a message through [employer] or call our office at [phone].    TO LEAVE A MESSAGE FOR THE NURSE SELECT OPTION 2, PLEASE LEAVE A MESSAGE INCLUDING: YOUR NAME DATE OF BIRTH CALL BACK NUMBER REASON FOR CALL**this is important as we prioritize the call backs  YOU WILL RECEIVE A CALL BACK THE SAME DAY AS LONG AS YOU CALL BEFORE 4:00 PM  At the [facility], you and your health needs are our priority. As part of our continuing mission to provide you with exceptional heart care, we have created designated Provider Care Teams. These Care Teams include your primary Cardiologist (physician) and Advanced Practice Providers (APPs- Physician Assistants and Nurse Practitioners) who all work together to provide you with the care you need, when you need it.   You may see any of the following providers on your designated Care Team at your next follow up: Dr Arvilla Meres Dr Marca Ancona Dr. Dorthula Nettles Dr. Clearnce Hasten Amy Filbert Schilder, NP Robbie Lis, [place] [facility] [place], [place] Brynda Peon, NP [place] Tracey, NP Karle Plumber, PharmD   Please be sure to bring in all your medications bottles to every appointment.    Thank you for choosing [facility]

## 2023-06-14 NOTE — Patient Instructions (Signed)
Medication Instructions:  No changes at present   Contact Advanced heart failure- if you have not heard anything about starting Sildenafil after you have your Cataract  surgeries  *If you need a refill on your cardiac medications before your next appointment, please call your pharmacy*   Lab Work: Not needed    Testing/Procedures: Not  needed   Follow-Up: At [facility], you and your health needs are our priority.  As part of our continuing mission to provide you with exceptional heart care, we have created designated Provider Care Teams.  These Care Teams include your primary Cardiologist (physician) and Advanced Practice Providers (APPs -  Physician Assistants and Nurse Practitioners) who all work together to provide you with the care you need, when you need it.     Your next appointment:   4 month(s)  The format for your next appointment:   In Person  Provider:   Bryan Lemma, MD

## 2023-06-14 NOTE — Progress Notes (Signed)
ADVANCED HF CLINIC NOTE   Primary Care: Kristian Covey, MD Primary Cardiologist: Bryan Lemma, MD HF Cardiologist: Dr. Gala Romney  HPI: Luis Guzman is a 77 y.o. male with CAD s/p PCI. DM2, HTN, HL, GERD referred by Dr. Herbie Baltimore for further evaluation of persistent exertional CP and dyspnea of unclear etiology.   Cath 3/23 LAD ok D1 stent patent LCx 55%m RCA 40%prox 55%m RPDA stent patent  Echo 2/24 EF 55-60% G1DD RV ok  CPX testing  12/14/22 showed:  FVC 4.12 (92%)      FEV1 3.34 (101%)        FEV1/FVC 81 (108%)        MVV 151 (115%)   Resting HR: 83 Standing HR: 85 Peak HR: 127   (88% age predicted max HR)  BP rest: 130/74 BP Standing: 108/74 BP peak: 178/64  Peak VO2: 20.9 (84% predicted peak VO2)  VE/VCO2 slope:  43  OUES: 1.68  Peak RER: 1.17  Ventilatory Threshold: 16.5 (66% predicted peak VO2)  Peak RR 36  Peak Ventilation:  91.7  VE/MVV:  61%  PETCO2 at peak:  29  O2pulse:  14   (93% predicted O2pulse)   Exercise RHC showed normal pulmonary pressures at rest. PA pressures increased with exercise with no significant change in PCWP  Returns for f/u with his wife. Continues to have CP and SOB with cardiac rehab. Says symptoms can last for a few days. Some relief with NTG   PET cardiac perfusion 06/07/23: EF 61% normal perfusion with stress  Cardiac Studies  Rest Ao = 136/75 RA = 2 RV = 31/5 PA = 32/16 (18) PCW = 11 Fick cardiac output/index = 4.9/2.4 Thermo CO/CI = 6.2/3.0  PVR = 1.2 WU FA sat = 99% PA sat = 71%, 72%   Peak exercise Ao = 164/83 PA = 61/15 (36) PCW = 17 Thermo CO/CI = 13.8/6.6 PVR = 1.4 WU  Past Medical History:  Diagnosis Date   Anemia    as infant history of   CAD S/P PTCA & DES PCI - for Progressive Angina 02/26/2019   Cath-PCI 02/26/2019: EF 55-65%. mCx 65% (FFR 0.82 - Med Rx). D2 85% - Wolverine Scoring Balloon PTCA (2.0 mm). Ost rPDA - 90% -Resolute Onyx 2.5 x 15 (2.6 mm). 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 lower back pain, s/p cervical neck surgery"stiffness" remains   Transfusion history    infant "anemia"   Current Outpatient Medications  Medication Sig Dispense Refill   acetaminophen (TYLENOL) 500 MG tablet Take 1,000 mg by mouth every 6 (six) hours as needed for moderate pain.     Alirocumab (PRALUENT) 150 MG/ML SOAJ Inject 150 mg into the skin every 14 (fourteen) days. 2 mL 11   amLODipine (NORVASC) 5 MG tablet Take 1 tablet (5 mg total) by mouth daily. 90 tablet 3   aspirin EC 81 MG tablet Take 1 tablet (81 mg total) by mouth daily. Swallow whole. ( start after stopping clopidogrel 75 mg) 90 tablet  3   bimatoprost (LUMIGAN) 0.03 % ophthalmic solution Place 1 drop into both eyes at bedtime.     Brinzolamide-Brimonidine (SIMBRINZA) 1-0.2 % SUSP Place 1 drop into both eyes in the morning and at bedtime.     furosemide (LASIX) 20 MG tablet TAKE 1 TO 2 TABLETS DAILY AS NEEDED FOR SHORTNESS OF BREATH OR WEIGHT GAIN 180 tablet 1   ibuprofen (ADVIL) 200 MG tablet Take 400 mg by mouth every 6 (six) hours as needed for moderate pain.     irbesartan (AVAPRO) 150 MG tablet Take 1 tablet (150 mg total) by mouth daily. 90 tablet 3   isosorbide mononitrate (IMDUR) 60 MG 24 hr tablet TAKE 1 FULL TABLET (60MG ) IN THE MORNING, AND HALF A TABLET (30MG ) IN THE EVENING (Patient taking differently: 60 mg. TAKE 1 FULL TABLET (90 MG) IN THE MORNING, AND HALF A TABLET (30MG ) IN THE EVENING per Jari Favre [facility] 05/01/23) 45 tablet 3   metoprolol succinate (TOPROL-XL) 25 MG 24 hr tablet Take 0.5 tablets  (12.5 mg total) by mouth daily. 45 tablet 3   nitroGLYCERIN (NITROSTAT) 0.4 MG SL tablet Place 1 tablet (0.4 mg total) under the tongue every 5 (five) minutes as needed for chest pain. 25 tablet 6   potassium chloride SA (KLOR-CON M) 10 MEQ tablet Take 10 mEq ( 1 capsule)  on days that you take lasix. 90 tablet 3   ranolazine (RANEXA) 500 MG 12 hr tablet Take 500 mg by mouth 2 (two) times daily.     traZODone (DESYREL) 50 MG tablet Take one to two tablets by mouth at night as needed for insomnia. 180 tablet 3   No current facility-administered medications for this encounter.   Allergies  Allergen Reactions   Statins     Muscle pain   Penicillins Itching    Has patient had a PCN reaction causing immediate rash, facial/tongue/throat swelling, SOB or lightheadedness with hypotension: no Has patient had a PCN reaction causing severe rash involving mucus membranes or skin necrosis: unkn Has patient had a PCN reaction that required hospitalization: no Has patient had a PCN reaction occurring within the last 10 years: no If all of the above answers are "NO", then may proceed with Cephalosporin use.  Tolerated Cephalosporin Date: 10/01/20.   Social History   Socioeconomic History   Marital status: Married    Spouse name: Not on file   Number of children: Not on file   Years of education: 16   Highest education level: Bachelor's degree (e.g., BA, AB, BS)  Occupational History   Not on file  Tobacco Use   Smoking status: Former    Current packs/day: 0.00    Average packs/day: 1 pack/day for 5.0 years (5.0 ttl pk-yrs)    Types: Cigarettes    Start date: 10/18/1963    Quit date: 10/17/1968    Years since quitting: 54.6   Smokeless tobacco: Never  Vaping Use   Vaping status: Never Used  Substance and Sexual Activity   Alcohol use: Not Currently    Comment: "quit drinking ~ 10/1968   Drug use: No   Sexual activity: Not on file  Other Topics Concern   Not on file  Social History  Narrative   Not on file   Social Determinants of Health   Financial Resource Strain: Low Risk  (06/12/2023)   Overall Financial Resource Strain (CARDIA)    Difficulty of Paying Living Expenses: Not hard at all  Food Insecurity: No Food Insecurity (06/12/2023)  Hunger Vital Sign    Worried About Running Out of Food in the Last Year: Never true    Ran Out of Food in the Last Year: Never true  Transportation Needs: No Transportation Needs (06/12/2023)   PRAPARE - Administrator, Civil Service (Medical): No    Lack of Transportation (Non-Medical): No  Physical Activity: Sufficiently Active (06/12/2023)   Exercise Vital Sign    Days of Exercise per Week: 5 days    Minutes of Exercise per Session: 30 min  Stress: No Stress Concern Present (06/12/2023)   [employer] of Occupational Health - Occupational Stress Questionnaire    Feeling of Stress : Not at all  Social Connections: Socially Integrated (06/12/2023)   Social Connection and Isolation Panel [NHANES]    Frequency of Communication with Friends and Family: More than three times a week    Frequency of Social Gatherings with Friends and Family: Twice a week    Attends Religious Services: More than 4 times per year    Active Member of [employer] or Organizations: Yes    Attends Engineer, structural: More than 4 times per year    Marital Status: Married  Catering manager Violence: Not At Risk (05/07/2021)   Humiliation, Afraid, Rape, and Kick questionnaire    Fear of Current or Ex-Partner: No    Emotionally Abused: No    Physically Abused: No    Sexually Abused: No   Family History  Problem Relation Age of Onset   Hypertension Mother    Hypertension Father    Arthritis Sister        rheumatoid   Stroke Sister 60   BP 92/62   Pulse 72   Wt 87.4 kg (192 lb 9.6 oz)   SpO2 98%   BMI 26.12 kg/m   Wt Readings from Last 3 Encounters:  06/14/23 87.4 kg (192 lb 9.6 oz)  06/13/23 87.9 kg (193 lb 12.8 oz)   04/12/23 87.2 kg (192 lb 3.2 oz)   PHYSICAL EXAM: General:  Well appearing. No resp difficulty HEENT: normal Neck: supple. no JVD. Carotids 2+ bilat; no bruits. No lymphadenopathy or thryomegaly appreciated. Cor: PMI nondisplaced. Regular rate & rhythm. No rubs, gallops or murmurs. Lungs: clear Abdomen: soft, nontender, nondistended. No hepatosplenomegaly. No bruits or masses. Good bowel sounds. Extremities: no cyanosis, clubbing, rash, edema Neuro: alert & orientedx3, cranial nerves grossly intact. moves all 4 extremities w/o difficulty. Affect pleasant   ASSESSMENT & PLAN:  1. Exertional CP and dyspnea - CPX testing shows relatively well preserved functional capacity but high VE/VCO2 slope suggestive of elevated pulmonary pressures with exercise +/- end-exercise hyperventilation - exercise RHC shows normal hemodynamics at rest but significant elevation in PA pressures with exercise with a normal PCWP - PET cardiac perfusion 06/07/23: EF 61% normal perfusion with stress - Continue with symptoms despite Imdur to 60 am/30 pm - Based on cath, normal CPX and PET perfusion (had symptoms during test) we do not have any objective evidence for myocardial ischemia as direct cause of his symptoms. - Currently only objective abnormality we have is rise in pulmonary pressures with exercise which can be a normal variant in elderly patients but can stil cause symptoms - We discussed this at length. I reinforced that I am not sure what is causing his symptoms but based on recent testing, I feel that exercise is safe for him despite symptoms - We discussed 3 options 1) Continue current therapy 2) Stop NTG and switch to PDE5i (  sildenafil) to see if lowering pulmonary pressures helps with symptoms 3) Consider referral to tertiary center for further evaluation of microvascular dysfunction - we also discussed possibility of starting an SSRI given his frustration with level of symptoms - He and his wife  will discuss and get back to [place]   2. CAD - Cath as above - Seems to have a component of chronic stable angina which may be related to microvascular disease vs. elevated pulmonary pressures as above - Plan as above  I spent a total of > 45 minutes today: 1) reviewing the patient's medical records including previous charts, labs and recent notes from other providers; 2) examining the patient and counseling them on their medical issues/explaining the plan of care; 3) adjusting meds as needed and 4) ordering lab work or other needed tests.    Arvilla Meres, MD  9:35 AM

## 2023-06-14 NOTE — Telephone Encounter (Signed)
Pt called, requesting to speak to CMA. CMA was unavailable. Pt asked that CMA call back at his earliest convenience.

## 2023-06-15 ENCOUNTER — Encounter (HOSPITAL_COMMUNITY): Payer: Self-pay | Admitting: *Deleted

## 2023-06-15 ENCOUNTER — Telehealth (HOSPITAL_COMMUNITY): Payer: Self-pay | Admitting: *Deleted

## 2023-06-15 ENCOUNTER — Telehealth (HOSPITAL_COMMUNITY): Payer: Self-pay | Admitting: Cardiology

## 2023-06-15 DIAGNOSIS — I2089 Other forms of angina pectoris: Secondary | ICD-10-CM

## 2023-06-15 NOTE — Telephone Encounter (Signed)
Spoke with Luis Guzman he does not plan to return to cardiac rehab at this time as he has two upcoming cataract surgeries pending. Destan said he may return at a later date and time to complete his sessions. Patient will have to attend another orientation. Will discharge from cardiac rehab at this time.Thayer Headings RN BSN

## 2023-06-15 NOTE — Telephone Encounter (Signed)
Triage vm -was told to return call regarding f/u treatment  Pt has now made decision would like to discuss    Returned call [facility]

## 2023-06-16 ENCOUNTER — Other Ambulatory Visit (HOSPITAL_COMMUNITY): Payer: Self-pay

## 2023-06-16 ENCOUNTER — Ambulatory Visit (HOSPITAL_COMMUNITY): Payer: Medicare Other

## 2023-06-16 MED ORDER — SERTRALINE HCL 50 MG PO TABS: 50.0000 mg | ORAL_TABLET | Freq: Every day | ORAL | 0 refills | Status: DC

## 2023-06-16 MED ORDER — SILDENAFIL CITRATE 20 MG PO TABS: 20.0000 mg | ORAL_TABLET | Freq: Three times a day (TID) | ORAL | 3 refills | Status: DC

## 2023-06-16 NOTE — Telephone Encounter (Signed)
Left message to call back  

## 2023-06-16 NOTE — Addendum Note (Signed)
Addended by: Christy Sartorius on: 06/16/2023 09:16 AM   Modules accepted: Orders

## 2023-06-16 NOTE — Telephone Encounter (Signed)
Spoke with patient here in lobby at HF clinic  Patient would like to start the sildenafil that was listed in Dr. Prescott Gum last note. As well as starting SSRI as recommended.   Spoke with Dr. Gala Romney- verbal order given to start patient on Sildenafil 20mg  three times daily. Patient needs to be completely off of isosorbide and NTG prior to starting. Patient educated and aware and verbalized understanding.   Dr. Gala Romney also ordered patient to start zoloft 50mg - take (1/2) tablet 25mg  total for 3-4 days and if tolerating increase to 50mg  1 full tablet (upon chart review- patient's PCP Dr. Caryl Never already sent in zoloft for patient.   Patient aware and verbalized understanding of all instructions. Medications sent to patient preferred pharmacy.

## 2023-06-16 NOTE — Telephone Encounter (Signed)
Please see result note 

## 2023-06-16 NOTE — Addendum Note (Signed)
Encounter addended by: [place], Yandriel Boening G, RD on: 06/16/2023 2:16 PM  Actions taken: Flowsheet accepted

## 2023-06-18 ENCOUNTER — Encounter: Payer: Self-pay | Admitting: Cardiology

## 2023-06-18 NOTE — Assessment & Plan Note (Signed)
Persistent symptoms despite Imdur and Ranexa. Discussed potential microvascular ischemia and the role of sildenafil in dilating pulmonary arteries. Options given by Dr. Gala Romney were: a) "gut it out"; b) referral to [facility] specialist @ a [facility], and c) trial of converting from Imdur to Sildenafil (for Rx of Pulmonary HTN)  -Consider switching from Imdur to sildenafil after cataract surgery. -Consult with Dr. Gala Romney for appropriate sildenafil dosing.

## 2023-06-18 NOTE — Assessment & Plan Note (Signed)
Multiple different stress tests including most recently stress PET reviewed.  No evidence of ongoing ischemia despite having chest pain dyspnea exertion.  Plan: Continue low-dose Toprol 12.5 mg daily along with amlodipine 5 mg daily along with Ranexa 500 mg twice daily for antianginal benefit Continue irbesartan under 50 mg daily for afterload reduction Continue aspirin monotherapy Statin intolerant-doing well on Praluent. Will continue Imdur for now with plans for him to convert from Imdur to sildenafil dosing per Dr. Prescott Gum recommendations (however would probably recommend waiting until after his cataract surgery to avoid any concerns of side effects of surgery being confused with side effects to medications.)

## 2023-06-18 NOTE — Assessment & Plan Note (Addendum)
Normal aortic diameter by recent Dopplers.

## 2023-06-18 NOTE — Assessment & Plan Note (Addendum)
Please see-section on exertional dyspnea

## 2023-06-18 NOTE — Assessment & Plan Note (Signed)
Well-controlled blood pressure on current dose of irbesartan 150 mg daily and amlodipine 5 mg daily along with low-dose Toprol 12.5 mg daily.

## 2023-06-18 NOTE — Assessment & Plan Note (Signed)
On aspirin monotherapy for history of two-vessel PCI.  Noted significant bruising with Plavix.  This Luis Guzman out from PCI to be stable on aspirin.  Okay to hold aspirin 5 to 7 days preop for surgeries or procedures.

## 2023-06-18 NOTE — Assessment & Plan Note (Signed)
Intolerant of multiple statins as well as Repatha. Doing well on Praluent

## 2023-06-18 NOTE — Assessment & Plan Note (Signed)
Based on recent ischemic evaluation, no need for further evaluation for upcoming cataract surgery.  Okay to hold aspirin 5 to 7 days preop.

## 2023-06-18 NOTE — Assessment & Plan Note (Signed)
With persistent exertional dyspnea and chest pain with minimal relief with nitroglycerin, which he was evaluated for CPX and stress PET to exclude MINOCA.  Results would suggest that coronary blood flow is normal, which leaves the elevated pulmonary pressures during exertion is the only etiology explain his symptoms.  At this point I am not sure how much benefit he is getting from Imdur and I do think that the concept of switching from Imdur to sildenafil is probably the best option.  He is on max tolerated dose of Ranexa and the increase in dose of Imdur from 60-90 did not help.  We will also continue blood pressure control/afterload reduction with the use of amlodipine and Toprol at low-dose along with irbesartan 150 mg daily. He has not required any doses of Lasix.[name]

## 2023-06-18 NOTE — Assessment & Plan Note (Signed)
Remains on Praluent-tolerating well. Most recent lipids in April 2024 showed TC 127, TG 169, HDL 44 and LDL of 55.

## 2023-06-19 ENCOUNTER — Ambulatory Visit (HOSPITAL_COMMUNITY): Payer: Medicare Other

## 2023-06-22 ENCOUNTER — Telehealth (HOSPITAL_COMMUNITY): Payer: Self-pay | Admitting: Pharmacy Technician

## 2023-06-22 ENCOUNTER — Other Ambulatory Visit (HOSPITAL_COMMUNITY): Payer: Self-pay

## 2023-06-22 MED ORDER — SILDENAFIL CITRATE 20 MG PO TABS: 20.0000 mg | ORAL_TABLET | Freq: Three times a day (TID) | ORAL | 11 refills | Status: DC

## 2023-06-22 NOTE — Telephone Encounter (Signed)
Advanced Heart Failure Patient Advocate Encounter  Spoke with patient regarding Sildenafil. Explained to patient that he does not qualify to get the medication through insurance. He requested that we send the request to the [place]. Sent 90 day RX request to Chantel (CMA) to send to [facility]. He will not start the medication for a few weeks, until another provider clears him after a scheduled surgery. I advised him that the medication was ~$15.70 at our pharmacy. He can reach out to me if he would like a script sent to our pharmacy for mailing.  Archer Asa, CPhT

## 2023-06-22 NOTE — Addendum Note (Signed)
Addended by: Theresia Bough on: 06/22/2023 03:48 PM   Modules accepted: Orders

## 2023-06-30 ENCOUNTER — Other Ambulatory Visit (HOSPITAL_COMMUNITY): Payer: Self-pay

## 2023-06-30 MED ORDER — SILDENAFIL CITRATE 20 MG PO TABS
20.0000 mg | ORAL_TABLET | Freq: Three times a day (TID) | ORAL | 3 refills | Status: DC
  Filled 2023-06-30: qty 270, 90d supply, fill #0

## 2023-06-30 NOTE — Telephone Encounter (Signed)
Advanced Heart Failure Patient Advocate Encounter  Patient called back and requested Sildenafil 90 day supply to be sent to [facility]. Transferred him to provide CC information. Will mail rx. Sent 90 day RX request to Chantel (CMA) to send to [facility].  Archer Asa, CPhT

## 2023-06-30 NOTE — Addendum Note (Signed)
Addended by: Otila Starn, Milagros Reap on: 06/30/2023 03:40 PM   Modules accepted: Orders

## 2023-07-01 ENCOUNTER — Other Ambulatory Visit (HOSPITAL_BASED_OUTPATIENT_CLINIC_OR_DEPARTMENT_OTHER): Payer: Self-pay

## 2023-07-04 ENCOUNTER — Other Ambulatory Visit (HOSPITAL_COMMUNITY): Payer: Self-pay

## 2023-07-08 ENCOUNTER — Other Ambulatory Visit: Payer: Self-pay | Admitting: Family Medicine

## 2023-07-10 ENCOUNTER — Other Ambulatory Visit (HOSPITAL_BASED_OUTPATIENT_CLINIC_OR_DEPARTMENT_OTHER): Payer: Self-pay

## 2023-07-10 MED ORDER — COVID-19 MRNA VAC-TRIS(PFIZER) 30 MCG/0.3ML IM SUSY
0.3000 mL | PREFILLED_SYRINGE | Freq: Once | INTRAMUSCULAR | 0 refills | Status: AC
  Filled 2023-07-10: qty 0.3, 1d supply, fill #0

## 2023-07-17 ENCOUNTER — Telehealth: Payer: Self-pay

## 2023-07-17 NOTE — Patient Outreach (Signed)
  Care Management   Outreach Note  07/17/2023 Name: Luis Guzman MRN: [id number] DOB: [birth year]  An unsuccessful telephone outreach was attempted today to contact the patient about Care Management needs.     Follow Up Plan:  A HIPAA compliant phone message was left for the patient providing contact information and requesting a return call.    [facility]  [facility], [facility] RN Care Manager Direct Dial: [phone] Website: [email or web address]

## 2023-08-11 ENCOUNTER — Encounter: Payer: Self-pay | Admitting: Cardiology

## 2023-08-11 NOTE — Telephone Encounter (Signed)
Called patient with no answer. Left message for patient to call back.

## 2023-08-11 NOTE — Telephone Encounter (Signed)
Called patient left message on personal voice mail to call back to discuss. 

## 2023-08-11 NOTE — Telephone Encounter (Signed)
Patient did return call. Verified name and DOB. Patient is calling to report he has been having SOB and bilateral lower leg and feet swelling. He stated he stopped his Imdur and nitroglycerin on 11/20 and started Sildenafil and Zoloft. He said he had SOB and chest pain/pressure about 3 days into the transition that went away and he felt fine. He started having SOB again about 2 weeks ago and is getting worse. On Monday 12/23 he woke up with swelling to both feet and legs and a 4 lb weight gain. He also started taking Lasix 60 mg daily on 12/23 and hasn't notice a decrease in his weight. After talking to Dr Antoine Poche patient was advised to take 80 mg of Lasix on 12/28, 12/29 and increase his Klor-Con from 10 to 20 mg  daily 12/28 and 12/29. Patient verbalized understanding and agree. Will also have him call on Monday 12/30 to see if he can get in to see Dr Kittie Plater.

## 2023-08-11 NOTE — Telephone Encounter (Signed)
Pt returning call Pt asked that you call cell

## 2023-08-14 NOTE — Telephone Encounter (Signed)
Patient is calling back to report to RN Dionne Milo that he has lost 3 lbs since Friday up until this morning. He states his SOB is improving, but not completley gone and the swelling has come down some, but his feet are still puffy.   Please advise.

## 2023-08-15 ENCOUNTER — Telehealth (HOSPITAL_COMMUNITY): Payer: Self-pay | Admitting: Cardiology

## 2023-08-15 DIAGNOSIS — I5189 Other ill-defined heart diseases: Secondary | ICD-10-CM

## 2023-08-15 DIAGNOSIS — I10 Essential (primary) hypertension: Secondary | ICD-10-CM

## 2023-08-15 MED ORDER — FUROSEMIDE 20 MG PO TABS: 60.0000 mg | ORAL_TABLET | Freq: Every day | ORAL | 3 refills | Status: DC

## 2023-08-15 NOTE — Addendum Note (Signed)
 Addended by: Wilberto Console, Milagros Reap on: 08/15/2023 11:39 AM   Modules accepted: Orders

## 2023-08-15 NOTE — Telephone Encounter (Signed)
Addressed in separate encounter Please see telephone encounter with HF team dated 12/31 Thanks!

## 2023-08-15 NOTE — Telephone Encounter (Signed)
Please call. Instruct to take lasix 60 mg daily.   Please set up for BMET .   Haywood Meinders NP-C  10:19 AM

## 2023-08-15 NOTE — Telephone Encounter (Signed)
 Patient was advised to contact HF team for continued weight gain and SOB.   Reports he was advised to increase lasix  by Dr Sibyl office 12/26 Weight was up 5-6 lbs in a weeks time, severe SOB and BLE  Since lasix  increase weight is down  (currently 2-3 lbs above base line) SOB has improved however still has some SOB with exertion Report BLE is stable/minimal-at the end of the day will notice indention in legs   -pt would like to know if he should continue increased dose of lasix   Currently taking lasix  80 mg daily   Please advise

## 2023-08-15 NOTE — Telephone Encounter (Signed)
 Spoke to pt who states that he had lost 3 lbs after following Dr. Denver advice for inc. Lasix  (80 mg daily) and inc. K (20 mg daily).  He also states that this morning he gained 1.5 lbs.  Advised pt to call Dr. Nelle office (established pt) to be seen for further advice. Will forward to Dr. Anner and Reena Lunger, RN.

## 2023-08-15 NOTE — Telephone Encounter (Signed)
Pt is calling back to check status of call. Please call her back on mobile number!

## 2023-08-15 NOTE — Telephone Encounter (Signed)
Pt aware Repeat labs 1/3

## 2023-08-18 ENCOUNTER — Ambulatory Visit (HOSPITAL_COMMUNITY)
Admission: RE | Admit: 2023-08-18 | Discharge: 2023-08-18 | Disposition: A | Discharge status: Home / Self Care | Payer: No Typology Code available for payment source | Source: Ambulatory Visit | Attending: Cardiology | Admitting: Cardiology

## 2023-08-18 ENCOUNTER — Telehealth (HOSPITAL_COMMUNITY): Payer: Self-pay | Admitting: Cardiology

## 2023-08-18 DIAGNOSIS — I5189 Other ill-defined heart diseases: Secondary | ICD-10-CM | POA: Insufficient documentation

## 2023-08-18 LAB — BASIC METABOLIC PANEL
Anion gap: 10 (ref 5–15)
BUN: 14 mg/dL (ref 8–23)
CO2: 26 mmol/L (ref 22–32)
Calcium: 9.2 mg/dL (ref 8.9–10.3)
Chloride: 104 mmol/L (ref 98–111)
Creatinine, Ser: 1 mg/dL (ref 0.61–1.24)
GFR, Estimated: >60 mL/min (ref >60)
Glucose, Bld: 118 mg/dL — ABNORMAL HIGH (ref 70–99)
Potassium: 2.9 mmol/L — ABNORMAL LOW (ref 3.5–5.1)
Sodium: 140 mmol/L (ref 135–145)

## 2023-08-18 MED ORDER — POTASSIUM CHLORIDE CRYS ER 10 MEQ PO TBCR: EXTENDED_RELEASE_TABLET | ORAL | 3 refills | Status: DC

## 2023-08-18 NOTE — Telephone Encounter (Signed)
Pt aware.

## 2023-08-18 NOTE — Telephone Encounter (Signed)
-----   Message from Tonye Becket sent at 08/18/2023  1:03 PM EST ----- Please call. Potassium low. Please call instruct to take 60 meq potassium now then start 40 meq daily. Repeat BMET next week.

## 2023-08-25 ENCOUNTER — Ambulatory Visit (HOSPITAL_COMMUNITY)
Admission: RE | Admit: 2023-08-25 | Discharge: 2023-08-25 | Disposition: A | Discharge status: Home / Self Care | Payer: No Typology Code available for payment source | Source: Ambulatory Visit | Attending: Internal Medicine | Admitting: Internal Medicine

## 2023-08-25 DIAGNOSIS — I5189 Other ill-defined heart diseases: Secondary | ICD-10-CM | POA: Diagnosis present

## 2023-08-25 LAB — BASIC METABOLIC PANEL
Anion gap: 9 (ref 5–15)
BUN: 11 mg/dL (ref 8–23)
CO2: 24 mmol/L (ref 22–32)
Calcium: 9 mg/dL (ref 8.9–10.3)
Chloride: 107 mmol/L (ref 98–111)
Creatinine, Ser: 1.03 mg/dL (ref 0.61–1.24)
GFR, Estimated: >60 mL/min (ref >60)
Glucose, Bld: 119 mg/dL — ABNORMAL HIGH (ref 70–99)
Potassium: 3.8 mmol/L (ref 3.5–5.1)
Sodium: 140 mmol/L (ref 135–145)

## 2023-09-15 ENCOUNTER — Other Ambulatory Visit: Payer: Self-pay

## 2023-09-15 ENCOUNTER — Telehealth: Payer: Self-pay

## 2023-09-15 NOTE — Patient Outreach (Signed)
  Care Management   Outreach Note  09/15/2023 Name: Luis Guzman MRN: [id number] DOB: May 17, 1946  An unsuccessful telephone outreach was attempted today to contact the patient about Care Management needs.     Follow Up Plan:  A HIPAA compliant phone message was left for the patient providing contact information and requesting a return call.     [name] [facility] Population Health RN Care Manager Direct Dial: [phone]  Fax: [phone] Website: [email or web address]

## 2023-09-20 ENCOUNTER — Encounter (HOSPITAL_COMMUNITY): Payer: Self-pay | Admitting: Internal Medicine

## 2023-09-20 ENCOUNTER — Ambulatory Visit (HOSPITAL_COMMUNITY)
Admission: RE | Admit: 2023-09-20 | Discharge: 2023-09-20 | Disposition: A | Discharge status: Home / Self Care | Payer: Medicare Other | Source: Ambulatory Visit | Attending: Internal Medicine | Admitting: Internal Medicine

## 2023-09-20 VITALS — BP 104/60 | HR 66 | Wt 188.0 lb

## 2023-09-20 DIAGNOSIS — I10 Essential (primary) hypertension: Secondary | ICD-10-CM | POA: Insufficient documentation

## 2023-09-20 DIAGNOSIS — I2089 Other forms of angina pectoris: Secondary | ICD-10-CM

## 2023-09-20 DIAGNOSIS — Z79899 Other long term (current) drug therapy: Secondary | ICD-10-CM | POA: Insufficient documentation

## 2023-09-20 DIAGNOSIS — E119 Type 2 diabetes mellitus without complications: Secondary | ICD-10-CM | POA: Insufficient documentation

## 2023-09-20 DIAGNOSIS — K219 Gastro-esophageal reflux disease without esophagitis: Secondary | ICD-10-CM | POA: Insufficient documentation

## 2023-09-20 DIAGNOSIS — I25119 Atherosclerotic heart disease of native coronary artery with unspecified angina pectoris: Secondary | ICD-10-CM | POA: Diagnosis not present

## 2023-09-20 DIAGNOSIS — I272 Pulmonary hypertension, unspecified: Secondary | ICD-10-CM

## 2023-09-20 DIAGNOSIS — R079 Chest pain, unspecified: Secondary | ICD-10-CM

## 2023-09-20 DIAGNOSIS — R0609 Other forms of dyspnea: Secondary | ICD-10-CM | POA: Insufficient documentation

## 2023-09-20 DIAGNOSIS — R0789 Other chest pain: Secondary | ICD-10-CM | POA: Diagnosis present

## 2023-09-20 DIAGNOSIS — E785 Hyperlipidemia, unspecified: Secondary | ICD-10-CM | POA: Insufficient documentation

## 2023-09-20 DIAGNOSIS — Z955 Presence of coronary angioplasty implant and graft: Secondary | ICD-10-CM | POA: Insufficient documentation

## 2023-09-20 DIAGNOSIS — G8929 Other chronic pain: Secondary | ICD-10-CM

## 2023-09-20 LAB — BASIC METABOLIC PANEL
Anion gap: 9 (ref 5–15)
BUN: 14 mg/dL (ref 8–23)
CO2: 25 mmol/L (ref 22–32)
Calcium: 9.7 mg/dL (ref 8.9–10.3)
Chloride: 103 mmol/L (ref 98–111)
Creatinine, Ser: 1.11 mg/dL (ref 0.61–1.24)
GFR, Estimated: >60 mL/min (ref >60)
Glucose, Bld: 114 mg/dL — ABNORMAL HIGH (ref 70–99)
Potassium: 4.7 mmol/L (ref 3.5–5.1)
Sodium: 137 mmol/L (ref 135–145)

## 2023-09-20 LAB — BRAIN NATRIURETIC PEPTIDE: B Natriuretic Peptide: 24.6 pg/mL (ref 0.0–100.0)

## 2023-09-20 MED ORDER — SILDENAFIL CITRATE 20 MG PO TABS: 40.0000 mg | ORAL_TABLET | Freq: Three times a day (TID) | ORAL | 2 refills | Status: DC

## 2023-09-20 NOTE — Patient Instructions (Signed)
 Great to see you today!!!  Increase Sildenafil  to 40 mg (2 tabs) Three times a day   Labs done today, your results will be available in MyChart, we will contact you for abnormal readings.  Please contact the [facility] for a second opinion  Your physician recommends that you schedule a follow-up appointment in: 6 months (August), **PLEASE CALL OUR OFFICE IN JUNE TO SCHEDULE THIS APPOINTMENT  If you have any questions or concerns before your next appointment please send us  a message through [place] or call our office at [phone].    TO LEAVE A MESSAGE FOR THE NURSE SELECT OPTION 2, PLEASE LEAVE A MESSAGE INCLUDING: YOUR NAME DATE OF BIRTH CALL BACK NUMBER REASON FOR CALL**this is important as we prioritize the call backs  YOU WILL RECEIVE A CALL BACK THE SAME DAY AS LONG AS YOU CALL BEFORE 4:00 PM  At the [facility], you and your health needs are our priority. As part of our continuing mission to provide you with exceptional heart care, we have created designated Provider Care Teams. These Care Teams include your primary Cardiologist (physician) and Advanced Practice Providers (APPs- Physician Assistants and Nurse Practitioners) who all work together to provide you with the care you need, when you need it.   You may see any of the following providers on your designated Care Team at your next follow up: Dr Toribio Fuel Dr Ezra Shuck Dr. Ria Commander Dr. Morene Brownie Amy Lenetta, NP Caffie Shed, [place] [facility] [place], [place] Beckey Coe, NP Jordan Polka, NP Tinnie Redman, PharmD   Please be sure to bring in all your medications bottles to every appointment.    Thank you for choosing [facility]

## 2023-09-20 NOTE — Progress Notes (Signed)
 ADVANCED HF CLINIC NOTE   Primary Care: Micheal Wolm ORN, MD Primary Cardiologist: Alm Clay, MD HF Cardiologist: Dr. Cherrie  HPI: Luis Guzman is a 78 y.o. male with CAD s/p PCI. DM2, HTN, HL, GERD referred by Dr. Clay for further evaluation of persistent exertional CP and dyspnea of unclear etiology.   Cath 3/23 LAD ok D1 stent patent LCx 55%m RCA 40%prox 55%m RPDA stent patent  Echo 2/24 EF 55-60% G1DD RV ok  CPX testing  12/14/22 showed:  FVC 4.12 (92%)      FEV1 3.34 (101%)        FEV1/FVC 81 (108%)        MVV 151 (115%)   Resting HR: 83 Standing HR: 85 Peak HR: 127   (88% age predicted max HR)  BP rest: 130/74 BP Standing: 108/74 BP peak: 178/64  Peak VO2: 20.9 (84% predicted peak VO2)  VE/VCO2 slope:  43  OUES: 1.68  Peak RER: 1.17  Ventilatory Threshold: 16.5 (66% predicted peak VO2)  Peak RR 36  Peak Ventilation:  91.7  VE/MVV:  61%  PETCO2 at peak:  29  O2pulse:  14   (93% predicted O2pulse)   Exercise RHC showed normal pulmonary pressures at rest. PA pressures increased with exercise with no significant change in PCWP PET cardiac perfusion 06/07/23: EF 61% normal perfusion with stress  Based of exercise RHC we stop Imdur  and started sildenafil  for exercise-induced PAH. Also started sertraline .  Here with his wife. Says sildenafil  helped for a while but over last few days he has been having more chest pressure. Said he had a bad episode yesterday which persisted into this morning. Now can get SOB with and have CP with mild activity.    Cardiac Studies  Rest Ao = 136/75 RA = 2 RV = 31/5 PA = 32/16 (18) PCW = 11 Fick cardiac output/index = 4.9/2.4 Thermo CO/CI = 6.2/3.0  PVR = 1.2 WU FA sat = 99% PA sat = 71%, 72%   Peak exercise Ao = 164/83 PA = 61/15 (36) PCW = 17 Thermo CO/CI = 13.8/6.6 PVR = 1.4 WU  Past Medical History:  Diagnosis Date   Anemia    as infant history of   CAD S/P PTCA & DES PCI - for Progressive Angina 02/26/2019    Cath-PCI 02/26/2019: EF 55-65%. mCx 65% (FFR 0.82 - Med Rx). D2 85% - Wolverine Scoring Balloon PTCA (2.0 mm). Ost rPDA - 90% -Resolute Onyx 2.5 x 15 (2.6 mm). 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 lower back pain, s/p cervical neck surgerystiffness remains   Transfusion history    infant anemia   Current Outpatient Medications  Medication Sig Dispense Refill   acetaminophen  (TYLENOL ) 500 MG tablet Take 1,000 mg by mouth every 6 (six) hours as needed for moderate pain.     Alirocumab  (PRALUENT ) 150 MG/ML SOAJ Inject 1 mL (150 mg total) into the skin every 14 (fourteen) days. 6 mL 3   amLODipine  (NORVASC ) 5 MG tablet Take  1 tablet (5 mg total) by mouth daily. 90 tablet 3   aspirin  EC 81 MG tablet Take 1 tablet (81 mg total) by mouth daily. Swallow whole. ( start after stopping clopidogrel  75 mg) 90 tablet 3   bimatoprost (LUMIGAN) 0.03 % ophthalmic solution Place 1 drop into both eyes at bedtime.     Brinzolamide-Brimonidine (SIMBRINZA) 1-0.2 % SUSP Place 1 drop into both eyes in the morning and at bedtime.     furosemide  (LASIX ) 20 MG tablet Take 3 tablets (60 mg total) by mouth daily. 90 tablet 3   ibuprofen (ADVIL) 200 MG tablet Take 400 mg by mouth every 6 (six) hours as needed for moderate pain.     irbesartan  (AVAPRO ) 150 MG tablet Take 1 tablet (150 mg total) by mouth daily. 90 tablet 3   metoprolol  succinate (TOPROL -XL) 25 MG 24 hr tablet Take 0.5 tablets (12.5 mg total) by mouth daily. 45 tablet 3   potassium chloride  (KLOR-CON  M) 10 MEQ tablet Take 40 mEq by  mouth daily.     ranolazine  (RANEXA ) 500 MG 12 hr tablet Take 500 mg by mouth 2 (two) times daily.     sertraline  (ZOLOFT ) 50 MG tablet Take 25 mg by mouth daily.     sildenafil  (REVATIO ) 20 MG tablet Take 1 tablet (20 mg total) by mouth 3 (three) times daily. 270 tablet 3   traZODone  (DESYREL ) 50 MG tablet Take one to two tablets by mouth at night as needed for insomnia. 180 tablet 3   No current facility-administered medications for this encounter.   Allergies  Allergen Reactions   Statins     Muscle pain   Penicillins Itching    Has patient had a PCN reaction causing immediate rash, facial/tongue/throat swelling, SOB or lightheadedness with hypotension: no Has patient had a PCN reaction causing severe rash involving mucus membranes or skin necrosis: unkn Has patient had a PCN reaction that required hospitalization: no Has patient had a PCN reaction occurring within the last 10 years: no If all of the above answers are NO, then may proceed with Cephalosporin use.  Tolerated Cephalosporin Date: 10/01/20.   Social History   Socioeconomic History   Marital status: Married    Spouse name: Not on file   Number of children: Not on file   Years of education: 16   Highest education level: Bachelor's degree (e.g., BA, AB, BS)  Occupational History   Not on file  Tobacco Use   Smoking status: Former    Current packs/day: 0.00    Average packs/day: 1 pack/day for 5.0 years (5.0 ttl pk-yrs)    Types: Cigarettes    Start date: 10/18/1963    Quit date: 10/17/1968    Years since quitting: 54.9   Smokeless tobacco: Never  Vaping Use   Vaping status: Never Used  Substance and Sexual Activity   Alcohol use: Not Currently    Comment: quit drinking ~ 10/1968   Drug use: No   Sexual activity: Not on file  Other Topics Concern   Not on file  Social History Narrative   Not on file   Social Drivers of Health   Financial Resource Strain: Low Risk  (06/12/2023)   Overall Financial  Resource Strain (CARDIA)    Difficulty of Paying Living Expenses: Not hard at all  Food Insecurity: No Food Insecurity (06/12/2023)   Hunger Vital Sign    Worried About Running Out of Food in the Last Year: Never true    Ran Out  of Food in the Last Year: Never true  Transportation Needs: No Transportation Needs (06/12/2023)   PRAPARE - Administrator, Civil Service (Medical): No    Lack of Transportation (Non-Medical): No  Physical Activity: Sufficiently Active (06/12/2023)   Exercise Vital Sign    Days of Exercise per Week: 5 days    Minutes of Exercise per Session: 30 min  Stress: No Stress Concern Present (06/12/2023)   [employer] of Occupational Health - Occupational Stress Questionnaire    Feeling of Stress : Not at all  Social Connections: Socially Integrated (06/12/2023)   Social Connection and Isolation Panel [NHANES]    Frequency of Communication with Friends and Family: More than three times a week    Frequency of Social Gatherings with Friends and Family: Twice a week    Attends Religious Services: More than 4 times per year    Active Member of [employer] or Organizations: Yes    Attends Engineer, Structural: More than 4 times per year    Marital Status: Married  Catering Manager Violence: Not At Risk (05/07/2021)   Humiliation, Afraid, Rape, and Kick questionnaire    Fear of Current or Ex-Partner: No    Emotionally Abused: No    Physically Abused: No    Sexually Abused: No   Family History  Problem Relation Age of Onset   Hypertension Mother    Hypertension Father    Arthritis Sister        rheumatoid   Stroke Sister 60   BP 104/60   Pulse 66   Wt 85.3 kg (188 lb)   SpO2 98%   BMI 25.50 kg/m   Wt Readings from Last 3 Encounters:  09/20/23 85.3 kg (188 lb)  06/14/23 87.4 kg (192 lb 9.6 oz)  06/14/23 87.7 kg (193 lb 6.4 oz)   PHYSICAL EXAM: General:  Well appearing. No resp difficulty HEENT: normal Neck: supple. no JVD. Carotids 2+  bilat; no bruits. No lymphadenopathy or thryomegaly appreciated. Cor: PMI nondisplaced. Regular rate & rhythm. No rubs, gallops or murmurs. Lungs: clear Abdomen: soft, nontender, nondistended. No hepatosplenomegaly. No bruits or masses. Good bowel sounds. Extremities: no cyanosis, clubbing, rash, edema Neuro: alert & orientedx3, cranial nerves grossly intact. moves all 4 extremities w/o difficulty. Affect pleasant    ASSESSMENT & PLAN:  1. Exertional CP and dyspnea - CPX testing shows relatively well preserved functional capacity but high VE/VCO2 slope suggestive of elevated pulmonary pressures with exercise +/- end-exercise hyperventilation - exercise RHC shows normal hemodynamics at rest but significant elevation in PA pressures with exercise with a normal PCWP - PET cardiac perfusion 06/07/23: EF 61% normal perfusion with stress - Continue with symptoms despite Imdur  to 60 am/30 pm - Based on cath, normal CPX and PET perfusion (had symptoms during test) we do not have any objective evidence for myocardial ischemia as direct cause of his symptoms. - Currently only objective abnormality we have is rise in pulmonary pressures with exercise which can be a normal variant in elderly patients but can stil cause symptoms - He has mild benefit from a trial of sildenafil  but still with prominent resting symptoms - Based on clinical findings it is hard for me to explain what is causing his symptoms and I conveyed to him and his wife that given the impact of his symptoms on his QoL that he should go to an academic center for a second opinion and I have suggested the [facility]. I have also discussed  this with Dr. Lesli who agrees., - He has asked if we should increase the sildenafil . As he feels as if he has had some improvement initially with exertional symptoms will increase sildenafil  to 40 tid. Can drop back down as needed  2. CAD - Cath as above - Seems to have a component of chronic  stable angina which may be related to microvascular disease vs. elevated pulmonary pressures but does not explain his resting symptoms. - consider [facility] second opinion as above  I spent a total of 45 minutes today: 1) reviewing the patient's medical records including previous charts, labs and recent notes from other providers; 2) examining the patient and counseling them on their medical issues/explaining the plan of care; 3) adjusting meds as needed and 4) ordering lab work or other needed tests.    Toribio Fuel, MD  9:22 AM

## 2023-09-22 ENCOUNTER — Ambulatory Visit: Payer: No Typology Code available for payment source | Attending: Cardiology | Admitting: Cardiology

## 2023-09-22 ENCOUNTER — Encounter: Payer: Self-pay | Admitting: Cardiology

## 2023-09-22 ENCOUNTER — Telehealth: Payer: Self-pay | Admitting: Cardiology

## 2023-09-22 VITALS — BP 110/60 | HR 76 | Ht 73.0 in | Wt 188.6 lb

## 2023-09-22 DIAGNOSIS — R5383 Other fatigue: Secondary | ICD-10-CM

## 2023-09-22 DIAGNOSIS — I10 Essential (primary) hypertension: Secondary | ICD-10-CM | POA: Diagnosis not present

## 2023-09-22 DIAGNOSIS — M791 Myalgia, unspecified site: Secondary | ICD-10-CM

## 2023-09-22 DIAGNOSIS — I2089 Other forms of angina pectoris: Secondary | ICD-10-CM

## 2023-09-22 DIAGNOSIS — I25119 Atherosclerotic heart disease of native coronary artery with unspecified angina pectoris: Secondary | ICD-10-CM

## 2023-09-22 DIAGNOSIS — R0609 Other forms of dyspnea: Secondary | ICD-10-CM

## 2023-09-22 DIAGNOSIS — E785 Hyperlipidemia, unspecified: Secondary | ICD-10-CM

## 2023-09-22 DIAGNOSIS — R600 Localized edema: Secondary | ICD-10-CM

## 2023-09-22 DIAGNOSIS — T466X5A Adverse effect of antihyperlipidemic and antiarteriosclerotic drugs, initial encounter: Secondary | ICD-10-CM

## 2023-09-22 DIAGNOSIS — Z9861 Coronary angioplasty status: Secondary | ICD-10-CM

## 2023-09-22 DIAGNOSIS — I2510 Atherosclerotic heart disease of native coronary artery without angina pectoris: Secondary | ICD-10-CM

## 2023-09-22 MED ORDER — RANOLAZINE ER 500 MG PO TB12: ORAL_TABLET | ORAL | 6 refills | Status: DC

## 2023-09-22 NOTE — Patient Instructions (Addendum)
 Medication Instructions:  - Dr. Anner would like to wean you off metoprolol  succinate (TOPROL -XL) 25 MG 24 hr tablet. Start 1 tablet (25mg ) every other day until current bottle is complete. Then STOP.   - Increase ranolazine  (RANEXA ) 500 MG 12 hr tablet to 1500mg  daily. Take 2 tablets (1000mg  total) in AM and 1 tablet (500mg  ) in evening. 1500mg  total daily   *If you need a refill on your cardiac medications before your next appointment, please call your pharmacy*   Lab Work: None    If you have labs (blood work) drawn today and your tests are completely normal, you will receive your results only by: MyChart Message (if you have MyChart) OR A paper copy in the mail If you have any lab test that is abnormal or we need to change your treatment, we will call you to review the results.   Testing/Procedures: None    Follow-Up: At [facility], you and your health needs are our priority.  As part of our continuing mission to provide you with exceptional heart care, we have created designated Provider Care Teams.  These Care Teams include your primary Cardiologist (physician) and Advanced Practice Providers (APPs -  Physician Assistants and Nurse Practitioners) who all work together to provide you with the care you need, when you need it.  We recommend signing up for the patient portal called MyChart.  Sign up information is provided on this After Visit Summary.  MyChart is used to connect with patients for Virtual Visits (Telemedicine).  Patients are able to view lab/test results, encounter notes, upcoming appointments, etc.  Non-urgent messages can be sent to your provider as well.   To learn more about what you can do with MyChart, go to [email or web address].    Your next appointment:   6 month(s)  The format for your next appointment:   In Person  Provider:   Alm Anner, MD    Other Instructions Please send our office information for [facility] contact. This information is  needed for your [facility] REFERRAL.

## 2023-09-22 NOTE — Telephone Encounter (Signed)
 Patient was seen in office today and later came back to ask for a message to get to Dr Anner.  He said he needs to get an extension to his [facility] Referral which expired on 08/15/23.   VA referral: [id number]  He stated an email needs to be sent to : [email or web address]  The email needs to contain:  1: A request for additional services  2. the office note from today's visit 09/22/23. 3. Subject line:ADVANCED HEART FAILURE

## 2023-09-22 NOTE — Progress Notes (Signed)
 Cardiology Office Note:  .   Date:  09/23/2023  ID:  Luis Guzman Jun 16, 1946, MRN [id number] PCP: Micheal Wolm LELON, MD  [facility] Providers Cardiologist:  Alm Clay, MD Adv CHF: Cherrie Sieving, MD  No chief complaint on file.  Patient Profile: .     Luis Guzman is a 78 y.o. male  with a PMH notable for CAD (two-vessel PCI) with Ongoing Chest Pain Both at Rest and Exertion, Exertional Pulmonary Pretension, HTN, HLD who presents here for 78-month follow-up at the request of Micheal Wolm LELON, MD.    Luis Guzman was last seen on 06/14/2023. I agreed with Dr. Bensimhon - switched to Sildenafil  from Imdur   He was actually just seen by Dr. Cherrie on February 5 noting that there was some improvement with sildenafil  but over the last several days there is more episodes of chest pressure and a significant episode on 4 February that persisted overnight.  Now noting that getting dyspneic and chest pain with mild activity.  Dr. Cherrie actually called me after this visit we discussed the fact that it was difficult to explain what was causing his symptoms.  He recommended based on the quality of life issues that they search for second opinion at [facility] and a referral placed..   Subjective  Discussed the use of AI scribe software for clinical note transcription with the patient, who gave verbal consent to proceed.  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  He experiences shortness of breath and chest pressure, particularly after exertion. These symptoms began to worsen around the week of Christmas, coinciding with a five-pound weight gain. Despite increasing his Lasix  dosage to three tablets and potassium to four tablets daily, he continues to experience shortness of breath during activities such as showering and shopping at [facility], where he also felt  chest pressure. The shortness of breath does not resolve with rest, although he is able to sleep through the night without issues. No shortness of breath while lying flat or waking up short of breath. An EKG performed during a visit was normal.  Episodes are quite prolonged and not consistent with certain levels of exertion -- may even happen at rest.   He has exertional pulmonary hypertension, identified during a right heart catheterization that showed increased pulmonary pressures upon exertion. He was previously removed from cardiac rehab until further clearance and has not been able to engage in regular walking due to cold weather.  His current medications include sildenafil  20 mg, which was recently increased to 40 mg (2 Tab) TID, and he has not noticed any changes yet. He is also on Ranexa  (500 mg BID), Lasix  (60 mg - with KlorCon 40mEq), and Praluent  (150 mg inj q 2wk) among others. He takes Lasix  60 mg daily and has not needed extra doses. He is on a regimen of 500 mg of Ranexa  twice a day. He is also taking aspirin  and no longer on Plavix . He has a history of being on Imdur , which was discontinued without noticeable changes. He remains on 12.5 mg daily Toprol  & Irbesartan  150 mg daily along with ASA 81 mg (no longer on Plavix ).   Dr. Bensimhon has also started Zoloft  50 mg with consideration that anxiety is playing a role in his symptoms.    Dr. Cherrie recommended seeking outside opinion from a [facility] Exercise Physiology expert.  Cardiovascular ROS: positive for - chest pain, edema, palpitations, and rapid heart rate negative for - orthopnea, paroxysmal nocturnal dyspnea, rapid heart rate, or syncope or near syncope, TIA or amaurosis fugax, claudication.  Melena, hematochezia, hematuria epistaxis.    Objective   Studies Reviewed: SABRA        Summation of Previous Studies: Cath March 2023: Patent D1 and PDA stents.  55% LCx and 40% RCA Echo February 2024: EF 55 to 60%.  GR 1  DD. CPX 12/14/2022: FVC 4.12 (92%); FEV1 3.34 (101%); FEV1/FVC 81 (108%); MVV 151 (115%)    Resting HR: 83 Standing HR: 85 Peak HR: 127   (88% age predicted max HR)  BP rest: 130/74 BP Standing: 108/74 BP peak: 178/64  Peak VO2: 20.9 (84% predicted peak VO2)  VE/VCO2 slope:  43 ; OUES: 1.68  Peak RER: 1.17  Ventilatory Threshold: 16.5 (66% predicted peak VO2)  Peak RR 36; Peak Ventilation:  91.7  VE/MVV:  61%  PETCO2 at peak:  29  O2pulse:  14   (93% predicted O2pulse)   Exercise RHC (01/26/2023): Normal pressures at rest.  PA pressures increased exercise but no significant change PCWP.=> Based on these results, Imdur  stopped and was started on sildenafil  for exercise-induced PAH.  Also started sertraline . Cardiac PET (06/07/2023): Normal EF 61% normal perfusion.;  Rest EF 63%, but stress EF 61%.  Global myocardial blood flow reserve 2.17-normal.  LOW RISK.  Lab Results  Component Value Date   CHOL 130 06/13/2023   HDL 40.60 06/13/2023   LDLCALC 52 06/13/2023   LDLDIRECT 129.0 08/19/2016   TRIG 188.0 (H) 06/13/2023   CHOLHDL 3 06/13/2023   Risk Assessment/Calculations:           Physical Exam:   VS:  BP 110/60 (BP Location: Left Arm, Patient Position: Sitting)   Pulse 76   Ht 6' 1 (1.854 m)   Wt 188 lb 9.6 oz (85.5 kg)   SpO2 98%   BMI 24.88 kg/m    Wt Readings from Last 3 Encounters:  09/22/23 188 lb 9.6 oz (85.5 kg)  09/20/23 188 lb (85.3 kg)  06/14/23 192 lb 9.6 oz (87.4 kg)    GEN: Well nourished, well developed in no acute distress; well-groomed.  Healthy-appearing. NECK: No JVD; No carotid bruits CARDIAC: Normal S1, S2; RRR, no murmurs, rubs, gallops RESPIRATORY:  Clear to auscultation without rales, wheezing or rhonchi ; nonlabored, good air movement. ABDOMEN: Soft, non-tender, non-distended EXTREMITIES:  No edema; No deformity     ASSESSMENT AND PLAN: .    Problem List Items Addressed This Visit       Cardiology Problems   Atypical angina (HCC) - Primary  (Chronic)   Continues to have chest pain despite patent stents on cath, normal CPX and nonischemic stress PET.  Dr. Bensimhon suggest that this potentially could be related to exercise related pulmonary hypertension, but it is not significant of extent that he would be having resting symptoms.  Plan: Increase morning dose of Ranexa  to 1000 mg and continue 500 mg p.m. Continue 5 mg Norvasc -perhaps this could be increased once we see his blood pressure response to dropping Toprol  (with consideration of microvascular disease although the stress PET did not suggest) Agree with increase sildenafil  to 40 mg TID Mostly because of his dyspnea, fatigue type issues, endocrine stop his low-dose beta-blocker as I do not think it is doing much for him.  Will continue irbesartan  150 mg daily. Also agree with continuing Zoloft .  We will  get information about the name of the doctor from [facility] that documents [place] would like referral to.   [place] is asked that I write a referral letter through the [place] to see if this could referral Will be approved.      Relevant Medications   ranolazine  (RANEXA ) 500 MG 12 hr tablet   CAD S/P & DES PCI -ostial PDA and D1 (Chronic)   No longer on Plavix  because of significant bruising.  He is far enough out now from his PCI to be on aspirin  monotherapy. Okay to hold aspirin  5-7 days preop for surgeries or procedures.      Relevant Medications   ranolazine  (RANEXA ) 500 MG 12 hr tablet   Coronary artery disease involving native coronary artery of native heart with angina pectoris (HCC) (Chronic)   Patent stents on cath and Stress Tests have all been normal including CPX and cardiac PET -> difficult to call this microvascular.  -Continue aspirin  monotherapy as noted -With statin intolerance, continue Praluent  injections -Wean off Toprol  and continue irbesartan  -Increase a.m. dose of Ranexa  to 1000 mg and continue 5 and milligram p.m. dose -Continue low-dose amlodipine  5  mg for additional angina control, and irbesartan  150 mg fracture reduction      Relevant Medications   ranolazine  (RANEXA ) 500 MG 12 hr tablet   Essential (primary) hypertension (Chronic)   BP is stable.  He is on Norvasc  5 mg daily along with irbesartan  150 mg daily and Toprol  12.5 mg daily.  We are stopping Toprol  and continue other medications.      Relevant Medications   ranolazine  (RANEXA ) 500 MG 12 hr tablet   Hyperlipidemia with target LDL less than 70 - statin intolerant (Chronic)   Intolerant of statin, myopathies and memory issues. Now on Praluent  with an LDL of 52.  Tolerating well.      Relevant Medications   ranolazine  (RANEXA ) 500 MG 12 hr tablet     Other   Dyspnea on exertion with chest tightness (Chronic)   Shortness of breath with activity, including showering and walking around stores. EKG normal. Possible exertional pulmonary hypertension or microvascular cardiac disease. -Increase daytime Ranexa  to 1000mg . -Discontinue Metoprolol  over two weeks (take every other day then stop). -Planning referral to specialist at [facility] for further evaluation.      Fatigue due to treatment (Chronic)   Wean off Toprol  completely.  Continue with ARB and Norvasc .      Leg edema   Weight gain of 5 pounds in one week around Christmas, managed with increased Lasix  and potassium. Interestingly, not associate with PND or orthopnea.  Would suggest may be a noncardiac etiology.  Certainly Norvasc  could be a component could but that would not explain sudden increase of swelling. -Continue Lasix  60mg  daily and potassium supplementation.      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  Intolerant of follow-up with myalgias but also memory issues.  Finally these were stopped and Repatha  was initiated.  He then had adverse effects of Repatha  but has been tolerating Praluent .  -Continue Praluent   150 mg injections every 2 weeks      General Health Maintenance -Continue Aspirin  for cardiovascular protection. -Continue Avapro  for hypertension. -Continue Revatio  (sildenafil ) 40mg  three times daily. -Follow-up:  Return in about 3 months (around 12/20/2023) for Routine follow up with me.  I spent 62 minutes in the care of NICOLAAS SAVO today including reviewing labs (  2 min), reviewing studies (9 min reviewing the litany of studies -- reviewed from last LCP-Cors to recent CPX ), face to face time discussing treatment options (32 min -- long discussion of Sx, visit with Dr. Cherrie & potential referral to [facility] & potential need for letter to [facility] for approval), reviewing records from last few visits with me & with Dr. Cherrie -7 min.  Also 5 min phone call with Dr. Bensimhon (12 min total), 7 min dictating , and documenting in the encounter.    Signed, Alm MICAEL Clay, MD, MS Alm Clay, M.D., M.S. Interventional Cardiologist  [facility]  Pager # [phone] Phone # [phone] [address] [place], [place] [address]

## 2023-09-23 DIAGNOSIS — R600 Localized edema: Secondary | ICD-10-CM | POA: Insufficient documentation

## 2023-09-23 NOTE — Assessment & Plan Note (Addendum)
 Continues to have chest pain despite patent stents on cath, normal CPX and nonischemic stress PET.  Dr. Bensimhon suggest that this potentially could be related to exercise related pulmonary hypertension, but it is not significant of extent that he would be having resting symptoms.  Plan: Increase morning dose of Ranexa  to 1000 mg and continue 500 mg p.m. Continue 5 mg Norvasc -perhaps this could be increased once we see his blood pressure response to dropping Toprol  (with consideration of microvascular disease although the stress PET did not suggest) Agree with increase sildenafil  to 40 mg TID Mostly because of his dyspnea, fatigue type issues, endocrine stop his low-dose beta-blocker as I do not think it is doing much for him.  Will continue irbesartan  150 mg daily. Also agree with continuing Zoloft .  We will get information about the name of the doctor from [facility] that documents [place] would like referral to.   Ladarrius is asked that I write a referral letter through the [place] to see if this could referral Will be approved.

## 2023-09-23 NOTE — Assessment & Plan Note (Signed)
 Multiple statins tried including atorvastatin, simvastatin  rosuvastatin  as well as pravastatin.  Intolerant of follow-up with myalgias but also memory issues.  Finally these were stopped and Repatha  was initiated.  He then had adverse effects of Repatha  but has been tolerating Praluent .  -Continue Praluent  150 mg injections every 2 weeks

## 2023-09-23 NOTE — Assessment & Plan Note (Signed)
 Intolerant of statin, myopathies and memory issues. Now on Praluent  with an LDL of 52.  Tolerating well.

## 2023-09-23 NOTE — Assessment & Plan Note (Signed)
 Shortness of breath with activity, including showering and walking around stores. EKG normal. Possible exertional pulmonary hypertension or microvascular cardiac disease. -Increase daytime Ranexa  to 1000mg . -Discontinue Metoprolol  over two weeks (take every other day then stop). -Planning referral to specialist at [facility] for further evaluation.

## 2023-09-23 NOTE — Assessment & Plan Note (Signed)
 Wean off Toprol  completely.  Continue with ARB and Norvasc .

## 2023-09-23 NOTE — Assessment & Plan Note (Signed)
 BP is stable.  He is on Norvasc  5 mg daily along with irbesartan  150 mg daily and Toprol  12.5 mg daily.  We are stopping Toprol  and continue other medications.

## 2023-09-23 NOTE — Assessment & Plan Note (Signed)
 Patent stents on cath and Stress Tests have all been normal including CPX and cardiac PET -> difficult to call this microvascular.  -Continue aspirin  monotherapy as noted -With statin intolerance, continue Praluent  injections -Wean off Toprol  and continue irbesartan  -Increase a.m. dose of Ranexa  to 1000 mg and continue 5 and milligram p.m. dose -Continue low-dose amlodipine  5 mg for additional angina control, and irbesartan  150 mg fracture reduction

## 2023-09-23 NOTE — Assessment & Plan Note (Signed)
 No longer on Plavix  because of significant bruising.  He is far enough out now from his PCI to be on aspirin  monotherapy. Okay to hold aspirin  5-7 days preop for surgeries or procedures.

## 2023-09-23 NOTE — Assessment & Plan Note (Signed)
 Weight gain of 5 pounds in one week around Christmas, managed with increased Lasix  and potassium. Interestingly, not associate with PND or orthopnea.  Would suggest may be a noncardiac etiology.  Certainly Norvasc  could be a component could but that would not explain sudden increase of swelling. -Continue Lasix  60mg  daily and potassium supplementation.

## 2023-09-25 ENCOUNTER — Telehealth (HOSPITAL_COMMUNITY): Payer: Self-pay | Admitting: Cardiology

## 2023-09-25 ENCOUNTER — Telehealth: Payer: Self-pay | Admitting: Cardiology

## 2023-09-25 NOTE — Telephone Encounter (Signed)
 Pt called in about AVS. He states it tells him to f/u in 6 months but closer to the bottom it says 3 months. He would to clarify when he should return back.

## 2023-09-25 NOTE — Telephone Encounter (Signed)
 Patient called to request provider details that were discussed at OV on 2/7  Aware he was advised to reach out to [facility] clinic however does not remember provider   Please advise

## 2023-09-26 NOTE — Telephone Encounter (Signed)
Forward this to [employer] /auth. Dept

## 2023-09-27 ENCOUNTER — Other Ambulatory Visit: Payer: Self-pay

## 2023-09-27 NOTE — Patient Outreach (Signed)
  Care Management   Visit Note  Name: Luis Guzman MRN: [id number] DOB: 1945-11-24  Subjective: Luis Guzman is a 78 y.o. year old male who is a primary care patient of Burchette, Elberta Fortis, MD. The Care Management team was consulted for assistance.       Call returned from Mr. Lank. Discussed care management needs and availability to complete telephonic outreach. Anticipates being available on 09/27/23. Agreed to call or contact the clinic if he requires urgent assistance prior to scheduled outreach.   PLAN Appointment scheduled for 09/27/23    [name] [facility] Population Health RN Care Manager Direct Dial: [phone]  Fax: [phone] Website: [email or web address]

## 2023-09-27 NOTE — Patient Outreach (Signed)
Care Management   Visit Note  09/27/2023 Name: Luis Guzman MRN: [id number] DOB: March 17, 1946  Subjective: Luis Guzman is a 78 y.o. year old male who is a primary care patient of Burchette, Elberta Fortis, MD. The Care Management team was consulted for assistance.      Engaged with Luis Guzman via telephone.  Assessment:  Review of patient past medical history, allergies, medications, health status, including review of consultants reports, laboratory and other test data, was performed as part of  evaluation and provision of care management services.   Outpatient Encounter Medications as of 09/27/2023  Medication Sig Note   acetaminophen (TYLENOL) 500 MG tablet Take 1,000 mg by mouth every 6 (six) hours as needed for moderate pain.    Alirocumab (PRALUENT) 150 MG/ML SOAJ Inject 1 mL (150 mg total) into the skin every 14 (fourteen) days.    amLODipine (NORVASC) 5 MG tablet Take 1 tablet (5 mg total) by mouth daily.    aspirin EC 81 MG tablet Take 1 tablet (81 mg total) by mouth daily. Swallow whole. ( start after stopping clopidogrel 75 mg)    bimatoprost (LUMIGAN) 0.03 % ophthalmic solution Place 1 drop into both eyes at bedtime.    Brinzolamide-Brimonidine (SIMBRINZA) 1-0.2 % SUSP Place 1 drop into both eyes in the morning and at bedtime.    furosemide (LASIX) 20 MG tablet Take 3 tablets (60 mg total) by mouth daily.    irbesartan (AVAPRO) 150 MG tablet Take 1 tablet (150 mg total) by mouth daily.    metoprolol succinate (TOPROL-XL) 25 MG 24 hr tablet Take 0.5 tablets (12.5 mg total) by mouth daily. 09/27/2023: Reports plan is to wean off. Currently taking every other day and will discontinue when current medication runs out.   potassium chloride (KLOR-CON M) 10 MEQ tablet Take 40 mEq by mouth daily.    ranolazine (RANEXA) 500 MG 12 hr tablet Take 2 tablets (1000mg  total) in AM and 1 tablet (500mg  ) in evening. 1500mg  total daily    sertraline (ZOLOFT) 50 MG tablet Take 25 mg by mouth daily.    traZODone  (DESYREL) 50 MG tablet Take one to two tablets by mouth at night as needed for insomnia. 09/27/2023: Currently taking two as needed   ibuprofen (ADVIL) 200 MG tablet Take 400 mg by mouth every 6 (six) hours as needed for moderate pain.    sildenafil (REVATIO) 20 MG tablet Take 2 tablets (40 mg total) by mouth 3 (three) times daily.    No facility-administered encounter medications on file as of 09/27/2023.    Interventions:   Goals      Monitor, Self-Manage And Reduce Symptoms of Hyperlipidemia     Current Barriers:  Care Management support and education needs related to Hyperlipidemia  Planned Interventions: Reviewed current treatment plan related to Hyperlipidemia, self-management, and patient's adherence to plan as established by provider.  Reviewed and discussed established cholesterol goals. Reviewed medications and indications for use. Discussed importance of taking medications exactly as prescribed. Hyperlipidemia is currently being managed with Praluent injections. Reports receiving injections every 14 days as prescribed. Discussed importance of regular laboratory monitoring as prescribed. Reviewed importance of limiting foods high in cholesterol. Advised to continue readings nutrition labels, limiting foods high in cholesterol and avoiding highly processed foods when possible. Reviewed activity goals. Encouraged to continue engaging in low impact exercises as tolerated.  Reviewed worsening s/sx that require immediate medical attention.    Lab Results  Component Value Date   CHOL 130  06/13/2023   HDL 40.60 06/13/2023   LDLCALC 52 06/13/2023   LDLDIRECT 129.0 08/19/2016   TRIG 188.0 (H) 06/13/2023   CHOLHDL 3 06/13/2023     Symptom Management: Take medications as prescribed   Complete labs as ordered Attend all scheduled provider appointments Call doctor with any symptoms you believe are related to your medicine Adhere to recommended cardiac prudent/heart healthy  diet Call provider office for new concerns or questions         Monitor, Self-Manage And Reduce Symptoms of Hypertension     Current Barriers:  Care Management support and education needs related to Hypertension  Planned Interventions: Reviewed current treatment plan related to Hypertension, self-management, and patient's adherence to plan as established by provider.  Reviewed medications and indications for use. Discussed importance of taking medications exactly as prescribed. He was recently evaluated by Dr. Dorethea Clan. Reviewed medication changes. Reports increasing Ranexa and sildenafil as instructed. Reviewed instructions for weaning off Toprol. Reports following instructions to take every other day for the next two weeks and discontinue. Denies concerns related to medication management or prescription cost. Provided information regarding established blood pressure parameters along with indications for notifying a provider. Advised to monitor and record reading. Reviewed symptoms. He reports episodes of chest pressure which he describes as chronic. Also notes exertional dyspnea with mild activity. Reports various triggers. Notes at times episodes occur early in the morning and resolved quickly. Other times, episodes occur after activity and may take several hours to resolve. Denies lightheadedness, dizziness, nausea, headaches or sudden visual changes. Reports lower extremity edema which he describes as his baseline since starting sildenafil. Reports monitoring daily as advised. Reports weight has remained within established range. Weight today was 183 lbs. He reports thorough discussion of symptoms with the Cardiology team and symptoms were deemed to be chronic. Notes urgent interventions/procedures are not required. In addition to medication changes, he will be referred to the [facility] for evaluation within the next three months.  Reviewed safety and fall prevention measures.  Reports currently  completing ADLs and IADLs independently. Discussed compliance with recommended cardiac prudent diet. Encouraged to continue readings nutrition labels, monitor sodium intake, and avoid highly processed foods when possible.  Reviewed pending appointments.  He will follow up with Cardiology/Dr. Dorethea Clan on 12/19/23. Thorough review of s/sx of heart attack, stroke and worsening symptoms that require immediate medical attention.   BP Readings from Last 3 Encounters:  09/22/23 110/60  09/20/23 104/60  06/14/23 92/62    Wt Readings from Last 3 Encounters:  09/22/23 188 lb 9.6 oz (85.5 kg)  09/20/23 188 lb (85.3 kg)  06/14/23 192 lb 9.6 oz (87.4 kg)      Symptom Management Take all medications as prescribed and continue plan to wean off Toprol Attend all scheduled provider appointments Call pharmacy for medication refills 3-7 days in advance of running out of medications Call provider office for new concerns or questions  Check blood pressure and keep a log Continue to weigh daily and record weights Continue engaging in exercises and mild activity as tolerated Read nutrition labels. Eat whole grains, fruits and vegetables, lean meats and healthy fats Limit salt intake  Report new symptoms to the Cardiology team            PLAN Will follow up in three months    [name] [facility] Health RN Care Manager Direct Dial: [phone]  Fax: [phone] Website: [email or web address]

## 2023-09-28 NOTE — Telephone Encounter (Signed)
Pt aware and voiced understanding

## 2023-09-30 NOTE — Telephone Encounter (Signed)
Depends on whether or not we get his referral to [facility] set up.  If he has an appointment if they are, we can push it out to 6 months, but otherwise I will see him in 3

## 2023-10-02 NOTE — Patient Instructions (Addendum)
Thank you for allowing the Care Management team to participate in your care. It was great speaking with you!   Reminders: -Please continue to take your medications and wean off Toprol as instructed by the Cardiology team. -Please continue to assess your symptoms daily. Seek immediate medical attention if you experience worsening chest pain and worsening shortness of breath. -We will follow up on Dec 27, 2023 at 1100. I understand you are pending evaluation with the [facility] around this time. Please call if we need to reschedule our outreach.  Please do not hesitate to contact me if you require assistance prior to our scheduled outreach.  Stay Safe!   [name] [facility] Population Health RN Care Manager Direct Dial: [phone]  Fax: [phone] Website: [email or web address]

## 2023-10-02 NOTE — Telephone Encounter (Signed)
Patient identification verified by 2 forms. Luis Rail, RN    Called and spoke to patient  Patient states:   -he has not set up appointment with [facility]   -he is waiting for referral to be completed for [facility]  Patient scheduled for 35month OV, 5/6 at 8:00am  Patient agrees, no questions at this time

## 2023-10-09 ENCOUNTER — Encounter: Payer: Self-pay | Admitting: Cardiology

## 2023-10-09 NOTE — Telephone Encounter (Signed)
 Patient is following up. He says the referral for the [facility] will need to be e-mailed to the [place]. He says he dropped off information with the correct e-mail at the office a few weeks ago.

## 2023-10-17 NOTE — Telephone Encounter (Signed)
 Letter dictated

## 2023-10-30 ENCOUNTER — Encounter: Payer: Self-pay | Admitting: Cardiology

## 2023-10-31 ENCOUNTER — Ambulatory Visit: Attending: Internal Medicine | Admitting: Internal Medicine

## 2023-10-31 ENCOUNTER — Encounter: Payer: Self-pay | Admitting: Internal Medicine

## 2023-10-31 VITALS — BP 136/72 | HR 68 | Ht 73.0 in | Wt 193.6 lb

## 2023-10-31 DIAGNOSIS — R079 Chest pain, unspecified: Secondary | ICD-10-CM

## 2023-10-31 DIAGNOSIS — G8929 Other chronic pain: Secondary | ICD-10-CM | POA: Diagnosis not present

## 2023-10-31 NOTE — Patient Instructions (Addendum)
 Medication Instructions:  Your physician recommends that you continue on your current medications as directed. Please refer to the Current Medication list given to you today.  *If you need a refill on your cardiac medications before your next appointment, please call your pharmacy*   Follow-Up: At [facility], you and your health needs are our priority.  As part of our continuing mission to provide you with exceptional heart care, we have created designated Provider Care Teams.  These Care Teams include your primary Cardiologist (physician) and Advanced Practice Providers (APPs -  Physician Assistants and Nurse Practitioners) who all work together to provide you with the care you need, when you need it.  We recommend signing up for the patient portal called "MyChart".  Sign up information is provided on this After Visit Summary.  MyChart is used to connect with patients for Virtual Visits (Telemedicine).  Patients are able to view lab/test results, encounter notes, upcoming appointments, etc.  Non-urgent messages can be sent to your provider as well.   To learn more about what you can do with MyChart, go to [email or web address].    Your next appointment:   Tuesday Dec 19, 2023  Provider:   Bryan Lemma, MD     Other Instructions

## 2023-10-31 NOTE — Telephone Encounter (Signed)
 Called and left VMM to call back triage regarding more detailed information on symptoms and to offer an appointment.

## 2023-10-31 NOTE — Progress Notes (Signed)
  Cardiology Office Note:  .   Date:  10/31/2023  ID:  Luis, Guzman 1946-01-17, MRN [id number] PCP: Kristian Covey, MD  [facility] Providers Cardiologist:  Luis Lemma, MD    History of Present Illness: .   Luis Guzman is a 78 y.o. male with persistent angina, patient of Dr. Herbie Guzman. He has known patent stents on cath as well as a nonischemic PET. He was seen by Dr. Gala Guzman in Feb. who suggested this may be in the setting of pulmonary HTN related to exercise and started him on slidenafil 40 mg TID. He is on max antianginals.   He now comes in to DOD c/o chest pounds after starting slidenafil 40 mg TID.  Although he has reported some dizziness in 2023.  He has chronic chest pain.  He has had an unremarkable ischemic eval recently.  He called into triage who recommended he come in.  Today his vitals normal today as well as his ECG. Notes chest pounding and more chest pressure. Wife notes his heart rhythm feels normal.  He notes chest pounding in the office just sitting there at rest.  ROS:  per HPI otherwise negative.  Studies Reviewed: [name]        EKG Interpretation Date/Time:  Tuesday October 31 2023 15:08:30 EDT Ventricular Rate:  68 PR Interval:  160 QRS Duration:  92 QT Interval:  410 QTC Calculation: 435 R Axis:   38  Text Interpretation: Normal sinus rhythm Normal ECG When compared with ECG of 20-Sep-2023 09:06, No significant change was found Confirmed by Carolan Clines ([id number]) on 10/31/2023 3:18:05 PM  Risk Assessment/Calculations:        Physical Exam:   VS:  Vitals:   10/31/23 1503  BP: 136/72  Pulse: 68  SpO2: 96%    Wt Readings from Last 3 Encounters:  09/22/23 188 lb 9.6 oz (85.5 kg)  09/20/23 188 lb (85.3 kg)  06/14/23 192 lb 9.6 oz (87.4 kg)    GEN: Well nourished, well developed in no acute distress NECK: No JVD CARDIAC: RRR, no murmurs, rubs, gallops RESPIRATORY:  Clear to auscultation without rales, wheezing or rhonchi  ABDOMEN: Soft,  non-tender, non-distended EXTREMITIES:  No edema; No deformity   ASSESSMENT AND PLAN: .   Chest pounding -has had an ischemic eval recently with no obstructive lesions.  -He notes chest pounding in the office and his EKG was normal, no ischemic changes.  His vitals are normal.  He tapered off of his low-dose beta-blocker so do not think this is contributing. 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

## 2023-10-31 NOTE — Telephone Encounter (Signed)
 Patient returned my call and states is having intermittent dizziness and chest pressure that comes and goes without any specific triggers. Requesting to be seen today. DOD appointment made for today with Dr Wyline Mood at 3pm.

## 2023-11-12 ENCOUNTER — Other Ambulatory Visit (HOSPITAL_COMMUNITY): Payer: Self-pay | Admitting: Adult Health

## 2023-11-12 DIAGNOSIS — I10 Essential (primary) hypertension: Secondary | ICD-10-CM

## 2023-11-23 ENCOUNTER — Telehealth: Payer: Self-pay | Admitting: Cardiology

## 2023-11-23 NOTE — Telephone Encounter (Signed)
 Patient says [facility] needs a copy of any echocardiogram + heart cath(s) STAT. He's scheduled to see then Saturday, 4/12 at 5:30 AM and they must have this information prior to seeing him. He says they informed him they needed copies of any imaging. He says he was able to retrieve a CD copy from [place] to submit to the [facility], but they are still needing echo/heart cath(s) from provider. He says a CD copy of images would be the quickest method if possible.

## 2023-11-24 ENCOUNTER — Encounter: Payer: Self-pay | Admitting: Cardiology

## 2023-11-30 ENCOUNTER — Ambulatory Visit: Payer: No Typology Code available for payment source | Admitting: Family Medicine

## 2023-12-18 NOTE — Progress Notes (Signed)
 Cardiology Office Note:  .   Date:  12/25/2023  ID:  Luis Guzman, DOB 1945-11-07, MRN [id number] PCP: Marquetta Sit, MD  [facility] Providers Cardiologist:  Randene Bustard, MD {; CHF Cardiologist Dr[facility] Cardiologist: Dr. Lyndia Sans  Chief Complaint  Patient presents with   Follow-up    After [facility] consult    Patient Profile: .     Luis Guzman is a  78 y.o. male  with a PMH notable for 2 V CAD-PCI, HLD & Ongoing CP & Dyspnea both @ Rest & with Exertions who presents here for follow-up after outside referral to [facility] for third opinion  He was originally referred for anginal symptoms at the request of Marquetta Sit, MD.     Luis Guzman was referred to [facility] for third opinion.  He was seen by Dr. Lyndia Sans and evaluated with a chest CT scan with CPX/stress echo and a 24-hour monitor. => Recommendation was to reduce sildenafil  to 20 mg 3 times daily and then discontinue Ranexa .  If no change with this adjustment, would favor a 3 to 53-month trial of a secondary pulmonary vasodilator (endothelin receptor antagonist) macitentan/Opsumit 10 mg daily. Further recommendation would be coronary angiography with intracoronary physiology testing for microvascular disease.  Subjective  Discussed the use of AI scribe software for clinical note transcription with the patient, who gave verbal consent to proceed.  History of Present Illness History of Present Illness Luis Guzman is a 78 year old male with CAD and exertional pulmonary hypertension who presents with worsening shortness of breath and new symptoms of pounding and pressure as a follow-up from consultation at [facility].  He experiences worsening shortness of breath both at rest and with exertion, impacting daily activities such as walking, showering, and drying off. There is no associated drop in oxygen levels during exercise. No wheezing or chest tightness, but he  feels like he is 'not getting good air.'  Recently, he developed new symptoms of pounding and pressure, particularly noticeable when lying down at night. These symptoms began after discontinuing metoprolol  and increasing ranolazine . Although he has since stopped ranolazine , the symptoms persist.  He is currently on sildenafil , reduced from 40 mg to 20 mg, and has stopped taking Ranexa . He has not been taking Toprol  since it was discontinued. A recent trip to [place] involved extensive studies, but no new findings were reported. Previous tests, including a right heart catheterization, showed increased pulmonary pressures with exercise. A stress PET scan did not indicate coronary blood flow issues, and a VQ scan a month ago was negative for pulmonary embolism.  He has a history of COVID-19 but has not had any recent flu or pneumonia. He is currently experiencing a cold with cough and congestion. A D-dimer test was elevated, but he did not pursue further emergency evaluation. Pulmonary function tests were normal, despite some fibrotic changes noted on a CT scan. He has not seen a pulmonologist yet.     Objective   Medications - Sildenafil  20 mg (cut back from 40 mg) TID - Ranexa  500 mg twice daily weaned off - Toprol -XL 12.5 mg daily; irbesartan  150 mg daily; Norvasc  5 mg daily; furosemide  20 mg-3 tabs daily (with Klor-Con  40 mg daily) - Praluent  150 mg q. 14 weeks ==> Ordered but not started Opsumit 10 mg daily) -Zoloft  50 mg daily  Studies Reviewed: Aaron Aas        Tests from [facility] VQ scan 11/28/2023: Homogenous ventilation/perfusion.  No mismatch noted.  Small MAC defect in the right lower lobe consistent with a cystic area seen on prior imaging.  Mild scattered air trapping. PFTs 11/29/2023: Normal study.  Normal spirometry, diffusion capacity and pulse extremity at rest and exercise.  Inspiratory flow volume curve all normal. Chest CT showed very mild fibrotic changes in the posterior lung  bases and indeterminate pattern.  Posterior right lung base had a septated predominant gas-filled cystic lesion with smaller fluid also present in October 2024. => Recommend annual follow-up CPX with Stress Echo: Heart rate response to exercise is blunted slightly.  Oxygenation saturations at rest and exercise were normal close to 100%.  Peak VO2 was 18 mL/KG/min and he achieved 5.1 METS.  There was evidence of impaired cardiac output echocardiographically there was ventricle septal mid apical and anteroapical ischemia.  By Doppler left ventricular filling pressures were normal at rest and with exercise but there was evidence of exercise-induced pulm hypertension with RVSP of 30 mmHg at rest and 65 mmHg with exercise assuming RA of 5 mmHg. Exercise 5:16 minutes -> 5.3 METS terminated due to dyspnea and leg distress.  Peak heart rate of 120 bpm (84% MPHR achieved.  EKG negative for ischemia Exercise echo was positive for myocardial ischemia.  EF remained 60% with rest and peak stress-60%.. Normal LV filling pressures at rest and with exercise.  Normal lung aeration with rest and exercise. 24-hour monitor showed unremarkable findings with single PACs and PVCs.  Risk Assessment/Calculations:        Physical Exam:   VS:  BP 118/72   Pulse 72   Ht 6\' 1"  (1.854 m)   Wt 194 lb 3.2 oz (88.1 kg)   SpO2 92%   BMI 25.62 kg/m    Wt Readings from Last 3 Encounters:  12/25/23 193 lb (87.5 kg)  12/21/23 189 lb 6.4 oz (85.9 kg)  12/19/23 193 lb 9.6 oz (87.8 kg)    GEN: Healthy appearing.  Well nourished, well developed; in no acute distress NECK: No JVD; No carotid bruits CARDIAC: Normal S1, S2; RRR, no murmurs, rubs, gallops RESPIRATORY:  Clear to auscultation without rales, wheezing or rhonchi ; nonlabored, good air movement. ABDOMEN: Soft, non-tender, non-distended EXTREMITIES:  No edema; No deformity      ASSESSMENT AND PLAN: .    Problem List Items Addressed This Visit       Cardiology  Problems   Atypical angina (HCC) (Chronic)   Not exactly sure what his chest pain and dyspnea is related to, not likely to be microvascular disease based on cardiac cath and stress test results. - No benefit with Ranexa , weaned off now along with Imdur .  Will continue amlodipine  5 mg daily. -Convert beta-blocker from Toprol  to bisoprolol  for beta-1 selectivity. - Monitor effectiveness of Opsumit 10 mg      Relevant Medications   macitentan (OPSUMIT) 10 MG tablet   CAD S/P & DES PCI -ostial PDA and D1 (Chronic)   No longer on Plavix  because of significant bruising or bleeding.  On aspirin  monotherapy with 81 mg. -Okay to hold aspirin  5 to 7 days preop for surgeries or procedures.      Relevant Medications   macitentan (OPSUMIT) 10 MG tablet   Coronary artery disease involving native coronary artery of native heart with angina pectoris (HCC) - Primary (Chronic)   Multiple studies performed after PCI have shown no evidence of ischemia although the [facility] stress echo suggested in the  report that there may have been ischemia but the remainder of the body the report did not suggest. Treating for potential microvascular disease with Ranexa  did not seem to help.  Now weaned off of Ranexa . -Continue aspirin  81 mg daily. - Statin intolerant, continue Praluent  - No longer taking Toprol , with concerns for fatigue, we will try low-dose bisoprolol  2.5 mg daily - Continue amlodipine  5 mg daily for antianginal benefit with microvascular concern.      Relevant Medications   macitentan (OPSUMIT) 10 MG tablet   Essential (primary) hypertension (Chronic)   Blood pressure well-controlled with Norvasc  and Toprol , will continue Norvasc  5 earlier daily and switch Toprol  to bisoprolol  for more beta-1 selectivity. -Stop Toprol , start bisoprolol  2.5 mg daily      Relevant Medications   macitentan (OPSUMIT) 10 MG tablet   Hyperlipidemia with target LDL less than 70 - statin intolerant (Chronic)   On  Praluent  with well-controlled LDL 52.      Relevant Medications   macitentan (OPSUMIT) 10 MG tablet   Pulmonary hypertension, unspecified (HCC) (Chronic)   .Pulmonary hypertension with exercise-induced symptom exacerbation. Opsumit chosen for its benefits on pulmonary pressures and microvascular effects. Anticipated improved exercise tolerance and reduced pulmonary pressures. - Initiate Opsumit upon arrival of prescription (working through the issues with the PA). - Continue sildenafil  at reduced dose of 20 mg. - Monitor response to Opsumit over 3-4 weeks.      Relevant Medications   macitentan (OPSUMIT) 10 MG tablet     Other   Dyspnea on exertion with chest tightness (Chronic)   Chronic exertional dyspnea without oxygen desaturation. Suspected exercise-induced bronchospasm. Albuterol  inhaler suggested to assess for bronchospasm contribution. - Prescribe albuterol  inhaler for use before physical activity. - Evaluate response to albuterol  inhaler. - Switch from Toprol  to bisoprolol  for beta-1 selectivity.      Exertional chest pain (Chronic)   Refer to exertional dyspnea section      Myalgia due to statin (Chronic)   Intolerant of multiple different statins.  Now on Praluent .      Palpitations   Intermittent palpitations, more pronounced when supine.  - Would like to continue beta-blocker, we will switch from Toprol -XL to bisoprolol  2.5 mg daily for more beta-1 selectivity.. - Monitor for improvement in palpitations.              Follow-Up: Return in about 2 months (around 02/18/2024) for [employer]..  Recording duration: 27 minutes  I spent 49 minutes in the care of Luis Guzman today including reviewing labs (1 minute), reviewing outside studies (9 minutes reviewing outside studies listed above), face to face time discussing treatment options (27), reviewing records from [facility] visits x 2 (5 minutes), 7 minutes dictating, and documenting in the  encounter.     Signed, Arleen Lacer, MD, MS Randene Bustard, M.D., M.S. Interventional Chartered certified accountant  Pager # [phone]

## 2023-12-19 ENCOUNTER — Ambulatory Visit: Admitting: Family Medicine

## 2023-12-19 ENCOUNTER — Ambulatory Visit: Payer: No Typology Code available for payment source | Attending: Cardiology | Admitting: Cardiology

## 2023-12-19 ENCOUNTER — Encounter: Payer: Self-pay | Admitting: Family Medicine

## 2023-12-19 ENCOUNTER — Encounter: Payer: Self-pay | Admitting: Cardiology

## 2023-12-19 VITALS — BP 122/78 | HR 79 | Temp 98.3°F | Ht 73.0 in | Wt 193.6 lb

## 2023-12-19 VITALS — BP 118/72 | HR 72 | Ht 73.0 in | Wt 194.2 lb

## 2023-12-19 DIAGNOSIS — I10 Essential (primary) hypertension: Secondary | ICD-10-CM

## 2023-12-19 DIAGNOSIS — E785 Hyperlipidemia, unspecified: Secondary | ICD-10-CM

## 2023-12-19 DIAGNOSIS — J069 Acute upper respiratory infection, unspecified: Secondary | ICD-10-CM | POA: Diagnosis not present

## 2023-12-19 DIAGNOSIS — R002 Palpitations: Secondary | ICD-10-CM

## 2023-12-19 DIAGNOSIS — M791 Myalgia, unspecified site: Secondary | ICD-10-CM

## 2023-12-19 DIAGNOSIS — R062 Wheezing: Secondary | ICD-10-CM | POA: Diagnosis not present

## 2023-12-19 DIAGNOSIS — I25119 Atherosclerotic heart disease of native coronary artery with unspecified angina pectoris: Secondary | ICD-10-CM

## 2023-12-19 DIAGNOSIS — I2510 Atherosclerotic heart disease of native coronary artery without angina pectoris: Secondary | ICD-10-CM

## 2023-12-19 DIAGNOSIS — R079 Chest pain, unspecified: Secondary | ICD-10-CM

## 2023-12-19 DIAGNOSIS — I272 Pulmonary hypertension, unspecified: Secondary | ICD-10-CM | POA: Insufficient documentation

## 2023-12-19 DIAGNOSIS — Z9861 Coronary angioplasty status: Secondary | ICD-10-CM

## 2023-12-19 DIAGNOSIS — R0609 Other forms of dyspnea: Secondary | ICD-10-CM

## 2023-12-19 DIAGNOSIS — I2089 Other forms of angina pectoris: Secondary | ICD-10-CM

## 2023-12-19 DIAGNOSIS — T466X5D Adverse effect of antihyperlipidemic and antiarteriosclerotic drugs, subsequent encounter: Secondary | ICD-10-CM

## 2023-12-19 MED ORDER — HYDROCODONE BIT-HOMATROP MBR 5-1.5 MG/5ML PO SOLN: 5.0000 mL | Freq: Four times a day (QID) | ORAL | 0 refills | Status: DC | PRN

## 2023-12-19 MED ORDER — PREDNISONE 20 MG PO TABS: ORAL_TABLET | ORAL | 0 refills | Status: DC

## 2023-12-19 MED ORDER — ALBUTEROL SULFATE HFA 108 (90 BASE) MCG/ACT IN AERS: 2.0000 | INHALATION_SPRAY | Freq: Four times a day (QID) | RESPIRATORY_TRACT | 3 refills | Status: DC | PRN

## 2023-12-19 MED ORDER — ALBUTEROL SULFATE HFA 108 (90 BASE) MCG/ACT IN AERS: 2.0000 | INHALATION_SPRAY | Freq: Four times a day (QID) | RESPIRATORY_TRACT | 3 refills | Status: AC | PRN

## 2023-12-19 MED ORDER — BISOPROLOL-HYDROCHLOROTHIAZIDE 2.5-6.25 MG PO TABS: 1.0000 | ORAL_TABLET | Freq: Every day | ORAL | 3 refills | Status: DC

## 2023-12-19 MED ORDER — DOXYCYCLINE HYCLATE 100 MG PO CAPS: 100.0000 mg | ORAL_CAPSULE | Freq: Two times a day (BID) | ORAL | 0 refills | Status: DC

## 2023-12-19 NOTE — Progress Notes (Signed)
 Established Patient Office Visit  Subjective   Patient ID: Luis Guzman, male    DOB: [birth year]  Age: 78 y.o. MRN: [id number]  Chief Complaint  Patient presents with   Cough    Cough congestion, wheezing, runny nose, started a week ago, seen at [facility] on 5/3, tested for Flu and Covid- neg     HPI   Luis Guzman has history of abdominal aortic aneurysm, CAD, hypertension, pulmonary hypertension, hyperlipidemia.  Recent evaluation at [facility] clinic in [place]  for his chronic dyspnea.  He is followed also locally by cardiology here.  He is seen today with onset about a week ago of some nasal congestion cough, intermittent wheezing.  This past Saturday went to urgent care center affiliated with [facility].  Chest x-ray reportedly showed no acute findings.  Influenza and COVID testing negative.  He has been taking some Mucinex and Robitussin.  May feel just slightly better.  Apparently had several labs done at [facility].  D-dimer was elevated but he declined going in for further evaluation.  Denies any recent pleuritic pain or chest pain.  CBC reportedly normal.  Does have some wheezing and his cardiologist apparently sent in prescription for albuterol earlier today.  He is concerned about secondary infection.  Was told he probably had viral infection at [facility].  Penicillin allergy.  Past Medical History:  Diagnosis Date   Allergy    Anemia    as infant history of   CAD S/P PTCA & DES PCI - for Progressive Angina 02/26/2019   Cath-PCI 02/26/2019: EF 55-65%. mCx 65% (FFR 0.82 - Med Rx). D2 85% - Wolverine Scoring Balloon PTCA (2.0 mm). Ost rPDA - 90% -Resolute Onyx 2.5 x 15 (2.6 mm). 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 lower back pain, s/p cervical neck surgery"stiffness" remains   Transfusion history    infant "anemia"   Past Surgical History:  Procedure Laterality Date   APPENDECTOMY  ~ 1959   BACK SURGERY     CARDIAC CATHETERIZATION     CERVICAL DISC ARTHROPLASTY N/A 07/30/2018   Procedure: Cervical five-six Cervical six-seven Artificial disc replacement;  Surgeon: Elna Haggis, MD;  Location: [facility] OR;  Service: Neurosurgery;  Laterality: N/A;   CERVICAL LAMINECTOMY  1997   C 4 and C 5   CHOLECYSTECTOMY N/A 04/15/2014   Procedure: LAPAROSCOPIC CHOLECYSTECTOMY WITH INTRAOPERATIVE CHOLANGIOGRAM;  Surgeon: Oralee Billow, MD;  Location: [facility] ORS;  Service: General;  Laterality: N/A;   COLONOSCOPY     CORONARY BALLOON ANGIOPLASTY N/A 02/26/2019   Procedure: CORONARY BALLOON ANGIOPLASTY;  Surgeon: Arleen Lacer, MD;  Location: [facility] INVASIVE CV LAB;  Service: Cardiovascular -> scoring balloon PTCA (Wolverine 2.0 mm) of ostial D2 85% reducing to 20-30%.   CORONARY PRESSURE/FFR STUDY N/A 02/26/2019   Procedure: INTRAVASCULAR PRESSURE WIRE/FFR STUDY;  Surgeon: Arleen Lacer, MD;  Location: [facility] INVASIVE CV LAB;  Service: Cardiovascular;; mCx ~65% - DFR 0.92, FR 0.82 -  BORDERLINE--> MED Rx   CORONARY PRESSURE/FFR STUDY N/A 11/10/2021   Procedure: INTRAVASCULAR PRESSURE WIRE/FFR STUDY;  Surgeon: Arleen Lacer, MD;  Location: [facility] INVASIVE CV LAB;  Service: Cardiovascular;  Laterality: N/A;   CORONARY STENT INTERVENTION N/A 02/26/2019   Procedure: CORONARY STENT INTERVENTION;  Surgeon: Arleen Lacer, MD;  Location: [facility] INVASIVE CV LAB;; PCI ostial RPDA (initial attempt of PTCA only led to small local tear/dissection covered with stent) Resolute Onyx 2.5 mm x 15 mm (2.6 mm   CORONARY STENT INTERVENTION N/A 07/05/2019    Procedure: CORONARY STENT INTERVENTION;  Surgeon: Arleen Lacer, MD;  Location: [facility] INVASIVE CV LAB;  Service: Cardiovascular;; ostial D1 95% (restenosis of PTCA site) -> DES PCI RESOLUTE ONYX 2.25 mm x 50 mm (2.6 mm) positioned to avoid the true ostium.    ESOPHAGOGASTRODUODENOSCOPY N/A 03/13/2014   Procedure: ESOPHAGOGASTRODUODENOSCOPY (EGD);  Surgeon: Celedonio Coil, MD;  Location: [facility] ENDOSCOPY;  Service: Endoscopy;  Laterality: N/A;   EYE SURGERY     FRACTURE SURGERY     JOINT REPLACEMENT     LEFT HEART CATH AND CORONARY ANGIOGRAPHY N/A 02/26/2019   Procedure: LEFT HEART CATH AND CORONARY ANGIOGRAPHY;  Surgeon: Arleen Lacer, MD;  Location: [facility] INVASIVE CV LAB;  EF 55-65%. mCx 65% (FFR 0.82 - Med Rx). D2 85% - Wolverine Scoring Balloon PTCA (2.0 mm). Ost rPDA - 90% -Resolute Onyx 2.5 x 15 (2.6 mm).    LEFT HEART CATH AND CORONARY ANGIOGRAPHY N/A 07/05/2019   Procedure: LEFT HEART CATH AND CORONARY ANGIOGRAPHY;  Surgeon: Arleen Lacer, MD;  Location: [facility] INVASIVE CV LAB;; ostial D1 95% (restenosis of PTCA site) -> DES PCI.  PDA stent widely patent.  Mid CX stable 65% lesion.  Proximal RCA 40%.  Mid RCA 45%.  Normal EDP.   LEFT HEART CATH AND CORONARY ANGIOGRAPHY N/A 11/10/2021   Procedure: LEFT HEART CATH AND CORONARY ANGIOGRAPHY;  Surgeon: Arleen Lacer, MD;  Location: [facility] INVASIVE CV LAB;  Service: CV:: : Stable mid LCx with 5% (RFR 0.95).  Stable proximal RCA 40% and mid RCA 55%.  D1 and RPDA stents widely patent (   NM MYOVIEW  LTD  10/20/2021   Horizontal ST depressions in V4 and V5, frequent exercise-induced PVCs.  Inferior patient defect improves with stress-consistent with artifact.  Positive EKG, negative perfusion imaging.  Read as moderate risk study because of exertional ectopy.  Normal EF.   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   RIGHT HEART CATH N/A 01/26/2023   Procedure: RIGHT HEART CATH;  Surgeon: Mardell Shade, MD;  Location: [facility] INVASIVE CV LAB;   Service: Cardiovascular;  Laterality: N/A;   SHOULDER ARTHROSCOPY Bilateral    SHOULDER ARTHROSCOPY W/ ROTATOR CUFF REPAIR Bilateral 2004-2009   left 2004, right 2009   SPINE SURGERY     TOTAL HIP ARTHROPLASTY Left 03/04/2020   Procedure: TOTAL HIP ARTHROPLASTY ANTERIOR APPROACH;  Surgeon: Liliane Rei, MD;  Location: [facility] ORS;  Service: Orthopedics;  Laterality: Left;    TOTAL HIP ARTHROPLASTY Right 09/30/2020   Procedure: TOTAL HIP ARTHROPLASTY ANTERIOR APPROACH;  Surgeon: Liliane Rei, MD;  Location: [facility] ORS;  Service: Orthopedics;  Laterality: Right;    TRANSTHORACIC ECHOCARDIOGRAM  02/2020; 10/01/2020   a) EF 55 to 60%.  GR 1 DD.  No R WMA.  Trivial MR.  Otherwise normal.; b) EF 55 to 60%.  Normal LV function and no wall motion abnormality.  Normal RV.  Normal valves.Aaron Aas  TRANSTHORACIC ECHOCARDIOGRAM  10/18/2021   Normal LV function with a EF of 65%.  No RWMA.  GR 1 DD.  Normal valves.  Mild concentric LVH.  Mild MR and mild-moderate AI.   VASECTOMY     WISDOM TOOTH EXTRACTION      reports that he quit smoking about 55 years ago. His smoking use included cigarettes. He started smoking about 60 years ago. He has a 5 pack-year smoking history. He has never used smokeless tobacco. He reports that he does not currently use alcohol. He reports that he does not use drugs. family history includes Arthritis in his sister; Heart disease in his mother; Hypertension in his father and mother; Stroke (age of onset: 61) in his sister. Allergies  Allergen Reactions   Statins     Muscle pain   Penicillins Itching    Has patient had a PCN reaction causing immediate rash, facial/tongue/throat swelling, SOB or lightheadedness with hypotension: no Has patient had a PCN reaction causing severe rash involving mucus membranes or skin necrosis: unkn Has patient had a PCN reaction that required hospitalization: no Has patient had a PCN reaction occurring within the last 10 years: no If all of the  above answers are "NO", then may proceed with Cephalosporin use.  Tolerated Cephalosporin Date: 10/01/20.    Review of Systems  Constitutional:  Negative for chills and fever.  HENT:  Positive for congestion.   Respiratory:  Positive for cough and wheezing. Negative for hemoptysis.   Cardiovascular:  Negative for chest pain.      Objective:     BP 122/78 (BP Location: Left Arm, Patient Position: Sitting, Cuff Size: Normal)   Pulse 79   Temp 98.3 F (36.8 C) (Oral)   Ht 6\' 1"  (1.854 m)   Wt 193 lb 9.6 oz (87.8 kg)   SpO2 91%   BMI 25.54 kg/m  BP Readings from Last 3 Encounters:  12/19/23 122/78  12/19/23 118/72  10/31/23 136/72   Wt Readings from Last 3 Encounters:  12/19/23 193 lb 9.6 oz (87.8 kg)  12/19/23 194 lb 3.2 oz (88.1 kg)  10/31/23 193 lb 9.6 oz (87.8 kg)      Physical Exam Vitals reviewed.  Constitutional:      General: He is not in acute distress.    Appearance: He is not ill-appearing or toxic-appearing.  Cardiovascular:     Rate and Rhythm: Normal rate and regular rhythm.  Pulmonary:     Comments: Patient no respiratory stress at rest.  Does have some diffuse expiratory wheezes.  No rales.  No retractions. Neurological:     Mental Status: He is alert.      No results found for any visits on 12/19/23.    The 10-year ASCVD risk score (Arnett DK, et al., 2019) is: 47.2%    Assessment & Plan:   Acute upper respiratory infection with cough and wheezing.  Suspect viral trigger.  He does have multiple underlying chronic medical problems at high risk for complication.  Has wheezing on exam but in no respiratory distress.  We discussed the following  -Albuterol MDI 2 puffs every 4-6 hours as needed for cough and wheezing - Prednisone  20 mg 2 tablets daily for 5 days - Doxycycline  100 mg twice daily for 7 days - Follow-up immediately for any fever or increased shortness of breath  Glean Lamy, MD

## 2023-12-19 NOTE — Patient Instructions (Addendum)
 Medication Instructions:   Start bisoprolol -Hct 2.5 /6.25 mg   one tablet daily   Albuterol inhaler - as needed *If you need a refill on your cardiac medications before your next appointment, please call your pharmacy*   Lab Work: Not needed    Testing/Procedures: Not needed   Follow-Up: At [facility], you and your health needs are our priority.  As part of our continuing mission to provide you with exceptional heart care, we have created designated Provider Care Teams.  These Care Teams include your primary Cardiologist (physician) and Advanced Practice Providers (APPs -  Physician Assistants and Nurse Practitioners) who all work together to provide you with the care you need, when you need it.     Your next appointment:   2 month(s)  The format for your next appointment:   Virtual Visit    Provider:   Randene Bustard, MD  4 months in person  with Dr Addie Holstein  Other Instructions

## 2023-12-20 ENCOUNTER — Telehealth: Payer: Self-pay | Admitting: Pharmacy Technician

## 2023-12-20 NOTE — Telephone Encounter (Signed)
 Left message to patient will have change medication due to [place] does not have on there formulary  Unable to split medication because HCTZ does not come in 6.25 tab  \  Awaiting Dr Addie Holstein response

## 2023-12-20 NOTE — Telephone Encounter (Signed)
 Hi, please advise if this can be changed to atenolol/chlorthalidone per the [place]. It doesn't look like he has tried atenolol/chlorthalidone. Thank you!

## 2023-12-21 ENCOUNTER — Ambulatory Visit (INDEPENDENT_AMBULATORY_CARE_PROVIDER_SITE_OTHER): Payer: No Typology Code available for payment source | Admitting: Family Medicine

## 2023-12-21 ENCOUNTER — Encounter: Payer: Self-pay | Admitting: Family Medicine

## 2023-12-21 ENCOUNTER — Encounter: Payer: Self-pay | Admitting: Cardiology

## 2023-12-21 VITALS — BP 122/64 | Wt 189.4 lb

## 2023-12-21 DIAGNOSIS — Z0000 Encounter for general adult medical examination without abnormal findings: Secondary | ICD-10-CM

## 2023-12-21 NOTE — Progress Notes (Signed)
 PATIENT CHECK-IN and HEALTH RISK ASSESSMENT QUESTIONNAIRE:  -completed by phone/video for upcoming Medicare Preventive Visit  Pre-Visit Check-in: 1)Vitals (height, wt, BP, etc) - record in vitals section for visit on day of visit Request home vitals (wt, BP, etc.) and enter into vitals, THEN update Vital Signs SmartPhrase below at the top of the HPI. See below.  2)Review and Update Medications, Allergies PMH, Surgeries, Social history in Epic 3)Hospitalizations in the last year with date/reason? No  4)Review and Update Care Team (patient's specialists) in Epic 5) Complete PHQ9 in Epic  6) Complete Fall Screening in Epic 7)Review all Health Maintenance Due and order under PCP if not done.  Medicare Wellness Patient Questionnaire:  Answer theses question about your habits: How often do you have a drink containing alcohol?N/A Have you ever smoked? Yes  Quit date if applicable? 40 years   How many packs a day do/did you smoke? 1/2 Do you use smokeless tobacco?No Do you use an illicit drugs?No On average, how many days per week do you engage in moderate to strenuous exercise (like a brisk walk)?N/A - has pulm hypertension and cardiac disease, reports his specialist have told him to get started the new medication, then start working with walking with resting On average, how many minutes do you engage in exercise at this level?N/A Typical breakfast: Varies, grits, fruit, sometimes skips breakfast Typical lunch/dinner: varies, sometimes cooks, sometimes goes out Typical snacks: Varies   Beverages: Tea, carbonated water    Answer theses question about your everyday activities: Can you perform most household chores? some Are you deaf or have significant trouble hearing?Yes  Do you feel that you have a problem with memory?Sometimes  Do you feel safe at home? Yes  Last dentist visit? 2 months ago  8. Do you have any difficulty performing your everyday activities?Yes  Are you having any  difficulty walking, taking medications on your own, and or difficulty managing daily home needs?No Do you have difficulty walking or climbing stairs?Yes  Do you have difficulty dressing or bathing? No Do you have difficulty doing errands alone such as visiting a doctor's office or shopping? No Do you currently have any difficulty preparing food and eating? NO Do you currently have any difficulty using the toilet?No Do you have any difficulty managing your finances?No Do you have any difficulties with housekeeping of managing your housekeeping?No   Do you have Advanced Directives in place (Living Will, Healthcare Power or Attorney)? Yes    [facility]    Do you currently use prescribed or non-prescribed narcotic or opioid pain medications? No  Do you have a history or close family history of breast, ovarian, tubal or peritoneal cancer or a family member with BRCA (breast cancer susceptibility 1 and 2) gene mutations? No    ----------------------------------------------------------------------------------------------------------------------------------------------------------------------------------------------------------------------  Because this visit was a virtual/telehealth visit, some criteria may be missing or patient reported. Any vitals not documented were not able to be obtained and vitals that have been documented are patient reported.    MEDICARE ANNUAL PREVENTIVE CARE VISIT WITH PROVIDER (Welcome to Medicare, initial annual wellness or annual wellness exam)  Virtual Visit via Video Note  I connected with LYNK LABERGE on 12/21/23  by a video enabled telemedicine application and verified that I am speaking with the correct person using two identifiers.  Location patient: home Location provider:work or home office Persons participating in the virtual visit: patient, provider  Concerns and/or follow up today: reports doing ok  today, recovering  from resp bug and saw Dr. B a few days ago.   See HM section in Epic for other details of completed HM.    ROS: negative for report of fevers, unintentional weight loss, vision changes, vision loss, hearing loss or change, chest pain, sob, hemoptysis, melena, hematochezia, hematuria, falls, bleeding or bruising, thoughts of suicide or self harm, memory loss  Patient-completed extensive health risk assessment - reviewed and discussed with the patient: See Health Risk Assessment completed with patient prior to the visit either above or in recent phone note. This was reviewed in detailed with the patient today and appropriate recommendations, orders and referrals were placed as needed per Summary below and patient instructions.   Review of Medical History: -PMH, PSH, Family History and current specialty and care providers reviewed and updated and listed below   Patient Care Team: Marquetta Sit, MD as PCP - General Arleen Lacer, MD as PCP - Cardiology (Cardiology) Elna Haggis, MD as Consulting Physician (Neurosurgery) Shirlee Dotter, MD (Inactive) as Consulting Physician (Orthopedic Surgery) Roxie Cord, RN as Case Manager   Past Medical History:  Diagnosis Date   Allergy    Anemia    as infant history of   CAD S/P PTCA & DES PCI - for Progressive Angina 02/26/2019   Cath-PCI 02/26/2019: EF 55-65%. mCx 65% (FFR 0.82 - Med Rx). D2 85% - Wolverine Scoring Balloon PTCA (2.0 mm). Ost rPDA - 90% -Resolute Onyx 2.5 x 15 (2.6 mm). 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 lower back pain, s/p cervical neck surgery"stiffness" remains   Transfusion history    infant "anemia"    Past Surgical History:  Procedure Laterality Date   APPENDECTOMY  ~ 1959   BACK SURGERY     CARDIAC CATHETERIZATION     CERVICAL DISC ARTHROPLASTY N/A 07/30/2018   Procedure: Cervical five-six Cervical six-seven Artificial disc replacement;  Surgeon: Elna Haggis, MD;  Location: [facility] OR;  Service: Neurosurgery;  Laterality: N/A;   CERVICAL LAMINECTOMY  1997   C 4 and C 5   CHOLECYSTECTOMY N/A 04/15/2014   Procedure: LAPAROSCOPIC CHOLECYSTECTOMY WITH INTRAOPERATIVE CHOLANGIOGRAM;  Surgeon: Oralee Billow, MD;  Location: [facility] ORS;  Service: General;  Laterality: N/A;   COLONOSCOPY     CORONARY BALLOON ANGIOPLASTY N/A 02/26/2019   Procedure: CORONARY BALLOON ANGIOPLASTY;  Surgeon: Arleen Lacer, MD;  Location: [facility] INVASIVE CV LAB;  Service: Cardiovascular -> scoring balloon PTCA (Wolverine 2.0 mm) of ostial D2 85% reducing to 20-30%.   CORONARY PRESSURE/FFR STUDY N/A 02/26/2019   Procedure: INTRAVASCULAR PRESSURE WIRE/FFR STUDY;  Surgeon: Arleen Lacer, MD;  Location: [facility] INVASIVE CV LAB;  Service: Cardiovascular;; mCx ~65% - DFR 0.92, FR 0.82 - BORDERLINE--> MED Rx   CORONARY PRESSURE/FFR STUDY N/A 11/10/2021   Procedure: INTRAVASCULAR PRESSURE WIRE/FFR STUDY;  Surgeon: Arleen Lacer, MD;  Location: [facility] INVASIVE CV LAB;  Service: Cardiovascular;  Laterality: N/A;   CORONARY STENT INTERVENTION  N/A 02/26/2019   Procedure: CORONARY STENT INTERVENTION;  Surgeon: Arleen Lacer, MD;  Location: [facility] INVASIVE CV LAB;; PCI ostial RPDA (initial attempt of PTCA only led to small local tear/dissection covered with stent) Resolute Onyx 2.5 mm x 15 mm (2.6 mm   CORONARY STENT INTERVENTION N/A 07/05/2019   Procedure: CORONARY STENT INTERVENTION;  Surgeon: Arleen Lacer, MD;  Location: [facility] INVASIVE CV LAB;  Service: Cardiovascular;; ostial D1 95%  (restenosis of PTCA site) -> DES PCI RESOLUTE ONYX 2.25 mm x 50 mm (2.6 mm) positioned to avoid the true ostium.    ESOPHAGOGASTRODUODENOSCOPY N/A 03/13/2014   Procedure: ESOPHAGOGASTRODUODENOSCOPY (EGD);  Surgeon: Celedonio Coil, MD;  Location: [facility] ENDOSCOPY;  Service: Endoscopy;  Laterality: N/A;   EYE SURGERY     FRACTURE SURGERY     JOINT REPLACEMENT     LEFT HEART CATH AND CORONARY ANGIOGRAPHY N/A 02/26/2019   Procedure: LEFT HEART CATH AND CORONARY ANGIOGRAPHY;  Surgeon: Arleen Lacer, MD;  Location: [facility] INVASIVE CV LAB;  EF 55-65%. mCx 65% (FFR 0.82 - Med Rx). D2 85% - Wolverine Scoring Balloon PTCA (2.0 mm). Ost rPDA - 90% -Resolute Onyx 2.5 x 15 (2.6 mm).    LEFT HEART CATH AND CORONARY ANGIOGRAPHY N/A 07/05/2019   Procedure: LEFT HEART CATH AND CORONARY ANGIOGRAPHY;  Surgeon: Arleen Lacer, MD;  Location: [facility] INVASIVE CV LAB;; ostial D1 95% (restenosis of PTCA site) -> DES PCI.  PDA stent widely patent.  Mid CX stable 65% lesion.  Proximal RCA 40%.  Mid RCA 45%.  Normal EDP.   LEFT HEART CATH AND CORONARY ANGIOGRAPHY N/A 11/10/2021   Procedure: LEFT HEART CATH AND CORONARY ANGIOGRAPHY;  Surgeon: Arleen Lacer, MD;  Location: [facility] INVASIVE CV LAB;  Service: CV:: : Stable mid LCx with 5% (RFR 0.95).  Stable proximal RCA 40% and mid RCA 55%.  D1 and RPDA stents widely patent (   NM MYOVIEW  LTD  10/20/2021   Horizontal ST depressions in V4 and V5, frequent exercise-induced PVCs.  Inferior patient defect improves with stress-consistent with artifact.  Positive EKG, negative perfusion imaging.  Read as moderate risk study because of exertional ectopy.  Normal EF.   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   RIGHT HEART CATH N/A 01/26/2023   Procedure: RIGHT HEART CATH;  Surgeon: Mardell Shade, MD;  Location: [facility] INVASIVE CV LAB;  Service: Cardiovascular;  Laterality: N/A;   SHOULDER ARTHROSCOPY Bilateral    SHOULDER ARTHROSCOPY W/ ROTATOR CUFF REPAIR Bilateral 2004-2009    left 2004, right 2009   SPINE SURGERY     TOTAL HIP ARTHROPLASTY Left 03/04/2020   Procedure: TOTAL HIP ARTHROPLASTY ANTERIOR APPROACH;  Surgeon: Liliane Rei, MD;  Location: [facility] ORS;  Service: Orthopedics;  Laterality: Left;    TOTAL HIP ARTHROPLASTY Right 09/30/2020   Procedure: TOTAL HIP ARTHROPLASTY ANTERIOR APPROACH;  Surgeon: Liliane Rei, MD;  Location: [facility] ORS;  Service: Orthopedics;  Laterality: Right;    TRANSTHORACIC ECHOCARDIOGRAM  02/2020; 10/01/2020   a) EF 55 to 60%.  GR 1 DD.  No R WMA.  Trivial MR.  Otherwise normal.; b) EF 55 to 60%.  Normal LV function and no wall motion abnormality.  Normal RV.  Normal valves..   TRANSTHORACIC ECHOCARDIOGRAM  10/18/2021   Normal LV function with a EF of 65%.  No RWMA.  GR 1 DD.  Normal valves.  Mild concentric LVH.  Mild MR and mild-moderate AI.   VASECTOMY  WISDOM TOOTH EXTRACTION      Social History   Socioeconomic History   Marital status: Married    Spouse name: Not on file   Number of children: Not on file   Years of education: 16   Highest education level: Bachelor's degree (e.g., BA, AB, BS)  Occupational History   Not on file  Tobacco Use   Smoking status: Former    Current packs/day: 0.00    Average packs/day: 1 pack/day for 5.0 years (5.0 ttl pk-yrs)    Types: Cigarettes    Start date: 10/18/1963    Quit date: 10/17/1968    Years since quitting: 55.2   Smokeless tobacco: Never  Vaping Use   Vaping status: Never Used  Substance and Sexual Activity   Alcohol use: Not Currently    Comment: "quit drinking ~ 10/1968   Drug use: No   Sexual activity: Not on file  Other Topics Concern   Not on file  Social History Narrative   Not on file   Social Drivers of Health   Financial Resource Strain: Low Risk  (12/17/2023)   Overall Financial Resource Strain (CARDIA)    Difficulty of Paying Living Expenses: Not hard at all  Food Insecurity: No Food Insecurity (12/17/2023)   Hunger Vital Sign    Worried  About Running Out of Food in the Last Year: Never true    Ran Out of Food in the Last Year: Never true  Transportation Needs: No Transportation Needs (12/17/2023)   PRAPARE - Administrator, Civil Service (Medical): No    Lack of Transportation (Non-Medical): No  Physical Activity: Inactive (12/17/2023)   Exercise Vital Sign    Days of Exercise per Week: 0 days    Minutes of Exercise per Session: 30 min  Stress: No Stress Concern Present (12/17/2023)   [employer] of Occupational Health - Occupational Stress Questionnaire    Feeling of Stress : Not at all  Social Connections: Unknown (12/17/2023)   Social Connection and Isolation Panel [NHANES]    Frequency of Communication with Friends and Family: More than three times a week    Frequency of Social Gatherings with Friends and Family: Twice a week    Attends Religious Services: Patient declined    Database administrator or Organizations: No    Attends Engineer, structural: More than 4 times per year    Marital Status: Married  Catering manager Violence: Not At Risk (05/07/2021)   Humiliation, Afraid, Rape, and Kick questionnaire    Fear of Current or Ex-Partner: No    Emotionally Abused: No    Physically Abused: No    Sexually Abused: No    Family History  Problem Relation Age of Onset   Hypertension Mother    Heart disease Mother    Hypertension Father    Arthritis Sister        rheumatoid   Stroke Sister 56    Current Outpatient Medications on File Prior to Visit  Medication Sig Dispense Refill   acetaminophen  (TYLENOL ) 500 MG tablet Take 1,000 mg by mouth every 6 (six) hours as needed for moderate pain.     albuterol (VENTOLIN HFA) 108 (90 Base) MCG/ACT inhaler Inhale 2 puffs into the lungs every 6 (six) hours as needed for wheezing or shortness of breath. 8 g 3   Alirocumab  (PRALUENT ) 150 MG/ML SOAJ Inject 1 mL (150 mg total) into the skin every 14 (fourteen) days. 6 mL 3   amLODipine  (NORVASC )  5 MG  tablet Take 1 tablet (5 mg total) by mouth daily. 90 tablet 3   aspirin  EC 81 MG tablet Take 1 tablet (81 mg total) by mouth daily. Swallow whole. ( start after stopping clopidogrel  75 mg) 90 tablet 3   bimatoprost (LUMIGAN) 0.03 % ophthalmic solution Place 1 drop into both eyes at bedtime.     Brinzolamide-Brimonidine (SIMBRINZA) 1-0.2 % SUSP Place 1 drop into both eyes in the morning and at bedtime.     doxycycline  (VIBRAMYCIN ) 100 MG capsule Take 1 capsule (100 mg total) by mouth 2 (two) times daily. 14 capsule 0   furosemide  (LASIX ) 20 MG tablet TAKE 3 TABLETS BY MOUTH EVERY DAY 270 tablet 1   HYDROcodone  bit-homatropine (HYCODAN) 5-1.5 MG/5ML syrup Take 5 mLs by mouth every 6 (six) hours as needed. 120 mL 0   ibuprofen (ADVIL) 200 MG tablet Take 400 mg by mouth every 6 (six) hours as needed for moderate pain.     irbesartan  (AVAPRO ) 150 MG tablet Take 1 tablet (150 mg total) by mouth daily. 90 tablet 3   macitentan (OPSUMIT) 10 MG tablet Take 10 mg by mouth daily.     metoprolol  succinate (TOPROL -XL) 25 MG 24 hr tablet Take 0.5 tablets (12.5 mg total) by mouth daily. 45 tablet 3   potassium chloride  (KLOR-CON  M) 10 MEQ tablet Take 40 mEq by mouth daily.     predniSONE  (DELTASONE ) 20 MG tablet Take two tablets by mouth once daily for 5 days 10 tablet 0   ranolazine  (RANEXA ) 500 MG 12 hr tablet Take 2 tablets (1000mg  total) in AM and 1 tablet (500mg  ) in evening. 1500mg  total daily 90 tablet 6   sertraline  (ZOLOFT ) 50 MG tablet Take 25 mg by mouth daily.     sildenafil  (REVATIO ) 20 MG tablet Take 2 tablets (40 mg total) by mouth 3 (three) times daily. 540 tablet 2   traZODone  (DESYREL ) 50 MG tablet Take one to two tablets by mouth at night as needed for insomnia. 180 tablet 3   No current facility-administered medications on file prior to visit.    Allergies  Allergen Reactions   Statins     Muscle pain   Penicillins Itching    Has patient had a PCN reaction causing immediate rash,  facial/tongue/throat swelling, SOB or lightheadedness with hypotension: no Has patient had a PCN reaction causing severe rash involving mucus membranes or skin necrosis: unkn Has patient had a PCN reaction that required hospitalization: no Has patient had a PCN reaction occurring within the last 10 years: no If all of the above answers are "NO", then may proceed with Cephalosporin use.  Tolerated Cephalosporin Date: 10/01/20.       Physical Exam Vitals requested from patient and listed below if patient had equipment and was able to obtain at home for this virtual visit: Vitals:   12/21/23 0830  BP: 122/64   Estimated body mass index is 24.99 kg/m as calculated from the following:   Height as of 12/19/23: 6\' 1"  (1.854 m).   Weight as of this encounter: 189 lb 6.4 oz (85.9 kg).  EKG (optional): deferred due to virtual visit  GENERAL: alert, oriented, no acute distress detected; full vision exam deferred due to pandemic and/or virtual encounter  HEENT: atraumatic, conjunttiva clear, no obvious abnormalities on inspection of external nose and ears  NECK: normal movements of the head and neck  LUNGS: on inspection no signs of respiratory distress, breathing rate appears normal, no obvious gross  SOB, gasping or wheezing  CV: no obvious cyanosis  MS: moves all visible extremities without noticeable abnormality  PSYCH/NEURO: pleasant and cooperative, no obvious depression or anxiety, speech and thought processing grossly intact, Cognitive function grossly intact  Flowsheet Row Clinical Support from 12/21/2023 in [facility] [place]  PHQ-9 Total Score 1           12/21/2023    9:05 AM 12/21/2023    8:31 AM 03/14/2023    9:35 AM 08/22/2022   11:55 AM 05/20/2022    8:58 AM  Depression screen PHQ 2/9  Decreased Interest 0 0 0 0 0  Down, Depressed, Hopeless 0 0 0 0 0  PHQ - 2 Score 0 0 0 0 0  Altered sleeping  0 1    Tired, decreased energy  1 1    Change in  appetite  0 0    Feeling bad or failure about yourself   0 0    Trouble concentrating  0 0    Moving slowly or fidgety/restless  0 0    Suicidal thoughts  0 0    PHQ-9 Score  1 2    Difficult doing work/chores  Not difficult at all Not difficult at all         04/26/2023    9:06 AM 04/28/2023    8:32 AM 05/01/2023    8:27 AM 12/21/2023    8:32 AM 12/21/2023    9:05 AM  Fall Risk  Falls in the past year? 0 0 0 0 0  Was there an injury with Fall? 0 0 0 0 0  Fall Risk Category Calculator 0 0 0 0 0  Patient at Risk for Falls Due to No Fall Risks No Fall Risks No Fall Risks No Fall Risks   Fall risk Follow up Falls evaluation completed Falls evaluation completed Falls evaluation completed Falls evaluation completed Falls evaluation completed;Education provided     SUMMARY AND PLAN:  Encounter for Medicare annual wellness exam  Discussed applicable health maintenance/preventive health measures and advised and referred or ordered per patient preferences: -his labs are well below diabetic range, reports may have been elvated at one oint but then he underwent program at the [place] and lost weight and has not had issues since -reports sees eye doc next week Health Maintenance  Topic Date Due   COVID-19 Vaccine (10 - Mixed Product risk 2024-25 season) 01/07/2024   INFLUENZA VACCINE  03/15/2024   Medicare Annual Wellness (AWV)  12/20/2024   DTaP/Tdap/Td (7 - Td or Tdap) 09/17/2025   Pneumonia Vaccine 70+ Years old  Completed   Hepatitis C Screening  Completed   Zoster Vaccines- Shingrix  Completed   HPV VACCINES  Aged Out   Meningococcal B Vaccine  Aged Out   Colonoscopy  Discontinued     Education and counseling on the following was provided based on the above review of health and a plan/checklist for the patient, along with additional information discussed, was provided for the patient in the patient instructions :  -Provided counseling and plan for increased risk of falling if applicable  per above screening. Reviewed and demonstrated safe balance exercises that can be done at home to improve balance and discussed exercise guidelines for adults with include balance exercises at least 3 days per week.  -Advised and counseled on a healthy lifestyle  -Reviewed patient's current diet. Advised and counseled on a whole foods based healthy diet. A summary of a healthy diet was  provided in the Patient Instructions.  -reviewed patient's current physical activity level and discussed exercise guidelines for adults. Discussed ideas for safe exercise at home to assist in meeting exercise guideline recommendations in a safe and healthy way. Further resources provided in patient instructions.  -Advise yearly dental visits at minimum and regular eye exams   Follow up: see patient instructions   Patient Instructions  I really enjoyed getting to talk with you today! I am available on Tuesdays and Thursdays for virtual visits if you have any questions or concerns, or if I can be of any further assistance.   CHECKLIST FROM ANNUAL WELLNESS VISIT:  -Follow up (please call to schedule if not scheduled after visit):   -yearly for annual wellness visit with primary care office  Here is a list of your preventive care/health maintenance measures and the plan for each if any are due:  PLAN For any measures below that may be due:   Health Maintenance  Topic Date Due   COVID-19 Vaccine (10 - Mixed Product risk 2024-25 season) 01/07/2024   INFLUENZA VACCINE  03/15/2024   Medicare Annual Wellness (AWV)  12/20/2024   DTaP/Tdap/Td (7 - Td or Tdap) 09/17/2025   Pneumonia Vaccine 2+ Years old  Completed   Hepatitis C Screening  Completed   Zoster Vaccines- Shingrix  Completed   HPV VACCINES  Aged Out   Meningococcal B Vaccine  Aged Out   Colonoscopy  Discontinued    -See a dentist at least yearly  -Get your eyes checked and then per your eye specialist's recommendations  -Other issues addressed  today:   -I have included below further information regarding a healthy whole foods based diet, physical activity guidelines for adults, stress management and opportunities for social connections. I hope you find this information useful.   -----------------------------------------------------------------------------------------------------------------------------------------------------------------------------------------------------------------------------------------------------------    NUTRITION: -eat real food: lots of colorful vegetables (half the plate) and fruits -5-7 servings of vegetables and fruits per day (fresh or steamed is best), exp. 2 servings of vegetables with lunch and dinner and 2 servings of fruit per day. Berries and greens such as kale and collards are great choices.  -consume on a regular basis:  fresh fruits, fresh veggies, fish, nuts, seeds, healthy oils (such as olive oil, avocado oil), whole grains (make sure for bread/pasta/crackers/etc., that the first ingredient on label contains the word "whole"), legumes. -can eat small amounts of dairy and lean meat (no larger than the palm of your hand), but avoid processed meats such as ham, bacon, lunch meat, etc. -drink water  -try to avoid fast food and pre-packaged foods, processed meat, ultra processed foods/beverages (donuts, candy, etc.) -most experts advise limiting sodium to < 2300mg  per day, should limit further is any chronic conditions such as high blood pressure, heart disease, diabetes, etc. The American Heart Association advised that < 1500mg  is is ideal -try to avoid foods/beverages that contain any ingredients with names you do not recognize  -try to avoid foods/beverages  with added sugar or sweeteners/sweets  -try to avoid sweet drinks (including diet drinks): soda, juice, Gatorade, sweet tea, power drinks, diet drinks -try to avoid white rice, white bread, pasta (unless whole grain)  EXERCISE GUIDELINES  FOR ADULTS: -if you wish to increase your physical activity, do so gradually and with the approval of your doctor -STOP and seek medical care immediately if you have any chest pain, chest discomfort or trouble breathing when starting or increasing exercise  -move and stretch your body, legs, feet and arms  when sitting for long periods -Physical activity guidelines for optimal health in adults: -get at least 150 minutes per week of moderate exercise (can talk, but not sing); this is about 20-30 minutes of sustained activity 5-7 days per week or two 10-15 minute episodes of sustained activity 5-7 days per week -do some muscle building/resistance training/strength training at least 2 days per week  -balance exercises 3+ days per week:   Stand somewhere where you have something sturdy to hold onto if you lose balance    1) lift up on toes, then back down, start with 5x per day and work up to 20x   2) stand and lift one leg straight out to the side so that foot is a few inches of the floor, start with 5x each side and work up to 20x each side   3) stand on one foot, start with 5 seconds each side and work up to 20 seconds on each side  If you need ideas or help with getting more active:  -Silver sneakers https://tools.silversneakers.com  -Walk with a Doc: http://www.duncan-williams.com/  -try to include resistance (weight lifting/strength building) and balance exercises twice per week: or the following link for ideas: http://castillo-powell.com/  BuyDucts.dk  STRESS MANAGEMENT: -can try meditating, or just sitting quietly with deep breathing while intentionally relaxing all parts of your body for 5 minutes daily -if you need further help with stress, anxiety or depression please follow up with your primary doctor or contact the wonderful folks at [facility]: [phone]  SOCIAL  CONNECTIONS: -options in [place] if you wish to engage in more social and exercise related activities:  -Silver sneakers https://tools.silversneakers.com  -Walk with a Doc: http://www.duncan-williams.com/  -Check out the [facility] Active Adults 50+ section on the [place] of [employer] (hiking clubs, book clubs, cards and games, chess, exercise classes, aquatic classes and much more) - see the website for details: https://www.[place].gov/departments/parks-recreation/active-adults50  -YouTube has lots of exercise videos for different ages and abilities as well  -[facility] (a variety of indoor and outdoor inperson activities for adults). [phone]. [address]  -Virtual Online Classes (a variety of topics): see seniorplanet.org or call [phone]  -consider volunteering at a school, hospice center, church, senior center or elsewhere            Maurie Southern, DO

## 2023-12-21 NOTE — Telephone Encounter (Signed)
 Just take the HCTZ every other day.

## 2023-12-21 NOTE — Patient Instructions (Signed)
 I really enjoyed getting to talk with you today! I am available on Tuesdays and Thursdays for virtual visits if you have any questions or concerns, or if I can be of any further assistance.   CHECKLIST FROM ANNUAL WELLNESS VISIT:  -Follow up (please call to schedule if not scheduled after visit):   -yearly for annual wellness visit with primary care office  Here is a list of your preventive care/health maintenance measures and the plan for each if any are due:  PLAN For any measures below that may be due:   Health Maintenance  Topic Date Due   COVID-19 Vaccine (10 - Mixed Product risk 2024-25 season) 01/07/2024   INFLUENZA VACCINE  03/15/2024   Medicare Annual Wellness (AWV)  12/20/2024   DTaP/Tdap/Td (7 - Td or Tdap) 09/17/2025   Pneumonia Vaccine 61+ Years old  Completed   Hepatitis C Screening  Completed   Zoster Vaccines- Shingrix  Completed   HPV VACCINES  Aged Out   Meningococcal B Vaccine  Aged Out   Colonoscopy  Discontinued    -See a dentist at least yearly  -Get your eyes checked and then per your eye specialist's recommendations  -Other issues addressed today:   -I have included below further information regarding a healthy whole foods based diet, physical activity guidelines for adults, stress management and opportunities for social connections. I hope you find this information useful.   -----------------------------------------------------------------------------------------------------------------------------------------------------------------------------------------------------------------------------------------------------------    NUTRITION: -eat real food: lots of colorful vegetables (half the plate) and fruits -5-7 servings of vegetables and fruits per day (fresh or steamed is best), exp. 2 servings of vegetables with lunch and dinner and 2 servings of fruit per day. Berries and greens such as kale and collards are great choices.  -consume on a regular  basis:  fresh fruits, fresh veggies, fish, nuts, seeds, healthy oils (such as olive oil, avocado oil), whole grains (make sure for bread/pasta/crackers/etc., that the first ingredient on label contains the word "whole"), legumes. -can eat small amounts of dairy and lean meat (no larger than the palm of your hand), but avoid processed meats such as ham, bacon, lunch meat, etc. -drink water  -try to avoid fast food and pre-packaged foods, processed meat, ultra processed foods/beverages (donuts, candy, etc.) -most experts advise limiting sodium to < 2300mg  per day, should limit further is any chronic conditions such as high blood pressure, heart disease, diabetes, etc. The American Heart Association advised that < 1500mg  is is ideal -try to avoid foods/beverages that contain any ingredients with names you do not recognize  -try to avoid foods/beverages  with added sugar or sweeteners/sweets  -try to avoid sweet drinks (including diet drinks): soda, juice, Gatorade, sweet tea, power drinks, diet drinks -try to avoid white rice, white bread, pasta (unless whole grain)  EXERCISE GUIDELINES FOR ADULTS: -if you wish to increase your physical activity, do so gradually and with the approval of your doctor -STOP and seek medical care immediately if you have any chest pain, chest discomfort or trouble breathing when starting or increasing exercise  -move and stretch your body, legs, feet and arms when sitting for long periods -Physical activity guidelines for optimal health in adults: -get at least 150 minutes per week of moderate exercise (can talk, but not sing); this is about 20-30 minutes of sustained activity 5-7 days per week or two 10-15 minute episodes of sustained activity 5-7 days per week -do some muscle building/resistance training/strength training at least 2 days per week  -balance exercises 3+  days per week:   Stand somewhere where you have something sturdy to hold onto if you lose balance    1)  lift up on toes, then back down, start with 5x per day and work up to 20x   2) stand and lift one leg straight out to the side so that foot is a few inches of the floor, start with 5x each side and work up to 20x each side   3) stand on one foot, start with 5 seconds each side and work up to 20 seconds on each side  If you need ideas or help with getting more active:  -Silver sneakers https://tools.silversneakers.com  -Walk with a Doc: http://www.duncan-williams.com/  -try to include resistance (weight lifting/strength building) and balance exercises twice per week: or the following link for ideas: http://castillo-powell.com/  BuyDucts.dk  STRESS MANAGEMENT: -can try meditating, or just sitting quietly with deep breathing while intentionally relaxing all parts of your body for 5 minutes daily -if you need further help with stress, anxiety or depression please follow up with your primary doctor or contact the wonderful folks at [facility]: [phone]  SOCIAL CONNECTIONS: -options in [place] if you wish to engage in more social and exercise related activities:  -Silver sneakers https://tools.silversneakers.com  -Walk with a Doc: http://www.duncan-williams.com/  -Check out the [facility] Active Adults 50+ section on the [place] of [employer] (hiking clubs, book clubs, cards and games, chess, exercise classes, aquatic classes and much more) - see the website for details: https://www.[place].gov/departments/parks-recreation/active-adults50  -YouTube has lots of exercise videos for different ages and abilities as well  -[facility] (a variety of indoor and outdoor inperson activities for adults). [phone]. [address].  -Virtual Online Classes (a variety of topics): see seniorplanet.org or call [phone]  -consider volunteering at a school, hospice  center, church, senior center or elsewhere

## 2023-12-21 NOTE — Progress Notes (Signed)
 Patient unable to obtain vital signs due to telehealh visit

## 2023-12-25 ENCOUNTER — Encounter: Payer: Self-pay | Admitting: Family Medicine

## 2023-12-25 ENCOUNTER — Ambulatory Visit: Payer: Self-pay

## 2023-12-25 ENCOUNTER — Ambulatory Visit: Admitting: Family Medicine

## 2023-12-25 ENCOUNTER — Encounter: Payer: Self-pay | Admitting: Cardiology

## 2023-12-25 VITALS — BP 130/74 | HR 83 | Temp 98.2°F | Wt 193.0 lb

## 2023-12-25 DIAGNOSIS — R062 Wheezing: Secondary | ICD-10-CM

## 2023-12-25 DIAGNOSIS — R002 Palpitations: Secondary | ICD-10-CM | POA: Insufficient documentation

## 2023-12-25 MED ORDER — METHYLPREDNISOLONE ACETATE 80 MG/ML IJ SUSP
80.0000 mg | Freq: Once | INTRAMUSCULAR | Status: AC
  Administered 2023-12-25: 80 mg via INTRAMUSCULAR

## 2023-12-25 NOTE — Telephone Encounter (Signed)
 What does of the hydrochlorothiazide  for the patient every other day ?

## 2023-12-25 NOTE — Assessment & Plan Note (Signed)
 Not exactly sure what his chest pain and dyspnea is related to, not likely to be microvascular disease based on cardiac cath and stress test results. - No benefit with Ranexa , weaned off now along with Imdur .  Will continue amlodipine  5 mg daily. -Convert beta-blocker from Toprol  to bisoprolol  for beta-1 selectivity. - Monitor effectiveness of Opsumit 10 mg

## 2023-12-25 NOTE — Assessment & Plan Note (Signed)
 Intermittent palpitations, more pronounced when supine.  - Would like to continue beta-blocker, we will switch from Toprol -XL to bisoprolol  2.5 mg daily for more beta-1 selectivity.. - Monitor for improvement in palpitations.

## 2023-12-25 NOTE — Assessment & Plan Note (Signed)
 No longer on Plavix  because of significant bruising or bleeding.  On aspirin  monotherapy with 81 mg. -Okay to hold aspirin  5 to 7 days preop for surgeries or procedures.

## 2023-12-25 NOTE — Assessment & Plan Note (Signed)
 On Praluent  with well-controlled LDL 52.

## 2023-12-25 NOTE — Progress Notes (Signed)
 Established Patient Office Visit  Subjective   Patient ID: Luis Guzman, male    DOB: 10-06-1945  Age: 78 y.o. MRN: [id number]  Chief Complaint  Patient presents with   Cough   Wheezing    HPI   Mr. Moseng is seen with some persistent cough and wheezing.  Refer to last note.  Was seen last week and started on doxycycline  100 mg twice daily and prednisone .  Also using albuterol  inhaler.  Does not feel any worse but not much better.  Has 1 more day of antibiotic left.  No fever.  Productive cough had been colored sputum now clear.  Still has some wheezing intermittently.  Has just finished the prednisone .  No chest pains.  No chills.  He has chronic dyspnea at baseline which is unchanged Chest x-ray at urgent care prior to being seen here couple weeks ago that showed no infiltrate.  Past Medical History:  Diagnosis Date   Allergy    Anemia    as infant history of   CAD S/P PTCA & DES PCI - for Progressive Angina 02/26/2019   Cath-PCI 02/26/2019: EF 55-65%. mCx 65% (FFR 0.82 - Med Rx). D2 85% - Wolverine Scoring Balloon PTCA (2.0 mm). Ost rPDA - 90% -Resolute Onyx 2.5 x 15 (2.6 mm). 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 lower back pain, s/p cervical neck surgery"stiffness" remains   Transfusion history    infant "anemia"   Past Surgical History:  Procedure Laterality Date   APPENDECTOMY  ~ 1959   BACK SURGERY     CARDIAC CATHETERIZATION     CERVICAL DISC ARTHROPLASTY N/A 07/30/2018   Procedure: Cervical five-six Cervical six-seven  Artificial disc replacement;  Surgeon: Elna Haggis, MD;  Location: [facility] OR;  Service: Neurosurgery;  Laterality: N/A;   CERVICAL LAMINECTOMY  1997   C 4 and C 5   CHOLECYSTECTOMY N/A 04/15/2014   Procedure: LAPAROSCOPIC CHOLECYSTECTOMY WITH INTRAOPERATIVE CHOLANGIOGRAM;  Surgeon: Oralee Billow, MD;  Location: [facility] ORS;  Service: General;  Laterality: N/A;   COLONOSCOPY     CORONARY BALLOON ANGIOPLASTY N/A 02/26/2019   Procedure: CORONARY BALLOON ANGIOPLASTY;  Surgeon: Arleen Lacer, MD;  Location: [facility] INVASIVE CV LAB;  Service: Cardiovascular -> scoring balloon PTCA (Wolverine 2.0 mm) of ostial D2 85% reducing to 20-30%.   CORONARY PRESSURE/FFR STUDY N/A 02/26/2019   Procedure: INTRAVASCULAR PRESSURE WIRE/FFR STUDY;  Surgeon: Arleen Lacer, MD;  Location: [facility] INVASIVE CV LAB;  Service: Cardiovascular;; mCx ~65% - DFR 0.92, FR 0.82 - BORDERLINE--> MED Rx   CORONARY PRESSURE/FFR STUDY N/A 11/10/2021   Procedure: INTRAVASCULAR PRESSURE WIRE/FFR STUDY;  Surgeon: Arleen Lacer, MD;  Location: [facility] INVASIVE CV LAB;  Service: Cardiovascular;  Laterality: N/A;   CORONARY STENT INTERVENTION N/A 02/26/2019   Procedure: CORONARY STENT INTERVENTION;  Surgeon: Arleen Lacer, MD;  Location: [facility] INVASIVE CV LAB;; PCI ostial RPDA (initial attempt of PTCA only led to small local tear/dissection covered with  stent) Resolute Onyx 2.5 mm x 15 mm (2.6 mm   CORONARY STENT INTERVENTION N/A 07/05/2019   Procedure: CORONARY STENT INTERVENTION;  Surgeon: Arleen Lacer, MD;  Location: [facility] INVASIVE CV LAB;  Service: Cardiovascular;; ostial D1 95% (restenosis of PTCA site) -> DES PCI RESOLUTE ONYX 2.25 mm x 50 mm (2.6 mm) positioned to avoid the true ostium.    ESOPHAGOGASTRODUODENOSCOPY N/A 03/13/2014   Procedure: ESOPHAGOGASTRODUODENOSCOPY (EGD);  Surgeon: Celedonio Coil, MD;  Location: [facility] ENDOSCOPY;  Service: Endoscopy;  Laterality: N/A;   EYE SURGERY     FRACTURE SURGERY     JOINT REPLACEMENT     LEFT HEART CATH AND  CORONARY ANGIOGRAPHY N/A 02/26/2019   Procedure: LEFT HEART CATH AND CORONARY ANGIOGRAPHY;  Surgeon: Arleen Lacer, MD;  Location: [facility] INVASIVE CV LAB;  EF 55-65%. mCx 65% (FFR 0.82 - Med Rx). D2 85% - Wolverine Scoring Balloon PTCA (2.0 mm). Ost rPDA - 90% -Resolute Onyx 2.5 x 15 (2.6 mm).    LEFT HEART CATH AND CORONARY ANGIOGRAPHY N/A 07/05/2019   Procedure: LEFT HEART CATH AND CORONARY ANGIOGRAPHY;  Surgeon: Arleen Lacer, MD;  Location: [facility] INVASIVE CV LAB;; ostial D1 95% (restenosis of PTCA site) -> DES PCI.  PDA stent widely patent.  Mid CX stable 65% lesion.  Proximal RCA 40%.  Mid RCA 45%.  Normal EDP.   LEFT HEART CATH AND CORONARY ANGIOGRAPHY N/A 11/10/2021   Procedure: LEFT HEART CATH AND CORONARY ANGIOGRAPHY;  Surgeon: Arleen Lacer, MD;  Location: [facility] INVASIVE CV LAB;  Service: CV:: : Stable mid LCx with 5% (RFR 0.95).  Stable proximal RCA 40% and mid RCA 55%.  D1 and RPDA stents widely patent (   NM MYOVIEW  LTD  10/20/2021   Horizontal ST depressions in V4 and V5, frequent exercise-induced PVCs.  Inferior patient defect improves with stress-consistent with artifact.  Positive EKG, negative perfusion imaging.  Read as moderate risk study because of exertional ectopy.  Normal EF.   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   RIGHT HEART CATH N/A 01/26/2023   Procedure: RIGHT HEART CATH;  Surgeon: Mardell Shade, MD;  Location: [facility] INVASIVE CV LAB;  Service: Cardiovascular;  Laterality: N/A;   SHOULDER ARTHROSCOPY Bilateral    SHOULDER ARTHROSCOPY W/ ROTATOR CUFF REPAIR Bilateral 2004-2009   left 2004, right 2009   SPINE SURGERY     TOTAL HIP ARTHROPLASTY Left 03/04/2020   Procedure: TOTAL HIP ARTHROPLASTY ANTERIOR APPROACH;  Surgeon: Liliane Rei, MD;  Location: [facility] ORS;  Service: Orthopedics;  Laterality: Left;    TOTAL HIP ARTHROPLASTY Right 09/30/2020   Procedure: TOTAL HIP ARTHROPLASTY ANTERIOR APPROACH;  Surgeon: Liliane Rei, MD;  Location: [facility]  ORS;  Service: Orthopedics;  Laterality: Right;    TRANSTHORACIC ECHOCARDIOGRAM  02/2020; 10/01/2020   a) EF 55 to 60%.  GR 1 DD.  No R WMA.  Trivial MR.  Otherwise normal.; b) EF 55 to 60%.  Normal LV function and no wall motion abnormality.  Normal RV.  Normal valves..   TRANSTHORACIC ECHOCARDIOGRAM  10/18/2021   Normal LV function with a EF of 65%.  No RWMA.  GR 1 DD.  Normal valves.  Mild concentric LVH.  Mild MR and mild-moderate AI.   VASECTOMY     WISDOM TOOTH EXTRACTION      reports that he quit smoking about 55 years ago. His smoking use included cigarettes. He started smoking about 60 years ago. He has a 5 pack-year  smoking history. He has never used smokeless tobacco. He reports that he does not currently use alcohol. He reports that he does not use drugs. family history includes Arthritis in his sister; Heart disease in his mother; Hypertension in his father and mother; Stroke (age of onset: 27) in his sister.   Review of Systems  Constitutional:  Negative for chills and fever.  HENT:  Negative for sinus pain.   Respiratory:  Positive for cough, shortness of breath and wheezing. Negative for hemoptysis.   Cardiovascular:  Negative for chest pain.      Objective:     BP 130/74 (BP Location: Left Arm, Patient Position: Sitting, Cuff Size: Normal)   Pulse 83   Temp 98.2 F (36.8 C) (Oral)   Wt 193 lb (87.5 kg)   SpO2 95%   BMI 25.46 kg/m  BP Readings from Last 3 Encounters:  12/25/23 130/74  12/21/23 122/64  12/19/23 122/78   Wt Readings from Last 3 Encounters:  12/25/23 193 lb (87.5 kg)  12/21/23 189 lb 6.4 oz (85.9 kg)  12/19/23 193 lb 9.6 oz (87.8 kg)      Physical Exam Vitals reviewed.  Constitutional:      General: He is not in acute distress.    Appearance: He is not ill-appearing.  Cardiovascular:     Rate and Rhythm: Normal rate.  Pulmonary:     Comments: He has a few faint wheezes lower lung fields bilaterally but no rales.  Pulse oximetry  95% room air.  This was up from 91% last week.  Much less wheezing than noted last week.  No retractions. Neurological:     Mental Status: He is alert.      No results found for any visits on 12/25/23.    The 10-year ASCVD risk score (Arnett DK, et al., 2019) is: 29.4%    Assessment & Plan:   Problem List Items Addressed This Visit   None Visit Diagnoses       Wheezing    -  Primary   Relevant Medications   methylPREDNISolone  acetate (DEPO-MEDROL ) injection 80 mg (Completed)     Cough and wheezing.  Suspect viral trigger.  Lung exam is actually improved from last week though he feels like he still has some significant wheezing specially at night.  Finish out doxycycline .  Depo-Medrol  80 mg IM given.  Continue albuterol  MDI as needed.  Follow-up immediately for any fever or worsening symptoms.   Previously discussed possible pulse oximetry to monitor O2 sats  No follow-ups on file.    Glean Lamy, MD

## 2023-12-25 NOTE — Assessment & Plan Note (Signed)
 Intolerant of multiple different statins.  Now on Praluent .

## 2023-12-25 NOTE — Patient Instructions (Signed)
Follow up for any fever or increasing shortness of breath. 

## 2023-12-25 NOTE — Assessment & Plan Note (Addendum)
 Refer to exertional dyspnea section

## 2023-12-25 NOTE — Assessment & Plan Note (Signed)
.  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 Opsumit chosen for its benefits on pulmonary pressures and microvascular effects. Anticipated improved exercise tolerance and reduced pulmonary pressures. - Initiate Opsumit upon arrival of prescription (working through the issues with the PA). - Continue sildenafil  at reduced dose of 20 mg. - Monitor response to Opsumit over 3-4 weeks.

## 2023-12-25 NOTE — Assessment & Plan Note (Signed)
 Chronic exertional dyspnea without oxygen desaturation. Suspected exercise-induced bronchospasm. Albuterol  inhaler suggested to assess for bronchospasm contribution. - Prescribe albuterol  inhaler for use before physical activity. - Evaluate response to albuterol  inhaler. - Switch from Toprol  to bisoprolol  for beta-1 selectivity.

## 2023-12-25 NOTE — Assessment & Plan Note (Signed)
 Blood pressure well-controlled with Norvasc  and Toprol , will continue Norvasc  5 earlier daily and switch Toprol  to bisoprolol  for more beta-1 selectivity. -Stop Toprol , start bisoprolol  2.5 mg daily

## 2023-12-25 NOTE — Telephone Encounter (Signed)
 Copied from CRM [id number]. Topic: Clinical - Red Word Triage >> Dec 25, 2023 10:41 AM Juluis Ok wrote: [facility] that prompted transfer to Nurse Triage: productive cough, wheezing   Chief Complaint: Infection on Antibiotic Follow Up Symptoms: Cough, Wheezing  Frequency: Ongoing  Pertinent Negatives: Patient denies worsening  Disposition: [] ED /[] Urgent Care (no appt availability in office) / [x] Appointment(In office/virtual)/ []  [place] [facility]/ [] Home Care/ [] Refused Recommended Disposition /[] [facility]/ []  Follow-up with PCP  Additional Notes: JL saw Dr. Darren Em last week for a productive cough and wheezing. The patient is on his last day of antibiotics and has ran out of his cough syrup. Contacted the CAL and spoike with Norma. The patient has an in office appointment for today.  Reason for Disposition  [1] Taking antibiotic > 72 hours (3 days) AND [2] symptoms (other than fever) not improved  Answer Assessment - Initial Assessment Questions 1. INFECTION: "What infection is the antibiotic being given for?"     Unsure  2. ANTIBIOTIC: "What antibiotic are you taking" "How many times per day?"     Doxycycline  BID  3. DURATION: "When was the antibiotic started?"     Last Tuesday  4. MAIN CONCERN OR SYMPTOM:  "What is your main concern right now?"     Productive cough and wheezing  5. BETTER-SAME-WORSE: "Are you getting better, staying the same, or getting worse compared to when you first started the antibiotics?" If getting worse, ask: "In what way?"      Staying the same  6. FEVER: "Do you have a fever?" If Yes, ask: "What is your temperature, how was it measured, and when did it start?"     No  7. SYMPTOMS: "Are there any other symptoms you're concerned about?" If Yes, ask: "When did it start?"     No  8. FOLLOW-UP APPOINTMENT: "Do you have a follow-up appointment with your doctor?"     No  Protocols used: Infection on Antibiotic Follow-up  Call-A-AH

## 2023-12-25 NOTE — Assessment & Plan Note (Addendum)
 Multiple studies performed after PCI have shown no evidence of ischemia although the [facility] stress echo suggested in the report that there may have been ischemia but the remainder of the body the report did not suggest. Treating for potential microvascular disease with Ranexa  did not seem to help.  Now weaned off of Ranexa . -Continue aspirin  81 mg daily. - Statin intolerant, continue Praluent  - No longer taking Toprol , with concerns for fatigue, we will try low-dose bisoprolol  2.5 mg daily - Continue amlodipine  5 mg daily for antianginal benefit with microvascular concern.

## 2023-12-26 MED ORDER — BISOPROLOL FUMARATE 5 MG PO TABS: 2.5000 mg | ORAL_TABLET | Freq: Every day | ORAL | 3 refills | Status: DC

## 2023-12-26 NOTE — Addendum Note (Signed)
 Addended by: Bebe Bourdon on: 12/26/2023 10:34 AM   Modules accepted: Orders

## 2023-12-26 NOTE — Telephone Encounter (Signed)
 RN  called patient  informed patient since the [place] does have bisoprolol -hydrochlorothiazide  on formula.  Dr Addie Holstein decide to prescribe Bisoprolol  2.5 mg  ( 1/2 tablet of 5 mg.   Patient is aware of the change and the medication was e-sent to [facility]

## 2023-12-26 NOTE — Telephone Encounter (Signed)
 Dr Addie Holstein answer question in another encounter  Lets just do bisoprolol  without the bisoprolol  HCTZ.  The prescription will be for 5 mg tablet, take 1/2 tablet daily.   Bisprolol 5 mg tab - take 1/2 tab daily; continue; Disp #45 tab; 3 refills   DH    Patient is aware.

## 2023-12-26 NOTE — Telephone Encounter (Signed)
 I think he was on 12.5 mg

## 2023-12-27 ENCOUNTER — Other Ambulatory Visit: Payer: Self-pay

## 2023-12-27 NOTE — Patient Instructions (Signed)
Thank you for allowing the Chronic Care Management team to participate in your care.  

## 2023-12-27 NOTE — Patient Outreach (Unsigned)
 Complex Care Management   Visit Note  12/27/2023  Name:  Luis Guzman MRN: [id number] DOB: [birth year]  Situation: Referral received for Complex Care Management related to {Criteria:32550} I obtained verbal consent from {CHL AMB Patient/Caregiver:28184}.  Visit completed with ***  {VISIT LOCATION:32553}  Background:   Past Medical History:  Diagnosis Date   Allergy    Anemia    as infant history of   CAD S/P PTCA & DES PCI - for Progressive Angina 02/26/2019   Cath-PCI 02/26/2019: EF 55-65%. mCx 65% (FFR 0.82 - Med Rx). D2 85% - Wolverine Scoring Balloon PTCA (2.0 mm). Ost rPDA - 90% -Resolute Onyx 2.5 x 15 (2.6 mm). 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 lower back pain, s/p cervical neck surgery"stiffness" remains   Transfusion history    infant "anemia"    Assessment: Patient Reported Symptoms:  Cognitive        Neurological      HEENT        Cardiovascular      Respiratory      Endocrine      Gastrointestinal        Genitourinary      Integumentary      Musculoskeletal          Psychosocial              12/21/2023    9:05 AM  Depression screen PHQ 2/9  Decreased Interest 0  Down, Depressed, Hopeless 0  PHQ - 2 Score 0    There were no vitals filed for this visit.  Medications Reviewed Today     Reviewed by Roxie Cord, RN (Registered Nurse) on 12/27/23 at 2240  Med List Status: <None>   Medication Order Taking? Sig Documenting Provider Last Dose Status Informant  acetaminophen  (TYLENOL ) 500 MG  tablet [id number] No Take 1,000 mg by mouth every 6 (six) hours as needed for moderate pain. [provider] Taking Active Self  albuterol  (VENTOLIN  HFA) 108 (90 Base) MCG/ACT inhaler [id number] No Inhale 2 puffs into the lungs every 6 (six) hours as needed for wheezing or shortness of breath. Marquetta Sit, MD Taking Active   Alirocumab  (PRALUENT ) 150 MG/ML SOAJ [id number] No Inject 1 mL (150 mg total) into the skin every 14 (fourteen) days. Arleen Lacer, MD Taking Active   amLODipine  (NORVASC ) 5 MG tablet [id number] No Take 1 tablet (5 mg total) by mouth daily. Arleen Lacer, MD Taking Active   aspirin  EC 81 MG tablet [id number] No Take 1 tablet (81 mg total) by mouth daily. Swallow whole. ( start after stopping clopidogrel  75 mg) Arleen Lacer, MD Taking Active Self  bimatoprost (LUMIGAN) 0.03 % ophthalmic solution [id number] No Place 1 drop into both eyes at bedtime. [provider] Taking Active Self  bisoprolol  (ZEBETA ) 5 MG tablet [id number]  Take 0.5 tablets (2.5 mg total) by mouth daily. Arleen Lacer, MD  Active   Brinzolamide-Brimonidine [facility]) 1-0.2 %  SUSP [id number] No Place 1 drop into both eyes in the morning and at bedtime. [provider] Taking Active Self  doxycycline  (VIBRAMYCIN ) 100 MG capsule [id number] No Take 1 capsule (100 mg total) by mouth 2 (two) times daily. Marquetta Sit, MD Taking Active   furosemide  (LASIX ) 20 MG tablet [id number] No TAKE 3 TABLETS BY MOUTH EVERY DAY Bensimhon, Rheta Celestine, MD Taking Active   HYDROcodone  bit-homatropine (HYCODAN) 5-1.5 MG/5ML syrup [id number] No Take 5 mLs by mouth every 6 (six) hours as needed. Marquetta Sit, MD Taking Active   ibuprofen (ADVIL) 200 MG tablet [id number] No Take 400 mg by mouth every 6 (six) hours as needed for moderate pain. [provider] Taking Active Self  irbesartan  (AVAPRO ) 150 MG tablet [id number] No Take 1 tablet (150 mg total) by mouth daily. Arleen Lacer,  MD Taking Active Self  macitentan (OPSUMIT) 10 MG tablet [id number] No Take 10 mg by mouth daily. [provider] Taking Active   potassium chloride  (KLOR-CON  M) 10 MEQ tablet [id number] No Take 40 mEq by mouth daily. [provider] Taking Active   predniSONE  (DELTASONE ) 20 MG tablet [id number] No Take two tablets by mouth once daily for 5 days Marquetta Sit, MD Taking Active   sertraline  (ZOLOFT ) 50 MG tablet [id number] No Take 25 mg by mouth daily. [provider] Taking Active   sildenafil  (REVATIO ) 20 MG tablet [id number] No Take 2 tablets (40 mg total) by mouth 3 (three) times daily. Bensimhon, Rheta Celestine, MD Taking Active   traZODone  (DESYREL ) 50 MG tablet [id number] No Take one to two tablets by mouth at night as needed for insomnia. Marquetta Sit, MD Taking Active            Med Note [facility], Chett Taniguchi N   Wed Sep 27, 2023 11:15 AM) Currently taking two as needed            Recommendation:   {RECOMMENDATONS:32554}  Follow Up Plan:   {FOLLOWUP:32559}  SIG ***

## 2024-01-18 ENCOUNTER — Other Ambulatory Visit: Payer: Self-pay | Admitting: Family Medicine

## 2024-02-08 ENCOUNTER — Encounter: Payer: Self-pay | Admitting: Cardiology

## 2024-02-08 NOTE — Telephone Encounter (Signed)
 I do not really know.  But all I can say is try to see how you do off of the bisoprolol .  Alm Clay, MD

## 2024-02-16 ENCOUNTER — Other Ambulatory Visit: Payer: Self-pay | Admitting: Cardiology

## 2024-02-16 DIAGNOSIS — I10 Essential (primary) hypertension: Secondary | ICD-10-CM

## 2024-02-19 ENCOUNTER — Telehealth: Payer: Self-pay | Admitting: *Deleted

## 2024-02-19 ENCOUNTER — Ambulatory Visit: Attending: Cardiology | Admitting: Cardiology

## 2024-02-19 ENCOUNTER — Ambulatory Visit: Admitting: Cardiology

## 2024-02-19 VITALS — BP 110/62 | HR 76 | Ht 73.0 in | Wt 188.0 lb

## 2024-02-19 DIAGNOSIS — I10 Essential (primary) hypertension: Secondary | ICD-10-CM | POA: Diagnosis not present

## 2024-02-19 DIAGNOSIS — T466X5A Adverse effect of antihyperlipidemic and antiarteriosclerotic drugs, initial encounter: Secondary | ICD-10-CM

## 2024-02-19 DIAGNOSIS — I272 Pulmonary hypertension, unspecified: Secondary | ICD-10-CM

## 2024-02-19 DIAGNOSIS — E785 Hyperlipidemia, unspecified: Secondary | ICD-10-CM

## 2024-02-19 DIAGNOSIS — I25119 Atherosclerotic heart disease of native coronary artery with unspecified angina pectoris: Secondary | ICD-10-CM

## 2024-02-19 DIAGNOSIS — R002 Palpitations: Secondary | ICD-10-CM

## 2024-02-19 DIAGNOSIS — R0609 Other forms of dyspnea: Secondary | ICD-10-CM

## 2024-02-19 DIAGNOSIS — M791 Myalgia, unspecified site: Secondary | ICD-10-CM

## 2024-02-19 MED ORDER — SILDENAFIL CITRATE 20 MG PO TABS: 20.0000 mg | ORAL_TABLET | Freq: Three times a day (TID) | ORAL | Status: AC

## 2024-02-19 MED ORDER — BISOPROLOL FUMARATE 5 MG PO TABS: 2.5000 mg | ORAL_TABLET | Freq: Every day | ORAL | Status: AC

## 2024-02-19 NOTE — Progress Notes (Unsigned)
  Cardiology Office Note:  .   Date:  02/19/2024  ID:  Luis Guzman, DOB January 13, 1946, MRN [id number] PCP: Micheal Wolm LELON, MD  [facility] Providers Cardiologist:  Alm Clay, MD { Click to update primary MD,subspecialty MD or APP then REFRESH:1}    No chief complaint on file.   Patient Profile: .     Luis Guzman is a *** 78 y.o. male *** with a PMH notable for *** who presents here for *** at the request of Burchette, Wolm LELON, MD.  Luis Guzman is a  78 y.o. male  with a PMH notable for 2 V CAD-PCI, HLD & Ongoing CP & Dyspnea both @ Rest & with Exertions who presents here for follow-up after outside referral to [facility] for third opinion  He was originally referred for anginal symptoms at the request of Micheal Wolm LELON, MD.         [name] was last seen on ***  Subjective  Discussed the use of AI scribe software for clinical note transcription with the patient, who gave verbal consent to proceed.  History of Present Illness      Cardiovascular ROS: {roscv:310661}  ROS:  Review of Systems - {ros master:310782}    Objective    Studies Reviewed: SABRA        ECHO: *** CATH: *** MONITOR: *** CT: ***  Risk Assessment/Calculations:   {Does this patient have ATRIAL FIBRILLATION?:[phone]}           Physical Exam:   VS:  BP 110/62   Pulse 76   Ht 6' 1 (1.854 m)   Wt 188 lb (85.3 kg)   SpO2 96%   BMI 24.80 kg/m    Wt Readings from Last 3 Encounters:  02/19/24 188 lb (85.3 kg)  12/27/23 187 lb (84.8 kg)  12/25/23 193 lb (87.5 kg)    GEN: Well nourished, well developed in no acute distress; *** NECK: No JVD; No carotid bruits CARDIAC: Normal S1, S2; RRR, no murmurs, rubs, gallops RESPIRATORY:  Clear to auscultation without rales, wheezing or rhonchi ; nonlabored, good air movement. ABDOMEN: Soft, non-tender, non-distended EXTREMITIES:  No edema; No deformity      ASSESSMENT AND PLAN: .    Problem List Items Addressed This Visit    None   Assessment and Plan Assessment & Plan        {Are you ordering a CV Procedure (e.g. stress test, cath, DCCV, TEE, etc)?   Press F2        :[id number]}   Follow-Up: No follow-ups on file.  Total time spent: *** min spent with patient + *** min spent charting = *** min    Signed, Alm MICAEL Clay, MD, MS Alm Clay, M.D., M.S. Interventional Chartered certified accountant  Pager # [phone]

## 2024-02-19 NOTE — Telephone Encounter (Signed)
  Patient Consent for Virtual Visit        Luis Guzman has provided verbal consent on 02/19/2024 for a virtual visit (video or telephone).   CONSENT FOR VIRTUAL VISIT FOR:  Luis Guzman  By participating in this virtual visit I agree to the following:  I hereby voluntarily request, consent and authorize [facility] and its employed or contracted physicians, physician assistants, nurse practitioners or other licensed health care professionals (the Practitioner), to provide me with telemedicine health care services (the "Services) as deemed necessary by the treating Practitioner. I acknowledge and consent to receive the Services by the Practitioner via telemedicine. I understand that the telemedicine visit will involve communicating with the Practitioner through live audiovisual communication technology and the disclosure of certain medical information by electronic transmission. I acknowledge that I have been given the opportunity to request an in-person assessment or other available alternative prior to the telemedicine visit and am voluntarily participating in the telemedicine visit.  I understand that I have the right to withhold or withdraw my consent to the use of telemedicine in the course of my care at any time, without affecting my right to future care or treatment, and that the Practitioner or I may terminate the telemedicine visit at any time. I understand that I have the right to inspect all information obtained and/or recorded in the course of the telemedicine visit and may receive copies of available information for a reasonable fee.  I understand that some of the potential risks of receiving the Services via telemedicine include:  Delay or interruption in medical evaluation due to technological equipment failure or disruption; Information transmitted may not be sufficient (e.g. poor resolution of images) to allow for appropriate medical decision making by the Practitioner; and/or   In rare instances, security protocols could fail, causing a breach of personal health information.  Furthermore, I acknowledge that it is my responsibility to provide information about my medical history, conditions and care that is complete and accurate to the best of my ability. I acknowledge that Practitioner's advice, recommendations, and/or decision may be based on factors not within their control, such as incomplete or inaccurate data provided by me or distortions of diagnostic images or specimens that may result from electronic transmissions. I understand that the practice of medicine is not an exact science and that Practitioner makes no warranties or guarantees regarding treatment outcomes. I acknowledge that a copy of this consent can be made available to me via my patient portal [facility] MyChart), or I can request a printed copy by calling the office of [facility].    I understand that my insurance will be billed for this visit.   I have read or had this consent read to me. I understand the contents of this consent, which adequately explains the benefits and risks of the Services being provided via telemedicine.  I have been provided ample opportunity to ask questions regarding this consent and the Services and have had my questions answered to my satisfaction. I give my informed consent for the services to be provided through the use of telemedicine in my medical care

## 2024-02-19 NOTE — Patient Instructions (Addendum)
 Medication Instructions:    Restart taking Bisoprolol  2.5 mg  on 02/20/24.  Stop taking Amlodipine   5 mg .   Sliding Scale for increase weight of 3 lbs or more Sliding scale Lasix : Your dry weight will be 187 lbs.  If you gain more than 3 pounds from dry weight: Increase the Lasix  dosing to 60 mg in the morning and 40 mg in the afternoon until weight returns to baseline dry weight. If the weight goes down more than 3 pounds from dry weight: Hold Lasix  until it returns to baseline dry weight    *If you need a refill on your cardiac medications before your next appointment, please call your pharmacy*   Lab Work: Not needed    Testing/Procedures: Not needed   Follow-Up: At [facility], you and your health needs are our priority.  As part of our continuing mission to provide you with exceptional heart care, we have created designated Provider Care Teams.  These Care Teams include your primary Cardiologist (physician) and Advanced Practice Providers (APPs -  Physician Assistants and Nurse Practitioners) who all work together to provide you with the care you need, when you need it.     Your next appointment:   3 month(s)  The format for your next appointment:   In Person  Provider:   Alm Clay, MD

## 2024-02-19 NOTE — Telephone Encounter (Signed)
 RN spoke to patient. Instruction were given  from today's virtual visit 02/19/24 .  AVS Summary has been sent by mychart .   Follow up appointment schedule for 05/22/24.   Patient verbalized understanding

## 2024-02-22 ENCOUNTER — Encounter: Payer: Self-pay | Admitting: Cardiology

## 2024-02-22 NOTE — Assessment & Plan Note (Signed)
 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 Now having issues of weight gain and edema Weight stable at 188-189 lbs with peripheral edema. Amlodipine  likely contributing to swelling. Current weight goal is 187 lbs. - Continue Lasix  60 mg daily. - Administer additional 40 mg Lasix  if weight increases >3 lbs/day. - Target weight reduction to 185 lbs, maintain at 187 lbs for now.

## 2024-02-22 NOTE — Progress Notes (Deleted)
 PCP:  Micheal Wolm ORN, MD  Cardiologist:  Alm Clay, MD *** Electrophysiologist:  None   Chief Complaint:   No chief complaint on file.   ====================================  ASSESSMENT & PLAN:    Problem List Items Addressed This Visit   None   ====================================  History of Present Illness:    Luis Guzman is a 78 y.o. male with PMH notable for *** who presents via audio/video conferencing for a telehealth visit today as a ***.  Luis Guzman was last seen ***  Hospitalizations:  ***   Recent - Interim CV studies:   The following studies were reviewed today: ***:  Inerval History   ***  Cardiovascular ROS: {roscv:310661}   ROS:  Please see the history of present illness.     ROS  Past Medical History:  Diagnosis Date   Allergy    Anemia    as infant history of   CAD S/P PTCA & DES PCI - for Progressive Angina 02/26/2019   Cath-PCI 02/26/2019: EF 55-65%. mCx 65% (FFR 0.82 - Med Rx). D2 85% - Wolverine Scoring Balloon PTCA (2.0 mm). Ost rPDA - 90% -Resolute Onyx 2.5 x 15 (2.6 mm). 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 lower back pain, s/p cervical neck surgerystiffness remains   Transfusion history    infant anemia   Past Surgical History:  Procedure Laterality Date   APPENDECTOMY  ~ 1959   BACK SURGERY     CARDIAC CATHETERIZATION     CERVICAL DISC ARTHROPLASTY N/A 07/30/2018   Procedure: Cervical five-six Cervical six-seven Artificial disc replacement;  Surgeon: Colon Shove,  MD;  [facility];  Service: Neurosurgery;  Laterality: N/A;   CERVICAL LAMINECTOMY  1997   C 4 and C 5   CHOLECYSTECTOMY N/A 04/15/2014   Procedure: LAPAROSCOPIC CHOLECYSTECTOMY WITH INTRAOPERATIVE CHOLANGIOGRAM;  Surgeon: Krystal Spinner, MD;  Location: [facility];  Service: General;  Laterality: N/A;   COLONOSCOPY     CORONARY BALLOON ANGIOPLASTY N/A 02/26/2019   Procedure: CORONARY BALLOON ANGIOPLASTY;  Surgeon: Clay Alm ORN, MD;  Location: [facility] INVASIVE CV LAB;  Service: Cardiovascular -> scoring balloon PTCA (Wolverine 2.0 mm) of ostial D2 85% reducing to 20-30%.   CORONARY PRESSURE/FFR STUDY N/A 02/26/2019   Procedure: INTRAVASCULAR PRESSURE WIRE/FFR STUDY;  Surgeon: Clay Alm ORN, MD;  Location: [facility] INVASIVE CV LAB;  Service: Cardiovascular;; mCx ~65% - DFR 0.92, FR 0.82 - BORDERLINE--> MED Rx   CORONARY PRESSURE/FFR STUDY N/A 11/10/2021   Procedure: INTRAVASCULAR PRESSURE WIRE/FFR STUDY;  Surgeon: Clay Alm ORN, MD;  Location: [facility] INVASIVE CV LAB;  Service: Cardiovascular;  Laterality: N/A;   CORONARY STENT INTERVENTION N/A 02/26/2019   Procedure: CORONARY STENT INTERVENTION;  Surgeon: Clay Alm ORN, MD;  [facility] INVASIVE CV LAB;; PCI ostial RPDA (initial attempt of PTCA only led to small local tear/dissection covered with stent) Resolute Onyx 2.5 mm x 15 mm (2.6 mm   CORONARY STENT INTERVENTION N/A 07/05/2019  Procedure: CORONARY STENT INTERVENTION;  Surgeon: Anner Alm ORN, MD;  Location: [facility] INVASIVE CV LAB;  Service: Cardiovascular;; ostial D1 95% (restenosis of PTCA site) -> DES PCI RESOLUTE ONYX 2.25 mm x 50 mm (2.6 mm) positioned to avoid the true ostium.    ESOPHAGOGASTRODUODENOSCOPY N/A 03/13/2014   Procedure: ESOPHAGOGASTRODUODENOSCOPY (EGD);  Surgeon: Lesta JULIANNA Fitz, MD;  Location: [facility] ENDOSCOPY;  Service: Endoscopy;  Laterality: N/A;   EYE SURGERY     FRACTURE SURGERY     JOINT REPLACEMENT     LEFT HEART CATH AND CORONARY ANGIOGRAPHY N/A 02/26/2019   Procedure:  LEFT HEART CATH AND CORONARY ANGIOGRAPHY;  Surgeon: Anner Alm ORN, MD;  Location: [facility] INVASIVE CV LAB;  EF 55-65%. mCx 65% (FFR 0.82 - Med Rx). D2 85% - Wolverine Scoring Balloon PTCA (2.0 mm). Ost rPDA - 90% -Resolute Onyx 2.5 x 15 (2.6 mm).    LEFT HEART CATH AND CORONARY ANGIOGRAPHY N/A 07/05/2019   Procedure: LEFT HEART CATH AND CORONARY ANGIOGRAPHY;  Surgeon: Anner Alm ORN, MD;  Location: [facility] INVASIVE CV LAB;; ostial D1 95% (restenosis of PTCA site) -> DES PCI.  PDA stent widely patent.  Mid CX stable 65% lesion.  Proximal RCA 40%.  Mid RCA 45%.  Normal EDP.   LEFT HEART CATH AND CORONARY ANGIOGRAPHY N/A 11/10/2021   Procedure: LEFT HEART CATH AND CORONARY ANGIOGRAPHY;  Surgeon: Anner Alm ORN, MD;  Location: [facility] INVASIVE CV LAB;  Service: CV:: : Stable mid LCx with 5% (RFR 0.95).  Stable proximal RCA 40% and mid RCA 55%.  D1 and RPDA stents widely patent (   NM MYOVIEW  LTD  10/20/2021   Horizontal ST depressions in V4 and V5, frequent exercise-induced PVCs.  Inferior patient defect improves with stress-consistent with artifact.  Positive EKG, negative perfusion imaging.  Read as moderate risk study because of exertional ectopy.  Normal EF.   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   RIGHT HEART CATH N/A 01/26/2023   Procedure: RIGHT HEART CATH;  Surgeon: Cherrie Toribio SAUNDERS, MD;  Location: [facility] INVASIVE CV LAB;  Service: Cardiovascular;  Laterality: N/A;   SHOULDER ARTHROSCOPY Bilateral    SHOULDER ARTHROSCOPY W/ ROTATOR CUFF REPAIR Bilateral 2004-2009   left 2004, right 2009   SPINE SURGERY     TOTAL HIP ARTHROPLASTY Left 03/04/2020   Procedure: TOTAL HIP ARTHROPLASTY ANTERIOR APPROACH;  Surgeon: Melodi Lerner, MD;  Location: [facility];  Service: Orthopedics;  Laterality: Left;    TOTAL HIP ARTHROPLASTY Right 09/30/2020   Procedure: TOTAL HIP ARTHROPLASTY ANTERIOR APPROACH;  Surgeon: Melodi Lerner, MD;  Location: [facility];  Service: Orthopedics;  Laterality: Right;     TRANSTHORACIC ECHOCARDIOGRAM  02/2020; 10/01/2020   a) EF 55 to 60%.  GR 1 DD.  No R WMA.  Trivial MR.  Otherwise normal.; b) EF 55 to 60%.  Normal LV function and no wall motion abnormality.  Normal RV.  Normal valves..   TRANSTHORACIC ECHOCARDIOGRAM  10/18/2021   Normal LV function with a EF of 65%.  No RWMA.  GR 1 DD.  Normal valves.  Mild concentric LVH.  Mild MR and mild-moderate AI.   VASECTOMY     WISDOM TOOTH EXTRACTION       Current Meds  Medication Sig   acetaminophen  (TYLENOL ) 500 MG tablet Take 1,000 mg by mouth every 6 (six) hours as needed for moderate pain.   albuterol  (VENTOLIN  HFA) 108 (90 Base) MCG/ACT inhaler Inhale 2 puffs into the lungs every 6 (  six) hours as needed for wheezing or shortness of breath.   Alirocumab  (PRALUENT ) 150 MG/ML SOAJ Inject 1 mL (150 mg total) into the skin every 14 (fourteen) days.   aspirin  EC 81 MG tablet Take 1 tablet (81 mg total) by mouth daily. Swallow whole. ( start after stopping clopidogrel  75 mg)   bimatoprost (LUMIGAN) 0.03 % ophthalmic solution Place 1 drop into both eyes at bedtime.   furosemide  (LASIX ) 20 MG tablet Take 3 tablets (60 mg total) by mouth daily.   ibuprofen (ADVIL) 200 MG tablet Take 400 mg by mouth every 6 (six) hours as needed for moderate pain.   irbesartan  (AVAPRO ) 150 MG tablet Take 1 tablet (150 mg total) by mouth daily.   potassium chloride  (KLOR-CON  M) 10 MEQ tablet Take 20 mEq by mouth daily.   sertraline  (ZOLOFT ) 50 MG tablet TAKE 1 TABLET BY MOUTH EVERY DAY   traZODone  (DESYREL ) 50 MG tablet Take one to two tablets by mouth at night as needed for insomnia.   [DISCONTINUED] amLODipine  (NORVASC ) 5 MG tablet Take 1 tablet (5 mg total) by mouth daily.   [DISCONTINUED] sildenafil  (REVATIO ) 20 MG tablet Take 2 tablets (40 mg total) by mouth 3 (three) times daily.     Allergies:   Penicillins and Statins   Social History   Tobacco Use   Smoking status: Former    Current packs/day: 0.00    Average  packs/day: 1 pack/day for 5.0 years (5.0 ttl pk-yrs)    Types: Cigarettes    Start date: 10/18/1963    Quit date: 10/17/1968    Years since quitting: 55.3   Smokeless tobacco: Never  Vaping Use   Vaping status: Never Used  Substance Use Topics   Alcohol use: Not Currently    Comment: quit drinking ~ 10/1968   Drug use: No     Family Hx: The patient's family history includes Arthritis in his sister; Heart disease in his mother; Hypertension in his father and mother; Stroke (age of onset: 95) in his sister.   Labs/Other Tests and Data Reviewed:    EKG:  {ZXH:[id number]}  Recent Labs: 06/13/2023: ALT 21; Hemoglobin 15.1; Platelets 246.0 09/20/2023: B Natriuretic Peptide 24.6; BUN 14; Creatinine, Ser 1.11; Potassium 4.7; Sodium 137   Recent Lipid Panel Lab Results  Component Value Date/Time   CHOL 130 06/13/2023 08:42 AM   CHOL 127 11/14/2022 08:31 AM   TRIG 188.0 (H) 06/13/2023 08:42 AM   HDL 40.60 06/13/2023 08:42 AM   HDL 44 11/14/2022 08:31 AM   CHOLHDL 3 06/13/2023 08:42 AM   LDLCALC 52 06/13/2023 08:42 AM   LDLCALC 55 11/14/2022 08:31 AM   LDLCALC 98 05/06/2020 08:42 AM   LDLDIRECT 129.0 08/19/2016 08:38 AM    Wt Readings from Last 3 Encounters:  02/19/24 188 lb (85.3 kg)  12/27/23 187 lb (84.8 kg)  12/25/23 193 lb (87.5 kg)     Objective:    Vital Signs:  BP 110/62   Pulse 76   Ht 6' 1 (1.854 m)   Wt 188 lb (85.3 kg)   SpO2 96%   BMI 24.80 kg/m   {[facility] Virtual Exam (Optional):[id number]::VITAL SIGNS:  reviewed}   ==========================================  COVID-19 Education: The signs and symptoms of COVID-19 were discussed with the patient and how to seek care for testing (follow up with PCP or arrange E-visit).   The importance of social distancing was discussed today.  Time:   Today, I have spent *** minutes with the patient with telehealth technology  discussing the above problems.   An additional ***minutes spent charting (reviewing prior  notes, hospital records, studies, labs etc.) Total ***minutes   Medication Adjustments/Labs and Tests Ordered: Current medicines are reviewed at length with the patient today.  Concerns regarding medicines are outlined above.   Patient Instructions  Medication Instructions:    Restart taking Bisoprolol  2.5 mg  on 02/20/24.  Stop taking Amlodipine   5 mg .   Sliding Scale for increase weight of 3 lbs or more Sliding scale Lasix : Your dry weight will be 187 lbs.  If you gain more than 3 pounds from dry weight: Increase the Lasix  dosing to 60 mg in the morning and 40 mg in the afternoon until weight returns to baseline dry weight. If the weight goes down more than 3 pounds from dry weight: Hold Lasix  until it returns to baseline dry weight    *If you need a refill on your cardiac medications before your next appointment, please call your pharmacy*   Lab Work: Not needed    Testing/Procedures: Not needed   Follow-Up: At [facility], you and your health needs are our priority.  As part of our continuing mission to provide you with exceptional heart care, we have created designated Provider Care Teams.  These Care Teams include your primary Cardiologist (physician) and Advanced Practice Providers (APPs -  Physician Assistants and Nurse Practitioners) who all work together to provide you with the care you need, when you need it.     Your next appointment:   3 month(s)  The format for your next appointment:   In Person  Provider:   Alm Clay, MD     Signed, Alm Clay, MD  02/22/2024 8:00 PM    [facility]

## 2024-02-22 NOTE — Progress Notes (Deleted)
 {Choose 1 Note Type (Telehealth Visit or Telephone Visit):[phone]}   Patient has given verbal permission to conduct this visit via virtual appointment and to bill insurance 02/22/2024 8:02 PM     Evaluation Performed:  Follow-up visit  Date:  02/22/2024   ID:  Luis Guzman, Luis Guzman September 26, 1945, MRN [id number]  {Patient [phone]::Home} {Provider [phone]::Home Office}  PCP:  Micheal Wolm LELON, MD  Cardiologist:  Alm Clay, MD *** Electrophysiologist:  None   Chief Complaint:   No chief complaint on file.   ====================================  ASSESSMENT & PLAN:    Problem List Items Addressed This Visit   None   ====================================  History of Present Illness:    Luis Guzman is a 78 y.o. male with PMH notable for *** who presents via audio/video conferencing for a telehealth visit today as a ***.  Luis Guzman was last seen ***  Hospitalizations:  ***   Recent - Interim CV studies:   The following studies were reviewed today: ***:  Inerval History   ***  Cardiovascular ROS: {roscv:310661}   ROS:  Please see the history of present illness.     ROS  Past Medical History:  Diagnosis Date   Allergy    Anemia    as infant history of   CAD S/P PTCA & DES PCI - for Progressive Angina 02/26/2019   Cath-PCI 02/26/2019: EF 55-65%. mCx 65% (FFR 0.82 - Med Rx). D2 85% - Wolverine Scoring Balloon PTCA (2.0 mm). Ost rPDA - 90% -Resolute Onyx 2.5 x 15 (2.6 mm). 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 lower back pain, s/p cervical neck surgerystiffness remains   Transfusion history    infant anemia   Past Surgical History:  Procedure Laterality Date   APPENDECTOMY  ~ 1959   BACK SURGERY     CARDIAC CATHETERIZATION     CERVICAL DISC ARTHROPLASTY N/A 07/30/2018   Procedure: Cervical five-six Cervical six-seven Artificial disc replacement;  Surgeon: Colon Shove, MD;  Location: [facility];  Service: Neurosurgery;  Laterality: N/A;   CERVICAL LAMINECTOMY  1997   C 4 and C 5   CHOLECYSTECTOMY N/A 04/15/2014   Procedure: LAPAROSCOPIC CHOLECYSTECTOMY WITH INTRAOPERATIVE CHOLANGIOGRAM;  Surgeon: Krystal Spinner, MD;  Location: [facility];  Service: General;  Laterality: N/A;   COLONOSCOPY     CORONARY BALLOON ANGIOPLASTY N/A 02/26/2019   Procedure: CORONARY BALLOON ANGIOPLASTY;  Surgeon: Clay Alm LELON, MD;  Location: [facility] INVASIVE CV LAB;  Service: Cardiovascular -> scoring balloon PTCA (Wolverine 2.0 mm) of ostial D2 85% reducing to 20-30%.   CORONARY PRESSURE/FFR STUDY N/A 02/26/2019   Procedure: INTRAVASCULAR PRESSURE WIRE/FFR STUDY;  Surgeon: Clay Alm LELON, MD;  Location: [facility] INVASIVE CV LAB;  Service: Cardiovascular;; mCx ~65% - DFR 0.92, FR 0.82 - BORDERLINE--> MED Rx   CORONARY PRESSURE/FFR STUDY N/A 11/10/2021   Procedure: INTRAVASCULAR PRESSURE WIRE/FFR STUDY;  Surgeon: Clay Alm LELON, MD;  Location: [facility] INVASIVE CV LAB;  Service: Cardiovascular;  Laterality: N/A;   CORONARY STENT INTERVENTION N/A 02/26/2019   Procedure: CORONARY STENT INTERVENTION;  Surgeon: Anner Alm ORN, MD;  Location: [facility] INVASIVE CV LAB;; PCI ostial RPDA (initial attempt of PTCA only led to small local tear/dissection covered with stent) Resolute Onyx 2.5 mm x 15 mm (2.6 mm   CORONARY STENT INTERVENTION N/A 07/05/2019   Procedure: CORONARY STENT INTERVENTION;  Surgeon: Anner Alm ORN, MD;  Location: [facility] INVASIVE CV LAB;  Service: Cardiovascular;; ostial D1 95% (restenosis of PTCA site) ->  DES PCI RESOLUTE ONYX 2.25 mm x 50 mm (2.6 mm) positioned to avoid the true ostium.    ESOPHAGOGASTRODUODENOSCOPY N/A 03/13/2014   Procedure: ESOPHAGOGASTRODUODENOSCOPY (EGD);  Surgeon: Lesta JULIANNA Fitz, MD;  Location: [facility] ENDOSCOPY;  Service: Endoscopy;  Laterality: N/A;   EYE SURGERY     FRACTURE SURGERY     JOINT REPLACEMENT     LEFT HEART CATH AND CORONARY ANGIOGRAPHY N/A 02/26/2019   Procedure: LEFT HEART CATH AND CORONARY ANGIOGRAPHY;  Surgeon: Anner Alm ORN, MD;  Location: [facility] INVASIVE CV LAB;  EF 55-65%. mCx 65% (FFR 0.82 - Med Rx). D2 85% - Wolverine Scoring Balloon PTCA (2.0 mm). Ost rPDA - 90% -Resolute Onyx 2.5 x 15 (2.6 mm).    LEFT HEART CATH AND CORONARY ANGIOGRAPHY N/A 07/05/2019   Procedure: LEFT HEART CATH AND CORONARY ANGIOGRAPHY;  Surgeon: Anner Alm ORN, MD;  Location: [facility] INVASIVE CV LAB;; ostial D1 95% (restenosis of PTCA site) -> DES PCI.  PDA stent widely patent.  Mid CX stable 65% lesion.  Proximal RCA 40%.  Mid RCA 45%.  Normal EDP.   LEFT HEART CATH AND CORONARY ANGIOGRAPHY N/A 11/10/2021   Procedure: LEFT HEART CATH AND CORONARY ANGIOGRAPHY;  Surgeon: Anner Alm ORN, MD;  Location: [facility] INVASIVE CV LAB;  Service: CV:: : Stable mid LCx with 5% (RFR 0.95).  Stable proximal RCA 40% and mid RCA 55%.  D1 and RPDA stents widely patent (   NM MYOVIEW  LTD  10/20/2021   Horizontal ST depressions in V4 and V5, frequent exercise-induced PVCs.  Inferior patient defect improves with stress-consistent with artifact.  Positive EKG, negative perfusion imaging.  Read as moderate risk study because of exertional ectopy.  Normal EF.   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   RIGHT HEART CATH N/A 01/26/2023   Procedure: RIGHT HEART CATH;  Surgeon: Cherrie Toribio SAUNDERS, MD;  Location: [facility] INVASIVE CV LAB;  Service: Cardiovascular;  Laterality: N/A;   SHOULDER ARTHROSCOPY Bilateral    SHOULDER ARTHROSCOPY W/ ROTATOR CUFF REPAIR Bilateral 2004-2009   left 2004, right 2009    SPINE SURGERY     TOTAL HIP ARTHROPLASTY Left 03/04/2020   Procedure: TOTAL HIP ARTHROPLASTY ANTERIOR APPROACH;  Surgeon: Melodi Lerner, MD;  Location: [facility];  Service: Orthopedics;  Laterality: Left;    TOTAL HIP ARTHROPLASTY Right 09/30/2020   Procedure: TOTAL HIP ARTHROPLASTY ANTERIOR APPROACH;  Surgeon: Melodi Lerner, MD;  Location: [facility];  Service: Orthopedics;  Laterality: Right;    TRANSTHORACIC ECHOCARDIOGRAM  02/2020; 10/01/2020   a) EF 55 to 60%.  GR 1 DD.  No R WMA.  Trivial MR.  Otherwise normal.; b) EF 55 to 60%.  Normal LV function and no wall motion abnormality.  Normal RV.  Normal valves..   TRANSTHORACIC ECHOCARDIOGRAM  10/18/2021   Normal LV function with a EF of 65%.  No RWMA.  GR 1 DD.  Normal valves.  Mild concentric LVH.  Mild MR and mild-moderate  AI.   VASECTOMY     WISDOM TOOTH EXTRACTION       Current Meds  Medication Sig   acetaminophen  (TYLENOL ) 500 MG tablet Take 1,000 mg by mouth every 6 (six) hours as needed for moderate pain.   albuterol  (VENTOLIN  HFA) 108 (90 Base) MCG/ACT inhaler Inhale 2 puffs into the lungs every 6 (six) hours as needed for wheezing or shortness of breath.   Alirocumab  (PRALUENT ) 150 MG/ML SOAJ Inject 1 mL (150 mg total) into the skin every 14 (fourteen) days.   aspirin  EC 81 MG tablet Take 1 tablet (81 mg total) by mouth daily. Swallow whole. ( start after stopping clopidogrel  75 mg)   bimatoprost (LUMIGAN) 0.03 % ophthalmic solution Place 1 drop into both eyes at bedtime.   furosemide  (LASIX ) 20 MG tablet Take 3 tablets (60 mg total) by mouth daily.   ibuprofen (ADVIL) 200 MG tablet Take 400 mg by mouth every 6 (six) hours as needed for moderate pain.   irbesartan  (AVAPRO ) 150 MG tablet Take 1 tablet (150 mg total) by mouth daily.   potassium chloride  (KLOR-CON  M) 10 MEQ tablet Take 20 mEq by mouth daily.   sertraline  (ZOLOFT ) 50 MG tablet TAKE 1 TABLET BY MOUTH EVERY DAY   traZODone  (DESYREL ) 50 MG tablet Take one to two  tablets by mouth at night as needed for insomnia.   [DISCONTINUED] amLODipine  (NORVASC ) 5 MG tablet Take 1 tablet (5 mg total) by mouth daily.   [DISCONTINUED] sildenafil  (REVATIO ) 20 MG tablet Take 2 tablets (40 mg total) by mouth 3 (three) times daily.     Allergies:   Penicillins and Statins   Social History   Tobacco Use   Smoking status: Former    Current packs/day: 0.00    Average packs/day: 1 pack/day for 5.0 years (5.0 ttl pk-yrs)    Types: Cigarettes    Start date: 10/18/1963    Quit date: 10/17/1968    Years since quitting: 55.3   Smokeless tobacco: Never  Vaping Use   Vaping status: Never Used  Substance Use Topics   Alcohol use: Not Currently    Comment: quit drinking ~ 10/1968   Drug use: No     Family Hx: The patient's family history includes Arthritis in his sister; Heart disease in his mother; Hypertension in his father and mother; Stroke (age of onset: 54) in his sister.   Labs/Other Tests and Data Reviewed:    EKG:  {ZXH:[id number]}  Recent Labs: 06/13/2023: ALT 21; Hemoglobin 15.1; Platelets 246.0 09/20/2023: B Natriuretic Peptide 24.6; BUN 14; Creatinine, Ser 1.11; Potassium 4.7; Sodium 137   Recent Lipid Panel Lab Results  Component Value Date/Time   CHOL 130 06/13/2023 08:42 AM   CHOL 127 11/14/2022 08:31 AM   TRIG 188.0 (H) 06/13/2023 08:42 AM   HDL 40.60 06/13/2023 08:42 AM   HDL 44 11/14/2022 08:31 AM   CHOLHDL 3 06/13/2023 08:42 AM   LDLCALC 52 06/13/2023 08:42 AM   LDLCALC 55 11/14/2022 08:31 AM   LDLCALC 98 05/06/2020 08:42 AM   LDLDIRECT 129.0 08/19/2016 08:38 AM    Wt Readings from Last 3 Encounters:  02/19/24 188 lb (85.3 kg)  12/27/23 187 lb (84.8 kg)  12/25/23 193 lb (87.5 kg)     Objective:    Vital Signs:  BP 110/62   Pulse 76   Ht 6' 1 (1.854 m)   Wt 188 lb (85.3 kg)   SpO2 96%   BMI 24.80 kg/m   {Heart[facility] Virtual Exam (  Optional):[phone]::VITAL SIGNS:   reviewed}   ==========================================  COVID-19 Education: The signs and symptoms of COVID-19 were discussed with the patient and how to seek care for testing (follow up with PCP or arrange E-visit).   The importance of social distancing was discussed today.  Time:   Today, I have spent *** minutes with the patient with telehealth technology discussing the above problems.   An additional ***minutes spent charting (reviewing prior notes, hospital records, studies, labs etc.) Total ***minutes   Medication Adjustments/Labs and Tests Ordered: Current medicines are reviewed at length with the patient today.  Concerns regarding medicines are outlined above.   Patient Instructions  Medication Instructions:    Restart taking Bisoprolol  2.5 mg  on 02/20/24.  Stop taking Amlodipine   5 mg .   Sliding Scale for increase weight of 3 lbs or more Sliding scale Lasix : Your dry weight will be 187 lbs.  If you gain more than 3 pounds from dry weight: Increase the Lasix  dosing to 60 mg in the morning and 40 mg in the afternoon until weight returns to baseline dry weight. If the weight goes down more than 3 pounds from dry weight: Hold Lasix  until it returns to baseline dry weight    *If you need a refill on your cardiac medications before your next appointment, please call your pharmacy*   Lab Work: Not needed    Testing/Procedures: Not needed   Follow-Up: At [facility], you and your health needs are our priority.  As part of our continuing mission to provide you with exceptional heart care, we have created designated Provider Care Teams.  These Care Teams include your primary Cardiologist (physician) and Advanced Practice Providers (APPs -  Physician Assistants and Nurse Practitioners) who all work together to provide you with the care you need, when you need it.     Your next appointment:   3 month(s)  The format for your next appointment:   In  Person  Provider:   Alm Clay, MD     Signed, Alm Clay, MD  02/22/2024 8:02 PM    [facility]

## 2024-02-22 NOTE — Assessment & Plan Note (Signed)
 Multiple studies have indicated that patient is not likely caused by macrovascular or even potentially microvascular ischemia therefore Ranexa  was discontinued. - Continue aspirin  81 mg daily. - Because of his palpitations we will convert back from amlodipine  to bisoprolol  restarting his bisoprolol  2.5 mg daily and stopping the amlodipine  -Continue Avapro  150 mg daily - Statin intolerant-on Praluent  injections every 2 weeks.

## 2024-02-22 NOTE — Assessment & Plan Note (Signed)
 Very prone to side effects, had significant myalgias with multiple different statins.  Has not been started on Praluent  and doing well.  Most recent LDL was 78 meaning that he is not immune at goal on Praluent .  Not able to tolerate any other medications

## 2024-02-22 NOTE — Assessment & Plan Note (Signed)
 LDL not quite at goal as of April despite being on Praluent .  Intolerant to statins leaving Praluent  is only real option.  Continue to monitor, if still not at goal may want to add Nexletol or Zetia.

## 2024-02-22 NOTE — Assessment & Plan Note (Signed)
.    Intermittent pounding or irregular heartbeat sensations.  For this reason we will restart beta-blocker and in replace of amlodipine .

## 2024-02-22 NOTE — Assessment & Plan Note (Signed)
 As usual BP well-controlled. -Continue Avapro  150 mg daily but exchange amlodipine  for bisoprolol  2.5 mg daily and continue furosemide  standing dose with PRN

## 2024-02-22 NOTE — Assessment & Plan Note (Signed)
 Persistent chest pressure and dyspnea despite medication adjustments.  No evidence of small vessel disease or reduced myocardial blood flow. Possible pulmonary component considered. - Restart bisoprolol  2.5 mg in the morning, discontinue amlodipine . - Continue sildenafil  20 mg TID.

## 2024-02-26 NOTE — Telephone Encounter (Signed)
 Great.  I was able to read Dr. Connell brief note in Epic.  I think it is a reasonable plan.  DH

## 2024-04-05 ENCOUNTER — Telehealth (HOSPITAL_COMMUNITY): Payer: Self-pay | Admitting: Cardiology

## 2024-04-05 NOTE — Telephone Encounter (Signed)
 Patient called to report [facility] was notified by [facility], a new community care referral was needed  Referral was faxed 09/2023, new referral faxed to [facility]  fax# [phone]

## 2024-04-10 ENCOUNTER — Other Ambulatory Visit: Payer: Self-pay

## 2024-04-11 ENCOUNTER — Ambulatory Visit (HOSPITAL_COMMUNITY)
Admission: RE | Admit: 2024-04-11 | Discharge: 2024-04-11 | Disposition: A | Discharge status: Home / Self Care | Source: Ambulatory Visit | Attending: Internal Medicine | Admitting: Internal Medicine

## 2024-04-11 ENCOUNTER — Encounter (HOSPITAL_COMMUNITY): Payer: Self-pay | Admitting: Internal Medicine

## 2024-04-11 VITALS — BP 144/78 | HR 53 | Wt 191.4 lb

## 2024-04-11 DIAGNOSIS — E119 Type 2 diabetes mellitus without complications: Secondary | ICD-10-CM | POA: Diagnosis not present

## 2024-04-11 DIAGNOSIS — Z8249 Family history of ischemic heart disease and other diseases of the circulatory system: Secondary | ICD-10-CM | POA: Diagnosis not present

## 2024-04-11 DIAGNOSIS — I2510 Atherosclerotic heart disease of native coronary artery without angina pectoris: Secondary | ICD-10-CM | POA: Insufficient documentation

## 2024-04-11 DIAGNOSIS — Z955 Presence of coronary angioplasty implant and graft: Secondary | ICD-10-CM | POA: Insufficient documentation

## 2024-04-11 DIAGNOSIS — E785 Hyperlipidemia, unspecified: Secondary | ICD-10-CM | POA: Diagnosis not present

## 2024-04-11 DIAGNOSIS — K219 Gastro-esophageal reflux disease without esophagitis: Secondary | ICD-10-CM | POA: Insufficient documentation

## 2024-04-11 DIAGNOSIS — R0600 Dyspnea, unspecified: Secondary | ICD-10-CM | POA: Diagnosis not present

## 2024-04-11 DIAGNOSIS — Z87891 Personal history of nicotine dependence: Secondary | ICD-10-CM | POA: Insufficient documentation

## 2024-04-11 DIAGNOSIS — I10 Essential (primary) hypertension: Secondary | ICD-10-CM | POA: Insufficient documentation

## 2024-04-11 DIAGNOSIS — R0789 Other chest pain: Secondary | ICD-10-CM | POA: Insufficient documentation

## 2024-04-11 DIAGNOSIS — Z79899 Other long term (current) drug therapy: Secondary | ICD-10-CM | POA: Insufficient documentation

## 2024-04-11 DIAGNOSIS — I2089 Other forms of angina pectoris: Secondary | ICD-10-CM

## 2024-04-11 DIAGNOSIS — R079 Chest pain, unspecified: Secondary | ICD-10-CM | POA: Diagnosis not present

## 2024-04-11 DIAGNOSIS — R0609 Other forms of dyspnea: Secondary | ICD-10-CM

## 2024-04-11 NOTE — Progress Notes (Signed)
 ADVANCED HF CLINIC NOTE   Primary Care: Micheal Wolm ORN, MD Primary Cardiologist: Alm Clay, MD HF Cardiologist: Dr. Cherrie  HPI: Luis Guzman is a 78 y.o. male with CAD s/p PCI. DM2, HTN, HL, GERD referred by Dr. Clay for further evaluation of persistent exertional CP and dyspnea of unclear etiology.   Cath 3/23 LAD ok D1 stent patent LCx 55%m RCA 40%prox 55%m RPDA stent patent  Echo 2/24 EF 55-60% G1DD RV ok  CPX testing  12/14/22 showed:  FVC 4.12 (92%)      FEV1 3.34 (101%)        FEV1/FVC 81 (108%)        MVV 151 (115%)   Resting HR: 83 Standing HR: 85 Peak HR: 127   (88% age predicted max HR)  BP rest: 130/74 BP Standing: 108/74 BP peak: 178/64  Peak VO2: 20.9 (84% predicted peak VO2)  VE/VCO2 slope:  43  OUES: 1.68  Peak RER: 1.17  Ventilatory Threshold: 16.5 (66% predicted peak VO2)  Peak RR 36  Peak Ventilation:  91.7  VE/MVV:  61%  PETCO2 at peak:  29  O2pulse:  14   (93% predicted O2pulse)   Exercise RHC showed normal pulmonary pressures at rest. PA pressures increased with exercise with no significant change in PCWP PET cardiac perfusion 06/07/23: EF 61% normal perfusion with stress  Based of exercise RHC we stop Imdur  and started sildenafil  for exercise-induced PAH. Also started sertraline .  At our last visit, I referred him to the [facility] and was started on Opsumit which he did not toelrate due to volume overload. Switched back to Ranexa . He was offered f/u there in the fall with 9intracoronary functional testing (microvascular disease/endothelial dysfunction) and a repeat right heart catheterization at rest and with exercise to investigate his dyspnea further. During that w/u also some suspicion for ILD and had repeat CPX, PFTs and hi-res CT.  PFTs 4/25 FEV1 3.2 FVC 4.225 DLCO 26 Hi-res CT 4/25: Very mild fibrotic disease  Here with his wife. Says he doesn't feel any better. No benefit from Ranexa . Sildenafil  cut back to 20 tid. No longer  exercising. Still having frequent CP and hoarseness.    Cardiac Studies  Rest Ao = 136/75 RA = 2 RV = 31/5 PA = 32/16 (18) PCW = 11 Fick cardiac output/index = 4.9/2.4 Thermo CO/CI = 6.2/3.0  PVR = 1.2 WU FA sat = 99% PA sat = 71%, 72%   Peak exercise Ao = 164/83 PA = 61/15 (36) PCW = 17 Thermo CO/CI = 13.8/6.6 PVR = 1.4 WU  Past Medical History:  Diagnosis Date   Allergy    Anemia    as infant history of   CAD S/P PTCA & DES PCI - for Progressive Angina 02/26/2019   Cath-PCI 02/26/2019: EF 55-65%. mCx 65% (FFR 0.82 - Med Rx). D2 85% - Wolverine Scoring Balloon PTCA (2.0 mm). Ost rPDA - 90% -Resolute Onyx 2.5 x 15 (2.6 mm). 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 lower back pain, s/p cervical neck surgerystiffness remains   Transfusion history    infant anemia   Current Outpatient Medications  Medication Sig Dispense Refill   acetaminophen  (TYLENOL ) 500 MG tablet Take 1,000 mg by mouth every 6 (six) hours as needed for moderate pain.     albuterol  (VENTOLIN  HFA) 108 (90 Base) MCG/ACT inhaler Inhale 2 puffs into the lungs every 6 (six) hours as needed for wheezing or shortness of breath. 8 g 3   Alirocumab  (PRALUENT ) 150 MG/ML SOAJ Inject 1 mL (150 mg total) into the skin every 14 (fourteen) days. 6 mL 3   aspirin  EC 81 MG tablet Take 1 tablet (81 mg total) by mouth daily. Swallow whole. ( start after stopping clopidogrel  75 mg) 90 tablet 3   bimatoprost (LUMIGAN) 0.03 % ophthalmic solution Place 1 drop into both eyes at  bedtime.     bisoprolol  (ZEBETA ) 5 MG tablet Take 0.5 tablets (2.5 mg total) by mouth daily.     furosemide  (LASIX ) 20 MG tablet Take 3 tablets (60 mg total) by mouth daily. 270 tablet 3   ibuprofen (ADVIL) 200 MG tablet Take 400 mg by mouth every 6 (six) hours as needed for moderate pain.     irbesartan  (AVAPRO ) 150 MG tablet Take 1 tablet (150 mg total) by mouth daily. 90 tablet 3   potassium chloride  (KLOR-CON  M) 10 MEQ tablet Take 20 mEq by mouth daily.     sertraline  (ZOLOFT ) 50 MG tablet TAKE 1 TABLET BY MOUTH EVERY DAY 90 tablet 1   sildenafil  (REVATIO ) 20 MG tablet Take 1 tablet (20 mg total) by mouth 3 (three) times daily.     traZODone  (DESYREL ) 50 MG tablet Take one to two tablets by mouth at night as needed for insomnia. 180 tablet 3   No current facility-administered medications for this encounter.   Allergies  Allergen Reactions   Penicillins Itching    Has patient had a PCN reaction causing immediate rash, facial/tongue/throat swelling, SOB or lightheadedness with hypotension: no Has patient had a PCN reaction causing severe rash involving mucus membranes or skin necrosis: unkn Has patient had a PCN reaction that required hospitalization: no Has patient had a PCN reaction occurring within the last 10 years: no If all of the above answers are NO, then may proceed with Cephalosporin use.  Tolerated Cephalosporin Date: 10/01/20.   Statins     Muscle pain   Social History   Socioeconomic History   Marital status: Married    Spouse name: Not on file   Number of children: Not on file   Years of education: 16   Highest education level: Bachelor's degree (e.g., BA, AB, BS)  Occupational History   Not on file  Tobacco Use   Smoking status: Former    Current packs/day: 0.00    Average packs/day: 1 pack/day for 5.0 years (5.0 ttl pk-yrs)    Types: Cigarettes    Start date: 10/18/1963    Quit date: 10/17/1968    Years since quitting: 55.5   Smokeless tobacco: Never   Vaping Use   Vaping status: Never Used  Substance and Sexual Activity   Alcohol use: Not Currently    Comment: quit drinking ~ 10/1968   Drug use: No   Sexual activity: Not on file  Other Topics Concern   Not on file  Social History Narrative   Not on file   Social Drivers of Health   Financial  Resource Strain: Low Risk  (12/27/2023)   Overall Financial Resource Strain (CARDIA)    Difficulty of Paying Living Expenses: Not very hard  Food Insecurity: No Food Insecurity (12/27/2023)   Hunger Vital Sign    Worried About Running Out of Food in the Last Year: Never true    Ran Out of Food in the Last Year: Never true  Transportation Needs: No Transportation Needs (12/27/2023)   PRAPARE - Administrator, Civil Service (Medical): No    Lack of Transportation (Non-Medical): No  Physical Activity: Inactive (12/17/2023)   Exercise Vital Sign    Days of Exercise per Week: 0 days    Minutes of Exercise per Session: 30 min  Stress: No Stress Concern Present (12/17/2023)   [employer] of Occupational Health - Occupational Stress Questionnaire    Feeling of Stress : Not at all  Social Connections: Unknown (12/27/2023)   Social Connection and Isolation Panel    Frequency of Communication with Friends and Family: More than three times a week    Frequency of Social Gatherings with Friends and Family: More than three times a week    Attends Religious Services: Patient declined    Database administrator or Organizations: No    Attends Engineer, structural: More than 4 times per year    Marital Status: Married  Catering manager Violence: Not At Risk (12/27/2023)   Humiliation, Afraid, Rape, and Kick questionnaire    Fear of Current or Ex-Partner: No    Emotionally Abused: No    Physically Abused: No    Sexually Abused: No   Family History  Problem Relation Age of Onset   Hypertension Mother    Heart disease Mother    Hypertension Father    Arthritis Sister         rheumatoid   Stroke Sister 60   BP (!) 144/78   Pulse (!) 53   Wt 86.8 kg (191 lb 6.4 oz)   SpO2 97%   BMI 25.25 kg/m   Wt Readings from Last 3 Encounters:  04/11/24 86.8 kg (191 lb 6.4 oz)  02/19/24 85.3 kg (188 lb)  12/27/23 84.8 kg (187 lb)   PHYSICAL EXAM: General:  Normal appearing. No resp difficulty HEENT: normal Neck: supple. no JVD. Carotids 2+ bilat; no bruits. No lymphadenopathy or thryomegaly appreciated. Cor: PMI nondisplaced. Regular rate & rhythm. No rubs, gallops or murmurs. Lungs: clear Abdomen: soft, nontender, nondistended. No hepatosplenomegaly. No bruits or masses. Good bowel sounds. Extremities: no cyanosis, clubbing, rash, edema Neuro: alert & orientedx3, cranial nerves grossly intact. moves all 4 extremities w/o difficulty. Affect pleasant    ASSESSMENT & PLAN:  1. Exertional CP and dyspnea - CPX testing shows relatively well preserved functional capacity but high VE/VCO2 slope suggestive of elevated pulmonary pressures with exercise +/- end-exercise hyperventilation - exercise RHC shows normal hemodynamics at rest but significant elevation in PA pressures with exercise with a normal PCWP - PET cardiac perfusion 06/07/23: EF 61% normal perfusion with stress - Continue with symptoms despite Imdur  to 60 am/30 pm - Based on cath, normal CPX and PET perfusion (had symptoms during test) we do not have any objective evidence for myocardial ischemia as direct cause of his symptoms. - Currently only objective abnormality we have is rise in pulmonary pressures with exercise which can be a normal variant in elderly patients but can stil cause symptoms - He has mild benefit from a trial of sildenafil  but still with prominent resting  symptoms - He has had an extensive w/u at [facility] (I have reviewed all notes and studies personally - > 50 minutes of review) with similar findings to what we have found here. They have tried Opsumit and Ranexa  and bot have failed to  provide relief. [facility] has suggested testing for microvascular dysfunction but he has already been treated for this without benefit so not sure of the value of testing. I considered alternate, non-cardiac diagnoses like vocal cord dysfunction but spirometry on CPX testing does not support - At this point I really have nothing left to offer him and told him and his wife this. I am happy to see him back on a PRN basis. We did discuss referral to a Pain Clinic and they will review with Dr. Anner.   I spent a total of 60 minutes today: 1) reviewing the patient's medical records including previous charts, labs and recent notes from other providers; 2) examining the patient and counseling them on their medical issues/explaining the plan of care; 3) adjusting meds as needed and 4) ordering lab work or other needed tests.    Toribio Fuel, MD  3:02 PM

## 2024-04-11 NOTE — Patient Instructions (Addendum)
 Great to see you today!!!  Please continue current medications  Your physician recommends that you schedule a follow-up appointment with Dr Genice office as scheduled and our office only AS NEEDED

## 2024-04-11 NOTE — Patient Outreach (Signed)
 Complex Care Management   Visit Note    Name:  Luis Guzman MRN: [id number] DOB: [birth year]  Situation: Referral received for Complex Care Management related to Hypertension and CAD. I obtained verbal consent from Patient.  Visit completed with Luis Guzman via telephone.  Background:   Past Medical History:  Diagnosis Date   Allergy    Anemia    as infant history of   CAD S/P PTCA & DES PCI - for Progressive Angina 02/26/2019   Cath-PCI 02/26/2019: EF 55-65%. mCx 65% (FFR 0.82 - Med Rx). D2 85% - Wolverine Scoring Balloon PTCA (2.0 mm). Ost rPDA - 90% -Resolute Onyx 2.5 x 15 (2.6 mm). 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 lower back pain, s/p cervical neck surgerystiffness remains   Transfusion history    infant anemia       Assessment: Patient Reported Symptoms: Cognitive Cognitive Status: Alert and oriented to person, place, and time, Normal speech and language skills Cognitive/Intellectual Conditions Management [RPT]: None reported or documented in medical history or problem list Health Maintenance Behaviors: Annual physical exam, Healthy diet, Social activities Healing Pattern: Average Health Facilitated by: Healthy diet, Rest  Neurological Neurological Review of Symptoms: No symptoms reported Neurological Management Strategies: Routine screening Neurological Self-Management Outcome: 4 (good)  HEENT HEENT Symptoms Reported: No symptoms reported HEENT Self-Management Outcome: 4 (good)  Cardiovascular Cardiovascular Symptoms  Reported: Fatigue Does patient have uncontrolled Hypertension?: No Cardiovascular Comment: Reports increased fatigue over the past week. Also reports episodes of intermittent palpitation  which has been chronic. He is pending outreach with the Cardiology team on April 11, 2025  Respiratory Respiratory Symptoms Reported: No symptoms reported  Endocrine Endocrine Symptoms Reported: No symptoms reported Is patient diabetic?: No  Gastrointestinal Gastrointestinal Symptoms Reported: No symptoms reported  Genitourinary Genitourinary Symptoms Reported: No symptoms reported  Integumentary Integumentary Symptoms Reported: No symptoms reported  Musculoskeletal Musculoskelatal Symptoms Reviewed: No symptoms reported  Psychosocial Psychosocial Symptoms Reported: No symptoms reported    There were no vitals filed for this visit. Outpatient Encounter Medications as of 04/10/2024  Medication Sig Note   acetaminophen  (TYLENOL ) 500 MG tablet Take 1,000 mg by mouth every 6 (six) hours as needed for moderate pain.    albuterol  (VENTOLIN  HFA) 108 (90 Base) MCG/ACT inhaler Inhale 2 puffs into the lungs every 6 (six) hours as needed for wheezing or shortness of breath.    Alirocumab  (PRALUENT ) 150 MG/ML SOAJ Inject 1 mL (150 mg total) into the skin every 14 (fourteen) days.    aspirin  EC 81 MG tablet Take 1 tablet (81 mg total) by mouth daily. Swallow whole. ( start after stopping clopidogrel  75 mg)    bimatoprost (LUMIGAN) 0.03 % ophthalmic solution Place 1 drop into both eyes at bedtime.    bisoprolol  (ZEBETA ) 5 MG tablet Take 0.5 tablets (2.5 mg total) by mouth daily.    furosemide  (LASIX ) 20 MG tablet Take 3 tablets (60 mg total) by mouth daily.  ibuprofen (ADVIL) 200 MG tablet Take 400 mg by mouth every 6 (six) hours as needed for moderate pain.    irbesartan  (AVAPRO ) 150 MG tablet Take 1 tablet (150 mg total) by mouth daily.    potassium chloride  (KLOR-CON  M) 10 MEQ tablet Take 20 mEq by mouth daily.     sertraline  (ZOLOFT ) 50 MG tablet TAKE 1 TABLET BY MOUTH EVERY DAY    sildenafil  (REVATIO ) 20 MG tablet Take 1 tablet (20 mg total) by mouth 3 (three) times daily.    traZODone  (DESYREL ) 50 MG tablet Take one to two tablets by mouth at night as needed for insomnia. 09/27/2023: Currently taking two as needed   [DISCONTINUED] Brinzolamide-Brimonidine (SIMBRINZA) 1-0.2 % SUSP Place 1 drop into both eyes in the morning and at bedtime. (Patient not taking: Reported on 02/19/2024)    [DISCONTINUED] macitentan (OPSUMIT) 10 MG tablet Take 10 mg by mouth daily. (Patient not taking: Reported on 02/19/2024)    [DISCONTINUED] predniSONE  (DELTASONE ) 20 MG tablet Take two tablets by mouth once daily for 5 days (Patient not taking: Reported on 04/10/2024)    No facility-administered encounter medications on file as of 04/10/2024.    Recommendation:   Continue Current Plan of Care  Follow Up Plan:   Telephone follow up appointment with Nurse Case Manager on May 14, 2024    Jackson Acron [facility] RN Care Manager Direct Dial: [phone]  Fax: [phone] Website: [email or web address]

## 2024-04-12 NOTE — Patient Instructions (Addendum)
 Thank you for allowing the Complex Care Management team to participate in your care. It was great speaking with you!  We will follow up on May 14, 2024 at 1100. Please do not hesitate to contact me if you require assistance prior to our next outreach.   Jackson Acron [facility] Population Health RN Care Manager Direct Dial: [phone]  Fax: [phone] Website: [email or web address]

## 2024-04-22 ENCOUNTER — Ambulatory Visit: Admitting: Cardiology

## 2024-05-10 NOTE — Progress Notes (Signed)
 Luis Guzman                                          MRN: [id number]   05/10/2024   The [facility] Quality Team Specialist reviewed this patient medical record for the purposes of chart review for care gap closure. The following were reviewed: chart review for care gap closure-kidney health evaluation for diabetes:eGFR  and uACR.Patient has appt 06/25/2024    [facility] Quality Team

## 2024-05-13 ENCOUNTER — Other Ambulatory Visit (HOSPITAL_BASED_OUTPATIENT_CLINIC_OR_DEPARTMENT_OTHER): Payer: Self-pay

## 2024-05-13 MED ORDER — COMIRNATY 30 MCG/0.3ML IM SUSY
0.3000 mL | PREFILLED_SYRINGE | Freq: Once | INTRAMUSCULAR | 0 refills | Status: AC
  Filled 2024-05-13: qty 0.3, 1d supply, fill #0

## 2024-05-14 ENCOUNTER — Telehealth

## 2024-05-22 ENCOUNTER — Ambulatory Visit: Attending: Cardiology | Admitting: Cardiology

## 2024-05-22 VITALS — BP 114/62 | HR 56 | Ht 72.0 in | Wt 192.8 lb

## 2024-05-22 DIAGNOSIS — R079 Chest pain, unspecified: Secondary | ICD-10-CM

## 2024-05-22 DIAGNOSIS — I10 Essential (primary) hypertension: Secondary | ICD-10-CM

## 2024-05-22 DIAGNOSIS — R0609 Other forms of dyspnea: Secondary | ICD-10-CM

## 2024-05-22 DIAGNOSIS — I2510 Atherosclerotic heart disease of native coronary artery without angina pectoris: Secondary | ICD-10-CM

## 2024-05-22 DIAGNOSIS — E785 Hyperlipidemia, unspecified: Secondary | ICD-10-CM

## 2024-05-22 DIAGNOSIS — I272 Pulmonary hypertension, unspecified: Secondary | ICD-10-CM

## 2024-05-22 DIAGNOSIS — I25119 Atherosclerotic heart disease of native coronary artery with unspecified angina pectoris: Secondary | ICD-10-CM | POA: Diagnosis not present

## 2024-05-22 DIAGNOSIS — T466X5D Adverse effect of antihyperlipidemic and antiarteriosclerotic drugs, subsequent encounter: Secondary | ICD-10-CM

## 2024-05-22 DIAGNOSIS — Z9861 Coronary angioplasty status: Secondary | ICD-10-CM

## 2024-05-22 DIAGNOSIS — R42 Dizziness and giddiness: Secondary | ICD-10-CM

## 2024-05-22 DIAGNOSIS — T466X5A Adverse effect of antihyperlipidemic and antiarteriosclerotic drugs, initial encounter: Secondary | ICD-10-CM

## 2024-05-22 DIAGNOSIS — M791 Myalgia, unspecified site: Secondary | ICD-10-CM

## 2024-05-22 DIAGNOSIS — G8929 Other chronic pain: Secondary | ICD-10-CM

## 2024-05-22 MED ORDER — IRBESARTAN 150 MG PO TABS: 75.0000 mg | ORAL_TABLET | Freq: Every day | ORAL | Status: AC

## 2024-05-22 MED ORDER — RANOLAZINE ER 1000 MG PO TB12: 1000.0000 mg | ORAL_TABLET | Freq: Two times a day (BID) | ORAL | 3 refills | Status: AC

## 2024-05-22 MED ORDER — POTASSIUM CHLORIDE CRYS ER 10 MEQ PO TBCR: 20.0000 meq | EXTENDED_RELEASE_TABLET | Freq: Every day | ORAL | 3 refills | Status: AC

## 2024-05-22 MED ORDER — PRALUENT 150 MG/ML ~~LOC~~ SOAJ: 150.0000 mg | SUBCUTANEOUS | 3 refills | Status: AC

## 2024-05-22 MED ORDER — FUROSEMIDE 20 MG PO TABS: 60.0000 mg | ORAL_TABLET | Freq: Every day | ORAL | 3 refills | Status: AC

## 2024-05-22 NOTE — Progress Notes (Signed)
 " Cardiology Office Note:  .   Date:  05/23/2024  ID:  Kaushik, Maul [birth year], MRN [id number] PCP: Micheal Wolm LELON, MD  [facility] Providers Cardiologist:  Alm Clay, MD     Chief Complaint  Patient presents with   Follow-up    Still having chest pain and dyspnea.   Coronary Artery Disease    Patient Profile: .     MARQUI FORMBY is a 78 y.o. male with a PMH reviewed below who presents here for 50-month follow-up at the request of Burchette, Wolm LELON, MD.  NAGEE GOATES is a 78 y.o. male with a PMH notable for  who presents here for 2 V CAD-PCI, HLD & Ongoing CP & Dyspnea both @ Rest & with Exertion who presents here for follow-up after outside referral to [facility] for third opinion  He was originally referred for anginal symptoms at the request of Micheal Wolm LELON, MD, cardiac catheterization revealed significant stenosis in the ostial LAD and proximal D1 and PDA.  Also moderate disease in the mid LCx.  Initial treatment was with DES PCI to the ostial PDA with PTCA only of the diagonal.  Subsequently he had recurrent angina and ended up stenting the ostial diagonal.  After initially having relief of symptoms, he has had recurrence of symptoms now better.  Essentially unmanageable.  He has had multiple evaluations with relook catheterization and stress test etc. all of which have shown nonischemic findings.  He was referred to Dr. Cherrie advanced heart failure for assessment of possible exercise-induced pulmonary pretension.  Initially started on sildenafil  which did not necessarily show any benefit.  He has subsequently now gone to [facility] seen by Dr. Albino.  => Initial treatment was to DC Ranexa  and wean down sildenafil  dose to 20 mg daily while initiating Opsumit. Additionally, he has had significant myalgia due to statins and is now on Praluent .     I last saw Christopher LELON Jama on February 19, 2024 by telehealth medicine: He noted that he been having ongoing chest  pressure and dyspnea for about a month.  Symptoms described as intense chest pain accompanied by pounding sensation typically beginning in the mid afternoon and persisting through the evening.  Not really alleviated by anything in particular.  Also noting exertional dyspnea with minimal exertion such as pumping gas or taking a shower.  At this point he was not taking Opsumit or bisoprolol  but had been using Lasix  60 mg daily.  His weights are ranging anywhere from 180 the 8 to 189 pounds however his dry weight was 185 pounds.  Phone call with Dr. Mauri from [facility] A trial of Opsumit was unsuccessful. His tendency to fluid retention has improved with discontinuation of this medication. He is still having episodic dyspnea and chest discomfort. Four days ago, his local cardiologist, Dr. Clay, discontinued amlodipine  and restarted bisoprolol .  I have recommended that he restart ranolazine  in a few weeks, initially at a dose of 500 mg twice a day, and then 1000 mg twice a day to see if this improves his symptoms. This should ideally be done under the auspices of Dr. Clay.  At some stage, perhaps in the fall, Mr. Launer is welcome to return to [facility] for further testing. This may include a return visit to the cath lab for intracoronary functional testing (microvascular disease/endothelial dysfunction) and a repeat right heart catheterization at rest and with exercise to investigate his dyspnea further. He is open to  the idea of doing so.   He was seen by Dr. Cherrie from advanced heart failure on 04/11/2024.  Noted no benefit from Ranexa .  Still having frequent chest pain and hoarseness sildenafil  been cut back to 20 mg TID.  Did not tolerate Opsumit and noted no improvement of symptoms. - CPX testing shows relatively well preserved functional capacity but high VE/VCO2 slope suggestive of elevated pulmonary pressures with exercise +/- end-exercise hyperventilation - exercise RHC shows normal  hemodynamics at rest but significant elevation in PA pressures with exercise with a normal PCWP - PET cardiac perfusion 06/07/23: EF 61% normal perfusion with stress - Continue with symptoms despite Imdur  to 60 am/30 pm - Based on cath, normal CPX and PET perfusion (had symptoms during test) we do not have any objective evidence for myocardial ischemia as direct cause of his symptoms. - Currently only objective abnormality we have is rise in pulmonary pressures with exercise which can be a normal variant in elderly patients but can stil cause symptoms - He has mild benefit from a trial of sildenafil  but still with prominent resting symptoms - He has had an extensive w/u at [facility] (I have reviewed all notes and studies personally - > 50 minutes of review) with similar findings to what we have found here. They have tried Opsumit and Ranexa  and bot have failed to provide relief. [facility] has suggested testing for microvascular dysfunction but he has already been treated for this without benefit so not sure of the value of testing. I considered alternate, non-cardiac diagnoses like vocal cord dysfunction but spirometry on CPX testing does not support - At this point I really have nothing left to offer him and told him and his wife this. I am happy to see him back on a PRN basis. We did discuss referral to a Pain Clinic   Subjective  Discussed the use of AI scribe software for clinical note transcription with the patient, who gave verbal consent to proceed.  History of Present Illness ERHARDT DADA is a 78 year old male with coronary artery disease who presents with persistent chest pressure and shortness of breath.  He experiences persistent chest pressure that has been intensifying and occurs daily, accompanied by shortness of breath, lightheadedness, and a burning sensation. These symptoms occur at rest and with activity, such as getting up in the morning, showering, and walking around. The symptoms are  present most days, with occasional days of relief. He describes sitting with his eyes closed, waiting for the pressure to subside, but it often does not go away. Previously, he used nitroglycerin , but it was ineffective, even at doses of three tablets at a time. He has been on Ranexa  since late July, but it has not provided significant relief.  He experiences occasional swelling, particularly in the ankles and the top of his foot, which he manages with furosemide  60 mg daily, taking an extra 20 mg as needed. His weight fluctuates but remains within acceptable parameters, not exceeding a three-pound increase in a day or five pounds in a week. He has not identified any dietary causes for the weight changes.  He reports increased dizziness, which occurs both when standing up and while walking. He describes an episode at a funeral where he felt extremely dizzy. His blood pressure fluctuates, and he has noted that it can rise during the day.  His current medications include Ranexa  1000 mg twice daily, Praluent , sildenafil , Avapro  150 mg, and bisoprolol  2.5 mg. He takes aspirin   and has not noticed any blood in his stool. He mentions a persistent dry cough that seems to correlate with more intense chest pressure. He has a history of shortness of breath for over five years, predating a COVID-19 infection. Previous cardiac catheterization and stenting provided some relief, but his symptoms have returned.  Cardiovascular ROS: positive for - chest pain, dyspnea on exertion, shortness of breath, and trivial edema. negative for - irregular heartbeat, orthopnea, paroxysmal nocturnal dyspnea, rapid heart rate, or syncope or near syncope TIA or emesis fugax from claudication  ROS:  Review of Systems - Negative except symptoms noted above.    Objective   Current Meds Cardiovascular Medication Sig   albuterol  (VENTOLIN  HFA) 108 (90 Base) MCG/ACT inhaler Inhale 2 puffs into the lungs every 6 (six) hours as needed  for wheezing or shortness of breath.   aspirin  EC 81 MG tablet Take 1 tablet (81 mg total) by mouth daily. Swallow whole. ( start after stopping clopidogrel  75 mg)   bisoprolol  (ZEBETA ) 5 MG tablet Take 0.5 tablets (2.5 mg total) by mouth daily.   sildenafil  (REVATIO ) 20 MG tablet Take 1 tablet (20 mg total) by mouth 3 (three) times daily.   [Refilled] Alirocumab  (PRALUENT ) 150 MG/ML SOAJ Inject 1 mL (150 mg total) into the skin every 14 (fourteen) days.   [Refilled] furosemide  (LASIX ) 20 MG tablet Take 3 tablets (60 mg total) by mouth daily.   [Refilled] irbesartan  (AVAPRO ) 150 MG tablet Take 1 tablet (150 mg total) by mouth daily.   [Refilled] potassium chloride  (KLOR-CON  M) 10 MEQ tablet Take 20 mEq by mouth daily.   [Refilled] Ranolazine  (RANEXA  PO) Take 1,000 mg by mouth in the morning and at bedtime.    Medication Sig   acetaminophen  (TYLENOL ) 500 MG tablet Take 1,000 mg by mouth every 6 (six) hours as needed for moderate pain.   bimatoprost (LUMIGAN) 0.03 % ophthalmic solution Place 1 drop into both eyes at bedtime.   ibuprofen (ADVIL) 200 MG tablet Take 400 mg by mouth every 6 (six) hours as needed for moderate pain.   sertraline  (ZOLOFT ) 50 MG tablet TAKE 1 TABLET BY MOUTH EVERY DAY   traZODone  (DESYREL ) 50 MG tablet Take one to two tablets by mouth at night as needed for insomnia.    Studies Reviewed: SABRA   EKG Interpretation Date/Time:  Wednesday May 22 2024 08:36:41 EDT Ventricular Rate:  56 PR Interval:  168 QRS Duration:  90 QT Interval:  464 QTC Calculation: 447 R Axis:   60  Text Interpretation: Sinus bradycardia When compared with ECG of 31-Oct-2023 15:08, No significant change was found Confirmed by Anner Lenis ([id number]) on 05/22/2024 8:48:54 AM    Labs are due to be checked by PCP at the end of the month. 06/13/2023: TC 130, TG 188, HDL 40, LDL 52.  A1c 5.8; Hgb 15.1, PLT 246.Cr 1.11, K+ 4.7  Tests from [facility] VQ scan 11/28/2023: Homogenous  ventilation/perfusion.  No mismatch noted.  Small MAC defect in the right lower lobe consistent with a cystic area seen on prior imaging.  Mild scattered air trapping. PFTs 11/29/2023: Normal study.  Normal spirometry, diffusion capacity and pulse extremity at rest and exercise.  Inspiratory flow volume curve all normal. Chest CT showed very mild fibrotic changes in the posterior lung bases and indeterminate pattern.  Posterior right lung base had a septated predominant gas-filled cystic lesion with smaller fluid also present in October 2024. => Recommend annual follow-up CPX with Stress Echo: Heart rate  response to exercise is blunted slightly.  Oxygenation saturations at rest and exercise were normal close to 100%.  Peak VO2 was 18 mL/KG/min and he achieved 5.1 METS.  There was evidence of impaired cardiac output echocardiographically there was ventricle septal mid apical and anteroapical ischemia.  By Doppler left ventricular filling pressures were normal at rest and with exercise but there was evidence of exercise-induced pulm hypertension with RVSP of 30 mmHg at rest and 65 mmHg with exercise assuming RA of 5 mmHg. Exercise 5:16 minutes -> 5.3 METS terminated due to dyspnea and leg distress.  Peak heart rate of 120 bpm (84% MPHR achieved.  EKG negative for ischemia Exercise echo was positive for myocardial ischemia.  EF remained 60% with rest and peak stress-60%.. Normal LV filling pressures at rest and with exercise.  Normal lung aeration with rest and exercise. 24-hour monitor showed unremarkable findings with single PACs and PVCs.  Most recent tests at [facility] Echo 10/10/2022: EF 55 to 60%.  No RWMA.  G1 DD.  Normal RVP.  Normal valves. CPX 12/14/2022: Exercised for 9 minutes.  Stopped because of leg fatigue and chest pain.  Reached 88% MPHR-127 bpm.  Peak VO2 20.9 mL/Kg/min (84%.  Peak predicted).  Normal peak VO2.  Severely elevated VE/VCO2 indicating excessive dead space ventilation.  Normal  VO2 with at ventilation threshold-66% corrected.  Normal ventilatory reserve. Normal functional capacity and compared to sedentary norms.  No clear cardiopulmonary limitation, but VE/VCO2 slope was severely elevated having cannot exclude elevated PA pressures during exercise. => Plan for Exercise Right Heart Cath Exercise Right Heart Cath: Normal resting hemodynamics.  Moderate increase in PA pressure during exercise not uncommon and noted subjects.  PCWP increases initially during exercise but then normalizes. => Started on sildenafil .. Cardiac Stress PET 06/07/2023: Normal LV perfusion with no evidence of ischemia or infarction.  Rest EF 63%, stress EF 61%.  No evidence of TID  Risk Assessment/Calculations:             Physical Exam:   VS:  BP 114/62   Pulse (!) 56   Ht 6' (1.829 m)   Wt 192 lb 12.8 oz (87.5 kg)   SpO2 95%   BMI 26.15 kg/m    Wt Readings from Last 3 Encounters:  05/22/24 192 lb 12.8 oz (87.5 kg)  04/11/24 191 lb 6.4 oz (86.8 kg)  02/19/24 188 lb (85.3 kg)    GEN: Well nourished, well developed in no acute distress; relatively healthy appearing. NECK: No JVD; No carotid bruits CARDIAC: Normal S1, S2; RRR, no murmurs, rubs, gallops RESPIRATORY:  Clear to auscultation without rales, wheezing or rhonchi ; nonlabored, good air movement. ABDOMEN: Soft, non-tender, non-distended EXTREMITIES:  No edema; No deformity      ASSESSMENT AND PLAN: .    Problem List Items Addressed This Visit       Cardiology Problems   CAD S/P & DES PCI -ostial PDA and D1 (Chronic)   Status post vessel PCI.  Now on aspirin  maintenance after stopping Plavix .      Relevant Medications   irbesartan  (AVAPRO ) 150 MG tablet   ranolazine  (RANEXA ) 1000 MG SR tablet   furosemide  (LASIX ) 20 MG tablet   Alirocumab  (PRALUENT ) 150 MG/ML SOAJ   Coronary artery disease involving native coronary artery of native heart with angina pectoris - Primary (Chronic)   Ongoing chest pain despite relook  cath showing widely patent LAD and PDA stents with FFR negative lesion in the LCx.  Stress PET  test last fall was also nonischemic. I do not think his chest pain is truly related to macrovascular disease. Continue with aspirin  81 mg daily, bisoprolol  2.5 mg daily along with irbesartan  which we will go to adjust to 75 mg twice daily (1/2 of 150 mg tablet) Continue Ranexa  1000 mg twice daily along with Praluent  150 mg or 2 weeks for lipids.  Likely plan in November will be for them to perform microvascular evaluation up at [facility].      Relevant Medications   irbesartan  (AVAPRO ) 150 MG tablet   ranolazine  (RANEXA ) 1000 MG SR tablet   furosemide  (LASIX ) 20 MG tablet   Alirocumab  (PRALUENT ) 150 MG/ML SOAJ   Essential (primary) hypertension (Chronic)   Blood pressure pretty well-controlled on current meds but with having some dizziness some elevated concern that he could have some hypotension we will therefore take his irbesartan  1/2 tablet twice daily      Relevant Medications   irbesartan  (AVAPRO ) 150 MG tablet   ranolazine  (RANEXA ) 1000 MG SR tablet   furosemide  (LASIX ) 20 MG tablet   Alirocumab  (PRALUENT ) 150 MG/ML SOAJ   Hyperlipidemia with target LDL less than 70 - statin intolerant (Chronic)   Lipids have been pretty well-controlled based on labs from October 2024.  Due for labs to be checked soon. 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  Tolerating well.      Relevant Medications   irbesartan  (AVAPRO ) 150 MG tablet   ranolazine  (RANEXA ) 1000 MG SR tablet   furosemide  (LASIX ) 20 MG tablet   Alirocumab  (PRALUENT ) 150 MG/ML SOAJ   Pulmonary hypertension, unspecified (HCC) (Chronic)   Minimal baseline pulm hypertension but exertional pulmonary retention noted on stress right heart cath.   No longer on Opsumit. Continue to maintain stable dry weight.  He is low bit up today but did not notice any worsening symptoms.  He should be about 187 to 189 lb (according to his home  weights) Continue Lasix  60 mg daily with additional 20 mg for weight gain more than 3 pounds a day. With him having some dizziness ongoing, we will adjust from irbesartan  150 mg daily to 1/2 tablet (75 mg) twice daily and continue 2.5 mg bisoprolol . Continue sildenafil  20 mg daily.       Relevant Medications   irbesartan  (AVAPRO ) 150 MG tablet   ranolazine  (RANEXA ) 1000 MG SR tablet   furosemide  (LASIX ) 20 MG tablet   Alirocumab  (PRALUENT ) 150 MG/ML SOAJ     Other   Dyspnea on exertion with chest tightness (Chronic)   Chronic dyspnea with no significant lung function abnormalities. 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 Cardiac etiology not confirmed. - Discussed potential benefits of narcotics for air hunger.  Will Refer to Pain Management Clinic      Relevant Orders   EKG 12-Lead (Completed)   Exertional chest pain (Chronic)   Persistent chest pain unresponsive to standard cardiac treatments. Differential includes microvascular spasm. Consider chronic pain management with narcotics. - Discussed risks and benefits of narcotics, including addiction potential and vasodilatory effects. - Shared decision-making on referral to pain management for narcotic therapy. - Refer to Pain Management Clinic for evaluation and potential narcotic therapy.      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  Now on Praluent  with well-controlled lipids.      Orthostatic dizziness (Chronic)   Convert dosing of ARB to irbesartan  75 mg twice daily (1/2 tablet twice daily) Encouraged adequate hydration      Other Visit Diagnoses  Chest pain, unspecified type       Relevant Orders   EKG 12-Lead (Completed)     Chronic chest pain       Relevant Orders   EKG 12-Lead (Completed)   Ambulatory referral to Pain Clinic              Follow-Up: Return in about 4 months (around 09/22/2024) for Routine follow up with me.  I spent 52 minutes in the care of EYOEL THROGMORTON today including  reviewing outside labs from PCP via [facility] (1 minute), reviewing studies (previous: Studies reviewed-5 minutes), reviewing outside studies (studies from [facility] reviewed-3 minutes), face to face time discussing treatment options (26 minutes), reviewing records from heart failure clinic and [facility] as well as my last note (6 minutes), 11 minutes dictating, and documenting in the encounter.      Signed, Alm MICAEL Clay, MD, MS Alm Clay, M.D., M.S. Interventional Cardiologist  [facility] Pager # [phone]      "

## 2024-05-22 NOTE — Patient Instructions (Addendum)
 Medication Instructions:    Decrease Avapro  ( irbesartan  ) to 1/2 tablet twice a day  *If you need a refill on your cardiac medications before your next appointment, please call your pharmacy*   Lab Work: No changes     Testing/Procedures: Not needed   Follow-Up: At [facility], you and your health needs are our priority.  As part of our continuing mission to provide you with exceptional heart care, we have created designated Provider Care Teams.  These Care Teams include your primary Cardiologist (physician) and Advanced Practice Providers (APPs -  Physician Assistants and Nurse Practitioners) who all work together to provide you with the care you need, when you need it.     Your next appointment:   4 month(s)  The format for your next appointment:   In Person  Provider:   Alm Clay, MD

## 2024-05-23 ENCOUNTER — Encounter: Payer: Self-pay | Admitting: Cardiology

## 2024-05-23 DIAGNOSIS — R42 Dizziness and giddiness: Secondary | ICD-10-CM | POA: Insufficient documentation

## 2024-05-23 NOTE — Assessment & Plan Note (Signed)
 Minimal baseline pulm hypertension but exertional pulmonary retention noted on stress right heart cath.   No longer on Opsumit. Continue to maintain stable dry weight.  He is low bit up today but did not notice any worsening symptoms.  He should be about 187 to 189 lb (according to his home weights) Continue Lasix  60 mg daily with additional 20 mg for weight gain more than 3 pounds a day. With him having some dizziness ongoing, we will adjust from irbesartan  150 mg daily to 1/2 tablet (75 mg) twice daily and continue 2.5 mg bisoprolol . Continue sildenafil  20 mg daily.

## 2024-05-23 NOTE — Assessment & Plan Note (Signed)
 Lipids have been pretty well-controlled based on labs from October 2024.  Due for labs to be checked soon. 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  Tolerating well.

## 2024-05-23 NOTE — Assessment & Plan Note (Signed)
 Blood pressure pretty well-controlled on current meds but with having some dizziness some elevated concern that he could have some hypotension we will therefore take his irbesartan  1/2 tablet twice daily

## 2024-05-23 NOTE — Assessment & Plan Note (Signed)
 Chronic dyspnea with no significant lung function abnormalities. 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 Cardiac etiology not confirmed. - Discussed potential benefits of narcotics for air hunger.  Will Refer to Pain Management Clinic

## 2024-05-23 NOTE — Assessment & Plan Note (Signed)
 Ongoing chest pain despite relook cath showing widely patent LAD and PDA stents with FFR negative lesion in the LCx.  Stress PET test last fall was also nonischemic. I do not think his chest pain is truly related to macrovascular disease. Continue with aspirin  81 mg daily, bisoprolol  2.5 mg daily along with irbesartan  which we will go to adjust to 75 mg twice daily (1/2 of 150 mg tablet) Continue Ranexa  1000 mg twice daily along with Praluent  150 mg or 2 weeks for lipids.  Likely plan in November will be for them to perform microvascular evaluation up at [facility].

## 2024-05-23 NOTE — Assessment & Plan Note (Addendum)
 Persistent chest pain unresponsive to standard cardiac treatments. Differential includes microvascular spasm. Consider chronic pain management with narcotics. - Discussed risks and benefits of narcotics, including addiction potential and vasodilatory effects. - Shared decision-making on referral to pain management for narcotic therapy. - Refer to Pain Management Clinic for evaluation and potential narcotic therapy.

## 2024-05-23 NOTE — Assessment & Plan Note (Signed)
 Status post vessel PCI.  Now on aspirin  maintenance after stopping Plavix .

## 2024-05-23 NOTE — Assessment & Plan Note (Addendum)
 Intolerant of just about every statin.  Now on Praluent  with well-controlled lipids.

## 2024-05-23 NOTE — Assessment & Plan Note (Signed)
 Convert dosing of ARB to irbesartan  75 mg twice daily (1/2 tablet twice daily) Encouraged adequate hydration

## 2024-05-27 ENCOUNTER — Other Ambulatory Visit: Payer: Self-pay

## 2024-05-27 NOTE — Patient Outreach (Unsigned)
 Complex Care Management   Visit Note  05/27/2024  Name:  Luis Guzman MRN: [id number] DOB: 09/13/1945  Situation: Referral received for Complex Care Management related to {Criteria:32550} I obtained verbal consent from {CHL AMB Patient/Caregiver:28184}.  Visit completed with {CHL AMB Patient/Caregiver:28184}  {VISIT LOCATION:32553}  Background:   Past Medical History:  Diagnosis Date   Allergy    Anemia    as infant history of   CAD S/P PTCA & DES PCI - for Progressive Angina 02/26/2019   Cath-PCI 02/26/2019: EF 55-65%. mCx 65% (FFR 0.82 - Med Rx). D2 85% - Wolverine Scoring Balloon PTCA (2.0 mm). Ost rPDA - 90% -Resolute Onyx 2.5 x 15 (2.6 mm). 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 lower back pain, s/p cervical neck surgerystiffness remains   Transfusion history    infant anemia    Assessment: Patient Reported Symptoms:  Cognitive        Neurological      HEENT        Cardiovascular      Respiratory      Endocrine      Gastrointestinal        Genitourinary      Integumentary      Musculoskeletal          Psychosocial            05/27/2024    PHQ2-9 Depression Screening   Little interest or pleasure in doing things    Feeling down, depressed, or hopeless    PHQ-2 - Total Score    Trouble falling or staying asleep, or sleeping too much    Feeling tired or having little energy    Poor appetite or overeating     Feeling bad about yourself - or that you are a failure or have let yourself or your family  down    Trouble concentrating on things, such as reading the newspaper or watching television    Moving or speaking so slowly that other people could have noticed.  Or the opposite - being so fidgety or restless that you have been moving around a lot more than usual    Thoughts that you would be better off dead, or hurting yourself in some way    PHQ2-9 Total Score    If you checked off any problems, how difficult have these problems made it for you to do your work, take care of things at home, or get along with other people    Depression Interventions/Treatment      There were no vitals filed for this visit.  Medications Reviewed Today     Reviewed by Karoline Lima, RN (Registered Nurse) on 05/27/24 at 1148  Med List Status: <None>   Medication Order Taking? Sig Documenting Provider Last Dose Status Informant  acetaminophen  (TYLENOL ) 500 MG tablet [id number]  Take 1,000 mg by mouth every 6 (six) hours as needed for moderate pain. [provider]  Active Self  albuterol  (VENTOLIN   HFA) 108 (90 Base) MCG/ACT inhaler [id number]  Inhale 2 puffs into the lungs every 6 (six) hours as needed for wheezing or shortness of breath. Micheal Wolm ORN, MD  Active   Alirocumab  (PRALUENT ) 150 MG/ML SOAJ [id number]  Inject 1 mL (150 mg total) into the skin every 14 (fourteen) days. Anner Alm ORN, MD  Active   aspirin  EC 81 MG tablet [id number]  Take 1 tablet (81 mg total) by mouth daily. Swallow whole. ( start after stopping clopidogrel  75 mg) Anner Alm ORN, MD  Active Self  bimatoprost (LUMIGAN) 0.03 % ophthalmic solution [id number]  Place 1 drop into both eyes at bedtime. [provider]  Active Self  bisoprolol  (ZEBETA ) 5 MG tablet [id number]  Take 0.5 tablets (2.5 mg total) by mouth daily. Anner Alm ORN, MD  Active   furosemide  (LASIX ) 20 MG tablet [id number]  Take 3 tablets (60 mg total) by mouth daily. May take an additional  60 mg if needed swelling  or Edema Anner Alm ORN, MD   Active   ibuprofen (ADVIL) 200 MG tablet [id number]  Take 400 mg by mouth every 6 (six) hours as needed for moderate pain. [provider]  Active Self  irbesartan  (AVAPRO ) 150 MG tablet [id number]  Take 0.5 tablets (75 mg total) by mouth daily. Anner Alm ORN, MD  Active   potassium chloride  (KLOR-CON  M) 10 MEQ tablet [id number]  Take 2 tablets (20 mEq total) by mouth daily. Anner Alm ORN, MD  Active   ranolazine  (RANEXA ) 1000 MG SR tablet [id number]  Take 1 tablet (1,000 mg total) by mouth in the morning and at bedtime. Anner Alm ORN, MD  Active   sertraline  (ZOLOFT ) 50 MG tablet [id number]  TAKE 1 TABLET BY MOUTH EVERY DAY Burchette, Wolm ORN, MD  Active   sildenafil  (REVATIO ) 20 MG tablet [id number]  Take 1 tablet (20 mg total) by mouth 3 (three) times daily. Anner Alm ORN, MD  Active   traZODone  (DESYREL ) 50 MG tablet [id number]  Take one to two tablets by mouth at night as needed for insomnia. Micheal Wolm ORN, MD  Active            Med Note [facility], Javayah Magaw N   Wed Sep 27, 2023 11:15 AM) Currently taking two as needed            Recommendation:   {RECOMMENDATONS:32554}  Follow Up Plan:   {FOLLOWUP:32559}  SIG ***

## 2024-05-28 ENCOUNTER — Other Ambulatory Visit (HOSPITAL_BASED_OUTPATIENT_CLINIC_OR_DEPARTMENT_OTHER): Payer: Self-pay

## 2024-05-28 MED ORDER — FLUZONE HIGH-DOSE 0.5 ML IM SUSY
0.5000 mL | PREFILLED_SYRINGE | Freq: Once | INTRAMUSCULAR | 0 refills | Status: AC
  Filled 2024-05-28: qty 0.5, 1d supply, fill #0

## 2024-05-28 NOTE — Patient Instructions (Signed)
 Thank you for allowing the Complex Care Management team to participate in your care. It was great speaking with you.  I hope you have a safe trip to [place] !  We will follow up on July 10, 2024 at 1000. We can easily reschedule this outreach if the [facility] team requires additional appointments that extend your visit in [place] . Please don't hesitate to contact me if you require assistance prior to our next outreach.    Jackson Acron [facility] Population Health RN Care Manager Direct Dial: [phone]  Fax: [phone] Website: [email or web address]

## 2024-06-05 ENCOUNTER — Other Ambulatory Visit: Payer: Self-pay | Admitting: Family Medicine

## 2024-06-14 ENCOUNTER — Encounter: Admitting: Family Medicine

## 2024-06-14 ENCOUNTER — Ambulatory Visit (INDEPENDENT_AMBULATORY_CARE_PROVIDER_SITE_OTHER): Admitting: Family Medicine

## 2024-06-14 VITALS — BP 120/70 | HR 58 | Temp 98.3°F | Ht 72.0 in | Wt 191.0 lb

## 2024-06-14 DIAGNOSIS — Z0000 Encounter for general adult medical examination without abnormal findings: Secondary | ICD-10-CM

## 2024-06-14 DIAGNOSIS — Z0001 Encounter for general adult medical examination with abnormal findings: Secondary | ICD-10-CM | POA: Diagnosis not present

## 2024-06-14 DIAGNOSIS — L570 Actinic keratosis: Secondary | ICD-10-CM | POA: Diagnosis not present

## 2024-06-14 NOTE — Patient Instructions (Addendum)
 SABRA

## 2024-06-14 NOTE — Progress Notes (Signed)
 Established Patient Office Visit  Subjective   Patient ID: Luis Guzman, male    DOB: [birth year]  Age: 78 y.o. MRN: [id number]  No chief complaint on file.   HPI    Luis Guzman is seen today for physical exam.  He has history of CAD, pulmonary hypertension, essential hypertension, past history of acute pancreatitis, hyperlipidemia.  He had ongoing issues with dyspnea and is followed closely by cardiology here and plans to go back next week to [facility] [place] in [place] .  They have planned right heart cath and further testing there.  He has had some ongoing hoarseness which he thinks may be related to the pulmonary hypertension.  He has a couple scaly spots on his scalp that have been treated previously with liquid nitrogen and is requesting repeat treatment if clinically indicated.  Usually wears a cap when going outdoors for sun protection.  Health maintenance reviewed:  -Aged out of further screening colonoscopy -Tetanus due 2027 -Prior hepatitis C screen negative -Flu vaccine already given -Pneumonia vaccine complete -Shingrix complete  Family history-significant for both parents having hypertension.  Sister with rheumatoid arthritis.  Another sister had a stroke age 80.  Social history-quit smoking 1970-with only about 5-10-pack-year history.  No regular alcohol use.  Patient is married.  Past Medical History:  Diagnosis Date   Allergy    Anemia    as infant history of   CAD S/P PTCA & DES PCI - for Progressive Angina 02/26/2019   Cath-PCI 02/26/2019: EF 55-65%. mCx 65% (FFR 0.82 - Med Rx). D2 85% - Wolverine Scoring Balloon PTCA (2.0 mm). Ost rPDA - 90% -Resolute Onyx 2.5 x 15 (2.6 mm). 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 lower back pain, s/p cervical neck surgerystiffness remains   Transfusion history    infant anemia   Past Surgical History:  Procedure Laterality Date   APPENDECTOMY  ~ 1959   BACK SURGERY     CARDIAC CATHETERIZATION     CERVICAL DISC ARTHROPLASTY N/A 07/30/2018   Procedure: Cervical five-six Cervical six-seven Artificial disc replacement;  Surgeon: Colon Shove, MD;  Location: [facility] OR;  Service: Neurosurgery;  Laterality: N/A;   CERVICAL LAMINECTOMY  1997   C 4 and C 5   CHOLECYSTECTOMY N/A 04/15/2014   Procedure: LAPAROSCOPIC CHOLECYSTECTOMY WITH INTRAOPERATIVE CHOLANGIOGRAM;  Surgeon: Krystal Spinner, MD;  Location: [facility] ORS;  Service: General;  Laterality: N/A;   COLONOSCOPY     CORONARY BALLOON ANGIOPLASTY N/A 02/26/2019   Procedure: CORONARY BALLOON ANGIOPLASTY;  Surgeon: Anner Alm LELON, MD;  Location: [facility] INVASIVE CV LAB;  Service: Cardiovascular -> scoring balloon PTCA (Wolverine 2.0 mm) of ostial D2 85% reducing to 20-30%.   CORONARY PRESSURE/FFR STUDY N/A 02/26/2019   Procedure: INTRAVASCULAR PRESSURE WIRE/FFR STUDY;  Surgeon: Anner Alm LELON, MD;  Location: [facility] INVASIVE CV LAB;  Service: Cardiovascular;; mCx ~65% - DFR 0.92, FR 0.82 - BORDERLINE--> MED Rx   CORONARY PRESSURE/FFR STUDY N/A 11/10/2021  Procedure: INTRAVASCULAR PRESSURE WIRE/FFR STUDY;  Surgeon: Anner Alm ORN, MD;  Location: [facility] INVASIVE CV LAB;  Service: Cardiovascular;  Laterality: N/A;   CORONARY STENT INTERVENTION N/A 02/26/2019   Procedure: CORONARY STENT INTERVENTION;  Surgeon: Anner Alm ORN, MD;  Location: [facility] INVASIVE CV LAB;; PCI ostial RPDA (initial attempt of PTCA only led to small local tear/dissection covered with stent) Resolute Onyx 2.5 mm x 15 mm (2.6 mm   CORONARY STENT INTERVENTION  N/A 07/05/2019   Procedure: CORONARY STENT INTERVENTION;  Surgeon: Anner Alm ORN, MD;  Location: [facility] INVASIVE CV LAB;  Service: Cardiovascular;; ostial D1 95% (restenosis of PTCA site) -> DES PCI RESOLUTE ONYX 2.25 mm x 50 mm (2.6 mm) positioned to avoid the true ostium.    ESOPHAGOGASTRODUODENOSCOPY N/A 03/13/2014   Procedure: ESOPHAGOGASTRODUODENOSCOPY (EGD);  Surgeon: Lesta JULIANNA Fitz, MD;  Location: [facility] ENDOSCOPY;  Service: Endoscopy;  Laterality: N/A;   EYE SURGERY     FRACTURE SURGERY     JOINT REPLACEMENT     LEFT HEART CATH AND CORONARY ANGIOGRAPHY N/A 02/26/2019   Procedure: LEFT HEART CATH AND CORONARY ANGIOGRAPHY;  Surgeon: Anner Alm ORN, MD;  Location: [facility] INVASIVE CV LAB;  EF 55-65%. mCx 65% (FFR 0.82 - Med Rx). D2 85% - Wolverine Scoring Balloon PTCA (2.0 mm). Ost rPDA - 90% -Resolute Onyx 2.5 x 15 (2.6 mm).    LEFT HEART CATH AND CORONARY ANGIOGRAPHY N/A 07/05/2019   Procedure: LEFT HEART CATH AND CORONARY ANGIOGRAPHY;  Surgeon: Anner Alm ORN, MD;  Location: [facility] INVASIVE CV LAB;; ostial D1 95% (restenosis of PTCA site) -> DES PCI.  PDA stent widely patent.  Mid CX stable 65% lesion.  Proximal RCA 40%.  Mid RCA 45%.  Normal EDP.   LEFT HEART CATH AND CORONARY ANGIOGRAPHY N/A 11/10/2021   Procedure: LEFT HEART CATH AND CORONARY ANGIOGRAPHY;  Surgeon: Anner Alm ORN, MD;  Location: [facility] INVASIVE CV LAB;  Service: CV:: : Stable mid LCx with 5% (RFR 0.95).  Stable proximal RCA 40% and mid RCA 55%.  D1 and RPDA stents widely patent (   NM MYOVIEW  LTD  10/20/2021   Horizontal ST depressions in V4 and V5, frequent exercise-induced PVCs.  Inferior patient defect improves with stress-consistent with artifact.  Positive EKG, negative perfusion imaging.  Read as moderate risk study because of exertional ectopy.  Normal EF.   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   RIGHT HEART CATH N/A 01/26/2023   Procedure: RIGHT HEART CATH;  Surgeon: Cherrie Toribio SAUNDERS, MD;  Location: [facility]  INVASIVE CV LAB;  Service: Cardiovascular;  Laterality: N/A;   SHOULDER ARTHROSCOPY Bilateral    SHOULDER ARTHROSCOPY W/ ROTATOR CUFF REPAIR Bilateral 2004-2009   left 2004, right 2009   SPINE SURGERY     TOTAL HIP ARTHROPLASTY Left 03/04/2020   Procedure: TOTAL HIP ARTHROPLASTY ANTERIOR APPROACH;  Surgeon: Melodi Lerner, MD;  Location: [facility] ORS;  Service: Orthopedics;  Laterality: Left;    TOTAL HIP ARTHROPLASTY Right 09/30/2020   Procedure: TOTAL HIP ARTHROPLASTY ANTERIOR APPROACH;  Surgeon: Melodi Lerner, MD;  Location: [facility] ORS;  Service: Orthopedics;  Laterality: Right;    TRANSTHORACIC ECHOCARDIOGRAM  02/2020; 10/01/2020   a) EF 55 to 60%.  GR 1 DD.  No R WMA.  Trivial MR.  Otherwise normal.; b) EF 55 to 60%.  Normal LV function and no wall motion abnormality.  Normal RV.  Normal valves..   TRANSTHORACIC ECHOCARDIOGRAM  10/18/2021   Normal LV function with a EF  of 65%.  No RWMA.  GR 1 DD.  Normal valves.  Mild concentric LVH.  Mild MR and mild-moderate AI.   VASECTOMY     WISDOM TOOTH EXTRACTION      reports that he quit smoking about 55 years ago. His smoking use included cigarettes. He started smoking about 60 years ago. He has a 5 pack-year smoking history. He has never used smokeless tobacco. He reports that he does not currently use alcohol. He reports that he does not use drugs. family history includes Arthritis in his sister; Heart disease in his mother; Hypertension in his father and mother; Stroke (age of onset: 52) in his sister.    Review of Systems  Constitutional:  Negative for malaise/fatigue.  Eyes:  Negative for blurred vision.  Respiratory:  Positive for shortness of breath. Negative for hemoptysis.   Cardiovascular:  Negative for chest pain.  Gastrointestinal:  Negative for abdominal pain.  Neurological:  Negative for dizziness, weakness and headaches.      Objective:     BP 120/70   Pulse (!) 58   Temp 98.3 F (36.8 C) (Oral)   Ht 6' (1.829 m)    Wt 191 lb (86.6 kg)   SpO2 96%   BMI 25.90 kg/m  BP Readings from Last 3 Encounters:  06/14/24 120/70  05/22/24 114/62  04/11/24 (!) 144/78   Wt Readings from Last 3 Encounters:  06/14/24 191 lb (86.6 kg)  05/22/24 192 lb 12.8 oz (87.5 kg)  04/11/24 191 lb 6.4 oz (86.8 kg)      Physical Exam Vitals reviewed.  Constitutional:      General: He is not in acute distress.    Appearance: He is not ill-appearing.  HENT:     Right Ear: Tympanic membrane normal.     Left Ear: Tympanic membrane normal.     Mouth/Throat:     Mouth: Mucous membranes are moist.     Pharynx: Oropharynx is clear.  Cardiovascular:     Rate and Rhythm: Normal rate and regular rhythm.  Pulmonary:     Effort: Pulmonary effort is normal.  Abdominal:     General: There is no distension.     Palpations: Abdomen is soft.     Tenderness: There is no abdominal tenderness. There is no guarding or rebound.  Musculoskeletal:     Cervical back: Neck supple.     Right lower leg: No edema.     Left lower leg: No edema.  Lymphadenopathy:     Cervical: No cervical adenopathy.  Skin:    Comments: He has 3 slightly raised whitish colored hyperkeratotic areas on his parietal scalp consistent with actinic keratoses.  No ulceration.  No nodular changes.  Neurological:     General: No focal deficit present.     Mental Status: He is alert.      No results found for any visits on 06/14/24.  Last CBC Lab Results  Component Value Date   WBC 8.3 06/13/2023   HGB 15.1 06/13/2023   HCT 45.1 06/13/2023   MCV 94.3 06/13/2023   MCH 30.8 01/18/2023   RDW 13.0 06/13/2023   PLT 246.0 06/13/2023   Last metabolic panel Lab Results  Component Value Date   GLUCOSE 114 (H) 09/20/2023   NA 137 09/20/2023   K 4.7 09/20/2023   CL 103 09/20/2023   CO2 25 09/20/2023   BUN 14 09/20/2023   CREATININE 1.11 09/20/2023   GFRNONAA >60 09/20/2023   CALCIUM  9.7 09/20/2023  PHOS 1.6 (L) 09/30/2020   PROT 6.8 06/13/2023    ALBUMIN 4.2 06/13/2023   LABGLOB 2.0 11/14/2022   AGRATIO 2.0 11/14/2022   BILITOT 0.5 06/13/2023   ALKPHOS 113 06/13/2023   AST 24 06/13/2023   ALT 21 06/13/2023   ANIONGAP 9 09/20/2023   Last lipids Lab Results  Component Value Date   CHOL 130 06/13/2023   HDL 40.60 06/13/2023   LDLCALC 52 06/13/2023   LDLDIRECT 129.0 08/19/2016   TRIG 188.0 (H) 06/13/2023   CHOLHDL 3 06/13/2023   Last hemoglobin A1c Lab Results  Component Value Date   HGBA1C 5.8 06/13/2023      The 10-year ASCVD risk score (Arnett DK, et al., 2019) is: 27.9%    Assessment & Plan:   #1 physical exam.  Patient has chronic problems as above.  He has had ongoing dyspnea with diagnosis of pulmonary hypertension and has planned follow-up evaluation at [facility] in [place] next week.  Planned right heart catheterization.  Vaccines up-to-date including influenza and pneumonia vaccine.  Aged out of further colonoscopy screening.  He has had multiple recent labs through other clinics and none indicated at this time.  He had LDL/lipid panel done last April.  History of elevated PSA followed by urology with negative prior biopsies  #2 actinic keratoses involving parietal scalp.  Discussed risk and benefits of liquid nitrogen including risk of blistering, low risk of infection, low risk of scarring and patient consented.  We treated 3 separate lesions and patient tolerated well.  Continue good sun protection.  Follow-up if lesions not resolving  Wolm Scarlet, MD

## 2024-06-19 NOTE — Progress Notes (Addendum)
 REASON FOR VISIT I am seeing Luis Guzman after his cardiac catheterization procedure (coronary angiography, intracoronary physiologic testing, right heart catheterization (rest and exercise).  ASSESSMENT / PLAN   Luis Guzman had an uneventful procedure earlier today (2 hours duration).  There was some right radial artery bleeding which has resolved.  His right forearm is splinted. There is soft ecchymosis on inspection of the region.  At this time, the results are only preliminary.  I have not yet had a chance to review the data in detail with Dr. Amelie.  I will plan on seeing Luis Guzman again tomorrow at 12:00pm on [address].  Lamar Coffee, M.D. 06/19/2024

## 2024-06-20 NOTE — Consults (Addendum)
 I am seeing Luis Guzman again after yesterday's RHC, coronary angiogram, and intracoronary physiologic testing.  I reviewed the findings with Dr. Nobie Larger, who did the procedure.   This morning, Luis Guzman  right wrist region looks fine.  There is localized soft purplish ecchymosis, but no hematoma at this time.  No pain or any other symptoms.    Overall, the findings suggest that elevated intracardiac pressures and diastolic dysfunction are major contributing factors to Luis Guzman impaired exercise capacity and symptoms (HFpEF).  Ischemia is likely a contributing factor due to a combination of atherosclerosis, myocardial bridging, and epicardial endothelial dysfunction.  I have recommended the following changes to his medical regimen:    1)   Start an SGLT2 inhibitor (dapagliflozin or empagliflozin 10 mg daily) 2)   Regular BP checks.  If BP is not well-controlled (<130/80), increase irbesartan  to 150 mg bid.   3)   Wean and discontinue sildenafil  over 2 days 4)   After discontinuation of sildenafil , restart isosorbide  mononitrate at 30 mg daily 5)   Use sl NTG as needed, including prophylactically (eg. prior to a walk).  6)   Wean and discontinue bisoprolol  over 2 days 7)   Consider adding eplerenone or spironolactone, 25 mg daily later on 8)   Continue ranolazine  for now 9)   Continue furosemide , aspirin , and alirocumab    I went over these recommendations with Luis Guzman and his wife.  They are in agreement.  I have [facility] in [place], [place] for empagliflozin and Imdur .  Luis Guzman has been asked to follow up with Dr. Anner, his cardiologist in [place] , to assess his clinical response to the above changes and modify if necessary.   Final Impressions from Cath report (06/20/24) pasted below (Dr. RONAL Larger, Interventionalist):

## 2024-06-24 ENCOUNTER — Encounter: Payer: Self-pay | Admitting: Cardiology

## 2024-06-24 NOTE — Telephone Encounter (Signed)
 I was able to review the studies.  Unfortunately when they did his heart catheter.  Blood pressure was really really high and best not usually the case down here.  That may have thrown off some of the numbers but we will see how things go with the changes made.

## 2024-06-25 ENCOUNTER — Encounter: Admitting: Family Medicine

## 2024-06-26 ENCOUNTER — Encounter: Payer: Self-pay | Admitting: Physical Medicine & Rehabilitation

## 2024-07-03 ENCOUNTER — Encounter: Payer: Self-pay | Admitting: Cardiology

## 2024-07-06 NOTE — Telephone Encounter (Signed)
 I think that is fine.  I have never had to do this before.

## 2024-07-10 ENCOUNTER — Telehealth: Payer: Self-pay

## 2024-07-17 NOTE — Progress Notes (Signed)
 Luis Guzman                                          MRN: [id number]   07/17/2024   The [facility]I Quality Team Specialist reviewed this patient medical record for the purposes of chart review for care gap closure. The following were reviewed: chart review for care gap closure-kidney health evaluation for diabetes:eGFR  and uACR.    VBCI Quality Team

## 2024-07-21 ENCOUNTER — Other Ambulatory Visit: Payer: Self-pay | Admitting: Family Medicine

## 2024-07-26 ENCOUNTER — Encounter: Attending: Physical Medicine & Rehabilitation | Admitting: Physical Medicine & Rehabilitation

## 2024-07-26 ENCOUNTER — Encounter: Payer: Self-pay | Admitting: Physical Medicine & Rehabilitation

## 2024-07-26 VITALS — BP 153/83 | HR 67 | Ht 72.0 in | Wt 192.0 lb

## 2024-07-26 DIAGNOSIS — I2089 Other forms of angina pectoris: Secondary | ICD-10-CM

## 2024-07-26 DIAGNOSIS — I25119 Atherosclerotic heart disease of native coronary artery with unspecified angina pectoris: Secondary | ICD-10-CM

## 2024-07-26 NOTE — Progress Notes (Addendum)
 "  Subjective:    Patient ID: Luis Guzman, male    DOB: 03-22-1946, 78 y.o.   MRN: [id number]  HPI   Discussed the use of AI scribe software for clinical note transcription with the patient, who gave verbal consent to proceed.    Luis Guzman is a 78 y.o. 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   They are presenting to PM&R clinic as a new patient for pain management evaluation. They were referred by Dr. Anner for treatment of chest pain.    The patient reports persistent chest pain, pressure, and shortness of breath for approximately six years, with onset prior to COVID. The pain is described as deep in the center of the chest, sometimes radiating to the neck and jaw. Pain intensity is variable; it is occasionally mild enough to tolerate, while at other times it is severe, requiring up to three nitroglycerin  without complete resolution. He experiences exacerbations in the morning, leading to dyspnea and increased pressure after showering. Symptoms are not consistently related to activity and can occur at rest. The frequency of episodes varies, with three episodes in the last week, but he can go weeks without an episode. He notes an increasing intensity of symptoms since the beginning of the year.  Pt reports he had past past cardiac interventions began with a referral to Dr. Anner by his PCP, after which he underwent stent placement for an 85% blockage and balloon angioplasty for an 80% blockage, followed by cardiac rehab. A second stent was placed in November. Despite these interventions, he has experienced  continued chest pain and pressure.  He reports he had a cardiac workup includes a visit to the [facility] in April 2025, with a right heart catheterization and a regular heart catheterization performed in early November 2025. 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 He reports a spasm provocation test at the [facility] replicated his pain with medication introduction into the microvessels, suggesting coronary spasms. Cardiology notes from Dr. Anner and Dr. Mauri indicate an extensive workup with similar findings.   Current medications include Zoloft  for mood, Lasix  60 mg daily, potassium, nitroglycerin  PRN. He has a history of a sildenafil  trial which did not provide relief and was discontinued in November 2025. Ibuprofen is listed in his medication history but is not currently used. He uses Tylenol  for pain but reports no benefit for his chest pain. He denies any use of Gabapentin, Lyrica, amitriptyline, nortriptyline, Cymbalta, or tramadol . He has a history of oxycodone  use years ago for neck pain, which resolved after artificial disc placement.  Regarding his rehab history, he has completed cardiac rehab twice and was discharged from the last program due to ongoing chest pain..    Medications tried: Nsaids - Contraindicated  Tylenol   - Doesn't help  Opiates Oxycodone - when he had disc issues  Gabapentin / Lyrica  Denies  TCAs  Denies  SNRIs  - Denies   Addendum tramadol  minimal benefit in past  Other treatments: Cardiac rehab x2   Prior UDS results: No results found for: LABOPIA, COCAINSCRNUR, LABBENZ, AMPHETMU, THCU, LABBARB    Pain  Inventory Average Pain 6 Pain Right Now 5 My pain is intermittent and chest pressure  In the last 24 hours, has pain interfered with the following? General activity 5 Relation with others 5 Enjoyment of life 5 What TIME of day is your pain  at its worst? daytime and evening Sleep (in general) Fair  Pain is worse with: some activites Pain improves with: other Relief from Meds: 1  walk without assistance ability to climb steps?  yes do you drive?  yes  not employed: date last employed 07/2020 retired I need assistance with the following:  meal prep and household duties  dizziness confusion  Any changes since last visit?  no  Any changes since last visit?  no    Family History  Problem Relation Age of Onset   Hypertension Mother    Heart disease Mother    Hypertension Father    Arthritis Sister        rheumatoid   Stroke Sister 27   Social History   Socioeconomic History   Marital status: Married    Spouse name: Not on file   Number of children: Not on file   Years of education: 16   Highest education level: Bachelor's degree (e.g., BA, AB, BS)  Occupational History   Not on file  Tobacco Use   Smoking status: Former    Current packs/day: 0.00    Average packs/day: 1 pack/day for 5.0 years (5.0 ttl pk-yrs)    Types: Cigarettes    Start date: 10/18/1963    Quit date: 10/17/1968    Years since quitting: 55.8   Smokeless tobacco: Never  Vaping Use   Vaping status: Never Used  Substance and Sexual Activity   Alcohol use: Not Currently    Comment: quit drinking ~ 10/1968   Drug use: No   Sexual activity: Not on file  Other Topics Concern   Not on file  Social History Narrative   Not on file   Social Drivers of Health   Tobacco Use: Medium Risk (07/26/2024)   Patient History    Smoking Tobacco Use: Former    Smokeless Tobacco Use: Never    Passive Exposure: Not on Actuary Strain: Low Risk (06/14/2024)   Overall Financial Resource Strain (CARDIA)    Difficulty of Paying Living Expenses: Not hard at all  Food Insecurity: No Food Insecurity (06/14/2024)   Epic    Worried About Programme Researcher, Broadcasting/film/video in the Last Year: Never true    Ran Out of Food in the Last Year: Never true   Transportation Needs: No Transportation Needs (06/14/2024)   Epic    Lack of Transportation (Medical): No    Lack of Transportation (Non-Medical): No  Physical Activity: Inactive (06/14/2024)   Exercise Vital Sign    Days of Exercise per Week: 0 days    Minutes of Exercise per Session: Not on file  Stress: Stress Concern Present (06/14/2024)   [employer] of Occupational Health - Occupational Stress Questionnaire    Feeling of Stress: Very much  Social Connections: Socially Integrated (06/14/2024)   Social Connection and Isolation Panel    Frequency of Communication with Friends and Family: More than three times a week    Frequency of Social Gatherings with Friends and Family: Once a week    Attends Religious Services: 1 to 4 times per year    Active Member of [employer] or Organizations: Yes    Attends Banker Meetings: More than 4 times  per year    Marital Status: Married  Depression (PHQ2-9): Low Risk (07/26/2024)   Depression (PHQ2-9)    PHQ-2 Score: 0  Alcohol Screen: Low Risk (12/21/2023)   Alcohol Screen    Last Alcohol Screening Score (AUDIT): 0  Housing: Low Risk (06/14/2024)   Epic    Unable to Pay for Housing in the Last Year: No    Number of Times Moved in the Last Year: 0    Homeless in the Last Year: No  Utilities: Not At Risk (12/27/2023)   AHC Utilities    Threatened with loss of utilities: No  Health Literacy: Adequate Health Literacy (12/27/2023)   B1300 Health Literacy    Frequency of need for help with medical instructions: Never   Past Surgical History:  Procedure Laterality Date   APPENDECTOMY  ~ 1959   BACK SURGERY     CARDIAC CATHETERIZATION     CERVICAL DISC ARTHROPLASTY N/A 07/30/2018   Procedure: Cervical five-six Cervical six-seven Artificial disc replacement;  Surgeon: Colon Shove, MD;  Location: [facility] OR;  Service: Neurosurgery;  Laterality: N/A;   CERVICAL LAMINECTOMY  1997   C 4 and C 5   CHOLECYSTECTOMY N/A 04/15/2014    Procedure: LAPAROSCOPIC CHOLECYSTECTOMY WITH INTRAOPERATIVE CHOLANGIOGRAM;  Surgeon: Krystal Spinner, MD;  Location: [facility] ORS;  Service: General;  Laterality: N/A;   COLONOSCOPY     CORONARY BALLOON ANGIOPLASTY N/A 02/26/2019   Procedure: CORONARY BALLOON ANGIOPLASTY;  Surgeon: Anner Alm ORN, MD;  Location: [facility] INVASIVE CV LAB;  Service: Cardiovascular -> scoring balloon PTCA (Wolverine 2.0 mm) of ostial D2 85% reducing to 20-30%.   CORONARY PRESSURE/FFR STUDY N/A 02/26/2019   Procedure: INTRAVASCULAR PRESSURE WIRE/FFR STUDY;  Surgeon: Anner Alm ORN, MD;  Location: [facility] INVASIVE CV LAB;  Service: Cardiovascular;; mCx ~65% - DFR 0.92, FR 0.82 - BORDERLINE--> MED Rx   CORONARY PRESSURE/FFR STUDY N/A 11/10/2021   Procedure: INTRAVASCULAR PRESSURE WIRE/FFR STUDY;  Surgeon: Anner Alm ORN, MD;  Location: [facility] INVASIVE CV LAB;  Service: Cardiovascular;  Laterality: N/A;   CORONARY STENT INTERVENTION N/A 02/26/2019   Procedure: CORONARY STENT INTERVENTION;  Surgeon: Anner Alm ORN, MD;  Location: [facility] INVASIVE CV LAB;; PCI ostial RPDA (initial attempt of PTCA only led to small local tear/dissection covered with stent) Resolute Onyx 2.5 mm x 15 mm (2.6 mm   CORONARY STENT INTERVENTION N/A 07/05/2019   Procedure: CORONARY STENT INTERVENTION;  Surgeon: Anner Alm ORN, MD;  Location: [facility] INVASIVE CV LAB;  Service: Cardiovascular;; ostial D1 95% (restenosis of PTCA site) -> DES PCI RESOLUTE ONYX 2.25 mm x 50 mm (2.6 mm) positioned to avoid the true ostium.    ESOPHAGOGASTRODUODENOSCOPY N/A 03/13/2014   Procedure: ESOPHAGOGASTRODUODENOSCOPY (EGD);  Surgeon: Lesta JULIANNA Fitz, MD;  Location: [facility] ENDOSCOPY;  Service: Endoscopy;  Laterality: N/A;   EYE SURGERY     FRACTURE SURGERY     JOINT REPLACEMENT     LEFT HEART CATH AND CORONARY ANGIOGRAPHY N/A 02/26/2019   Procedure: LEFT HEART CATH AND CORONARY ANGIOGRAPHY;  Surgeon: Anner Alm ORN, MD;  Location: [facility] INVASIVE CV LAB;  EF 55-65%. mCx 65% (FFR 0.82 - Med Rx). D2  85% - Wolverine Scoring Balloon PTCA (2.0 mm). Ost rPDA - 90% -Resolute Onyx 2.5 x 15 (2.6 mm).    LEFT HEART CATH AND CORONARY ANGIOGRAPHY N/A 07/05/2019   Procedure: LEFT HEART CATH AND CORONARY ANGIOGRAPHY;  Surgeon: Anner Alm ORN, MD;  Location: [facility] INVASIVE CV LAB;; ostial D1 95% (restenosis of PTCA site) -> DES PCI.  PDA stent widely patent.  Mid CX stable 65% lesion.  Proximal RCA 40%.  Mid RCA 45%.  Normal EDP.   LEFT HEART CATH AND CORONARY ANGIOGRAPHY N/A 11/10/2021   Procedure: LEFT HEART CATH AND CORONARY ANGIOGRAPHY;  Surgeon: Anner Alm ORN, MD;  Location: [facility] INVASIVE CV LAB;  Service: CV:: : Stable mid LCx with 5% (RFR 0.95).  Stable proximal RCA 40% and mid RCA 55%.  D1 and RPDA stents widely patent (   NM MYOVIEW  LTD  10/20/2021   Horizontal ST depressions in V4 and V5, frequent exercise-induced PVCs.  Inferior patient defect improves with stress-consistent with artifact.  Positive EKG, negative perfusion imaging.  Read as moderate risk study because of exertional ectopy.  Normal EF.   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   RIGHT HEART CATH N/A 01/26/2023   Procedure: RIGHT HEART CATH;  Surgeon: Cherrie Toribio SAUNDERS, MD;  Location: [facility] INVASIVE CV LAB;  Service: Cardiovascular;  Laterality: N/A;   SHOULDER ARTHROSCOPY Bilateral    SHOULDER ARTHROSCOPY W/ ROTATOR CUFF REPAIR Bilateral 2004-2009   left 2004, right 2009   SPINE SURGERY     TOTAL HIP ARTHROPLASTY Left 03/04/2020   Procedure: TOTAL HIP ARTHROPLASTY ANTERIOR APPROACH;  Surgeon: Melodi Lerner, MD;  Location: [facility] ORS;  Service: Orthopedics;  Laterality: Left;    TOTAL HIP ARTHROPLASTY Right 09/30/2020   Procedure: TOTAL HIP ARTHROPLASTY ANTERIOR APPROACH;  Surgeon: Melodi Lerner, MD;  Location: [facility] ORS;  Service: Orthopedics;  Laterality: Right;    TRANSTHORACIC ECHOCARDIOGRAM  02/2020; 10/01/2020   a) EF 55 to 60%.  GR 1 DD.  No R WMA.  Trivial MR.  Otherwise normal.; b) EF 55 to 60%.   Normal LV function and no wall motion abnormality.  Normal RV.  Normal valves..   TRANSTHORACIC ECHOCARDIOGRAM  10/18/2021   Normal LV function with a EF of 65%.  No RWMA.  GR 1 DD.  Normal valves.  Mild concentric LVH.  Mild MR and mild-moderate AI.   VASECTOMY     WISDOM TOOTH EXTRACTION     Past Medical History:  Diagnosis Date   Allergy    Anemia    as infant history of   CAD S/P PTCA & DES PCI - for Progressive Angina 02/26/2019   Cath-PCI 02/26/2019: EF 55-65%. mCx 65% (FFR 0.82 - Med Rx). D2 85% - Wolverine Scoring Balloon PTCA (2.0 mm). Ost rPDA - 90% -Resolute Onyx 2.5 x 15 (2.6 mm). 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 lower back pain, s/p cervical neck surgerystiffness remains   Transfusion history    infant anemia   BP (!) 153/83   Pulse 67   Ht 6' (1.829 m)   Wt 192 lb (87.1 kg)   SpO2 98%   BMI 26.04 kg/m   Opioid Risk Score:   Fall Risk Score:  `1  Depression screen [facility] 2/9     07/26/2024    3:12  PM 05/27/2024   12:14 PM 04/10/2024   11:31 AM 12/27/2023   11:45 AM 12/21/2023    9:05 AM 12/21/2023    8:31 AM 03/14/2023    9:35 AM  Depression screen PHQ 2/9  Decreased Interest 0 0 0 0 0 0 0  Down, Depressed, Hopeless 0 0 0 0 0 0 0  PHQ - 2 Score 0 0 0 0 0 0 0  Altered sleeping 0   0  0 1  Tired, decreased energy 0   0  1 1  Change in appetite 0   0  0 0  Feeling bad or failure about yourself  0   0  0 0  Trouble concentrating 0   0  0 0  Moving slowly or fidgety/restless 0   0  0 0  Suicidal thoughts 0   0   0 0  PHQ-9 Score 0   0   1  2   Difficult doing work/chores    Not difficult at all  Not difficult at all Not difficult at all     Data saved with a previous flowsheet row definition     Review of Systems  Constitutional:  Positive for appetite change and unexpected weight change.  Respiratory:  Positive for cough and shortness of breath.   Cardiovascular:  Positive for chest pain.  Neurological:  Positive for dizziness.  Psychiatric/Behavioral:  Positive for confusion.   All other systems reviewed and are negative.      Objective:   Physical Exam  Gen: no distress, normal appearing HEENT: oral mucosa pink and moist, NCAT Chest: normal effort, normal rate of breathing Abd: soft, non-distended Ext: no edema Psych: pleasant, normal affect Skin: intact Neuro: Alert and awake, follows commands, cranial nerves II through XII grossly intact, normal speech and language RUE: 5/5 Deltoid, 5/5 Biceps, 5/5 Triceps, 5/5 Wrist Ext, 5/5 Grip LUE: 5/5 Deltoid, 5/5 Biceps, 5/5 Triceps, 5/5 Wrist Ext, 5/5 Grip RLE: HF 5/5, KE 5/5, ADF 5/5, APF 5/5 LLE: HF 5/5, KE 5/5, ADF 5/5, APF 5/5 Sensory exam normal for light touch and pain in all 4 limbs. No limb ataxia or cerebellar signs. No abnormal tone appreciated.  No abnormal tone noted Musculoskeletal:  Spurling's negative, SLR negative No cervical, thoracic or lumbar tenderness noted No chest wall tenderness noted No pain with ROM of bilateral arms No significant pain with flexion and extension of the back      Assessment & Plan:   Assessment - Persistent chest pain. Suspect cardiac in origin.  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 Symptoms continue despite extensive cardiac workup and medical management.  Plan - Referral to  [place] [facility] for evaluation of interventional pain management options, including consideration of spinal cord stimulator trial or possibly Stellate Ganglion Block (SGB) - Discussed options for local versus [facility] pain Institute, patient prefers local options if possible due to wife's caregiving responsibilities. - Patient will schedule follow-up appointment in 2 months to assess progress.   Addendum I returned the patients call on 08/21/2024 in response to a secure message. The patient inquired about the possibility of being prescribed a stronger pain medication, potentially an opioid, to assist with pain management. He expressed a desire to defer any invasive procedures at this time. The patient stated that he would use pain medication infrequently and only during  episodes of severe pain.  Based on the discussion, the patient may be an appropriate candidate for as-needed opioid therapy. However, I discussed with the patient that our clinic primarily focuses on musculoskeletal conditions.  If pain medication were to be initiated, I think hydrocodone /acetaminophen  (Norco) 5 mg, to be taken twice daily as needed, would be an appropriate initial option. "

## 2024-08-19 ENCOUNTER — Encounter: Payer: Self-pay | Admitting: Physical Medicine & Rehabilitation

## 2024-09-23 ENCOUNTER — Ambulatory Visit: Admitting: Cardiology

## 2024-09-24 ENCOUNTER — Encounter: Admitting: Physical Medicine & Rehabilitation
